# Patient Record
Sex: Female | Born: 1935 | Race: Black or African American | Hispanic: No | Smoking: Former smoker
Health system: Southern US, Community
[De-identification: ages and names within clinical notes are randomized; demographics above are authoritative.]

## PROBLEM LIST (undated history)

## (undated) DIAGNOSIS — I10 Essential (primary) hypertension: Secondary | ICD-10-CM

## (undated) DIAGNOSIS — N6001 Solitary cyst of right breast: Secondary | ICD-10-CM

## (undated) DIAGNOSIS — R011 Cardiac murmur, unspecified: Secondary | ICD-10-CM

## (undated) DIAGNOSIS — K4090 Unilateral inguinal hernia, without obstruction or gangrene, not specified as recurrent: Secondary | ICD-10-CM

## (undated) DIAGNOSIS — M48061 Spinal stenosis, lumbar region without neurogenic claudication: Secondary | ICD-10-CM

## (undated) DIAGNOSIS — I509 Heart failure, unspecified: Secondary | ICD-10-CM

## (undated) DIAGNOSIS — Z9049 Acquired absence of other specified parts of digestive tract: Secondary | ICD-10-CM

## (undated) DIAGNOSIS — R269 Unspecified abnormalities of gait and mobility: Secondary | ICD-10-CM

## (undated) DIAGNOSIS — Z9889 Other specified postprocedural states: Secondary | ICD-10-CM

## (undated) DIAGNOSIS — Z90710 Acquired absence of both cervix and uterus: Secondary | ICD-10-CM

## (undated) DIAGNOSIS — M1990 Unspecified osteoarthritis, unspecified site: Secondary | ICD-10-CM

## (undated) DIAGNOSIS — E785 Hyperlipidemia, unspecified: Secondary | ICD-10-CM

## (undated) DIAGNOSIS — G2581 Restless legs syndrome: Secondary | ICD-10-CM

## (undated) HISTORY — DX: Hyperlipidemia, unspecified: E78.5

## (undated) HISTORY — DX: Unilateral inguinal hernia, without obstruction or gangrene, not specified as recurrent: K40.90

## (undated) HISTORY — DX: Restless legs syndrome: G25.81

## (undated) HISTORY — PX: PEG TUBE REMOVAL: SHX2187

## (undated) HISTORY — PX: TRACHEOSTOMY: SUR1362

## (undated) HISTORY — PX: BREAST CYST EXCISION: SHX579

## (undated) HISTORY — PX: APPENDECTOMY: SHX54

## (undated) HISTORY — DX: Acquired absence of both cervix and uterus: Z90.710

## (undated) HISTORY — DX: Essential (primary) hypertension: I10

## (undated) HISTORY — DX: Other specified postprocedural states: Z98.890

## (undated) HISTORY — PX: ABDOMINAL HYSTERECTOMY: SHX81

## (undated) HISTORY — DX: Acquired absence of other specified parts of digestive tract: Z90.49

## (undated) HISTORY — DX: Unspecified abnormalities of gait and mobility: R26.9

## (undated) HISTORY — DX: Solitary cyst of right breast: N60.01

## (undated) HISTORY — PX: TRACHEOSTOMY CLOSURE: SHX458

## (undated) HISTORY — PX: HERNIA REPAIR: SHX51

## (undated) NOTE — *Deleted (*Deleted)
[facility]   [phone] Fax:(336) [phone]   Clinic Follow up Note   Patient Care Team: Elige Radon, MD as PCP - General (Internal Medicine) Chilton Greathouse, MD as Consulting Physician (Pulmonary Disease) Elinor Parkinson, DPM as Consulting Physician (Podiatry) Eldred Manges, MD as Consulting Physician (Orthopedic Surgery) Radonna Ricker, RN as Oncology Nurse Navigator Pollyann Samples, NP as Nurse Practitioner (Nurse Practitioner) [name], MD as Consulting Physician (Oncology)  Date of Service:  12/12/2019  CHIEF COMPLAINT: ***  SUMMARY OF ONCOLOGIC HISTORY: Oncology History   No history exists.     INTERVAL HISTORY: *** Kristi Alexander is here for a follow up. She presents to the clinic alone.    REVIEW OF SYSTEMS:  *** Constitutional: Denies fevers, chills or abnormal weight loss Eyes: Denies blurriness of vision Ears, nose, mouth, throat, and face: Denies mucositis or sore throat Respiratory: Denies cough, dyspnea or wheezes Cardiovascular: Denies palpitation, chest discomfort or lower extremity swelling Gastrointestinal:  Denies nausea, heartburn or change in bowel habits Skin: Denies abnormal skin rashes Lymphatics: Denies new lymphadenopathy or easy bruising Neurological:Denies numbness, tingling or new weaknesses Behavioral/Psych: Mood is stable, no new changes  All other systems were reviewed with the patient and are negative.  MEDICAL HISTORY:  Past Medical History:  Diagnosis Date  . Abnormality of gait 11/22/2014  . Acute exacerbation of CHF (congestive heart failure) (HCC) 06/23/2018  . Arthritis   . Cyst of breast, right, benign solitary   . Dyslipidemia   . H/O: hysterectomy   . Heart murmur   . History of appendectomy   . History of lumbosacral spine surgery   . Hypertension   . Inguinal hernia   . Lumbar stenosis   . Restless leg syndrome     SURGICAL HISTORY: Past Surgical History:  Procedure Laterality  Date  . ABDOMINAL HYSTERECTOMY    . APPENDECTOMY    . BOWEL RESECTION N/A 03/23/2013   Procedure: SMALL BOWEL RESECTION;  Surgeon: Cherylynn Ridges, MD;  Location: [facility] OR;  Service: General;  Laterality: N/A;  . BREAST CYST EXCISION     LEFT  . ESOPHAGOGASTRODUODENOSCOPY N/A 04/01/2013   Procedure: ESOPHAGOGASTRODUODENOSCOPY (EGD);  Surgeon: Cherylynn Ridges, MD;  Location: [facility] ENDOSCOPY;  Service: General;  Laterality: N/A;  . HERNIA REPAIR    . LAPAROTOMY N/A 03/23/2013   Procedure: EXPLORATORY LAPAROTOMY;  Surgeon: Cherylynn Ridges, MD;  Location: [place] OR;  Service: General;  Laterality: N/A;  . LUMBAR LAMINECTOMY/DECOMPRESSION MICRODISCECTOMY N/A 11/10/2016   Procedure: L4-5 DECOMPRESSION FOR RECURRENT STENOSIS;  Surgeon: Eldred Manges, MD;  Location: [facility] OR;  Service: Orthopedics;  Laterality: N/A;  . PEG PLACEMENT N/A 04/01/2013   Procedure: PERCUTANEOUS ENDOSCOPIC GASTROSTOMY (PEG) PLACEMENT;  Surgeon: Cherylynn Ridges, MD;  Location: [facility] ENDOSCOPY;  Service: General;  Laterality: N/A;  . PEG TUBE REMOVAL    . TRACHEOSTOMY     feinstein  . TRACHEOSTOMY CLOSURE      I have reviewed the social history and family history with the patient and they are unchanged from previous note.  ALLERGIES:  is allergic to penicillins and pravastatin.  MEDICATIONS:  Current Outpatient Medications  Medication Sig Dispense Refill  . acetaminophen (TYLENOL) 500 MG tablet Take 500 mg every 6 (six) hours as needed by mouth (pain).    Marland Kitchen atorvastatin (LIPITOR) 20 MG tablet Take 20 mg by mouth daily.    . benzonatate (TESSALON) 100 MG capsule Take 1 capsule (100 mg total) by mouth  2 (two) times daily as needed for cough. 60 capsule 2  . Calcium Carb-Cholecalciferol (CALCIUM-VITAMIN D) 600-400 MG-UNIT TABS Take 1 tablet by mouth once daily 90 tablet 1  . carvedilol (COREG) 6.25 MG tablet TAKE 1 TABLET BY MOUTH TWICE DAILY WITH A MEAL 60 tablet 2  . diclofenac Sodium (VOLTAREN) 1 % GEL SMARTSIG:2 Gram(s) Topical 4 Times Daily  PRN    . fluticasone (FLONASE) 50 MCG/ACT nasal spray Place 1 spray into both nostrils daily as needed for allergies or rhinitis. 16 g 2  . furosemide (LASIX) 40 MG tablet Take 40 mg by mouth daily.    . hydrOXYzine (ATARAX/VISTARIL) 25 MG tablet TAKE 1 TABLET BY MOUTH ONCE DAILY AS NEEDED FOR  ITCHING 90 tablet 0  . isosorbide mononitrate (IMDUR) 30 MG 24 hr tablet Take 1 tablet by mouth once daily 90 tablet 1  . loratadine (CLARITIN) 10 MG tablet Take 1 tablet (10 mg total) by mouth daily as needed for allergies. 30 tablet 0  . Multiple Vitamin (MULTIVITAMIN WITH MINERALS) TABS tablet Take 1 tablet by mouth daily.    . sacubitril-valsartan (ENTRESTO) 49-51 MG Take 1 tablet by mouth 2 (two) times daily. 60 tablet 3  . spironolactone (ALDACTONE) 50 MG tablet Take 1/2 (one-half) tablet by mouth once daily 90 tablet 0   No current facility-administered medications for this visit.    PHYSICAL EXAMINATION: ECOG PERFORMANCE STATUS: {CHL ONC ECOG PS:[id number]}  There were no vitals filed for this visit. There were no vitals filed for this visit. *** GENERAL:alert, no distress and comfortable SKIN: skin color, texture, turgor are normal, no rashes or significant lesions EYES: normal, Conjunctiva are pink and non-injected, sclera clear {OROPHARYNX:no exudate, no erythema and lips, buccal mucosa, and tongue normal}  NECK: supple, thyroid normal size, non-tender, without nodularity LYMPH:  no palpable lymphadenopathy in the cervical, axillary {or inguinal} LUNGS: clear to auscultation and percussion with normal breathing effort HEART: regular rate & rhythm and no murmurs and no lower extremity edema ABDOMEN:abdomen soft, non-tender and normal bowel sounds Musculoskeletal:no cyanosis of digits and no clubbing  NEURO: alert & oriented x 3 with fluent speech, no focal motor/sensory deficits  LABORATORY DATA:  I have reviewed the data as listed CBC Latest Ref Rng & Units 12/10/2019 11/22/2019  08/25/2019  WBC 4.0 - 10.5 K/uL 6.6 5.0 7.5  Hemoglobin 12.0 - 15.0 g/dL 10.9(L) 11.2(L) 11.3  Hematocrit 36 - 46 % 34.9(L) 35.2(L) 34.4  Platelets 150 - 400 K/uL 308 278 319     CMP Latest Ref Rng & Units 12/10/2019 11/22/2019 08/25/2019  Glucose 70 - 99 mg/dL 409(W) 119(J) 478(G)  BUN 8 - 23 mg/dL 23 16 29   Creatinine 0.44 - 1.00 mg/dL 9.56(O) 1.30(Q) 6.57(Q)  Sodium 135 - 145 mmol/L 140 138 137  Potassium 3.5 - 5.1 mmol/L 4.2 4.0 4.8  Chloride 98 - 111 mmol/L 103 103 99  CO2 22 - 32 mmol/L 28 26 24   Calcium 8.9 - 10.3 mg/dL 10.5(H) 9.9 10.1  Total Protein 6.5 - 8.1 g/dL 6.8 6.5 -  Total Bilirubin 0.3 - 1.2 mg/dL 1.2 1.0 -  Alkaline Phos 38 - 126 U/L 108 92 -  AST 15 - 41 U/L 28 25 -  ALT 0 - 44 U/L 13 13 -      RADIOGRAPHIC STUDIES: I have personally reviewed the radiological images as listed and agreed with the findings in the report. No results found.   ASSESSMENT & PLAN:  Kristi Alexander is a  73 y.o. female with   1.  Left hepatic lobe lesions, suspicious for malignancy -She initially presented with abdominal pain and weight loss, imaging showed 3 liver lesions up to 3 cm in the left hepatic lobe, no obvious primary site on 11/15/19 CT AP or 11/24/19 MRI abodmen. -Work-up shows elevated CEA supporting a GI primary, normal CA 19-9.  She has no history of liver disease or significant alcohol use, AFP is normal so this is likely not primary HCC. -We discussed her furhter workup in great detail. Her CT chest from 12/13/19 showed ***. Her Liver biopsy form 12/13/19 showed ***.  -IIf this does show cancer we will ask for MMR and molecular studies. -We discussed if liver biopsy is not sufficient to determine the primary site she may require additional work-up such as endoscopy or colonoscopy, she is agreeable  2. Co-morbidities: HTN, pulmonary HTN, CHF, CAD, B12 and iron deficiency -Continue follow-up with PCP Dr. Ephriam Knuckles, cardio Dr. Sharyn Lull, pulmonary Dr. Isaiah Serge, and Ortho Dr.  Ophelia Charter -Co-morbidities appear to be well controlled at this time  Plan: ***   No problem-specific Assessment & Plan notes found for this encounter.   No orders of the defined types were placed in this encounter.  All questions were answered. The patient knows to call the clinic with any problems, questions or concerns. No barriers to learning was detected. The total time spent in the appointment was {CHL ONC TIME VISIT - [id number]}.     Delphina Cahill 12/12/2019   Rogelia Rohrer, am acting as scribe for [name], MD.   {Add scribe attestation statement}

---

## 1998-02-26 ENCOUNTER — Emergency Department (HOSPITAL_COMMUNITY): Admission: EM | Admit: 1998-02-26 | Discharge: 1998-02-26 | Payer: Self-pay | Admitting: Emergency Medicine

## 1998-03-14 ENCOUNTER — Encounter: Admission: RE | Admit: 1998-03-14 | Discharge: 1998-04-06 | Payer: Self-pay | Admitting: Orthopaedic Surgery

## 1998-05-08 ENCOUNTER — Other Ambulatory Visit: Admission: RE | Admit: 1998-05-08 | Discharge: 1998-05-08 | Payer: Self-pay | Admitting: Obstetrics and Gynecology

## 1998-10-19 ENCOUNTER — Other Ambulatory Visit: Admission: RE | Admit: 1998-10-19 | Discharge: 1998-10-19 | Payer: Self-pay | Admitting: Obstetrics and Gynecology

## 1998-11-14 ENCOUNTER — Other Ambulatory Visit: Admission: RE | Admit: 1998-11-14 | Discharge: 1998-11-14 | Payer: Self-pay | Admitting: Obstetrics and Gynecology

## 1998-11-14 ENCOUNTER — Encounter (INDEPENDENT_AMBULATORY_CARE_PROVIDER_SITE_OTHER): Payer: Self-pay | Admitting: Specialist

## 1999-11-25 ENCOUNTER — Other Ambulatory Visit: Admission: RE | Admit: 1999-11-25 | Discharge: 1999-11-25 | Payer: Self-pay | Admitting: Obstetrics and Gynecology

## 2000-06-01 ENCOUNTER — Other Ambulatory Visit: Admission: RE | Admit: 2000-06-01 | Discharge: 2000-06-01 | Payer: Self-pay | Admitting: Obstetrics and Gynecology

## 2001-01-15 ENCOUNTER — Other Ambulatory Visit: Admission: RE | Admit: 2001-01-15 | Discharge: 2001-01-15 | Payer: Self-pay | Admitting: Obstetrics and Gynecology

## 2001-05-28 ENCOUNTER — Ambulatory Visit (HOSPITAL_COMMUNITY): Admission: RE | Admit: 2001-05-28 | Discharge: 2001-05-28 | Payer: Self-pay | Admitting: *Deleted

## 2001-05-28 ENCOUNTER — Encounter (INDEPENDENT_AMBULATORY_CARE_PROVIDER_SITE_OTHER): Payer: Self-pay | Admitting: Specialist

## 2001-09-27 ENCOUNTER — Other Ambulatory Visit: Admission: RE | Admit: 2001-09-27 | Discharge: 2001-09-27 | Payer: Self-pay | Admitting: Obstetrics and Gynecology

## 2004-04-10 ENCOUNTER — Ambulatory Visit (HOSPITAL_COMMUNITY): Admission: RE | Admit: 2004-04-10 | Discharge: 2004-04-10 | Payer: Self-pay | Admitting: Family Medicine

## 2004-10-31 ENCOUNTER — Ambulatory Visit: Payer: Self-pay | Admitting: Emergency Medicine

## 2004-11-18 ENCOUNTER — Ambulatory Visit: Payer: Self-pay | Admitting: Emergency Medicine

## 2004-11-22 ENCOUNTER — Ambulatory Visit: Payer: Self-pay | Admitting: Emergency Medicine

## 2004-11-27 ENCOUNTER — Ambulatory Visit: Payer: Self-pay | Admitting: Emergency Medicine

## 2009-09-04 ENCOUNTER — Inpatient Hospital Stay (HOSPITAL_BASED_OUTPATIENT_CLINIC_OR_DEPARTMENT_OTHER): Admission: RE | Admit: 2009-09-04 | Discharge: 2009-09-04 | Payer: Self-pay | Admitting: Cardiology

## 2009-11-01 ENCOUNTER — Encounter: Admission: RE | Admit: 2009-11-01 | Discharge: 2009-11-01 | Payer: Self-pay | Admitting: Orthopaedic Surgery

## 2009-11-15 ENCOUNTER — Inpatient Hospital Stay (HOSPITAL_COMMUNITY): Admission: EM | Admit: 2009-11-15 | Discharge: 2009-11-18 | Payer: Self-pay | Admitting: Orthopaedic Surgery

## 2009-11-16 ENCOUNTER — Encounter: Payer: Self-pay | Admitting: Orthopaedic Surgery

## 2010-05-09 LAB — URINALYSIS, MICROSCOPIC ONLY
Glucose, UA: NEGATIVE mg/dL
Hgb urine dipstick: NEGATIVE
Ketones, ur: 15 mg/dL — AB
Protein, ur: 30 mg/dL — AB
pH: 7 (ref 5.0–8.0)

## 2010-05-09 LAB — CBC
MCH: 24.9 pg — ABNORMAL LOW (ref 26.0–34.0)
MCV: 78 fL (ref 78.0–100.0)
Platelets: 305 10*3/uL (ref 150–400)
RBC: 4.13 MIL/uL (ref 3.87–5.11)
RDW: 18.4 % — ABNORMAL HIGH (ref 11.5–15.5)
WBC: 9.3 10*3/uL (ref 4.0–10.5)

## 2010-05-09 LAB — COMPREHENSIVE METABOLIC PANEL
AST: 28 U/L (ref 0–37)
Albumin: 3.1 g/dL — ABNORMAL LOW (ref 3.5–5.2)
BUN: 9 mg/dL (ref 6–23)
Chloride: 105 mEq/L (ref 96–112)
Creatinine, Ser: 0.86 mg/dL (ref 0.4–1.2)
GFR calc Af Amer: >60 mL/min (ref >60)
Potassium: 3.7 mEq/L (ref 3.5–5.1)
Total Bilirubin: 0.8 mg/dL (ref 0.3–1.2)
Total Protein: 6.4 g/dL (ref 6.0–8.3)

## 2010-05-09 LAB — GLUCOSE, CAPILLARY

## 2011-01-24 ENCOUNTER — Other Ambulatory Visit: Payer: Self-pay | Admitting: Cardiology

## 2012-07-13 ENCOUNTER — Encounter: Payer: Self-pay | Admitting: Nurse Practitioner

## 2012-07-13 DIAGNOSIS — R6889 Other general symptoms and signs: Secondary | ICD-10-CM

## 2012-07-13 DIAGNOSIS — D518 Other vitamin B12 deficiency anemias: Secondary | ICD-10-CM

## 2012-07-13 DIAGNOSIS — R209 Unspecified disturbances of skin sensation: Secondary | ICD-10-CM

## 2012-07-13 DIAGNOSIS — G2581 Restless legs syndrome: Secondary | ICD-10-CM

## 2012-07-13 DIAGNOSIS — D649 Anemia, unspecified: Secondary | ICD-10-CM

## 2012-07-21 ENCOUNTER — Ambulatory Visit (INDEPENDENT_AMBULATORY_CARE_PROVIDER_SITE_OTHER): Payer: Medicare Other | Admitting: Nurse Practitioner

## 2012-07-21 ENCOUNTER — Encounter: Payer: Self-pay | Admitting: Nurse Practitioner

## 2012-07-21 VITALS — BP 153/73 | HR 59 | Ht 62.5 in | Wt 145.0 lb

## 2012-07-21 DIAGNOSIS — G2581 Restless legs syndrome: Secondary | ICD-10-CM

## 2012-07-21 DIAGNOSIS — R209 Unspecified disturbances of skin sensation: Secondary | ICD-10-CM

## 2012-07-21 MED ORDER — PRAMIPEXOLE DIHYDROCHLORIDE 0.5 MG PO TABS: 0.5000 mg | ORAL_TABLET | ORAL | Status: DC

## 2012-07-21 NOTE — Progress Notes (Signed)
HPI: Patient returns for followup after last visit 06/17/2011. She has a history of restless leg syndrome. Nerve conduction studies did not show evidence of a peripheral neuropathy. She is on low-dose Mirapex at night which has improved her symptoms but not completely gone. She denies any falls, any balance issues. She occasionally has shock like pains in the left foot and a crawling sensation. These sensations have improved with Mirapex as well. She takes care of a sick husband 24/7. She denies any compulsive behaviors with Mirapex or other side effects.  No neurologic complaints  ROS:  Incontinence, skin sensitivity, joint pain, numbness, restless legs  Physical Exam General: well developed, well nourished, seated, in no evident distress Head: head normocephalic and atraumatic. Oropharynx benign Neck: supple with no carotid  bruits Cardiovascular: regular rate and rhythm, no murmurs  Neurologic Exam Mental Status: Awake and fully alert. Oriented to place and time. Follows all commands. Speech and language normal.   Cranial Nerves:  Pupils equal, briskly reactive to light. Extraocular movements full without nystagmus. Visual fields full to confrontation. Hearing intact and symmetric to finger snap. Facial sensation intact. Face, tongue, palate move normally and symmetrically. Neck flexion and extension normal.  Motor: Normal bulk and tone. Normal strength in all tested extremity muscles.No focal weekness Sensory.: intact to touch and pinprick and vibratory in all extremities.  Coordination: Rapid alternating movements normal in all extremities. Finger-to-nose and heel-to-shin performed accurately bilaterally. Gait and Station: Arises from chair without difficulty. Stance is normal. Gait demonstrates normal stride length and balance . Able to heel, toe and mildly unsteady with tandem walk. No assistive device.  Reflexes: 1+ and symmetric. Toes downgoing.     ASSESSMENT: Restless leg syndrome  which is not completely controlled.   PLAN: Will increase her Mirapex to 0.5 mg 2 hours before bedtime. Prescription given to patient per her request She will followup in 6 months Nilda Riggs, [facility] APRN

## 2012-07-21 NOTE — Patient Instructions (Addendum)
Will increase Mirapex to 0.5 mg 2 hours before bed Use your current dose of 0.25 taking taking  2  Two hours before bed Followup in 6 months

## 2012-07-21 NOTE — Progress Notes (Signed)
I have read the note, and I agree with the clinical assessment and plan.  

## 2013-01-17 ENCOUNTER — Ambulatory Visit: Payer: Medicare Other | Admitting: Nurse Practitioner

## 2013-01-19 ENCOUNTER — Encounter (INDEPENDENT_AMBULATORY_CARE_PROVIDER_SITE_OTHER): Payer: Self-pay

## 2013-01-19 ENCOUNTER — Encounter: Payer: Self-pay | Admitting: Nurse Practitioner

## 2013-01-19 ENCOUNTER — Ambulatory Visit (INDEPENDENT_AMBULATORY_CARE_PROVIDER_SITE_OTHER): Payer: Medicare Other | Admitting: Nurse Practitioner

## 2013-01-19 VITALS — BP 172/71 | HR 63 | Ht 62.5 in | Wt 144.0 lb

## 2013-01-19 DIAGNOSIS — R209 Unspecified disturbances of skin sensation: Secondary | ICD-10-CM

## 2013-01-19 DIAGNOSIS — G2581 Restless legs syndrome: Secondary | ICD-10-CM

## 2013-01-19 DIAGNOSIS — D518 Other vitamin B12 deficiency anemias: Secondary | ICD-10-CM

## 2013-01-19 MED ORDER — PRAMIPEXOLE DIHYDROCHLORIDE 0.5 MG PO TABS: 0.5000 mg | ORAL_TABLET | Freq: Every day | ORAL | Status: DC

## 2013-01-19 NOTE — Progress Notes (Signed)
[facility]  PATIENT: Kristi Alexander DOB: May 01, 1925   REASON FOR VISIT: Follow up for restless legs    HISTORY OF PRESENT ILLNESS: Kristi Alexander, 79 year old female returns for followup. She has a history of restless leg syndrome is well controlled on Mirapex low-dose. She denies any compulsive behaviors, no falls, no balance issues. She also complains of swelling in her lower extremities when  she has been on her feet all day long. She needs refills on her medication   She has a history of restless leg syndrome. Nerve conduction studies did not show evidence of a peripheral neuropathy. She is on low-dose Mirapex at night which has improved her symptoms but not completely gone. She denies any falls, any balance issues. She occasionally has shock like pains in the left foot and a crawling sensation. These sensations have improved with Mirapex as well. She takes care of a sick husband 24/7. She denies any compulsive behaviors with Mirapex or other side effects. No neurologic complaints   REVIEW OF SYSTEMS: Full 14 system review of systems performed and notable only for: all negative Constitutional: N/A  Cardiovascular: N/A  Ear/Nose/Throat: N/A  Skin: N/A  Eyes: N/A  Respiratory: N/A  Gastroitestinal: N/A  Hematology/Lymphatic: N/A  Endocrine: N/A Musculoskeletal: N/A Allergy/Immunology: N/A  Neurological: N/A Psychiatric: N/A   ALLERGIES: Allergies  Allergen Reactions  . Penicillins Swelling    HOME MEDICATIONS: Outpatient Prescriptions Prior to Visit  Medication Sig Dispense Refill  . aspirin 81 MG tablet Take 81 mg by mouth daily.      . ferrous sulfate 325 (65 FE) MG tablet Take 325 mg by mouth daily with breakfast.      . hydrochlorothiazide (HYDRODIURIL) 25 MG tablet Take 25 mg by mouth daily.      . Metoprolol Tartrate (LOPRESSOR PO) Take by mouth daily.      . pramipexole (MIRAPEX) 0.5 MG tablet Take 1 tablet (0.5 mg total) by mouth daily after  supper.  30 tablet  6  . verapamil (CALAN SR) 240 MG CR tablet Take 240 mg by mouth 2 (two) times daily.       No facility-administered medications prior to visit.    PAST MEDICAL HISTORY: Past Medical History  Diagnosis Date  . Restless leg syndrome   . Hypertension   . Dyslipidemia   . History of appendectomy   . H/O: hysterectomy   . Cyst of breast, right, benign solitary   . Inguinal hernia   . History of lumbosacral spine surgery     PAST SURGICAL HISTORY: History reviewed. No pertinent past surgical history.  FAMILY HISTORY: Family History  Problem Relation Age of Onset  . Heart attack Father   . Lung cancer Brother   . Healthy Sister     SOCIAL HISTORY: History   Social History  . Marital Status: Married    Spouse Name: Lyda Jester     Number of Children: 2  . Years of Education: 12+   Occupational History  . Not on file.   Social History Main Topics  . Smoking status: Current Some Day Smoker  . Smokeless tobacco: Never Used  . Alcohol Use: Yes     Comment: occ  . Drug Use: No  . Sexual Activity: Not on file   Other Topics Concern  . Not on file   Social History Narrative   Patient is married and lives with her husband. The patient is retired. Patient has 2 children and has a college education.  PHYSICAL EXAM  Filed Vitals:   01/19/13 1041  BP: 172/71  Pulse: 63  Height: 5' 2.5" (1.588 m)  Weight: 144 lb (65.318 kg)   Body mass index is 25.9 kg/(m^2).  Generalized: Well developed, in no acute distress  Neurological examination   Mentation: Alert oriented to time, place, history taking. Follows all commands speech and language fluent  Cranial nerve II-XII: Pupils were equal round reactive to light extraocular movements were full, visual field were full on confrontational test. Facial sensation and strength were normal. hearing was intact to finger rubbing bilaterally. Uvula tongue midline. head turning and shoulder shrug and were normal  and symmetric.Tongue protrusion into cheek strength was normal. Motor: normal bulk and tone, full strength in the BUE, BLE, fine finger movements normal, no pronator drift. No focal weakness Sensory: normal and symmetric to light touch, pinprick, and  vibration  Coordination: finger-nose-finger, heel-to-shin bilaterally, no dysmetria Reflexes: 1+ upper lower and symmetric Gait and Station: Rising up from seated position without assistance, normal stance, moderate stride, good arm swing, smooth turning, able to perform tiptoe, and heel walking without difficulty. Mildly unsteady with tandem  DIAGNOSTIC DATA (LABS, IMAGING, TESTING) -None to review   ASSESSMENT AND PLAN  79 y.o. year old female  has a past medical history of Restless leg syndrome; Hypertension; Dyslipidemia; History of lumbosacral spine surgery. here to followup on restless legs which are well controlled with Mirapex 0.5 mg daily at  bedtime  Continue Mirapex at  current dose Will refill for 6 months F/U in 6 months Nilda Riggs, [facility], [facility], APRN  [facility] [address] [place], [place] [address] [phone]

## 2013-01-19 NOTE — Patient Instructions (Addendum)
Continue Mirapex at  current dose Will refill for 6 months F/U in 6 months

## 2013-01-19 NOTE — Progress Notes (Signed)
I have read the note, and I agree with the clinical assessment and plan.  Kristi Alexander,Kristi Alexander   

## 2013-03-22 ENCOUNTER — Encounter (HOSPITAL_COMMUNITY): Payer: Self-pay | Admitting: Emergency Medicine

## 2013-03-22 ENCOUNTER — Inpatient Hospital Stay (HOSPITAL_COMMUNITY)
Admission: EM | Admit: 2013-03-22 | Discharge: 2013-04-19 | DRG: 003 | Disposition: A | Discharge status: Skilled Nursing Facility | Payer: Medicare Other | Attending: Internal Medicine | Admitting: Internal Medicine

## 2013-03-22 ENCOUNTER — Emergency Department (HOSPITAL_COMMUNITY): Payer: Medicare Other

## 2013-03-22 DIAGNOSIS — J9819 Other pulmonary collapse: Secondary | ICD-10-CM | POA: Diagnosis not present

## 2013-03-22 DIAGNOSIS — I10 Essential (primary) hypertension: Secondary | ICD-10-CM

## 2013-03-22 DIAGNOSIS — E876 Hypokalemia: Secondary | ICD-10-CM | POA: Diagnosis not present

## 2013-03-22 DIAGNOSIS — E860 Dehydration: Secondary | ICD-10-CM

## 2013-03-22 DIAGNOSIS — R32 Unspecified urinary incontinence: Secondary | ICD-10-CM | POA: Diagnosis not present

## 2013-03-22 DIAGNOSIS — J9600 Acute respiratory failure, unspecified whether with hypoxia or hypercapnia: Secondary | ICD-10-CM

## 2013-03-22 DIAGNOSIS — N390 Urinary tract infection, site not specified: Secondary | ICD-10-CM | POA: Diagnosis not present

## 2013-03-22 DIAGNOSIS — R6521 Severe sepsis with septic shock: Secondary | ICD-10-CM

## 2013-03-22 DIAGNOSIS — F411 Generalized anxiety disorder: Secondary | ICD-10-CM | POA: Diagnosis present

## 2013-03-22 DIAGNOSIS — E7800 Pure hypercholesterolemia, unspecified: Secondary | ICD-10-CM | POA: Diagnosis present

## 2013-03-22 DIAGNOSIS — Z7982 Long term (current) use of aspirin: Secondary | ICD-10-CM

## 2013-03-22 DIAGNOSIS — D72829 Elevated white blood cell count, unspecified: Secondary | ICD-10-CM

## 2013-03-22 DIAGNOSIS — J90 Pleural effusion, not elsewhere classified: Secondary | ICD-10-CM | POA: Diagnosis present

## 2013-03-22 DIAGNOSIS — E872 Acidosis, unspecified: Secondary | ICD-10-CM | POA: Diagnosis present

## 2013-03-22 DIAGNOSIS — J9620 Acute and chronic respiratory failure, unspecified whether with hypoxia or hypercapnia: Secondary | ICD-10-CM

## 2013-03-22 DIAGNOSIS — R209 Unspecified disturbances of skin sensation: Secondary | ICD-10-CM

## 2013-03-22 DIAGNOSIS — K560 Paralytic ileus: Secondary | ICD-10-CM | POA: Diagnosis not present

## 2013-03-22 DIAGNOSIS — T380X5A Adverse effect of glucocorticoids and synthetic analogues, initial encounter: Secondary | ICD-10-CM | POA: Diagnosis not present

## 2013-03-22 DIAGNOSIS — A419 Sepsis, unspecified organism: Principal | ICD-10-CM

## 2013-03-22 DIAGNOSIS — T17308A Unspecified foreign body in larynx causing other injury, initial encounter: Secondary | ICD-10-CM | POA: Diagnosis not present

## 2013-03-22 DIAGNOSIS — K219 Gastro-esophageal reflux disease without esophagitis: Secondary | ICD-10-CM | POA: Diagnosis present

## 2013-03-22 DIAGNOSIS — I499 Cardiac arrhythmia, unspecified: Secondary | ICD-10-CM | POA: Diagnosis not present

## 2013-03-22 DIAGNOSIS — K55059 Acute (reversible) ischemia of intestine, part and extent unspecified: Secondary | ICD-10-CM | POA: Diagnosis present

## 2013-03-22 DIAGNOSIS — Z930 Tracheostomy status: Secondary | ICD-10-CM

## 2013-03-22 DIAGNOSIS — E785 Hyperlipidemia, unspecified: Secondary | ICD-10-CM | POA: Diagnosis present

## 2013-03-22 DIAGNOSIS — Z9911 Dependence on respirator [ventilator] status: Secondary | ICD-10-CM

## 2013-03-22 DIAGNOSIS — N179 Acute kidney failure, unspecified: Secondary | ICD-10-CM

## 2013-03-22 DIAGNOSIS — R188 Other ascites: Secondary | ICD-10-CM | POA: Diagnosis present

## 2013-03-22 DIAGNOSIS — K56609 Unspecified intestinal obstruction, unspecified as to partial versus complete obstruction: Secondary | ICD-10-CM

## 2013-03-22 DIAGNOSIS — R6520 Severe sepsis without septic shock: Secondary | ICD-10-CM

## 2013-03-22 DIAGNOSIS — R5381 Other malaise: Secondary | ICD-10-CM | POA: Diagnosis present

## 2013-03-22 DIAGNOSIS — I2510 Atherosclerotic heart disease of native coronary artery without angina pectoris: Secondary | ICD-10-CM | POA: Diagnosis present

## 2013-03-22 DIAGNOSIS — K929 Disease of digestive system, unspecified: Secondary | ICD-10-CM | POA: Diagnosis not present

## 2013-03-22 DIAGNOSIS — D649 Anemia, unspecified: Secondary | ICD-10-CM | POA: Diagnosis present

## 2013-03-22 DIAGNOSIS — F17200 Nicotine dependence, unspecified, uncomplicated: Secondary | ICD-10-CM | POA: Diagnosis present

## 2013-03-22 DIAGNOSIS — E1169 Type 2 diabetes mellitus with other specified complication: Secondary | ICD-10-CM | POA: Diagnosis present

## 2013-03-22 DIAGNOSIS — G2581 Restless legs syndrome: Secondary | ICD-10-CM | POA: Diagnosis present

## 2013-03-22 DIAGNOSIS — E46 Unspecified protein-calorie malnutrition: Secondary | ICD-10-CM | POA: Diagnosis present

## 2013-03-22 DIAGNOSIS — N170 Acute kidney failure with tubular necrosis: Secondary | ICD-10-CM | POA: Diagnosis present

## 2013-03-22 DIAGNOSIS — E870 Hyperosmolality and hypernatremia: Secondary | ICD-10-CM | POA: Diagnosis not present

## 2013-03-22 DIAGNOSIS — K659 Peritonitis, unspecified: Secondary | ICD-10-CM | POA: Diagnosis present

## 2013-03-22 LAB — COMPREHENSIVE METABOLIC PANEL
ALBUMIN: 3.3 g/dL — AB (ref 3.5–5.2)
ALK PHOS: 98 U/L (ref 39–117)
ALT: 11 U/L (ref 0–35)
AST: 20 U/L (ref 0–37)
BUN: 15 mg/dL (ref 6–23)
CO2: 23 mEq/L (ref 19–32)
Calcium: 9.8 mg/dL (ref 8.4–10.5)
Chloride: 92 mEq/L — ABNORMAL LOW (ref 96–112)
Creatinine, Ser: 0.93 mg/dL (ref 0.50–1.10)
GFR calc Af Amer: 63 mL/min — ABNORMAL LOW (ref >90)
GFR calc non Af Amer: 54 mL/min — ABNORMAL LOW (ref >90)
Glucose, Bld: 231 mg/dL — ABNORMAL HIGH (ref 70–99)
POTASSIUM: 3.8 meq/L (ref 3.7–5.3)
SODIUM: 138 meq/L (ref 137–147)
TOTAL PROTEIN: 7.8 g/dL (ref 6.0–8.3)
Total Bilirubin: 0.6 mg/dL (ref 0.3–1.2)

## 2013-03-22 LAB — CBC WITH DIFFERENTIAL/PLATELET
BASOS ABS: 0 10*3/uL (ref 0.0–0.1)
BASOS PCT: 0 % (ref 0–1)
EOS ABS: 0 10*3/uL (ref 0.0–0.7)
Eosinophils Relative: 0 % (ref 0–5)
HCT: 38.6 % (ref 36.0–46.0)
Hemoglobin: 12.4 g/dL (ref 12.0–15.0)
Lymphocytes Relative: 6 % — ABNORMAL LOW (ref 12–46)
Lymphs Abs: 0.7 10*3/uL (ref 0.7–4.0)
MCH: 24.4 pg — AB (ref 26.0–34.0)
MCHC: 32.1 g/dL (ref 30.0–36.0)
MCV: 76 fL — ABNORMAL LOW (ref 78.0–100.0)
Monocytes Absolute: 0.8 10*3/uL (ref 0.1–1.0)
Monocytes Relative: 7 % (ref 3–12)
NEUTROS PCT: 88 % — AB (ref 43–77)
Neutro Abs: 11 10*3/uL — ABNORMAL HIGH (ref 1.7–7.7)
PLATELETS: 409 10*3/uL — AB (ref 150–400)
RBC: 5.08 MIL/uL (ref 3.87–5.11)
RDW: 20.3 % — ABNORMAL HIGH (ref 11.5–15.5)
WBC: 12.5 10*3/uL — ABNORMAL HIGH (ref 4.0–10.5)

## 2013-03-22 LAB — PROTIME-INR
INR: 0.93 (ref 0.00–1.49)
PROTHROMBIN TIME: 12.3 s (ref 11.6–15.2)

## 2013-03-22 LAB — URINALYSIS, ROUTINE W REFLEX MICROSCOPIC
GLUCOSE, UA: 100 mg/dL — AB
Hgb urine dipstick: NEGATIVE
KETONES UR: 15 mg/dL — AB
Nitrite: NEGATIVE
PH: 6.5 (ref 5.0–8.0)
Protein, ur: 100 mg/dL — AB
Specific Gravity, Urine: 1.026 (ref 1.005–1.030)
Urobilinogen, UA: 1 mg/dL (ref 0.0–1.0)

## 2013-03-22 LAB — URINE MICROSCOPIC-ADD ON

## 2013-03-22 LAB — CG4 I-STAT (LACTIC ACID): Lactic Acid, Venous: 7.1 mmol/L — ABNORMAL HIGH (ref 0.5–2.2)

## 2013-03-22 MED ORDER — HYDRALAZINE HCL 20 MG/ML IJ SOLN
10.0000 mg | Freq: Four times a day (QID) | INTRAMUSCULAR | Status: DC | PRN
  Administered 2013-04-07 – 2013-04-11 (×2): 10 mg via INTRAVENOUS
  Filled 2013-03-22 (×2): qty 1

## 2013-03-22 MED ORDER — ENOXAPARIN SODIUM 40 MG/0.4ML ~~LOC~~ SOLN
40.0000 mg | SUBCUTANEOUS | Status: DC
  Administered 2013-03-22: 40 mg via SUBCUTANEOUS
  Filled 2013-03-22 (×2): qty 0.4

## 2013-03-22 MED ORDER — MORPHINE SULFATE 2 MG/ML IJ SOLN
2.0000 mg | INTRAMUSCULAR | Status: DC | PRN
  Administered 2013-03-22 – 2013-03-23 (×4): 2 mg via INTRAVENOUS
  Filled 2013-03-22 (×3): qty 1

## 2013-03-22 MED ORDER — IOHEXOL 300 MG/ML  SOLN
25.0000 mL | Freq: Once | INTRAMUSCULAR | Status: AC | PRN
  Administered 2013-03-22: 25 mL via ORAL

## 2013-03-22 MED ORDER — IOHEXOL 300 MG/ML  SOLN
100.0000 mL | Freq: Once | INTRAMUSCULAR | Status: AC | PRN
  Administered 2013-03-22: 100 mL via INTRAVENOUS

## 2013-03-22 MED ORDER — LIDOCAINE VISCOUS 2 % MT SOLN
15.0000 mL | Freq: Once | OROMUCOSAL | Status: AC
  Administered 2013-03-22: 15 mL via OROMUCOSAL
  Filled 2013-03-22: qty 15

## 2013-03-22 MED ORDER — SODIUM CHLORIDE 0.9 % IV SOLN
INTRAVENOUS | Status: DC
Start: 2013-03-22 — End: 2013-03-23
  Administered 2013-03-22: 17:00:00 via INTRAVENOUS

## 2013-03-22 MED ORDER — SODIUM CHLORIDE 0.9 % IV BOLUS (SEPSIS)
500.0000 mL | Freq: Once | INTRAVENOUS | Status: AC
  Administered 2013-03-22: 500 mL via INTRAVENOUS

## 2013-03-22 MED ORDER — METOPROLOL TARTRATE 1 MG/ML IV SOLN
5.0000 mg | Freq: Four times a day (QID) | INTRAVENOUS | Status: DC
  Administered 2013-03-23 (×2): 5 mg via INTRAVENOUS
  Filled 2013-03-22 (×6): qty 5

## 2013-03-22 MED ORDER — SODIUM CHLORIDE 0.9 % IV BOLUS (SEPSIS): 500.0000 mL | INTRAVENOUS | Status: DC

## 2013-03-22 MED ORDER — MORPHINE SULFATE 2 MG/ML IJ SOLN
2.0000 mg | INTRAMUSCULAR | Status: DC | PRN
  Administered 2013-03-22 (×2): 2 mg via INTRAVENOUS
  Filled 2013-03-22 (×2): qty 1

## 2013-03-22 MED ORDER — MORPHINE SULFATE 2 MG/ML IJ SOLN
INTRAMUSCULAR | Status: AC
  Administered 2013-03-22: 2 mg via INTRAVENOUS
  Filled 2013-03-22: qty 1

## 2013-03-22 MED ORDER — SODIUM CHLORIDE 0.9 % IV BOLUS (SEPSIS)
1000.0000 mL | Freq: Once | INTRAVENOUS | Status: AC
  Administered 2013-03-22: 1000 mL via INTRAVENOUS

## 2013-03-22 MED ORDER — ONDANSETRON HCL 4 MG/2ML IJ SOLN
4.0000 mg | Freq: Four times a day (QID) | INTRAMUSCULAR | Status: DC | PRN
  Administered 2013-04-16: 4 mg via INTRAVENOUS
  Filled 2013-03-22 (×2): qty 2

## 2013-03-22 MED ORDER — SODIUM CHLORIDE 0.9 % IV SOLN
INTRAVENOUS | Status: DC
  Administered 2013-03-22: 14:00:00 via INTRAVENOUS

## 2013-03-22 MED ORDER — PANTOPRAZOLE SODIUM 40 MG IV SOLR
40.0000 mg | Freq: Every day | INTRAVENOUS | Status: DC
Start: 2013-03-22 — End: 2013-03-23
  Administered 2013-03-22: 40 mg via INTRAVENOUS
  Filled 2013-03-22 (×2): qty 40

## 2013-03-22 NOTE — ED Notes (Addendum)
Pt c/o lower abd pain, productive cough with white colored mucus, nausea and vomiting starting last night. Pt denies SOB, chest/back pain, dizziness, or fever

## 2013-03-22 NOTE — ED Provider Notes (Signed)
CSN: [id number]     Arrival date & time 03/22/13  1104 History   First MD Initiated Contact with Patient 03/22/13 1316     Chief Complaint  Patient presents with  . Abdominal Pain   (Consider location/radiation/quality/duration/timing/severity/associated sxs/prior Treatment) HPI Patient presents with abdominal pain, nausea, anorexia. Symptoms began one day ago.  Since onset has been progressive, with diffuse severe abdominal pain.  There is associated nausea and anorexia. There has been some emesis. No bowel movements since yesterday. No dyspnea, no chest pain. No clear alleviating or exacerbating factors. Patient has a notable history of hysterectomy in the distant past. She has no history of bowel obstruction, nor diverticulitis.  Past Medical History  Diagnosis Date  . Restless leg syndrome   . Hypertension   . Dyslipidemia   . History of appendectomy   . H/O: hysterectomy   . Cyst of breast, right, benign solitary   . Inguinal hernia   . History of lumbosacral spine surgery    History reviewed. No pertinent past surgical history. Family History  Problem Relation Age of Onset  . Heart attack Father   . Lung cancer Brother   . Healthy Sister    History  Substance Use Topics  . Smoking status: Current Some Day Smoker  . Smokeless tobacco: Never Used  . Alcohol Use: Yes     Comment: occ   OB History   Grav Para Term Preterm Abortions TAB SAB Ect Mult Living                 Review of Systems  Constitutional:       Per HPI, otherwise negative  HENT:       Per HPI, otherwise negative  Respiratory:       Per HPI, otherwise negative  Cardiovascular:       Per HPI, otherwise negative  Gastrointestinal: Positive for nausea, vomiting and abdominal pain.  Endocrine:       Negative aside from HPI  Genitourinary:       Neg aside from HPI   Musculoskeletal:       Per HPI, otherwise negative  Skin: Negative.   Neurological: Negative for syncope.    Allergies   Penicillins  Home Medications   Current Outpatient Rx  Name  Route  Sig  Dispense  Refill  . aspirin 81 MG tablet   Oral   Take 81 mg by mouth daily.         Marland Kitchen atorvastatin (LIPITOR) 20 MG tablet   Oral   Take 20 mg by mouth daily.         . ferrous sulfate 325 (65 FE) MG tablet   Oral   Take 325 mg by mouth daily with breakfast.         . hydrochlorothiazide (HYDRODIURIL) 25 MG tablet   Oral   Take 25 mg by mouth daily.         . Metoprolol Tartrate (LOPRESSOR PO)   Oral   Take by mouth daily.         . pramipexole (MIRAPEX) 0.5 MG tablet   Oral   Take 1 tablet (0.5 mg total) by mouth daily after supper.   30 tablet   6   . verapamil (CALAN SR) 240 MG CR tablet   Oral   Take 240 mg by mouth 2 (two) times daily.          BP 177/99  Pulse 93  Resp 18  SpO2 100% Physical Exam  Nursing note and vitals reviewed. Constitutional: She is oriented to person, place, and time. She appears well-developed and well-nourished. No distress.  HENT:  Head: Normocephalic and atraumatic.  Eyes: Conjunctivae and EOM are normal.  Cardiovascular: Normal rate and regular rhythm.   Pulmonary/Chest: Effort normal and breath sounds normal. No stridor. No respiratory distress.  Abdominal: She exhibits distension. There is generalized tenderness. There is guarding. There is no rigidity.  Musculoskeletal: She exhibits no edema.  Neurological: She is alert and oriented to person, place, and time. No cranial nerve deficit.  Skin: Skin is warm and dry.  Psychiatric: She has a normal mood and affect.    ED Course  NG placement Date/Time: 03/22/2013 5:00 PM Performed by: Carmin Muskrat Authorized by: Carmin Muskrat Consent: Verbal consent obtained. The procedure was performed in an emergent situation. Risks and benefits: risks, benefits and alternatives were discussed Required items: required blood products, implants, devices, and special equipment available Patient  identity confirmed: verbally with patient and hospital-assigned identification number Time out: Immediately prior to procedure a "time out" was called to verify the correct patient, procedure, equipment, support staff and site/side marked as required. Preparation: Patient was prepped and draped in the usual sterile fashion. Local anesthesia used: yes Anesthesia: see MAR for details Anesthetic total: 15 ml Patient sedated: no Patient tolerance: Patient tolerated the procedure well with no immediate complications. Comments: 847ml of brown liquid returned   (including critical care time) Labs Review Labs Reviewed  CBC WITH DIFFERENTIAL - Abnormal; Notable for the following:    WBC 12.5 (*)    MCV 76.0 (*)    MCH 24.4 (*)    RDW 20.3 (*)    Platelets 409 (*)    Neutrophils Relative % 88 (*)    Neutro Abs 11.0 (*)    Lymphocytes Relative 6 (*)    All other components within normal limits  COMPREHENSIVE METABOLIC PANEL  URINALYSIS, ROUTINE W REFLEX MICROSCOPIC   Imaging Review No results found.  EKG Interpretation   None       2:06 PM LA substantially elevated. IVF continue. CT pending.  3:30 PM I reviewed the patient's CT scan, discussed the case with our radiologist.  On repeat exam patient continues to have abdominal pain.  Blood pressure remains stable.  She continues to receive IV fluids.  3:42 PM I have discussed the patient's case with our surgical team.   Update: Patient continues to have pain. MDM    This patient presents with worsening abdominal pain.  Notably, the patient has a history of hysterectomy in the distant past, in all fields versus bowel obstruction versus mesenteric ischemia were immediate considerations. Patient's initial evaluation was notable for demonstration of lactic acidosis she had already been to receive empiric fluids, and these were increased with return of abnormal labs.  Patient did have pain control with morphine. With the  aforementioned concerns and CT scan performed, reviewed these images, and there are consistent with closed loop bowel obstruction, possibly secondary to volvulus.  Patient had an NG tube placed in the emergency department with little change in symptoms, but with production of significant amounts of brown liquid.  After discussion with our surgical team the patient was admitted to the ICU.  On chart review the patient went to the OR for exploratory laparotomy, and had removal of 4 feet of necrotic material.     CRITICAL CARE Performed by: Carmin Muskrat Total critical care time: 40 Critical care time was exclusive of separately billable procedures  and treating other patients. Critical care was necessary to treat or prevent imminent or life-threatening deterioration. Critical care was time spent personally by me on the following activities: development of treatment plan with patient and/or surrogate as well as nursing, discussions with consultants, evaluation of patient's response to treatment, examination of patient, obtaining history from patient or surrogate, ordering and performing treatments and interventions, ordering and review of laboratory studies, ordering and review of radiographic studies, pulse oximetry and re-evaluation of patient's condition.    Carmin Muskrat, MD 03/23/13 1700

## 2013-03-22 NOTE — ED Notes (Signed)
Dr [name] at bedside.  

## 2013-03-22 NOTE — ED Notes (Signed)
Results of lactic acid called to Dr. Vanita Panda

## 2013-03-22 NOTE — H&P (Signed)
HISTORY AND PHYSICAL   Chief Complaint: small bowel obstruction   HPI: Kristi Alexander is a 80 year old female with a history of hypertension, HLD, abdominal hysterectomy, appendectomy who presented to [facility] with abdominal pain. Duration of symptoms is 2 days.  Onset was sudden.  Coarse is worsening.  Time pattern is constant.  Associated with nausea and vomiting.  She had a normal BM yesterday, no recent symptoms of constipation.  Modifying factors include; enema this AM without any relief.  No aggravating or alleviating factors.  She has not eaten or drank anything since last night.  Risk factors include; previous surgeries.  Denies use of anticoagulants.  Denies previous history of obstruction.   In the ED CT showed a high grade closed loop small bowel obstruction with transition point, no free air.  White count 12.5K, lactic acid 7.1.    Past Medical History  Diagnosis Date  . Restless leg syndrome   . Hypertension   . Dyslipidemia   . History of appendectomy   . H/O: hysterectomy   . Cyst of breast, right, benign solitary   . Inguinal hernia   . History of lumbosacral spine surgery     Past Surgical History  Procedure Laterality Date  . Appendectomy    . Abdominal hysterectomy      Family History  Problem Relation Age of Onset  . Heart attack Father   . Lung cancer Brother   . Healthy Sister    Social History:  reports that she has been smoking Cigarettes.  She has been smoking about 0.00 packs per day. She has never used smokeless tobacco. She reports that she drinks alcohol. She reports that she does not use illicit drugs.  Allergies:  Allergies  Allergen Reactions  . Penicillins Swelling   Medication:  Past Medical History  Diagnosis Date  . Restless leg syndrome   . Hypertension   . Dyslipidemia   . History of appendectomy   . H/O: hysterectomy   . Cyst of breast, right, benign solitary   . Inguinal hernia   . History of lumbosacral spine surgery     (Not  in a hospital admission)  Results for orders placed during the hospital encounter of 03/22/13 (from the past 48 hour(s))  CBC WITH DIFFERENTIAL     Status: Abnormal   Collection Time    03/22/13 12:48 PM      Result Value Range   WBC 12.5 (*) 4.0 - 10.5 K/uL   RBC 5.08  3.87 - 5.11 MIL/uL   Hemoglobin 12.4  12.0 - 15.0 g/dL   HCT 38.6  36.0 - 46.0 %   MCV 76.0 (*) 78.0 - 100.0 fL   MCH 24.4 (*) 26.0 - 34.0 pg   MCHC 32.1  30.0 - 36.0 g/dL   RDW 20.3 (*) 11.5 - 15.5 %   Platelets 409 (*) 150 - 400 K/uL   Neutrophils Relative % 88 (*) 43 - 77 %   Neutro Abs 11.0 (*) 1.7 - 7.7 K/uL   Lymphocytes Relative 6 (*) 12 - 46 %   Lymphs Abs 0.7  0.7 - 4.0 K/uL   Monocytes Relative 7  3 - 12 %   Monocytes Absolute 0.8  0.1 - 1.0 K/uL   Eosinophils Relative 0  0 - 5 %   Eosinophils Absolute 0.0  0.0 - 0.7 K/uL   Basophils Relative 0  0 - 1 %   Basophils Absolute 0.0  0.0 - 0.1 K/uL  COMPREHENSIVE METABOLIC  PANEL     Status: Abnormal   Collection Time    03/22/13 12:48 PM      Result Value Range   Sodium 138  137 - 147 mEq/L   Potassium 3.8  3.7 - 5.3 mEq/L   Chloride 92 (*) 96 - 112 mEq/L   CO2 23  19 - 32 mEq/L   Glucose, Bld 231 (*) 70 - 99 mg/dL   BUN 15  6 - 23 mg/dL   Creatinine, Ser 6.50  0.50 - 1.10 mg/dL   Calcium 9.8  8.4 - 52.5 mg/dL   Total Protein 7.8  6.0 - 8.3 g/dL   Albumin 3.3 (*) 3.5 - 5.2 g/dL   AST 20  0 - 37 U/L   ALT 11  0 - 35 U/L   Alkaline Phosphatase 98  39 - 117 U/L   Total Bilirubin 0.6  0.3 - 1.2 mg/dL   GFR calc non Af Amer 54 (*) >90 mL/min   GFR calc Af Amer 63 (*) >90 mL/min   Comment: (NOTE)     The eGFR has been calculated using the CKD EPI equation.     This calculation has not been validated in all clinical situations.     eGFR's persistently <90 mL/min signify possible Chronic Kidney     Disease.  URINALYSIS, ROUTINE W REFLEX MICROSCOPIC     Status: Abnormal   Collection Time    03/22/13  1:56 PM      Result Value Range   Color, Urine  AMBER (*) YELLOW   Comment: BIOCHEMICALS MAY BE AFFECTED BY COLOR   APPearance HAZY (*) CLEAR   Specific Gravity, Urine 1.026  1.005 - 1.030   pH 6.5  5.0 - 8.0   Glucose, UA 100 (*) NEGATIVE mg/dL   Hgb urine dipstick NEGATIVE  NEGATIVE   Bilirubin Urine SMALL (*) NEGATIVE   Ketones, ur 15 (*) NEGATIVE mg/dL   Protein, ur 798 (*) NEGATIVE mg/dL   Urobilinogen, UA 1.0  0.0 - 1.0 mg/dL   Nitrite NEGATIVE  NEGATIVE   Leukocytes, UA TRACE (*) NEGATIVE  URINE MICROSCOPIC-ADD ON     Status: Abnormal   Collection Time    03/22/13  1:56 PM      Result Value Range   Squamous Epithelial / LPF FEW (*) RARE   WBC, UA 3-6  <3 WBC/hpf   RBC / HPF 0-2  <3 RBC/hpf   Bacteria, UA FEW (*) RARE   Casts HYALINE CASTS (*) NEGATIVE   Urine-Other MUCOUS PRESENT    CG4 I-STAT (LACTIC ACID)     Status: Abnormal   Collection Time    03/22/13  2:06 PM      Result Value Range   Lactic Acid, Venous 7.10 (*) 0.5 - 2.2 mmol/L   Ct Abdomen Pelvis W Contrast  03/22/2013   CLINICAL DATA:  Diffuse abdominal pain, nausea and vomiting  EXAM: CT ABDOMEN AND PELVIS WITH CONTRAST  TECHNIQUE: Multidetector CT imaging of the abdomen and pelvis was performed using the standard protocol following bolus administration of intravenous contrast.  CONTRAST:  OMNIPAQUE IOHEXOL 300 MG/ML  SOLN  COMPARISON:  Lumbar spine MRI 10/19/2009  FINDINGS: Lower Chest: Mild subpleural reticulation noted in the lung bases. Additionally, there is some mild dependent atelectasis. No definite pulmonary nodule or focal consolidation. Visualized cardiac structures within normal limits for size. Mildly patulous distal esophagus containing ingested oral contrast material.  Abdomen: Patulous and mildly distended stomach. No focal mass lesion or wall  thickening. Unremarkable duodenum, spleen and right adrenal gland. 1.7 x 2.0 cm enhancing nodule arising from the lateral limb of the left adrenal gland. This is nonspecific by CT appearance. Diffuse low  attenuation of the hepatic parenchyma suggesting fatty infiltration. 7 mm well circumscribed hypo attenuating lesion in the inferior aspect of hepatic segment 5 is too small for accurate characterization but statistically highly likely a benign cyst. No additional discrete lesions identified. Gallbladder is unremarkable. No intra or extrahepatic biliary ductal dilatation. Colonic interposition noted incidentally anterior to the colon. Additionally, there is a small amount of low-attenuation perihepatic ascites. Small ascites is also noted in the left subdiaphragmatic station adjacent to the greater curvature of the stomach.  No hydronephrosis or nephrolithiasis. No enhancing renal mass. Several small left renal cortical defects suggest the sequelae of prior scarring.  Colonic diverticular disease without CT evidence of active inflammation. The colon is largely decompressed. Several loops of abnormal dilated fluid-filled an edematous small bowel noted in the mid abdomen. There is associated edema and fluid within the mesentery and between the bowel loops. There are 2 focal transition points within the center of the collection of abnormal bowel loops best demonstrated on the coronal (series 4) images. Transition points are noted on image 27 and 35 of series 4. Findings are most consistent with a closed loop obstruction. There is persistent enhancement of the bowel mucosa.  Pelvis: Moderate volume of free fluid in the anatomic pelvis. Surgical changes of prior hysterectomy.  Bones/Soft Tissues: No acute fracture or aggressive appearing lytic or blastic osseous lesion. Extensive multilevel degenerative disc disease and lower lumbar facet arthropathy.  Vascular: Extensive atherosclerotic vascular disease. No aneurysmal dilatation. Probable right renal and right external iliac stenoses.  IMPRESSION: 1. CT findings are consistent with high-grade closed loop small bowel obstruction. Two adjacent transition points noted the  mid abdomen. Differential considerations include volvulus and internal hernia. Associated significant focal mesenteric edema, interloop fluid and moderate free peritoneal fluid. The affected small bowel mucosa does continue to enhance. No free air, pneumatosis or portal venous gas. Recommend surgical consultation. 2. Colonic diverticular disease without CT evidence of active inflammation. 3. Nonspecific 2 cm enhancing left adrenal nodule. Statistically, this is likely a benign adenoma, however it is indeterminate by imaging and could represent a metastatic lesion if the patient has a known primary malignancy. If clinically warranted, adrenal protocol CT or MRI could further evaluate. 4. Hepatic steatosis. 5. Extensive multilevel degenerative disc disease and lower lumbar facet arthropathy. 6. Extensive atherosclerotic vascular disease with probable right renal and right external iliac stenoses. 7. Mild subpleural reticulation in the visualized lower lungs suggests early pulmonary fibrosis. Critical Value/emergent results were called by telephone at the time of interpretation on 03/22/2013 at 3:33 PM to Dr. Carmin Muskrat , who verbally acknowledged these results.   Electronically Signed   By: Jacqulynn Cadet M.D.   On: 03/22/2013 15:34    Review of Systems  Constitutional: Negative for fever, chills, weight loss, malaise/fatigue and diaphoresis.  Respiratory: Negative for cough and shortness of breath.   Cardiovascular: Negative for chest pain, palpitations, orthopnea, claudication and leg swelling.  Gastrointestinal: Positive for nausea, vomiting and abdominal pain. Negative for diarrhea, blood in stool and melena.  Genitourinary: Negative for dysuria, urgency, hematuria and flank pain.  Musculoskeletal: Negative for back pain.  Neurological: Negative for dizziness, tingling, tremors, seizures, loss of consciousness, weakness and headaches.    Blood pressure 156/85, pulse 95, resp. rate 18, SpO2  100.00%. Physical Exam  Constitutional: She is oriented to person, place, and time. She appears well-developed and well-nourished. She appears distressed.  HENT:  Head: Normocephalic and atraumatic.  Neck: Normal range of motion. Neck supple.  Cardiovascular: Normal rate, regular rhythm, normal heart sounds and intact distal pulses.  Exam reveals no gallop and no friction rub.   No murmur heard. Respiratory: Effort normal and breath sounds normal. No respiratory distress. She has no wheezes. She has no rales. She exhibits no tenderness.  GI: She exhibits no mass. There is no rebound and no guarding.  Hypoactive bowel sounds, diffuse tenderness without evidence of peritonitis or incarcerated hernias.  She has a infraumbilical vertical scar.    Musculoskeletal: Normal range of motion. She exhibits no edema and no tenderness.  Lymphadenopathy:    She has no cervical adenopathy.  Neurological: She is alert and oriented to person, place, and time.  Skin: Skin is dry. No rash noted. She is not diaphoretic. No erythema. No pallor.  Poor skin turgor  Psychiatric: She has a normal mood and affect. Her behavior is normal. Thought content normal.     Assessment/Plan High grade small bowel obstruction -admit to inpatient for NGT decompression, bowel rest, IV fluids.  Hopefully she will improve with non surgical management.  She will need to be monitored closely for worsening symptoms overnight.  We will admit her to stepdown unit.  If she does not improve in 1-2 days we will proceed with exploratory laparotomy.   -pain control -antiemetics -monitor hemoglobin, NGT output as it appears she has coffee ground content. -obtain PT/INR -repeat lactic acid in AM along with CBC and BMP Dehydration -give 500cc fluid bolus, IVF at 134m/hr, monitor output and rehydrate aggressively with consideration of age(no known chf, cad). Hypertension -IV metoprolol scheduled, PRN hydralazine  Lactic acidosis -likely  due to #2, repeat in AM Leukocytosis -likely due to #2, repeat in AM. 2cm left adrenal nodule -she will need outpatient follow up  Glyn Zendejas  ANP-BC  03/22/2013, 3:50 PM

## 2013-03-22 NOTE — H&P (Signed)
The patient is volume depleted based on decreased skin turgor, hemoconcentration (her normal Hgb is about 10), and poor urine output.  NGT contents were bloody, but old dark blood.  High lactic acid level also likely from volume depletion.  Abdomen much better after NGT.   Will volume resuscitate and decompress and admit to SDU.  Kathryne Eriksson. Dahlia Bailiff, MD, [place] [phone] [facility]

## 2013-03-23 ENCOUNTER — Encounter (HOSPITAL_COMMUNITY): Admission: EM | Disposition: A | Discharge status: Skilled Nursing Facility | Payer: Self-pay | Source: Home / Self Care

## 2013-03-23 ENCOUNTER — Inpatient Hospital Stay (HOSPITAL_COMMUNITY): Payer: Medicare Other

## 2013-03-23 ENCOUNTER — Inpatient Hospital Stay (HOSPITAL_COMMUNITY): Payer: Medicare Other | Admitting: Anesthesiology

## 2013-03-23 ENCOUNTER — Encounter (HOSPITAL_COMMUNITY): Payer: Medicare Other | Admitting: Anesthesiology

## 2013-03-23 DIAGNOSIS — J9600 Acute respiratory failure, unspecified whether with hypoxia or hypercapnia: Secondary | ICD-10-CM

## 2013-03-23 DIAGNOSIS — K56609 Unspecified intestinal obstruction, unspecified as to partial versus complete obstruction: Secondary | ICD-10-CM

## 2013-03-23 DIAGNOSIS — A419 Sepsis, unspecified organism: Secondary | ICD-10-CM

## 2013-03-23 HISTORY — PX: LAPAROTOMY: SHX154

## 2013-03-23 HISTORY — PX: BOWEL RESECTION: SHX1257

## 2013-03-23 LAB — GLUCOSE, CAPILLARY
GLUCOSE-CAPILLARY: 172 mg/dL — AB (ref 70–99)
Glucose-Capillary: 208 mg/dL — ABNORMAL HIGH (ref 70–99)

## 2013-03-23 LAB — BASIC METABOLIC PANEL
BUN: 26 mg/dL — ABNORMAL HIGH (ref 6–23)
BUN: 31 mg/dL — ABNORMAL HIGH (ref 6–23)
CALCIUM: 7.3 mg/dL — AB (ref 8.4–10.5)
CHLORIDE: 105 meq/L (ref 96–112)
CO2: 17 mEq/L — ABNORMAL LOW (ref 19–32)
CO2: 20 mEq/L (ref 19–32)
CREATININE: 1.89 mg/dL — AB (ref 0.50–1.10)
CREATININE: 2.56 mg/dL — AB (ref 0.50–1.10)
Calcium: 8.7 mg/dL (ref 8.4–10.5)
Chloride: 102 mEq/L (ref 96–112)
GFR, EST AFRICAN AMERICAN: 27 mL/min — AB (ref >90)
GFR, EST AFRICAN AMERICAN: 19 mL/min — AB (ref >90)
GFR, EST NON AFRICAN AMERICAN: 23 mL/min — AB (ref >90)
GFR, EST NON AFRICAN AMERICAN: 16 mL/min — AB (ref >90)
Glucose, Bld: 164 mg/dL — ABNORMAL HIGH (ref 70–99)
Glucose, Bld: 100 mg/dL — ABNORMAL HIGH (ref 70–99)
Potassium: 4.9 mEq/L (ref 3.7–5.3)
Potassium: 4.2 mEq/L (ref 3.7–5.3)
Sodium: 139 mEq/L (ref 137–147)
Sodium: 139 mEq/L (ref 137–147)

## 2013-03-23 LAB — CBC
HCT: 36.2 % (ref 36.0–46.0)
HCT: 26.6 % — ABNORMAL LOW (ref 36.0–46.0)
Hemoglobin: 11.9 g/dL — ABNORMAL LOW (ref 12.0–15.0)
Hemoglobin: 8.8 g/dL — ABNORMAL LOW (ref 12.0–15.0)
MCH: 25 pg — ABNORMAL LOW (ref 26.0–34.0)
MCH: 24.9 pg — AB (ref 26.0–34.0)
MCHC: 32.9 g/dL (ref 30.0–36.0)
MCHC: 33.1 g/dL (ref 30.0–36.0)
MCV: 76.1 fL — ABNORMAL LOW (ref 78.0–100.0)
MCV: 75.4 fL — AB (ref 78.0–100.0)
PLATELETS: 330 10*3/uL (ref 150–400)
PLATELETS: 235 10*3/uL (ref 150–400)
RBC: 4.76 MIL/uL (ref 3.87–5.11)
RBC: 3.53 MIL/uL — ABNORMAL LOW (ref 3.87–5.11)
RDW: 20.5 % — AB (ref 11.5–15.5)
RDW: 20 % — AB (ref 11.5–15.5)
WBC: 18.1 10*3/uL — AB (ref 4.0–10.5)
WBC: 15 10*3/uL — ABNORMAL HIGH (ref 4.0–10.5)

## 2013-03-23 LAB — SURGICAL PCR SCREEN
MRSA, PCR: NEGATIVE
Staphylococcus aureus: POSITIVE — AB

## 2013-03-23 LAB — POCT I-STAT 3, ART BLOOD GAS (G3+)
ACID-BASE DEFICIT: 4 mmol/L — AB (ref 0.0–2.0)
Bicarbonate: 21.6 mEq/L (ref 20.0–24.0)
O2 Saturation: 100 %
PH ART: 7.355 (ref 7.350–7.450)
TCO2: 23 mmol/L (ref 0–100)
pCO2 arterial: 38.7 mmHg (ref 35.0–45.0)
pO2, Arterial: 256 mmHg — ABNORMAL HIGH (ref 80.0–100.0)

## 2013-03-23 LAB — LACTIC ACID, PLASMA
LACTIC ACID, VENOUS: 2.1 mmol/L (ref 0.5–2.2)
Lactic Acid, Venous: 5.5 mmol/L — ABNORMAL HIGH (ref 0.5–2.2)

## 2013-03-23 LAB — URINE CULTURE: Colony Count: 50000

## 2013-03-23 LAB — ABO/RH: ABO/RH(D): O POS

## 2013-03-23 SURGERY — LAPAROTOMY, EXPLORATORY
Anesthesia: General | Site: Abdomen

## 2013-03-23 MED ORDER — LIDOCAINE HCL (CARDIAC) 20 MG/ML IV SOLN
INTRAVENOUS | Status: AC
  Filled 2013-03-23: qty 5

## 2013-03-23 MED ORDER — PHENYLEPHRINE 40 MCG/ML (10ML) SYRINGE FOR IV PUSH (FOR BLOOD PRESSURE SUPPORT)
PREFILLED_SYRINGE | INTRAVENOUS | Status: AC
  Filled 2013-03-23: qty 10

## 2013-03-23 MED ORDER — ENOXAPARIN SODIUM 30 MG/0.3ML ~~LOC~~ SOLN
30.0000 mg | SUBCUTANEOUS | Status: DC
  Administered 2013-03-24 – 2013-04-04 (×12): 30 mg via SUBCUTANEOUS
  Filled 2013-03-23 (×13): qty 0.3

## 2013-03-23 MED ORDER — POVIDONE-IODINE 10 % EX OINT
TOPICAL_OINTMENT | CUTANEOUS | Status: AC
  Filled 2013-03-23: qty 28.35

## 2013-03-23 MED ORDER — SODIUM CHLORIDE 0.9 % IV BOLUS (SEPSIS)
1000.0000 mL | Freq: Once | INTRAVENOUS | Status: AC
  Administered 2013-03-23: 1000 mL via INTRAVENOUS

## 2013-03-23 MED ORDER — MIDAZOLAM HCL 2 MG/2ML IJ SOLN
INTRAMUSCULAR | Status: AC
  Filled 2013-03-23: qty 2

## 2013-03-23 MED ORDER — ETOMIDATE 2 MG/ML IV SOLN
INTRAVENOUS | Status: AC
  Filled 2013-03-23: qty 10

## 2013-03-23 MED ORDER — ALBUMIN HUMAN 5 % IV SOLN
INTRAVENOUS | Status: DC | PRN
  Administered 2013-03-23 (×2): via INTRAVENOUS

## 2013-03-23 MED ORDER — PROPOFOL 10 MG/ML IV BOLUS
INTRAVENOUS | Status: AC
  Filled 2013-03-23: qty 20

## 2013-03-23 MED ORDER — SODIUM CHLORIDE 0.9 % IJ SOLN
INTRAMUSCULAR | Status: AC
  Filled 2013-03-23: qty 10

## 2013-03-23 MED ORDER — SODIUM CHLORIDE 0.9 % IV BOLUS (SEPSIS): 1000.0000 mL | Freq: Once | INTRAVENOUS | Status: AC

## 2013-03-23 MED ORDER — SUCCINYLCHOLINE CHLORIDE 20 MG/ML IJ SOLN
INTRAMUSCULAR | Status: AC
  Filled 2013-03-23: qty 1

## 2013-03-23 MED ORDER — CHLORHEXIDINE GLUCONATE CLOTH 2 % EX PADS
6.0000 | MEDICATED_PAD | Freq: Every day | CUTANEOUS | Status: AC
  Administered 2013-03-23 – 2013-03-27 (×5): 6 via TOPICAL

## 2013-03-23 MED ORDER — LIDOCAINE HCL (CARDIAC) 20 MG/ML IV SOLN
INTRAVENOUS | Status: DC | PRN
  Administered 2013-03-23: 40 mg via INTRAVENOUS

## 2013-03-23 MED ORDER — PANTOPRAZOLE SODIUM 40 MG IV SOLR
40.0000 mg | Freq: Two times a day (BID) | INTRAVENOUS | Status: DC
Start: 2013-03-23 — End: 2013-04-05
  Administered 2013-03-23 – 2013-04-04 (×25): 40 mg via INTRAVENOUS
  Filled 2013-03-23 (×31): qty 40

## 2013-03-23 MED ORDER — CIPROFLOXACIN IN D5W 400 MG/200ML IV SOLN
400.0000 mg | INTRAVENOUS | Status: AC
  Administered 2013-03-23: 400 mg via INTRAVENOUS
  Filled 2013-03-23: qty 200

## 2013-03-23 MED ORDER — SUCCINYLCHOLINE CHLORIDE 20 MG/ML IJ SOLN
INTRAMUSCULAR | Status: DC | PRN
  Administered 2013-03-23: 100 mg via INTRAVENOUS

## 2013-03-23 MED ORDER — POVIDONE-IODINE 10 % EX OINT
TOPICAL_OINTMENT | CUTANEOUS | Status: DC | PRN
  Administered 2013-03-23: 1 via TOPICAL

## 2013-03-23 MED ORDER — MORPHINE SULFATE 2 MG/ML IJ SOLN
1.0000 mg | INTRAMUSCULAR | Status: DC | PRN
  Administered 2013-03-22 – 2013-03-23 (×2): 2 mg via INTRAVENOUS
  Administered 2013-03-26: 1 mg via INTRAVENOUS
  Administered 2013-04-03 – 2013-04-07 (×4): 2 mg via INTRAVENOUS
  Filled 2013-03-23 (×6): qty 1

## 2013-03-23 MED ORDER — ONDANSETRON HCL 4 MG/2ML IJ SOLN
INTRAMUSCULAR | Status: AC
  Filled 2013-03-23: qty 2

## 2013-03-23 MED ORDER — METRONIDAZOLE IN NACL 5-0.79 MG/ML-% IV SOLN
500.0000 mg | INTRAVENOUS | Status: AC
  Administered 2013-03-23: .5 g via INTRAVENOUS
  Filled 2013-03-23: qty 100

## 2013-03-23 MED ORDER — ROCURONIUM BROMIDE 100 MG/10ML IV SOLN
INTRAVENOUS | Status: DC | PRN
  Administered 2013-03-23: 20 mg via INTRAVENOUS
  Administered 2013-03-23: 30 mg via INTRAVENOUS

## 2013-03-23 MED ORDER — FENTANYL CITRATE 0.05 MG/ML IJ SOLN
INTRAMUSCULAR | Status: DC | PRN
  Administered 2013-03-23: 100 ug via INTRAVENOUS
  Administered 2013-03-23: 150 ug via INTRAVENOUS

## 2013-03-23 MED ORDER — 0.9 % SODIUM CHLORIDE (POUR BTL) OPTIME
TOPICAL | Status: DC | PRN
  Administered 2013-03-23: 1000 mL

## 2013-03-23 MED ORDER — SODIUM CHLORIDE 0.9 % IV BOLUS (SEPSIS)
500.0000 mL | Freq: Once | INTRAVENOUS | Status: AC
  Administered 2013-03-23: 500 mL via INTRAVENOUS

## 2013-03-23 MED ORDER — SODIUM CHLORIDE 0.9 % IV SOLN
INTRAVENOUS | Status: DC
  Administered 2013-03-23: 125 mL/h via INTRAVENOUS
  Administered 2013-03-24 (×2): via INTRAVENOUS

## 2013-03-23 MED ORDER — FENTANYL CITRATE 0.05 MG/ML IJ SOLN
INTRAMUSCULAR | Status: AC
  Filled 2013-03-23: qty 5

## 2013-03-23 MED ORDER — MIDAZOLAM HCL 5 MG/5ML IJ SOLN
INTRAMUSCULAR | Status: DC | PRN
  Administered 2013-03-23: 2 mg via INTRAVENOUS

## 2013-03-23 MED ORDER — ENOXAPARIN SODIUM 40 MG/0.4ML ~~LOC~~ SOLN: 40.0000 mg | SUBCUTANEOUS | Status: DC

## 2013-03-23 MED ORDER — LACTATED RINGERS IV SOLN
INTRAVENOUS | Status: DC | PRN
  Administered 2013-03-23 (×2): via INTRAVENOUS

## 2013-03-23 MED ORDER — PHENYLEPHRINE HCL 10 MG/ML IJ SOLN
10.0000 mg | INTRAMUSCULAR | Status: DC | PRN
  Administered 2013-03-23: 25 ug/min via INTRAVENOUS

## 2013-03-23 MED ORDER — FENTANYL CITRATE 0.05 MG/ML IJ SOLN
25.0000 ug | INTRAMUSCULAR | Status: DC | PRN
  Administered 2013-03-23: 25 ug via INTRAVENOUS
  Administered 2013-03-24: 50 ug via INTRAVENOUS
  Administered 2013-03-24 (×2): 25 ug via INTRAVENOUS
  Administered 2013-03-24 – 2013-03-25 (×3): 50 ug via INTRAVENOUS
  Administered 2013-03-26 (×2): 25 ug via INTRAVENOUS
  Administered 2013-03-26 (×2): 50 ug via INTRAVENOUS
  Administered 2013-03-27: 100 ug via INTRAVENOUS
  Administered 2013-03-27: 25 ug via INTRAVENOUS
  Administered 2013-03-27 (×2): 50 ug via INTRAVENOUS
  Administered 2013-03-27: 100 ug via INTRAVENOUS
  Administered 2013-03-27 – 2013-03-30 (×12): 50 ug via INTRAVENOUS
  Administered 2013-03-30: 100 ug via INTRAVENOUS
  Administered 2013-03-30: 50 ug via INTRAVENOUS
  Administered 2013-03-30: 100 ug via INTRAVENOUS
  Filled 2013-03-23 (×26): qty 2

## 2013-03-23 MED ORDER — CIPROFLOXACIN IN D5W 400 MG/200ML IV SOLN
400.0000 mg | INTRAVENOUS | Status: DC
  Administered 2013-03-24 – 2013-04-01 (×9): 400 mg via INTRAVENOUS
  Filled 2013-03-23 (×10): qty 200

## 2013-03-23 MED ORDER — EPHEDRINE SULFATE 50 MG/ML IJ SOLN
INTRAMUSCULAR | Status: AC
  Filled 2013-03-23: qty 1

## 2013-03-23 MED ORDER — ETOMIDATE 2 MG/ML IV SOLN
INTRAVENOUS | Status: DC | PRN
  Administered 2013-03-23: 14 mg via INTRAVENOUS

## 2013-03-23 MED ORDER — ROCURONIUM BROMIDE 50 MG/5ML IV SOLN
INTRAVENOUS | Status: AC
  Filled 2013-03-23: qty 1

## 2013-03-23 MED ORDER — MUPIROCIN 2 % EX OINT
1.0000 "application " | TOPICAL_OINTMENT | Freq: Two times a day (BID) | CUTANEOUS | Status: AC
  Administered 2013-03-23 – 2013-03-27 (×10): 1 via NASAL
  Filled 2013-03-23: qty 22

## 2013-03-23 MED ORDER — METRONIDAZOLE IN NACL 5-0.79 MG/ML-% IV SOLN
500.0000 mg | Freq: Three times a day (TID) | INTRAVENOUS | Status: DC
  Administered 2013-03-23 – 2013-04-02 (×29): 500 mg via INTRAVENOUS
  Filled 2013-03-23 (×33): qty 100

## 2013-03-23 SURGICAL SUPPLY — 62 items
BLADE SURG ROTATE 9660 (MISCELLANEOUS) IMPLANT
CANISTER SUCTION 2500CC (MISCELLANEOUS) ×4 IMPLANT
CHLORAPREP W/TINT 26ML (MISCELLANEOUS) ×4 IMPLANT
CLIP TI LARGE 6 (CLIP) ×4 IMPLANT
CLIP TI MEDIUM 24 (CLIP) ×4 IMPLANT
COVER MAYO STAND STRL (DRAPES) IMPLANT
COVER SURGICAL LIGHT HANDLE (MISCELLANEOUS) ×4 IMPLANT
COVER TABLE BACK 60X90 (DRAPES) ×4 IMPLANT
DRAPE LAPAROSCOPIC ABDOMINAL (DRAPES) ×8 IMPLANT
DRAPE PROXIMA HALF (DRAPES) IMPLANT
DRAPE UTILITY 15X26 W/TAPE STR (DRAPE) ×8 IMPLANT
DRAPE WARM FLUID 44X44 (DRAPE) ×4 IMPLANT
DRSG OPSITE POSTOP 4X10 (GAUZE/BANDAGES/DRESSINGS) IMPLANT
DRSG OPSITE POSTOP 4X8 (GAUZE/BANDAGES/DRESSINGS) IMPLANT
ELECT BLADE 6.5 EXT (BLADE) IMPLANT
ELECT CAUTERY BLADE 6.4 (BLADE) ×8 IMPLANT
ELECT REM PT RETURN 9FT ADLT (ELECTROSURGICAL) ×4
ELECTRODE REM PT RTRN 9FT ADLT (ELECTROSURGICAL) ×2 IMPLANT
GAUZE SPONGE 2X2 8PLY STRL LF (GAUZE/BANDAGES/DRESSINGS) ×2 IMPLANT
GLOVE BIO SURGEON STRL SZ7 (GLOVE) ×4 IMPLANT
GLOVE BIOGEL PI IND STRL 6 (GLOVE) ×6 IMPLANT
GLOVE BIOGEL PI IND STRL 7.0 (GLOVE) ×6 IMPLANT
GLOVE BIOGEL PI IND STRL 8 (GLOVE) ×6 IMPLANT
GLOVE BIOGEL PI INDICATOR 6 (GLOVE) ×6
GLOVE BIOGEL PI INDICATOR 7.0 (GLOVE) ×6
GLOVE BIOGEL PI INDICATOR 8 (GLOVE) ×6
GLOVE ECLIPSE 7.5 STRL STRAW (GLOVE) ×8 IMPLANT
GOWN STRL NON-REIN LRG LVL3 (GOWN DISPOSABLE) ×16 IMPLANT
KIT BASIN OR (CUSTOM PROCEDURE TRAY) ×4 IMPLANT
KIT ROOM TURNOVER OR (KITS) ×4 IMPLANT
LIGASURE IMPACT 36 18CM CVD LR (INSTRUMENTS) ×4 IMPLANT
NS IRRIG 1000ML POUR BTL (IV SOLUTION) ×8 IMPLANT
PACK GENERAL/GYN (CUSTOM PROCEDURE TRAY) ×4 IMPLANT
PAD ARMBOARD 7.5X6 YLW CONV (MISCELLANEOUS) ×4 IMPLANT
PENCIL BUTTON HOLSTER BLD 10FT (ELECTRODE) IMPLANT
RELOAD PROXIMATE 75MM BLUE (ENDOMECHANICALS) ×8 IMPLANT
SEPRAFILM PROCEDURAL PACK 3X5 (MISCELLANEOUS) ×4 IMPLANT
SPECIMEN JAR LARGE (MISCELLANEOUS) ×4 IMPLANT
SPONGE GAUZE 2X2 STER 10/PKG (GAUZE/BANDAGES/DRESSINGS) ×2
SPONGE GAUZE 4X4 12PLY STER LF (GAUZE/BANDAGES/DRESSINGS) ×4 IMPLANT
SPONGE LAP 18X18 X RAY DECT (DISPOSABLE) IMPLANT
STAPLER GUN LINEAR PROX 60 (STAPLE) ×4 IMPLANT
STAPLER PROXIMATE 75MM BLUE (STAPLE) ×4 IMPLANT
STAPLER VISISTAT 35W (STAPLE) ×4 IMPLANT
SUCTION POOLE TIP (SUCTIONS) ×4 IMPLANT
SUT NOVA 1 T20/GS 25DT (SUTURE) ×8 IMPLANT
SUT PDS AB 1 TP1 96 (SUTURE) ×8 IMPLANT
SUT SILK 2 0 (SUTURE) ×2
SUT SILK 2 0 SH CR/8 (SUTURE) ×8 IMPLANT
SUT SILK 2 0 TIES 10X30 (SUTURE) ×4 IMPLANT
SUT SILK 2-0 18XBRD TIE 12 (SUTURE) ×2 IMPLANT
SUT SILK 3 0 (SUTURE) ×2
SUT SILK 3 0 SH CR/8 (SUTURE) ×4 IMPLANT
SUT SILK 3 0 TIES 10X30 (SUTURE) ×4 IMPLANT
SUT SILK 3-0 18XBRD TIE 12 (SUTURE) ×2 IMPLANT
SUT VIC AB 3-0 SH 8-18 (SUTURE) ×4 IMPLANT
TAPE CLOTH SURG 4X10 WHT LF (GAUZE/BANDAGES/DRESSINGS) ×4 IMPLANT
TOWEL OR 17X26 10 PK STRL BLUE (TOWEL DISPOSABLE) ×4 IMPLANT
TRAY FOLEY CATH 16FRSI W/METER (SET/KITS/TRAYS/PACK) ×4 IMPLANT
TUBE CONNECTING 12'X1/4 (SUCTIONS)
TUBE CONNECTING 12X1/4 (SUCTIONS) IMPLANT
YANKAUER SUCT BULB TIP NO VENT (SUCTIONS) ×4 IMPLANT

## 2013-03-23 NOTE — Anesthesia Preprocedure Evaluation (Addendum)
Anesthesia Evaluation  Patient identified by MRN, date of birth, ID band Patient awake    Reviewed: Allergy & Precautions, H&P , NPO status , Patient's Chart, lab work & pertinent test results, reviewed documented beta blocker date and time   Airway Mallampati: II TM Distance: >3 FB     Dental  (+) Partial Lower and Partial Upper   Pulmonary Current Smoker,  breath sounds clear to auscultation        Cardiovascular hypertension, Rhythm:Regular Rate:Normal     Neuro/Psych    GI/Hepatic   Endo/Other    Renal/GU      Musculoskeletal   Abdominal   Peds  Hematology   Anesthesia Other Findings   Reproductive/Obstetrics                          Anesthesia Physical Anesthesia Plan  ASA: III and emergent  Anesthesia Plan: General   Post-op Pain Management:    Induction: Intravenous, Rapid sequence and Cricoid pressure planned  Airway Management Planned: Oral ETT  Additional Equipment:   Intra-op Plan:   Post-operative Plan: Possible Post-op intubation/ventilation  Informed Consent: I have reviewed the patients History and Physical, chart, labs and discussed the procedure including the risks, benefits and alternatives for the proposed anesthesia with the patient or authorized representative who has indicated his/her understanding and acceptance.   Dental advisory given  Plan Discussed with: CRNA, Anesthesiologist and Surgeon  Anesthesia Plan Comments: (SBO with hehydration  Plan GA with RSI)       Anesthesia Quick Evaluation

## 2013-03-23 NOTE — Transfer of Care (Signed)
Immediate Anesthesia Transfer of Care Note  Patient: Kristi Alexander  Procedure(s) Performed: Procedure(s): EXPLORATORY LAPAROTOMY (N/A) SMALL BOWEL RESECTION (N/A)  Patient Location: SICU  Anesthesia Type:General  Level of Consciousness: sedated and unresponsive  Airway & Oxygen Therapy: Patient remains intubated per anesthesia plan and Patient placed on Ventilator (see vital sign flow sheet for setting)  Post-op Assessment: Post -op Vital signs reviewed and stable  Post vital signs: Reviewed and stable  Complications: No apparent anesthesia complications

## 2013-03-23 NOTE — Progress Notes (Signed)
Daughter, [name] called by Francesca Jewett, RN and updated on plan to take pt to surgery this am for exploratory lap.

## 2013-03-23 NOTE — Preoperative (Signed)
Beta Blockers   Reason not to administer Beta Blockers:received beta blocker this morning

## 2013-03-23 NOTE — Progress Notes (Signed)
UR Completed.  Kristi Alexander [phone] 03/23/2013

## 2013-03-23 NOTE — Anesthesia Postprocedure Evaluation (Signed)
  Anesthesia Post-op Note  Patient: Kristi Alexander  Procedure(s) Performed: Procedure(s): EXPLORATORY LAPAROTOMY (N/A) SMALL BOWEL RESECTION (N/A)  Patient Location: SICU  Anesthesia Type:General  Level of Consciousness: sedated and Patient remains intubated per anesthesia plan  Airway and Oxygen Therapy: Patient connected to T-piece oxygen, Patient remains intubated per anesthesia plan and Patient placed on Ventilator (see vital sign flow sheet for setting)  Post-op Pain: none  Post-op Assessment: Post-op Vital signs reviewed, Patient's Cardiovascular Status Stable, Respiratory Function Stable, Patent Airway, No signs of Nausea or vomiting and Pain level not controlled  Post-op Vital Signs: e  Complications: No apparent anesthesia complications

## 2013-03-23 NOTE — Consult Note (Signed)
Name: Kristi Alexander MRN: [id number] DOB: Jun 26, 1925    ADMISSION DATE:  03/22/2013 CONSULTATION DATE:  1/28  REFERRING MD :  Hulen Skains PRIMARY SERVICE: [facility]  CHIEF COMPLAINT:  Vent management   BRIEF PATIENT DESCRIPTION: 80 yo female with hx HTN admitted 1/27 with SBO.  Initially managed conservatively but cont to have worsening abd pain and increased WBC and was taken to OR 1/28 for exp lap and bowel resection.  Remained on vent post op and PCCM consulted.   SIGNIFICANT EVENTS / STUDIES:  1/27 admitted with SBO, conservative management  1/28 necrotic bowel suspected, to OR, 3-4 feet frankly necrotic bowel removed (no perforation)   LINES / TUBES: ETT 1/28>>> L brachial aline 1/28>>>  CULTURES: Urine 1/28>>>  ANTIBIOTICS: Flagyl 1/28>>>   HISTORY OF PRESENT ILLNESS:   80 yo female with hx HTN admitted 1/27 with SBO.  Initially managed conservatively but cont to have worsening abd pain and increased WBC and was taken to OR 1/28 for exp lap and bowel resection.  Remained on vent post op and PCCM consulted.   PAST MEDICAL HISTORY :  Past Medical History  Diagnosis Date  . Restless leg syndrome   . Hypertension   . Dyslipidemia   . History of appendectomy   . H/O: hysterectomy   . Cyst of breast, right, benign solitary   . Inguinal hernia   . History of lumbosacral spine surgery    Past Surgical History  Procedure Laterality Date  . Appendectomy    . Abdominal hysterectomy     Prior to Admission medications   Medication Sig Start Date End Date Taking? Authorizing Provider  aspirin 81 MG tablet Take 81 mg by mouth daily.   Yes Historical Provider, MD  atorvastatin (LIPITOR) 20 MG tablet Take 20 mg by mouth daily. 01/04/13  Yes Historical Provider, MD  ferrous sulfate 325 (65 FE) MG tablet Take 325 mg by mouth daily with breakfast.   Yes Historical Provider, MD  hydrochlorothiazide (HYDRODIURIL) 25 MG tablet Take 25 mg by mouth daily.   Yes Historical Provider, MD    metoprolol succinate (TOPROL-XL) 100 MG 24 hr tablet Take 100 mg by mouth daily. Take with or immediately following a meal.   Yes Historical Provider, MD  pramipexole (MIRAPEX) 0.5 MG tablet Take 1 tablet (0.5 mg total) by mouth daily after supper. 01/19/13  Yes Dennie Bible, NP  verapamil (CALAN SR) 240 MG CR tablet Take 240 mg by mouth 2 (two) times daily.   Yes Historical Provider, MD   Allergies  Allergen Reactions  . Penicillins Swelling    FAMILY HISTORY:  Family History  Problem Relation Age of Onset  . Heart attack Father   . Lung cancer Brother   . Healthy Sister    SOCIAL HISTORY:  reports that she has been smoking Cigarettes.  She has been smoking about 0.00 packs per day. She has never used smokeless tobacco. She reports that she drinks alcohol. She reports that she does not use illicit drugs.  REVIEW OF SYSTEMS:  Difficult, pt drowsy post op.  C/o abd pain.     VITAL SIGNS: Temp:  [97.7 F (36.5 C)-98 F (36.7 C)] 98 F (36.7 C) (01/28 0711) Pulse Rate:  [89-108] 90 (01/28 0712) Resp:  [18-38] 38 (01/28 0712) BP: (102-225)/(61-114) 102/61 mmHg (01/28 0711) SpO2:  [93 %-100 %] 93 % (01/28 0712) FiO2 (%):  [100 %] 100 % (01/28 1100) Weight:  [141 lb 1.5 oz (64 kg)] 141  lb 1.5 oz (64 kg) (01/28 0712) HEMODYNAMICS:   VENTILATOR SETTINGS: Vent Mode:  [-] PRVC FiO2 (%):  [100 %] 100 % Set Rate:  [12 bmp] 12 bmp Vt Set:  [400 mL] 400 mL PEEP:  [5 cmH20] 5 cmH20 Plateau Pressure:  [15 cmH20] 15 cmH20 INTAKE / OUTPUT: Intake/Output     01/27 0701 - 01/28 0700 01/28 0701 - 01/29 0700   I.V. (mL/kg) 1200 1818.3 (28.4)   IV Piggyback  500   Total Intake(mL/kg) 1200 2318.3 (36.2)   Urine (mL/kg/hr) 250 35 (0.1)   Emesis/NG output 100 50 (0.1)   Other  250 (0.7)   Total Output 350 335   Net +850 +1983.3        Urine Occurrence 1 x      PHYSICAL EXAMINATION: General:  Thin, elderly female, NAD on vent post op Neuro:  Awake, follows commands, RASS  0 HEENT:  Mm dry, poor dentition, ETT Cardiovascular:  s1s2 rrr Lungs:  resps even non labored on full support  Abdomen:  Distended, tender, dressing c/d  Musculoskeletal:  Warm and dry, no sig edema   LABS:  CBC  Recent Labs Lab 03/22/13 1248 03/23/13 0312  WBC 12.5* 18.1*  HGB 12.4 11.9*  HCT 38.6 36.2  PLT 409* 330   Coag's  Recent Labs Lab 03/22/13 2100  INR 0.93   BMET  Recent Labs Lab 03/22/13 1248 03/23/13 0312  NA 138 139  K 3.8 4.9  CL 92* 102  CO2 23 17*  BUN 15 26*  CREATININE 0.93 1.89*  GLUCOSE 231* 164*   Electrolytes  Recent Labs Lab 03/22/13 1248 03/23/13 0312  CALCIUM 9.8 8.7   Sepsis Markers  Recent Labs Lab 03/22/13 1406 03/23/13 0312  LATICACIDVEN 7.10* 5.5*   ABG  Recent Labs Lab 03/23/13 1222  PHART 7.355  PCO2ART 38.7  PO2ART 256.0*   Liver Enzymes  Recent Labs Lab 03/22/13 1248  AST 20  ALT 11  ALKPHOS 98  BILITOT 0.6  ALBUMIN 3.3*   Cardiac Enzymes No results found for this basename: TROPONINI, PROBNP,  in the last 168 hours Glucose  Recent Labs Lab 03/22/13 2338 03/23/13 0341  GLUCAP 208* 172*    Imaging Ct Abdomen Pelvis W Contrast  03/22/2013   CLINICAL DATA:  Diffuse abdominal pain, nausea and vomiting  EXAM: CT ABDOMEN AND PELVIS WITH CONTRAST  TECHNIQUE: Multidetector CT imaging of the abdomen and pelvis was performed using the standard protocol following bolus administration of intravenous contrast.  CONTRAST:  13mL OMNIPAQUE IOHEXOL 300 MG/ML  SOLN  COMPARISON:  Lumbar spine MRI 10/19/2009  FINDINGS: Lower Chest: Mild subpleural reticulation noted in the lung bases. Additionally, there is some mild dependent atelectasis. No definite pulmonary nodule or focal consolidation. Visualized cardiac structures within normal limits for size. Mildly patulous distal esophagus containing ingested oral contrast material.  Abdomen: Patulous and mildly distended stomach. No focal mass lesion or wall  thickening. Unremarkable duodenum, spleen and right adrenal gland. 1.7 x 2.0 cm enhancing nodule arising from the lateral limb of the left adrenal gland. This is nonspecific by CT appearance. Diffuse low attenuation of the hepatic parenchyma suggesting fatty infiltration. 7 mm well circumscribed hypo attenuating lesion in the inferior aspect of hepatic segment 5 is too small for accurate characterization but statistically highly likely a benign cyst. No additional discrete lesions identified. Gallbladder is unremarkable. No intra or extrahepatic biliary ductal dilatation. Colonic interposition noted incidentally anterior to the colon. Additionally, there is a small amount  of low-attenuation perihepatic ascites. Small ascites is also noted in the left subdiaphragmatic station adjacent to the greater curvature of the stomach.  No hydronephrosis or nephrolithiasis. No enhancing renal mass. Several small left renal cortical defects suggest the sequelae of prior scarring.  Colonic diverticular disease without CT evidence of active inflammation. The colon is largely decompressed. Several loops of abnormal dilated fluid-filled an edematous small bowel noted in the mid abdomen. There is associated edema and fluid within the mesentery and between the bowel loops. There are 2 focal transition points within the center of the collection of abnormal bowel loops best demonstrated on the coronal (series 4) images. Transition points are noted on image 27 and 35 of series 4. Findings are most consistent with a closed loop obstruction. There is persistent enhancement of the bowel mucosa.  Pelvis: Moderate volume of free fluid in the anatomic pelvis. Surgical changes of prior hysterectomy.  Bones/Soft Tissues: No acute fracture or aggressive appearing lytic or blastic osseous lesion. Extensive multilevel degenerative disc disease and lower lumbar facet arthropathy.  Vascular: Extensive atherosclerotic vascular disease. No aneurysmal  dilatation. Probable right renal and right external iliac stenoses.  IMPRESSION: 1. CT findings are consistent with high-grade closed loop small bowel obstruction. Two adjacent transition points noted the mid abdomen. Differential considerations include volvulus and internal hernia. Associated significant focal mesenteric edema, interloop fluid and moderate free peritoneal fluid. The affected small bowel mucosa does continue to enhance. No free air, pneumatosis or portal venous gas. Recommend surgical consultation. 2. Colonic diverticular disease without CT evidence of active inflammation. 3. Nonspecific 2 cm enhancing left adrenal nodule. Statistically, this is likely a benign adenoma, however it is indeterminate by imaging and could represent a metastatic lesion if the patient has a known primary malignancy. If clinically warranted, adrenal protocol CT or MRI could further evaluate. 4. Hepatic steatosis. 5. Extensive multilevel degenerative disc disease and lower lumbar facet arthropathy. 6. Extensive atherosclerotic vascular disease with probable right renal and right external iliac stenoses. 7. Mild subpleural reticulation in the visualized lower lungs suggests early pulmonary fibrosis. Critical Value/emergent results were called by telephone at the time of interpretation on 03/22/2013 at 3:33 PM to Dr. Carmin Muskrat , who verbally acknowledged these results.   Electronically Signed   By: Jacqulynn Cadet M.D.   On: 03/22/2013 15:34     ASSESSMENT / PLAN:  PULMONARY Post op Vent dependent  P:   SBT in am -- would hold on extubation this soon post op given extensive dead bowel and potential for clinical worsening CXR now and in am  F/u ABG   CARDIOVASCULAR Hx HTN - currently borderline hypotension  SIRS - high risk for shock in setting significant dead bowel  P:  D/c metoprolol  Gentle volume  Check pct, lactate   RENAL Acute renal insufficiency  P:   Gentle volume  Trend lactate  F/u  chem   GASTROINTESTINAL SBO -- s/p ecp lap 1/28 with 4 ft necrotic bowel  P:   Per surgery  PPI  HEMATOLOGIC No active issue  P:  F/u cbc   INFECTIOUS Necrotic bowel - without perforation  P:   Empiric abx - cipro/ flagyl for now   ENDOCRINE No active issue    P:   Trend glucose on bmet   NEUROLOGIC Post op pain  P:   PRN fentanyl   WHITEHEART,KATHRYN, NP 03/23/2013  12:39 PM Pager: [phone] or [phone]  Patient is weaning relatively ok but is likely to  get sicker before well.  Based on that and freshly opened abdomen, decision was made to leave patient sedated and intubated overnight with propofol and if improved in AM would consider extubation.  I have personally obtained a history, examined the patient, evaluated laboratory and imaging results, formulated the assessment and plan and placed orders.  CRITICAL CARE: The patient is critically ill with multiple organ systems failure and requires high complexity decision making for assessment and support, frequent evaluation and titration of therapies, application of advanced monitoring technologies and extensive interpretation of multiple databases. Critical Care Time devoted to patient care services described in this note is 35 minutes.   *Care during the described time interval was provided by me and/or other providers on the critical care team. I have reviewed this patient's available data, including medical history, events of note, physical examination and test results as part of my evaluation.  Rush Farmer, M.D. [facility] Pulmonary/Critical Care Medicine. Pager: [phone]. After hours pager: [phone].

## 2013-03-23 NOTE — Op Note (Addendum)
OPERATIVE REPORT  DATE OF OPERATION:  03/23/2013  PATIENT:  Kristi Alexander  80 y.o. female  PRE-OPERATIVE DIAGNOSIS:  Strangulating bowel obstruction  POST-OPERATIVE DIAGNOSIS:  Strangulating bowel obstruction  PROCEDURE:  Procedure(s): EXPLORATORY LAPAROTOMY SMALL BOWEL RESECTION  SURGEON:  Surgeon(s): Gwenyth Ober, MD  ASSISTANT: Creig Hines, PA-C  ANESTHESIA:   general  EBL: <50 ml  BLOOD ADMINISTERED: none  DRAINS: Nasogastric Tube and Urinary Catheter (Foley)   SPECIMEN:  Source of Specimen:  Infarcted small bowel  COUNTS CORRECT:  YES  PROCEDURE DETAILS: The patient was taken to the operating room and placed on the table in the supine position. After an adequate general endotracheal anesthetic was administered she was prepped and draped in the usual sterile manner exposing the entire abdomen.  A proper time out was performed identifying the patient and the procedure to be performed. A midline incision was made using a #10 blade and taken down above the umbilicus to the left of the umbilicus down through the old previous scar in the lower abdomen. The initial incision was approximately 8-10 cm long this was subsequently extended to approximately 20 cm long after we found the infarcted small bowel in the abdomen.  The way down to and through the midline fascia using electrocautery. Immediately upon entering the peritoneal cavity that was bloody ascites. Approximately 500 cc of the bloody ascites was aspirated. We opened the incision completely and we were able to see approximately 3-4 feet of frankly necrotic small bowel which had not perforated.  The extent of the small bowel when proximally up to the mid to distal jejunum down to the proximal ileum. The terminal ileum and the ligament of Treitz were clearly not involved. The total length of the infarcted area was approximately 4 feet.  We came across the viable proximal small bowel using a GIA-75 stapler. We did the same  distally using a GIA 75 stapler. The mesentery was taken using a LigaSure device. We subsequently performed a side-to-side, functional end-to-end, anastomosis using a GIA-75 stapler. The resulting enterotomy was closed using a TA-60 stapler. The mesenteric defect was closed using interrupted 2-0 silk sutures. A corner stitch at the anastomosis was also placed using a 2-0 silk pop-off suture. We subsequently irrigated the peritoneal cavity with saline. Seprafilm was applied to the peritoneal cavity. We then closed the abdomen using running looped #1 PDS suture with internal #1 Novafil retention sutures.  All needle counts, sponge counts, and instrument counts were correct.  PATIENT DISPOSITION:  ICU - intubated and hemodynamically stable.   Idara Woodside O 1/28/201510:32 AM

## 2013-03-23 NOTE — Progress Notes (Signed)
[facility] Progress Note    Subjective: Patient much more tender this AM.  Still has a very significant lactic acidosis, and her WBC is increased.  Objective: Vital signs in last 24 hours: Temp:  [97.7 F (36.5 C)-98 F (36.7 C)] 98 F (36.7 C) (01/28 0711) Pulse Rate:  [89-108] 90 (01/28 0712) Resp:  [18-38] 38 (01/28 0712) BP: (102-225)/(61-114) 102/61 mmHg (01/28 0711) SpO2:  [93 %-100 %] 93 % (01/28 0712) Weight:  [64 kg (141 lb 1.5 oz)] 64 kg (141 lb 1.5 oz) (01/28 0712) Last BM Date: 03/21/13  Intake/Output from previous day: 01/27 0701 - 01/28 0700 In: 1200 [I.V.:1200] Out: 350 [Urine:250; Emesis/NG output:100] Intake/Output this shift: Total I/O In: 118.3 [I.V.:118.3] Out: 50 [Emesis/NG output:50]  General: Moderate acute distress.  Very sore to move   Lungs: Clear, but short of breath,   Abd: Tender, distended, no bowel sounds.  Extremities: No changes  Neuro: Intact  Lab Results:  @LABLAST2 (wbc:2,hgb:2,hct:2,plt:2) BMET  Recent Labs  03/22/13 1248 03/23/13 0312  NA 138 139  K 3.8 4.9  CL 92* 102  CO2 23 17*  GLUCOSE 231* 164*  BUN 15 26*  CREATININE 0.93 1.89*  CALCIUM 9.8 8.7   PT/INR  Recent Labs  03/22/13 2100  LABPROT 12.3  INR 0.93   ABG No results found for this basename: PHART, PCO2, PO2, HCO3,  in the last 72 hours  Studies/Results: Ct Abdomen Pelvis W Contrast  03/22/2013   CLINICAL DATA:  Diffuse abdominal pain, nausea and vomiting  EXAM: CT ABDOMEN AND PELVIS WITH CONTRAST  TECHNIQUE: Multidetector CT imaging of the abdomen and pelvis was performed using the standard protocol following bolus administration of intravenous contrast.  CONTRAST:  113mL OMNIPAQUE IOHEXOL 300 MG/ML  SOLN  COMPARISON:  Lumbar spine MRI 10/19/2009  FINDINGS: Lower Chest: Mild subpleural reticulation noted in the lung bases. Additionally, there is some mild dependent atelectasis. No definite pulmonary nodule or focal consolidation. Visualized cardiac  structures within normal limits for size. Mildly patulous distal esophagus containing ingested oral contrast material.  Abdomen: Patulous and mildly distended stomach. No focal mass lesion or wall thickening. Unremarkable duodenum, spleen and right adrenal gland. 1.7 x 2.0 cm enhancing nodule arising from the lateral limb of the left adrenal gland. This is nonspecific by CT appearance. Diffuse low attenuation of the hepatic parenchyma suggesting fatty infiltration. 7 mm well circumscribed hypo attenuating lesion in the inferior aspect of hepatic segment 5 is too small for accurate characterization but statistically highly likely a benign cyst. No additional discrete lesions identified. Gallbladder is unremarkable. No intra or extrahepatic biliary ductal dilatation. Colonic interposition noted incidentally anterior to the colon. Additionally, there is a small amount of low-attenuation perihepatic ascites. Small ascites is also noted in the left subdiaphragmatic station adjacent to the greater curvature of the stomach.  No hydronephrosis or nephrolithiasis. No enhancing renal mass. Several small left renal cortical defects suggest the sequelae of prior scarring.  Colonic diverticular disease without CT evidence of active inflammation. The colon is largely decompressed. Several loops of abnormal dilated fluid-filled an edematous small bowel noted in the mid abdomen. There is associated edema and fluid within the mesentery and between the bowel loops. There are 2 focal transition points within the center of the collection of abnormal bowel loops best demonstrated on the coronal (series 4) images. Transition points are noted on image 27 and 35 of series 4. Findings are most consistent with a closed loop obstruction. There is persistent enhancement of the bowel  mucosa.  Pelvis: Moderate volume of free fluid in the anatomic pelvis. Surgical changes of prior hysterectomy.  Bones/Soft Tissues: No acute fracture or aggressive  appearing lytic or blastic osseous lesion. Extensive multilevel degenerative disc disease and lower lumbar facet arthropathy.  Vascular: Extensive atherosclerotic vascular disease. No aneurysmal dilatation. Probable right renal and right external iliac stenoses.  IMPRESSION: 1. CT findings are consistent with high-grade closed loop small bowel obstruction. Two adjacent transition points noted the mid abdomen. Differential considerations include volvulus and internal hernia. Associated significant focal mesenteric edema, interloop fluid and moderate free peritoneal fluid. The affected small bowel mucosa does continue to enhance. No free air, pneumatosis or portal venous gas. Recommend surgical consultation. 2. Colonic diverticular disease without CT evidence of active inflammation. 3. Nonspecific 2 cm enhancing left adrenal nodule. Statistically, this is likely a benign adenoma, however it is indeterminate by imaging and could represent a metastatic lesion if the patient has a known primary malignancy. If clinically warranted, adrenal protocol CT or MRI could further evaluate. 4. Hepatic steatosis. 5. Extensive multilevel degenerative disc disease and lower lumbar facet arthropathy. 6. Extensive atherosclerotic vascular disease with probable right renal and right external iliac stenoses. 7. Mild subpleural reticulation in the visualized lower lungs suggests early pulmonary fibrosis. Critical Value/emergent results were called by telephone at the time of interpretation on 03/22/2013 at 3:33 PM to Dr. Carmin Muskrat , who verbally acknowledged these results.   Electronically Signed   By: Jacqulynn Cadet M.D.   On: 03/22/2013 15:34    Anti-infectives: Anti-infectives   Start     Dose/Rate Route Frequency Ordered Stop   03/23/13 0730  ciprofloxacin (CIPRO) IVPB 400 mg     400 mg 200 mL/hr over 60 Minutes Intravenous On call to O.R. 03/23/13 0728 03/24/13 0559   03/23/13 0730  metroNIDAZOLE (FLAGYL) IVPB 500 mg      500 mg 100 mL/hr over 60 Minutes Intravenous On call to O.R. 03/23/13 1224 03/24/13 0559      Assessment/Plan: s/p Procedure(s): EXPLORATORY LAPAROTOMY Needs to go to the OR ASAP. Likely has a strangulating obstruction of the small bowel.  I have explained the risks and benefits of the procedure to the patient, and we will proceed.  Will possibly need bowel resection.  LOS: 1 day   Kathryne Eriksson. Dahlia Bailiff, MD, FACS [phone] [facility] Surgery 03/23/2013

## 2013-03-23 NOTE — Anesthesia Postprocedure Evaluation (Signed)
  Anesthesia Post-op Note  Patient: Kristi Alexander  Procedure(s) Performed: Procedure(s): EXPLORATORY LAPAROTOMY (N/A) SMALL BOWEL RESECTION (N/A)  Patient Location: SICU  Anesthesia Type:General  Level of Consciousness: sedated and unresponsive  Airway and Oxygen Therapy: Patient remains intubated per anesthesia plan and Patient placed on Ventilator (see vital sign flow sheet for setting)  Post-op Pain: none  Post-op Assessment: Post-op Vital signs reviewed, Patient's Cardiovascular Status Stable, Respiratory Function Stable, Patent Airway and No signs of Nausea or vomiting  Post-op Vital Signs: Reviewed and stable  Complications: No apparent anesthesia complications

## 2013-03-24 ENCOUNTER — Encounter (HOSPITAL_COMMUNITY): Payer: Self-pay | Admitting: General Surgery

## 2013-03-24 LAB — POCT I-STAT 3, ART BLOOD GAS (G3+)
Acid-base deficit: 6 mmol/L — ABNORMAL HIGH (ref 0.0–2.0)
Bicarbonate: 18.3 mEq/L — ABNORMAL LOW (ref 20.0–24.0)
O2 Saturation: 97 %
PCO2 ART: 32.1 mmHg — AB (ref 35.0–45.0)
PH ART: 7.369 (ref 7.350–7.450)
TCO2: 19 mmol/L (ref 0–100)
pO2, Arterial: 94 mmHg (ref 80.0–100.0)

## 2013-03-24 LAB — CBC
HCT: 23.8 % — ABNORMAL LOW (ref 36.0–46.0)
Hemoglobin: 7.9 g/dL — ABNORMAL LOW (ref 12.0–15.0)
MCH: 24.7 pg — AB (ref 26.0–34.0)
MCHC: 33.2 g/dL (ref 30.0–36.0)
MCV: 74.4 fL — ABNORMAL LOW (ref 78.0–100.0)
PLATELETS: 216 10*3/uL (ref 150–400)
RBC: 3.2 MIL/uL — ABNORMAL LOW (ref 3.87–5.11)
RDW: 20.2 % — AB (ref 11.5–15.5)
WBC: 13.6 10*3/uL — ABNORMAL HIGH (ref 4.0–10.5)

## 2013-03-24 LAB — BASIC METABOLIC PANEL
BUN: 41 mg/dL — ABNORMAL HIGH (ref 6–23)
CALCIUM: 7.1 mg/dL — AB (ref 8.4–10.5)
CO2: 17 mEq/L — ABNORMAL LOW (ref 19–32)
CREATININE: 3.74 mg/dL — AB (ref 0.50–1.10)
Chloride: 108 mEq/L (ref 96–112)
GFR, EST AFRICAN AMERICAN: 12 mL/min — AB (ref >90)
GFR, EST NON AFRICAN AMERICAN: 10 mL/min — AB (ref >90)
Glucose, Bld: 99 mg/dL (ref 70–99)
Potassium: 4.6 mEq/L (ref 3.7–5.3)
Sodium: 141 mEq/L (ref 137–147)

## 2013-03-24 LAB — PREPARE RBC (CROSSMATCH)

## 2013-03-24 MED ORDER — CHLORHEXIDINE GLUCONATE 0.12 % MT SOLN
15.0000 mL | Freq: Two times a day (BID) | OROMUCOSAL | Status: DC
  Administered 2013-03-24 – 2013-03-25 (×3): 15 mL via OROMUCOSAL
  Filled 2013-03-24 (×3): qty 15

## 2013-03-24 MED ORDER — BIOTENE DRY MOUTH MT LIQD
15.0000 mL | Freq: Two times a day (BID) | OROMUCOSAL | Status: DC
  Administered 2013-03-25 (×2): 15 mL via OROMUCOSAL

## 2013-03-24 NOTE — Progress Notes (Signed)
Name: Kristi Alexander MRN: [id number] DOB: 13-Oct-1925    ADMISSION DATE:  03/22/2013 CONSULTATION DATE:  1/28  REFERRING MD :  Dr Hulen Skains PRIMARY SERVICE: [facility]  CHIEF COMPLAINT:  Vent management   BRIEF PATIENT DESCRIPTION: 80 yo female with hx HTN admitted 1/27 with SBO.  Initially managed conservatively but cont to have worsening abd pain and increased WBC and was taken to OR 1/28 for exp lap and bowel resection.  Remained on vent post op and PCCM consulted.   SIGNIFICANT EVENTS / STUDIES:  1/27 admitted with SBO, conservative management  1/28 necrotic bowel suspected, to OR, 3-4 feet frankly necrotic bowel removed (no perforation)   LINES / TUBES: ETT 1/28>>> L brachial aline 1/28>>>  CULTURES: Urine 1/28>>>  ANTIBIOTICS: Flagyl 1/28>>> Cipro 1/29 >>   Subjective/Interval events:   VITAL SIGNS: Temp:  [98 F (36.7 C)-101.8 F (38.8 C)] 101.8 F (38.8 C) (01/29 0722) Pulse Rate:  [94-114] 112 (01/29 0900) Resp:  [16-30] 30 (01/29 0900) BP: (76-152)/(44-67) 123/53 mmHg (01/29 0900) SpO2:  [93 %-100 %] 100 % (01/29 0900) Arterial Line BP: (64-206)/(43-202) 148/49 mmHg (01/29 0900) FiO2 (%):  [40 %-100 %] 40 % (01/29 0900) HEMODYNAMICS:   VENTILATOR SETTINGS: Vent Mode:  [-] PSV;CPAP FiO2 (%):  [40 %-100 %] 40 % Set Rate:  [12 bmp] 12 bmp Vt Set:  [400 mL] 400 mL PEEP:  [5 cmH20] 5 cmH20 Pressure Support:  [10 cmH20] 10 cmH20 Plateau Pressure:  [9 cmH20-19 cmH20] 15 cmH20 INTAKE / OUTPUT: Intake/Output     01/28 0701 - 01/29 0700 01/29 0701 - 01/30 0700   I.V. (mL/kg) 4191.3 (65.5)    NG/GT 30 30   IV Piggyback 2700    Total Intake(mL/kg) 6921.3 (108.1) 30 (0.5)   Urine (mL/kg/hr) 189 (0.1) 33 (0.2)   Emesis/NG output 350 (0.2)    Other 250 (0.2)    Total Output 789 33   Net +6132.3 -3          PHYSICAL EXAMINATION: General:  Thin, elderly female, NAD on vent post op Neuro:  Awake, follows commands, RASS 0 HEENT:  Mm dry, poor dentition,  ETT Cardiovascular:  s1s2 rrr Lungs:  resps even non labored on full support  Abdomen:  Distended, tender, dressing c/d  Musculoskeletal:  Warm and dry, no sig edema   LABS:  CBC  Recent Labs Lab 03/23/13 0312 03/23/13 1213 03/24/13 0422  WBC 18.1* 15.0* 13.6*  HGB 11.9* 8.8* 7.9*  HCT 36.2 26.6* 23.8*  PLT 330 235 216   Coag's  Recent Labs Lab 03/22/13 2100  INR 0.93   BMET  Recent Labs Lab 03/23/13 0312 03/23/13 1213 03/24/13 0422  NA 139 139 141  K 4.9 4.2 4.6  CL 102 105 108  CO2 17* 20 17*  BUN 26* 31* 41*  CREATININE 1.89* 2.56* 3.74*  GLUCOSE 164* 100* 99   Electrolytes  Recent Labs Lab 03/23/13 0312 03/23/13 1213 03/24/13 0422  CALCIUM 8.7 7.3* 7.1*   Sepsis Markers  Recent Labs Lab 03/22/13 1406 03/23/13 0312 03/23/13 1430  LATICACIDVEN 7.10* 5.5* 2.1   ABG  Recent Labs Lab 03/23/13 1222 03/24/13 0406  PHART 7.355 7.369  PCO2ART 38.7 32.1*  PO2ART 256.0* 94.0   Liver Enzymes  Recent Labs Lab 03/22/13 1248  AST 20  ALT 11  ALKPHOS 98  BILITOT 0.6  ALBUMIN 3.3*   Cardiac Enzymes No results found for this basename: TROPONINI, PROBNP,  in the last 168 hours Glucose  Recent  Labs Lab 03/22/13 2338 03/23/13 0341  GLUCAP 208* 172*    Imaging Ct Abdomen Pelvis W Contrast  03/22/2013   CLINICAL DATA:  Diffuse abdominal pain, nausea and vomiting  EXAM: CT ABDOMEN AND PELVIS WITH CONTRAST  TECHNIQUE: Multidetector CT imaging of the abdomen and pelvis was performed using the standard protocol following bolus administration of intravenous contrast.  CONTRAST:  129mL OMNIPAQUE IOHEXOL 300 MG/ML  SOLN  COMPARISON:  Lumbar spine MRI 10/19/2009  FINDINGS: Lower Chest: Mild subpleural reticulation noted in the lung bases. Additionally, there is some mild dependent atelectasis. No definite pulmonary nodule or focal consolidation. Visualized cardiac structures within normal limits for size. Mildly patulous distal esophagus containing  ingested oral contrast material.  Abdomen: Patulous and mildly distended stomach. No focal mass lesion or wall thickening. Unremarkable duodenum, spleen and right adrenal gland. 1.7 x 2.0 cm enhancing nodule arising from the lateral limb of the left adrenal gland. This is nonspecific by CT appearance. Diffuse low attenuation of the hepatic parenchyma suggesting fatty infiltration. 7 mm well circumscribed hypo attenuating lesion in the inferior aspect of hepatic segment 5 is too small for accurate characterization but statistically highly likely a benign cyst. No additional discrete lesions identified. Gallbladder is unremarkable. No intra or extrahepatic biliary ductal dilatation. Colonic interposition noted incidentally anterior to the colon. Additionally, there is a small amount of low-attenuation perihepatic ascites. Small ascites is also noted in the left subdiaphragmatic station adjacent to the greater curvature of the stomach.  No hydronephrosis or nephrolithiasis. No enhancing renal mass. Several small left renal cortical defects suggest the sequelae of prior scarring.  Colonic diverticular disease without CT evidence of active inflammation. The colon is largely decompressed. Several loops of abnormal dilated fluid-filled an edematous small bowel noted in the mid abdomen. There is associated edema and fluid within the mesentery and between the bowel loops. There are 2 focal transition points within the center of the collection of abnormal bowel loops best demonstrated on the coronal (series 4) images. Transition points are noted on image 27 and 35 of series 4. Findings are most consistent with a closed loop obstruction. There is persistent enhancement of the bowel mucosa.  Pelvis: Moderate volume of free fluid in the anatomic pelvis. Surgical changes of prior hysterectomy.  Bones/Soft Tissues: No acute fracture or aggressive appearing lytic or blastic osseous lesion. Extensive multilevel degenerative disc  disease and lower lumbar facet arthropathy.  Vascular: Extensive atherosclerotic vascular disease. No aneurysmal dilatation. Probable right renal and right external iliac stenoses.  IMPRESSION: 1. CT findings are consistent with high-grade closed loop small bowel obstruction. Two adjacent transition points noted the mid abdomen. Differential considerations include volvulus and internal hernia. Associated significant focal mesenteric edema, interloop fluid and moderate free peritoneal fluid. The affected small bowel mucosa does continue to enhance. No free air, pneumatosis or portal venous gas. Recommend surgical consultation. 2. Colonic diverticular disease without CT evidence of active inflammation. 3. Nonspecific 2 cm enhancing left adrenal nodule. Statistically, this is likely a benign adenoma, however it is indeterminate by imaging and could represent a metastatic lesion if the patient has a known primary malignancy. If clinically warranted, adrenal protocol CT or MRI could further evaluate. 4. Hepatic steatosis. 5. Extensive multilevel degenerative disc disease and lower lumbar facet arthropathy. 6. Extensive atherosclerotic vascular disease with probable right renal and right external iliac stenoses. 7. Mild subpleural reticulation in the visualized lower lungs suggests early pulmonary fibrosis. Critical Value/emergent results were called by telephone at the time of  interpretation on 03/22/2013 at 3:33 PM to Dr. Carmin Muskrat , who verbally acknowledged these results.   Electronically Signed   By: Jacqulynn Cadet M.D.   On: 03/22/2013 15:34   Dg Chest Portable 1 View  03/23/2013   CLINICAL DATA:  ET tube placement  EXAM: PORTABLE CHEST - 1 VIEW  COMPARISON:  DG CHEST 1V PORT dated 11/15/2009  FINDINGS: Low lung volumes. Cardiac silhouette is within the upper limits of normal. Endotracheal tube is appreciated with tip 4.7 mm above the carina projecting in the level of the clavicles. There is increased  density projects in the lung bases. There is blunting of the costophrenic angles. Prominence of interstitial markings appreciated as well as areas of peribronchial cuffing. Atherosclerotic calcification identified within the aorta. The osseous structures unremarkable. An NG tube is appreciated tip not on the view of this study.  IMPRESSION: 1. Endotracheal tube which appears to be adequately positioned. 2. Interstitial infiltrate differential considerations are pulmonary edema versus an infectious or inflammatory infiltrate. 3. Small bilateral effusions left greater than right 4. Areas of asymmetric edema versus atelectasis versus infiltrate within the lung bases.   Electronically Signed   By: Margaree Mackintosh M.D.   On: 03/23/2013 13:14     ASSESSMENT / PLAN:  PULMONARY Acute Respiratory Failure due to bowel ischemia and severe sepsis P:   SBT's 1/29 as tolerated, goal extubate  CARDIOVASCULAR Hx HTN - currently borderline hypotension  Severe Sepsis without shock, lactate cleared over 24 hours P:  Holding metoprolol  Gentle volume, 8L positive since surgery  RENAL Acute renal failure, oliguric, ATN  Lactic acidosis, resolved P:   Follow UOP and S Cr, hopefully will plateau and then improve without intervention Could consider a lasix challenge, will wait and follow on 1/30 then decide  GASTROINTESTINAL SBO -- s/p exp lap 1/28 with 4 ft necrotic bowel  P:   PPI ordered NPO until OK with CCS Will leave NGT in place if we extubate  HEMATOLOGIC No active issue  P:  F/u cbc   INFECTIOUS Necrotic bowel - without perforation  P:   Empiric abx - cipro/ flagyl   ENDOCRINE No active issue    P:   Trend glucose on bmet   NEUROLOGIC Post op pain  P:   PRN fentanyl    I have personally obtained a history, examined the patient, evaluated laboratory and imaging results, formulated the assessment and plan and placed orders.  CRITICAL CARE: The patient is critically ill with  multiple organ systems failure and requires high complexity decision making for assessment and support, frequent evaluation and titration of therapies, application of advanced monitoring technologies and extensive interpretation of multiple databases. Critical Care Time devoted to patient care services described in this note is 35 minutes.   *Care during the described time interval was provided by me and/or other providers on the critical care team. I have reviewed this patient's available data, including medical history, events of note, physical examination and test results as part of my evaluation.   Baltazar Apo, MD, PhD 03/24/2013, 10:35 AM [facility] [phone] or if no answer [phone]

## 2013-03-24 NOTE — Progress Notes (Signed)
PT Cancellation Note  Patient Details Name: Kristi Alexander MRN: [id number] DOB: [birth year]   Cancelled Treatment:    Reason Eval/Treat Not Completed: Other (comment) (pt not yet 4 hrs post extubation)   Kristi Alexander 03/24/2013, 1:46 PM Elwyn Reach, [place]

## 2013-03-24 NOTE — Procedures (Signed)
Extubation Procedure Note  Patient Details:   Name: Kristi Alexander DOB: Nov 20, 1925 MRN: [id number]   Pt extubated to [facility] per MD order after successful SBT.  Positive cuff leak noted.  Pt tolerated well. VS stable, pt able to vocalize and has strong productive cough. FVC .900        Evaluation  O2 sats: stable throughout Complications: No apparent complications Patient did tolerate procedure well. Bilateral Breath Sounds: Clear Suctioning: Airway Yes  Jarius Dieudonne Apple 03/24/2013, 11:22 AM

## 2013-03-24 NOTE — Progress Notes (Signed)
Patient ID: Kristi Alexander, female   DOB: 03/28/1925, 80 y.o.   MRN: [id number]  Subjective: Pt awake on vent.  Febrile, white count down.  Worsening renal function  Objective:  Vital signs:  Filed Vitals:   03/24/13 0700 03/24/13 0722 03/24/13 0800 03/24/13 0828  BP: 122/49  121/54 152/52  Pulse: 110  110 114  Temp:  101.8 F (38.8 C)    TempSrc:  Oral    Resp: 28  26 27   Height:      Weight:      SpO2: 100%  100% 100%    Last BM Date: 03/21/13  Intake/Output   Yesterday:  01/28 0701 - 01/29 0700 In: 6921.3 [I.V.:4191.3; NG/GT:30; IV Piggyback:2700] Out: 619 [Urine:189; Emesis/NG output:350] This shift:  Total I/O In: -  Out: 20 [Urine:20]   Physical Exam:  General: Pt awake/alert on vent Chest: CTA bilaterally  CV:  Pulses intact.  Regular rhythm Abdomen: Soft.  Nondistended.  No bowel sounds.  Mildly tender at incisions only.  Dressings is c/d/i.     Problem List:   Active Problems:   SBO (small bowel obstruction)   Acute respiratory failure   Sepsis    Results:   Labs: Results for orders placed during the hospital encounter of 03/22/13 (from the past 48 hour(s))  CBC WITH DIFFERENTIAL     Status: Abnormal   Collection Time    03/22/13 12:48 PM      Result Value Range   WBC 12.5 (*) 4.0 - 10.5 K/uL   RBC 5.08  3.87 - 5.11 MIL/uL   Hemoglobin 12.4  12.0 - 15.0 g/dL   HCT 38.6  36.0 - 46.0 %   MCV 76.0 (*) 78.0 - 100.0 fL   MCH 24.4 (*) 26.0 - 34.0 pg   MCHC 32.1  30.0 - 36.0 g/dL   RDW 20.3 (*) 11.5 - 15.5 %   Platelets 409 (*) 150 - 400 K/uL   Neutrophils Relative % 88 (*) 43 - 77 %   Neutro Abs 11.0 (*) 1.7 - 7.7 K/uL   Lymphocytes Relative 6 (*) 12 - 46 %   Lymphs Abs 0.7  0.7 - 4.0 K/uL   Monocytes Relative 7  3 - 12 %   Monocytes Absolute 0.8  0.1 - 1.0 K/uL   Eosinophils Relative 0  0 - 5 %   Eosinophils Absolute 0.0  0.0 - 0.7 K/uL   Basophils Relative 0  0 - 1 %   Basophils Absolute 0.0  0.0 - 0.1 K/uL  COMPREHENSIVE METABOLIC  PANEL     Status: Abnormal   Collection Time    03/22/13 12:48 PM      Result Value Range   Sodium 138  137 - 147 mEq/L   Potassium 3.8  3.7 - 5.3 mEq/L   Chloride 92 (*) 96 - 112 mEq/L   CO2 23  19 - 32 mEq/L   Glucose, Bld 231 (*) 70 - 99 mg/dL   BUN 15  6 - 23 mg/dL   Creatinine, Ser 0.93  0.50 - 1.10 mg/dL   Calcium 9.8  8.4 - 10.5 mg/dL   Total Protein 7.8  6.0 - 8.3 g/dL   Albumin 3.3 (*) 3.5 - 5.2 g/dL   AST 20  0 - 37 U/L   ALT 11  0 - 35 U/L   Alkaline Phosphatase 98  39 - 117 U/L   Total Bilirubin 0.6  0.3 - 1.2 mg/dL   GFR calc non  Af Amer 54 (*) >90 mL/min   GFR calc Af Amer 63 (*) >90 mL/min   Comment: (NOTE)     The eGFR has been calculated using the CKD EPI equation.     This calculation has not been validated in all clinical situations.     eGFR's persistently <90 mL/min signify possible Chronic Kidney     Disease.  URINALYSIS, ROUTINE W REFLEX MICROSCOPIC     Status: Abnormal   Collection Time    03/22/13  1:56 PM      Result Value Range   Color, Urine AMBER (*) YELLOW   Comment: BIOCHEMICALS MAY BE AFFECTED BY COLOR   APPearance HAZY (*) CLEAR   Specific Gravity, Urine 1.026  1.005 - 1.030   pH 6.5  5.0 - 8.0   Glucose, UA 100 (*) NEGATIVE mg/dL   Hgb urine dipstick NEGATIVE  NEGATIVE   Bilirubin Urine SMALL (*) NEGATIVE   Ketones, ur 15 (*) NEGATIVE mg/dL   Protein, ur 100 (*) NEGATIVE mg/dL   Urobilinogen, UA 1.0  0.0 - 1.0 mg/dL   Nitrite NEGATIVE  NEGATIVE   Leukocytes, UA TRACE (*) NEGATIVE  URINE MICROSCOPIC-ADD ON     Status: Abnormal   Collection Time    03/22/13  1:56 PM      Result Value Range   Squamous Epithelial / LPF FEW (*) RARE   WBC, UA 3-6  <3 WBC/hpf   RBC / HPF 0-2  <3 RBC/hpf   Bacteria, UA FEW (*) RARE   Casts HYALINE CASTS (*) NEGATIVE   Urine-Other MUCOUS PRESENT    URINE CULTURE     Status: None   Collection Time    03/22/13  1:56 PM      Result Value Range   Specimen Description URINE, CLEAN CATCH     Special  Requests NONE     Culture  Setup Time       Value: 03/22/2013 20:58     Performed at [facility]       Value: 50,000 COLONIES/ML     Performed at [facility]   Culture       Value: Multiple bacterial morphotypes present, none predominant. Suggest appropriate recollection if clinically indicated.     Performed at [facility]   Report Status 03/23/2013 FINAL    CG4 I-STAT (LACTIC ACID)     Status: Abnormal   Collection Time    03/22/13  2:06 PM      Result Value Range   Lactic Acid, Venous 7.10 (*) 0.5 - 2.2 mmol/L  PROTIME-INR     Status: None   Collection Time    03/22/13  9:00 PM      Result Value Range   Prothrombin Time 12.3  11.6 - 15.2 seconds   INR 0.93  0.00 - 1.49  GLUCOSE, CAPILLARY     Status: Abnormal   Collection Time    03/22/13 11:38 PM      Result Value Range   Glucose-Capillary 208 (*) 70 - 99 mg/dL   Comment 1 Notify RN     Comment 2 Documented in Chart    LACTIC ACID, PLASMA     Status: Abnormal   Collection Time    03/23/13  3:12 AM      Result Value Range   Lactic Acid, Venous 5.5 (*) 0.5 - 2.2 mmol/L  CBC     Status: Abnormal   Collection Time    03/23/13  3:12 AM      Result Value Range   WBC 18.1 (*) 4.0 - 10.5 K/uL   RBC 4.76  3.87 - 5.11 MIL/uL   Hemoglobin 11.9 (*) 12.0 - 15.0 g/dL   HCT 36.2  36.0 - 46.0 %   MCV 76.1 (*) 78.0 - 100.0 fL   MCH 25.0 (*) 26.0 - 34.0 pg   MCHC 32.9  30.0 - 36.0 g/dL   RDW 20.5 (*) 11.5 - 15.5 %   Platelets 330  150 - 400 K/uL  BASIC METABOLIC PANEL     Status: Abnormal   Collection Time    03/23/13  3:12 AM      Result Value Range   Sodium 139  137 - 147 mEq/L   Potassium 4.9  3.7 - 5.3 mEq/L   Chloride 102  96 - 112 mEq/L   CO2 17 (*) 19 - 32 mEq/L   Glucose, Bld 164 (*) 70 - 99 mg/dL   BUN 26 (*) 6 - 23 mg/dL   Creatinine, Ser 1.89 (*) 0.50 - 1.10 mg/dL   Comment: DELTA CHECK NOTED   Calcium 8.7  8.4 - 10.5 mg/dL   GFR calc non Af Amer 23 (*) >90 mL/min    GFR calc Af Amer 27 (*) >90 mL/min   Comment: (NOTE)     The eGFR has been calculated using the CKD EPI equation.     This calculation has not been validated in all clinical situations.     eGFR's persistently <90 mL/min signify possible Chronic Kidney     Disease.  GLUCOSE, CAPILLARY     Status: Abnormal   Collection Time    03/23/13  3:41 AM      Result Value Range   Glucose-Capillary 172 (*) 70 - 99 mg/dL   Comment 1 Notify RN     Comment 2 Documented in Chart    SURGICAL PCR SCREEN     Status: Abnormal   Collection Time    03/23/13  7:51 AM      Result Value Range   MRSA, PCR NEGATIVE  NEGATIVE   Staphylococcus aureus POSITIVE (*) NEGATIVE   Comment:            The Xpert SA Assay (FDA     approved for NASAL specimens     in patients over 77 years of age),     is one component of     a comprehensive surveillance     program.  Test performance has     been validated by [employer] for patients greater     than or equal to 37 year old.     It is not intended     to diagnose infection nor to     guide or monitor treatment.  TYPE AND SCREEN     Status: None   Collection Time    03/23/13  9:08 AM      Result Value Range   ABO/RH(D) O POS     Antibody Screen NEG     Sample Expiration 03/26/2013    ABO/RH     Status: None   Collection Time    03/23/13  9:08 AM      Result Value Range   ABO/RH(D) O POS    CBC     Status: Abnormal   Collection Time    03/23/13 12:13 PM      Result Value Range   WBC 15.0 (*) 4.0 - 10.5  K/uL   RBC 3.53 (*) 3.87 - 5.11 MIL/uL   Hemoglobin 8.8 (*) 12.0 - 15.0 g/dL   Comment: REPEATED TO VERIFY     DELTA CHECK NOTED   HCT 26.6 (*) 36.0 - 46.0 %   MCV 75.4 (*) 78.0 - 100.0 fL   MCH 24.9 (*) 26.0 - 34.0 pg   MCHC 33.1  30.0 - 36.0 g/dL   RDW 20.0 (*) 11.5 - 15.5 %   Platelets 235  150 - 400 K/uL   Comment: RESULT REPEATED AND VERIFIED     DELTA CHECK NOTED  BASIC METABOLIC PANEL     Status: Abnormal   Collection Time    03/23/13  12:13 PM      Result Value Range   Sodium 139  137 - 147 mEq/L   Potassium 4.2  3.7 - 5.3 mEq/L   Chloride 105  96 - 112 mEq/L   CO2 20  19 - 32 mEq/L   Glucose, Bld 100 (*) 70 - 99 mg/dL   BUN 31 (*) 6 - 23 mg/dL   Creatinine, Ser 2.56 (*) 0.50 - 1.10 mg/dL   Calcium 7.3 (*) 8.4 - 10.5 mg/dL   GFR calc non Af Amer 16 (*) >90 mL/min   GFR calc Af Amer 19 (*) >90 mL/min   Comment: (NOTE)     The eGFR has been calculated using the CKD EPI equation.     This calculation has not been validated in all clinical situations.     eGFR's persistently <90 mL/min signify possible Chronic Kidney     Disease.  POCT I-STAT 3, BLOOD GAS (G3+)     Status: Abnormal   Collection Time    03/23/13 12:22 PM      Result Value Range   pH, Arterial 7.355  7.350 - 7.450   pCO2 arterial 38.7  35.0 - 45.0 mmHg   pO2, Arterial 256.0 (*) 80.0 - 100.0 mmHg   Bicarbonate 21.6  20.0 - 24.0 mEq/L   TCO2 23  0 - 100 mmol/L   O2 Saturation 100.0     Acid-base deficit 4.0 (*) 0.0 - 2.0 mmol/L   Patient temperature 98.6 F     Collection site ARTERIAL LINE     Drawn by Operator     Sample type ARTERIAL    LACTIC ACID, PLASMA     Status: None   Collection Time    03/23/13  2:30 PM      Result Value Range   Lactic Acid, Venous 2.1  0.5 - 2.2 mmol/L  POCT I-STAT 3, BLOOD GAS (G3+)     Status: Abnormal   Collection Time    03/24/13  4:06 AM      Result Value Range   pH, Arterial 7.369  7.350 - 7.450   pCO2 arterial 32.1 (*) 35.0 - 45.0 mmHg   pO2, Arterial 94.0  80.0 - 100.0 mmHg   Bicarbonate 18.3 (*) 20.0 - 24.0 mEq/L   TCO2 19  0 - 100 mmol/L   O2 Saturation 97.0     Acid-base deficit 6.0 (*) 0.0 - 2.0 mmol/L   Patient temperature 100.5 F     Collection site ARTERIAL LINE     Drawn by Nurse     Sample type ARTERIAL    CBC     Status: Abnormal   Collection Time    03/24/13  4:22 AM      Result Value Range   WBC 13.6 (*) 4.0 - 10.5 K/uL  RBC 3.20 (*) 3.87 - 5.11 MIL/uL   Hemoglobin 7.9 (*) 12.0 -  15.0 g/dL   HCT 23.8 (*) 36.0 - 46.0 %   MCV 74.4 (*) 78.0 - 100.0 fL   MCH 24.7 (*) 26.0 - 34.0 pg   MCHC 33.2  30.0 - 36.0 g/dL   RDW 20.2 (*) 11.5 - 15.5 %   Platelets 216  150 - 400 K/uL  BASIC METABOLIC PANEL     Status: Abnormal   Collection Time    03/24/13  4:22 AM      Result Value Range   Sodium 141  137 - 147 mEq/L   Potassium 4.6  3.7 - 5.3 mEq/L   Chloride 108  96 - 112 mEq/L   CO2 17 (*) 19 - 32 mEq/L   Glucose, Bld 99  70 - 99 mg/dL   BUN 41 (*) 6 - 23 mg/dL   Creatinine, Ser 3.74 (*) 0.50 - 1.10 mg/dL   Calcium 7.1 (*) 8.4 - 10.5 mg/dL   GFR calc non Af Amer 10 (*) >90 mL/min   GFR calc Af Amer 12 (*) >90 mL/min   Comment: (NOTE)     The eGFR has been calculated using the CKD EPI equation.     This calculation has not been validated in all clinical situations.     eGFR's persistently <90 mL/min signify possible Chronic Kidney     Disease.    Imaging / Studies: Ct Abdomen Pelvis W Contrast  03/22/2013   CLINICAL DATA:  Diffuse abdominal pain, nausea and vomiting  EXAM: CT ABDOMEN AND PELVIS WITH CONTRAST  TECHNIQUE: Multidetector CT imaging of the abdomen and pelvis was performed using the standard protocol following bolus administration of intravenous contrast.  CONTRAST:  145m OMNIPAQUE IOHEXOL 300 MG/ML  SOLN  COMPARISON:  Lumbar spine MRI 10/19/2009  FINDINGS: Lower Chest: Mild subpleural reticulation noted in the lung bases. Additionally, there is some mild dependent atelectasis. No definite pulmonary nodule or focal consolidation. Visualized cardiac structures within normal limits for size. Mildly patulous distal esophagus containing ingested oral contrast material.  Abdomen: Patulous and mildly distended stomach. No focal mass lesion or wall thickening. Unremarkable duodenum, spleen and right adrenal gland. 1.7 x 2.0 cm enhancing nodule arising from the lateral limb of the left adrenal gland. This is nonspecific by CT appearance. Diffuse low attenuation of the  hepatic parenchyma suggesting fatty infiltration. 7 mm well circumscribed hypo attenuating lesion in the inferior aspect of hepatic segment 5 is too small for accurate characterization but statistically highly likely a benign cyst. No additional discrete lesions identified. Gallbladder is unremarkable. No intra or extrahepatic biliary ductal dilatation. Colonic interposition noted incidentally anterior to the colon. Additionally, there is a small amount of low-attenuation perihepatic ascites. Small ascites is also noted in the left subdiaphragmatic station adjacent to the greater curvature of the stomach.  No hydronephrosis or nephrolithiasis. No enhancing renal mass. Several small left renal cortical defects suggest the sequelae of prior scarring.  Colonic diverticular disease without CT evidence of active inflammation. The colon is largely decompressed. Several loops of abnormal dilated fluid-filled an edematous small bowel noted in the mid abdomen. There is associated edema and fluid within the mesentery and between the bowel loops. There are 2 focal transition points within the center of the collection of abnormal bowel loops best demonstrated on the coronal (series 4) images. Transition points are noted on image 27 and 35 of series 4. Findings are most consistent with a closed loop  obstruction. There is persistent enhancement of the bowel mucosa.  Pelvis: Moderate volume of free fluid in the anatomic pelvis. Surgical changes of prior hysterectomy.  Bones/Soft Tissues: No acute fracture or aggressive appearing lytic or blastic osseous lesion. Extensive multilevel degenerative disc disease and lower lumbar facet arthropathy.  Vascular: Extensive atherosclerotic vascular disease. No aneurysmal dilatation. Probable right renal and right external iliac stenoses.  IMPRESSION: 1. CT findings are consistent with high-grade closed loop small bowel obstruction. Two adjacent transition points noted the mid abdomen.  Differential considerations include volvulus and internal hernia. Associated significant focal mesenteric edema, interloop fluid and moderate free peritoneal fluid. The affected small bowel mucosa does continue to enhance. No free air, pneumatosis or portal venous gas. Recommend surgical consultation. 2. Colonic diverticular disease without CT evidence of active inflammation. 3. Nonspecific 2 cm enhancing left adrenal nodule. Statistically, this is likely a benign adenoma, however it is indeterminate by imaging and could represent a metastatic lesion if the patient has a known primary malignancy. If clinically warranted, adrenal protocol CT or MRI could further evaluate. 4. Hepatic steatosis. 5. Extensive multilevel degenerative disc disease and lower lumbar facet arthropathy. 6. Extensive atherosclerotic vascular disease with probable right renal and right external iliac stenoses. 7. Mild subpleural reticulation in the visualized lower lungs suggests early pulmonary fibrosis. Critical Value/emergent results were called by telephone at the time of interpretation on 03/22/2013 at 3:33 PM to Dr. Carmin Muskrat , who verbally acknowledged these results.   Electronically Signed   By: Jacqulynn Cadet M.D.   On: 03/22/2013 15:34   Dg Chest Portable 1 View  03/23/2013   CLINICAL DATA:  ET tube placement  EXAM: PORTABLE CHEST - 1 VIEW  COMPARISON:  DG CHEST 1V PORT dated 11/15/2009  FINDINGS: Low lung volumes. Cardiac silhouette is within the upper limits of normal. Endotracheal tube is appreciated with tip 4.7 mm above the carina projecting in the level of the clavicles. There is increased density projects in the lung bases. There is blunting of the costophrenic angles. Prominence of interstitial markings appreciated as well as areas of peribronchial cuffing. Atherosclerotic calcification identified within the aorta. The osseous structures unremarkable. An NG tube is appreciated tip not on the view of this study.   IMPRESSION: 1. Endotracheal tube which appears to be adequately positioned. 2. Interstitial infiltrate differential considerations are pulmonary edema versus an infectious or inflammatory infiltrate. 3. Small bilateral effusions left greater than right 4. Areas of asymmetric edema versus atelectasis versus infiltrate within the lung bases.   Electronically Signed   By: Margaree Mackintosh M.D.   On: 03/23/2013 13:14    Medications / Allergies: per chart  Antibiotics: Anti-infectives   Start     Dose/Rate Route Frequency Ordered Stop   03/24/13 0800  ciprofloxacin (CIPRO) IVPB 400 mg     400 mg 200 mL/hr over 60 Minutes Intravenous Every 24 hours 03/23/13 1241     03/23/13 1700  metroNIDAZOLE (FLAGYL) IVPB 500 mg     500 mg 100 mL/hr over 60 Minutes Intravenous Every 8 hours 03/23/13 1236     03/23/13 0730  [MAR Hold]  ciprofloxacin (CIPRO) IVPB 400 mg     (On MAR Hold since 03/23/13 0805)   400 mg 200 mL/hr over 60 Minutes Intravenous On call to O.R. 03/23/13 6948 03/23/13 0824   03/23/13 0730  [MAR Hold]  metroNIDAZOLE (FLAGYL) IVPB 500 mg     (On MAR Hold since 03/23/13 0805)   500 mg 100 mL/hr over 60  Minutes Intravenous On call to O.R. 03/23/13 6010 03/23/13 0910      Assessment/Plan 2cm left adrenal nodule-outpatient follow up Dehydration   Hypertension  Leukocytosis  Acute renal insufficiency High grade small bowel obstruction, strangulation of bowel, necrotic, no perforation S/p exploratory laparotomy with SBR Dr. Hulen Skains 03/23/13  -POD#1 -continue with NGT to low intermittent suction -NPO -IV hydration -pain control -continue with antibiotics -continue with foley for now -vent per CCM, appreciate assistance.   Erby Pian, [facility] Surgery Pager [phone] Office [phone]  03/24/2013 8:35 AM

## 2013-03-24 NOTE — Progress Notes (Signed)
In spite of all the volume that the patient has already received, I believe that she is still probably volume depleted. With her low hemoglobin and her tachycardia, I think one unit of PRBCs may help her.  Will order.  Kathryne Eriksson. Dahlia Bailiff, MD, [place] [phone] [facility] Surgery

## 2013-03-25 DIAGNOSIS — N179 Acute kidney failure, unspecified: Secondary | ICD-10-CM

## 2013-03-25 LAB — CBC
HCT: 25.2 % — ABNORMAL LOW (ref 36.0–46.0)
Hemoglobin: 8.4 g/dL — ABNORMAL LOW (ref 12.0–15.0)
MCH: 25 pg — ABNORMAL LOW (ref 26.0–34.0)
MCHC: 33.3 g/dL (ref 30.0–36.0)
MCV: 75 fL — ABNORMAL LOW (ref 78.0–100.0)
PLATELETS: 211 10*3/uL (ref 150–400)
RBC: 3.36 MIL/uL — ABNORMAL LOW (ref 3.87–5.11)
RDW: 18.9 % — AB (ref 11.5–15.5)
WBC: 13.3 10*3/uL — AB (ref 4.0–10.5)

## 2013-03-25 LAB — BASIC METABOLIC PANEL
BUN: 51 mg/dL — AB (ref 6–23)
CHLORIDE: 113 meq/L — AB (ref 96–112)
CO2: 17 mEq/L — ABNORMAL LOW (ref 19–32)
Calcium: 7.1 mg/dL — ABNORMAL LOW (ref 8.4–10.5)
Creatinine, Ser: 4.06 mg/dL — ABNORMAL HIGH (ref 0.50–1.10)
GFR, EST AFRICAN AMERICAN: 11 mL/min — AB (ref >90)
GFR, EST NON AFRICAN AMERICAN: 9 mL/min — AB (ref >90)
Glucose, Bld: 87 mg/dL (ref 70–99)
POTASSIUM: 4.7 meq/L (ref 3.7–5.3)
Sodium: 146 mEq/L (ref 137–147)

## 2013-03-25 LAB — TYPE AND SCREEN
ABO/RH(D): O POS
Antibody Screen: NEGATIVE
UNIT DIVISION: 0

## 2013-03-25 MED ORDER — SODIUM CHLORIDE 0.9 % IV SOLN
500.0000 mL | Freq: Once | INTRAVENOUS | Status: AC
  Administered 2013-03-25: 500 mL via INTRAVENOUS

## 2013-03-25 MED ORDER — SODIUM CHLORIDE 0.9 % IJ SOLN
3.0000 mL | Freq: Two times a day (BID) | INTRAMUSCULAR | Status: DC
  Administered 2013-03-25: 3 mL via INTRAVENOUS

## 2013-03-25 MED ORDER — SODIUM CHLORIDE 0.9 % IJ SOLN: 3.0000 mL | INTRAMUSCULAR | Status: DC | PRN

## 2013-03-25 NOTE — Progress Notes (Signed)
[facility]/Kristi Alexander Progress Note 2 Days Post-Op  Subjective: Patient sitting up in chair, looks a bit worn out.  Says that she feels better.  Objective: Vital signs in last 24 hours: Temp:  [98.1 F (36.7 C)-101.4 F (38.6 C)] 98.1 F (36.7 C) (01/30 0723) Pulse Rate:  [57-129] 57 (01/30 0700) Resp:  [18-40] 38 (01/30 0700) BP: (88-152)/(37-79) 150/79 mmHg (01/30 0700) SpO2:  [90 %-100 %] 95 % (01/30 0600) Arterial Line BP: (139-176)/(44-62) 139/44 mmHg (01/30 0700) FiO2 (%):  [40 %] 40 % (01/29 1000) Last BM Date: 03/21/13  Intake/Output from previous day: 01/29 0701 - 01/30 0700 In: 4070 [I.V.:3125; Blood:325; NG/GT:120; IV Piggyback:500] Out: 980 [Urine:930; Emesis/NG output:50] Intake/Output this shift:    General: No acute distress.  Still with decreased skin turgor.    Lungs: Coarse breath sounds on the right.  Especially with expiration.  Abd: Soft, some bowel sounds.  Minimal NGT output.  Extremities: Still decreased turgor.  No clinical signs of DVT.  Neuro: Intact  Lab Results:  @LABLAST2 (wbc:2,hgb:2,hct:2,plt:2) BMET  Recent Labs  03/24/13 0422 03/25/13 0405  NA 141 146  K 4.6 4.7  CL 108 113*  CO2 17* 17*  GLUCOSE 99 87  BUN 41* 51*  CREATININE 3.74* 4.06*  CALCIUM 7.1* 7.1*   PT/INR  Recent Labs  03/22/13 2100  LABPROT 12.3  INR 0.93   ABG  Recent Labs  03/23/13 1222 03/24/13 0406  PHART 7.355 7.369  HCO3 21.6 18.3*    Studies/Results: Dg Chest Portable 1 View  03/23/2013   CLINICAL DATA:  ET tube placement  EXAM: PORTABLE CHEST - 1 VIEW  COMPARISON:  DG CHEST 1V PORT dated 11/15/2009  FINDINGS: Low lung volumes. Cardiac silhouette is within the upper limits of normal. Endotracheal tube is appreciated with tip 4.7 mm above the carina projecting in the level of the clavicles. There is increased density projects in the lung bases. There is blunting of the costophrenic angles. Prominence of interstitial markings appreciated as well as areas of  peribronchial cuffing. Atherosclerotic calcification identified within the aorta. The osseous structures unremarkable. An NG tube is appreciated tip not on the view of this study.  IMPRESSION: 1. Endotracheal tube which appears to be adequately positioned. 2. Interstitial infiltrate differential considerations are pulmonary edema versus an infectious or inflammatory infiltrate. 3. Small bilateral effusions left greater than right 4. Areas of asymmetric edema versus atelectasis versus infiltrate within the lung bases.   Electronically Signed   By: Margaree Mackintosh M.D.   On: 03/23/2013 13:14    Anti-infectives: Anti-infectives   Start     Dose/Rate Route Frequency Ordered Stop   03/24/13 0800  ciprofloxacin (CIPRO) IVPB 400 mg     400 mg 200 mL/hr over 60 Minutes Intravenous Every 24 hours 03/23/13 1241     03/23/13 1700  metroNIDAZOLE (FLAGYL) IVPB 500 mg     500 mg 100 mL/hr over 60 Minutes Intravenous Every 8 hours 03/23/13 1236     03/23/13 0730  [MAR Hold]  ciprofloxacin (CIPRO) IVPB 400 mg     (On MAR Hold since 03/23/13 0805)   400 mg 200 mL/hr over 60 Minutes Intravenous On call to O.R. 03/23/13 0728 03/23/13 0824   03/23/13 0730  [MAR Hold]  metroNIDAZOLE (FLAGYL) IVPB 500 mg     (On MAR Hold since 03/23/13 0805)   500 mg 100 mL/hr over 60 Minutes Intravenous On call to O.R. 03/23/13 [id number] 03/23/13 0910      Assessment/Plan: s/p Procedure(s): EXPLORATORY LAPAROTOMY  SMALL BOWEL RESECTION Continue foley due to strict I&O and patient in ICU DC NGT.  Keep NPO Normal saline bolus, 500 cc.  LOS: 3 days   Kathryne Eriksson. Dahlia Bailiff, MD, FACS [phone] [facility] Surgery 03/25/2013

## 2013-03-25 NOTE — Evaluation (Signed)
Physical Therapy Evaluation Patient Details Name: Kristi Alexander MRN: [id number] DOB: 1925/07/08 Today's Date: 03/25/2013 Time: 1122-1253 PT Time Calculation (min): 91 min  PT Assessment / Plan / Recommendation History of Present Illness  Pt s/p exp. lap and resection of 4 foot section of necrotic bowel.  pt back to room intubated.  Extubated 1/29.  Clinical Impression  Pt admitted with/for exp lap due to SBO.  Pt currently limited functionally due to the problems listed. ( See problems list.)   Pt will benefit from PT to maximize function and safety in order to get ready for next venue listed below.     PT Assessment  Patient needs continued PT services    Follow Up Recommendations  SNF    Does the patient have the potential to tolerate intense rehabilitation      Barriers to Discharge        Equipment Recommendations  Other (comment) (TBA as pt progresses)    Recommendations for Other Services     Frequency Min 3X/week    Precautions / Restrictions Precautions Precautions: Fall   Pertinent Vitals/Pain VSS through out.  Pt showing s/s of fatigue.      Mobility  Transfers Overall transfer level: Needs assistance Transfers: Sit to/from Stand Sit to Stand: Min assist;+2 safety/equipment General transfer comment: cues for transfer safety, assist to come forward and up. Ambulation/Gait Ambulation/Gait assistance: Min assist;+2 safety/equipment Ambulation Distance (Feet): 100 Feet Assistive device:  (pushing w/c) Gait Pattern/deviations: Step-through pattern Gait velocity interpretation: Below normal speed for age/gender General Gait Details: mildly unsteady, but functional    Exercises     PT Diagnosis: Generalized weakness;Acute pain  PT Problem List: Decreased strength;Decreased activity tolerance;Decreased balance;Decreased mobility;Decreased knowledge of use of DME PT Treatment Interventions: DME instruction;Gait training;Stair training;Balance  training;Patient/family education     PT Goals(Current goals can be found in the care plan section) Acute Rehab PT Goals Patient Stated Goal: 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 PT Goal Formulation: With patient Time For Goal Achievement: 04/08/13 Potential to Achieve Goals: Good  Visit Information  Last PT Received On: 03/25/13 Assistance Needed: +1 (+2 for lines) History of Present Illness: Pt s/p exp. lap and resection of 4 foot section of necrotic bowel.  pt back to room intubated.  Extubated 1/29.       Prior [place] expects to be discharged to:: Private residence Living Arrangements: Spouse/significant other Available Help at Discharge: Available PRN/intermittently (pt is usually her husband's caregiver) Type of Home: House Home Layout: One level Prior Function Level of Independence: Independent Communication Communication: No difficulties    Cognition  Cognition Arousal/Alertness: Awake/alert Behavior During Therapy: WFL for tasks assessed/performed Overall Cognitive Status: Within Functional Limits for tasks assessed (but poor historian)    Extremity/Trunk Assessment Upper Extremity Assessment Upper Extremity Assessment: Generalized weakness Lower Extremity Assessment Lower Extremity Assessment: Generalized weakness;Overall [facility] for tasks assessed   Balance Balance Overall balance assessment: Needs assistance Sitting-balance support: No upper extremity supported;Single extremity supported Sitting balance-Leahy Scale: Fair Standing balance support: Bilateral upper extremity supported Standing balance-Leahy Scale: Fair  End of Session PT - End of Session Equipment Utilized During Treatment: Oxygen Activity Tolerance: Patient tolerated treatment well Patient left: in bed Nurse Communication: Mobility status  GP     Clayborn Milnes, [name] 03/25/2013, 12:48 PM 03/25/2013  Donnella Sham,  [place] [phone]  (pager)

## 2013-03-25 NOTE — Progress Notes (Signed)
Name: Kristi Alexander MRN: [id number] DOB: 1925-04-11    ADMISSION DATE:  03/22/2013 CONSULTATION DATE:  1/28  REFERRING MD :  Dr Hulen Skains PRIMARY SERVICE: CCS  CHIEF COMPLAINT:  Vent management   BRIEF PATIENT DESCRIPTION: 80 yo female with hx HTN admitted 1/27 with SBO.  Initially managed conservatively but cont to have worsening abd pain and increased WBC and was taken to OR 1/28 for exp lap and bowel resection.  Remained on vent post op and PCCM consulted.   SIGNIFICANT EVENTS / STUDIES:  1/27 admitted with SBO, conservative management  1/28 necrotic bowel suspected, to OR, 3-4 feet frankly necrotic bowel removed (no perforation)   LINES / TUBES: ETT 1/28>>> 1/29 L brachial aline 1/28>>>  CULTURES: Urine 1/28>>> negative  ANTIBIOTICS: Flagyl 1/28>>> Cipro 1/29 >>   Subjective/Interval events:  Up to chair, feels better but overall weak UOP has picked up some, 30cc/h  VITAL SIGNS: Temp:  [98.1 F (36.7 C)-101.4 F (38.6 C)] 98.1 F (36.7 C) (01/30 0723) Pulse Rate:  [57-129] 110 (01/30 0800) Resp:  [18-40] 33 (01/30 0800) BP: (88-150)/(37-79) 141/63 mmHg (01/30 0800) SpO2:  [90 %-100 %] 97 % (01/30 0800) Arterial Line BP: (139-176)/(41-62) 159/41 mmHg (01/30 0800) FiO2 (%):  [40 %] 40 % (01/29 1000) HEMODYNAMICS:   VENTILATOR SETTINGS: Vent Mode:  [-] PSV;CPAP FiO2 (%):  [40 %] 40 % PEEP:  [5 cmH20] 5 cmH20 Pressure Support:  [5 cmH20] 5 cmH20 INTAKE / OUTPUT: Intake/Output     01/29 0701 - 01/30 0700 01/30 0701 - 01/31 0700   I.V. (mL/kg) 3125 (48.8) 125 (2)   Blood 325    NG/GT 120    IV Piggyback 500 200   Total Intake(mL/kg) 4070 (63.6) 325 (5.1)   Urine (mL/kg/hr) 960 (0.6) 30 (0.3)   Emesis/NG output 50 (0) 50 (0.4)   Other     Total Output 1010 80   Net +3060 +245          PHYSICAL EXAMINATION: General:  Thin, elderly female, NAD Neuro:  Awake, follows commands, RASS 0 HEENT:  Mm dry, poor dentition, NGT in place Cardiovascular:  s1s2  rrr Lungs:  resps even non labored on full support  Abdomen:  Distended, tender, dressing c/d  Musculoskeletal:  Warm and dry, no sig edema   LABS:  CBC  Recent Labs Lab 03/23/13 1213 03/24/13 0422 03/25/13 0405  WBC 15.0* 13.6* 13.3*  HGB 8.8* 7.9* 8.4*  HCT 26.6* 23.8* 25.2*  PLT 235 216 211   Coag's  Recent Labs Lab 03/22/13 2100  INR 0.93   BMET  Recent Labs Lab 03/23/13 1213 03/24/13 0422 03/25/13 0405  NA 139 141 146  K 4.2 4.6 4.7  CL 105 108 113*  CO2 20 17* 17*  BUN 31* 41* 51*  CREATININE 2.56* 3.74* 4.06*  GLUCOSE 100* 99 87   Electrolytes  Recent Labs Lab 03/23/13 1213 03/24/13 0422 03/25/13 0405  CALCIUM 7.3* 7.1* 7.1*   Sepsis Markers  Recent Labs Lab 03/22/13 1406 03/23/13 0312 03/23/13 1430  LATICACIDVEN 7.10* 5.5* 2.1   ABG  Recent Labs Lab 03/23/13 1222 03/24/13 0406  PHART 7.355 7.369  PCO2ART 38.7 32.1*  PO2ART 256.0* 94.0   Liver Enzymes  Recent Labs Lab 03/22/13 1248  AST 20  ALT 11  ALKPHOS 98  BILITOT 0.6  ALBUMIN 3.3*   Cardiac Enzymes No results found for this basename: TROPONINI, PROBNP,  in the last 168 hours Glucose  Recent Labs Lab 03/22/13 2338  03/23/13 0341  GLUCAP 208* 172*    Imaging Dg Chest Portable 1 View  03/23/2013   CLINICAL DATA:  ET tube placement  EXAM: PORTABLE CHEST - 1 VIEW  COMPARISON:  DG CHEST 1V PORT dated 11/15/2009  FINDINGS: Low lung volumes. Cardiac silhouette is within the upper limits of normal. Endotracheal tube is appreciated with tip 4.7 mm above the carina projecting in the level of the clavicles. There is increased density projects in the lung bases. There is blunting of the costophrenic angles. Prominence of interstitial markings appreciated as well as areas of peribronchial cuffing. Atherosclerotic calcification identified within the aorta. The osseous structures unremarkable. An NG tube is appreciated tip not on the view of this study.  IMPRESSION: 1.  Endotracheal tube which appears to be adequately positioned. 2. Interstitial infiltrate differential considerations are pulmonary edema versus an infectious or inflammatory infiltrate. 3. Small bilateral effusions left greater than right 4. Areas of asymmetric edema versus atelectasis versus infiltrate within the lung bases.   Electronically Signed   By: Margaree Mackintosh M.D.   On: 03/23/2013 13:14     ASSESSMENT / PLAN:  PULMONARY Acute Respiratory Failure due to bowel ischemia and severe sepsis P:   pulm hygiene  CARDIOVASCULAR Hx HTN - currently borderline hypotension  Severe Sepsis without shock, lactate cleared over 24 hours P:  Holding metoprolol for now, add back home regimen once taking PO Gentle volume  RENAL Acute renal failure, ATN, now non-oliguric 1/30 Lactic acidosis, resolved P:   Follow UOP and S Cr, hopefully will plateau and then improve without intervention Hold off on renal consult for now - she appears to be overall dry, do not want to entertain lasix trial yet  GASTROINTESTINAL SBO -- s/p exp lap 1/28 with 4 ft necrotic bowel  P:   PPI ordered NPO until OK with CCS Will leave NGT in place  HEMATOLOGIC No active issue  P:  F/u cbc   INFECTIOUS Necrotic bowel - without perforation  P:   Empiric abx - cipro/ flagyl   ENDOCRINE No active issue    P:   Trend glucose on bmet   NEUROLOGIC Post op pain  P:   PRN fentanyl    I have personally obtained a history, examined the patient, evaluated laboratory and imaging results, formulated the assessment and plan and placed orders.  CRITICAL CARE: The patient is critically ill with multiple organ systems failure and requires high complexity decision making for assessment and support, frequent evaluation and titration of therapies, application of advanced monitoring technologies and extensive interpretation of multiple databases. Critical Care Time devoted to patient care services described in this note  is 31 minutes.   *Care during the described time interval was provided by me and/or other providers on the critical care team. I have reviewed this patient's available data, including medical history, events of note, physical examination and test results as part of my evaluation.   Baltazar Apo, MD, PhD 03/25/2013, 8:47 AM [facility] [phone] or if no answer [phone]

## 2013-03-25 NOTE — Clinical Documentation Improvement (Signed)
Presents with Small Bowel Obstruction and necrotic bowel; resected.    Sepsis noted in 1/29 and 1/30 progress notes  No mention of sepsis until these dates  Met SIRS criteria on admission   Please clarify if you feel:  Sepsis was present or evolving on admission             Sepsis acquired after admit             Other   Thank You, Zoila Shutter ,RN Clinical Documentation Specialist:  [place] [facility]

## 2013-03-26 ENCOUNTER — Inpatient Hospital Stay (HOSPITAL_COMMUNITY): Payer: Medicare Other

## 2013-03-26 DIAGNOSIS — N179 Acute kidney failure, unspecified: Secondary | ICD-10-CM

## 2013-03-26 LAB — CBC WITH DIFFERENTIAL/PLATELET
BASOS ABS: 0 10*3/uL (ref 0.0–0.1)
Basophils Relative: 0 % (ref 0–1)
EOS ABS: 0 10*3/uL (ref 0.0–0.7)
EOS PCT: 0 % (ref 0–5)
HCT: 27.1 % — ABNORMAL LOW (ref 36.0–46.0)
Hemoglobin: 8.8 g/dL — ABNORMAL LOW (ref 12.0–15.0)
LYMPHS ABS: 0.9 10*3/uL (ref 0.7–4.0)
Lymphocytes Relative: 5 % — ABNORMAL LOW (ref 12–46)
MCH: 25.1 pg — AB (ref 26.0–34.0)
MCHC: 32.5 g/dL (ref 30.0–36.0)
MCV: 77.4 fL — ABNORMAL LOW (ref 78.0–100.0)
Monocytes Absolute: 1.4 10*3/uL — ABNORMAL HIGH (ref 0.1–1.0)
Monocytes Relative: 9 % (ref 3–12)
Neutro Abs: 13.5 10*3/uL — ABNORMAL HIGH (ref 1.7–7.7)
Neutrophils Relative %: 86 % — ABNORMAL HIGH (ref 43–77)
PLATELETS: 219 10*3/uL (ref 150–400)
RBC: 3.5 MIL/uL — AB (ref 3.87–5.11)
RDW: 20.2 % — AB (ref 11.5–15.5)
WBC: 15.8 10*3/uL — AB (ref 4.0–10.5)

## 2013-03-26 LAB — BASIC METABOLIC PANEL
BUN: 53 mg/dL — ABNORMAL HIGH (ref 6–23)
CALCIUM: 7.7 mg/dL — AB (ref 8.4–10.5)
CO2: 14 mEq/L — ABNORMAL LOW (ref 19–32)
Chloride: 115 mEq/L — ABNORMAL HIGH (ref 96–112)
Creatinine, Ser: 3.71 mg/dL — ABNORMAL HIGH (ref 0.50–1.10)
GFR, EST AFRICAN AMERICAN: 12 mL/min — AB (ref >90)
GFR, EST NON AFRICAN AMERICAN: 10 mL/min — AB (ref >90)
Glucose, Bld: 42 mg/dL — CL (ref 70–99)
Potassium: 4.7 mEq/L (ref 3.7–5.3)
SODIUM: 147 meq/L (ref 137–147)

## 2013-03-26 LAB — GLUCOSE, CAPILLARY
GLUCOSE-CAPILLARY: 153 mg/dL — AB (ref 70–99)
GLUCOSE-CAPILLARY: 134 mg/dL — AB (ref 70–99)

## 2013-03-26 LAB — PHOSPHORUS: PHOSPHORUS: 7.2 mg/dL — AB (ref 2.3–4.6)

## 2013-03-26 LAB — MAGNESIUM: MAGNESIUM: 1.6 mg/dL (ref 1.5–2.5)

## 2013-03-26 MED ORDER — SODIUM CHLORIDE 0.9 % IJ SOLN
10.0000 mL | INTRAMUSCULAR | Status: DC | PRN
  Administered 2013-03-30 – 2013-04-05 (×5): 10 mL
  Administered 2013-04-07: 30 mL

## 2013-03-26 MED ORDER — FAT EMULSION 20 % IV EMUL
250.0000 mL | INTRAVENOUS | Status: AC
  Administered 2013-03-26: 250 mL via INTRAVENOUS
  Filled 2013-03-26: qty 250

## 2013-03-26 MED ORDER — INSULIN ASPART 100 UNIT/ML ~~LOC~~ SOLN
0.0000 [IU] | Freq: Four times a day (QID) | SUBCUTANEOUS | Status: DC
  Administered 2013-03-26 (×2): 1 [IU] via SUBCUTANEOUS
  Administered 2013-03-27 – 2013-03-28 (×6): 2 [IU] via SUBCUTANEOUS
  Administered 2013-03-29: 5 [IU] via SUBCUTANEOUS
  Administered 2013-03-29: 2 [IU] via SUBCUTANEOUS

## 2013-03-26 MED ORDER — TRACE MINERALS CR-CU-F-FE-I-MN-MO-SE-ZN IV SOLN
INTRAVENOUS | Status: AC
  Administered 2013-03-26: 18:00:00 via INTRAVENOUS
  Filled 2013-03-26: qty 1000

## 2013-03-26 MED ORDER — METOPROLOL TARTRATE 1 MG/ML IV SOLN
5.0000 mg | Freq: Four times a day (QID) | INTRAVENOUS | Status: DC
  Administered 2013-03-26 – 2013-04-05 (×40): 5 mg via INTRAVENOUS
  Filled 2013-03-26 (×45): qty 5

## 2013-03-26 MED ORDER — DEXTROSE 50 % IV SOLN
50.0000 mL | Freq: Once | INTRAVENOUS | Status: AC | PRN
  Administered 2013-03-26: 50 mL via INTRAVENOUS

## 2013-03-26 MED ORDER — MAGNESIUM SULFATE IN D5W 10-5 MG/ML-% IV SOLN
1.0000 g | Freq: Once | INTRAVENOUS | Status: AC
  Administered 2013-03-26: 1 g via INTRAVENOUS
  Filled 2013-03-26: qty 100

## 2013-03-26 MED ORDER — SODIUM CHLORIDE 0.9 % IV SOLN
INTRAVENOUS | Status: DC
  Administered 2013-03-26 – 2013-03-29 (×2): via INTRAVENOUS
  Administered 2013-03-30: 20 mL/h via INTRAVENOUS
  Administered 2013-03-31: via INTRAVENOUS
  Administered 2013-04-05 – 2013-04-10 (×2): 20 mL/h via INTRAVENOUS
  Administered 2013-04-11: 16:00:00 via INTRAVENOUS
  Administered 2013-04-12 – 2013-04-14 (×2): 20 mL/h via INTRAVENOUS
  Administered 2013-04-15 – 2013-04-18 (×2): via INTRAVENOUS

## 2013-03-26 MED ORDER — LEVALBUTEROL HCL 0.63 MG/3ML IN NEBU
0.6300 mg | INHALATION_SOLUTION | RESPIRATORY_TRACT | Status: DC | PRN
  Administered 2013-03-26 – 2013-04-13 (×2): 0.63 mg via RESPIRATORY_TRACT
  Filled 2013-03-26: qty 3

## 2013-03-26 MED ORDER — DEXTROSE-NACL 5-0.9 % IV SOLN
INTRAVENOUS | Status: DC
  Administered 2013-03-26: 75 mL/h via INTRAVENOUS

## 2013-03-26 MED ORDER — SODIUM CHLORIDE 0.9 % IJ SOLN
10.0000 mL | Freq: Two times a day (BID) | INTRAMUSCULAR | Status: DC
Start: 2013-03-26 — End: 2013-04-19
  Administered 2013-03-26 – 2013-04-04 (×16): 10 mL
  Administered 2013-04-04: 30 mL
  Administered 2013-04-05: 10 mL
  Administered 2013-04-05: 20 mL
  Administered 2013-04-06: 10 mL
  Administered 2013-04-06: 20 mL
  Administered 2013-04-07 (×2): 10 mL
  Administered 2013-04-07 – 2013-04-08 (×2): 20 mL
  Administered 2013-04-08 – 2013-04-09 (×3): 10 mL
  Administered 2013-04-10 – 2013-04-12 (×5): 20 mL
  Administered 2013-04-13: 40 mL
  Administered 2013-04-13 – 2013-04-14 (×2): 20 mL
  Administered 2013-04-14 – 2013-04-15 (×2): 10 mL
  Administered 2013-04-15: 20 mL
  Administered 2013-04-16: 10 mL
  Administered 2013-04-16: 30 mL
  Administered 2013-04-17: 20 mL
  Administered 2013-04-17 – 2013-04-19 (×4): 10 mL

## 2013-03-26 MED ORDER — DEXTROSE 5 % IV SOLN
INTRAVENOUS | Status: AC
Start: 2013-03-26 — End: 2013-03-26
  Administered 2013-03-26: 09:00:00 via INTRAVENOUS

## 2013-03-26 MED ORDER — DEXTROSE 50 % IV SOLN
INTRAVENOUS | Status: AC
  Administered 2013-03-26: 50 mL via INTRAVENOUS
  Filled 2013-03-26: qty 50

## 2013-03-26 NOTE — Progress Notes (Signed)
PARENTERAL NUTRITION CONSULT NOTE - FOLLOW UP  Pharmacy Consult for TPN Indication: prolonged ileus  Allergies  Allergen Reactions  . Penicillins Swelling    Patient Measurements: Height: 5\' 2"  (157.5 cm) Weight: 141 lb 1.5 oz (64 kg) IBW/kg (Calculated) : 50.1  Vital Signs: Temp: 98.4 F (36.9 C) (01/31 1108) Temp src: Oral (01/31 1108) BP: 131/56 mmHg (01/31 1000) Pulse Rate: 33 (01/31 1000) Intake/Output from previous day: 01/30 0701 - 01/31 0700 In: 3098 [P.O.:120; I.V.:2478; IV Piggyback:500] Out: 0737 [Urine:1145; Emesis/NG output:50] Intake/Output from this shift:    Labs:  Recent Labs  03/24/13 0422 03/25/13 0405 03/26/13 0223  WBC 13.6* 13.3* 15.8*  HGB 7.9* 8.4* 8.8*  HCT 23.8* 25.2* 27.1*  PLT 216 211 219     Recent Labs  03/24/13 0422 03/25/13 0405 03/26/13 0223  NA 141 146 147  K 4.6 4.7 4.7  CL 108 113* 115*  CO2 17* 17* 14*  GLUCOSE 99 87 42*  BUN 41* 51* 53*  CREATININE 3.74* 4.06* 3.71*  CALCIUM 7.1* 7.1* 7.7*  MG  --   --  1.6  PHOS  --   --  7.2*   Estimated Creatinine Clearance: 9.7 ml/min (by C-G formula based on Cr of 3.71).    Recent Labs  03/26/13 0426  GLUCAP 153*    Medications:  Infusions:  . dextrose 50 mL/hr at 03/26/13 0926    Insulin Requirements in the past 24 hours:  none  Current Nutrition:  NPO  Assessment: 80 year old female admitted with SBO s/p ex lap and bowel resection on 1/28.  She is to begin TPN for prolonged ileus.  GI: SBO s/p ex-lap with bowel resection 1/28.  Abd Xray shows adynamic ileus vs. Partial SBO. To begin TPN. Endo: no history of DM. Glucose ok on BMET. Lytes:  K 4.7, Mag 1.6, Phos 7.2 Renal: SCr 3.7, decreasing today Pulm: ventimask Cards: BP ok, sinus tach Hepatobil: AST/ALT ok, Tbili ok Neuro: A&O ID:  On empiric GI coverage with Cipro/Flagyl. Afebrile, WBC 15.8.  Planning a 7 day course.  Cipro 1/28> Flagyl 1/28>   Best Practices: Lovenox TPN Access: PICC placed  1/31 TPN day#: 1   Nutritional Goals:  ~1600 kCal, ~60 grams of protein per day  Plan:  Replete Magnesium with 1gm IV bolus today Start Clinimix 5/15 (no electrolytes) at 30 ml/hr + 20% lipids at 10 ml/hr Check CMET, Mag, Phos with AM labs Check CBGs q6h, provide sensitive SSI for coverage as needed Decrease D5W to [facility] when TPN starts  Legrand Como, Pharm.D., BCPS, AAHIVP Clinical Pharmacist Phone: [phone] or [phone] 03/26/2013, 1:08 PM

## 2013-03-26 NOTE — Progress Notes (Signed)
3 Days Post-Op  Subjective: Responds appropriately, no flatus/bm/n/v, only complaint is left hand swelling  Objective: Vital signs in last 24 hours: Temp:  [97.5 F (36.4 C)-98.6 F (37 C)] 97.5 F (36.4 C) (01/31 0707) Pulse Rate:  [97-127] 127 (01/31 0700) Resp:  [20-38] 31 (01/31 0700) BP: (124-156)/(45-120) 141/54 mmHg (01/31 0700) SpO2:  [89 %-97 %] 94 % (01/31 0700) Arterial Line BP: (151-168)/(32-48) 151/32 mmHg (01/30 1100) FiO2 (%):  [50 %] 50 % (01/31 0240) Last BM Date: 03/21/13  Intake/Output from previous day: 01/30 0701 - 01/31 0700 In: 3098 [P.O.:120; I.V.:2478; IV Piggyback:500] Out: 4268 [Urine:1145; Emesis/NG output:50] Intake/Output this shift:    General appearance: slightly tachypneic but appears comfortable Resp: diminished breath sounds bibasilar Cardio: tachycardic rr GI: distended, no bs wound clean without infection Left hand edema  Lab Results:   Recent Labs  03/25/13 0405 03/26/13 0223  WBC 13.3* 15.8*  HGB 8.4* 8.8*  HCT 25.2* 27.1*  PLT 211 219   BMET  Recent Labs  03/25/13 0405 03/26/13 0223  NA 146 147  K 4.7 4.7  CL 113* 115*  CO2 17* 14*  GLUCOSE 87 42*  BUN 51* 53*  CREATININE 4.06* 3.71*  CALCIUM 7.1* 7.7*   PT/INR No results found for this basename: LABPROT, INR,  in the last 72 hours ABG  Recent Labs  03/23/13 1222 03/24/13 0406  PHART 7.355 7.369  HCO3 21.6 18.3*    Studies/Results: No results found.  Anti-infectives: Anti-infectives   Start     Dose/Rate Route Frequency Ordered Stop   03/24/13 0800  ciprofloxacin (CIPRO) IVPB 400 mg     400 mg 200 mL/hr over 60 Minutes Intravenous Every 24 hours 03/23/13 1241     03/23/13 1700  metroNIDAZOLE (FLAGYL) IVPB 500 mg     500 mg 100 mL/hr over 60 Minutes Intravenous Every 8 hours 03/23/13 1236     03/23/13 0730  [MAR Hold]  ciprofloxacin (CIPRO) IVPB 400 mg     (On MAR Hold since 03/23/13 0805)   400 mg 200 mL/hr over 60 Minutes Intravenous On call to  O.R. 03/23/13 0728 03/23/13 0824   03/23/13 0730  [MAR Hold]  metroNIDAZOLE (FLAGYL) IVPB 500 mg     (On MAR Hold since 03/23/13 0805)   500 mg 100 mL/hr over 60 Minutes Intravenous On call to O.R. 03/23/13 3419 03/23/13 0910      Assessment/Plan: POD 3 sbr 1. Neuro- cont iv pain meds until taking po 2. cv- restart lopressor today, tachycardic, do not think she is volume depleted by vitals/uop/labs 3. pulm toilet 4. Ng came out, still distended with no bowel function, will check film to make sure not a significant ileus with ng that might need to be replaced, remain npo, if continues through weekend may need tpn 5. Cr improving, has uop, likely resolving atn, I think placing picc would be helpful at this point also and will ask to do today 6. Sq heparin, scds 7. Appreciate ccm assistance 8. Leave foley for strict i/os in icu and renal issues 9 continue abx for 7 day postop course  [facility] 03/26/2013

## 2013-03-26 NOTE — Progress Notes (Signed)
Peripherally Inserted Central Catheter/Midline Placement  The IV Nurse has discussed with the patient and/or persons authorized to consent for the patient, the purpose of this procedure and the potential benefits and risks involved with this procedure.  The benefits include less needle sticks, lab draws from the catheter and patient may be discharged home with the catheter.  Risks include, but not limited to, infection, bleeding, blood clot (thrombus formation), and puncture of an artery; nerve damage and irregular heat beat.  Alternatives to this procedure were also discussed.  PICC/Midline Placement Documentation  PICC Triple Lumen 60/63/01 PICC Right Basilic 41 cm 3 cm (Active)  Indication for Insertion or Continuance of Line Prolonged intravenous therapies 03/26/2013 11:00 AM  Exposed Catheter (cm) 3 cm 03/26/2013 11:00 AM  Site Assessment Clean;Dry;Intact 03/26/2013 11:00 AM  Lumen #1 Status Flushed;Saline locked;Blood return noted 03/26/2013 11:00 AM  Lumen #2 Status Flushed;Saline locked;Blood return noted 03/26/2013 11:00 AM  Lumen #3 Status Flushed;Saline locked;Blood return noted 03/26/2013 11:00 AM  Dressing Type Occlusive 03/26/2013 11:00 AM  Dressing Intervention New dressing 03/26/2013 11:00 AM  Dressing Change Due 04/02/13 03/26/2013 11:00 AM       [facility] Payment 03/26/2013, 11:13 AM

## 2013-03-26 NOTE — Progress Notes (Signed)
Hypoglycemic Event  CBG: 42  Treatment: D50 IV 50 mL  Symptoms: None  Follow-up CBG: Time: 0430 CBG Result: 155 Possible Reasons for Event: Unknown  Comments/MD notified:Dr E. Deterding to be notified    Sigmund Hazel  Remember to initiate Hypoglycemia Order Set & complete

## 2013-03-26 NOTE — Progress Notes (Signed)
Name: Kristi Alexander MRN: [id number] DOB: 12-Mar-1925    ADMISSION DATE:  03/22/2013 CONSULTATION DATE:  1/28  REFERRING MD :  Dr Hulen Skains PRIMARY SERVICE: CCS  CHIEF COMPLAINT:  Vent and medical management   BRIEF PATIENT DESCRIPTION: 80 yo female with hx HTN admitted 1/27 with SBO.  Initially managed conservatively but cont to have worsening abd pain and increased WBC and was taken to OR 1/28 for exp lap and bowel resection.  Remained on vent post op and PCCM consulted.   SIGNIFICANT EVENTS / STUDIES:  1/27 admitted with SBO, conservative management  1/28 necrotic bowel suspected, to OR, 3-4 feet frankly necrotic bowel removed (no perforation)   LINES / TUBES: ETT 1/28>>> 1/29 L brachial aline 1/28>>>  CULTURES: Urine 1/28>>> negative  ANTIBIOTICS: Flagyl 1/28>>> Cipro 1/29 >>   Subjective/Interval events:  Up to chair, feels better but overall weak Renal function improving. Hypoglycemic overnight  VITAL SIGNS: Temp:  [97.5 F (36.4 C)-98.6 F (37 C)] 97.5 F (36.4 C) (01/31 0707) Pulse Rate:  [97-127] 127 (01/31 0700) Resp:  [20-38] 31 (01/31 0700) BP: (124-156)/(45-120) 141/54 mmHg (01/31 0700) SpO2:  [89 %-97 %] 94 % (01/31 0700) Arterial Line BP: (151-155)/(32-48) 151/32 mmHg (01/30 1100) FiO2 (%):  [50 %] 50 % (01/31 0240) HEMODYNAMICS:   VENTILATOR SETTINGS: Vent Mode:  [-]  FiO2 (%):  [50 %] 50 % INTAKE / OUTPUT: Intake/Output     01/30 0701 - 01/31 0700 01/31 0701 - 02/01 0700   P.O. 120    I.V. (mL/kg) 2478 (38.7)    Blood     NG/GT     IV Piggyback 500    Total Intake(mL/kg) 3098 (48.4)    Urine (mL/kg/hr) 1145 (0.7)    Emesis/NG output 50 (0)    Total Output 1195     Net +1903          Stool Occurrence 1 x      PHYSICAL EXAMINATION: General:  Thin, elderly female, NAD Neuro:  Awake, follows commands, RASS +1 HEENT:  Mm dry, poor dentition, NGT in place Cardiovascular:  s1s2 rrr Lungs:  resps even non labored on full support  Abdomen:   Distended, tender, dressing c/d  Musculoskeletal:  Warm and dry, no sig edema   LABS:  CBC  Recent Labs Lab 03/24/13 0422 03/25/13 0405 03/26/13 0223  WBC 13.6* 13.3* 15.8*  HGB 7.9* 8.4* 8.8*  HCT 23.8* 25.2* 27.1*  PLT 216 211 219   Coag's  Recent Labs Lab 03/22/13 2100  INR 0.93   BMET  Recent Labs Lab 03/24/13 0422 03/25/13 0405 03/26/13 0223  NA 141 146 147  K 4.6 4.7 4.7  CL 108 113* 115*  CO2 17* 17* 14*  BUN 41* 51* 53*  CREATININE 3.74* 4.06* 3.71*  GLUCOSE 99 87 42*   Electrolytes  Recent Labs Lab 03/24/13 0422 03/25/13 0405 03/26/13 0223  CALCIUM 7.1* 7.1* 7.7*   Sepsis Markers  Recent Labs Lab 03/22/13 1406 03/23/13 0312 03/23/13 1430  LATICACIDVEN 7.10* 5.5* 2.1   ABG  Recent Labs Lab 03/23/13 1222 03/24/13 0406  PHART 7.355 7.369  PCO2ART 38.7 32.1*  PO2ART 256.0* 94.0   Liver Enzymes  Recent Labs Lab 03/22/13 1248  AST 20  ALT 11  ALKPHOS 98  BILITOT 0.6  ALBUMIN 3.3*   Cardiac Enzymes No results found for this basename: TROPONINI, PROBNP,  in the last 168 hours Glucose  Recent Labs Lab 03/22/13 2338 03/23/13 0341 03/26/13 0426  GLUCAP 208*  172* 153*    Imaging Dg Chest Port 1 View  03/26/2013   CLINICAL DATA:  Tachypnea, hypertension  EXAM: PORTABLE CHEST - 1 VIEW  COMPARISON:  Portable exam 0846 hr compared to 03/23/2013  FINDINGS: Upper normal heart size.  Decreased lung volumes versus previous exam with bibasilar atelectasis.  Progressive bilateral perihilar and basilar infiltrates.  Small left pleural effusion.  No pneumothorax.  Atherosclerotic calcification aortic arch.  IMPRESSION: Increased perihilar and basilar infiltrates bilaterally with mild bibasilar atelectasis and small left pleural effusion.   Electronically Signed   By: Lavonia Dana M.D.   On: 03/26/2013 09:02   Dg Abd Portable 1v  03/26/2013   CLINICAL DATA:  Status post exploratory laparotomy and small bowel resection on 03/23/2013.  Evaluate for ileus.  EXAM: PORTABLE ABDOMEN - 1 VIEW  COMPARISON:  CT, 03/22/2013  FINDINGS: There are dilated loops small bowel in the central abdomen. No colonic distention is seen. This may reflect a diffuse post operative adynamic ileus or a partial small bowel obstruction. The former is favored.  Surgical skin staples are noted along the anterior mid and lower abdominal wall.  IMPRESSION: Dilated small bowel loops consistent with either a post operative adynamic ileus or a partial obstruction.   Electronically Signed   By: Lajean Manes M.D.   On: 03/26/2013 09:09   ASSESSMENT / PLAN:  PULMONARY Acute Respiratory Failure due to bowel ischemia and severe sepsis P:   - Pulm hygiene. - Titrate O2 for sats.  CARDIOVASCULAR Hx HTN - currently borderline hypotension  Severe Sepsis without shock, lactate cleared over 24 hours P:  - Restarting beta blockers for HTN and tachycardia, likely withdrawal tachy. - Gentle hydration.  RENAL Acute renal failure, ATN, now non-oliguric 1/30 Lactic acidosis, resolved P:   - Renal function improving but now mixed anion gap and NAG metabolic acidosis with hyperchloremia.  Will change NS to D5W for both hypoglycemia and hyperchloremia. - Start TPN. - Renal function and UOP improving, will monitor for now.  GASTROINTESTINAL SBO -- s/p exp lap 1/28 with 4 ft necrotic bowel  P:   - PPI ordered. - Start TPN due to ileus. - Pulled out NGT, will defer to surgery whether needed or now.  HEMATOLOGIC No active issue  P:  - F/u cbc.  INFECTIOUS Necrotic bowel - without perforation  P:   - Empiric abx - cipro/ flagyl as ordered.  ENDOCRINE No active issue    P:   - Trend glucose on bmet.  NEUROLOGIC Post op pain  P:   - PRN fentanyl.  I have personally obtained a history, examined the patient, evaluated laboratory and imaging results, formulated the assessment and plan and placed orders.  CRITICAL CARE: The patient is critically ill with  multiple organ systems failure and requires high complexity decision making for assessment and support, frequent evaluation and titration of therapies, application of advanced monitoring technologies and extensive interpretation of multiple databases. Critical Care Time devoted to patient care services described in this note is 31 minutes.   *Care during the described time interval was provided by me and/or other providers on the critical care team. I have reviewed this patient's available data, including medical history, events of note, physical examination and test results as part of my evaluation.  Rush Farmer, M.D. [facility]monary/Critical Care Medicine. Pager: [phone]. After hours pager: [phone].

## 2013-03-27 ENCOUNTER — Inpatient Hospital Stay (HOSPITAL_COMMUNITY): Payer: Medicare Other

## 2013-03-27 LAB — POCT I-STAT 3, ART BLOOD GAS (G3+)
Acid-base deficit: 9 mmol/L — ABNORMAL HIGH (ref 0.0–2.0)
Acid-base deficit: 9 mmol/L — ABNORMAL HIGH (ref 0.0–2.0)
Acid-base deficit: 9 mmol/L — ABNORMAL HIGH (ref 0.0–2.0)
BICARBONATE: 18 meq/L — AB (ref 20.0–24.0)
Bicarbonate: 18.7 mEq/L — ABNORMAL LOW (ref 20.0–24.0)
Bicarbonate: 16 mEq/L — ABNORMAL LOW (ref 20.0–24.0)
O2 SAT: 97 %
O2 SAT: 91 %
O2 Saturation: 96 %
PCO2 ART: 49.8 mmHg — AB (ref 35.0–45.0)
PCO2 ART: 30.3 mmHg — AB (ref 35.0–45.0)
PH ART: 7.226 — AB (ref 7.350–7.450)
PH ART: 7.329 — AB (ref 7.350–7.450)
PO2 ART: 113 mmHg — AB (ref 80.0–100.0)
PO2 ART: 97 mmHg (ref 80.0–100.0)
Patient temperature: 97.7
Patient temperature: 97.1
Patient temperature: 97.7
TCO2: 20 mmol/L (ref 0–100)
TCO2: 19 mmol/L (ref 0–100)
TCO2: 17 mmol/L (ref 0–100)
pCO2 arterial: 43.1 mmHg (ref 35.0–45.0)
pH, Arterial: 7.18 — CL (ref 7.350–7.450)
pO2, Arterial: 63 mmHg — ABNORMAL LOW (ref 80.0–100.0)

## 2013-03-27 LAB — COMPREHENSIVE METABOLIC PANEL WITH GFR
ALT: 45 U/L — ABNORMAL HIGH (ref 0–35)
AST: 61 U/L — ABNORMAL HIGH (ref 0–37)
Albumin: 1.9 g/dL — ABNORMAL LOW (ref 3.5–5.2)
Alkaline Phosphatase: 101 U/L (ref 39–117)
BUN: 61 mg/dL — ABNORMAL HIGH (ref 6–23)
CO2: 18 meq/L — ABNORMAL LOW (ref 19–32)
Calcium: 8.3 mg/dL — ABNORMAL LOW (ref 8.4–10.5)
Chloride: 113 meq/L — ABNORMAL HIGH (ref 96–112)
Creatinine, Ser: 3.75 mg/dL — ABNORMAL HIGH (ref 0.50–1.10)
GFR calc Af Amer: 12 mL/min — ABNORMAL LOW
GFR calc non Af Amer: 10 mL/min — ABNORMAL LOW
Glucose, Bld: 159 mg/dL — ABNORMAL HIGH (ref 70–99)
Potassium: 4.7 meq/L (ref 3.7–5.3)
Sodium: 144 meq/L (ref 137–147)
Total Bilirubin: 0.6 mg/dL (ref 0.3–1.2)
Total Protein: 5.2 g/dL — ABNORMAL LOW (ref 6.0–8.3)

## 2013-03-27 LAB — CBC
HCT: 25.5 % — ABNORMAL LOW (ref 36.0–46.0)
Hemoglobin: 8.3 g/dL — ABNORMAL LOW (ref 12.0–15.0)
MCH: 24.7 pg — AB (ref 26.0–34.0)
MCHC: 32.5 g/dL (ref 30.0–36.0)
MCV: 75.9 fL — ABNORMAL LOW (ref 78.0–100.0)
Platelets: 230 10*3/uL (ref 150–400)
RBC: 3.36 MIL/uL — ABNORMAL LOW (ref 3.87–5.11)
RDW: 20.2 % — AB (ref 11.5–15.5)
WBC: 16 10*3/uL — ABNORMAL HIGH (ref 4.0–10.5)

## 2013-03-27 LAB — GLUCOSE, CAPILLARY
GLUCOSE-CAPILLARY: 160 mg/dL — AB (ref 70–99)
Glucose-Capillary: 105 mg/dL — ABNORMAL HIGH (ref 70–99)
Glucose-Capillary: 143 mg/dL — ABNORMAL HIGH (ref 70–99)

## 2013-03-27 LAB — PREALBUMIN: Prealbumin: 6.2 mg/dL — ABNORMAL LOW (ref 17.0–34.0)

## 2013-03-27 LAB — PHOSPHORUS: Phosphorus: 5.7 mg/dL — ABNORMAL HIGH (ref 2.3–4.6)

## 2013-03-27 LAB — LACTIC ACID, PLASMA: LACTIC ACID, VENOUS: 1.1 mmol/L (ref 0.5–2.2)

## 2013-03-27 LAB — TRIGLYCERIDES: Triglycerides: 99 mg/dL (ref <150)

## 2013-03-27 LAB — MAGNESIUM: Magnesium: 2 mg/dL (ref 1.5–2.5)

## 2013-03-27 MED ORDER — IOHEXOL 300 MG/ML  SOLN
25.0000 mL | INTRAMUSCULAR | Status: AC
  Administered 2013-03-27 (×2): 25 mL via ORAL

## 2013-03-27 MED ORDER — FENTANYL CITRATE 0.05 MG/ML IJ SOLN
100.0000 ug | Freq: Once | INTRAMUSCULAR | Status: AC
  Administered 2013-03-27: 100 ug via INTRAVENOUS

## 2013-03-27 MED ORDER — CHLORHEXIDINE GLUCONATE 0.12 % MT SOLN
15.0000 mL | Freq: Two times a day (BID) | OROMUCOSAL | Status: DC
  Administered 2013-03-27 – 2013-03-31 (×8): 15 mL via OROMUCOSAL
  Filled 2013-03-27 (×7): qty 15

## 2013-03-27 MED ORDER — BIOTENE DRY MOUTH MT LIQD
15.0000 mL | Freq: Four times a day (QID) | OROMUCOSAL | Status: DC
  Administered 2013-03-28 – 2013-03-31 (×14): 15 mL via OROMUCOSAL

## 2013-03-27 MED ORDER — ROCURONIUM BROMIDE 50 MG/5ML IV SOLN
50.0000 mg | Freq: Once | INTRAVENOUS | Status: AC
  Administered 2013-03-27: 50 mg via INTRAVENOUS
  Filled 2013-03-27: qty 5

## 2013-03-27 MED ORDER — MIDAZOLAM HCL 2 MG/2ML IJ SOLN
INTRAMUSCULAR | Status: AC
  Filled 2013-03-27: qty 2

## 2013-03-27 MED ORDER — ETOMIDATE 2 MG/ML IV SOLN
20.0000 mg | Freq: Once | INTRAVENOUS | Status: AC
  Administered 2013-03-27: 20 mg via INTRAVENOUS

## 2013-03-27 MED ORDER — FAT EMULSION 20 % IV EMUL
250.0000 mL | INTRAVENOUS | Status: AC
  Administered 2013-03-27: 250 mL via INTRAVENOUS
  Filled 2013-03-27: qty 250

## 2013-03-27 MED ORDER — CHLORHEXIDINE GLUCONATE 0.12 % MT SOLN
15.0000 mL | Freq: Two times a day (BID) | OROMUCOSAL | Status: DC
  Administered 2013-03-27: 15 mL via OROMUCOSAL
  Filled 2013-03-27: qty 15

## 2013-03-27 MED ORDER — TRACE MINERALS CR-CU-F-FE-I-MN-MO-SE-ZN IV SOLN
INTRAVENOUS | Status: AC
  Administered 2013-03-27: 18:00:00 via INTRAVENOUS
  Filled 2013-03-27: qty 2000

## 2013-03-27 MED ORDER — MIDAZOLAM HCL 2 MG/2ML IJ SOLN
2.0000 mg | Freq: Once | INTRAMUSCULAR | Status: AC
  Administered 2013-03-27: 2 mg via INTRAVENOUS

## 2013-03-27 NOTE — Progress Notes (Signed)
Patient up in chair, very lethargic with few scattered expiratory wheezes, present.  Dr. Nelda Marseille at bedside.  Notified of all parameters.  Orders rec'd.  Patient's daughter [name] called at [phone].  Spoke with daughter regarding mrs. Callins current condition.  Dr.  Nelda Marseille also spoke with daughter.  Daughter wishes for patient to entubated and placed on mechanical ventilation.  RT and Dr. Nelda Marseille at bedside.

## 2013-03-27 NOTE — Progress Notes (Signed)
Respiratory therapy note- Increased RR 24 per Dr. Chuck Hint with phone order.

## 2013-03-27 NOTE — Progress Notes (Signed)
PARENTERAL NUTRITION CONSULT NOTE - FOLLOW UP  Pharmacy Consult for TPN Indication: prolonged ileus  Allergies  Allergen Reactions  . Penicillins Swelling    Patient Measurements: Height: _0  (157.5 cm) Weight: 141 lb 1.5 oz (64 kg) IBW/kg (Calculated) : 50.1  Vital Signs: Temp: 97.9 F (36.6 C) (02/01 0720) Temp src: Oral (02/01 0720) BP: 130/53 mmHg (02/01 0800) Pulse Rate: 99 (02/01 0800) Intake/Output from previous day: 01/31 0701 - 02/01 0700 In: 1817.2 [I.V.:723.3; IV Piggyback:600; TPN:493.8] Out: 615 [Urine:615] Intake/Output from this shift: Total I/O In: 320 [I.V.:40; IV Piggyback:200; TPN:80] Out: 20 [Urine:20]  Labs:  Recent Labs  03/25/13 0405 03/26/13 0223 03/27/13 0351  WBC 13.3* 15.8* 16.0*  HGB 8.4* 8.8* 8.3*  HCT 25.2* 27.1* 25.5*  PLT 211 219 230     Recent Labs  03/25/13 0405 03/26/13 0223 03/27/13 0351  NA 146 147 144  K 4.7 4.7 4.7  CL 113* 115* 113*  CO2 17* 14* 18*  GLUCOSE 87 42* 159*  BUN 51* 53* 61*  CREATININE 4.06* 3.71* 3.75*  CALCIUM 7.1* 7.7* 8.3*  MG  --  1.6 2.0  PHOS  --  7.2* 5.7*  PROT  --   --  5.2*  ALBUMIN  --   --  1.9*  AST  --   --  61*  ALT  --   --  45*  ALKPHOS  --   --  101  BILITOT  --   --  0.6  TRIG  --   --  99   Estimated Creatinine Clearance: 9.6 ml/min (by C-G formula based on Cr of 3.75).    Recent Labs  03/26/13 1819 03/26/13 2352 03/27/13 0549  GLUCAP 134* 143* 160*    Medications:  Infusions:  . sodium chloride 20 mL/hr at 03/26/13 1850  . TPN (CLINIMIX) Adult without lytes 30 mL/hr at 03/26/13 1739   And  . fat emulsion 250 mL (03/26/13 1740)    Insulin Requirements in the past 24 hours:  4 units SSI  Current Nutrition:  NPO Clinimix 5/15 at 64m/hr + 20% Lipids at 183mhr - provides 991 KCal and 36gm protein in 24 hours  Assessment: [age]ear old female admitted with SBO s/p ex lap and bowel resection on 1/28.  She is to begin TPN for prolonged ileus.  GI: SBO  s/p ex-lap with bowel resection 1/28.  Abd Xray shows adynamic ileus vs. Partial SBO. TPN started 1/31.  Prealbumin 1/31 in process. Endo: no history of DM. CBGs 134-160 requiring 4 units SSI coverage. Lytes:  K 4.7, Mag 2, Phos 5.7.  No electrolytes are in her TPN formulation. Renal: SCr 3.8, stable today.  [facility] 1892/615 [facility] Cards: BP ok, sinus tach.  Triglycerides 99. Hepatobil: AST 61, ALT 45, Tbili/Alk Phos WNL. Neuro: A&O ID:  On empiric GI coverage with Cipro/Flagyl. Afebrile, WBC 16.  Planning a 7 day course.  Cipro 1/28> Flagyl 1/28>   Best Practices: Lovenox TPN Access: PICC placed 1/31 TPN day#: 2   Nutritional Goals:  ~1600 kCal, ~60 grams of protein per day  Plan:   Increase Clinimix 5/15 (no electrolytes) to 50 ml/hr + 20% lipids at 10 ml/hr.  This will provide 1332 KCal and 60 gm protein in 24 hours. Check CMET, Mag, Phos with AM labs Continue CBGs q6h with sensitive SSI for coverage as needed Follow-up pending prealbumin Follow-up fluid balance as she was net positive on 1/31.  MiSharyn Lull  Ariam Mol, Pharm.D., BCPS, AAHIVP Clinical Pharmacist Phone: [phone] or [phone] 03/27/2013, 8:52 AM

## 2013-03-27 NOTE — Progress Notes (Signed)
4 Days Post-Op  Subjective: Dr Nelda Marseille talking with the patient, she was a DNR, but she wants to go on the Vent with acute acidosis.  She is still talking and mentating well.  Dr. Nelda Marseille spoke with daughter and they do not want CPR or long term measures.  They are expecting a goals of care discussion tomorrow.  Currently they are intubating her.  She is not having abdominal pain and not complaining of pain.  Objective: Vital signs in last 24 hours: Temp:  [96.8 F (36 C)-98.6 F (37 C)] 97.9 F (36.6 C) (02/01 0720) Pulse Rate:  [33-114] 99 (02/01 0800) Resp:  [17-35] 23 (02/01 0800) BP: (102-131)/(44-112) 130/53 mmHg (02/01 0800) SpO2:  [24 %-100 %] 100 % (02/01 0800) FiO2 (%):  [50 %-60 %] 60 % (02/01 0400) Last BM Date: 03/27/13 +BM recorded,  TNA/ NPO Afebrile, VSS 7.18/49.8/113/18.7 Creatinine and WBC is still up Intake/Output from previous day: 01/31 0701 - 02/01 0700 In: 1817.2 [I.V.:723.3; IV Piggyback:600; TPN:493.8] Out: 615 [Urine:615] Intake/Output this shift: Total I/O In: 320 [I.V.:40; IV Piggyback:200; TPN:80] Out: 20 [Urine:20]  General appearance: alert, cooperative and talking with Dr. Nelda Marseille and myself. Resp: clear to auscultation bilaterally and anterior ascultation. GI: soft, not tender to palpation, still sore, few BS, wound looks fine.  Lab Results:   Recent Labs  03/26/13 0223 03/27/13 0351  WBC 15.8* 16.0*  HGB 8.8* 8.3*  HCT 27.1* 25.5*  PLT 219 230    BMET  Recent Labs  03/26/13 0223 03/27/13 0351  NA 147 144  K 4.7 4.7  CL 115* 113*  CO2 14* 18*  GLUCOSE 42* 159*  BUN 53* 61*  CREATININE 3.71* 3.75*  CALCIUM 7.7* 8.3*   PT/INR No results found for this basename: LABPROT, INR,  in the last 72 hours   Recent Labs Lab 03/22/13 1248 03/27/13 0351  AST 20 61*  ALT 11 45*  ALKPHOS 98 101  BILITOT 0.6 0.6  PROT 7.8 5.2*  ALBUMIN 3.3* 1.9*     Lipase  No results found for this basename: lipase     Studies/Results: Dg  Chest Port 1 View  03/27/2013   CLINICAL DATA:  Respiratory failure.  EXAM: PORTABLE CHEST - 1 VIEW  COMPARISON:  03/26/2013  FINDINGS: Mild increase in right lung base opacity, which now obscures hemidiaphragm. This is likely due to an increase in pleural fluid. Left lung base opacity is stable consistent with a combination pleural fluid and atelectasis or infiltrate. Right perihilar peribronchial thickening and patchy airspace opacity is similar to the previous day's study.  Right PICC is stable in well positioned.  No pneumothorax.  IMPRESSION: 1. Mild increase in right lung base opacity likely due to increase in pleural fluid. 2. Stable right perihilar and bilateral lung base peribronchial and patchy airspace infiltrates and lung base atelectasis.   Electronically Signed   By: Lajean Manes M.D.   On: 03/27/2013 07:35   Dg Chest Port 1 View  03/26/2013   CLINICAL DATA:  Right arm PICC placement  EXAM: PORTABLE CHEST - 1 VIEW  COMPARISON:  Earlier film of the same day  FINDINGS: There has been interval placement of right arm PICC, tip at the cavoatrial junction. Stable perihilar and bibasilar infiltrates or edema. Possible small effusions. Stable cardiomegaly. .  Visualized skeletal structures are unremarkable.  IMPRESSION: Interval PICC line placement to the cavoatrial junction.   Electronically Signed   By: Arne Cleveland M.D.   On: 03/26/2013 11:16  Dg Chest Port 1 View  03/26/2013   CLINICAL DATA:  Tachypnea, hypertension  EXAM: PORTABLE CHEST - 1 VIEW  COMPARISON:  Portable exam 0846 hr compared to 03/23/2013  FINDINGS: Upper normal heart size.  Decreased lung volumes versus previous exam with bibasilar atelectasis.  Progressive bilateral perihilar and basilar infiltrates.  Small left pleural effusion.  No pneumothorax.  Atherosclerotic calcification aortic arch.  IMPRESSION: Increased perihilar and basilar infiltrates bilaterally with mild bibasilar atelectasis and small left pleural effusion.    Electronically Signed   By: Lavonia Dana M.D.   On: 03/26/2013 09:02   Dg Abd Portable 1v  03/26/2013   CLINICAL DATA:  Status post exploratory laparotomy and small bowel resection on 03/23/2013. Evaluate for ileus.  EXAM: PORTABLE ABDOMEN - 1 VIEW  COMPARISON:  CT, 03/22/2013  FINDINGS: There are dilated loops small bowel in the central abdomen. No colonic distention is seen. This may reflect a diffuse post operative adynamic ileus or a partial small bowel obstruction. The former is favored.  Surgical skin staples are noted along the anterior mid and lower abdominal wall.  IMPRESSION: Dilated small bowel loops consistent with either a post operative adynamic ileus or a partial obstruction.   Electronically Signed   By: Lajean Manes M.D.   On: 03/26/2013 09:09    Medications: . Chlorhexidine Gluconate Cloth  6 each Topical Daily  . ciprofloxacin  400 mg Intravenous Q24H  . enoxaparin (LOVENOX) injection  30 mg Subcutaneous Q24H  . insulin aspart  0-9 Units Subcutaneous Q6H  . metoprolol  5 mg Intravenous Q6H  . metronidazole  500 mg Intravenous Q8H  . mupirocin ointment  1 application Nasal BID  . pantoprazole (PROTONIX) IV  40 mg Intravenous Q12H  . sodium chloride  10-40 mL Intracatheter Q12H    Assessment/Plan Strangulating bowel obstruction, EXPLORATORY LAPAROTOMY, SMALL BOWEL RESECTION.  03/23/2013, Gwenyth Ober, MD Acute Metabolic acidosis/currently undergoing intubation currently Acute renal insuffiencey Post op hypotension/Shock Hypertension  Plan:  Dr. Nelda Marseille is intubating and he will also place an NG.  We will follow closely, she does not have a tender abdomen, so I don't think she has more bowel infarction, but we have to consider that.        LOS: 5 days    Abhimanyu Cruces 03/27/2013

## 2013-03-27 NOTE — Procedures (Signed)
Intubation Procedure Note KAITLINN IVERSEN [id number] 10/10/1925  Procedure: Intubation Indications: Respiratory insufficiency  Procedure Details Consent: Risks of procedure as well as the alternatives and risks of each were explained to the (patient/caregiver).  Consent for procedure obtained. Time Out: Verified patient identification, verified procedure, site/side was marked, verified correct patient position, special equipment/implants available, medications/allergies/relevent history reviewed, required imaging and test results available.  Performed  Maximum sterile technique was used including antiseptics, gloves, hand hygiene and mask.  MAC    Evaluation Hemodynamic Status: BP stable throughout; O2 sats: stable throughout Patient's Current Condition: stable Complications: No apparent complications Patient did tolerate procedure well. Chest X-ray ordered to verify placement.  CXR: pending.   [name] 03/27/2013

## 2013-03-27 NOTE — Progress Notes (Signed)
RT note-VT changed per order for 8cc/kg

## 2013-03-27 NOTE — Progress Notes (Signed)
Name: Kristi Alexander MRN: [id number] DOB: 07-22-1925    ADMISSION DATE:  03/22/2013 CONSULTATION DATE:  1/28  REFERRING MD :  Dr Hulen Skains PRIMARY SERVICE: [facility]  CHIEF COMPLAINT:  Vent and medical management   BRIEF PATIENT DESCRIPTION: 80 yo female with hx HTN admitted 1/27 with SBO.  Initially managed conservatively but cont to have worsening abd pain and increased WBC and was taken to OR 1/28 for exp lap and bowel resection.  Remained on vent post op and PCCM consulted.   SIGNIFICANT EVENTS / STUDIES:  1/27 admitted with SBO, conservative management  1/28 necrotic bowel suspected, to OR, 3-4 feet frankly necrotic bowel removed (no perforation)   LINES / TUBES: ETT 1/28>>>1/29; 2/1>>> L brachial aline 1/28>>>  CULTURES: Urine 1/28>>> negative  ANTIBIOTICS: Flagyl 1/28>>> Cipro 1/29 >>   Subjective/Interval events:  AMS and respiratory distress this AM requiring 100% NRB.    VITAL SIGNS: Temp:  [96.8 F (36 C)-98.6 F (37 C)] 97.9 F (36.6 C) (02/01 0720) Pulse Rate:  [33-114] 99 (02/01 0800) Resp:  [17-35] 23 (02/01 0800) BP: (102-131)/(44-112) 130/53 mmHg (02/01 0800) SpO2:  [24 %-100 %] 100 % (02/01 0800) FiO2 (%):  [50 %-60 %] 60 % (02/01 0400) HEMODYNAMICS:   VENTILATOR SETTINGS: Vent Mode:  [-]  FiO2 (%):  [50 %-60 %] 60 % INTAKE / OUTPUT: Intake/Output     01/31 0701 - 02/01 0700 02/01 0701 - 02/02 0700   P.O.     I.V. (mL/kg) 723.3 (11.3) 40 (0.6)   IV Piggyback 600 200   TPN 493.8 80   Total Intake(mL/kg) 1817.2 (28.4) 320 (5)   Urine (mL/kg/hr) 615 (0.4) 20 (0.2)   Emesis/NG output     Total Output 615 20   Net +1202.2 +300        Stool Occurrence 1 x      PHYSICAL EXAMINATION: General:  Thin, elderly female, acute respiratory distress Neuro:  Awake, follows commands, RASS 0 HEENT:  Mm dry, poor dentition, NGT in place Cardiovascular:  s1s2 rrr Lungs: Labored breathing, decreased BS at the bases  Abdomen:  Distended, tender, dressing c/d   Musculoskeletal:  Warm and dry, no sig edema   LABS:  CBC  Recent Labs Lab 03/25/13 0405 03/26/13 0223 03/27/13 0351  WBC 13.3* 15.8* 16.0*  HGB 8.4* 8.8* 8.3*  HCT 25.2* 27.1* 25.5*  PLT 211 219 230   Coag's  Recent Labs Lab 03/22/13 2100  INR 0.93   BMET  Recent Labs Lab 03/25/13 0405 03/26/13 0223 03/27/13 0351  NA 146 147 144  K 4.7 4.7 4.7  CL 113* 115* 113*  CO2 17* 14* 18*  BUN 51* 53* 61*  CREATININE 4.06* 3.71* 3.75*  GLUCOSE 87 42* 159*   Electrolytes  Recent Labs Lab 03/25/13 0405 03/26/13 0223 03/27/13 0351  CALCIUM 7.1* 7.7* 8.3*  MG  --  1.6 2.0  PHOS  --  7.2* 5.7*   Sepsis Markers  Recent Labs Lab 03/22/13 1406 03/23/13 0312 03/23/13 1430  LATICACIDVEN 7.10* 5.5* 2.1   ABG  Recent Labs Lab 03/23/13 1222 03/24/13 0406 03/27/13 0832  PHART 7.355 7.369 7.180*  PCO2ART 38.7 32.1* 49.8*  PO2ART 256.0* 94.0 113.0*   Liver Enzymes  Recent Labs Lab 03/22/13 1248 03/27/13 0351  AST 20 61*  ALT 11 45*  ALKPHOS 98 101  BILITOT 0.6 0.6  ALBUMIN 3.3* 1.9*   Cardiac Enzymes No results found for this basename: TROPONINI, PROBNP,  in the last 168 hours  Glucose  Recent Labs Lab 03/22/13 2338 03/23/13 0341 03/26/13 0426 03/26/13 1819 03/26/13 2352 03/27/13 0549  GLUCAP 208* 172* 153* 134* 143* 160*    Imaging Dg Chest Port 1 View  03/27/2013   CLINICAL DATA:  Respiratory failure.  EXAM: PORTABLE CHEST - 1 VIEW  COMPARISON:  03/26/2013  FINDINGS: Mild increase in right lung base opacity, which now obscures hemidiaphragm. This is likely due to an increase in pleural fluid. Left lung base opacity is stable consistent with a combination pleural fluid and atelectasis or infiltrate. Right perihilar peribronchial thickening and patchy airspace opacity is similar to the previous day's study.  Right PICC is stable in well positioned.  No pneumothorax.  IMPRESSION: 1. Mild increase in right lung base opacity likely due to increase  in pleural fluid. 2. Stable right perihilar and bilateral lung base peribronchial and patchy airspace infiltrates and lung base atelectasis.   Electronically Signed   By: Lajean Manes M.D.   On: 03/27/2013 07:35   Dg Chest Port 1 View  03/26/2013   CLINICAL DATA:  Right arm PICC placement  EXAM: PORTABLE CHEST - 1 VIEW  COMPARISON:  Earlier film of the same day  FINDINGS: There has been interval placement of right arm PICC, tip at the cavoatrial junction. Stable perihilar and bibasilar infiltrates or edema. Possible small effusions. Stable cardiomegaly. .  Visualized skeletal structures are unremarkable.  IMPRESSION: Interval PICC line placement to the cavoatrial junction.   Electronically Signed   By: Arne Cleveland M.D.   On: 03/26/2013 11:16   Dg Chest Port 1 View  03/26/2013   CLINICAL DATA:  Tachypnea, hypertension  EXAM: PORTABLE CHEST - 1 VIEW  COMPARISON:  Portable exam 0846 hr compared to 03/23/2013  FINDINGS: Upper normal heart size.  Decreased lung volumes versus previous exam with bibasilar atelectasis.  Progressive bilateral perihilar and basilar infiltrates.  Small left pleural effusion.  No pneumothorax.  Atherosclerotic calcification aortic arch.  IMPRESSION: Increased perihilar and basilar infiltrates bilaterally with mild bibasilar atelectasis and small left pleural effusion.   Electronically Signed   By: Lavonia Dana M.D.   On: 03/26/2013 09:02   Dg Abd Portable 1v  03/26/2013   CLINICAL DATA:  Status post exploratory laparotomy and small bowel resection on 03/23/2013. Evaluate for ileus.  EXAM: PORTABLE ABDOMEN - 1 VIEW  COMPARISON:  CT, 03/22/2013  FINDINGS: There are dilated loops small bowel in the central abdomen. No colonic distention is seen. This may reflect a diffuse post operative adynamic ileus or a partial small bowel obstruction. The former is favored.  Surgical skin staples are noted along the anterior mid and lower abdominal wall.  IMPRESSION: Dilated small bowel loops  consistent with either a post operative adynamic ileus or a partial obstruction.   Electronically Signed   By: Lajean Manes M.D.   On: 03/26/2013 09:09   ASSESSMENT / PLAN:  PULMONARY Acute Respiratory Failure due to bowel ischemia and severe sepsis.  ABG with mixed acidosis and hypoxemia.  Spoke with daughter, patient evidently had been DNR in the past.  Requested that we intubate but no CPR and no cardioversion.  She requested to speak with surgery and CC in AM regarding plan of care as she does not wish for trach/peg. P:   - Intubate. - Full vent support. - F/U ABG and CXR. - Titrate O2 for sats. - Family meeting in AM.  CARDIOVASCULAR Hx HTN - currently borderline hypotension  Severe Sepsis without shock, lactate cleared over 24  hours P:  - Continue beta blockers for HTN and tachycardia, likely withdrawal tachy, but at a lower dose. - KVO IVF now that is on TPN.  RENAL Acute renal failure, ATN, now non-oliguric 1/30 Lactic acidosis, resolved Hyperchloremic metabolic acidosis P:   - Renal function plateaued. - Continue TPN. - Renal function and UOP improving, will monitor for now. - Recommend talking with family if dialysis is desired prior to calling renal, in AM family meeting.  GASTROINTESTINAL SBO -- s/p exp lap 1/28 with 4 ft necrotic bowel  P:   - PPI ordered. - Continue TPN due to ileus. - Replaced OGT today and to suction, surgery to address abdomen.  HEMATOLOGIC No active issue  P:  - F/u cbc.  INFECTIOUS Necrotic bowel - without perforation  P:   - Empiric abx - cipro/ flagyl as ordered.  ENDOCRINE No active issue    P:   - Trend glucose on bmet.  NEUROLOGIC Post op pain  P:   - PRN fentanyl.  I have personally obtained a history, examined the patient, evaluated laboratory and imaging results, formulated the assessment and plan and placed orders.  CRITICAL CARE: The patient is critically ill with multiple organ systems failure and requires  high complexity decision making for assessment and support, frequent evaluation and titration of therapies, application of advanced monitoring technologies and extensive interpretation of multiple databases. Critical Care Time devoted to patient care services described in this note is 35 minutes.   *Care during the described time interval was provided by me and/or other providers on the critical care team. I have reviewed this patient's available data, including medical history, events of note, physical examination and test results as part of my evaluation.  Rush Farmer, M.D. [facility] Pulmonary/Critical Care Medicine. Pager: [phone]. After hours pager: [phone].

## 2013-03-27 NOTE — Progress Notes (Signed)
CT reviewed.  Expected post op changes.  No explanation for acidosis  At this point. Continue supportive care.

## 2013-03-27 NOTE — Procedures (Signed)
OGT Placement  Done under glidescope guidance by me.  Rush Farmer, M.D. [facility] Pulmonary/Critical Care Medicine. Pager: [phone]. After hours pager: [phone].

## 2013-03-27 NOTE — Progress Notes (Signed)
Pt seen and examined. Appears respiratory acidosis with lactate of 1.1. Abdomen is soft without peritonitis or rigidity.  Intubated now.  Check CT oral contrast only to evaluate intraabdominal status.   Appears stable for now.

## 2013-03-28 ENCOUNTER — Inpatient Hospital Stay (HOSPITAL_COMMUNITY): Payer: Medicare Other

## 2013-03-28 DIAGNOSIS — E46 Unspecified protein-calorie malnutrition: Secondary | ICD-10-CM

## 2013-03-28 LAB — COMPREHENSIVE METABOLIC PANEL
ALT: 32 U/L (ref 0–35)
AST: 38 U/L — AB (ref 0–37)
Albumin: 1.7 g/dL — ABNORMAL LOW (ref 3.5–5.2)
Alkaline Phosphatase: 80 U/L (ref 39–117)
BUN: 67 mg/dL — ABNORMAL HIGH (ref 6–23)
CALCIUM: 8 mg/dL — AB (ref 8.4–10.5)
CO2: 17 meq/L — AB (ref 19–32)
Chloride: 111 mEq/L (ref 96–112)
Creatinine, Ser: 3.58 mg/dL — ABNORMAL HIGH (ref 0.50–1.10)
GFR calc Af Amer: 12 mL/min — ABNORMAL LOW (ref >90)
GFR, EST NON AFRICAN AMERICAN: 11 mL/min — AB (ref >90)
Glucose, Bld: 178 mg/dL — ABNORMAL HIGH (ref 70–99)
POTASSIUM: 3.7 meq/L (ref 3.7–5.3)
Sodium: 142 mEq/L (ref 137–147)
Total Bilirubin: 0.6 mg/dL (ref 0.3–1.2)
Total Protein: 4.9 g/dL — ABNORMAL LOW (ref 6.0–8.3)

## 2013-03-28 LAB — BLOOD GAS, ARTERIAL
ACID-BASE DEFICIT: 7.7 mmol/L — AB (ref 0.0–2.0)
Bicarbonate: 17.3 mEq/L — ABNORMAL LOW (ref 20.0–24.0)
Drawn by: 34779
FIO2: 0.5 %
O2 SAT: 96.1 %
PATIENT TEMPERATURE: 99.2
PCO2 ART: 35.5 mmHg (ref 35.0–45.0)
PEEP: 5 cmH2O
RATE: 24 resp/min
TCO2: 18.4 mmol/L (ref 0–100)
VT: 400 mL
pH, Arterial: 7.311 — ABNORMAL LOW (ref 7.350–7.450)
pO2, Arterial: 91.1 mmHg (ref 80.0–100.0)

## 2013-03-28 LAB — CBC
HEMATOCRIT: 23 % — AB (ref 36.0–46.0)
HEMOGLOBIN: 7.7 g/dL — AB (ref 12.0–15.0)
MCH: 25.1 pg — ABNORMAL LOW (ref 26.0–34.0)
MCHC: 33.5 g/dL (ref 30.0–36.0)
MCV: 74.9 fL — AB (ref 78.0–100.0)
Platelets: 180 10*3/uL (ref 150–400)
RBC: 3.07 MIL/uL — ABNORMAL LOW (ref 3.87–5.11)
RDW: 20 % — ABNORMAL HIGH (ref 11.5–15.5)
WBC: 13.5 10*3/uL — AB (ref 4.0–10.5)

## 2013-03-28 LAB — GLUCOSE, CAPILLARY
GLUCOSE-CAPILLARY: 153 mg/dL — AB (ref 70–99)
GLUCOSE-CAPILLARY: 170 mg/dL — AB (ref 70–99)
GLUCOSE-CAPILLARY: 179 mg/dL — AB (ref 70–99)
Glucose-Capillary: 181 mg/dL — ABNORMAL HIGH (ref 70–99)
Glucose-Capillary: 177 mg/dL — ABNORMAL HIGH (ref 70–99)
Glucose-Capillary: 194 mg/dL — ABNORMAL HIGH (ref 70–99)
Glucose-Capillary: 226 mg/dL — ABNORMAL HIGH (ref 70–99)

## 2013-03-28 LAB — PROTIME-INR
INR: 1.27 (ref 0.00–1.49)
Prothrombin Time: 15.6 seconds — ABNORMAL HIGH (ref 11.6–15.2)

## 2013-03-28 LAB — MAGNESIUM: Magnesium: 1.7 mg/dL (ref 1.5–2.5)

## 2013-03-28 LAB — PHOSPHORUS: Phosphorus: 3.4 mg/dL (ref 2.3–4.6)

## 2013-03-28 LAB — APTT: APTT: 33 s (ref 24–37)

## 2013-03-28 LAB — LACTIC ACID, PLASMA: LACTIC ACID, VENOUS: 1.3 mmol/L (ref 0.5–2.2)

## 2013-03-28 MED ORDER — SODIUM BICARBONATE 8.4 % IV SOLN
INTRAVENOUS | Status: DC
  Administered 2013-03-28 – 2013-03-30 (×3): via INTRAVENOUS
  Filled 2013-03-28 (×5): qty 150

## 2013-03-28 MED ORDER — FUROSEMIDE 10 MG/ML IJ SOLN
40.0000 mg | Freq: Three times a day (TID) | INTRAMUSCULAR | Status: AC
  Administered 2013-03-28 (×2): 40 mg via INTRAVENOUS
  Filled 2013-03-28 (×2): qty 4

## 2013-03-28 MED ORDER — TRACE MINERALS CR-CU-F-FE-I-MN-MO-SE-ZN IV SOLN
INTRAVENOUS | Status: AC
  Administered 2013-03-28: 18:00:00 via INTRAVENOUS
  Filled 2013-03-28: qty 2000

## 2013-03-28 MED ORDER — FAT EMULSION 20 % IV EMUL
250.0000 mL | INTRAVENOUS | Status: AC
  Administered 2013-03-28: 250 mL via INTRAVENOUS
  Filled 2013-03-28: qty 250

## 2013-03-28 MED ORDER — ALTEPLASE 100 MG IV SOLR
2.0000 mg | Freq: Once | INTRAVENOUS | Status: AC
  Administered 2013-03-28: 2 mg
  Filled 2013-03-28: qty 2

## 2013-03-28 NOTE — Progress Notes (Addendum)
PARENTERAL NUTRITION CONSULT NOTE - FOLLOW UP  Pharmacy Consult for TPN Indication: prolonged ileus  Allergies  Allergen Reactions  . Penicillins Swelling    Patient Measurements: Height: 5\' 2"  (157.5 cm) Weight: 141 lb 1.5 oz (64 kg) IBW/kg (Calculated) : 50.1  Vital Signs: Temp: 100.1 F (37.8 C) (02/02 0800) Temp src: Oral (02/02 0800) BP: 104/47 mmHg (02/02 0800) Pulse Rate: 106 (02/02 0800) Intake/Output from previous day: 02/01 0701 - 02/02 0700 In: 3110 [I.V.:460; NG/GT:990; IV Piggyback:500; TPN:1160] Out: 890 [Urine:490; Emesis/NG output:400] Intake/Output from this shift: Total I/O In: 280 [I.V.:20; IV Piggyback:200; TPN:60] Out: 300 [Urine:100; Emesis/NG output:200]  Labs:  Recent Labs  03/26/13 0223 03/27/13 0351 03/28/13 0330  WBC 15.8* 16.0* 13.5*  HGB 8.8* 8.3* 7.7*  HCT 27.1* 25.5* 23.0*  PLT 219 230 180  APTT  --   --  33  INR  --   --  1.27     Recent Labs  03/26/13 0223 03/27/13 0351 03/28/13 0330  NA 147 144 142  K 4.7 4.7 3.7  CL 115* 113* 111  CO2 14* 18* 17*  GLUCOSE 42* 159* 178*  BUN 53* 61* 67*  CREATININE 3.71* 3.75* 3.58*  CALCIUM 7.7* 8.3* 8.0*  MG 1.6 2.0 1.7  PHOS 7.2* 5.7* 3.4  PROT  --  5.2* 4.9*  ALBUMIN  --  1.9* 1.7*  AST  --  61* 38*  ALT  --  45* 32  ALKPHOS  --  101 80  BILITOT  --  0.6 0.6  PREALBUMIN  --  6.2*  --   TRIG  --  99  --    Estimated Creatinine Clearance: 10.1 ml/min (by C-G formula based on Cr of 3.58).    Recent Labs  03/26/13 2352 03/27/13 0549 03/27/13 1132  GLUCAP 143* 160* 105*    Medications:  Infusions:  . sodium chloride 20 mL/hr at 03/28/13 0800  . TPN (CLINIMIX) Adult without lytes 50 mL/hr at 03/28/13 0800   And  . fat emulsion 250 mL (03/28/13 0800)    Insulin Requirements in the past 24 hours:  6 units SSI  Current Nutrition:  TPN at 50 ml/rh plus lipids at 10 ml/hr  Assessment: 80 year old female admitted with SBO s/p ex lap and bowel resection on 1/28.   She is to on TPN for prolonged ileus. Intubated 2/1 for acute metabolic acidosis.  CT 2/1: expected post po changes.   Nutrition: baseline prealbumin 6.2, reflects post-op status and inflammatory state GI: SBO s/p ex-lap with bowel resection 1/28.  NPO. Endo: no history of DM. Got 6 units of SSI in past 24 hours Lytes:  K down to 3.7 from 4.7, Mag 1.7 Phos 3.4, lytes trending down Renal: SCr 3.58, decreasing trend; I/O +2140, UOP 0.64ml/kg/hr Pulm: intubated 2/1 Hepatobil: AST/ALT ok, Tbili ok TPN Access: PICC placed 1/31 TPN day#: 3  Nutritional Goals: awaiting RD assessment of goals ~1600 kCal, ~60 grams of protein per day  Plan:  Change Clinimix 5/15 (no electrolytes) to Clinimix E 5/15 (with electrolytes) at 50 ml/hr + 20% lipids at 10 ml/hr.  This provides 60 gm protein and 1332 kcals. I am not replacing magnesium because I am add standard electrolytes to tonight's TPN Check BMET, Mag, Phos with AM labs Continue SSI, no insulin in TPN yet. F/u with unit RD for goals Eudelia Bunch, Pharm.D. [phone] 03/28/2013 9:42 AM

## 2013-03-28 NOTE — Progress Notes (Signed)
Agree with above, cont vent, cont ng tube, likely more ileus than actual obstruction right now. Appreciate ccm assistance

## 2013-03-28 NOTE — Progress Notes (Signed)
INITIAL NUTRITION ASSESSMENT  DOCUMENTATION CODES Per approved criteria  -Not Applicable   INTERVENTION: 1.  Parenteral nutrition; ongoing management per PharmD.  Needs estimate placed below.   NUTRITION DIAGNOSIS: Inadequate oral intake related to inability to eat as evidenced by ileus.  Monitor:  1.  Parenteral nutrition; initiation with tolerance.  Management per PharmD.  Pt to meet 90% of estimated needs as able with pre-mixed Clinimix.  2.  Wt/wt change; monitor trends  Reason for Assessment: consult  80 y.o. female  Admitting Dx: small bowel obstruction  ASSESSMENT: Pt admitted with small bowel obstruction.  Pt initially managed conservatively, however developed worsening abdomina lpain and is now s/p ex lap (1/28) with bowel resection. Pt remained on vent post-op and has not been able to successfully wean from vent.  Patient is currently intubated on ventilator support.  MV: 13.2 L/min Temp (24hrs), Avg:98.6 F (37 C), Min:96.8 F (36 C), Max:100.1 F (37.8 C)  Propofol: none  MD has ordered TPN for pt. Clinimix 5/15 currently infusing at 50 mL/hr with 20% IV lipids at 10 mL/hr providing 1332 kcal, 60g protein.    Height: Ht Readings from Last 1 Encounters:  03/23/13 5\' 2"  (1.575 m)    Weight: Wt Readings from Last 1 Encounters:  03/23/13 141 lb 1.5 oz (64 kg)    Ideal Body Weight: 110 lbs  % Ideal Body Weight: 128%  Wt Readings from Last 10 Encounters:  03/23/13 141 lb 1.5 oz (64 kg)  03/23/13 141 lb 1.5 oz (64 kg)  01/19/13 144 lb (65.318 kg)  07/21/12 145 lb (65.772 kg)  06/17/11 143 lb (64.864 kg)    Usual Body Weight: unknown  % Usual Body Weight: unable to assess  BMI:  Body mass index is 25.8 kg/(m^2).  Estimated Nutritional Needs: Kcal: 1307 Protein: 76-89g Fluid: >1.5 L/day  Skin: incision  Diet Order: NPO  EDUCATION NEEDS: -Education not appropriate at this time   Intake/Output Summary (Last 24 hours) at 03/28/13 1400 Last  data filed at 03/28/13 1200  Gross per 24 hour  Intake   2050 ml  Output   1440 ml  Net    610 ml    Last BM: 2/2  Labs:   Recent Labs Lab 03/26/13 0223 03/27/13 0351 03/28/13 0330  NA 147 144 142  K 4.7 4.7 3.7  CL 115* 113* 111  CO2 14* 18* 17*  BUN 53* 61* 67*  CREATININE 3.71* 3.75* 3.58*  CALCIUM 7.7* 8.3* 8.0*  MG 1.6 2.0 1.7  PHOS 7.2* 5.7* 3.4  GLUCOSE 42* 159* 178*    CBG (last 3)   Recent Labs  03/28/13 0720 03/28/13 0751 03/28/13 1138  GLUCAP 181* 177* 153*    Scheduled Meds: . antiseptic oral rinse  15 mL Mouth Rinse QID  . chlorhexidine  15 mL Mouth Rinse BID  . ciprofloxacin  400 mg Intravenous Q24H  . enoxaparin (LOVENOX) injection  30 mg Subcutaneous Q24H  . furosemide  40 mg Intravenous Q8H  . insulin aspart  0-9 Units Subcutaneous Q6H  . metoprolol  5 mg Intravenous Q6H  . metronidazole  500 mg Intravenous Q8H  . pantoprazole (PROTONIX) IV  40 mg Intravenous Q12H  . sodium chloride  10-40 mL Intracatheter Q12H    Continuous Infusions: . Marland KitchenTPN (CLINIMIX-E) Adult     And  . fat emulsion    . sodium chloride 20 mL/hr at 03/28/13 1200  . TPN (CLINIMIX) Adult without lytes 50 mL/hr at 03/28/13 1200  And  . fat emulsion 250 mL (03/28/13 1200)  .  sodium bicarbonate  infusion 1000 mL 50 mL/hr at 03/28/13 1033    Past Medical History  Diagnosis Date  . Restless leg syndrome   . Hypertension   . Dyslipidemia   . History of appendectomy   . H/O: hysterectomy   . Cyst of breast, right, benign solitary   . Inguinal hernia   . History of lumbosacral spine surgery     Past Surgical History  Procedure Laterality Date  . Appendectomy    . Abdominal hysterectomy    . Laparotomy N/A 03/23/2013    Procedure: EXPLORATORY LAPAROTOMY;  Surgeon: Gwenyth Ober, MD;  Location: [facility];  Service: General;  Laterality: N/A;  . Bowel resection N/A 03/23/2013    Procedure: SMALL BOWEL RESECTION;  Surgeon: Gwenyth Ober, MD;  Location: [facility];   Service: General;  Laterality: N/A;    Brynda Greathouse, MS RD LDN Clinical Inpatient Dietitian Pager: [phone] Weekend/After hours pager: [phone]

## 2013-03-28 NOTE — Progress Notes (Signed)
picc line clotted  Plan Change picc line  Dr. Brand Males, M.D., [facility].C.P Pulmonary and Critical Care Medicine Staff Physician [facility] Pager: [phone], If no answer or between  15:00h - 7:00h: call [phone]  03/28/2013 4:51 PM

## 2013-03-28 NOTE — Progress Notes (Signed)
PT Cancellation Note  Patient Details Name: Kristi Alexander MRN: [id number] DOB: 1925-07-15   Cancelled Treatment:    Reason Eval/Treat Not Completed: Medical issues which prohibited therapy. Pt re-intubated and sedated. Will follow for appropriateness to resume PT services/activity.   [name] 03/28/2013, 12:58 PM Pager [phone]

## 2013-03-28 NOTE — Progress Notes (Signed)
Name: Kristi Alexander MRN: [id number] DOB: 1926/01/07    ADMISSION DATE:  03/22/2013 CONSULTATION DATE:  1/28  REFERRING MD :  Dr Hulen Skains PRIMARY SERVICE: [facility]  CHIEF COMPLAINT:  Vent and medical management   BRIEF PATIENT DESCRIPTION: 80 yo female with hx HTN admitted 1/27 with SBO.  Initially managed conservatively but cont to have worsening abd pain and increased WBC and was taken to OR 1/28 for exp lap and bowel resection.  Remained on vent post op and PCCM consulted.   SIGNIFICANT EVENTS / STUDIES:  1/27 admitted with SBO, conservative management  1/28 necrotic bowel suspected, to OR, 3-4 feet frankly necrotic bowel removed (no perforation)  2/01 Re-intubated; CT abd >> possible adhesions causing obstruction of SB, colon thickening ?infection or ischemia  LINES / TUBES: ETT 1/28>>>1/29; 2/1>>> L brachial aline 1/28>>> Rt PICC 2/1 >>  CULTURES: Urine 1/28>>> negative  ANTIBIOTICS: Flagyl 1/28>>> Cipro 1/29 >>   SUBJECTIVE:  Did not tolerate pressure support.  VITAL SIGNS: Temp:  [96.8 F (36 C)-100.1 F (37.8 C)] 100.1 F (37.8 C) (02/02 0800) Pulse Rate:  [95-112] 106 (02/02 0800) Resp:  [14-33] 33 (02/02 0800) BP: (104-140)/(46-68) 104/47 mmHg (02/02 0800) SpO2:  [97 %-100 %] 100 % (02/02 0800) FiO2 (%):  [50 %-100 %] 50 % (02/02 0839) VENTILATOR SETTINGS: Vent Mode:  [-] PRVC FiO2 (%):  [50 %-100 %] 50 % Set Rate:  [24 bmp] 24 bmp Vt Set:  [400 mL] 400 mL PEEP:  [5 cmH20] 5 cmH20 Plateau Pressure:  [13 cmH20-20 cmH20] 19 cmH20 INTAKE / OUTPUT: Intake/Output     02/01 0701 - 02/02 0700 02/02 0701 - 02/03 0700   I.V. (mL/kg) 460 (7.2) 20 (0.3)   NG/GT 990    IV Piggyback 500 200   TPN 1160 60   Total Intake(mL/kg) 3110 (48.6) 280 (4.4)   Urine (mL/kg/hr) 490 (0.3) 100 (0.6)   Emesis/NG output 400 (0.3) 200 (1.3)   Total Output 890 300   Net +2220 -20        Stool Occurrence  1 x     PHYSICAL EXAMINATION: General:  No distress Neuro:  Awake,  follows commands, RASS 0 HEENT: ETT in place Cardiovascular:  s1s2 rrr Lungs: b/l rhonchi, basilar rales Abdomen:  Wound dressing clean Musculoskeletal:  1+ edema  LABS:  CBC  Recent Labs Lab 03/26/13 0223 03/27/13 0351 03/28/13 0330  WBC 15.8* 16.0* 13.5*  HGB 8.8* 8.3* 7.7*  HCT 27.1* 25.5* 23.0*  PLT 219 230 180   Coag's  Recent Labs Lab 03/22/13 2100 03/28/13 0330  APTT  --  33  INR 0.93 1.27   BMET  Recent Labs Lab 03/26/13 0223 03/27/13 0351 03/28/13 0330  NA 147 144 142  K 4.7 4.7 3.7  CL 115* 113* 111  CO2 14* 18* 17*  BUN 53* 61* 67*  CREATININE 3.71* 3.75* 3.58*  GLUCOSE 42* 159* 178*   Electrolytes  Recent Labs Lab 03/26/13 0223 03/27/13 0351 03/28/13 0330  CALCIUM 7.7* 8.3* 8.0*  MG 1.6 2.0 1.7  PHOS 7.2* 5.7* 3.4   Sepsis Markers  Recent Labs Lab 03/23/13 1430 03/27/13 0939 03/28/13 0355  LATICACIDVEN 2.1 1.1 1.3   ABG  Recent Labs Lab 03/27/13 1015 03/27/13 1220 03/28/13 0420  PHART 7.226* 7.329* 7.311*  PCO2ART 43.1 30.3* 35.5  PO2ART 97.0 63.0* 91.1   Liver Enzymes  Recent Labs Lab 03/22/13 1248 03/27/13 0351 03/28/13 0330  AST 20 61* 38*  ALT 11 45* 32  ALKPHOS 98 101 80  BILITOT 0.6 0.6 0.6  ALBUMIN 3.3* 1.9* 1.7*   Glucose  Recent Labs Lab 03/23/13 0341 03/26/13 0426 03/26/13 1819 03/26/13 2352 03/27/13 0549 03/27/13 1132  GLUCAP 172* 153* 134* 143* 160* 105*    Imaging Ct Abdomen Pelvis Wo Contrast  03/27/2013   CLINICAL DATA:  Metabolic acidosis.  Recent small bowel infarction.  EXAM: CT ABDOMEN AND PELVIS WITHOUT CONTRAST  TECHNIQUE: Multidetector CT imaging of the abdomen and pelvis was performed following the standard protocol without intravenous contrast.  COMPARISON:  DG ABD PORTABLE 1V dated 03/26/2013; CT ABD/PELVIS W CM dated 03/22/2013  FINDINGS: Small to moderate bilateral pleural effusions noted with passive atelectasis in the lower lobes. Increased interstitial accentuation in the  right middle lobe and lingula with underlying cardiomegaly. Low-density blood pool favors anemia. Somewhat flat contour of the interventricular septum could be a secondary manifestation of right heart strain.  Vicarious contrast excretion into the gallbladder noted; the contrast reveals a 1.9 cm gallstone on image 42 of series 2 within the gallbladder.  There is edema around the duodenum. Lax ligament of Treitz noted. Thick walled proximal jejunum leads to dilated jejunum is observed measuring up to 4.5 cm with internal air-fluid levels. The dilated bowel extends to the anastomosis and just beyond the anastomosis where there is a transition to nondilated bowel.  I do not observe pneumatosis or obvious extraluminal gas. Pelvic ascites is present, although reduced in volume from 03/22/2013.  There is ascending colon diverticulosis. Wall thickening at the hepatic flexure was not present previously and most likely represents peristalsis. There is abnormal wall thickening in the transverse colon along with scattered colonic diverticula in the transverse colon and descending colon. Descending colon is small in caliber. Sigmoid colon diverticulosis is present.  A Foley catheter is present in the urinary bladder.  Subcutaneous edema noted along the flanks and in the pelvis, and extending into the mons pubis region.  A small left inguinal hernia contains adipose tissue. There is lumbar spondylosis and degenerative disc disease particularly at L1-2, L2-3, and L3-4.  IMPRESSION: 1. Transition in caliber of the small bowel from dilated jejunal segments with internal air-fluid levels to a nondilated segment just pass the anastomotic line in the small bowel. Appearance concerning for the adhesion causing obstruction. 2. Abnormal thick walled segments of bowel, including the proximal jejunum, transverse colon, and a small segment of the hepatic flexure. These are nonspecific with regard to infectious colitis/enteritis,  inflammatory bowel disease, or ischemic bowel inflammation, but no extraluminal gas or pneumatosis is observed. 3. Reduced ascites compared to the prior exam. 4. Diffuse colonic diverticulosis. 5. Moderate bilateral pleural effusions with atelectasis in the lung bases. 6. Cardiomegaly. Possible flattening of the interventricular septum which may indicate right heart strain. 7. Low-density blood pool suggests anemia. 8. Subcutaneous edema along the flanks and pubis. 9. Small left inguinal hernia contains adipose tissue. 10. Lumbar spondylosis and degenerative disc disease. 11. Chololithiasis.   Electronically Signed   By: Sherryl Barters M.D.   On: 03/27/2013 16:58   Dg Chest Port 1 View  03/28/2013   CLINICAL DATA:  ETT placement.  EXAM: PORTABLE CHEST - 1 VIEW  COMPARISON:  03/27/2013  FINDINGS: New endotracheal tube, tip at the level of the mid to lower thoracic trachea. New gastric suction tube, which enters the stomach. Right upper extremity PICC in stable position.  Diffuse airspace disease, particularly dense at the bases. There are layering pleural effusions, confirmed on CT abdomen from  yesterday. No evidence of pneumothorax. Stable heart size. No evidence of pneumothorax.  IMPRESSION: 1. New endotracheal and orogastric tubes are in good position. 2. Unchanged bilateral airspace disease and small effusions.   Electronically Signed   By: Jorje Guild M.D.   On: 03/28/2013 07:15   Dg Chest Port 1 View  03/27/2013   CLINICAL DATA:  Respiratory failure.  EXAM: PORTABLE CHEST - 1 VIEW  COMPARISON:  03/26/2013  FINDINGS: Mild increase in right lung base opacity, which now obscures hemidiaphragm. This is likely due to an increase in pleural fluid. Left lung base opacity is stable consistent with a combination pleural fluid and atelectasis or infiltrate. Right perihilar peribronchial thickening and patchy airspace opacity is similar to the previous day's study.  Right PICC is stable in well positioned.  No  pneumothorax.  IMPRESSION: 1. Mild increase in right lung base opacity likely due to increase in pleural fluid. 2. Stable right perihilar and bilateral lung base peribronchial and patchy airspace infiltrates and lung base atelectasis.   Electronically Signed   By: Lajean Manes M.D.   On: 03/27/2013 07:35   Dg Chest Port 1 View  03/26/2013   CLINICAL DATA:  Right arm PICC placement  EXAM: PORTABLE CHEST - 1 VIEW  COMPARISON:  Earlier film of the same day  FINDINGS: There has been interval placement of right arm PICC, tip at the cavoatrial junction. Stable perihilar and bibasilar infiltrates or edema. Possible small effusions. Stable cardiomegaly. .  Visualized skeletal structures are unremarkable.  IMPRESSION: Interval PICC line placement to the cavoatrial junction.   Electronically Signed   By: Arne Cleveland M.D.   On: 03/26/2013 11:16   ASSESSMENT / PLAN:  PULMONARY A: Acute respiratory failure in setting of ischemic bowel and sepsis.  Re-intubated 2/1. Pleural effusions. P:   -full vent support for now >> need to d/w family about goals of care -f/u CXR  CARDIOVASCULAR A: Severe sepsis >> resolved. Hx of HTN. P:  -continue scheduled IV lopressor  RENAL A: Acute kidney injury (baseline creatinine 0.9 from 03/22/13). Non-anion gap metabolic acidosis. Lactic acidosis >> resolved. Hypervolemia. P:   -monitor renal fx urine outpt. Electrolytes -add HCO3 to IV fluid 2/02 -give lasix 40 mg IV q8h x 2 on 2/02  GASTROINTESTINAL A: SBO from ischemic bowel >> CT abd findings from 2/01 noted. P:   -post-op care, nutrition per CCS -protonix for SUP  HEMATOLOGIC A: Anemia of critical illness. P:  -f/u CBC -lovenox for DVT prevention  INFECTIOUS A: Severe sepsis 2nd to ischemic bowel. P:   -continue cipro/flagyl per [facility]  ENDOCRINE A: Hyperglycemia. P:   -SSI  NEUROLOGIC A: Post op pain  P:   -PRN fentanyl.  Summary: Concerned that she will require very long rehab  course and is at risk for multiple complications during this process.  She was previously DNR status.  Depending on family wishes will need to determine goals of care regarding possible tracheostomy to assist with vent weaning.  CC time 35 minutes.  Chesley Mires, MD [facility] Pulmonary/Critical Care 03/28/2013, 9:43 AM Pager:  [phone] After 3pm call: [phone]

## 2013-03-28 NOTE — Progress Notes (Signed)
Patient ID: Kristi Alexander, female   DOB: 07/02/1925, 80 y.o.   MRN: [id number] 5 Days Post-Op  Subjective: Pt has some abdominal pain.  Otherwise on vent  Objective: Vital signs in last 24 hours: Temp:  [96.8 F (36 C)-100.1 F (37.8 C)] 100.1 F (37.8 C) (02/02 0800) Pulse Rate:  [95-112] 106 (02/02 0800) Resp:  [14-33] 33 (02/02 0800) BP: (104-140)/(46-68) 104/47 mmHg (02/02 0800) SpO2:  [97 %-100 %] 100 % (02/02 0800) FiO2 (%):  [50 %-100 %] 50 % (02/02 0839) Last BM Date: 03/28/13  Intake/Output from previous day: 02/01 0701 - 02/02 0700 In: 3110 [I.V.:460; NG/GT:990; IV Piggyback:500; TPN:1160] Out: 890 [Urine:490; Emesis/NG output:400] Intake/Output this shift: Total I/O In: 280 [I.V.:20; IV Piggyback:200; TPN:60] Out: 300 [Urine:100; Emesis/NG output:200]  PE: Abd: soft, appropriately tender, hypoactive to absent BS this am, incision c/d/i with staples present, NGT in place Heart: tachy Lungs: CTAB anteriorly  Lab Results:   Recent Labs  03/27/13 0351 03/28/13 0330  WBC 16.0* 13.5*  HGB 8.3* 7.7*  HCT 25.5* 23.0*  PLT 230 180   BMET  Recent Labs  03/27/13 0351 03/28/13 0330  NA 144 142  K 4.7 3.7  CL 113* 111  CO2 18* 17*  GLUCOSE 159* 178*  BUN 61* 67*  CREATININE 3.75* 3.58*  CALCIUM 8.3* 8.0*   PT/INR  Recent Labs  03/28/13 0330  LABPROT 15.6*  INR 1.27   CMP     Component Value Date/Time   NA 142 03/28/2013 0330   K 3.7 03/28/2013 0330   CL 111 03/28/2013 0330   CO2 17* 03/28/2013 0330   GLUCOSE 178* 03/28/2013 0330   BUN 67* 03/28/2013 0330   CREATININE 3.58* 03/28/2013 0330   CALCIUM 8.0* 03/28/2013 0330   PROT 4.9* 03/28/2013 0330   ALBUMIN 1.7* 03/28/2013 0330   AST 38* 03/28/2013 0330   ALT 32 03/28/2013 0330   ALKPHOS 80 03/28/2013 0330   BILITOT 0.6 03/28/2013 0330   GFRNONAA 11* 03/28/2013 0330   GFRAA 12* 03/28/2013 0330   Lipase  No results found for this basename: lipase       Studies/Results: Ct Abdomen Pelvis Wo  Contrast  03/27/2013   CLINICAL DATA:  Metabolic acidosis.  Recent small bowel infarction.  EXAM: CT ABDOMEN AND PELVIS WITHOUT CONTRAST  TECHNIQUE: Multidetector CT imaging of the abdomen and pelvis was performed following the standard protocol without intravenous contrast.  COMPARISON:  DG ABD PORTABLE 1V dated 03/26/2013; CT ABD/PELVIS W CM dated 03/22/2013  FINDINGS: Small to moderate bilateral pleural effusions noted with passive atelectasis in the lower lobes. Increased interstitial accentuation in the right middle lobe and lingula with underlying cardiomegaly. Low-density blood pool favors anemia. Somewhat flat contour of the interventricular septum could be a secondary manifestation of right heart strain.  Vicarious contrast excretion into the gallbladder noted; the contrast reveals a 1.9 cm gallstone on image 42 of series 2 within the gallbladder.  There is edema around the duodenum. Lax ligament of Treitz noted. Thick walled proximal jejunum leads to dilated jejunum is observed measuring up to 4.5 cm with internal air-fluid levels. The dilated bowel extends to the anastomosis and just beyond the anastomosis where there is a transition to nondilated bowel.  I do not observe pneumatosis or obvious extraluminal gas. Pelvic ascites is present, although reduced in volume from 03/22/2013.  There is ascending colon diverticulosis. Wall thickening at the hepatic flexure was not present previously and most likely represents peristalsis. There is abnormal wall  thickening in the transverse colon along with scattered colonic diverticula in the transverse colon and descending colon. Descending colon is small in caliber. Sigmoid colon diverticulosis is present.  A Foley catheter is present in the urinary bladder.  Subcutaneous edema noted along the flanks and in the pelvis, and extending into the mons pubis region.  A small left inguinal hernia contains adipose tissue. There is lumbar spondylosis and degenerative disc  disease particularly at L1-2, L2-3, and L3-4.  IMPRESSION: 1. Transition in caliber of the small bowel from dilated jejunal segments with internal air-fluid levels to a nondilated segment just pass the anastomotic line in the small bowel. Appearance concerning for the adhesion causing obstruction. 2. Abnormal thick walled segments of bowel, including the proximal jejunum, transverse colon, and a small segment of the hepatic flexure. These are nonspecific with regard to infectious colitis/enteritis, inflammatory bowel disease, or ischemic bowel inflammation, but no extraluminal gas or pneumatosis is observed. 3. Reduced ascites compared to the prior exam. 4. Diffuse colonic diverticulosis. 5. Moderate bilateral pleural effusions with atelectasis in the lung bases. 6. Cardiomegaly. Possible flattening of the interventricular septum which may indicate right heart strain. 7. Low-density blood pool suggests anemia. 8. Subcutaneous edema along the flanks and pubis. 9. Small left inguinal hernia contains adipose tissue. 10. Lumbar spondylosis and degenerative disc disease. 11. Chololithiasis.   Electronically Signed   By: Sherryl Barters M.D.   On: 03/27/2013 16:58   Dg Chest Port 1 View  03/28/2013   CLINICAL DATA:  ETT placement.  EXAM: PORTABLE CHEST - 1 VIEW  COMPARISON:  03/27/2013  FINDINGS: New endotracheal tube, tip at the level of the mid to lower thoracic trachea. New gastric suction tube, which enters the stomach. Right upper extremity PICC in stable position.  Diffuse airspace disease, particularly dense at the bases. There are layering pleural effusions, confirmed on CT abdomen from yesterday. No evidence of pneumothorax. Stable heart size. No evidence of pneumothorax.  IMPRESSION: 1. New endotracheal and orogastric tubes are in good position. 2. Unchanged bilateral airspace disease and small effusions.   Electronically Signed   By: Jorje Guild M.D.   On: 03/28/2013 07:15   Dg Chest Port 1  View  03/27/2013   CLINICAL DATA:  Respiratory failure.  EXAM: PORTABLE CHEST - 1 VIEW  COMPARISON:  03/26/2013  FINDINGS: Mild increase in right lung base opacity, which now obscures hemidiaphragm. This is likely due to an increase in pleural fluid. Left lung base opacity is stable consistent with a combination pleural fluid and atelectasis or infiltrate. Right perihilar peribronchial thickening and patchy airspace opacity is similar to the previous day's study.  Right PICC is stable in well positioned.  No pneumothorax.  IMPRESSION: 1. Mild increase in right lung base opacity likely due to increase in pleural fluid. 2. Stable right perihilar and bilateral lung base peribronchial and patchy airspace infiltrates and lung base atelectasis.   Electronically Signed   By: Lajean Manes M.D.   On: 03/27/2013 07:35   Dg Chest Port 1 View  03/26/2013   CLINICAL DATA:  Right arm PICC placement  EXAM: PORTABLE CHEST - 1 VIEW  COMPARISON:  Earlier film of the same day  FINDINGS: There has been interval placement of right arm PICC, tip at the cavoatrial junction. Stable perihilar and bibasilar infiltrates or edema. Possible small effusions. Stable cardiomegaly. .  Visualized skeletal structures are unremarkable.  IMPRESSION: Interval PICC line placement to the cavoatrial junction.   Electronically Signed   By:  Arne Cleveland M.D.   On: 03/26/2013 11:16    Anti-infectives: Anti-infectives   Start     Dose/Rate Route Frequency Ordered Stop   03/24/13 0800  ciprofloxacin (CIPRO) IVPB 400 mg     400 mg 200 mL/hr over 60 Minutes Intravenous Every 24 hours 03/23/13 1241     03/23/13 1700  metroNIDAZOLE (FLAGYL) IVPB 500 mg     500 mg 100 mL/hr over 60 Minutes Intravenous Every 8 hours 03/23/13 1236     03/23/13 0730  [MAR Hold]  ciprofloxacin (CIPRO) IVPB 400 mg     (On MAR Hold since 03/23/13 0805)   400 mg 200 mL/hr over 60 Minutes Intravenous On call to O.R. 03/23/13 0728 03/23/13 0824   03/23/13 0730  [MAR  Hold]  metroNIDAZOLE (FLAGYL) IVPB 500 mg     (On MAR Hold since 03/23/13 0805)   500 mg 100 mL/hr over 60 Minutes Intravenous On call to O.R. 03/23/13 8242 03/23/13 0910       Assessment/Plan  1. POD 5, s/p ex lap with SBR for ischemic bowel  2. Metabolic acidosis 3. VDRF, required reintubation 03-27-13 4. ARF 5. PCM/TNA  Plan: 1. Had a meeting with the patient's daughter and niece this morning.  They did say they do not want CPR or long term measures, but would like to give her some time to see how she does on the vent prior to making a definite decision.  We will plan on remeeting on Thursday to see how she is doing on the vent and to see if she is close to being extubated or if we anticipate long-term weaning process or need for trach etc.  I think the family would not want something like this.  We can readdress on THursday. 2. Cont TNA while bowel function returns prior to TFs 3. Follow creatinine 4. Cont NGT for now. 5. Vent per CCM, appreciate their assistance.   LOS: 6 days    Darrell Leonhardt E 03/28/2013, 9:41 AM Pager: [phone]

## 2013-03-29 ENCOUNTER — Inpatient Hospital Stay (HOSPITAL_COMMUNITY): Payer: Medicare Other

## 2013-03-29 LAB — BASIC METABOLIC PANEL
BUN: 72 mg/dL — ABNORMAL HIGH (ref 6–23)
CALCIUM: 7.8 mg/dL — AB (ref 8.4–10.5)
CO2: 21 mEq/L (ref 19–32)
Chloride: 107 mEq/L (ref 96–112)
Creatinine, Ser: 3.28 mg/dL — ABNORMAL HIGH (ref 0.50–1.10)
GFR, EST AFRICAN AMERICAN: 14 mL/min — AB (ref >90)
GFR, EST NON AFRICAN AMERICAN: 12 mL/min — AB (ref >90)
Glucose, Bld: 278 mg/dL — ABNORMAL HIGH (ref 70–99)
POTASSIUM: 3.2 meq/L — AB (ref 3.7–5.3)
SODIUM: 141 meq/L (ref 137–147)

## 2013-03-29 LAB — CBC
HEMATOCRIT: 22.1 % — AB (ref 36.0–46.0)
Hemoglobin: 7.5 g/dL — ABNORMAL LOW (ref 12.0–15.0)
MCH: 25 pg — ABNORMAL LOW (ref 26.0–34.0)
MCHC: 33.9 g/dL (ref 30.0–36.0)
MCV: 73.7 fL — ABNORMAL LOW (ref 78.0–100.0)
PLATELETS: 201 10*3/uL (ref 150–400)
RBC: 3 MIL/uL — ABNORMAL LOW (ref 3.87–5.11)
RDW: 20.1 % — ABNORMAL HIGH (ref 11.5–15.5)
WBC: 14.1 10*3/uL — AB (ref 4.0–10.5)

## 2013-03-29 LAB — GLUCOSE, CAPILLARY
GLUCOSE-CAPILLARY: 149 mg/dL — AB (ref 70–99)
GLUCOSE-CAPILLARY: 172 mg/dL — AB (ref 70–99)
GLUCOSE-CAPILLARY: 199 mg/dL — AB (ref 70–99)
GLUCOSE-CAPILLARY: 279 mg/dL — AB (ref 70–99)
Glucose-Capillary: 306 mg/dL — ABNORMAL HIGH (ref 70–99)
Glucose-Capillary: 161 mg/dL — ABNORMAL HIGH (ref 70–99)

## 2013-03-29 LAB — PHOSPHORUS: PHOSPHORUS: 3.2 mg/dL (ref 2.3–4.6)

## 2013-03-29 LAB — MAGNESIUM: Magnesium: 1.5 mg/dL (ref 1.5–2.5)

## 2013-03-29 MED ORDER — MAGNESIUM SULFATE 40 MG/ML IJ SOLN
2.0000 g | Freq: Once | INTRAMUSCULAR | Status: AC
  Administered 2013-03-29 – 2013-03-30 (×2): 2 g via INTRAVENOUS
  Filled 2013-03-29: qty 50

## 2013-03-29 MED ORDER — INSULIN ASPART 100 UNIT/ML ~~LOC~~ SOLN
0.0000 [IU] | SUBCUTANEOUS | Status: DC
  Administered 2013-03-29: 15 [IU] via SUBCUTANEOUS
  Administered 2013-03-29 (×2): 4 [IU] via SUBCUTANEOUS
  Administered 2013-03-29: 3 [IU] via SUBCUTANEOUS
  Administered 2013-03-30 (×4): 4 [IU] via SUBCUTANEOUS
  Administered 2013-03-30: 7 [IU] via SUBCUTANEOUS
  Administered 2013-03-31: 11 [IU] via SUBCUTANEOUS
  Administered 2013-03-31: 4 [IU] via SUBCUTANEOUS
  Administered 2013-03-31: 3 [IU] via SUBCUTANEOUS
  Administered 2013-03-31 – 2013-04-01 (×3): 4 [IU] via SUBCUTANEOUS
  Administered 2013-04-01: 11 [IU] via SUBCUTANEOUS
  Administered 2013-04-01: 15 [IU] via SUBCUTANEOUS
  Administered 2013-04-01 (×3): 11 [IU] via SUBCUTANEOUS
  Administered 2013-04-01: 7 [IU] via SUBCUTANEOUS
  Administered 2013-04-02: 11 [IU] via SUBCUTANEOUS
  Administered 2013-04-02: 4 [IU] via SUBCUTANEOUS
  Administered 2013-04-02: 11 [IU] via SUBCUTANEOUS
  Administered 2013-04-02: 4 [IU] via SUBCUTANEOUS
  Administered 2013-04-02: 7 [IU] via SUBCUTANEOUS
  Administered 2013-04-03: 3 [IU] via SUBCUTANEOUS
  Administered 2013-04-03: 4 [IU] via SUBCUTANEOUS
  Administered 2013-04-03: 3 [IU] via SUBCUTANEOUS
  Administered 2013-04-03 – 2013-04-04 (×6): 4 [IU] via SUBCUTANEOUS
  Administered 2013-04-04: 3 [IU] via SUBCUTANEOUS
  Administered 2013-04-04: 4 [IU] via SUBCUTANEOUS
  Administered 2013-04-05: 3 [IU] via SUBCUTANEOUS
  Administered 2013-04-05: 4 [IU] via SUBCUTANEOUS
  Administered 2013-04-05 (×2): 3 [IU] via SUBCUTANEOUS
  Administered 2013-04-06 (×2): 4 [IU] via SUBCUTANEOUS
  Administered 2013-04-06 – 2013-04-07 (×4): 3 [IU] via SUBCUTANEOUS
  Administered 2013-04-07: 4 [IU] via SUBCUTANEOUS
  Administered 2013-04-08 – 2013-04-11 (×6): 3 [IU] via SUBCUTANEOUS

## 2013-03-29 MED ORDER — TRACE MINERALS CR-CU-F-FE-I-MN-MO-SE-ZN IV SOLN
INTRAVENOUS | Status: AC
  Administered 2013-03-29: 18:00:00 via INTRAVENOUS
  Filled 2013-03-29: qty 2000

## 2013-03-29 MED ORDER — POTASSIUM CHLORIDE 10 MEQ/50ML IV SOLN
10.0000 meq | INTRAVENOUS | Status: AC
  Administered 2013-03-29 (×3): 10 meq via INTRAVENOUS
  Filled 2013-03-29 (×3): qty 50

## 2013-03-29 NOTE — Progress Notes (Signed)
Physical Therapy Treatment Patient Details Name: Kristi Alexander MRN: [id number] DOB: Oct 01, 1925 Today's Date: 03/29/2013 Time: 4656-8127 PT Time Calculation (min): 24 min  PT Assessment / Plan / Recommendation  History of Present Illness Pt s/p exp. lap and resection of 4 foot section of necrotic bowel.  pt back to room intubated.  Extubated 1/29.   PT Comments   Still looking good after her decline over the weekend.  Hopefully will wean soon to get back out in the hall.  Follow Up Recommendations  SNF     Does the patient have the potential to tolerate intense rehabilitation     Barriers to Discharge        Equipment Recommendations       Recommendations for Other Services    Frequency Min 3X/week   Progress towards PT Goals Progress towards PT goals: Progressing toward goals  Plan Current plan remains appropriate    Precautions / Restrictions Precautions Precautions: Fall Restrictions Weight Bearing Restrictions:  (sternal precautions)   Pertinent Vitals/Pain VSS on vent.    Mobility  Bed Mobility Overal bed mobility: Needs Assistance Bed Mobility: Supine to Sit Supine to sit: Mod assist (pt = 70%) General bed mobility comments: Bridged well to EOB and needed truncal assist to fully sit up. Transfers Overall transfer level: Needs assistance Transfers: Sit to/from Stand;Stand Pivot Transfers Sit to Stand: Mod assist;+2 safety/equipment Stand pivot transfers: Mod assist;+2 physical assistance General transfer comment: cues for transfer safety, assist to come forward and up.    Exercises     PT Diagnosis:    PT Problem List:   PT Treatment Interventions:     PT Goals (current goals can now be found in the care plan section) Acute Rehab PT Goals PT Goal Formulation: With patient Time For Goal Achievement: 04/08/13 Potential to Achieve Goals: Good  Visit Information  Last PT Received On: 03/29/13 Assistance Needed: +1 History of Present Illness: Pt s/p exp.  lap and resection of 4 foot section of necrotic bowel.  pt back to room intubated.  Extubated 1/29.    Subjective Data  Subjective: pt relates that she feels well despite being intubated.   Cognition  Cognition Arousal/Alertness: Awake/alert Behavior During Therapy: WFL for tasks assessed/performed Overall Cognitive Status: Within Functional Limits for tasks assessed    Balance  Balance Overall balance assessment: Needs assistance Sitting-balance support: No upper extremity supported;Feet supported Sitting balance-Leahy Scale: Good Sitting balance - Comments: could do LAQ alternating b/w left and right without falling backward, but able to shift weight Standing balance support: Bilateral upper extremity supported;During functional activity Standing balance-Leahy Scale: Fair  End of Session PT - End of Session Equipment Utilized During Treatment: Oxygen (VENT) Activity Tolerance: Patient tolerated treatment well Patient left: in chair;with call bell/phone within reach;with nursing/sitter in room Nurse Communication: Mobility status   GP     China Deitrick, [name] 03/29/2013, 11:49 AM 03/29/2013  Donnella Sham, PT [phone]  (pager)

## 2013-03-29 NOTE — Progress Notes (Signed)
6 Days Post-Op  Subjective: Intubated, does follow some commands  Objective: Vital signs in last 24 hours: Temp:  [97.8 F (36.6 C)-98.8 F (37.1 C)] 98.3 F (36.8 C) (02/03 0719) Pulse Rate:  [87-110] 90 (02/03 0800) Resp:  [20-33] 28 (02/03 0800) BP: (105-137)/(45-80) 132/55 mmHg (02/03 0800) SpO2:  [100 %] 100 % (02/03 0800) FiO2 (%):  [40 %-50 %] 40 % (02/03 0833) Weight:  [158 lb 15.2 oz (72.1 kg)] 158 lb 15.2 oz (72.1 kg) (02/03 0600) Last BM Date: 03/28/13  Intake/Output from previous day: 02/02 0701 - 02/03 0700 In: 3432.5 [I.V.:1482.5; NG/GT:60; IV Piggyback:500; TPN:1390] Out: 3915 [Urine:3215; Emesis/NG output:700] Intake/Output this shift: Total I/O In: 360 [I.V.:70; NG/GT:30; IV Piggyback:200; TPN:60] Out: 250 [Urine:150; Emesis/NG output:100]  General appearance: intubated Resp: coarse bilateral breath sounds Cardio: regular rate and rhythm GI: soft, mildly tender, few bs, wound clean without infection  Lab Results:   Recent Labs  03/28/13 0330 03/29/13 0500  WBC 13.5* 14.1*  HGB 7.7* 7.5*  HCT 23.0* 22.1*  PLT 180 201   BMET  Recent Labs  03/28/13 0330 03/29/13 0500  NA 142 141  K 3.7 3.2*  CL 111 107  CO2 17* 21  GLUCOSE 178* 278*  BUN 67* 72*  CREATININE 3.58* 3.28*  CALCIUM 8.0* 7.8*   PT/INR  Recent Labs  03/28/13 0330  LABPROT 15.6*  INR 1.27   ABG  Recent Labs  03/27/13 1220 03/28/13 0420  PHART 7.329* 7.311*  HCO3 16.0* 17.3*    Studies/Results: Ct Abdomen Pelvis Wo Contrast  03/27/2013   CLINICAL DATA:  Metabolic acidosis.  Recent small bowel infarction.  EXAM: CT ABDOMEN AND PELVIS WITHOUT CONTRAST  TECHNIQUE: Multidetector CT imaging of the abdomen and pelvis was performed following the standard protocol without intravenous contrast.  COMPARISON:  DG ABD PORTABLE 1V dated 03/26/2013; CT ABD/PELVIS W CM dated 03/22/2013  FINDINGS: Small to moderate bilateral pleural effusions noted with passive atelectasis in the lower  lobes. Increased interstitial accentuation in the right middle lobe and lingula with underlying cardiomegaly. Low-density blood pool favors anemia. Somewhat flat contour of the interventricular septum could be a secondary manifestation of right heart strain.  Vicarious contrast excretion into the gallbladder noted; the contrast reveals a 1.9 cm gallstone on image 42 of series 2 within the gallbladder.  There is edema around the duodenum. Lax ligament of Treitz noted. Thick walled proximal jejunum leads to dilated jejunum is observed measuring up to 4.5 cm with internal air-fluid levels. The dilated bowel extends to the anastomosis and just beyond the anastomosis where there is a transition to nondilated bowel.  I do not observe pneumatosis or obvious extraluminal gas. Pelvic ascites is present, although reduced in volume from 03/22/2013.  There is ascending colon diverticulosis. Wall thickening at the hepatic flexure was not present previously and most likely represents peristalsis. There is abnormal wall thickening in the transverse colon along with scattered colonic diverticula in the transverse colon and descending colon. Descending colon is small in caliber. Sigmoid colon diverticulosis is present.  A Foley catheter is present in the urinary bladder.  Subcutaneous edema noted along the flanks and in the pelvis, and extending into the mons pubis region.  A small left inguinal hernia contains adipose tissue. There is lumbar spondylosis and degenerative disc disease particularly at L1-2, L2-3, and L3-4.  IMPRESSION: 1. Transition in caliber of the small bowel from dilated jejunal segments with internal air-fluid levels to a nondilated segment just pass the anastomotic line  in the small bowel. Appearance concerning for the adhesion causing obstruction. 2. Abnormal thick walled segments of bowel, including the proximal jejunum, transverse colon, and a small segment of the hepatic flexure. These are nonspecific with  regard to infectious colitis/enteritis, inflammatory bowel disease, or ischemic bowel inflammation, but no extraluminal gas or pneumatosis is observed. 3. Reduced ascites compared to the prior exam. 4. Diffuse colonic diverticulosis. 5. Moderate bilateral pleural effusions with atelectasis in the lung bases. 6. Cardiomegaly. Possible flattening of the interventricular septum which may indicate right heart strain. 7. Low-density blood pool suggests anemia. 8. Subcutaneous edema along the flanks and pubis. 9. Small left inguinal hernia contains adipose tissue. 10. Lumbar spondylosis and degenerative disc disease. 11. Chololithiasis.   Electronically Signed   By: Sherryl Barters M.D.   On: 03/27/2013 16:58   Dg Chest Port 1 View  03/29/2013   CLINICAL DATA:  Recent small bowel obstruction  EXAM: PORTABLE CHEST - 1 VIEW  COMPARISON:  03/28/2013  FINDINGS: Cardiac shadow is stable. An endotracheal tube, nasogastric catheter and right-sided PICC line are again seen and stable. Diffuse bilateral patchy infiltrates are seen but stable from the prior study. No new focal infiltrate is noted. No bony abnormality is seen.  IMPRESSION: No change from the prior exam.   Electronically Signed   By: Inez Catalina M.D.   On: 03/29/2013 07:49   Dg Chest Port 1 View  03/28/2013   CLINICAL DATA:  ETT placement.  EXAM: PORTABLE CHEST - 1 VIEW  COMPARISON:  03/27/2013  FINDINGS: New endotracheal tube, tip at the level of the mid to lower thoracic trachea. New gastric suction tube, which enters the stomach. Right upper extremity PICC in stable position.  Diffuse airspace disease, particularly dense at the bases. There are layering pleural effusions, confirmed on CT abdomen from yesterday. No evidence of pneumothorax. Stable heart size. No evidence of pneumothorax.  IMPRESSION: 1. New endotracheal and orogastric tubes are in good position. 2. Unchanged bilateral airspace disease and small effusions.   Electronically Signed   By:  Jorje Guild M.D.   On: 03/28/2013 07:15    Anti-infectives: Anti-infectives   Start     Dose/Rate Route Frequency Ordered Stop   03/24/13 0800  ciprofloxacin (CIPRO) IVPB 400 mg     400 mg 200 mL/hr over 60 Minutes Intravenous Every 24 hours 03/23/13 1241     03/23/13 1700  metroNIDAZOLE (FLAGYL) IVPB 500 mg     500 mg 100 mL/hr over 60 Minutes Intravenous Every 8 hours 03/23/13 1236     03/23/13 0730  [MAR Hold]  ciprofloxacin (CIPRO) IVPB 400 mg     (On MAR Hold since 03/23/13 0805)   400 mg 200 mL/hr over 60 Minutes Intravenous On call to O.R. 03/23/13 0728 03/23/13 0824   03/23/13 0730  [MAR Hold]  metroNIDAZOLE (FLAGYL) IVPB 500 mg     (On MAR Hold since 03/23/13 0805)   500 mg 100 mL/hr over 60 Minutes Intravenous On call to O.R. 03/23/13 [id number] 03/23/13 0910      Assessment/Plan: 1. POD 6 s/p ex lap with SBR for ischemic bowel   2. VDRF, required reintubation 03-27-13  3. ARF  4. PCM/TNA  Cont ventilator wean as tolerated, appreciate ccm assistance not sure she is going to come off vent Continue c/f Lovenox/scds Continue ng tube, tna Makani Seckman 03/29/2013

## 2013-03-29 NOTE — Progress Notes (Signed)
PARENTERAL NUTRITION CONSULT NOTE - FOLLOW UP  Pharmacy Consult for TPN Indication: prolonged ileus  Allergies  Allergen Reactions  . Penicillins Swelling    Patient Measurements: Height: 5\' 2"  (157.5 cm) Weight: 158 lb 15.2 oz (72.1 kg) IBW/kg (Calculated) : 50.1  Vital Signs: Temp: 98.3 F (36.8 C) (02/03 0719) Temp src: Oral (02/03 0719) BP: 132/55 mmHg (02/03 0800) Pulse Rate: 90 (02/03 0800) Intake/Output from previous day: 02/02 0701 - 02/03 0700 In: 3432.5 [I.V.:1482.5; NG/GT:60; IV Piggyback:500; TPN:1390] Out: 7793 [Urine:3215; Emesis/NG output:700] Intake/Output from this shift: Total I/O In: 360 [I.V.:70; NG/GT:30; IV Piggyback:200; TPN:60] Out: 250 [Urine:150; Emesis/NG output:100]  Labs:  Recent Labs  03/27/13 0351 03/28/13 0330 03/29/13 0500  WBC 16.0* 13.5* 14.1*  HGB 8.3* 7.7* 7.5*  HCT 25.5* 23.0* 22.1*  PLT 230 180 201  APTT  --  33  --   INR  --  1.27  --      Recent Labs  03/27/13 0351 03/28/13 0330 03/29/13 0500  NA 144 142 141  K 4.7 3.7 3.2*  CL 113* 111 107  CO2 18* 17* 21  GLUCOSE 159* 178* 278*  BUN 61* 67* 72*  CREATININE 3.75* 3.58* 3.28*  CALCIUM 8.3* 8.0* 7.8*  MG 2.0 1.7 1.5  PHOS 5.7* 3.4 3.2  PROT 5.2* 4.9*  --   ALBUMIN 1.9* 1.7*  --   AST 61* 38*  --   ALT 45* 32  --   ALKPHOS 101 80  --   BILITOT 0.6 0.6  --   PREALBUMIN 6.2*  --   --   TRIG 99  --   --    Estimated Creatinine Clearance: 11.7 ml/min (by C-G formula based on Cr of 3.28).    Recent Labs  03/28/13 1724 03/28/13 1929 03/28/13 2346  GLUCAP 194* 226* 199*    Medications:  Infusions:  . Marland KitchenTPN (CLINIMIX-E) Adult 50 mL/hr at 03/29/13 0800   And  . fat emulsion 250 mL (03/29/13 0800)  . sodium chloride 20 mL/hr at 03/29/13 0800  .  sodium bicarbonate  infusion 1000 mL 50 mL/hr at 03/29/13 0800    Insulin Requirements in the past 24 hours:  9 units SSI, has D5 with 3 amp Hco3 at 50 ml/hr  Current Nutrition:  TPN at 50 ml/rh plus  lipids at 10 ml/hr  Assessment: 80 year old female admitted with SBO s/p ex lap and bowel resection on 1/28.  She is to on TPN for prolonged ileus. Intubated 2/1 for acute metabolic acidosis.  CT 2/1: expected post po changes.   Nutrition: baseline prealbumin 6.2, reflects post-op status and inflammatory state GI: SBO s/p ex-lap with bowel resection 1/28.  NPO. Endo: no history of DM. Got 9 units of SSI in past 24 hours. Has D5w with 3 amp HCO3 at 50 ml/hr which may be contributing to hyperglycemia. Lytes:  K down to 3.2, Mag down to 1.5.  Phos 3.2, lytes trending down Renal: SCr 3.28, decreasing trend; I/O -532, got 2 doses of lasix 40 IV yesterday,  UOP 1.9 ml/kg/hr Pulm: intubated 2/1 Hepatobil: AST/ALT ok, Tbili ok TPN Access: PICC placed 1/31 TPN day#: 4  Nutritional Goals:  RD assessment 2/2 1307  KCal, 76 - 89 grams of protein per day  Plan:   1. Increase Clinimix E 5/15 (with electrolytes) to 65 ml/hr + 20% lipids at 10 ml/hr on MWF only.  This provides a weekly daily average of 78 gm protein and 1314 kcals which is 100%  support.  2. Replace mag with 2 gm and K with 3 runs 3. Add insulin 20 units/2L bag to TPN, continue SSI 4. F/u w/ MD about D5w3amps HCO3 at 50 ml/hr - may be changed today 4. Check BMET, Mag, Phos with AM labs  Eudelia Bunch, Pharm.D. [phone] 03/29/2013 8:20 AM

## 2013-03-29 NOTE — Progress Notes (Signed)
Name: Kristi Alexander MRN: [id number] DOB: 02-10-1926    ADMISSION DATE:  03/22/2013 CONSULTATION DATE:  1/28  REFERRING MD :  Dr Hulen Skains PRIMARY SERVICE: [facility]  CHIEF COMPLAINT:  Vent and medical management   BRIEF PATIENT DESCRIPTION: 80 yo female with hx HTN admitted 1/27 with SBO.  Initially managed conservatively but cont to have worsening abd pain and increased WBC and was taken to OR 1/28 for exp lap and bowel resection.  Remained on vent post op and PCCM consulted.   SIGNIFICANT EVENTS / STUDIES:  1/27 admitted with SBO, conservative management  1/28 necrotic bowel suspected, to OR, 3-4 feet frankly necrotic bowel removed (no perforation)  2/01 Re-intubated; CT abd >> possible adhesions causing obstruction of SB, colon thickening ?infection or ischemia  LINES / TUBES: ETT 1/28>>>1/29; 2/1>>> L brachial aline 1/28>>> Rt PICC 2/1 >>  CULTURES: Urine 1/28>>> negative  ANTIBIOTICS: Flagyl 1/28>>> Cipro 1/29 >>   SUBJECTIVE:  Afebrile Good UO Denies pain  VITAL SIGNS: Temp:  [97.8 F (36.6 C)-98.8 F (37.1 C)] 98.3 F (36.8 C) (02/03 0719) Pulse Rate:  [87-110] 91 (02/03 1000) Resp:  [20-31] 29 (02/03 1000) BP: (105-137)/(45-80) 127/52 mmHg (02/03 1000) SpO2:  [100 %] 100 % (02/03 1000) FiO2 (%):  [40 %-50 %] 40 % (02/03 0833) Weight:  [72.1 kg (158 lb 15.2 oz)] 72.1 kg (158 lb 15.2 oz) (02/03 0600) VENTILATOR SETTINGS: Vent Mode:  [-] PRVC FiO2 (%):  [40 %-50 %] 40 % Set Rate:  [24 bmp] 24 bmp Vt Set:  [400 mL] 400 mL PEEP:  [5 cmH20] 5 cmH20 Plateau Pressure:  [16 cmH20-21 cmH20] 20 cmH20 INTAKE / OUTPUT: Intake/Output     02/02 0701 - 02/03 0700 02/03 0701 - 02/04 0700   I.V. (mL/kg) 1482.5 (20.6) 210 (2.9)   NG/GT 60 30   IV Piggyback 500 350   TPN 1390 180   Total Intake(mL/kg) 3432.5 (47.6) 770 (10.7)   Urine (mL/kg/hr) 3215 (1.9) 400 (1.7)   Emesis/NG output 700 (0.4) 200 (0.9)   Total Output 3915 600   Net -482.5 +170        Stool Occurrence  1 x      PHYSICAL EXAMINATION: General:  No distress Neuro:  Awake, follows commands, RASS 0 HEENT: ETT in place Cardiovascular:  s1s2 rrr Lungs: b/l rhonchi, basilar rales Abdomen:  Soft, mild tenderness RLQWound dressing clean Musculoskeletal:  1+ edema  LABS:  CBC  Recent Labs Lab 03/27/13 0351 03/28/13 0330 03/29/13 0500  WBC 16.0* 13.5* 14.1*  HGB 8.3* 7.7* 7.5*  HCT 25.5* 23.0* 22.1*  PLT 230 180 201   Coag's  Recent Labs Lab 03/22/13 2100 03/28/13 0330  APTT  --  33  INR 0.93 1.27   BMET  Recent Labs Lab 03/27/13 0351 03/28/13 0330 03/29/13 0500  NA 144 142 141  K 4.7 3.7 3.2*  CL 113* 111 107  CO2 18* 17* 21  BUN 61* 67* 72*  CREATININE 3.75* 3.58* 3.28*  GLUCOSE 159* 178* 278*   Electrolytes  Recent Labs Lab 03/27/13 0351 03/28/13 0330 03/29/13 0500  CALCIUM 8.3* 8.0* 7.8*  MG 2.0 1.7 1.5  PHOS 5.7* 3.4 3.2   Sepsis Markers  Recent Labs Lab 03/23/13 1430 03/27/13 0939 03/28/13 0355  LATICACIDVEN 2.1 1.1 1.3   ABG  Recent Labs Lab 03/27/13 1015 03/27/13 1220 03/28/13 0420  PHART 7.226* 7.329* 7.311*  PCO2ART 43.1 30.3* 35.5  PO2ART 97.0 63.0* 91.1   Liver Enzymes  Recent Labs  Lab 03/22/13 1248 03/27/13 0351 03/28/13 0330  AST 20 61* 38*  ALT 11 45* 32  ALKPHOS 98 101 80  BILITOT 0.6 0.6 0.6  ALBUMIN 3.3* 1.9* 1.7*   Glucose  Recent Labs Lab 03/28/13 0720 03/28/13 0751 03/28/13 1138 03/28/13 1724 03/28/13 1929 03/28/13 2346  GLUCAP 181* 177* 153* 194* 226* 199*    Imaging Ct Abdomen Pelvis Wo Contrast  03/27/2013   CLINICAL DATA:  Metabolic acidosis.  Recent small bowel infarction.  EXAM: CT ABDOMEN AND PELVIS WITHOUT CONTRAST  TECHNIQUE: Multidetector CT imaging of the abdomen and pelvis was performed following the standard protocol without intravenous contrast.  COMPARISON:  DG ABD PORTABLE 1V dated 03/26/2013; CT ABD/PELVIS W CM dated 03/22/2013  FINDINGS: Small to moderate bilateral pleural effusions  noted with passive atelectasis in the lower lobes. Increased interstitial accentuation in the right middle lobe and lingula with underlying cardiomegaly. Low-density blood pool favors anemia. Somewhat flat contour of the interventricular septum could be a secondary manifestation of right heart strain.  Vicarious contrast excretion into the gallbladder noted; the contrast reveals a 1.9 cm gallstone on image 42 of series 2 within the gallbladder.  There is edema around the duodenum. Lax ligament of Treitz noted. Thick walled proximal jejunum leads to dilated jejunum is observed measuring up to 4.5 cm with internal air-fluid levels. The dilated bowel extends to the anastomosis and just beyond the anastomosis where there is a transition to nondilated bowel.  I do not observe pneumatosis or obvious extraluminal gas. Pelvic ascites is present, although reduced in volume from 03/22/2013.  There is ascending colon diverticulosis. Wall thickening at the hepatic flexure was not present previously and most likely represents peristalsis. There is abnormal wall thickening in the transverse colon along with scattered colonic diverticula in the transverse colon and descending colon. Descending colon is small in caliber. Sigmoid colon diverticulosis is present.  A Foley catheter is present in the urinary bladder.  Subcutaneous edema noted along the flanks and in the pelvis, and extending into the mons pubis region.  A small left inguinal hernia contains adipose tissue. There is lumbar spondylosis and degenerative disc disease particularly at L1-2, L2-3, and L3-4.  IMPRESSION: 1. Transition in caliber of the small bowel from dilated jejunal segments with internal air-fluid levels to a nondilated segment just pass the anastomotic line in the small bowel. Appearance concerning for the adhesion causing obstruction. 2. Abnormal thick walled segments of bowel, including the proximal jejunum, transverse colon, and a small segment of the  hepatic flexure. These are nonspecific with regard to infectious colitis/enteritis, inflammatory bowel disease, or ischemic bowel inflammation, but no extraluminal gas or pneumatosis is observed. 3. Reduced ascites compared to the prior exam. 4. Diffuse colonic diverticulosis. 5. Moderate bilateral pleural effusions with atelectasis in the lung bases. 6. Cardiomegaly. Possible flattening of the interventricular septum which may indicate right heart strain. 7. Low-density blood pool suggests anemia. 8. Subcutaneous edema along the flanks and pubis. 9. Small left inguinal hernia contains adipose tissue. 10. Lumbar spondylosis and degenerative disc disease. 11. Chololithiasis.   Electronically Signed   By: Sherryl Barters M.D.   On: 03/27/2013 16:58   Dg Chest Port 1 View  03/29/2013   CLINICAL DATA:  Recent small bowel obstruction  EXAM: PORTABLE CHEST - 1 VIEW  COMPARISON:  03/28/2013  FINDINGS: Cardiac shadow is stable. An endotracheal tube, nasogastric catheter and right-sided PICC line are again seen and stable. Diffuse bilateral patchy infiltrates are seen but stable from the  prior study. No new focal infiltrate is noted. No bony abnormality is seen.  IMPRESSION: No change from the prior exam.   Electronically Signed   By: Inez Catalina M.D.   On: 03/29/2013 07:49   Dg Chest Port 1 View  03/28/2013   CLINICAL DATA:  ETT placement.  EXAM: PORTABLE CHEST - 1 VIEW  COMPARISON:  03/27/2013  FINDINGS: New endotracheal tube, tip at the level of the mid to lower thoracic trachea. New gastric suction tube, which enters the stomach. Right upper extremity PICC in stable position.  Diffuse airspace disease, particularly dense at the bases. There are layering pleural effusions, confirmed on CT abdomen from yesterday. No evidence of pneumothorax. Stable heart size. No evidence of pneumothorax.  IMPRESSION: 1. New endotracheal and orogastric tubes are in good position. 2. Unchanged bilateral airspace disease and small  effusions.   Electronically Signed   By: Jorje Guild M.D.   On: 03/28/2013 07:15   ASSESSMENT / PLAN:  PULMONARY A: Acute respiratory failure in setting of ischemic bowel and sepsis.  Re-intubated 2/1. Pleural effusions. P:   -SBTs -f/u CXR  CARDIOVASCULAR A: Severe sepsis >> resolved. Hx of HTN. P:  -continue scheduled IV lopressor  RENAL A: Acute kidney injury (baseline creatinine 0.9 from 03/22/13) -improving Non-anion gap metabolic acidosis. Lactic acidosis >> resolved. Hypervolemia. P:   -monitor renal fx urine outpt. Electrolytes -ct HCO3 gtt - hope to dc in 24h - lasix 40 mg IV q8h x 2 on 2/02  GASTROINTESTINAL A: SBO from ischemic bowel >> CT abd findings from 2/01 noted. P:   -post-op care, nutrition per [facility] -protonix for SUP  HEMATOLOGIC A: Anemia of critical illness. P:  -f/u CBC -lovenox for DVT prevention  INFECTIOUS A: Severe sepsis 2nd to ischemic bowel. P:   -continue cipro/flagyl per [facility]  ENDOCRINE A: Hyperglycemia. P:   -SSI -resistant scale  NEUROLOGIC A: Post op pain  P:   -PRN fentanyl.  Summary: Concerned that she will require very long rehab course and is at risk for multiple complications during this process.  She was previously DNR status.  Depending on family wishes & progress with weaning, will need to determine goals of care regarding  Tracheostomy vs one way extubation  CC time 35 minutes.   Kara Mead MD. [place] Flood. [facility] Pager [phone] If no response call [phone]   03/29/2013, 10:11 AM

## 2013-03-29 NOTE — Progress Notes (Signed)
Pt. Placed up into chair per Physical Therapy, tolerating well, RT to monitor.

## 2013-03-29 NOTE — Progress Notes (Signed)
Order for PICC exchange placed 2/2 d/t clotted port on pts PICC line. Port TPA'd with success.  PICC exchange order dc'd.

## 2013-03-30 ENCOUNTER — Inpatient Hospital Stay (HOSPITAL_COMMUNITY): Payer: Medicare Other

## 2013-03-30 LAB — CBC
HEMATOCRIT: 20.7 % — AB (ref 36.0–46.0)
HEMOGLOBIN: 7 g/dL — AB (ref 12.0–15.0)
MCH: 24.9 pg — AB (ref 26.0–34.0)
MCHC: 33.8 g/dL (ref 30.0–36.0)
MCV: 73.7 fL — AB (ref 78.0–100.0)
Platelets: 225 10*3/uL (ref 150–400)
RBC: 2.81 MIL/uL — AB (ref 3.87–5.11)
RDW: 19.9 % — ABNORMAL HIGH (ref 11.5–15.5)
WBC: 13.2 10*3/uL — AB (ref 4.0–10.5)

## 2013-03-30 LAB — BASIC METABOLIC PANEL
BUN: 74 mg/dL — ABNORMAL HIGH (ref 6–23)
BUN: 67 mg/dL — ABNORMAL HIGH (ref 6–23)
CHLORIDE: 106 meq/L (ref 96–112)
CO2: 26 meq/L (ref 19–32)
CO2: 24 mEq/L (ref 19–32)
Calcium: 8 mg/dL — ABNORMAL LOW (ref 8.4–10.5)
Calcium: 7.6 mg/dL — ABNORMAL LOW (ref 8.4–10.5)
Chloride: 106 mEq/L (ref 96–112)
Creatinine, Ser: 2.73 mg/dL — ABNORMAL HIGH (ref 0.50–1.10)
Creatinine, Ser: 2.25 mg/dL — ABNORMAL HIGH (ref 0.50–1.10)
GFR calc Af Amer: 17 mL/min — ABNORMAL LOW (ref >90)
GFR calc non Af Amer: 15 mL/min — ABNORMAL LOW (ref >90)
GFR, EST AFRICAN AMERICAN: 22 mL/min — AB (ref >90)
GFR, EST NON AFRICAN AMERICAN: 19 mL/min — AB (ref >90)
GLUCOSE: 170 mg/dL — AB (ref 70–99)
GLUCOSE: 550 mg/dL — AB (ref 70–99)
POTASSIUM: 3.1 meq/L — AB (ref 3.7–5.3)
POTASSIUM: 4.3 meq/L (ref 3.7–5.3)
SODIUM: 144 meq/L (ref 137–147)
SODIUM: 141 meq/L (ref 137–147)

## 2013-03-30 LAB — GLUCOSE, CAPILLARY
GLUCOSE-CAPILLARY: 166 mg/dL — AB (ref 70–99)
Glucose-Capillary: 116 mg/dL — ABNORMAL HIGH (ref 70–99)
Glucose-Capillary: 153 mg/dL — ABNORMAL HIGH (ref 70–99)
Glucose-Capillary: 156 mg/dL — ABNORMAL HIGH (ref 70–99)
Glucose-Capillary: 185 mg/dL — ABNORMAL HIGH (ref 70–99)
Glucose-Capillary: 208 mg/dL — ABNORMAL HIGH (ref 70–99)

## 2013-03-30 LAB — POCT I-STAT 3, ART BLOOD GAS (G3+)
Acid-Base Excess: 3 mmol/L — ABNORMAL HIGH (ref 0.0–2.0)
Bicarbonate: 28.1 mEq/L — ABNORMAL HIGH (ref 20.0–24.0)
O2 SAT: 99 %
PCO2 ART: 44.3 mmHg (ref 35.0–45.0)
Patient temperature: 98.4
TCO2: 29 mmol/L (ref 0–100)
pH, Arterial: 7.41 (ref 7.350–7.450)
pO2, Arterial: 134 mmHg — ABNORMAL HIGH (ref 80.0–100.0)

## 2013-03-30 LAB — GLUCOSE, RANDOM
Glucose, Bld: 122 mg/dL — ABNORMAL HIGH (ref 70–99)
Glucose, Bld: 515 mg/dL — ABNORMAL HIGH (ref 70–99)

## 2013-03-30 LAB — PHOSPHORUS: Phosphorus: 3.2 mg/dL (ref 2.3–4.6)

## 2013-03-30 LAB — MAGNESIUM: MAGNESIUM: 1.9 mg/dL (ref 1.5–2.5)

## 2013-03-30 MED ORDER — FAT EMULSION 20 % IV EMUL
250.0000 mL | INTRAVENOUS | Status: AC
  Administered 2013-03-30: 250 mL via INTRAVENOUS
  Filled 2013-03-30: qty 250

## 2013-03-30 MED ORDER — MAGNESIUM SULFATE 40 MG/ML IJ SOLN
INTRAMUSCULAR | Status: AC
  Filled 2013-03-30: qty 50

## 2013-03-30 MED ORDER — POTASSIUM CHLORIDE 10 MEQ/50ML IV SOLN
10.0000 meq | INTRAVENOUS | Status: AC
  Administered 2013-03-30: 10 meq via INTRAVENOUS
  Filled 2013-03-30: qty 50

## 2013-03-30 MED ORDER — POTASSIUM CHLORIDE 10 MEQ/50ML IV SOLN
10.0000 meq | INTRAVENOUS | Status: DC
  Administered 2013-03-30 (×3): 10 meq via INTRAVENOUS
  Filled 2013-03-30 (×2): qty 50

## 2013-03-30 MED ORDER — TRACE MINERALS CR-CU-F-FE-I-MN-MO-SE-ZN IV SOLN
INTRAVENOUS | Status: AC
  Administered 2013-03-30: 18:00:00 via INTRAVENOUS
  Filled 2013-03-30: qty 2000

## 2013-03-30 MED ORDER — MAGNESIUM SULFATE IN D5W 10-5 MG/ML-% IV SOLN
1.0000 g | Freq: Once | INTRAVENOUS | Status: AC
  Administered 2013-03-30: 1 g via INTRAVENOUS
  Filled 2013-03-30: qty 100

## 2013-03-30 NOTE — Progress Notes (Signed)
RT calliong about ABG on SBT 5/5  - 7.41/44/134  RR 30   Vt 300  Tobin ratio 100  DNA- Reintubation  No cuff leak per RT  PLAN No extubation 03/30/2013   Dr. Brand Males, M.D., [facility].C.P Pulmonary and Critical Care Medicine Staff Physician [facility] Pager: [phone], If no answer or between  15:00h - 7:00h: call [phone]  03/30/2013 3:53 PM

## 2013-03-30 NOTE — Progress Notes (Signed)
Name: Kristi Alexander MRN: [id number] DOB: [birth year]    ADMISSION DATE:  03/22/2013 CONSULTATION DATE:  1/28  REFERRING MD :  Dr Hulen Skains PRIMARY SERVICE: [facility]  CHIEF COMPLAINT:  Vent and medical management   BRIEF PATIENT DESCRIPTION: 80 yo female with hx HTN admitted 1/27 with SBO.  Initially managed conservatively but cont to have worsening abd pain and increased WBC and was taken to OR 1/28 for exp lap and bowel resection.  Remained on vent post op and PCCM consulted.   SIGNIFICANT EVENTS / STUDIES:  1/27 admitted with SBO, conservative management  1/28 necrotic bowel suspected, to OR, 3-4 feet frankly necrotic bowel removed (no perforation)  2/01 Re-intubated; CT abd >> possible adhesions causing obstruction of SB, colon thickening ?infection or ischemia  LINES / TUBES: ETT 1/28>>>1/29; 2/1>>> L brachial aline 1/28>>> Rt PICC 2/1 >>  CULTURES: Urine 1/28>>> negative  ANTIBIOTICS: Flagyl 1/28>>> Cipro 1/29 >>   SUBJECTIVE:  Afebrile Good UO, off lasix Denies pain  VITAL SIGNS: Temp:  [97.9 F (36.6 C)-98.6 F (37 C)] 97.9 F (36.6 C) (02/04 0713) Pulse Rate:  [69-94] 82 (02/04 0600) Resp:  [16-33] 25 (02/04 0600) BP: (112-135)/(44-56) 122/54 mmHg (02/04 0600) SpO2:  [100 %] 100 % (02/04 0600) FiO2 (%):  [40 %] 40 % (02/04 0400) VENTILATOR SETTINGS: Vent Mode:  [-] PRVC FiO2 (%):  [40 %] 40 % Set Rate:  [18 bmp-24 bmp] 18 bmp Vt Set:  [400 mL] 400 mL PEEP:  [5 cmH20] 5 cmH20 Plateau Pressure:  [14 cmH20-20 cmH20] 16 cmH20 INTAKE / OUTPUT: Intake/Output     02/03 0701 - 02/04 0700 02/04 0701 - 02/05 0700   I.V. (mL/kg) 1610 (22.3)    NG/GT 120    IV Piggyback 700    TPN 1442.1    Total Intake(mL/kg) 3872.1 (53.7)    Urine (mL/kg/hr) 2045 (1.2)    Emesis/NG output 800 (0.5)    Total Output 2845     Net +1027.1            PHYSICAL EXAMINATION: General:  No distress Neuro:  Awake, follows commands, RASS 0 HEENT: ETT in place Cardiovascular:  s1s2  rrr Lungs: b/l rhonchi, basilar rales Abdomen:  Soft, mild tenderness RLQWound dressing clean Musculoskeletal:  1+ edema  LABS:  CBC  Recent Labs Lab 03/28/13 0330 03/29/13 0500 03/30/13 0355  WBC 13.5* 14.1* 13.2*  HGB 7.7* 7.5* 7.0*  HCT 23.0* 22.1* 20.7*  PLT 180 201 225   Coag's  Recent Labs Lab 03/28/13 0330  APTT 33  INR 1.27   BMET  Recent Labs Lab 03/28/13 0330 03/29/13 0500 03/30/13 0355  NA 142 141 144  K 3.7 3.2* 3.1*  CL 111 107 106  CO2 17* 21 26  BUN 67* 72* 74*  CREATININE 3.58* 3.28* 2.73*  GLUCOSE 178* 278* 170*   Electrolytes  Recent Labs Lab 03/28/13 0330 03/29/13 0500 03/30/13 0355  CALCIUM 8.0* 7.8* 8.0*  MG 1.7 1.5 1.9  PHOS 3.4 3.2 3.2   Sepsis Markers  Recent Labs Lab 03/23/13 1430 03/27/13 0939 03/28/13 0355  LATICACIDVEN 2.1 1.1 1.3   ABG  Recent Labs Lab 03/27/13 1015 03/27/13 1220 03/28/13 0420  PHART 7.226* 7.329* 7.311*  PCO2ART 43.1 30.3* 35.5  PO2ART 97.0 63.0* 91.1   Liver Enzymes  Recent Labs Lab 03/27/13 0351 03/28/13 0330  AST 61* 38*  ALT 45* 32  ALKPHOS 101 80  BILITOT 0.6 0.6  ALBUMIN 1.9* 1.7*   Glucose  Recent  Labs Lab 03/29/13 1120 03/29/13 1608 03/29/13 1934 03/29/13 2318 03/30/13 0357 03/30/13 0717  GLUCAP 306* 172* 161* 149* 166* 153*    Imaging Dg Chest Port 1 View  03/30/2013   CLINICAL DATA:  Endotracheal tube position  EXAM: PORTABLE CHEST - 1 VIEW  COMPARISON:  Prior radiograph from 03/26/2013  FINDINGS: Endotracheal tube is in place with tip positioned 2.6 cm above the carina. Right-sided PICC catheter is again seen, unchanged. Enteric tube courses into the abdomen. Cardiomegaly is unchanged.  Lungs are hypoinflated. Patchy opacities within the right lung base are slightly worsened as compared to the prior exam. Patchy left basilar opacities are not significantly changed. Small bilateral pleural effusions are suspected, right greater than left. No pneumothorax. No  overt pulmonary edema.  Osseous structures are unchanged.  IMPRESSION: 1. Tip of the endotracheal tube 2.6 cm above the carina. Stable position of additional support apparatus. 2. Slight interval worsening of patchy right basilar opacity with similar left basilar opacity. Findings may reflect atelectasis or possibly infiltrates. 3. Probable small bilateral pleural effusions.   Electronically Signed   By: Jeannine Boga M.D.   On: 03/30/2013 06:01   Dg Chest Port 1 View  03/29/2013   CLINICAL DATA:  Recent small bowel obstruction  EXAM: PORTABLE CHEST - 1 VIEW  COMPARISON:  03/28/2013  FINDINGS: Cardiac shadow is stable. An endotracheal tube, nasogastric catheter and right-sided PICC line are again seen and stable. Diffuse bilateral patchy infiltrates are seen but stable from the prior study. No new focal infiltrate is noted. No bony abnormality is seen.  IMPRESSION: No change from the prior exam.   Electronically Signed   By: Inez Catalina M.D.   On: 03/29/2013 07:49   ASSESSMENT / PLAN:  PULMONARY A: Acute respiratory failure in setting of ischemic bowel and sepsis.  Re-intubated 2/1. Pleural effusions. P:   -SBTs -get Abgif tolerates 5/5   CARDIOVASCULAR A: Severe sepsis >> resolved. Hx of HTN. P:  -continue scheduled IV lopressor  RENAL A: Acute kidney injury (baseline creatinine 0.9 from 03/22/13) -improving Non-anion gap metabolic acidosis. Lactic acidosis >> resolved. Hypervolemia. P:   -monitor renal fx urine outpt. Electrolytes -dc HCO3 gtt  - replete K  GASTROINTESTINAL A: SBO from ischemic bowel >> CT abd findings from 2/01 noted. Protein calorie malnutrition P:   -post-op care, nutrition per CCS -protonix for SUP -Consider starting trickle feeds  HEMATOLOGIC A: Anemia of critical illness. P:  -transfuse 1U PRBC once Hb < 7 -lovenox for DVT prevention  INFECTIOUS A: Severe sepsis 2nd to ischemic bowel. P:   -continue cipro/flagyl per  CCS  ENDOCRINE A: Hyperglycemia. P:   -SSI -resistant scale  NEUROLOGIC A: Post op pain  P:   -PRN fentanyl.  Summary: Concerned that she will require very long rehab course and is at risk for multiple complications during this process.  She was previously DNR status.  Depending on family wishes & progress with weaning, will need to determine goals of care regarding  Tracheostomy vs one way extubation  CC time 35 minutes.   Kara Mead MD. Shade Flood. [facility] Pager [phone] If no response call [phone]   03/30/2013, 7:47 AM

## 2013-03-30 NOTE — Progress Notes (Signed)
Patient ID: Kristi Alexander, female   DOB: 13-Dec-1925, 80 y.o.   MRN: [id number] 7 Days Post-Op  Subjective: Pt c/o some upper abdominal pain this morning.    Objective: Vital signs in last 24 hours: Temp:  [97.9 F (36.6 C)-98.6 F (37 C)] 97.9 F (36.6 C) (02/04 0713) Pulse Rate:  [69-94] 82 (02/04 0600) Resp:  [16-33] 25 (02/04 0600) BP: (112-135)/(44-56) 122/54 mmHg (02/04 0600) SpO2:  [100 %] 100 % (02/04 0600) FiO2 (%):  [40 %] 40 % (02/04 0400) Last BM Date: 03/28/13  Intake/Output from previous day: 02/03 0701 - 02/04 0700 In: 3872.1 [I.V.:1610; NG/GT:120; IV Piggyback:700; TPN:1442.1] Out: 2845 [Urine:2045; Emesis/NG output:800] Intake/Output this shift:    PE: Abd: soft, tender across the upper abdomen, absent BS, incision c/d/i with staples, NGT in place with bilious output. Heart: regular  Lab Results:   Recent Labs  03/29/13 0500 03/30/13 0355  WBC 14.1* 13.2*  HGB 7.5* 7.0*  HCT 22.1* 20.7*  PLT 201 225   BMET  Recent Labs  03/29/13 0500 03/30/13 0355  NA 141 144  K 3.2* 3.1*  CL 107 106  CO2 21 26  GLUCOSE 278* 170*  BUN 72* 74*  CREATININE 3.28* 2.73*  CALCIUM 7.8* 8.0*   PT/INR  Recent Labs  03/28/13 0330  LABPROT 15.6*  INR 1.27   CMP     Component Value Date/Time   NA 144 03/30/2013 0355   K 3.1* 03/30/2013 0355   CL 106 03/30/2013 0355   CO2 26 03/30/2013 0355   GLUCOSE 170* 03/30/2013 0355   BUN 74* 03/30/2013 0355   CREATININE 2.73* 03/30/2013 0355   CALCIUM 8.0* 03/30/2013 0355   PROT 4.9* 03/28/2013 0330   ALBUMIN 1.7* 03/28/2013 0330   AST 38* 03/28/2013 0330   ALT 32 03/28/2013 0330   ALKPHOS 80 03/28/2013 0330   BILITOT 0.6 03/28/2013 0330   GFRNONAA 15* 03/30/2013 0355   GFRAA 17* 03/30/2013 0355   Lipase  No results found for this basename: lipase       Studies/Results: Dg Chest Port 1 View  03/30/2013   CLINICAL DATA:  Endotracheal tube position  EXAM: PORTABLE CHEST - 1 VIEW  COMPARISON:  Prior radiograph from 03/26/2013   FINDINGS: Endotracheal tube is in place with tip positioned 2.6 cm above the carina. Right-sided PICC catheter is again seen, unchanged. Enteric tube courses into the abdomen. Cardiomegaly is unchanged.  Lungs are hypoinflated. Patchy opacities within the right lung base are slightly worsened as compared to the prior exam. Patchy left basilar opacities are not significantly changed. Small bilateral pleural effusions are suspected, right greater than left. No pneumothorax. No overt pulmonary edema.  Osseous structures are unchanged.  IMPRESSION: 1. Tip of the endotracheal tube 2.6 cm above the carina. Stable position of additional support apparatus. 2. Slight interval worsening of patchy right basilar opacity with similar left basilar opacity. Findings may reflect atelectasis or possibly infiltrates. 3. Probable small bilateral pleural effusions.   Electronically Signed   By: Jeannine Boga M.D.   On: 03/30/2013 06:01   Dg Chest Port 1 View  03/29/2013   CLINICAL DATA:  Recent small bowel obstruction  EXAM: PORTABLE CHEST - 1 VIEW  COMPARISON:  03/28/2013  FINDINGS: Cardiac shadow is stable. An endotracheal tube, nasogastric catheter and right-sided PICC line are again seen and stable. Diffuse bilateral patchy infiltrates are seen but stable from the prior study. No new focal infiltrate is noted. No bony abnormality is seen.  IMPRESSION:  No change from the prior exam.   Electronically Signed   By: Inez Catalina M.D.   On: 03/29/2013 07:49    Anti-infectives: Anti-infectives   Start     Dose/Rate Route Frequency Ordered Stop   03/24/13 0800  ciprofloxacin (CIPRO) IVPB 400 mg     400 mg 200 mL/hr over 60 Minutes Intravenous Every 24 hours 03/23/13 1241     03/23/13 1700  metroNIDAZOLE (FLAGYL) IVPB 500 mg     500 mg 100 mL/hr over 60 Minutes Intravenous Every 8 hours 03/23/13 1236     03/23/13 0730  [MAR Hold]  ciprofloxacin (CIPRO) IVPB 400 mg     (On MAR Hold since 03/23/13 0805)   400 mg 200  mL/hr over 60 Minutes Intravenous On call to O.R. 03/23/13 0728 03/23/13 0824   03/23/13 0730  [MAR Hold]  metroNIDAZOLE (FLAGYL) IVPB 500 mg     (On MAR Hold since 03/23/13 0805)   500 mg 100 mL/hr over 60 Minutes Intravenous On call to O.R. 03/23/13 2595 03/23/13 0910       Assessment/Plan 1. POD 7 s/p ex lap with SBR for ischemic bowel  2. VDRF, required reintubation 03-27-13  3. ARF  4. PCM/TNA 5. Hypokalemia  Plan: 1. Had a long d/w the patient today explaining her options as far as her vent dependency goes.  I discussed her options of trach and weaning vs one way extubation.  The patient got wide eyed and scared talking about one way extubation and not being reintubated.  She communicated with me via writing that she thinks she would want to pursue a trach.  I discussed with her that this may be a long process and require months of rehab if she was eventually able to wean from the vent.  She still felt like that was something she would want to pursue.  I will have to contact her daughter and let her know as the daughter expressed on Monday that she felt her mom would not want long-term measures such as this. 2. CCM replaced K, vent per them 3. Follow CBC 4. Cont TNA for now.  Patient has no BS and still has 800cc from NGT.  She had some colonic wall thickening on her scan with some enteral thickening as well.  Suspect her gut isn't quite ready for enteral feeds. 5. Appreciate CCM assistance with this patient.    LOS: 8 days    Kristi Alexander E 03/30/2013, 8:39 AM Pager: [phone]

## 2013-03-30 NOTE — Progress Notes (Signed)
PARENTERAL NUTRITION CONSULT NOTE - FOLLOW UP  Pharmacy Consult for TPN Indication: prolonged ileus  Allergies  Allergen Reactions  . Penicillins Swelling    Patient Measurements: Height: 5\' 2"  (157.5 cm) Weight: 158 lb 15.2 oz (72.1 kg) IBW/kg (Calculated) : 50.1  Vital Signs: Temp: 97.9 F (36.6 C) (02/04 0713) Temp src: Oral (02/04 0713) BP: 122/54 mmHg (02/04 0600) Pulse Rate: 82 (02/04 0600) Intake/Output from previous day: 02/03 0701 - 02/04 0700 In: 3872.1 [I.V.:1610; NG/GT:120; IV Piggyback:700; TPN:1442.1] Out: 2845 [Urine:2045; Emesis/NG output:800] Intake/Output from this shift:    Labs:  Recent Labs  03/28/13 0330 03/29/13 0500 03/30/13 0355  WBC 13.5* 14.1* 13.2*  HGB 7.7* 7.5* 7.0*  HCT 23.0* 22.1* 20.7*  PLT 180 201 225  APTT 33  --   --   INR 1.27  --   --      Recent Labs  03/28/13 0330 03/29/13 0500 03/30/13 0355  NA 142 141 144  K 3.7 3.2* 3.1*  CL 111 107 106  CO2 17* 21 26  GLUCOSE 178* 278* 170*  BUN 67* 72* 74*  CREATININE 3.58* 3.28* 2.73*  CALCIUM 8.0* 7.8* 8.0*  MG 1.7 1.5 1.9  PHOS 3.4 3.2 3.2  PROT 4.9*  --   --   ALBUMIN 1.7*  --   --   AST 38*  --   --   ALT 32  --   --   ALKPHOS 80  --   --   BILITOT 0.6  --   --    Estimated Creatinine Clearance: 14 ml/min (by C-G formula based on Cr of 2.73).    Recent Labs  03/29/13 2318 03/30/13 0357 03/30/13 0717  GLUCAP 149* 166* 153*    Medications:  Infusions:  . Marland KitchenTPN (CLINIMIX-E) Adult 65 mL/hr at 03/29/13 1900  . sodium chloride 20 mL/hr at 03/29/13 0800    Insulin Requirements in the past 24 hours:  7 units SSI since TPN hung with 20 units/bag,  D5 with 3 amp Hco3 at 50 ml/hr ordered to DC just now  Current Nutrition:  TPN at 65 ml/rh plus lipids at 10 ml/hr on MWF only   Assessment: 80 year old female admitted with SBO s/p ex lap and bowel resection on 1/28.  She is to on TPN for prolonged ileus. Intubated 2/1 for acute metabolic acidosis.  CT 2/1:  expected post po changes.  VDRF.  Nutrition: baseline prealbumin 6.2, reflects post-op status and inflammatory state GI: SBO s/p ex-lap with bowel resection 1/28.  NPO. NGO 800 mls.  Endo: no history of DM. Got 7 units of SSI since TPN with 20 units of insulin/bag hung. D5w with 3 amp HCO3 at 50 ml/hr to stop today - this may have been contributing to her hyperglycemia. SSI changed to resistant yesterday by CCM. Lytes:  K down to 3.1 after 3 runs yesterday- 2 runs per CCM, Mag up to 1.9 from 1.5 after 2 gm yesterday, will give another 1 gm mag.  Phos 3.2,  Renal: SCr 2.73  decreasing trend; UOP 1.2 ml/kg/hr, AKI improving, lactic acidosis resolved; hypervolemia +2845 mls Pulm: intubated 2/1 Hepatobil: AST/ALT ok, Tbili ok TPN Access: PICC placed 1/31 TPN day#: 5  Nutritional Goals:  RD assessment 2/2 1307  KCal, 76 - 89 grams of protein per day  Plan:   1. Continue Clinimix E 5/15 (with electrolytes) at 65 ml/hr + 20% lipids at 10 ml/hr on MWF only.  This provides a weekly daily average of  78 gm protein and 1314 kcals which is 100% support.  2. Replace mag with 1 gm;  She also has K 6 runs per MD 3. continue insulin 20 units/2L bag to TPN, continue Resistant SSI 4. MD stopped D5w3amps HCO3 at 50 ml/hr today 5. Thursday TPN labs ordered  Eudelia Bunch, Pharm.D. [phone] 03/30/2013 8:15 AM

## 2013-03-30 NOTE — Progress Notes (Signed)
Noticed BMET results with glucose level at 550..the patient with CBG at 1600 of 156 and received 4 u insulin SQ. Sample redraw and sent to lab. No call from lab about this value.

## 2013-03-30 NOTE — Progress Notes (Signed)
Agree with above 

## 2013-03-30 NOTE — Progress Notes (Signed)
eLink Physician-Brief Progress Note Patient Name: Kristi Alexander DOB: April 01, 1925 MRN: [id number]  Date of Service  03/30/2013   HPI/Events of Note  Hypokalemia  eICU Interventions  Potassium replaced      DETERDING,ELIZABETH 03/30/2013, 6:38 AM

## 2013-03-30 NOTE — Progress Notes (Signed)
I spoke w/ Dr. Chase Caller re: ABG and parameters. Per MD, no extubation planned for today.

## 2013-03-30 NOTE — Progress Notes (Signed)
Lab called re: repeat of glucose level (TNA stopped briefly for this sample)

## 2013-03-31 ENCOUNTER — Inpatient Hospital Stay (HOSPITAL_COMMUNITY): Payer: Medicare Other

## 2013-03-31 DIAGNOSIS — E876 Hypokalemia: Secondary | ICD-10-CM

## 2013-03-31 LAB — GLUCOSE, CAPILLARY
GLUCOSE-CAPILLARY: 134 mg/dL — AB (ref 70–99)
GLUCOSE-CAPILLARY: 169 mg/dL — AB (ref 70–99)
GLUCOSE-CAPILLARY: 251 mg/dL — AB (ref 70–99)
Glucose-Capillary: 166 mg/dL — ABNORMAL HIGH (ref 70–99)
Glucose-Capillary: 197 mg/dL — ABNORMAL HIGH (ref 70–99)
Glucose-Capillary: 198 mg/dL — ABNORMAL HIGH (ref 70–99)

## 2013-03-31 LAB — POCT I-STAT 3, ART BLOOD GAS (G3+)
ACID-BASE EXCESS: 3 mmol/L — AB (ref 0.0–2.0)
Bicarbonate: 27.4 mEq/L — ABNORMAL HIGH (ref 20.0–24.0)
O2 SAT: 95 %
PCO2 ART: 41.5 mmHg (ref 35.0–45.0)
TCO2: 29 mmol/L (ref 0–100)
pH, Arterial: 7.426 (ref 7.350–7.450)
pO2, Arterial: 75 mmHg — ABNORMAL LOW (ref 80.0–100.0)

## 2013-03-31 LAB — COMPREHENSIVE METABOLIC PANEL
ALBUMIN: 1.7 g/dL — AB (ref 3.5–5.2)
ALT: 14 U/L (ref 0–35)
AST: 19 U/L (ref 0–37)
Alkaline Phosphatase: 75 U/L (ref 39–117)
BUN: 70 mg/dL — ABNORMAL HIGH (ref 6–23)
CALCIUM: 8 mg/dL — AB (ref 8.4–10.5)
CO2: 27 mEq/L (ref 19–32)
Chloride: 108 mEq/L (ref 96–112)
Creatinine, Ser: 2.25 mg/dL — ABNORMAL HIGH (ref 0.50–1.10)
GFR calc Af Amer: 22 mL/min — ABNORMAL LOW (ref >90)
GFR calc non Af Amer: 19 mL/min — ABNORMAL LOW (ref >90)
Glucose, Bld: 174 mg/dL — ABNORMAL HIGH (ref 70–99)
Potassium: 3.5 mEq/L — ABNORMAL LOW (ref 3.7–5.3)
Sodium: 146 mEq/L (ref 137–147)
Total Bilirubin: 0.4 mg/dL (ref 0.3–1.2)
Total Protein: 4.9 g/dL — ABNORMAL LOW (ref 6.0–8.3)

## 2013-03-31 LAB — CBC
HCT: 20.9 % — ABNORMAL LOW (ref 36.0–46.0)
Hemoglobin: 7 g/dL — ABNORMAL LOW (ref 12.0–15.0)
MCH: 25 pg — ABNORMAL LOW (ref 26.0–34.0)
MCHC: 33.5 g/dL (ref 30.0–36.0)
MCV: 74.6 fL — AB (ref 78.0–100.0)
PLATELETS: 284 10*3/uL (ref 150–400)
RBC: 2.8 MIL/uL — ABNORMAL LOW (ref 3.87–5.11)
RDW: 20.3 % — AB (ref 11.5–15.5)
WBC: 13.2 10*3/uL — AB (ref 4.0–10.5)

## 2013-03-31 LAB — MAGNESIUM: Magnesium: 2.1 mg/dL (ref 1.5–2.5)

## 2013-03-31 LAB — PHOSPHORUS: PHOSPHORUS: 3.3 mg/dL (ref 2.3–4.6)

## 2013-03-31 MED ORDER — ETOMIDATE 2 MG/ML IV SOLN
40.0000 mg | Freq: Once | INTRAVENOUS | Status: DC
  Filled 2013-03-31: qty 20

## 2013-03-31 MED ORDER — CHLORHEXIDINE GLUCONATE 0.12 % MT SOLN
15.0000 mL | Freq: Two times a day (BID) | OROMUCOSAL | Status: DC
  Administered 2013-03-31 – 2013-04-02 (×5): 15 mL via OROMUCOSAL
  Filled 2013-03-31 (×4): qty 15

## 2013-03-31 MED ORDER — METHYLPREDNISOLONE SODIUM SUCC 125 MG IJ SOLR
INTRAMUSCULAR | Status: AC
  Administered 2013-03-31: 80 mg via INTRAVENOUS
  Filled 2013-03-31: qty 2

## 2013-03-31 MED ORDER — FUROSEMIDE 10 MG/ML IJ SOLN
80.0000 mg | Freq: Once | INTRAMUSCULAR | Status: AC
  Administered 2013-03-31: 80 mg via INTRAVENOUS
  Filled 2013-03-31: qty 8

## 2013-03-31 MED ORDER — FENTANYL CITRATE 0.05 MG/ML IJ SOLN
200.0000 ug | Freq: Once | INTRAMUSCULAR | Status: AC
  Administered 2013-04-01: 200 ug via INTRAVENOUS
  Filled 2013-03-31: qty 4

## 2013-03-31 MED ORDER — ETOMIDATE 2 MG/ML IV SOLN
INTRAVENOUS | Status: AC
  Filled 2013-03-31: qty 10

## 2013-03-31 MED ORDER — MIDAZOLAM HCL 2 MG/2ML IJ SOLN
INTRAMUSCULAR | Status: AC
  Filled 2013-03-31: qty 2

## 2013-03-31 MED ORDER — ETOMIDATE 2 MG/ML IV SOLN
20.0000 mg | Freq: Once | INTRAVENOUS | Status: AC
  Administered 2013-03-31: 20 mg via INTRAVENOUS

## 2013-03-31 MED ORDER — METHYLPREDNISOLONE SODIUM SUCC 125 MG IJ SOLR
80.0000 mg | Freq: Three times a day (TID) | INTRAMUSCULAR | Status: DC
  Administered 2013-03-31 – 2013-04-01 (×3): 80 mg via INTRAVENOUS
  Filled 2013-03-31 (×6): qty 1.28

## 2013-03-31 MED ORDER — RACEPINEPHRINE HCL 2.25 % IN NEBU
INHALATION_SOLUTION | RESPIRATORY_TRACT | Status: AC
  Administered 2013-03-31: 0.5 mL
  Filled 2013-03-31: qty 0.5

## 2013-03-31 MED ORDER — BIOTENE DRY MOUTH MT LIQD
15.0000 mL | Freq: Four times a day (QID) | OROMUCOSAL | Status: DC
  Administered 2013-03-31 – 2013-04-03 (×11): 15 mL via OROMUCOSAL

## 2013-03-31 MED ORDER — POTASSIUM CHLORIDE 10 MEQ/100ML IV SOLN
10.0000 meq | INTRAVENOUS | Status: AC
  Administered 2013-03-31 (×3): 10 meq via INTRAVENOUS
  Filled 2013-03-31: qty 100

## 2013-03-31 MED ORDER — MIDAZOLAM HCL 2 MG/2ML IJ SOLN: 1.0000 mg | INTRAMUSCULAR | Status: DC | PRN

## 2013-03-31 MED ORDER — RACEPINEPHRINE HCL 2.25 % IN NEBU: 0.5000 mL | INHALATION_SOLUTION | Freq: Once | RESPIRATORY_TRACT | Status: DC

## 2013-03-31 MED ORDER — VECURONIUM BROMIDE 10 MG IV SOLR: 10.0000 mg | Freq: Once | INTRAVENOUS | Status: DC

## 2013-03-31 MED ORDER — METHYLPREDNISOLONE SODIUM SUCC 125 MG IJ SOLR
80.0000 mg | INTRAMUSCULAR | Status: AC
  Administered 2013-03-31: 80 mg via INTRAVENOUS

## 2013-03-31 MED ORDER — MIDAZOLAM HCL 2 MG/2ML IJ SOLN
4.0000 mg | Freq: Once | INTRAMUSCULAR | Status: AC
  Administered 2013-04-01: 4 mg via INTRAVENOUS
  Filled 2013-03-31: qty 4

## 2013-03-31 MED ORDER — FENTANYL CITRATE 0.05 MG/ML IJ SOLN
INTRAMUSCULAR | Status: AC
  Filled 2013-03-31: qty 2

## 2013-03-31 MED ORDER — FENTANYL CITRATE 0.05 MG/ML IJ SOLN
50.0000 ug | INTRAMUSCULAR | Status: AC | PRN
  Administered 2013-03-31: 50 ug via INTRAVENOUS
  Administered 2013-03-31 (×2): 100 ug via INTRAVENOUS
  Filled 2013-03-31 (×2): qty 2

## 2013-03-31 MED ORDER — M.V.I. ADULT IV INJ
INTRAVENOUS | Status: AC
  Administered 2013-03-31: 17:00:00 via INTRAVENOUS
  Filled 2013-03-31: qty 2000

## 2013-03-31 MED ORDER — PROPOFOL 10 MG/ML IV EMUL
5.0000 ug/kg/min | Freq: Once | INTRAVENOUS | Status: AC
  Administered 2013-04-01: 15 ug/kg/min via INTRAVENOUS
  Administered 2013-04-01: 70 ug/kg/min via INTRAVENOUS

## 2013-03-31 MED ORDER — MIDAZOLAM HCL 2 MG/2ML IJ SOLN
2.0000 mg | Freq: Once | INTRAMUSCULAR | Status: AC
  Administered 2013-03-31: 2 mg via INTRAVENOUS

## 2013-03-31 NOTE — Progress Notes (Signed)
Pt with increased WOB, stridor, wheezing, RR 30's. Dr. Elsworth Soho paged, RT at bedside

## 2013-03-31 NOTE — Procedures (Addendum)
Intubation Procedure Note LATRECIA CAPITO [id number] Nov 29, 1925  Procedure: Intubation Indications: Respiratory insufficiency  Procedure Details Consent: Risks of procedure as well as the alternatives and risks of each were explained to the (patient/caregiver).  Consent for procedure obtained. Time Out: Verified patient identification, verified procedure, site/side was marked, verified correct patient position, special equipment/implants available, medications/allergies/relevent history reviewed, required imaging and test results available.  Performed  Maximum sterile technique was used including antiseptics, cap, gloves, hand hygiene and mask.  MAC and 3 Grade 2 visualisation, cords not swollen but ary epiglottic swelling, could not pass 7.5 ETT but able to pass 7.0 ETT   Evaluation Hemodynamic Status: BP stable throughout; O2 sats: transiently fell during during procedure Patient's Current Condition: stable Complications: No apparent complications Patient did tolerate procedure well. Chest X-ray ordered to verify placement.  CXR: pending.   Ryota Treece V. 03/31/2013

## 2013-03-31 NOTE — Progress Notes (Signed)
PT Cancellation Note  Patient Details Name: Kristi Alexander MRN: [id number] DOB: 06-11-1925   Cancelled Treatment:    Reason Eval/Treat Not Completed: Medical issues which prohibited therapy. Back on vent support, ?scheduled for trach tomorrow. 03/31/2013  Kristi Alexander, [name] [phone]  (pager)   Kristi Alexander, [name] 03/31/2013, 2:47 PM

## 2013-03-31 NOTE — Progress Notes (Signed)
PARENTERAL NUTRITION CONSULT NOTE - FOLLOW UP  Pharmacy Consult for TPN Indication: prolonged ileus  Allergies  Allergen Reactions  . Penicillins Swelling    Patient Measurements: Height: 5\' 2"  (157.5 cm) Weight: 160 lb 11.5 oz (72.9 kg) IBW/kg (Calculated) : 50.1  Vital Signs: Temp: 98.7 F (37.1 C) (02/05 0700) Temp src: Oral (02/05 0700) BP: 144/56 mmHg (02/05 0800) Pulse Rate: 92 (02/05 0800) Intake/Output from previous day: 02/04 0701 - 02/05 0700 In: 2680 [I.V.:500; NG/GT:30; IV Piggyback:450; TPN:1700] Out: 2300 [Urine:1700; Emesis/NG output:600] Intake/Output from this shift:    Labs:  Recent Labs  03/29/13 0500 03/30/13 0355 03/31/13 0405  WBC 14.1* 13.2* 13.2*  HGB 7.5* 7.0* 7.0*  HCT 22.1* 20.7* 20.9*  PLT 201 225 284     Recent Labs  03/29/13 0500 03/30/13 0355 03/30/13 1600 03/30/13 1753 03/30/13 1815 03/31/13 0405  NA 141 144 141  --   --  146  K 3.2* 3.1* 4.3  --   --  3.5*  CL 107 106 106  --   --  108  CO2 21 26 24   --   --  27  GLUCOSE 278* 170* 550* 515* 122* 174*  BUN 72* 74* 67*  --   --  70*  CREATININE 3.28* 2.73* 2.25*  --   --  2.25*  CALCIUM 7.8* 8.0* 7.6*  --   --  8.0*  MG 1.5 1.9  --   --   --  2.1  PHOS 3.2 3.2  --   --   --  3.3  PROT  --   --   --   --   --  4.9*  ALBUMIN  --   --   --   --   --  1.7*  AST  --   --   --   --   --  19  ALT  --   --   --   --   --  14  ALKPHOS  --   --   --   --   --  75  BILITOT  --   --   --   --   --  0.4   Estimated Creatinine Clearance: 17.1 ml/min (by C-G formula based on Cr of 2.25).    Recent Labs  03/30/13 1941 03/30/13 2348 03/31/13 0325  GLUCAP 116* 185* 166*    Medications:  Infusions:  . Marland KitchenTPN (CLINIMIX-E) Adult 65 mL/hr at 03/30/13 1820   And  . fat emulsion 250 mL (03/30/13 1819)  . sodium chloride 20 mL/hr at 03/31/13 0001    Insulin Requirements in the past 24 hours:  11 units SSI since TPN hung with 20 units/bag Current Nutrition:  TPN at 65 ml/hr  plus lipids at 10 ml/hr on MWF only   Assessment: 80 year old female admitted with SBO s/p ex lap and bowel resection on 1/28.  She is to on TPN for prolonged ileus. Intubated 2/1 for acute metabolic acidosis.  CT 2/1: expected post po changes.  VDRF.  Nutrition: baseline prealbumin 6.2, reflects post-op status and inflammatory state GI: SBO s/p ex-lap with bowel resection 1/28.  NPO. NGO 800 mls.  Endo: no history of DM. CBG 116-185.  Also on resistant SSI. Got 11 units of SSI since new TPN bag with 20 units of insulin/bag hung.   Lytes:  K 3.5. Mag 2.1, Phos 3.3. Renal: SCr 2.25  decreasing trend; UOP 0.9 ml/kg/hr, AKI improving, lactic acidosis resolved; hypervolemia +540  mls Pulm: intubated 2/1 Hepatobil: AST/ALT ok, Tbili ok TPN Access: PICC placed 1/31 TPN day#: 6  Nutritional Goals:  RD assessment 2/2 1307  KCal, 76 - 89 grams of protein per day  Plan:   1. Continue Clinimix E 5/15 (with electrolytes) at 65 ml/hr + 20% lipids at 10 ml/hr on MWF only.  This provides a weekly daily average of 78 gm protein and 1314 kcals which is 100% support.  2. continue insulin 20 units/2L bag to TPN, continue Resistant SSI 3. BMET in AM  Heide Guile, PharmD, [facility] Clinical Pharmacist Pager [phone]   03/31/2013 8:41 AM

## 2013-03-31 NOTE — Progress Notes (Signed)
RN called me to bedside d/t pt w/ SOB.  Upon entering room, noted audible stridor.  Racemic epi tx was given emergently, RN paged MD.  Dr. Elsworth Soho at bedside and order pt to be on NIV post racemic epi treatment.  Pt tol well so far. RN and MD at bedside.

## 2013-03-31 NOTE — Procedures (Signed)
Extubation Procedure Note  Patient Details:   Name: Kristi Alexander DOB: [birth year] MRN: [id number]   Airway Documentation:     Evaluation  O2 sats: stable throughout Complications: No apparent complications Patient did tolerate procedure well. Bilateral Breath Sounds: Clear;Diminished (coarse) Suctioning: Airway Yes, pt able to speak.  NO distress noted.  No stridor noted.   Lenna Sciara 03/31/2013, 10:30 AM

## 2013-03-31 NOTE — Progress Notes (Signed)
Agree with above, appreciate ccm assistance

## 2013-03-31 NOTE — Progress Notes (Signed)
[facility] ADULT ICU REPLACEMENT PROTOCOL FOR AM LAB REPLACEMENT ONLY  The patient does not apply for the [facility] Adult ICU Electrolyte Replacment Protocol based on the criteria listed below:   1. Is GFR >/= 40 ml/min? no  Patient's GFR today is 19 2. Is urine output >/= 0.5 ml/kg/hr for the last 6 hours? no Patient's UOP is  ml/kg/hr 3. Is BUN < 60 mg/dL? no  Patient's BUN today is 70 4. Abnormal electrolyte(s):K 3.5 5. Ordered repletion with: NA 6. If a panic level lab has been reported, has the CCM MD in charge been notified? yes.   Physician:   Ronda Fairly A 03/31/2013 5:37 AM

## 2013-03-31 NOTE — Progress Notes (Addendum)
Developed stridor 2 h post extubation Given racemic epi neb, solumeddrol 80 q 8h Will see if she temporises with bipap, otherwise may need intubation   Reassessed - continued to have inspiratory stridor on bipap Reintubated with 7.0 ETT Vent & sedation orders given OG reinserted Daughter updated, ct solumedrol Plan for trach -risks & benefits discussed Addn cc time x 35 mins, independent of procedure  Kristi Alexander V.

## 2013-03-31 NOTE — Progress Notes (Signed)
Patient ID: Kristi PETRAGLIA, female   DOB: 07-26-1925, 80 y.o.   MRN: [id number] 8 Days Post-Op  Subjective: Pt states she is passing flatus, but no BM.  Some abdominal discomfort.    Objective: Vital signs in last 24 hours: Temp:  [98.3 F (36.8 C)-98.7 F (37.1 C)] 98.7 F (37.1 C) (02/05 0800) Pulse Rate:  [77-98] 92 (02/05 0800) Resp:  [9-39] 29 (02/05 0800) BP: (94-159)/(41-64) 144/56 mmHg (02/05 0800) SpO2:  [99 %-100 %] 100 % (02/05 0800) FiO2 (%):  [40 %] 40 % (02/05 0750) Weight:  [160 lb 11.5 oz (72.9 kg)] 160 lb 11.5 oz (72.9 kg) (02/05 0400) Last BM Date: 03/28/13  Intake/Output from previous day: 02/04 0701 - 02/05 0700 In: 2680 [I.V.:500; NG/GT:30; IV Piggyback:450; TPN:1700] Out: 2300 [Urine:1700; Emesis/NG output:600] Intake/Output this shift:    PE: Abd: soft, appropriately tender, ND, few BS, incision c/d/i with staples  Lab Results:   Recent Labs  03/30/13 0355 03/31/13 0405  WBC 13.2* 13.2*  HGB 7.0* 7.0*  HCT 20.7* 20.9*  PLT 225 284   BMET  Recent Labs  03/30/13 1600  03/30/13 1815 03/31/13 0405  NA 141  --   --  146  K 4.3  --   --  3.5*  CL 106  --   --  108  CO2 24  --   --  27  GLUCOSE 550*  < > 122* 174*  BUN 67*  --   --  70*  CREATININE 2.25*  --   --  2.25*  CALCIUM 7.6*  --   --  8.0*  < > = values in this interval not displayed. PT/INR No results found for this basename: LABPROT, INR,  in the last 72 hours CMP     Component Value Date/Time   NA 146 03/31/2013 0405   K 3.5* 03/31/2013 0405   CL 108 03/31/2013 0405   CO2 27 03/31/2013 0405   GLUCOSE 174* 03/31/2013 0405   BUN 70* 03/31/2013 0405   CREATININE 2.25* 03/31/2013 0405   CALCIUM 8.0* 03/31/2013 0405   PROT 4.9* 03/31/2013 0405   ALBUMIN 1.7* 03/31/2013 0405   AST 19 03/31/2013 0405   ALT 14 03/31/2013 0405   ALKPHOS 75 03/31/2013 0405   BILITOT 0.4 03/31/2013 0405   GFRNONAA 19* 03/31/2013 0405   GFRAA 22* 03/31/2013 0405   Lipase  No results found for this basename: lipase        Studies/Results: Dg Chest Port 1 View  03/31/2013   CLINICAL DATA:  Status post small bowel resection  EXAM: PORTABLE CHEST - 1 VIEW  COMPARISON:  DG CHEST 1V PORT dated 03/30/2013  FINDINGS: The lungs are reasonably well inflated. The endotracheal tube tip lies approximately 2.5 cm above the crotch of the carina. The interstitial markings of the lungs remain increased but are not significantly changed. The left hemidiaphragm remains obscured. The right hemidiaphragm is better demonstrated today. The cardiac silhouette remains mildly enlarged. The pulmonary vascularity is less prominent today.  The esophagogastric tube tip and proximal port project below the inferior margin of the film. The PICC line tip lies in the region of the midportion of the SVC.  IMPRESSION: There has been slight interval improvement in the appearance of the pulmonary interstitium and pulmonary vascularity which may reflect resolving interstitial edema. There is no alveolar pneumonia. Left basilar atelectasis is suspected but is less conspicuous.   Electronically Signed   By: David  Martinique   On: 03/31/2013 08:02  Dg Chest Port 1 View  03/30/2013   CLINICAL DATA:  Endotracheal tube position  EXAM: PORTABLE CHEST - 1 VIEW  COMPARISON:  Prior radiograph from 03/26/2013  FINDINGS: Endotracheal tube is in place with tip positioned 2.6 cm above the carina. Right-sided PICC catheter is again seen, unchanged. Enteric tube courses into the abdomen. Cardiomegaly is unchanged.  Lungs are hypoinflated. Patchy opacities within the right lung base are slightly worsened as compared to the prior exam. Patchy left basilar opacities are not significantly changed. Small bilateral pleural effusions are suspected, right greater than left. No pneumothorax. No overt pulmonary edema.  Osseous structures are unchanged.  IMPRESSION: 1. Tip of the endotracheal tube 2.6 cm above the carina. Stable position of additional support apparatus. 2. Slight  interval worsening of patchy right basilar opacity with similar left basilar opacity. Findings may reflect atelectasis or possibly infiltrates. 3. Probable small bilateral pleural effusions.   Electronically Signed   By: Jeannine Boga M.D.   On: 03/30/2013 06:01   Dg Abd Portable 1v  03/30/2013   CLINICAL DATA:  Abdominal distension  EXAM: PORTABLE ABDOMEN - 1 VIEW  COMPARISON:  CT ABD/PELV WO CM dated 03/27/2013  FINDINGS: There is a nasogastric tube with the tip within the stomach. There is gaseous distention of multiple loops of small bowel. There is oral contrast material seen throughout the colon. There is colonic diverticulosis. There is no extraluminal contrast. There is no pneumoperitoneum, pneumatosis or portal venous gas. There are no pathologic calcifications. There are surgical staples along the midline of the lower abdomen. The osseous structures are unremarkable.  IMPRESSION: 1. Gaseous distention of multiple loops of small bowel with oral contrast seen throughout the colon. This may reflect a mild ileus versus partial small bowel obstruction. .   Electronically Signed   By: Kathreen Devoid   On: 03/30/2013 10:53    Anti-infectives: Anti-infectives   Start     Dose/Rate Route Frequency Ordered Stop   03/24/13 0800  ciprofloxacin (CIPRO) IVPB 400 mg     400 mg 200 mL/hr over 60 Minutes Intravenous Every 24 hours 03/23/13 1241     03/23/13 1700  metroNIDAZOLE (FLAGYL) IVPB 500 mg     500 mg 100 mL/hr over 60 Minutes Intravenous Every 8 hours 03/23/13 1236     03/23/13 0730  [MAR Hold]  ciprofloxacin (CIPRO) IVPB 400 mg     (On MAR Hold since 03/23/13 0805)   400 mg 200 mL/hr over 60 Minutes Intravenous On call to O.R. 03/23/13 0728 03/23/13 0824   03/23/13 0730  [MAR Hold]  metroNIDAZOLE (FLAGYL) IVPB 500 mg     (On MAR Hold since 03/23/13 0805)   500 mg 100 mL/hr over 60 Minutes Intravenous On call to O.R. 03/23/13 2774 03/23/13 0910       Assessment/Plan   1. POD 8 s/p ex lap  with SBR for ischemic bowel  2. VDRF, required reintubation 03-27-13  3. ARF  4. PCM/TNA  5. Hypokalemia 6. Anemia  Plan: 1. Plan is for CCM to attempt extubation likely today.  Patient has said if she requires reintubation she does want a trach and go that route.  She is passing some flatus and contrast is in her colon.  Will try to hold off on NGT if extubates, but may need it if she bloats up. 2. Replace K 3. Cont TNA for now, hopefully can try something enterally soon. 4. Transfuse for hgb < 7.0  LOS: 9 days  Machell Wirthlin E 03/31/2013, 8:58 AM Pager: [phone]

## 2013-03-31 NOTE — Progress Notes (Signed)
Name: Kristi Alexander MRN: [id number] DOB: November 09, 1925    ADMISSION DATE:  03/22/2013 CONSULTATION DATE:  1/28  REFERRING MD :  Dr Hulen Skains PRIMARY SERVICE: [facility]  CHIEF COMPLAINT:  Vent and medical management   BRIEF PATIENT DESCRIPTION: 80 yo female with hx HTN admitted 1/27 with SBO.  Initially managed conservatively but cont to have worsening abd pain and increased WBC and was taken to OR 1/28 for exp lap and bowel resection.  Remained on vent post op and PCCM consulted.   SIGNIFICANT EVENTS / STUDIES:  1/27 admitted with SBO, conservative management  1/28 necrotic bowel suspected, to OR, 3-4 feet frankly necrotic bowel removed (no perforation)  2/01 Re-intubated; CT abd >> possible adhesions causing obstruction of SB, colon thickening ?infection or ischemia  LINES / TUBES: ETT 1/28>>>1/29; 2/1>>> L brachial aline 1/28>>> Rt PICC 2/1 >>  CULTURES: Urine 1/28>>> negative  ANTIBIOTICS: Flagyl 1/28>>> Cipro 1/29 >>   SUBJECTIVE:  Afebrile Good UO, off lasix Denies pain  VITAL SIGNS: Temp:  [98.3 F (36.8 C)-98.7 F (37.1 C)] 98.7 F (37.1 C) (02/05 0800) Pulse Rate:  [77-92] 92 (02/05 0800) Resp:  [9-39] 29 (02/05 0800) BP: (94-151)/(41-64) 144/56 mmHg (02/05 0800) SpO2:  [99 %-100 %] 100 % (02/05 0800) FiO2 (%):  [40 %] 40 % (02/05 0750) Weight:  [72.9 kg (160 lb 11.5 oz)] 72.9 kg (160 lb 11.5 oz) (02/05 0400) VENTILATOR SETTINGS: Vent Mode:  [-] CPAP;PSV FiO2 (%):  [40 %] 40 % Set Rate:  [18 bmp] 18 bmp Vt Set:  [400 mL] 400 mL PEEP:  [5 cmH20] 5 cmH20 Pressure Support:  [5 cmH20] 5 cmH20 Plateau Pressure:  [18 cmH20-20 cmH20] 18 cmH20 INTAKE / OUTPUT: Intake/Output     02/04 0701 - 02/05 0700 02/05 0701 - 02/06 0700   I.V. (mL/kg) 500 (6.9)    NG/GT 30    IV Piggyback 450    TPN 1700    Total Intake(mL/kg) 2680 (36.8)    Urine (mL/kg/hr) 1700 (1)    Emesis/NG output 600 (0.3)    Total Output 2300     Net +380            PHYSICAL  EXAMINATION: General:  No distress Neuro:  Awake, follows commands, RASS 0 HEENT: ETT in place Cardiovascular:  s1s2 rrr Lungs: b/l rhonchi, basilar rales Abdomen:  Soft, mild tenderness RLQWound dressing clean Musculoskeletal:  1+ edema  LABS:  CBC  Recent Labs Lab 03/29/13 0500 03/30/13 0355 03/31/13 0405  WBC 14.1* 13.2* 13.2*  HGB 7.5* 7.0* 7.0*  HCT 22.1* 20.7* 20.9*  PLT 201 225 284   Coag's  Recent Labs Lab 03/28/13 0330  APTT 33  INR 1.27   BMET  Recent Labs Lab 03/30/13 0355 03/30/13 1600 03/30/13 1753 03/30/13 1815 03/31/13 0405  NA 144 141  --   --  146  K 3.1* 4.3  --   --  3.5*  CL 106 106  --   --  108  CO2 26 24  --   --  27  BUN 74* 67*  --   --  70*  CREATININE 2.73* 2.25*  --   --  2.25*  GLUCOSE 170* 550* 515* 122* 174*   Electrolytes  Recent Labs Lab 03/29/13 0500 03/30/13 0355 03/30/13 1600 03/31/13 0405  CALCIUM 7.8* 8.0* 7.6* 8.0*  MG 1.5 1.9  --  2.1  PHOS 3.2 3.2  --  3.3   Sepsis Markers  Recent Labs Lab 03/27/13 [phone]  03/28/13 0355  LATICACIDVEN 1.1 1.3   ABG  Recent Labs Lab 03/27/13 1220 03/28/13 0420 03/30/13 1309  PHART 7.329* 7.311* 7.410  PCO2ART 30.3* 35.5 44.3  PO2ART 63.0* 91.1 134.0*   Liver Enzymes  Recent Labs Lab 03/27/13 0351 03/28/13 0330 03/31/13 0405  AST 61* 38* 19  ALT 45* 32 14  ALKPHOS 101 80 75  BILITOT 0.6 0.6 0.4  ALBUMIN 1.9* 1.7* 1.7*   Glucose  Recent Labs Lab 03/30/13 1145 03/30/13 1511 03/30/13 1941 03/30/13 2348 03/31/13 0325 03/31/13 0711  GLUCAP 208* 156* 116* 185* 166* 134*    Imaging Dg Chest Port 1 View  03/31/2013   CLINICAL DATA:  Status post small bowel resection  EXAM: PORTABLE CHEST - 1 VIEW  COMPARISON:  DG CHEST 1V PORT dated 03/30/2013  FINDINGS: The lungs are reasonably well inflated. The endotracheal tube tip lies approximately 2.5 cm above the crotch of the carina. The interstitial markings of the lungs remain increased but are not  significantly changed. The left hemidiaphragm remains obscured. The right hemidiaphragm is better demonstrated today. The cardiac silhouette remains mildly enlarged. The pulmonary vascularity is less prominent today.  The esophagogastric tube tip and proximal port project below the inferior margin of the film. The PICC line tip lies in the region of the midportion of the SVC.  IMPRESSION: There has been slight interval improvement in the appearance of the pulmonary interstitium and pulmonary vascularity which may reflect resolving interstitial edema. There is no alveolar pneumonia. Left basilar atelectasis is suspected but is less conspicuous.   Electronically Signed   By: David  Martinique   On: 03/31/2013 08:02   Dg Chest Port 1 View  03/30/2013   CLINICAL DATA:  Endotracheal tube position  EXAM: PORTABLE CHEST - 1 VIEW  COMPARISON:  Prior radiograph from 03/26/2013  FINDINGS: Endotracheal tube is in place with tip positioned 2.6 cm above the carina. Right-sided PICC catheter is again seen, unchanged. Enteric tube courses into the abdomen. Cardiomegaly is unchanged.  Lungs are hypoinflated. Patchy opacities within the right lung base are slightly worsened as compared to the prior exam. Patchy left basilar opacities are not significantly changed. Small bilateral pleural effusions are suspected, right greater than left. No pneumothorax. No overt pulmonary edema.  Osseous structures are unchanged.  IMPRESSION: 1. Tip of the endotracheal tube 2.6 cm above the carina. Stable position of additional support apparatus. 2. Slight interval worsening of patchy right basilar opacity with similar left basilar opacity. Findings may reflect atelectasis or possibly infiltrates. 3. Probable small bilateral pleural effusions.   Electronically Signed   By: Jeannine Boga M.D.   On: 03/30/2013 06:01   Dg Abd Portable 1v  03/30/2013   CLINICAL DATA:  Abdominal distension  EXAM: PORTABLE ABDOMEN - 1 VIEW  COMPARISON:  CT ABD/PELV  WO CM dated 03/27/2013  FINDINGS: There is a nasogastric tube with the tip within the stomach. There is gaseous distention of multiple loops of small bowel. There is oral contrast material seen throughout the colon. There is colonic diverticulosis. There is no extraluminal contrast. There is no pneumoperitoneum, pneumatosis or portal venous gas. There are no pathologic calcifications. There are surgical staples along the midline of the lower abdomen. The osseous structures are unremarkable.  IMPRESSION: 1. Gaseous distention of multiple loops of small bowel with oral contrast seen throughout the colon. This may reflect a mild ileus versus partial small bowel obstruction. .   Electronically Signed   By: Kathreen Devoid   On:  03/30/2013 10:53   ASSESSMENT / PLAN:  PULMONARY A: Acute respiratory failure in setting of ischemic bowel and sepsis.  Re-intubated 2/1. Pleural effusions. P:   -SBTs -tolerates 5/5 -Trial of extubation, does desire re-intubation if fails   CARDIOVASCULAR A: Severe sepsis >> resolved. Hx of HTN. P:  -continue scheduled IV lopressor  RENAL A: Acute kidney injury (baseline creatinine 0.9 from 03/22/13) -improving Non-anion gap metabolic acidosis. Lactic acidosis >> resolved. Hypervolemia. P:   -monitor renal fx urine outpt. Electrolytes -dc HCO3 gtt  - replete K, IV lasix x 1   GASTROINTESTINAL A: SBO from ischemic bowel >> CT abd findings from 2/01 noted. Protein calorie malnutrition P:   -post-op care, nutrition per CCS -protonix for SUP -Consider starting trickle feeds, contrast in colon, flatus +  HEMATOLOGIC A: Anemia of critical illness. P:  -transfuse 1U PRBC once Hb < 7 -lovenox for DVT prevention  INFECTIOUS A: Severe sepsis 2nd to ischemic bowel. P:   -continue cipro/flagyl per [facility]  ENDOCRINE A: Hyperglycemia. P:   -SSI -resistant scale  NEUROLOGIC A: Post op pain  P:   -PRN fentanyl.  Summary: Concerned that she will require  very long rehab course and is at risk for multiple complications during this process.  She was previously DNR status. Does desire reintubation & trach now if needed  CC time 35 minutes.   Kara Mead MD. [facility].  Pulmonary & Critical care Pager [phone] If no response call [phone]   03/31/2013, 9:22 AM

## 2013-04-01 ENCOUNTER — Inpatient Hospital Stay (HOSPITAL_COMMUNITY): Payer: Medicare Other

## 2013-04-01 ENCOUNTER — Encounter (HOSPITAL_COMMUNITY): Payer: Self-pay | Admitting: Internal Medicine

## 2013-04-01 ENCOUNTER — Encounter (HOSPITAL_COMMUNITY): Admission: EM | Disposition: A | Discharge status: Skilled Nursing Facility | Payer: Self-pay | Source: Home / Self Care

## 2013-04-01 DIAGNOSIS — Z930 Tracheostomy status: Secondary | ICD-10-CM

## 2013-04-01 HISTORY — PX: ESOPHAGOGASTRODUODENOSCOPY: SHX5428

## 2013-04-01 HISTORY — PX: PEG PLACEMENT: SHX5437

## 2013-04-01 LAB — BASIC METABOLIC PANEL WITH GFR
BUN: 72 mg/dL — ABNORMAL HIGH (ref 6–23)
CO2: 27 meq/L (ref 19–32)
Calcium: 8.6 mg/dL (ref 8.4–10.5)
Chloride: 108 meq/L (ref 96–112)
Creatinine, Ser: 1.98 mg/dL — ABNORMAL HIGH (ref 0.50–1.10)
GFR calc Af Amer: 25 mL/min — ABNORMAL LOW
GFR calc non Af Amer: 22 mL/min — ABNORMAL LOW
Glucose, Bld: 282 mg/dL — ABNORMAL HIGH (ref 70–99)
Potassium: 4.2 meq/L (ref 3.7–5.3)
Sodium: 147 meq/L (ref 137–147)

## 2013-04-01 LAB — CBC
HCT: 21.9 % — ABNORMAL LOW (ref 36.0–46.0)
HEMOGLOBIN: 7.2 g/dL — AB (ref 12.0–15.0)
MCH: 24.6 pg — ABNORMAL LOW (ref 26.0–34.0)
MCHC: 32.9 g/dL (ref 30.0–36.0)
MCV: 74.7 fL — AB (ref 78.0–100.0)
Platelets: 344 10*3/uL (ref 150–400)
RBC: 2.93 MIL/uL — AB (ref 3.87–5.11)
RDW: 20.4 % — ABNORMAL HIGH (ref 11.5–15.5)
WBC: 14.2 10*3/uL — AB (ref 4.0–10.5)

## 2013-04-01 LAB — GLUCOSE, CAPILLARY
GLUCOSE-CAPILLARY: 260 mg/dL — AB (ref 70–99)
GLUCOSE-CAPILLARY: 221 mg/dL — AB (ref 70–99)
GLUCOSE-CAPILLARY: 301 mg/dL — AB (ref 70–99)
GLUCOSE-CAPILLARY: 313 mg/dL — AB (ref 70–99)
Glucose-Capillary: 255 mg/dL — ABNORMAL HIGH (ref 70–99)
Glucose-Capillary: 281 mg/dL — ABNORMAL HIGH (ref 70–99)

## 2013-04-01 LAB — PROTIME-INR
INR: 1.02 (ref 0.00–1.49)
Prothrombin Time: 13.2 s (ref 11.6–15.2)

## 2013-04-01 LAB — APTT: aPTT: 31 s (ref 24–37)

## 2013-04-01 SURGERY — EGD (ESOPHAGOGASTRODUODENOSCOPY)
Anesthesia: Moderate Sedation

## 2013-04-01 MED ORDER — FENTANYL CITRATE 0.05 MG/ML IJ SOLN
25.0000 ug | INTRAMUSCULAR | Status: AC | PRN
  Administered 2013-04-01: 50 ug via INTRAVENOUS
  Administered 2013-04-02: 25 ug via INTRAVENOUS
  Filled 2013-04-01 (×2): qty 2

## 2013-04-01 MED ORDER — TRACE MINERALS CR-CU-F-FE-I-MN-MO-SE-ZN IV SOLN
INTRAVENOUS | Status: AC
  Administered 2013-04-01: 18:00:00 via INTRAVENOUS
  Filled 2013-04-01: qty 2000

## 2013-04-01 MED ORDER — FAT EMULSION 20 % IV EMUL
250.0000 mL | INTRAVENOUS | Status: AC
  Administered 2013-04-01: 250 mL via INTRAVENOUS
  Filled 2013-04-01: qty 250

## 2013-04-01 MED ORDER — PROPOFOL 10 MG/ML IV EMUL
INTRAVENOUS | Status: AC
  Filled 2013-04-01: qty 50

## 2013-04-01 MED ORDER — PROPOFOL 10 MG/ML IV EMUL
5.0000 ug/kg/min | Freq: Once | INTRAVENOUS | Status: AC
  Administered 2013-04-01: 50 ug/kg/min via INTRAVENOUS

## 2013-04-01 MED ORDER — INSULIN GLARGINE 100 UNIT/ML ~~LOC~~ SOLN
10.0000 [IU] | Freq: Every day | SUBCUTANEOUS | Status: DC
  Administered 2013-04-01 – 2013-04-03 (×3): 10 [IU] via SUBCUTANEOUS
  Filled 2013-04-01 (×3): qty 0.1

## 2013-04-01 MED ORDER — METHYLPREDNISOLONE SODIUM SUCC 125 MG IJ SOLR
80.0000 mg | Freq: Three times a day (TID) | INTRAMUSCULAR | Status: AC
  Administered 2013-04-01 – 2013-04-02 (×3): 80 mg via INTRAVENOUS
  Filled 2013-04-01 (×3): qty 1.28

## 2013-04-01 NOTE — Progress Notes (Signed)
Patient ID: Kristi Alexander, female   DOB: 1925/06/06, 80 y.o.   MRN: [id number] 9 Days Post-Op  Subjective: Pt got extubated yesterday and reintubated within 2 hours.  She is stable on vent right now.  Planning on trach this am.  D/w patient need for feeding access.  She is agreeable, as well as her daughter, to a g-tube.  She had 3 BMs so far since yesterday.  Objective: Vital signs in last 24 hours: Temp:  [98 F (36.7 C)-99.1 F (37.3 C)] 98.2 F (36.8 C) (02/06 0723) Pulse Rate:  [73-101] 76 (02/06 0746) Resp:  [14-29] 27 (02/06 0746) BP: (115-154)/(44-100) 147/49 mmHg (02/06 0746) SpO2:  [99 %-100 %] 100 % (02/06 0746) FiO2 (%):  [40 %] 40 % (02/06 0746) Last BM Date: 03/31/13  Intake/Output from previous day: 02/05 0701 - 02/06 0700 In: 2808 [I.V.:433; NG/GT:30; IV Piggyback:750; TPN:1595] Out: 3500 [Urine:3300; Emesis/NG output:200] Intake/Output this shift:    PE: Abd: soft, stable tenderness, +BS, ND, incision c/d/iw with staples  Lab Results:   Recent Labs  03/31/13 0405 04/01/13 0415  WBC 13.2* 14.2*  HGB 7.0* 7.2*  HCT 20.9* 21.9*  PLT 284 344   BMET  Recent Labs  03/31/13 0405 04/01/13 0415  NA 146 147  K 3.5* 4.2  CL 108 108  CO2 27 27  GLUCOSE 174* 282*  BUN 70* 72*  CREATININE 2.25* 1.98*  CALCIUM 8.0* 8.6   PT/INR  Recent Labs  04/01/13 0415  LABPROT 13.2  INR 1.02   CMP     Component Value Date/Time   NA 147 04/01/2013 0415   K 4.2 04/01/2013 0415   CL 108 04/01/2013 0415   CO2 27 04/01/2013 0415   GLUCOSE 282* 04/01/2013 0415   BUN 72* 04/01/2013 0415   CREATININE 1.98* 04/01/2013 0415   CALCIUM 8.6 04/01/2013 0415   PROT 4.9* 03/31/2013 0405   ALBUMIN 1.7* 03/31/2013 0405   AST 19 03/31/2013 0405   ALT 14 03/31/2013 0405   ALKPHOS 75 03/31/2013 0405   BILITOT 0.4 03/31/2013 0405   GFRNONAA 22* 04/01/2013 0415   GFRAA 25* 04/01/2013 0415   Lipase  No results found for this basename: lipase       Studies/Results: Portable Chest Xray In  Am  04/01/2013   CLINICAL DATA:  Interstitial edema.  EXAM: PORTABLE CHEST - 1 VIEW  COMPARISON:  DG CHEST 1V PORT dated 03/31/2013  FINDINGS: Endotracheal and NG tube in stable position. Central line in stable position. Stable cardiomegaly and pulmonary vascular prominence. Progressive bilateral pulmonary edema with bilateral pleural effusions noted. Findings consistent with congestive heart failure . No pneumothorax.  IMPRESSION: 1. Line and tube position stable. 2. Progressive congestive heart failure with pulmonary edema and small bilateral pleural effusions. Superimposed bibasilar pneumonia cannot be excluded.   Electronically Signed   By: Marcello Moores  Register   On: 04/01/2013 07:29   Portable Chest Xray  03/31/2013   CLINICAL DATA:  Endotracheal tube placement  EXAM: PORTABLE CHEST - 1 VIEW  COMPARISON:  February 5th 2015 5:45 a.m.  FINDINGS: The heart size and mediastinal contours are stable. The heart size is enlarged. Endotracheal tube is identified with distal tip 3.8 cm from carina. Right subclavian central venous line is unchanged. There is mild diffuse increased pulmonary interstitium bilaterally. There are probable minimal bilateral pleural effusions. The visualized skeletal structures are stable.  IMPRESSION: Endotracheal tube distal tip 3.8 cm from carina. There is no pneumothorax. Interstitial pulmonary edema.   Electronically  Signed   By: Abelardo Diesel M.D.   On: 03/31/2013 13:28   Dg Chest Port 1 View  03/31/2013   CLINICAL DATA:  Status post small bowel resection  EXAM: PORTABLE CHEST - 1 VIEW  COMPARISON:  DG CHEST 1V PORT dated 03/30/2013  FINDINGS: The lungs are reasonably well inflated. The endotracheal tube tip lies approximately 2.5 cm above the crotch of the carina. The interstitial markings of the lungs remain increased but are not significantly changed. The left hemidiaphragm remains obscured. The right hemidiaphragm is better demonstrated today. The cardiac silhouette remains mildly  enlarged. The pulmonary vascularity is less prominent today.  The esophagogastric tube tip and proximal port project below the inferior margin of the film. The PICC line tip lies in the region of the midportion of the SVC.  IMPRESSION: There has been slight interval improvement in the appearance of the pulmonary interstitium and pulmonary vascularity which may reflect resolving interstitial edema. There is no alveolar pneumonia. Left basilar atelectasis is suspected but is less conspicuous.   Electronically Signed   By: David  Martinique   On: 03/31/2013 08:02   Dg Abd Portable 1v  03/30/2013   CLINICAL DATA:  Abdominal distension  EXAM: PORTABLE ABDOMEN - 1 VIEW  COMPARISON:  CT ABD/PELV WO CM dated 03/27/2013  FINDINGS: There is a nasogastric tube with the tip within the stomach. There is gaseous distention of multiple loops of small bowel. There is oral contrast material seen throughout the colon. There is colonic diverticulosis. There is no extraluminal contrast. There is no pneumoperitoneum, pneumatosis or portal venous gas. There are no pathologic calcifications. There are surgical staples along the midline of the lower abdomen. The osseous structures are unremarkable.  IMPRESSION: 1. Gaseous distention of multiple loops of small bowel with oral contrast seen throughout the colon. This may reflect a mild ileus versus partial small bowel obstruction. .   Electronically Signed   By: Kathreen Devoid   On: 03/30/2013 10:53    Anti-infectives: Anti-infectives   Start     Dose/Rate Route Frequency Ordered Stop   03/24/13 0800  ciprofloxacin (CIPRO) IVPB 400 mg     400 mg 200 mL/hr over 60 Minutes Intravenous Every 24 hours 03/23/13 1241     03/23/13 1700  metroNIDAZOLE (FLAGYL) IVPB 500 mg     500 mg 100 mL/hr over 60 Minutes Intravenous Every 8 hours 03/23/13 1236     03/23/13 0730  [MAR Hold]  ciprofloxacin (CIPRO) IVPB 400 mg     (On MAR Hold since 03/23/13 0805)   400 mg 200 mL/hr over 60 Minutes  Intravenous On call to O.R. 03/23/13 0728 03/23/13 0824   03/23/13 0730  [MAR Hold]  metroNIDAZOLE (FLAGYL) IVPB 500 mg     (On MAR Hold since 03/23/13 0805)   500 mg 100 mL/hr over 60 Minutes Intravenous On call to O.R. 03/23/13 7353 03/23/13 0910       Assessment/Plan 1. POD 9 s/p ex lap with SBR for ischemic bowel  2. VDRF, required reintubation 03-27-13 and 03-31-13 3. ARF, improving 4. PCM/TNA  5. Hypokalemia, resolved  6. Anemia, stable  Plan: 1. Plan for trach today.  I have discussed feeding access with the patient.  She and her daughter are agreeable to a g-tube.  I will d/w Dr. Hulen Skains to see if he or Dr. Grandville Silos can place a PEG tube, if not, we will see if IR can place one so we can start enteral TFs and get her off  of TNA.  If no one is able to place a g-tube, then she will have to have a PANDA 2. hgb stable.  No transfusion needed 3. Greatly appreciate CCM assistance with this patient.     LOS: 10 days    Jonni Oelkers E 04/01/2013, 7:55 AM Pager: [phone]

## 2013-04-01 NOTE — Care Management Note (Signed)
Page 1 of 2   04/13/2013     2:31:47 PM   CARE MANAGEMENT NOTE 04/13/2013  Patient:  Kristi Alexander, Kristi Alexander   Account Number:  [id number]  Date Initiated:  03/23/2013  Documentation initiated by:  [facility]  Subjective/Objective Assessment:   ADmitted with abd pain - SBO - treated conservatively but continued to deteriorate.  To surgery 1-28 with removal of necrotic bowel. Post op on vent in ICU.     Action/Plan:   Anticipated DC Date:  04/12/2013   Anticipated DC Plan:  SKILLED NURSING FACILITY  In-house referral  Clinical Social Worker      DC Planning Services  CM consult      Choice offered to / List presented to:             Status of service:  In process, will continue to follow Medicare Important Message given?   (If response is "NO", the following Medicare IM given date fields will be blank) Date Medicare IM given:   Date Additional Medicare IM given:    Discharge Disposition:    Per UR Regulation:  Reviewed for med. necessity/level of care/duration of stay  If discussed at [place] of Stay Meetings, dates discussed:   03/29/2013  03/31/2013  04/05/2013  04/07/2013  04/12/2013    Comments:  Contact:  Rebman,Curtis Spouse [phone]                 Carelock,Denise Daughter [phone]  04-13-13 10:30am Luz Lex, RNBSN [phone] Not eligible for Ltach but  trying to get into SNF at [facility] that does take vents.  SW working on.  CM will continue to follow.  Talked with daughter, [name] about discharge options. Aware that she is not an Ltach candidate.  At this time remains on the vent - acutually on the vent more now. Discussed home with a ventilator.  Daughter states this is not possible with her father there that they care for now. States this would be too hard on her Dad and would not do that to him.  Husband apparently does not know that she is trached and vented and are hoping that she will not have these when she comes home.   Discussed  Vent/SNF facilities - one being [facility].  Daughter said absolutely not [facility].  I talked with daughter about [facility]  Daughter realistic and stated she didn't like that but would be ok with this rather than [facility] if this is what her mother would need.  PCCM physician feels patient will wean, better now that she is up walking in halls and feels that Vent/SNF would not wean her.  Would like to work on weaning more as still feels can get her off the vent.  Plan at this time for tx to Vent/SDU.  Updated pateint and daughter of plan to move to SDU and continue trying to wean off vent as she is more mobile now.  04-11-13 [place], RNBSN [phone] Still continues to be on vent at night.  On vent from 4pm 2-15 to 8am 2-16. Total of 16 hours.  At this time Insurance saying will not be an Ltach candidate.  SW on board for SNF placedment for rehab - determining factor will be if needs vent or just trach collar.  04-05-13 3:15pm Luz Lex, RNBSN [phone] Besides 21 days on vent will also need to fail weaning x3 with documentation - and be trached for  7 days. 21 days is up 2-17 7days trached will be 04-08-13 Will need documentation of 3 failed weaning.  04/01/13 JULIE AMERSON,RN,BSN [phone] LTAC CONSULT RECEIVED AND FAMILY REQUEST [facility].  TOMMY WITH [facility] HAS CHECKED BENEFITS, AND PT WILL HAVE TO BE INPT 21 DAYS PRIOR TO DC TO LTAC.  NOTIFIED DR WAKEFIELD OF THIS. CSW STANDING BY SHOULD PT BE ABLE TO DC TO SNF. TRACH PLACED TODAY.  PT REMAINS ON TPN.  03-30-13 9:30am [place] [phone] 2-1 reintubated - daughter felt Mom would not want trach so plan was for revisit in 48 hours and maybe one way extubation.  Today patient alert - want to try trach and weaning.  CM will continue to follow.  Asked [facility] to check Ltach benefits.

## 2013-04-01 NOTE — Procedures (Signed)
Perc trach See full dictation Blood loss less 5 cc Size 6 placed  Lavon Paganini. [name], MD, [place] Pgr: [place] [facility]

## 2013-04-01 NOTE — Plan of Care (Signed)
Problem: Phase I Progression Outcomes Goal: Tracheostomy by Vent Day 14 Outcome: Completed/Met Date Met:  04/01/13 Trached 04/01/13 at bedside by Dr. Elsworth Soho and Dr. [name]. Goal: Initial discharge plan identified Outcome: Progressing Planning for LTAC at present.

## 2013-04-01 NOTE — Op Note (Signed)
OPERATIVE REPORT  DATE OF OPERATION: 03/22/2013 - 03/23/2013  PATIENT:  Kristi Alexander  80 y.o. female  PRE-OPERATIVE DIAGNOSIS:  Ventilator dependent respiratory failure  POST-OPERATIVE DIAGNOSIS:  Same with malnutrition  PROCEDURE:  Procedure(s): Percutaneous endoscopic gastrostomy tube SURGEON:  Surgeon(s): Gwenyth Ober, MD  ASSISTANT: [place], NP-C  ANESTHESIA:   IV sedation and local anesthetic  EBL: <10 ml  BLOOD ADMINISTERED: none  DRAINS: Urinary Catheter (Foley) and Gastrostomy Tube   SPECIMEN:  No Specimen  COUNTS CORRECT:  YES  PROCEDURE DETAILS: The procedure was performed at the patient's bedside in the intensive care unit. After proper time out was performed identifying the patient and the procedure to be performed, a Pentax 2990 endoscope was used to cannulate the patient's oropharynx then subsequently the esophagus with orogastric tube in place. With down into the stomach and subsequently removed the OG tube  We passed the endoscope down to and through the pylorus. Pictures were taken of the duodenum and the pylorus. We subsequently retracted the scope back into the stomach where we could see the impulse from the assistance of finger on the anterior abdominal wall. A site was chosen for the passage of a gastrostomy tube. After anesthetizing the anterior abdominal wall an incision was made allowing the passage of an Angiocath into the stomach under direct vision. After the needle was removed from the [place] a looped the blue wire was passed through the Angiocath into the stomach which was subsequently attached to the pull-through gastrostomy tube. We subsequently pulled the gastrostomy tube back into position with a bolster at the anterior gastric wall. It was secured in place in the usual manner. We were able to cannulate the stomach again and take pictures of the gastrostomy tube in proper position.  All needle counts sponge counts and instrument counts were  correct.  PATIENT DISPOSITION:  Remained in ICU , procedure done at the bedside.   Gwenyth Ober 2/6/20159:44 AM

## 2013-04-01 NOTE — Significant Event (Signed)
CBG (last 3)   Recent Labs  04/01/13 0725 04/01/13 1204 04/01/13 1534  GLUCAP 260* 221* 281*    Pt on TNA and getting solumedrol.  Will add 10 units Lantus.  Chesley Mires, MD 04/01/2013, 5:30 PM

## 2013-04-01 NOTE — Progress Notes (Signed)
Inpatient Diabetes Program Recommendations  AACE/ADA: New Consensus Statement on Inpatient Glycemic Control (2013)  Target Ranges:  Prepandial:   less than 140 mg/dL      Peak postprandial:   less than 180 mg/dL (1-2 hours)      Critically ill patients:  140 - 180 mg/dL   Reason for Visit: Results for Kristi Alexander, Kristi Alexander (MRN [id number]) as of 04/01/2013 13:42  Ref. Range 03/31/2013 19:51 03/31/2013 23:19 04/01/2013 04:01 04/01/2013 07:25 04/01/2013 12:04  Glucose-Capillary Latest Range: 70-99 mg/dL 251 (H) 198 (H) 255 (H) 260 (H) 221 (H)   Diabetes history: None noted, Patient is on TPN. Current orders for Inpatient glycemic control: Resistant Novolog q 4 hours  If appropriate, may consider ICU Glycemic control protocol.  If IV insulin not indicated, may consider adding Lantus 10 units daily.  Adah Perl, RN, BC-ADM Inpatient Diabetes Coordinator Pager [phone]

## 2013-04-01 NOTE — Progress Notes (Signed)
Name: Kristi Alexander MRN: [id number] DOB: 1925/03/12    ADMISSION DATE:  03/22/2013 CONSULTATION DATE:  1/28  REFERRING MD :  Dr Hulen Skains PRIMARY SERVICE: [facility]  CHIEF COMPLAINT:  Vent and medical management   BRIEF PATIENT DESCRIPTION: 80 yo female with hx HTN admitted 1/27 with SBO.  Initially managed conservatively but cont to have worsening abd pain and increased WBC and was taken to OR 1/28 for exp lap and bowel resection.  Remained on vent post op and PCCM consulted.   SIGNIFICANT EVENTS / STUDIES:  1/27 admitted with SBO, conservative management  1/28 necrotic bowel suspected, to OR, 3-4 feet frankly necrotic bowel removed (no perforation)  2/01 Re-intubated; CT abd >> possible adhesions causing obstruction of SB, colon thickening ?infection or ischemia  LINES / TUBES: ETT 1/28>>>1/29; 2/1>>> 2/5, 2/5 >> L brachial aline 1/28>>> out Rt PICC 2/1 >>  CULTURES: Urine 1/28>>> negative  ANTIBIOTICS: Flagyl 1/28>>> Cipro 1/29 >>   SUBJECTIVE:  Afebrile Good UO with lasix lasix Denies pain  VITAL SIGNS: Temp:  [98 F (36.7 C)-99.1 F (37.3 C)] 98.2 F (36.8 C) (02/06 0723) Pulse Rate:  [73-101] 76 (02/06 0746) Resp:  [14-29] 27 (02/06 0746) BP: (115-154)/(44-100) 147/49 mmHg (02/06 0746) SpO2:  [99 %-100 %] 100 % (02/06 0746) FiO2 (%):  [40 %] 40 % (02/06 0746) VENTILATOR SETTINGS: Vent Mode:  [-] PRVC FiO2 (%):  [40 %] 40 % Set Rate:  [15 bmp] 15 bmp Vt Set:  [400 mL] 400 mL PEEP:  [5 cmH20] 5 cmH20 Plateau Pressure:  [14 cmH20-18 cmH20] 17 cmH20 INTAKE / OUTPUT: Intake/Output     02/05 0701 - 02/06 0700 02/06 0701 - 02/07 0700   I.V. (mL/kg) 433 (5.9)    NG/GT 30    IV Piggyback 750    TPN 1595 65   Total Intake(mL/kg) 2808 (38.5) 65 (0.9)   Urine (mL/kg/hr) 3400 (1.9)    Emesis/NG output 200 (0.1)    Total Output 3600     Net -792 +65        Stool Occurrence 2 x      PHYSICAL EXAMINATION: General:  No distress Neuro:  Awake, follows commands,  RASS 0 HEENT: ETT in place Cardiovascular:  s1s2 rrr Lungs: b/l rhonchi, basilar rales Abdomen:  Soft, mild tenderness RLQWound dressing clean Musculoskeletal:  1+ edema  LABS:  CBC  Recent Labs Lab 03/30/13 0355 03/31/13 0405 04/01/13 0415  WBC 13.2* 13.2* 14.2*  HGB 7.0* 7.0* 7.2*  HCT 20.7* 20.9* 21.9*  PLT 225 284 344   Coag's  Recent Labs Lab 03/28/13 0330 04/01/13 0415  APTT 33 31  INR 1.27 1.02   BMET  Recent Labs Lab 03/30/13 1600  03/30/13 1815 03/31/13 0405 04/01/13 0415  NA 141  --   --  146 147  K 4.3  --   --  3.5* 4.2  CL 106  --   --  108 108  CO2 24  --   --  27 27  BUN 67*  --   --  70* 72*  CREATININE 2.25*  --   --  2.25* 1.98*  GLUCOSE 550*  < > 122* 174* 282*  < > = values in this interval not displayed. Electrolytes  Recent Labs Lab 03/29/13 0500 03/30/13 0355 03/30/13 1600 03/31/13 0405 04/01/13 0415  CALCIUM 7.8* 8.0* 7.6* 8.0* 8.6  MG 1.5 1.9  --  2.1  --   PHOS 3.2 3.2  --  3.3  --  Sepsis Markers  Recent Labs Lab 03/27/13 0939 03/28/13 0355  LATICACIDVEN 1.1 1.3   ABG  Recent Labs Lab 03/28/13 0420 03/30/13 1309 03/31/13 1440  PHART 7.311* 7.410 7.426  PCO2ART 35.5 44.3 41.5  PO2ART 91.1 134.0* 75.0*   Liver Enzymes  Recent Labs Lab 03/27/13 0351 03/28/13 0330 03/31/13 0405  AST 61* 38* 19  ALT 45* 32 14  ALKPHOS 101 80 75  BILITOT 0.6 0.6 0.4  ALBUMIN 1.9* 1.7* 1.7*   Glucose  Recent Labs Lab 03/31/13 1110 03/31/13 1542 03/31/13 1951 03/31/13 2319 04/01/13 0401 04/01/13 0725  GLUCAP 169* 197* 251* 198* 255* 260*    Imaging Portable Chest Xray In Am  04/01/2013   CLINICAL DATA:  Interstitial edema.  EXAM: PORTABLE CHEST - 1 VIEW  COMPARISON:  DG CHEST 1V PORT dated 03/31/2013  FINDINGS: Endotracheal and NG tube in stable position. Central line in stable position. Stable cardiomegaly and pulmonary vascular prominence. Progressive bilateral pulmonary edema with bilateral pleural effusions  noted. Findings consistent with congestive heart failure . No pneumothorax.  IMPRESSION: 1. Line and tube position stable. 2. Progressive congestive heart failure with pulmonary edema and small bilateral pleural effusions. Superimposed bibasilar pneumonia cannot be excluded.   Electronically Signed   By: Marcello Moores  Register   On: 04/01/2013 07:29   Portable Chest Xray  03/31/2013   CLINICAL DATA:  Endotracheal tube placement  EXAM: PORTABLE CHEST - 1 VIEW  COMPARISON:  February 5th 2015 5:45 a.m.  FINDINGS: The heart size and mediastinal contours are stable. The heart size is enlarged. Endotracheal tube is identified with distal tip 3.8 cm from carina. Right subclavian central venous line is unchanged. There is mild diffuse increased pulmonary interstitium bilaterally. There are probable minimal bilateral pleural effusions. The visualized skeletal structures are stable.  IMPRESSION: Endotracheal tube distal tip 3.8 cm from carina. There is no pneumothorax. Interstitial pulmonary edema.   Electronically Signed   By: Abelardo Diesel M.D.   On: 03/31/2013 13:28   Dg Chest Port 1 View  03/31/2013   CLINICAL DATA:  Status post small bowel resection  EXAM: PORTABLE CHEST - 1 VIEW  COMPARISON:  DG CHEST 1V PORT dated 03/30/2013  FINDINGS: The lungs are reasonably well inflated. The endotracheal tube tip lies approximately 2.5 cm above the crotch of the carina. The interstitial markings of the lungs remain increased but are not significantly changed. The left hemidiaphragm remains obscured. The right hemidiaphragm is better demonstrated today. The cardiac silhouette remains mildly enlarged. The pulmonary vascularity is less prominent today.  The esophagogastric tube tip and proximal port project below the inferior margin of the film. The PICC line tip lies in the region of the midportion of the SVC.  IMPRESSION: There has been slight interval improvement in the appearance of the pulmonary interstitium and pulmonary vascularity  which may reflect resolving interstitial edema. There is no alveolar pneumonia. Left basilar atelectasis is suspected but is less conspicuous.   Electronically Signed   By: David  [place]   On: 03/31/2013 08:02   Dg Abd Portable 1v  03/30/2013   CLINICAL DATA:  Abdominal distension  EXAM: PORTABLE ABDOMEN - 1 VIEW  COMPARISON:  CT ABD/PELV WO CM dated 03/27/2013  FINDINGS: There is a nasogastric tube with the tip within the stomach. There is gaseous distention of multiple loops of small bowel. There is oral contrast material seen throughout the colon. There is colonic diverticulosis. There is no extraluminal contrast. There is no pneumoperitoneum, pneumatosis or portal venous gas.  There are no pathologic calcifications. There are surgical staples along the midline of the lower abdomen. The osseous structures are unremarkable.  IMPRESSION: 1. Gaseous distention of multiple loops of small bowel with oral contrast seen throughout the colon. This may reflect a mild ileus versus partial small bowel obstruction. .   Electronically Signed   By: Kathreen Devoid   On: 03/30/2013 10:53   ASSESSMENT / PLAN:  PULMONARY A: Acute respiratory failure in setting of ischemic bowel and sepsis.  Re-intubated 2/1. Pleural effusions. P:   -Trach today -solumedrol 80 q 8 x 6 doses   CARDIOVASCULAR A: Severe sepsis >> resolved. Hx of HTN. P:  -continue scheduled IV lopressor  RENAL A: Acute kidney injury (baseline creatinine 0.9 from 03/22/13) -improving Non-anion gap metabolic acidosis. Lactic acidosis >> resolved. Hypervolemia. P:   -monitor renal fx urine outpt. Electrolytes   GASTROINTESTINAL A: SBO from ischemic bowel >> CT abd findings from 2/01 noted. Protein calorie malnutrition P:   -post-op care, nutrition per CCS, would expect to be able to swallow once off vent post trach -protonix for SUP   HEMATOLOGIC A: Anemia of critical illness. P:  -transfuse 1U PRBC once Hb < 7 -lovenox for DVT  prevention  INFECTIOUS A: Severe sepsis 2nd to ischemic bowel. P:   -continue cipro/flagyl per [facility]  ENDOCRINE A: Hyperglycemia. P:   -SSI -resistant scale, sugars high since lantus held  NEUROLOGIC A: Post op pain  P:   -PRN fentanyl.  Summary: Concerned that she will require very long rehab course and is at risk for multiple complications during this process.  She was previously DNR status. Proceed with trach, can plan for LTAC eventually  CC time 35 minutes.   Kara Mead MD. [place]. [place] [facility] Pager [phone] If no response call [phone]   04/01/2013, 8:32 AM

## 2013-04-01 NOTE — Progress Notes (Signed)
CSW went by room to speak to patient. Patient sleeping. CSW will try again later today and call family.  Jeanette Caprice, MSW, [place]

## 2013-04-01 NOTE — Procedures (Signed)
Bronchoscopy Procedure Note Kristi Alexander [id number] 29-Jul-1925  Procedure: Bronchoscopy Indications: Diagnostic evaluation of the airways, visualisation for perc trach  Procedure Details Consent: Risks of procedure as well as the alternatives and risks of each were explained to the (patient/caregiver).  Consent for procedure obtained. Time Out: Verified patient identification, verified procedure, site/side was marked, verified correct patient position, special equipment/implants available, medications/allergies/relevent history reviewed, required imaging and test results available.  Performed  In preparation for procedure, patient was given 100% FiO2 and bronchoscope lubricated. Sedation: Benzodiazepines  Airway entered and the following bronchi were examined: Bronchi.   Procedures performed: inspection, visualisation for perc trach of guidewire, posterior wall during dilation, position finally confirmed by visualisation of carina Bronchoscope removed.  , Patient placed back on 100% FiO2 at conclusion of procedure.    Evaluation Hemodynamic Status: BP stable throughout; O2 sats: stable throughout Patient's Current Condition: stable Specimens:  None Complications: No apparent complications Patient did tolerate procedure well.   ALVA,RAKESH V. 04/01/2013

## 2013-04-01 NOTE — Progress Notes (Signed)
Patient well known to me.  She needs access for nutrition now that her ileus has resolved.  Will perform PEG at the bedside today.  Kathryne Eriksson. Dahlia Bailiff, MD, [place] [phone] [facility]

## 2013-04-01 NOTE — Progress Notes (Signed)
PARENTERAL NUTRITION CONSULT NOTE - FOLLOW UP  Pharmacy Consult for TPN Indication: prolonged ileus  Allergies  Allergen Reactions  . Penicillins Swelling    Patient Measurements: Height: 5\' 2"  (157.5 cm) Weight: 160 lb 11.5 oz (72.9 kg) IBW/kg (Calculated) : 50.1  Vital Signs: Temp: 98.2 F (36.8 C) (02/06 0723) Temp src: Oral (02/06 0723) BP: 147/49 mmHg (02/06 0746) Pulse Rate: 76 (02/06 0746) Intake/Output from previous day: 02/05 0701 - 02/06 0700 In: 2808 [I.V.:433; NG/GT:30; IV Piggyback:750; TPN:1595] Out: 3600 [Urine:3400; Emesis/NG output:200] Intake/Output from this shift: Total I/O In: 65 [TPN:65] Out: -   Labs:  Recent Labs  03/30/13 0355 03/31/13 0405 04/01/13 0415  WBC 13.2* 13.2* 14.2*  HGB 7.0* 7.0* 7.2*  HCT 20.7* 20.9* 21.9*  PLT 225 284 344  APTT  --   --  31  INR  --   --  1.02     Recent Labs  03/30/13 0355 03/30/13 1600  03/30/13 1815 03/31/13 0405 04/01/13 0415  NA 144 141  --   --  146 147  K 3.1* 4.3  --   --  3.5* 4.2  CL 106 106  --   --  108 108  CO2 26 24  --   --  27 27  GLUCOSE 170* 550*  < > 122* 174* 282*  BUN 74* 67*  --   --  70* 72*  CREATININE 2.73* 2.25*  --   --  2.25* 1.98*  CALCIUM 8.0* 7.6*  --   --  8.0* 8.6  MG 1.9  --   --   --  2.1  --   PHOS 3.2  --   --   --  3.3  --   PROT  --   --   --   --  4.9*  --   ALBUMIN  --   --   --   --  1.7*  --   AST  --   --   --   --  19  --   ALT  --   --   --   --  14  --   ALKPHOS  --   --   --   --  75  --   BILITOT  --   --   --   --  0.4  --   < > = values in this interval not displayed. Estimated Creatinine Clearance: 19.1 ml/min (by C-G formula based on Cr of 1.98).    Recent Labs  03/31/13 2319 04/01/13 0401 04/01/13 0725  GLUCAP 198* 255* 260*    Medications:  Infusions:  . Marland KitchenTPN (CLINIMIX-E) Adult 65 mL/hr at 03/31/13 2121  . sodium chloride 20 mL/hr at 04/01/13 0700    Insulin Requirements in the past 24 hours: 41 units SSI, TPN has 20  units/bag; CBG elevated due to addition of solumedrol 80 IV q8h x 3 doses for stridor  Current Nutrition:  TPN at 65 ml/hr plus lipids at 10 ml/hr on MWF only   Assessment: 80 year old female admitted with SBO s/p ex lap and bowel resection on 1/28.  She is to on TPN for prolonged ileus. Intubated 2/1 for acute metabolic acidosis.  CT 2/1: expected post po changes.  Failed extubated on 2/5. S/p trach 2/6.  To get bedside PEG today for tube feeding for nutrition support.    Nutrition: baseline prealbumin 6.2, reflects post-op status and inflammatory state GI: SBO s/p ex-lap with bowel resection 1/28.  NPO. NGO 200 mls. To get bedside PEG today to start enteral nutrition  Endo: no history of DM. CBG 198 - 255.  Hyperglycemic due to new solumedrol added yesterday for stridor. 3 doses of solumedrol 80 IV q8h completed at 0628.  on resistant SSI. Got 41 units of SSI  Past 24 hours.  TPN bag with 20 units of insulin/bag  Lytes:  K 4.2 after 3 runs yesterday. Na trending upwards, 147 today  Renal: SCr 1.98 decreasing trend; UOP 2 ml/kg/hr, AKI improving, lactic acidosis resolved;  Net out - 714mls Pulm: intubated 2/1, trached 2/6 Hepatobil: AST/ALT ok, Tbili ok TPN Access: PICC placed 1/31 TPN day#: 7  Nutritional Goals:  RD assessment 2/2 1307  KCal, 76 - 89 grams of protein per day  Plan:   1. Continue Clinimix E 5/15 (with electrolytes) at 65 ml/hr + 20% lipids at 10 ml/hr on MWF only.  This provides a weekly daily average of 78 gm protein and 1314 kcals which is 100% support.  2. continue insulin 20 units/2L bag to TPN, continue Resistant SSI. She has completed 3 doses of solumedrol for stridor.  3. F/u for transition to tube feeds over the weekend after placement of PEG today Eudelia Bunch, Pharm.D. [phone] 04/01/2013 9:24 AM

## 2013-04-01 NOTE — Progress Notes (Signed)
Notified [facility] that patient's CBGs have been > or = to 197 to 281. Patient is on TNA and is receiving antibiotics mixed in dextrose. Also receiving solumedrol and has had trach and peg performed today. Currently on SSI only.

## 2013-04-02 LAB — GLUCOSE, CAPILLARY
GLUCOSE-CAPILLARY: 185 mg/dL — AB (ref 70–99)
GLUCOSE-CAPILLARY: 199 mg/dL — AB (ref 70–99)
GLUCOSE-CAPILLARY: 270 mg/dL — AB (ref 70–99)
GLUCOSE-CAPILLARY: 271 mg/dL — AB (ref 70–99)
Glucose-Capillary: 222 mg/dL — ABNORMAL HIGH (ref 70–99)
Glucose-Capillary: 266 mg/dL — ABNORMAL HIGH (ref 70–99)

## 2013-04-02 LAB — BASIC METABOLIC PANEL
BUN: 72 mg/dL — ABNORMAL HIGH (ref 6–23)
CALCIUM: 8.7 mg/dL (ref 8.4–10.5)
CO2: 27 mEq/L (ref 19–32)
CREATININE: 1.68 mg/dL — AB (ref 0.50–1.10)
Chloride: 112 mEq/L (ref 96–112)
GFR calc Af Amer: 31 mL/min — ABNORMAL LOW (ref >90)
GFR calc non Af Amer: 26 mL/min — ABNORMAL LOW (ref >90)
Glucose, Bld: 250 mg/dL — ABNORMAL HIGH (ref 70–99)
Potassium: 4 mEq/L (ref 3.7–5.3)
SODIUM: 149 meq/L — AB (ref 137–147)

## 2013-04-02 LAB — CBC
HCT: 22.7 % — ABNORMAL LOW (ref 36.0–46.0)
Hemoglobin: 7.5 g/dL — ABNORMAL LOW (ref 12.0–15.0)
MCH: 24.8 pg — ABNORMAL LOW (ref 26.0–34.0)
MCHC: 33 g/dL (ref 30.0–36.0)
MCV: 74.9 fL — AB (ref 78.0–100.0)
Platelets: 404 10*3/uL — ABNORMAL HIGH (ref 150–400)
RBC: 3.03 MIL/uL — ABNORMAL LOW (ref 3.87–5.11)
RDW: 20.5 % — AB (ref 11.5–15.5)
WBC: 19.2 10*3/uL — AB (ref 4.0–10.5)

## 2013-04-02 LAB — MAGNESIUM: Magnesium: 1.7 mg/dL (ref 1.5–2.5)

## 2013-04-02 MED ORDER — M.V.I. ADULT IV INJ
INTRAVENOUS | Status: AC
  Administered 2013-04-02: 18:00:00 via INTRAVENOUS
  Filled 2013-04-02: qty 2000

## 2013-04-02 MED ORDER — VITAL AF 1.2 CAL PO LIQD
1000.0000 mL | ORAL | Status: DC
  Administered 2013-04-02 – 2013-04-18 (×13): 1000 mL
  Filled 2013-04-02 (×25): qty 1000

## 2013-04-02 MED ORDER — FREE WATER
200.0000 mL | Freq: Three times a day (TID) | Status: DC
  Administered 2013-04-02 – 2013-04-03 (×4): 200 mL

## 2013-04-02 NOTE — Progress Notes (Signed)
NUTRITION FOLLOW UP  Intervention:   TPN wean per Pharmacy Initiate Vital AF 1.2 @ 20 ml/hr via PEG and increase by 10 ml every 8 hours to goal rate of 50 ml/hr.  At goal rate, tube feeding regimen will provide 1440 kcal, 90 grams of protein, and 973 ml of H2O.  Diet advancement per MD RD to continue to monitor nutrition care plan.    Nutrition Dx:   Inadequate oral intake related to inability to eat as evidenced by ileus; ongoing, improving  Goal:   Pt to meet >/= 90% of their estimated nutrition needs; being met  Monitor:   Weight trends, Labs, TPN rate/wean, TF initiation/tolerance/rate, diet advancement  Assessment:   80 year old female admitted with SBO s/p ex lap and bowel resection on 1/28. She is to on TPN for prolonged ileus. Intubated 2/1 for acute metabolic acidosis. CT 2/1: expected post po changes. Failed extubated on 2/5. S/p trach 2/6. PEG placed on 2/6.   Pt is off vent support. RD consulted for TF initiation and management. Plan to transition off TPN once at goal for TF per MD note. Pt currently receiving Clinimix E 5/15 (with electrolytes) at 65 ml/hr + 20% lipids at 10 ml/hr on MWF only. This provides a weekly daily average of 78 gm protein and 1314 kcals. Last BM 2/5. Per RN, BM's are small. Pt has been NPO since 1/29. Will advanced TF slowly.  Labs: Blood Glucose ranging 199 to 313 mg/dL, high sodium, high BUN, elevated creatinine, low GFR Free water flushes added by MD: 200 ml free water flushes TID  Height: Ht Readings from Last 1 Encounters:  03/23/13 5' 2" (1.575 m)    Weight Status:   Wt Readings from Last 1 Encounters:  03/31/13 160 lb 11.5 oz (72.9 kg)  Weight increased 12 lbs since 03/23/13 (1/28 weight was 141 lbs)  Re-estimated needs:  Kcal: 1480- 1730 Protein: 80-90 grams Fluid: 1.7-1.9 L/day  Skin: abdominal incision, skin tear on lip; PEG  Diet Order: NPO   Intake/Output Summary (Last 24 hours) at 04/02/13 1104 Last data filed at 04/02/13  0900  Gross per 24 hour  Intake   2240 ml  Output   1676 ml  Net    564 ml    Last BM: 2/5   Labs:   Recent Labs Lab 03/29/13 0500 03/30/13 0355  03/31/13 0405 04/01/13 0415 04/02/13 0459  NA 141 144  < > 146 147 149*  K 3.2* 3.1*  < > 3.5* 4.2 4.0  CL 107 106  < > 108 108 112  CO2 21 26  < > _0 BUN 72* 74*  < > 70* 72* 72*  CREATININE 3.28* 2.73*  < > 2.25* 1.98* 1.68*  CALCIUM 7.8* 8.0*  < > 8.0* 8.6 8.7  MG 1.5 1.9  --  2.1  --  1.7  PHOS 3.2 3.2  --  3.3  --   --   GLUCOSE 278* 170*  < > 174* 282* 250*  < > = values in this interval not displayed.  CBG (last 3)   Recent Labs  04/01/13 1925 04/01/13 2339 04/02/13 0349  GLUCAP 301* 271* 199*    Scheduled Meds: . antiseptic oral rinse  15 mL Mouth Rinse QID  . chlorhexidine  15 mL Mouth Rinse BID  . enoxaparin (LOVENOX) injection  30 mg Subcutaneous Q24H  . etomidate  40 mg Intravenous Once  . free water  200 mL Per Tube  Q8H  . insulin aspart  0-20 Units Subcutaneous Q4H  . insulin glargine  10 Units Subcutaneous Daily  . metoprolol  5 mg Intravenous Q6H  . pantoprazole (PROTONIX) IV  40 mg Intravenous Q12H  . sodium chloride  10-40 mL Intracatheter Q12H    Continuous Infusions: . Marland KitchenTPN (CLINIMIX-E) Adult 65 mL/hr at 04/01/13 1756   And  . fat emulsion 250 mL (04/01/13 1755)  . Marland KitchenTPN (CLINIMIX-E) Adult    . sodium chloride 20 mL/hr at 04/01/13 0900    Pryor Ochoa RD, LDN Inpatient Clinical Dietitian Pager: [phone] After Hours Pager: [phone]

## 2013-04-02 NOTE — Progress Notes (Signed)
PARENTERAL NUTRITION CONSULT NOTE - FOLLOW UP  Pharmacy Consult for TPN Indication: prolonged ileus  Allergies  Allergen Reactions  . Penicillins Swelling    Patient Measurements: Height: 5\' 2"  (157.5 cm) Weight: 160 lb 11.5 oz (72.9 kg) IBW/kg (Calculated) : 50.1  Vital Signs: Temp: 98.1 F (36.7 C) (02/07 0400) Temp src: Oral (02/07 0400) BP: 175/62 mmHg (02/07 0400) Pulse Rate: 64 (02/07 0400) Intake/Output from previous day: 02/06 0701 - 02/07 0700 In: 2208.3 [I.V.:433.3; IV Piggyback:300; TPN:1475] Out: 1526 [Urine:1450; Drains:75] Intake/Output from this shift:    Labs:  Recent Labs  03/31/13 0405 04/01/13 0415 04/02/13 0459  WBC 13.2* 14.2* 19.2*  HGB 7.0* 7.2* 7.5*  HCT 20.9* 21.9* 22.7*  PLT 284 344 404*  APTT  --  31  --   INR  --  1.02  --      Recent Labs  03/31/13 0405 04/01/13 0415 04/02/13 0459  NA 146 147 149*  K 3.5* 4.2 4.0  CL 108 108 112  CO2 27 27 27   GLUCOSE 174* 282* 250*  BUN 70* 72* 72*  CREATININE 2.25* 1.98* 1.68*  CALCIUM 8.0* 8.6 8.7  MG 2.1  --  1.7  PHOS 3.3  --   --   PROT 4.9*  --   --   ALBUMIN 1.7*  --   --   AST 19  --   --   ALT 14  --   --   ALKPHOS 75  --   --   BILITOT 0.4  --   --    Estimated Creatinine Clearance: 22.5 ml/min (by C-G formula based on Cr of 1.68).    Recent Labs  04/01/13 1925 04/01/13 2339 04/02/13 0349  GLUCAP 301* 271* 199*    Medications:  Infusions:  . Marland KitchenTPN (CLINIMIX-E) Adult 65 mL/hr at 04/01/13 1756   And  . fat emulsion 250 mL (04/01/13 1755)  . sodium chloride 20 mL/hr at 04/01/13 0900    Insulin Requirements in the past 24 hours: 48 units SSI, TPN has 20 units/bag; CBG elevated due to addition of solumedrol 80 IV q8h x 3 doses for stridor on 2/5 and 2/6.  Lantus 10 units daily added yesterday by MD.  Current Nutrition:  TPN at 65 ml/hr plus lipids at 10 ml/hr on MWF only   Assessment: 80 year old female admitted with SBO s/p ex lap and bowel resection on 1/28.   She is to on TPN for prolonged ileus. Intubated 2/1 for acute metabolic acidosis.  CT 2/1: expected post po changes.  Failed extubated on 2/5. S/p trach 2/6.  PEG placed on 2/6.    Nutrition: baseline prealbumin 6.2, reflects post-op status and inflammatory state  GI: SBO s/p ex-lap with bowel resection 1/28.  NPO. NGO 200 mls. PEG placed on 2/6.  Await orders for enteral nutrition.  Endo: no history of DM. CBG 199-301.  Hyperglycemic due to solumedrol added 2/5-2/6 for stridor. 3 doses of solumedrol 80 IV q8h completed at 0628.  on resistant SSI. Got 47 units of SSI  Past 24 hours.  TPN bag with 20 units of insulin/bag.  Lantus 10 units daily added by MD.  Evaristo Bury:  K 4 after 3 runs yesterday. Na trending upwards, 149 today.  Mg 1.7.  Renal: SCr 1.68 with decreasing trend; UOP 2 ml/kg/hr, AKI improving, lactic acidosis resolved;  Positive 362 mL yesterday Pulm: intubated 2/1, trached 2/6 Hepatobil: AST/ALT ok, Tbili ok TPN Access: PICC placed 1/31 TPN day#: 7  Nutritional Goals:  RD assessment 2/2 1307  KCal, 76 - 89 grams of protein per day  Plan:   1. Continue Clinimix E 5/15 (with electrolytes) at 65 ml/hr + 20% lipids at 10 ml/hr on MWF only.  This provides a weekly daily average of 78 gm protein and 1314 kcals which is 100% support.  2. continue insulin 20 units/2L bag to TPN, continue Resistant SSI. She has completed 3 doses of solumedrol for stridor.  3. F/u for transition to tube feeds over the weekend after placement of PEG 2/6.  Heide Guile, PharmD, [facility] Clinical Pharmacist Pager [phone]   04/02/2013 7:43 AM

## 2013-04-02 NOTE — Op Note (Signed)
[name], Kristi Alexander NO.:  [id number]  MEDICAL RECORD NO.:  [id number]  LOCATION:  2S16C                        FACILITY:  [facility]  PHYSICIAN:  Raylene Miyamoto, MD DATE OF BIRTH:  1925/11/09  DATE OF PROCEDURE: DATE OF DISCHARGE:                              OPERATIVE REPORT   __________  PROCEDURE:  Percutaneous tracheostomy.  PREOPERATIVE DIAGNOSES:  Vent-dependent respiratory failure, small bowel obstruction, stridor post extubation, requiring re-intubation secondary to airway edema.  POSTOPERATIVE DIAGNOSES:  Status post tracheostomy secondary to airway edema, post failed extubation.  DESCRIPTION OF THE PROCEDURE:  The patient was placed in the supine position.  Consent was obtained from the patient's daughter and the patient was fully aware of risks and benefits of procedure including infection, bleeding, pneumothorax, and death.  The bronchoscopist of the procedure was Dr. Kara Mead.  The bronchoscopist placed a bronchoscope through the endotracheal tube backed up to approximately 17 cm. ChloraPrep was used to sterilize the operative site over the second endotracheal space.  The patient required Versed, fentanyl, and propofol for the procedure, did not require paralysis.  After lidocaine and epinephrine administration of approximately 6 mL, I made a 1 cm vertical incision over the second endotracheal space.  Dissection was made through the fat and noted a small bit of thyroid tissue but not in the surgical field.  Of course, we avoided that tissue.  Did note the __________strap muscles.  We were able to dissect through the strap muscles on top of the airway without any notion of any vascular structures.  Under direct visualization, placed an 18-gauge needle over white catheter sheath into the airway.  The white catheter sheath was removed.  I then placed a wire through the white catheter sheath and white catheter sheath was removed __________  visualized without any posterior wall injury.  I then placed a 14-French punch dilator __________ airway, then placed a progressive rhino dilator over a glider to approximate 30-French and the airway, the glider, and wire remained. Then a size 6 tracheostomy over 26-French dilator was placed over the glider and wire successfully into the airway __________ removed except for tracheostomy.  Bronchoscopist placed the bronchoscope through new tracheostomy __________ carina approximately 4 cm below without any posterior wall injury or active bleeding.  The patient tolerated the procedure quite well.  The tracheostomy suture placed with 4-0 monofilament sutures.  Postoperative chest x-ray revealed a normally placed tracheostomy tube with no apparent complications __________ no pneumothorax.  Blood loss during the procedure was less than 5 mL.  The patient tolerated the procedure quite well.  This patient is to follow up in our Percutaneous Tracheostomy Clinic by calling [phone].     Raylene Miyamoto, MD     DJF/MEDQ  D:  04/01/2013  T:  04/02/2013  Job:  [id number]  cc:   Gwenyth Ober, M.D. Dickey Gave, MD Rigoberto Noel, MD

## 2013-04-02 NOTE — Progress Notes (Signed)
Name: Kristi Alexander MRN: [id number] DOB: [birth year]    ADMISSION DATE:  03/22/2013 CONSULTATION DATE:  1/28  REFERRING MD :  Dr Hulen Skains PRIMARY SERVICE: [facility]  CHIEF COMPLAINT:  Vent and medical management   BRIEF PATIENT DESCRIPTION: 80 yo female with hx HTN admitted 1/27 with SBO.  Initially managed conservatively but cont to have worsening abd pain and increased WBC and was taken to OR 1/28 for exp lap and bowel resection.  Remained on vent post op and PCCM consulted.   SIGNIFICANT EVENTS / STUDIES:  1/27 admitted with SBO, conservative management  1/28 necrotic bowel suspected, to OR, 3-4 feet frankly necrotic bowel removed (no perforation)  2/01 Re-intubated; CT abd >> possible adhesions causing obstruction of SB, colon thickening ?infection or ischemia  LINES / TUBES: ETT 1/28>>>1/29; 2/1>>> 2/5, 2/5 >> L brachial aline 1/28>>> out Rt PICC 2/1 >>  CULTURES: Urine 1/28>>> negative  ANTIBIOTICS: Flagyl 1/28>>> Cipro 1/29 >>   SUBJECTIVE:  Afebrile, tolerating ATC Good UO  Denies pain  VITAL SIGNS: Temp:  [98.1 F (36.7 C)-98.6 F (37 C)] 98.1 F (36.7 C) (02/07 0400) Pulse Rate:  [64-96] 65 (02/07 0815) Resp:  [17-40] 27 (02/07 0815) BP: (97-175)/(37-136) 152/136 mmHg (02/07 0800) SpO2:  [94 %-100 %] 100 % (02/07 0815) FiO2 (%):  [40 %] 40 % (02/07 0800) VENTILATOR SETTINGS: Vent Mode:  [-] PRVC FiO2 (%):  [40 %] 40 % Set Rate:  [15 bmp] 15 bmp Vt Set:  [400 mL] 400 mL PEEP:  [5 cmH20] 5 cmH20 Plateau Pressure:  [18 cmH20] 18 cmH20 INTAKE / OUTPUT: Intake/Output     02/06 0701 - 02/07 0700 02/07 0701 - 02/08 0700   I.V. (mL/kg) 493.3 (6.8)    NG/GT     IV Piggyback 300    TPN 1700    Total Intake(mL/kg) 2493.3 (34.2)    Urine (mL/kg/hr) 1675 (1)    Emesis/NG output     Drains 75 (0)    Other 1 (0)    Total Output 1751     Net +742.3            PHYSICAL EXAMINATION: General:  No distress Neuro:  Awake, follows commands, RASS 0 HEENT: trach -  no bleeding Cardiovascular:  s1s2 rrr Lungs: b/l rhonchi, basilar rales Abdomen:  Soft,non tender RLQWound dressing clean Musculoskeletal:  1+ edema  LABS:  CBC  Recent Labs Lab 03/31/13 0405 04/01/13 0415 04/02/13 0459  WBC 13.2* 14.2* 19.2*  HGB 7.0* 7.2* 7.5*  HCT 20.9* 21.9* 22.7*  PLT 284 344 404*   Coag's  Recent Labs Lab 03/28/13 0330 04/01/13 0415  APTT 33 31  INR 1.27 1.02   BMET  Recent Labs Lab 03/31/13 0405 04/01/13 0415 04/02/13 0459  NA 146 147 149*  K 3.5* 4.2 4.0  CL 108 108 112  CO2 27 27 27   BUN 70* 72* 72*  CREATININE 2.25* 1.98* 1.68*  GLUCOSE 174* 282* 250*   Electrolytes  Recent Labs Lab 03/29/13 0500 03/30/13 0355  03/31/13 0405 04/01/13 0415 04/02/13 0459  CALCIUM 7.8* 8.0*  < > 8.0* 8.6 8.7  MG 1.5 1.9  --  2.1  --  1.7  PHOS 3.2 3.2  --  3.3  --   --   < > = values in this interval not displayed. Sepsis Markers  Recent Labs Lab 03/27/13 0939 03/28/13 0355  LATICACIDVEN 1.1 1.3   ABG  Recent Labs Lab 03/28/13 0420 03/30/13 1309 03/31/13 1440  PHART  7.311* 7.410 7.426  PCO2ART 35.5 44.3 41.5  PO2ART 91.1 134.0* 75.0*   Liver Enzymes  Recent Labs Lab 03/27/13 0351 03/28/13 0330 03/31/13 0405  AST 61* 38* 19  ALT 45* 32 14  ALKPHOS 101 80 75  BILITOT 0.6 0.6 0.4  ALBUMIN 1.9* 1.7* 1.7*   Glucose  Recent Labs Lab 04/01/13 1204 04/01/13 1534 04/01/13 1923 04/01/13 1925 04/01/13 2339 04/02/13 0349  GLUCAP 221* 281* 313* 301* 271* 199*    Imaging Chest Portable 1 View To Assess Tube Placement And Rule-out Pneumothorax  04/01/2013   CLINICAL DATA:  Tube placement.  Evaluate for pneumothorax.  EXAM: PORTABLE CHEST - 1 VIEW  COMPARISON:  DG CHEST 1V PORT dated 04/01/2013; DG CHEST 1V PORT dated 03/31/2013  FINDINGS: The endotracheal tube has been replaced by tracheostomy. The tip is at the level of the clavicles and it appears in good position. Nasogastric tube and right upper extremity PICC unchanged.  Basilar airspace disease and atelectasis also appears similar to exam earlier today. Left pleural effusion.  No pneumothorax.  IMPRESSION: Interval exchange of the endotracheal tube for tracheostomy. No complicating features. Other support apparatus stable.   Electronically Signed   By: Dereck Ligas M.D.   On: 04/01/2013 09:14   Portable Chest Xray In Am  04/01/2013   CLINICAL DATA:  Interstitial edema.  EXAM: PORTABLE CHEST - 1 VIEW  COMPARISON:  DG CHEST 1V PORT dated 03/31/2013  FINDINGS: Endotracheal and NG tube in stable position. Central line in stable position. Stable cardiomegaly and pulmonary vascular prominence. Progressive bilateral pulmonary edema with bilateral pleural effusions noted. Findings consistent with congestive heart failure . No pneumothorax.  IMPRESSION: 1. Line and tube position stable. 2. Progressive congestive heart failure with pulmonary edema and small bilateral pleural effusions. Superimposed bibasilar pneumonia cannot be excluded.   Electronically Signed   By: Marcello Moores  Register   On: 04/01/2013 07:29   Portable Chest Xray  03/31/2013   CLINICAL DATA:  Endotracheal tube placement  EXAM: PORTABLE CHEST - 1 VIEW  COMPARISON:  February 5th 2015 5:45 a.m.  FINDINGS: The heart size and mediastinal contours are stable. The heart size is enlarged. Endotracheal tube is identified with distal tip 3.8 cm from carina. Right subclavian central venous line is unchanged. There is mild diffuse increased pulmonary interstitium bilaterally. There are probable minimal bilateral pleural effusions. The visualized skeletal structures are stable.  IMPRESSION: Endotracheal tube distal tip 3.8 cm from carina. There is no pneumothorax. Interstitial pulmonary edema.   Electronically Signed   By: Abelardo Diesel M.D.   On: 03/31/2013 13:28   ASSESSMENT / PLAN:  PULMONARY A: Acute respiratory failure in setting of ischemic bowel and sepsis.  Re-intubated 2/1. Pleural effusions. P:   -ATC as  tolerated -PM valve -dc solumedrol    CARDIOVASCULAR A: Severe sepsis >> resolved. Hx of HTN. P:  -continue scheduled IV lopressor  RENAL A: Acute kidney injury (baseline creatinine 0.9 from 03/22/13) -improving Non-anion gap metabolic acidosis. Lactic acidosis >> resolved. Hypervolemia. Hypernatremia P:   -monitor renal fx urine outpt. Electrolytes -Add free water   GASTROINTESTINAL A: SBO from ischemic bowel >> CT abd findings from 2/01 noted. Protein calorie malnutrition P:   -post-op care, nutrition per [facility]-starting TFs, transition off TNA once at goal -protonix for SUP   HEMATOLOGIC A: Anemia of critical illness. P:  -transfuse 1U PRBC once Hb < 7 -lovenox for DVT prevention  INFECTIOUS A: Severe sepsis 2nd to ischemic bowel. P:   -dc cipro/flagyl  ENDOCRINE A: Hyperglycemia. P:   -SSI -resistant scale, sugars high due to steroids, expect to come back donw, lantus added  NEUROLOGIC A: Post op pain  P:   -PRN fentanyl.  Summary:   Needs  aggressive rehabShe was previously DNR status. Proceed with trach collar, can plan for LTAC   CC time 31 minutes.   Kara Mead MD. Shade Flood. [facility] Pager [phone] If no response call [phone]   04/02/2013, 8:35 AM

## 2013-04-02 NOTE — Progress Notes (Signed)
Kristi Alexander Progress Note 1 Day Post-Op  Subjective: Patient looks marvelous this AM.  Completely awake and alert.  Having minimal discomfort.    Objective: Vital signs in last 24 hours: Temp:  [98.1 F (36.7 C)-98.6 F (37 C)] 98.1 F (36.7 C) (02/07 0400) Pulse Rate:  [64-96] 70 (02/07 0500) Resp:  [17-40] 21 (02/07 0500) BP: (97-175)/(37-95) 175/59 mmHg (02/07 0500) SpO2:  [95 %-100 %] 100 % (02/07 0500) FiO2 (%):  [40 %-100 %] 40 % (02/07 0500) Last BM Date: 03/31/13  Intake/Output from previous day: 02/06 0701 - 02/07 0700 In: 2493.3 [I.V.:493.3; IV Piggyback:300; TPN:1700] Out: 1751 [Urine:1675; Drains:75] Intake/Output this shift:    General: No acute distress  Lungs: Clear.  Doing well on trach  Abd: Soft, good bowel sounds, G-tube draining okay  Extremities: No problems  Neuro: Intact  Lab Results:  @LABLAST2 (wbc:2,hgb:2,hct:2,plt:2) BMET  Recent Labs  04/01/13 0415 04/02/13 0459  NA 147 149*  K 4.2 4.0  CL 108 112  CO2 27 27  GLUCOSE 282* 250*  BUN 72* 72*  CREATININE 1.98* 1.68*  CALCIUM 8.6 8.7   PT/INR  Recent Labs  04/01/13 0415  LABPROT 13.2  INR 1.02   ABG  Recent Labs  03/30/13 1309 03/31/13 1440  PHART 7.410 7.426  HCO3 28.1* 27.4*    Studies/Results: Chest Portable 1 View To Assess Tube Placement And Rule-out Pneumothorax  04/01/2013   CLINICAL DATA:  Tube placement.  Evaluate for pneumothorax.  EXAM: PORTABLE CHEST - 1 VIEW  COMPARISON:  DG CHEST 1V PORT dated 04/01/2013; DG CHEST 1V PORT dated 03/31/2013  FINDINGS: The endotracheal tube has been replaced by tracheostomy. The tip is at the level of the clavicles and it appears in good position. Nasogastric tube and right upper extremity PICC unchanged. Basilar airspace disease and atelectasis also appears similar to exam earlier today. Left pleural effusion.  No pneumothorax.  IMPRESSION: Interval exchange of the endotracheal tube for tracheostomy. No complicating features. Other  support apparatus stable.   Electronically Signed   By: Dereck Ligas M.D.   On: 04/01/2013 09:14   Portable Chest Xray In Am  04/01/2013   CLINICAL DATA:  Interstitial edema.  EXAM: PORTABLE CHEST - 1 VIEW  COMPARISON:  DG CHEST 1V PORT dated 03/31/2013  FINDINGS: Endotracheal and NG tube in stable position. Central line in stable position. Stable cardiomegaly and pulmonary vascular prominence. Progressive bilateral pulmonary edema with bilateral pleural effusions noted. Findings consistent with congestive heart failure . No pneumothorax.  IMPRESSION: 1. Line and tube position stable. 2. Progressive congestive heart failure with pulmonary edema and small bilateral pleural effusions. Superimposed bibasilar pneumonia cannot be excluded.   Electronically Signed   By: Marcello Moores  Register   On: 04/01/2013 07:29   Portable Chest Xray  03/31/2013   CLINICAL DATA:  Endotracheal tube placement  EXAM: PORTABLE CHEST - 1 VIEW  COMPARISON:  February 5th 2015 5:45 a.m.  FINDINGS: The heart size and mediastinal contours are stable. The heart size is enlarged. Endotracheal tube is identified with distal tip 3.8 cm from carina. Right subclavian central venous line is unchanged. There is mild diffuse increased pulmonary interstitium bilaterally. There are probable minimal bilateral pleural effusions. The visualized skeletal structures are stable.  IMPRESSION: Endotracheal tube distal tip 3.8 cm from carina. There is no pneumothorax. Interstitial pulmonary edema.   Electronically Signed   By: Abelardo Diesel M.D.   On: 03/31/2013 13:28    Anti-infectives: Anti-infectives   Start     Dose/Rate  Route Frequency Ordered Stop   03/24/13 0800  ciprofloxacin (CIPRO) IVPB 400 mg     400 mg 200 mL/hr over 60 Minutes Intravenous Every 24 hours 03/23/13 1241     03/23/13 1700  metroNIDAZOLE (FLAGYL) IVPB 500 mg     500 mg 100 mL/hr over 60 Minutes Intravenous Every 8 hours 03/23/13 1236     03/23/13 0730  [MAR Hold]  ciprofloxacin  (CIPRO) IVPB 400 mg     (On MAR Hold since 03/23/13 0805)   400 mg 200 mL/hr over 60 Minutes Intravenous On call to O.R. 03/23/13 0728 03/23/13 0824   03/23/13 0730  [MAR Hold]  metroNIDAZOLE (FLAGYL) IVPB 500 mg     (On MAR Hold since 03/23/13 0805)   500 mg 100 mL/hr over 60 Minutes Intravenous On call to O.R. 03/23/13 3382 03/23/13 0910      Assessment/Plan: s/p Procedure(s): ESOPHAGOGASTRODUODENOSCOPY (EGD) PERCUTANEOUS ENDOSCOPIC GASTROSTOMY (PEG) PLACEMENT Advance diet Tube feedings. May be able to tolerate Passy-Muir valve and perhaps swallowing evaluation later.  LOS: 11 days   Kathryne Eriksson. Dahlia Bailiff, MD, FACS [phone] [facility] Surgery 04/02/2013

## 2013-04-02 NOTE — Evaluation (Signed)
[facility]y-[facility] Speaking Valve - Evaluation Patient Details  Name: Kristi Alexander MRN: [id number] Date of Birth: March 04, 1925  Today's Date: 04/02/2013 Time: 2353-6144 SLP Time Calculation (min): 24 min  Past Medical History:  Past Medical History  Diagnosis Date  . Restless leg syndrome   . Hypertension   . Dyslipidemia   . History of appendectomy   . H/O: hysterectomy   . Cyst of breast, right, benign solitary   . Inguinal hernia   . History of lumbosacral spine surgery    Past Surgical History:  Past Surgical History  Procedure Laterality Date  . Appendectomy    . Abdominal hysterectomy    . Laparotomy N/A 03/23/2013    Procedure: EXPLORATORY LAPAROTOMY;  Surgeon: Gwenyth Ober, MD;  Location: [facility];  Service: General;  Laterality: N/A;  . Bowel resection N/A 03/23/2013    Procedure: SMALL BOWEL RESECTION;  Surgeon: Gwenyth Ober, MD;  Location: [facility];  Service: General;  Laterality: N/A;  . Tracheostomy      feinstein   HPI:  80 yo female adm to [facility] 03/22/13 with SBO, pt is s/p surgery, trach #6 Shiley cuffed 04/01/13 and PEG placement.  She had VDRF and had respiratory arrest last Sunday due to mucus plug per SLP conversation with RN.  Order for PMSV received- RN phoned respiratory therapy and requested their participation to deflate cuff prior to testing.     Assessment / Plan / Recommendation Clinical Impression  Pt with good tolerance of cuff deflation, although she did require tracheal suctioning by RT after deflation - suspect due to secretions retained above cuff.  PMSV was tolerated well during 15 minute trial with pt clearly appreciating ability to phonate. RR fluctuated from 23-35 during trial- initial measure was 30 RR, at end of trial RR was up to 33.  RR increased when pt conducting motor task eg: orally suctioning her secretions she was able to cough and expecroate with valve in place!    Vocal quality was hoarse and phonatory strength decreased but pt able to articulate  well and phonate at low volume level.  Per CCS MD speaking over videomonitor, pt with some vocal cord edema.   Rec continue use of PMSV with full supervision only for 15 minutes maximum, assuring secretions clear prior to placement and assesing vitals during use.    SLP educated pt to recommendations, indications and contraindications.  SLP to follow for continued PMSV tolerance, education, and swallow evaluation when MD deems indicated.    Thanks for this referral.  RN was present at end of evaluation and was going to re-inflate cuff for pt.     SLP Assessment  Patient needs continued Speech Lanaguage Pathology Services    Follow Up Recommendations  LTACH    Frequency and Duration min 2x/week  2 weeks   Pertinent Vitals/Pain Afebrile, decreased    SLP Goals Potential to Achieve Goals: Fair   PMSV Trial  PMSV was placed for: phonatory ability Able to redirect subglottic air through upper airway: Yes Able to Attain Phonation: Yes Voice Quality: Hoarse;Low vocal intensity (pt had vocal cord edema per CCS MD via video monitor conversation) Able to Expectorate Secretions: Yes Level of Secretion Expectoration with PMSV: Tracheal;Oral (tracheal without pmsv, oral with pmsv in place) Breath Support for Phonation: Moderately decreased Intelligibility: Intelligible Respirations During Trial: 26 SpO2 During Trial: 99 % Pulse During Trial: 98 Behavior: Alert;Expresses self well;Good eye contact;Responsive to questions   Tracheostomy Tube    #6 Shiley cuffed  Vent Dependency  FiO2 (%): 28 %    Cuff Deflation Trial Tolerated Cuff Deflation: Yes Length of Time for Cuff Deflation Trial: approx 5 minutes prior to placement of PMSV, Resp therapy present and suctioned pt after cuff deflation due to pt coughing up secretions Behavior: Alert;Expresses self well;Good eye contact;[place], Alexander [facility] SLP [phone]

## 2013-04-03 ENCOUNTER — Inpatient Hospital Stay (HOSPITAL_COMMUNITY): Payer: Medicare Other

## 2013-04-03 LAB — CBC WITH DIFFERENTIAL/PLATELET
Basophils Absolute: 0 10*3/uL (ref 0.0–0.1)
Basophils Relative: 0 % (ref 0–1)
EOS PCT: 1 % (ref 0–5)
Eosinophils Absolute: 0.2 10*3/uL (ref 0.0–0.7)
HCT: 26.4 % — ABNORMAL LOW (ref 36.0–46.0)
Hemoglobin: 8.7 g/dL — ABNORMAL LOW (ref 12.0–15.0)
LYMPHS ABS: 1 10*3/uL (ref 0.7–4.0)
Lymphocytes Relative: 5 % — ABNORMAL LOW (ref 12–46)
MCH: 24.9 pg — AB (ref 26.0–34.0)
MCHC: 33 g/dL (ref 30.0–36.0)
MCV: 75.6 fL — AB (ref 78.0–100.0)
MONO ABS: 1.9 10*3/uL — AB (ref 0.1–1.0)
Monocytes Relative: 10 % (ref 3–12)
Neutro Abs: 16 10*3/uL — ABNORMAL HIGH (ref 1.7–7.7)
Neutrophils Relative %: 84 % — ABNORMAL HIGH (ref 43–77)
Platelets: 475 10*3/uL — ABNORMAL HIGH (ref 150–400)
RBC: 3.49 MIL/uL — AB (ref 3.87–5.11)
RDW: 21.2 % — AB (ref 11.5–15.5)
WBC: 19.1 10*3/uL — ABNORMAL HIGH (ref 4.0–10.5)

## 2013-04-03 LAB — BASIC METABOLIC PANEL
BUN: 66 mg/dL — AB (ref 6–23)
CO2: 27 meq/L (ref 19–32)
CREATININE: 1.4 mg/dL — AB (ref 0.50–1.10)
Calcium: 8.6 mg/dL (ref 8.4–10.5)
Chloride: 116 mEq/L — ABNORMAL HIGH (ref 96–112)
GFR calc Af Amer: 38 mL/min — ABNORMAL LOW (ref >90)
GFR calc non Af Amer: 33 mL/min — ABNORMAL LOW (ref >90)
Glucose, Bld: 206 mg/dL — ABNORMAL HIGH (ref 70–99)
Potassium: 3.7 mEq/L (ref 3.7–5.3)
Sodium: 155 mEq/L — ABNORMAL HIGH (ref 137–147)

## 2013-04-03 LAB — GLUCOSE, CAPILLARY
GLUCOSE-CAPILLARY: 157 mg/dL — AB (ref 70–99)
GLUCOSE-CAPILLARY: 148 mg/dL — AB (ref 70–99)
Glucose-Capillary: 159 mg/dL — ABNORMAL HIGH (ref 70–99)
Glucose-Capillary: 139 mg/dL — ABNORMAL HIGH (ref 70–99)

## 2013-04-03 MED ORDER — INSULIN GLARGINE 100 UNIT/ML ~~LOC~~ SOLN
15.0000 [IU] | Freq: Every day | SUBCUTANEOUS | Status: DC
  Administered 2013-04-04 – 2013-04-05 (×2): 15 [IU] via SUBCUTANEOUS
  Filled 2013-04-03 (×3): qty 0.15

## 2013-04-03 MED ORDER — DEXTROSE 5 % IV SOLN
INTRAVENOUS | Status: DC
  Administered 2013-04-03: 50 mL via INTRAVENOUS
  Administered 2013-04-04: 09:00:00 via INTRAVENOUS

## 2013-04-03 MED ORDER — FREE WATER
200.0000 mL | Freq: Four times a day (QID) | Status: DC
  Administered 2013-04-03 – 2013-04-04 (×4): 200 mL

## 2013-04-03 NOTE — Progress Notes (Signed)
Called to assess pt for increased RR, increased WOB. O2 sats are 100% on 28% ATC, RR 37-45, rhonchi bilaterally. Tried to suction pt, unable to return any secretions. Pt lavaged via trach, and was able to cough out moderate amount of thick, yellow secretions. Pt appears more comfortable. RR has decreased to 28-35. Chest xray currently being done. RT will continue to monitor.

## 2013-04-03 NOTE — Progress Notes (Signed)
PARENTERAL NUTRITION CONSULT NOTE - FOLLOW UP  Pharmacy Consult for TPN Indication: prolonged ileus  Allergies  Allergen Reactions  . Penicillins Swelling    Patient Measurements: Height: 5\' 2"  (157.5 cm) Weight: 160 lb 11.5 oz (72.9 kg) IBW/kg (Calculated) : 50.1  Vital Signs: Temp: 98.6 F (37 C) (02/08 0731) Temp src: Oral (02/08 0731) BP: 158/53 mmHg (02/08 0804) Pulse Rate: 97 (02/08 0804) Intake/Output from previous day: 02/07 0701 - 02/08 0700 In: 2550 [I.V.:480; NG/GT:390; TPN:1680] Out: 2670 [Urine:2670] Intake/Output from this shift:    Labs:  Recent Labs  04/01/13 0415 04/02/13 0459  WBC 14.2* 19.2*  HGB 7.2* 7.5*  HCT 21.9* 22.7*  PLT 344 404*  APTT 31  --   INR 1.02  --      Recent Labs  04/01/13 0415 04/02/13 0459  NA 147 149*  K 4.2 4.0  CL 108 112  CO2 27 27  GLUCOSE 282* 250*  BUN 72* 72*  CREATININE 1.98* 1.68*  CALCIUM 8.6 8.7  MG  --  1.7   Estimated Creatinine Clearance: 22.5 ml/min (by C-G formula based on Cr of 1.68).    Recent Labs  04/02/13 1139 04/02/13 1638 04/02/13 1933  GLUCAP 266* 270* 185*    Medications:  Infusions:  . Marland KitchenTPN (CLINIMIX-E) Adult 65 mL/hr at 04/02/13 1733  . sodium chloride 20 mL/hr at 04/01/13 0900  . feeding supplement (VITAL AF 1.2 CAL) 1,000 mL (04/02/13 1530)    Insulin Requirements in the past 24 hours: 38 units SSI, TPN has 20 units/bag; CBG elevated due to addition tube feedings on 2/7.  Lantus 10 units daily added yesterday by MD.  Current Nutrition:  TPN at 65 ml/hr plus lipids at 10 ml/hr on MWF only.  Vital AF1.2 running at 40 mL/hr.  Goal rate is 50 mL/hr per RD assessment.  Assessment: 80 year old female admitted with SBO s/p ex lap and bowel resection on 1/28.  She is to on TPN for prolonged ileus. Intubated 2/1 for acute metabolic acidosis.  CT 2/1: expected post po changes.  Failed extubated on 2/5. S/p trach 2/6.  PEG placed on 2/6. Tolerating tube feedings, almost at  goal.   Nutrition: baseline prealbumin 6.2, reflects post-op status and inflammatory state  GI: SBO s/p ex-lap with bowel resection 1/28.  NPO. NGO 200 mls. PEG placed on 2/6.  Await orders for enteral nutrition.  Endo: no history of DM. CBG 199-301.  Hyperglycemic due to solumedrol added 2/5-2/6 for stridor. 3 doses of solumedrol 80 IV q8h completed at 0628.  on resistant SSI. Got 47 units of SSI  Past 24 hours.  TPN bag with 20 units of insulin/bag.  Lantus 10 units daily added by MD.  Evaristo Bury:  K 4 after 3 runs yesterday. Na trending upwards, 149 today.  Mg 1.7.  Renal: SCr 1.68 with decreasing trend; UOP 2 ml/kg/hr, AKI improving, lactic acidosis resolved;  Positive 362 mL yesterday Pulm: intubated 2/1, trached 2/6 Hepatobil: AST/ALT ok, Tbili ok TPN Access: PICC placed 1/31 TPN day#: 7  Nutritional Goals:  RD assessment 2/2 1307  KCal, 76 - 89 grams of protein per day  Plan:   1. Nutrition has been converted to Vital tube feeding.  Will decrease TPN to 30 mL/hr and discontinue TPN after current bag.   Heide Guile, PharmD, BCPS Clinical Pharmacist Pager [phone]   04/03/2013 9:14 AM

## 2013-04-03 NOTE — Progress Notes (Signed)
CCS/Kristi Alexander Progress Note 2 Days Post-Op  Subjective: Patient tachypneic and having some difficulty breathing.  Lots of secretions.  Oxygen saturation levels are okay.  Objective: Vital signs in last 24 hours: Temp:  [97.9 F (36.6 C)-98.6 F (37 C)] 98.6 F (37 C) (02/08 0731) Pulse Rate:  [65-113] 95 (02/08 0700) Resp:  [17-40] 37 (02/08 0700) BP: (146-189)/(55-136) 146/65 mmHg (02/08 0700) SpO2:  [94 %-100 %] 98 % (02/08 0700) FiO2 (%):  [28 %-40 %] 28 % (02/08 0700) Weight:  [72.9 kg (160 lb 11.5 oz)] 72.9 kg (160 lb 11.5 oz) (02/08 0500) Last BM Date: 03/31/13  Intake/Output from previous day: 02/07 0701 - 02/08 0700 In: 2550 [I.V.:480; NG/GT:390; TPN:1680] Out: 2670 [Urine:2670] Intake/Output this shift:    General: Some moderate respiratory distress.   Lungs: Cleared after suctioning, but still tachypneic  Abd: Soft, good bowel sounds.  Extremities: No changes  Neuro: Intact  Lab Results:  @LABLAST2 (wbc:2,hgb:2,hct:2,plt:2) BMET  Recent Labs  04/01/13 0415 04/02/13 0459  NA 147 149*  K 4.2 4.0  CL 108 112  CO2 27 27  GLUCOSE 282* 250*  BUN 72* 72*  CREATININE 1.98* 1.68*  CALCIUM 8.6 8.7   PT/INR  Recent Labs  04/01/13 0415  LABPROT 13.2  INR 1.02   ABG  Recent Labs  03/31/13 1440  PHART 7.426  HCO3 27.4*    Studies/Results: Chest Portable 1 View To Assess Tube Placement And Rule-out Pneumothorax  04/01/2013   CLINICAL DATA:  Tube placement.  Evaluate for pneumothorax.  EXAM: PORTABLE CHEST - 1 VIEW  COMPARISON:  DG CHEST 1V PORT dated 04/01/2013; DG CHEST 1V PORT dated 03/31/2013  FINDINGS: The endotracheal tube has been replaced by tracheostomy. The tip is at the level of the clavicles and it appears in good position. Nasogastric tube and right upper extremity PICC unchanged. Basilar airspace disease and atelectasis also appears similar to exam earlier today. Left pleural effusion.  No pneumothorax.  IMPRESSION: Interval exchange of the  endotracheal tube for tracheostomy. No complicating features. Other support apparatus stable.   Electronically Signed   By: Dereck Ligas M.D.   On: 04/01/2013 09:14    Anti-infectives: Anti-infectives   Start     Dose/Rate Route Frequency Ordered Stop   03/24/13 0800  ciprofloxacin (CIPRO) IVPB 400 mg  Status:  Discontinued     400 mg 200 mL/hr over 60 Minutes Intravenous Every 24 hours 03/23/13 1241 04/02/13 0839   03/23/13 1700  metroNIDAZOLE (FLAGYL) IVPB 500 mg  Status:  Discontinued     500 mg 100 mL/hr over 60 Minutes Intravenous Every 8 hours 03/23/13 1236 04/02/13 0839   03/23/13 0730  [MAR Hold]  ciprofloxacin (CIPRO) IVPB 400 mg     (On MAR Hold since 03/23/13 0805)   400 mg 200 mL/hr over 60 Minutes Intravenous On call to O.R. 03/23/13 0728 03/23/13 0824   03/23/13 0730  [MAR Hold]  metroNIDAZOLE (FLAGYL) IVPB 500 mg     (On MAR Hold since 03/23/13 0805)   500 mg 100 mL/hr over 60 Minutes Intravenous On call to O.R. 03/23/13 0240 03/23/13 0910      Assessment/Plan: s/p Procedure(s): ESOPHAGOGASTRODUODENOSCOPY (EGD) PERCUTANEOUS ENDOSCOPIC GASTROSTOMY (PEG) PLACEMENT Keep in ICU Check CXR Check labs.   LOS: 12 days   Kathryne Eriksson. Dahlia Bailiff, MD, FACS [phone] [facility] 04/03/2013

## 2013-04-03 NOTE — Progress Notes (Signed)
Pt breathing 40 times per minute.  Suctioned and bagged pt.  Pt stated she was getting tired and wanted to go back on so she could rest

## 2013-04-03 NOTE — Progress Notes (Signed)
Name: Kristi Alexander MRN: [id number] DOB: 1925-12-13    ADMISSION DATE:  03/22/2013 CONSULTATION DATE:  1/28  REFERRING MD :  Dr Hulen Skains PRIMARY SERVICE: CCS  CHIEF COMPLAINT:  Vent and medical management   BRIEF PATIENT DESCRIPTION: 80 yo female with hx HTN admitted 1/27 with SBO.  Initially managed conservatively but cont to have worsening abd pain and increased WBC and was taken to OR 1/28 for exp lap and bowel resection.  Remained on vent post op and PCCM consulted.   SIGNIFICANT EVENTS / STUDIES:  1/27 admitted with SBO, conservative management  1/28 necrotic bowel suspected, to OR, 3-4 feet frankly necrotic bowel removed (no perforation)  2/01 Re-intubated; CT abd >> possible adhesions causing obstruction of SB, colon thickening ?infection or ischemia  LINES / TUBES: ETT 1/28>>>1/29; 2/1>>> 2/5, 2/5 >>2/6 Trach 2/6 >> L brachial aline 1/28>>> out Rt PICC 2/1 >>  CULTURES: Urine 1/28>>> negative  ANTIBIOTICS: Flagyl 1/28>>>2/7 Cipro 1/29 >> 2/7  SUBJECTIVE:  Afebrile, tolerating ATC, resp distress this am , resolved with suction Good UO  Denies pain  VITAL SIGNS: Temp:  [97.9 F (36.6 C)-98.6 F (37 C)] 98.6 F (37 C) (02/08 0731) Pulse Rate:  [72-113] 97 (02/08 0804) Resp:  [17-40] 29 (02/08 0804) BP: (146-189)/(53-113) 158/53 mmHg (02/08 0804) SpO2:  [97 %-100 %] 98 % (02/08 0804) FiO2 (%):  [28 %-35 %] 28 % (02/08 0804) Weight:  [72.9 kg (160 lb 11.5 oz)] 72.9 kg (160 lb 11.5 oz) (02/08 0500) VENTILATOR SETTINGS: Vent Mode:  [-]  FiO2 (%):  [28 %-35 %] 28 % INTAKE / OUTPUT: Intake/Output     02/07 0701 - 02/08 0700 02/08 0701 - 02/09 0700   I.V. (mL/kg) 480 (6.6)    NG/GT 390    IV Piggyback     TPN 1680    Total Intake(mL/kg) 2550 (35)    Urine (mL/kg/hr) 2670 (1.5)    Drains     Other     Total Output 2670     Net -120            PHYSICAL EXAMINATION: General:  No distress Neuro:  Awake, follows commands, RASS 0 HEENT: trach - no  bleeding Cardiovascular:  s1s2 rrr Lungs: b/l rhonchi, basilar rales Abdomen:  Soft,non tender RLQWound dressing clean Musculoskeletal:  1+ edema  LABS:  CBC  Recent Labs Lab 04/01/13 0415 04/02/13 0459 04/03/13 0830  WBC 14.2* 19.2* 19.1*  HGB 7.2* 7.5* 8.7*  HCT 21.9* 22.7* 26.4*  PLT 344 404* 475*   Coag's  Recent Labs Lab 03/28/13 0330 04/01/13 0415  APTT 33 31  INR 1.27 1.02   BMET  Recent Labs Lab 04/01/13 0415 04/02/13 0459 04/03/13 0830  NA 147 149* 155*  K 4.2 4.0 3.7  CL 108 112 116*  CO2 27 27 27   BUN 72* 72* 66*  CREATININE 1.98* 1.68* 1.40*  GLUCOSE 282* 250* 206*   Electrolytes  Recent Labs Lab 03/29/13 0500 03/30/13 0355  03/31/13 0405 04/01/13 0415 04/02/13 0459 04/03/13 0830  CALCIUM 7.8* 8.0*  < > 8.0* 8.6 8.7 8.6  MG 1.5 1.9  --  2.1  --  1.7  --   PHOS 3.2 3.2  --  3.3  --   --   --   < > = values in this interval not displayed. Sepsis Markers  Recent Labs Lab 03/28/13 0355  LATICACIDVEN 1.3   ABG  Recent Labs Lab 03/28/13 0420 03/30/13 1309 03/31/13 1440  PHART  7.311* 7.410 7.426  PCO2ART 35.5 44.3 41.5  PO2ART 91.1 134.0* 75.0*   Liver Enzymes  Recent Labs Lab 03/28/13 0330 03/31/13 0405  AST 38* 19  ALT 32 14  ALKPHOS 80 75  BILITOT 0.6 0.4  ALBUMIN 1.7* 1.7*   Glucose  Recent Labs Lab 04/01/13 2339 04/02/13 0349 04/02/13 0844 04/02/13 1139 04/02/13 1638 04/02/13 1933  GLUCAP 271* 199* 222* 266* 270* 185*    Imaging Dg Chest Port 1 View  04/03/2013   CLINICAL DATA:  Respiratory distress  EXAM: PORTABLE CHEST - 1 VIEW  COMPARISON:  04/01/2013  FINDINGS: The cardiac shadow is stable. The nasogastric catheter is been removed in the interval. A right-sided PICC line is again seen and stable. Tracheostomy catheter is noted 4 cm above the carina. Mild bibasilar atelectatic changes are seen. A small left-sided pleural effusion is noted.  IMPRESSION: Mild bibasilar changes.  Tubes and lines as  described.   Electronically Signed   By: Inez Catalina M.D.   On: 04/03/2013 08:24   ASSESSMENT / PLAN:  PULMONARY A: Acute respiratory failure in setting of ischemic bowel and sepsis.  Re-intubated 2/1 & again 2/5 for stridor ultimately required trach Pleural effusions -resolved. P:   -ATC as tolerated -PM valve -TB toilet -solumedrol given for upper airway edema   CARDIOVASCULAR A: Severe sepsis >> resolved. Hx of HTN. P:  -continue scheduled IV lopressor  RENAL A: Acute kidney injury (baseline creatinine 0.9 from 03/22/13) -improving Non-anion gap metabolic acidosis. Lactic acidosis >> resolved. Hypervolemia. Hypernatremia -rising P:   -monitor renal fx urine outpt. Electrolytes -Increase free water +d5w @ 50/h   GASTROINTESTINAL A: SBO from ischemic bowel >> CT abd findings from 2/01 noted. Protein calorie malnutrition P:   -post-op care, nutrition per CCS-starting TFs, transition off TNA once at goal -protonix for SUP   HEMATOLOGIC A: Anemia of critical illness. P:  -transfuse 1U PRBC once Hb < 7 -lovenox for DVT prevention  INFECTIOUS A: Severe sepsis 2nd to ischemic bowel. P:   -dc cipro/flagyl   ENDOCRINE A: Hyperglycemia. P:   -SSI -resistant scale, sugars high due to steroids,now d5w added, lantus increased to 15 u  NEUROLOGIC A: Post op pain  P:   -PRN fentanyl.  Summary:   Needs  aggressive rehab She was previously DNR status. Proceed with trach collar, can plan for LTAC   CC time 31 minutes.   Kara Mead MD. Shade Flood. [facility] Pager [phone] If no response call [phone]   04/03/2013, 10:48 AM

## 2013-04-04 DIAGNOSIS — E870 Hyperosmolality and hypernatremia: Secondary | ICD-10-CM

## 2013-04-04 LAB — GLUCOSE, CAPILLARY
GLUCOSE-CAPILLARY: 117 mg/dL — AB (ref 70–99)
GLUCOSE-CAPILLARY: 160 mg/dL — AB (ref 70–99)
GLUCOSE-CAPILLARY: 168 mg/dL — AB (ref 70–99)
GLUCOSE-CAPILLARY: 171 mg/dL — AB (ref 70–99)
Glucose-Capillary: 151 mg/dL — ABNORMAL HIGH (ref 70–99)
Glucose-Capillary: 179 mg/dL — ABNORMAL HIGH (ref 70–99)
Glucose-Capillary: 180 mg/dL — ABNORMAL HIGH (ref 70–99)

## 2013-04-04 LAB — CBC WITH DIFFERENTIAL/PLATELET
BASOS ABS: 0 10*3/uL (ref 0.0–0.1)
BASOS PCT: 0 % (ref 0–1)
EOS ABS: 0.3 10*3/uL (ref 0.0–0.7)
Eosinophils Relative: 2 % (ref 0–5)
HEMATOCRIT: 22.6 % — AB (ref 36.0–46.0)
Hemoglobin: 7.2 g/dL — ABNORMAL LOW (ref 12.0–15.0)
Lymphocytes Relative: 7 % — ABNORMAL LOW (ref 12–46)
Lymphs Abs: 1.2 10*3/uL (ref 0.7–4.0)
MCH: 23.7 pg — AB (ref 26.0–34.0)
MCHC: 31.9 g/dL (ref 30.0–36.0)
MCV: 74.3 fL — ABNORMAL LOW (ref 78.0–100.0)
MONO ABS: 1.7 10*3/uL — AB (ref 0.1–1.0)
Monocytes Relative: 10 % (ref 3–12)
Neutro Abs: 13.4 10*3/uL — ABNORMAL HIGH (ref 1.7–7.7)
Neutrophils Relative %: 81 % — ABNORMAL HIGH (ref 43–77)
Platelets: 396 10*3/uL (ref 150–400)
RBC: 3.04 MIL/uL — ABNORMAL LOW (ref 3.87–5.11)
RDW: 21.7 % — AB (ref 11.5–15.5)
WBC: 16.6 10*3/uL — ABNORMAL HIGH (ref 4.0–10.5)

## 2013-04-04 LAB — BASIC METABOLIC PANEL
BUN: 65 mg/dL — AB (ref 6–23)
CALCIUM: 8.1 mg/dL — AB (ref 8.4–10.5)
CO2: 25 mEq/L (ref 19–32)
CREATININE: 1.39 mg/dL — AB (ref 0.50–1.10)
Chloride: 116 mEq/L — ABNORMAL HIGH (ref 96–112)
GFR, EST AFRICAN AMERICAN: 39 mL/min — AB (ref >90)
GFR, EST NON AFRICAN AMERICAN: 33 mL/min — AB (ref >90)
GLUCOSE: 212 mg/dL — AB (ref 70–99)
Potassium: 3.9 mEq/L (ref 3.7–5.3)
Sodium: 152 mEq/L — ABNORMAL HIGH (ref 137–147)

## 2013-04-04 LAB — CLOSTRIDIUM DIFFICILE BY PCR: Toxigenic C. Difficile by PCR: NEGATIVE

## 2013-04-04 LAB — PREALBUMIN: PREALBUMIN: 21 mg/dL (ref 17.0–34.0)

## 2013-04-04 MED ORDER — FREE WATER
200.0000 mL | Status: DC
  Administered 2013-04-04 – 2013-04-11 (×40): 200 mL

## 2013-04-04 NOTE — Progress Notes (Signed)
CSW continuing to follow patient for appropriate dc plans when patient is medically stable.  Jeanette Caprice, MSW, [place]

## 2013-04-04 NOTE — Progress Notes (Signed)
CSW continuing to follow patient for dc needs when patient becomes more medically stable.   Jeanette Caprice, MSW, [place]

## 2013-04-04 NOTE — Progress Notes (Signed)
3 Days Post-Op  Subjective: Awake on vent  Objective: Vital signs in last 24 hours: Temp:  [98.1 F (36.7 C)-100 F (37.8 C)] 98.1 F (36.7 C) (02/09 0720) Pulse Rate:  [82-121] 82 (02/09 0600) Resp:  [18-40] 21 (02/09 0600) BP: (112-165)/(50-111) 136/56 mmHg (02/09 0600) SpO2:  [91 %-100 %] 100 % (02/09 0600) FiO2 (%):  [28 %-30 %] 30 % (02/09 0600) Weight:  [162 lb 0.6 oz (73.5 kg)] 162 lb 0.6 oz (73.5 kg) (02/09 0500) Last BM Date: 04/03/13  Intake/Output from previous day: 02/08 0701 - 02/09 0700 In: 2483.4 [I.V.:1020; NG/GT:1050; TPN:413.4] Out: 4765 [YYTKP:5465; Stool:4] Intake/Output this shift:    General appearance: cooperative Resp: rhonchi B Cardio: regular rate and rhythm GI: soft, incision CDI with staples, PEG site OK, +BS, NT Extremities: decreased edema  Lab Results:   Recent Labs  04/03/13 0830 04/04/13 0354  WBC 19.1* 16.6*  HGB 8.7* 7.2*  HCT 26.4* 22.6*  PLT 475* 396   BMET  Recent Labs  04/03/13 0830 04/04/13 0354  NA 155* 152*  K 3.7 3.9  CL 116* 116*  CO2 27 25  GLUCOSE 206* 212*  BUN 66* 65*  CREATININE 1.40* 1.39*  CALCIUM 8.6 8.1*   PT/INR No results found for this basename: LABPROT, INR,  in the last 72 hours ABG No results found for this basename: PHART, PCO2, PO2, HCO3,  in the last 72 hours  Studies/Results: Dg Chest Port 1 View  04/03/2013   CLINICAL DATA:  Respiratory distress  EXAM: PORTABLE CHEST - 1 VIEW  COMPARISON:  04/01/2013  FINDINGS: The cardiac shadow is stable. The nasogastric catheter is been removed in the interval. A right-sided PICC line is again seen and stable. Tracheostomy catheter is noted 4 cm above the carina. Mild bibasilar atelectatic changes are seen. A small left-sided pleural effusion is noted.  IMPRESSION: Mild bibasilar changes.  Tubes and lines as described.   Electronically Signed   By: Inez Catalina M.D.   On: 04/03/2013 08:24    Anti-infectives: Anti-infectives   Start     Dose/Rate  Route Frequency Ordered Stop   03/24/13 0800  ciprofloxacin (CIPRO) IVPB 400 mg  Status:  Discontinued     400 mg 200 mL/hr over 60 Minutes Intravenous Every 24 hours 03/23/13 1241 04/02/13 0839   03/23/13 1700  metroNIDAZOLE (FLAGYL) IVPB 500 mg  Status:  Discontinued     500 mg 100 mL/hr over 60 Minutes Intravenous Every 8 hours 03/23/13 1236 04/02/13 0839   03/23/13 0730  [MAR Hold]  ciprofloxacin (CIPRO) IVPB 400 mg     (On MAR Hold since 03/23/13 0805)   400 mg 200 mL/hr over 60 Minutes Intravenous On call to O.R. 03/23/13 6812 03/23/13 0824   03/23/13 0730  [MAR Hold]  metroNIDAZOLE (FLAGYL) IVPB 500 mg     (On MAR Hold since 03/23/13 0805)   500 mg 100 mL/hr over 60 Minutes Intravenous On call to O.R. 03/23/13 0728 03/23/13 0910      Assessment/Plan: S/P SBR POD#12 S/P PEG VDRF - S/P trach by CCM, weaning, appreciate their assistance ID - off ABX, WBC down some, no sig fever, if fever or WBC does not normalize will need CXs/possible CT A/P Deconditioned - will need PT/OT FEN - TF via PEG, increase free water for hypernatremia VTE - Lovenox Dispo - ICU   LOS: 13 days    Kristi Alexander 04/04/2013

## 2013-04-04 NOTE — Progress Notes (Signed)
Physical Therapy Treatment Patient Details Name: Kristi Alexander MRN: [id number] DOB: 1925/04/24 Today's Date: 04/04/2013 Time: 0630-1601 PT Time Calculation (min): 24 min  PT Assessment / Plan / Recommendation  History of Present Illness Pt s/p exp. lap and resection of 4 foot section of necrotic bowel.  pt back to room intubated.  Extubated 1/29.   PT Comments   Progressing well given this is first attempt at mobility in RW post trach.  Pt's WOB up and sats dropped when she went from tc on 28% to RA for a short period of time when outside the reach of the oxygen tubing  Follow Up Recommendations  SNF     Does the patient have the potential to tolerate intense rehabilitation     Barriers to Discharge        Equipment Recommendations   (TBA post acute)    Recommendations for Other Services    Frequency Min 3X/week   Progress towards PT Goals Progress towards PT goals: Progressing toward goals  Plan Current plan remains appropriate    Precautions / Restrictions Precautions Precautions: Fall Restrictions Weight Bearing Restrictions: No   Pertinent Vitals/Pain HR in the 110's/120's bpm; sats waveform not clean, but likely sats dropped into the low 80's;  100% at rest on 28% TC    Mobility  Bed Mobility Overal bed mobility: Needs Assistance Bed Mobility: Supine to Sit Supine to sit: Mod assist General bed mobility comments: Bridged well to EOB and needed truncal assist to fully sit up. Transfers Overall transfer level: Needs assistance Transfers: Sit to/from Stand Sit to Stand: Mod assist General transfer comment: cues for transfer safety, assist to come forward and up. Ambulation/Gait Ambulation/Gait assistance: Mod assist;+2 safety/equipment Ambulation Distance (Feet): 3 Feet (forward and 5 feet back to chair with RW) Assistive device: Rolling walker (2 wheeled) Gait Pattern/deviations: Step-through pattern;Decreased step length - right;Decreased step length - left;Wide  base of support    Exercises     PT Diagnosis:    PT Problem List:   PT Treatment Interventions:     PT Goals (current goals can now be found in the care plan section) Acute Rehab PT Goals Patient Stated Goal: 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 lap and resection of 4 foot section of necrotic bowel.  pt back to room intubated.  Extubated 1/29.    Subjective Data  Subjective: pt relates that she is worn out after session Patient Stated Goal: 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   Cognition  Cognition Arousal/Alertness: Awake/alert Behavior During Therapy: WFL for tasks assessed/performed Overall Cognitive Status: Within Functional Limits for tasks assessed    Balance  Balance Overall balance assessment: Needs assistance Sitting-balance support: No upper extremity supported;Single extremity supported;Feet supported Sitting balance-Leahy Scale: Good (to fair didn't challenge her much EOB due to breathing rate) Standing balance support: Bilateral upper extremity supported Standing balance-Leahy Scale: Fair Standing balance comment: tending to want to list posteriorly  End of Session PT - End of Session Equipment Utilized During Treatment: Oxygen Activity Tolerance: Patient tolerated treatment well;Patient limited by fatigue Patient left: in chair;with call bell/phone within reach;with nursing/sitter in room Nurse Communication: Mobility status   GP  Loxley Cibrian, [name] 04/04/2013, 12:16 PM

## 2013-04-04 NOTE — Progress Notes (Signed)
Speech Language Pathology Treatment: Nada Boozer Speaking valve  Patient Details Name: Kristi Alexander MRN: [id number] DOB: 14-Jul-1925 Today's Date: 04/04/2013 Time: 8206-0156 SLP Time Calculation (min): 18 min  Assessment / Plan / Recommendation Clinical Impression  PMSV placed by SLP following cuff deflation. Vocal quality initially wet with secretions along vocal cords however patient with excellent management of moderate, thick secretions, clearing orally with hard cough response. SLP provided moderate-max verbal and tactile cueing for deep breaths during conversation to increase loudness and intelligibility given severely decreased breath support. Overall, patient with excellent tolerance of valve, maintaining all vital signs and without evidence of CO2 trapping however fatigues quickly with goal to remain off vent for as long as tolerating during waking hours. Recommend continuing PMSV use with full SLP, RT, or RN supervision for brief 15 minute increments to build strength. SLP will continue to f/u. Consider swallow evaluation as long as patient continues to progress.    HPI HPI: 80 yo female adm to [facility] 03/22/13 with SBO, pt is s/p surgery, trach #6 Shiley cuffed 04/01/13 and PEG placement.  She had VDRF and had respiratory arrest last Sunday due to mucus plug per SLP conversation with RN.  Order for PMSV received- RN phoned respiratory therapy and requested their participation to deflate cuff prior to testing.        SLP Plan  Continue with current plan of care (consider swallow eval as long as patient continues to progre)    Recommendations        Patient may use Passy-Muir Speech Valve: Intermittently with supervision PMSV Supervision: Full       General recommendations: Rehab consult Oral Care Recommendations: Oral care Q4 per protocol Follow up Recommendations: Inpatient Rehab Plan: Continue with current plan of care (consider swallow eval as long as patient continues to progre)     [place], CCC-SLP [phone]   Meriam Chojnowski Meryl 04/04/2013, 3:21 PM

## 2013-04-04 NOTE — Progress Notes (Signed)
Name: Kristi Alexander MRN: [id number] DOB: [birth year]    ADMISSION DATE:  03/22/2013 CONSULTATION DATE:  1/28  REFERRING MD :  Dr Hulen Skains PRIMARY SERVICE: CCS  CHIEF COMPLAINT:  Vent and medical management   BRIEF PATIENT DESCRIPTION: 80 yo female with hx HTN admitted 1/27 with SBO.  Initially managed conservatively but cont to have worsening abd pain and increased WBC and was taken to OR 1/28 for exp lap and bowel resection.  Remained on vent post op and PCCM consulted.   SIGNIFICANT EVENTS / STUDIES:  1/27 admitted with SBO, conservative management  1/28 necrotic bowel suspected, to OR, 3-4 feet frankly necrotic bowel removed (no perforation)  2/01 Re-intubated; CT abd >> possible adhesions causing obstruction of SB, colon thickening ?infection or ischemia 2/08 On vent overnight ? Mucus plug 2/09 Off vent  LINES / TUBES: ETT 1/28>>>1/29; 2/1>>> 2/5, 2/5 >>2/6 Trach 2/6 >> L brachial aline 1/28>>> out Rt PICC 2/1 >>  CULTURES: Urine 1/28>>> negative  ANTIBIOTICS: Flagyl 1/28>>>2/7 Cipro 1/29 >> 2/7  SUBJECTIVE:  Had increased respiratory secretions while using PM valve 2/08 >> needed vent overnight.  Still has chest congestion.  Denies chest pain.  VITAL SIGNS: Temp:  [98.1 F (36.7 C)-100 F (37.8 C)] 98.1 F (36.7 C) (02/09 0720) Pulse Rate:  [82-109] 90 (02/09 0851) Resp:  [18-40] 25 (02/09 0851) BP: (112-160)/(50-111) 138/57 mmHg (02/09 0851) SpO2:  [99 %-100 %] 100 % (02/09 0851) FiO2 (%):  [28 %-30 %] 28 % (02/09 0851) Weight:  [162 lb 0.6 oz (73.5 kg)] 162 lb 0.6 oz (73.5 kg) (02/09 0500) TC 28%  INTAKE / OUTPUT: Intake/Output     02/08 0701 - 02/09 0700 02/09 0701 - 02/10 0700   I.V. (mL/kg) 1020 (13.9)    NG/GT 1050    TPN 413.4    Total Intake(mL/kg) 2483.4 (33.8)    Urine (mL/kg/hr) 1635 (0.9)    Stool 4 (0)    Total Output 1639     Net +844.4            PHYSICAL EXAMINATION: General:  No distress Neuro:  Awake, follows commands HEENT:  trach site clean Cardiovascular: regular Lungs: scattered rhonchi, no wheeze Abdomen:  Wound dressing clean Musculoskeletal: ankle edema  LABS:  CBC  Recent Labs Lab 04/02/13 0459 04/03/13 0830 04/04/13 0354  WBC 19.2* 19.1* 16.6*  HGB 7.5* 8.7* 7.2*  HCT 22.7* 26.4* 22.6*  PLT 404* 475* 396   Coag's  Recent Labs Lab 04/01/13 0415  APTT 31  INR 1.02   BMET  Recent Labs Lab 04/02/13 0459 04/03/13 0830 04/04/13 0354  NA 149* 155* 152*  K 4.0 3.7 3.9  CL 112 116* 116*  CO2 27 27 25   BUN 72* 66* 65*  CREATININE 1.68* 1.40* 1.39*  GLUCOSE 250* 206* 212*   Electrolytes  Recent Labs Lab 03/29/13 0500 03/30/13 0355  03/31/13 0405  04/02/13 0459 04/03/13 0830 04/04/13 0354  CALCIUM 7.8* 8.0*  < > 8.0*  < > 8.7 8.6 8.1*  MG 1.5 1.9  --  2.1  --  1.7  --   --   PHOS 3.2 3.2  --  3.3  --   --   --   --   < > = values in this interval not displayed.  ABG  Recent Labs Lab 03/30/13 1309 03/31/13 1440  PHART 7.410 7.426  PCO2ART 44.3 41.5  PO2ART 134.0* 75.0*   Liver Enzymes  Recent Labs Lab 03/31/13 0405  AST  19  ALT 14  ALKPHOS 75  BILITOT 0.4  ALBUMIN 1.7*   Glucose  Recent Labs Lab 04/03/13 0340 04/03/13 0729 04/03/13 1129 04/03/13 1610 04/03/13 2004 04/04/13 0717  GLUCAP 179* 159* 139* 157* 148* 180*    Imaging Dg Chest Port 1 View  04/03/2013   CLINICAL DATA:  Respiratory distress  EXAM: PORTABLE CHEST - 1 VIEW  COMPARISON:  04/01/2013  FINDINGS: The cardiac shadow is stable. The nasogastric catheter is been removed in the interval. A right-sided PICC line is again seen and stable. Tracheostomy catheter is noted 4 cm above the carina. Mild bibasilar atelectatic changes are seen. A small left-sided pleural effusion is noted.  IMPRESSION: Mild bibasilar changes.  Tubes and lines as described.   Electronically Signed   By: Inez Catalina M.D.   On: 04/03/2013 08:24   ASSESSMENT / PLAN:  PULMONARY A: Acute respiratory failure in  setting of ischemic bowel and sepsis.  Re-intubated 2/1 & again 2/5 for stridor ultimately required trach >> completed course of steroids for stridor. Pleural effusions >> resolved. P:   -ATC as tolerated -PM valve as tolerated >> respiratory secretions may be an issue -bronchial hygiene -f/u CXR intermittently -prn xopenex  CARDIOVASCULAR A: Severe sepsis >> resolved. Hx of HTN. P:  -continue scheduled IV lopressor  RENAL A: Acute kidney injury (baseline creatinine 0.9 from 03/22/13) >> improving. Non-anion gap metabolic acidosis, lactic acidosis >> resolved. Hypervolemia >> improving. Hypernatremia. P:   -continue D5W at 50 ml/hr, free water tube flushes -f/u renal fx, urine outpt, electrolytes  GASTROINTESTINAL A: SBO from ischemic bowel. Protein calorie malnutrition. P:   -post-op care per CCS -continue tube feeds per CCS -?if she could have swallow evaluation at some point in near future -protonix for SUP  HEMATOLOGIC A: Anemia of critical illness. P:  -f/u CBC >> transfuse for Hb < 7 -lovenox for DVT prevention  INFECTIOUS A: Severe sepsis 2nd to ischemic bowel >> resolved. P:   -monitor clinically  ENDOCRINE A: Hyperglycemia. P:   -SSI with lantus 15 units daily  NEUROLOGIC A: Post op pain. Deconditioning P:   -PRN fentanyl. -will likely need LTAC placement  Goals of care >> limited code >> no CPR, no defibrillation.  Updated family at bedside.  Chesley Mires, MD [facility] Pulmonary/Critical Care 04/04/2013, 10:55 AM Pager:  [phone] After 3pm call: [phone]

## 2013-04-04 NOTE — Progress Notes (Signed)
In to see patient at this time, patient in resp distress and bradying down to rates in the 30-40's and 02 sat decreased to 80's. Patient immediately inline suctioned, a large thick tan copious clot removed via inline catheter. Inner cannula replaced at this time. Patient placed back on vent full support. RT called and at bedside as well. S/P episode patient HR SR 80-90's, 02 sats 99%, BP 134/53. Patient in no noted respiratory distress. Will continue to monitor the patient closely.

## 2013-04-05 ENCOUNTER — Inpatient Hospital Stay (HOSPITAL_COMMUNITY): Payer: Medicare Other

## 2013-04-05 ENCOUNTER — Encounter (HOSPITAL_COMMUNITY): Payer: Self-pay | Admitting: General Surgery

## 2013-04-05 LAB — IRON AND TIBC
Iron: 24 ug/dL — ABNORMAL LOW (ref 42–135)
Saturation Ratios: 14 % — ABNORMAL LOW (ref 20–55)
TIBC: 177 ug/dL — ABNORMAL LOW (ref 250–470)
UIBC: 153 ug/dL (ref 125–400)

## 2013-04-05 LAB — CBC
HCT: 18.1 % — ABNORMAL LOW (ref 36.0–46.0)
HCT: 19.1 % — ABNORMAL LOW (ref 36.0–46.0)
HCT: 24.8 % — ABNORMAL LOW (ref 36.0–46.0)
HEMOGLOBIN: 8.3 g/dL — AB (ref 12.0–15.0)
Hemoglobin: 5.8 g/dL — CL (ref 12.0–15.0)
Hemoglobin: 6.1 g/dL — CL (ref 12.0–15.0)
MCH: 24.1 pg — ABNORMAL LOW (ref 26.0–34.0)
MCH: 23.7 pg — AB (ref 26.0–34.0)
MCH: 25.9 pg — AB (ref 26.0–34.0)
MCHC: 32 g/dL (ref 30.0–36.0)
MCHC: 31.9 g/dL (ref 30.0–36.0)
MCHC: 33.5 g/dL (ref 30.0–36.0)
MCV: 75.1 fL — ABNORMAL LOW (ref 78.0–100.0)
MCV: 74.3 fL — ABNORMAL LOW (ref 78.0–100.0)
MCV: 77.3 fL — ABNORMAL LOW (ref 78.0–100.0)
PLATELETS: 373 10*3/uL (ref 150–400)
PLATELETS: 354 10*3/uL (ref 150–400)
Platelets: 400 10*3/uL (ref 150–400)
RBC: 2.41 MIL/uL — ABNORMAL LOW (ref 3.87–5.11)
RBC: 2.57 MIL/uL — AB (ref 3.87–5.11)
RBC: 3.21 MIL/uL — AB (ref 3.87–5.11)
RDW: 22.1 % — ABNORMAL HIGH (ref 11.5–15.5)
RDW: 22 % — ABNORMAL HIGH (ref 11.5–15.5)
RDW: 21.2 % — ABNORMAL HIGH (ref 11.5–15.5)
WBC: 12.1 10*3/uL — AB (ref 4.0–10.5)
WBC: 11.5 10*3/uL — ABNORMAL HIGH (ref 4.0–10.5)
WBC: 14 10*3/uL — ABNORMAL HIGH (ref 4.0–10.5)

## 2013-04-05 LAB — GLUCOSE, CAPILLARY
GLUCOSE-CAPILLARY: 124 mg/dL — AB (ref 70–99)
GLUCOSE-CAPILLARY: 129 mg/dL — AB (ref 70–99)
Glucose-Capillary: 109 mg/dL — ABNORMAL HIGH (ref 70–99)
Glucose-Capillary: 109 mg/dL — ABNORMAL HIGH (ref 70–99)
Glucose-Capillary: 123 mg/dL — ABNORMAL HIGH (ref 70–99)
Glucose-Capillary: 124 mg/dL — ABNORMAL HIGH (ref 70–99)
Glucose-Capillary: 125 mg/dL — ABNORMAL HIGH (ref 70–99)
Glucose-Capillary: 153 mg/dL — ABNORMAL HIGH (ref 70–99)

## 2013-04-05 LAB — BASIC METABOLIC PANEL
BUN: 44 mg/dL — ABNORMAL HIGH (ref 6–23)
CHLORIDE: 106 meq/L (ref 96–112)
CO2: 25 mEq/L (ref 19–32)
Calcium: 7.2 mg/dL — ABNORMAL LOW (ref 8.4–10.5)
Creatinine, Ser: 1.03 mg/dL (ref 0.50–1.10)
GFR, EST AFRICAN AMERICAN: 55 mL/min — AB (ref >90)
GFR, EST NON AFRICAN AMERICAN: 48 mL/min — AB (ref >90)
Glucose, Bld: 466 mg/dL — ABNORMAL HIGH (ref 70–99)
Potassium: 3.3 mEq/L — ABNORMAL LOW (ref 3.7–5.3)
Sodium: 140 mEq/L (ref 137–147)

## 2013-04-05 LAB — FERRITIN: Ferritin: 131 ng/mL (ref 10–291)

## 2013-04-05 LAB — PREPARE RBC (CROSSMATCH)

## 2013-04-05 MED ORDER — METOPROLOL TARTRATE 25 MG/10 ML ORAL SUSPENSION
25.0000 mg | Freq: Two times a day (BID) | ORAL | Status: DC
  Administered 2013-04-05 – 2013-04-11 (×14): 25 mg
  Filled 2013-04-05 (×16): qty 10

## 2013-04-05 MED ORDER — CHLORHEXIDINE GLUCONATE 0.12 % MT SOLN
15.0000 mL | Freq: Two times a day (BID) | OROMUCOSAL | Status: DC
  Administered 2013-04-05 – 2013-04-19 (×29): 15 mL via OROMUCOSAL
  Filled 2013-04-05 (×30): qty 15

## 2013-04-05 MED ORDER — PANTOPRAZOLE SODIUM 40 MG PO PACK
40.0000 mg | PACK | Freq: Every day | ORAL | Status: DC
  Administered 2013-04-05 – 2013-04-14 (×10): 40 mg
  Filled 2013-04-05 (×10): qty 20

## 2013-04-05 MED ORDER — POTASSIUM CHLORIDE 10 MEQ/50ML IV SOLN
10.0000 meq | INTRAVENOUS | Status: AC
  Administered 2013-04-05 (×4): 10 meq via INTRAVENOUS
  Filled 2013-04-05 (×4): qty 50

## 2013-04-05 NOTE — Progress Notes (Signed)
04/05/13 0900 placed patient of trach collar 5L 28% per Dr. Halford Chessman Orders sats 98 . Patient is stable Rt will continue to monitor.

## 2013-04-05 NOTE — Progress Notes (Signed)
Patient ID: Kristi Alexander, female   DOB: 1926/01/31, 80 y.o.   MRN: [id number] 4 Days Post-Op  Subjective: Pt without complaints. On vent.  No belly pain.  Got plugged up last night and desated and brady down.  Mucous plug suctioned and patient normalized.   Objective: Vital signs in last 24 hours: Temp:  [98 F (36.7 C)-99.4 F (37.4 C)] 98 F (36.7 C) (02/10 0710) Pulse Rate:  [77-102] 92 (02/10 0732) Resp:  [18-38] 28 (02/10 0732) BP: (117-144)/(42-100) 130/42 mmHg (02/10 0700) SpO2:  [95 %-100 %] 100 % (02/10 0732) FiO2 (%):  [28 %-30 %] 30 % (02/10 0732) Weight:  [163 lb 9.3 oz (74.2 kg)] 163 lb 9.3 oz (74.2 kg) (02/10 0600) Last BM Date: 04/04/13  Intake/Output from previous day: 02/09 0701 - 02/10 0700 In: 2400 [I.V.:1200; NG/GT:1200] Out: 1681 [Urine:1680; Stool:1] Intake/Output this shift:    PE: Abd: soft, g-tube in place with tube feeds running.  +BS, ND, incision c/d/i with staples Heart: regular  Lab Results:   Recent Labs  04/05/13 0410 04/05/13 0530  WBC 12.1* 11.5*  HGB 5.8* 6.1*  HCT 18.1* 19.1*  PLT 373 354   BMET  Recent Labs  04/04/13 0354 04/05/13 0410  NA 152* 140  K 3.9 3.3*  CL 116* 106  CO2 25 25  GLUCOSE 212* 466*  BUN 65* 44*  CREATININE 1.39* 1.03  CALCIUM 8.1* 7.2*   PT/INR No results found for this basename: LABPROT, INR,  in the last 72 hours CMP     Component Value Date/Time   NA 140 04/05/2013 0410   K 3.3* 04/05/2013 0410   CL 106 04/05/2013 0410   CO2 25 04/05/2013 0410   GLUCOSE 466* 04/05/2013 0410   BUN 44* 04/05/2013 0410   CREATININE 1.03 04/05/2013 0410   CALCIUM 7.2* 04/05/2013 0410   PROT 4.9* 03/31/2013 0405   ALBUMIN 1.7* 03/31/2013 0405   AST 19 03/31/2013 0405   ALT 14 03/31/2013 0405   ALKPHOS 75 03/31/2013 0405   BILITOT 0.4 03/31/2013 0405   GFRNONAA 48* 04/05/2013 0410   GFRAA 55* 04/05/2013 0410   Lipase  No results found for this basename: lipase       Studies/Results: No results  found.  Anti-infectives: Anti-infectives   Start     Dose/Rate Route Frequency Ordered Stop   03/24/13 0800  ciprofloxacin (CIPRO) IVPB 400 mg  Status:  Discontinued     400 mg 200 mL/hr over 60 Minutes Intravenous Every 24 hours 03/23/13 1241 04/02/13 0839   03/23/13 1700  metroNIDAZOLE (FLAGYL) IVPB 500 mg  Status:  Discontinued     500 mg 100 mL/hr over 60 Minutes Intravenous Every 8 hours 03/23/13 1236 04/02/13 0839   03/23/13 0730  [MAR Hold]  ciprofloxacin (CIPRO) IVPB 400 mg     (On MAR Hold since 03/23/13 0805)   400 mg 200 mL/hr over 60 Minutes Intravenous On call to O.R. 03/23/13 4098 03/23/13 0824   03/23/13 0730  [MAR Hold]  metroNIDAZOLE (FLAGYL) IVPB 500 mg     (On MAR Hold since 03/23/13 0805)   500 mg 100 mL/hr over 60 Minutes Intravenous On call to O.R. 03/23/13 0728 03/23/13 0910       Assessment/Plan  S/P SBR POD#13, will dc staples today and place steri-strips S/P PEG, getting TFs at goal rate.  Good bowel function VDRF - S/P trach by CCM, weaning, appreciate their assistance  ID - off ABX, WBC down some, no sig fever  Deconditioned - will need PT/OT  FEN - TF via PEG, hypokalemia, K+ replaced VTE - Lovenox, on hold due to hgb of 6.1 this morning.  CCM wrote for one unit of pRBCs today.  Recheck labs in am.  No evidence of bleeding. Dispo - ICU   LOS: 14 days    Tynan Boesel E 04/05/2013, 8:21 AM Pager: [phone]

## 2013-04-05 NOTE — Clinical Social Work Psychosocial (Signed)
Assessment note reviewed and approved.  Bridgitt Raggio Givens, MSW, LCSW [phone]

## 2013-04-05 NOTE — Clinical Social Work Psychosocial (Signed)
Clinical Social Work Department BRIEF PSYCHOSOCIAL ASSESSMENT 04/05/2013  Patient:  Kristi Alexander, Kristi Alexander     Account Number:  [id number]     Admit date:  03/22/2013  Clinical Social Worker:  Frederico Hamman  Date/Time:  04/05/2013 03:00 PM  Referred by:  Physician  Date Referred:  03/25/2013 Referred for  SNF Placement   Other Referral:   Interview type:  Patient Other interview type:   Patient has a husband, Kristi Alexander [phone] and daughter, Kristi Alexander [phone].    PSYCHOSOCIAL DATA Living Status:  ALONE Admitted from facility:   Level of care:   Primary support name:  Kristi Alexander Primary support relationship to patient:  CHILD, ADULT Degree of support available:   Patient daughter, Kristi Alexander was absent when [place] intern visited room. Kristi Alexander mentioned that she visits every morning and afternoon.    CURRENT CONCERNS Current Concerns  Post-Acute Placement   Other Concerns:    SOCIAL WORK ASSESSMENT / PLAN CSW intern spoke with patient's nurse. Patient's nurse advised CSW intern that patient was not a candidate for LTACH due to the patient's insurance. Patient's nurse also advised CSW intern that patient will most likely be off vent by discharge. CSW intern introduce self to patient and explained SNF process and discharge plans. Patient is agreeable to SNF and will be faxed out to [facility]. CSW intern contacted daughter, Kristi Alexander about discharge plans. Kristi Alexander was informed by [place] intern that [facility] could not make an offer due to her insurance. CSW will follow up tomorrow with Kristi Alexander about bed offers and decision.   Assessment/plan status:  Psychosocial Support/Ongoing Assessment of Needs Other assessment/ plan:   Information/referral to community resources:    PATIENT'S/FAMILY'S RESPONSE TO PLAN OF CARE: Patient seem content. Patient's daughter Kristi Alexander seem very concerned about discharge plans. Kristi Alexander will follow up with CSW tomorrow about placement for  patient.       Wellington Hampshire , CSW Intern.

## 2013-04-05 NOTE — Progress Notes (Signed)
Speech Language Pathology Treatment: Nada Boozer Speaking valve  Patient Details Name: CLARIS PECH MRN: [id number] DOB: 02/19/1926 Today's Date: 04/05/2013 Time: 5852-7782 SLP Time Calculation (min): 28 min  Assessment / Plan / Recommendation Clinical Impression  PMSV placed by SLP following cuff deflation. Vocal quality initially weak; phonation wet; pt with excellent oral clearance, strong cough, and use of oral suctioning independently to remove secretions.  Min verbal cueing for increased respiratory support during conversation.  As session progressed, quality of phonation improved.  Vital signs remained stable throughout nearly 30 minute usage.   Improved respiratory support and volume today.   Recommend increasing use of PMSV with frequent RN supervision. SLP will continue to f/u. Consider swallow evaluation as long as patient continues to progress.     HPI HPI: 80 yo female adm to [facility] 03/22/13 with SBO, pt is s/p surgery, trach #6 Shiley cuffed 04/01/13 and PEG placement.  She had VDRF and had respiratory arrest last Sunday due to mucus plug per SLP conversation with RN.  Order for PMSV received- RN phoned respiratory therapy and requested their participation to deflate cuff prior to testing.        SLP Plan  Continue with current plan of care    Recommendations        Patient may use Passy-Muir Speech Valve: Intermittently with supervision PMSV Supervision: Intermittent       Oral Care Recommendations: Oral care Q4 per protocol Follow up Recommendations: Inpatient Rehab Plan: Continue with current plan of care    France Lusty L. Tivis Ringer, [place] CCC/SLP Pager [phone]      Juan Quam Laurice 04/05/2013, 4:31 PM

## 2013-04-05 NOTE — Progress Notes (Signed)
Patient placed on trach collar 5L 28% sats 98% patient is stable. RT will continue to monitor

## 2013-04-05 NOTE — Progress Notes (Addendum)
NUTRITION FOLLOW UP  Intervention:    Continue current TF regimen RD to follow for nutrition care plan  Nutrition Dx:   Inadequate oral intake related to inability to eat as evidenced by NPO status, ongoing  Goal:   Pt to meet >/= 90% of their estimated nutrition needs, met  Monitor:   TF regimen & tolerance, PO diet advancement, respiratory status, weight, labs, I/O's  Assessment:   80 year old female admitted with SBO s/p ex lap and bowel resection on 1/28. She is to on TPN for prolonged ileus. Intubated 2/1 for acute metabolic acidosis. CT 2/1: expected post po changes. Failed extubated on 2/5.   Patient s/p procedures 2/6: PERCUTANEOUS ENDOSCOPIC GASTROSTOMY TUBE  Patient is currently on ventilator support -- trach MV: 8.1 L/min Temp (24hrs), Avg:98.6 F (37 C), Min:98 F (36.7 C), Max:99.4 F (37.4 C)   TPN discontinued 2/8.  Vital AF 1.2 formula currently infusing at 50 ml/hr via PEG tube providing 1440 kcal, 90 grams of protein, and 973 ml of free water.  Tolerating well per RN.  + diarrhea.  Free water flushes at 200 ml every 4 hours.  Speech Therapy following for PMSV.  Height: Ht Readings from Last 1 Encounters:  03/23/13 5' 2"  (1.575 m)    Weight Status: ---> stable  2/11  164 lb (74.5 kg) 2/10  163 lb 2/09  162 lb 2/08  160 lb 2/05  160 lb 2/03  158 lb  Re-estimated needs:  Kcal: 1400-1550 Protein: 80-90 grams Fluid: >/= 1.5 L  Skin: abdominal incision, skin tear on lip  Diet Order: NPO   Intake/Output Summary (Last 24 hours) at 04/05/13 1208 Last data filed at 04/05/13 1100  Gross per 24 hour  Intake 2319.67 ml  Output   1580 ml  Net 739.67 ml    Labs:   Recent Labs Lab 03/30/13 0355  03/31/13 0405  04/02/13 0459 04/03/13 0830 04/04/13 0354 04/05/13 0410  NA 144  < > 146  < > 149* 155* 152* 140  K 3.1*  < > 3.5*  < > 4.0 3.7 3.9 3.3*  CL 106  < > 108  < > 112 116* 116* 106  CO2 26  < > 27  < > 27 27 25 25   BUN 74*  < > 70*  <  > 72* 66* 65* 44*  CREATININE 2.73*  < > 2.25*  < > 1.68* 1.40* 1.39* 1.03  CALCIUM 8.0*  < > 8.0*  < > 8.7 8.6 8.1* 7.2*  MG 1.9  --  2.1  --  1.7  --   --   --   PHOS 3.2  --  3.3  --   --   --   --   --   GLUCOSE 170*  < > 174*  < > 250* 206* 212* 466*  < > = values in this interval not displayed.  CBG (last 3)   Recent Labs  04/04/13 1606 04/04/13 2203 04/05/13 0708  GLUCAP 168* 124* 124*    Scheduled Meds: . chlorhexidine  15 mL Mouth/Throat BID  . etomidate  40 mg Intravenous Once  . free water  200 mL Per Tube Q4H  . insulin aspart  0-20 Units Subcutaneous Q4H  . insulin glargine  15 Units Subcutaneous Daily  . metoprolol tartrate  25 mg Per Tube BID  . pantoprazole sodium  40 mg Per Tube Daily  . potassium chloride  10 mEq Intravenous Q1 Hr x 4  .  sodium chloride  10-40 mL Intracatheter Q12H    Continuous Infusions: . sodium chloride Stopped (04/03/13 1300)  . feeding supplement (VITAL AF 1.2 CAL) 1,000 mL (04/04/13 0600)    Arthur Holms, RD, LDN Pager #: [phone] After-Hours Pager #: [phone]

## 2013-04-05 NOTE — Progress Notes (Signed)
eLink Physician-Brief Progress Note Patient Name: Kristi Alexander DOB: 1925-08-15 MRN: [id number]  Date of Service  04/05/2013   HPI/Events of Note   Hgb 6.1  eICU Interventions  One unit RBCs ordered   Intervention Category Intermediate Interventions: Bleeding - evaluation and treatment with blood products  Merton Border 04/05/2013, 6:15 AM

## 2013-04-05 NOTE — Progress Notes (Signed)
Name: Kristi Alexander MRN: [id number] DOB: 09-03-1925    ADMISSION DATE:  03/22/2013 CONSULTATION DATE:  1/28  REFERRING MD :  Dr Hulen Skains PRIMARY SERVICE: CCS  CHIEF COMPLAINT:  Vent and medical management   BRIEF PATIENT DESCRIPTION: 80 yo female with hx HTN admitted 1/27 with SBO.  Initially managed conservatively but cont to have worsening abd pain and increased WBC and was taken to OR 1/28 for exp lap and bowel resection.  Remained on vent post op and PCCM consulted.   SIGNIFICANT EVENTS / STUDIES:  1/27 admitted with SBO, conservative management  1/28 necrotic bowel suspected, to OR, 3-4 feet frankly necrotic bowel removed (no perforation)  2/01 Re-intubated; CT abd >> possible adhesions causing obstruction of SB, colon thickening ?infection or ischemia 2/08 On vent overnight ? Mucus plug 2/09 Off vent 2/10 on vent overnight >> mucus plug, transfuse 1 unit PRBC  LINES / TUBES: ETT 1/28>>>1/29; 2/1>>> 2/5, 2/5 >>2/6 Trach 2/6 >> L brachial aline 1/28>>> out Rt PICC 2/1 >>  CULTURES: Urine 1/28>>> negative  ANTIBIOTICS: Flagyl 1/28>>>2/7 Cipro 1/29 >> 2/7  SUBJECTIVE:  Developed respiratory distress last night, and placed on vent >> noted to have mucus plug when suctioned, and respiratory status improved.  VITAL SIGNS: Temp:  [98 F (36.7 C)-99.4 F (37.4 C)] 98 F (36.7 C) (02/10 0710) Pulse Rate:  [77-102] 92 (02/10 0732) Resp:  [18-38] 28 (02/10 0732) BP: (117-144)/(42-100) 130/42 mmHg (02/10 0700) SpO2:  [95 %-100 %] 100 % (02/10 0732) FiO2 (%):  [28 %-30 %] 30 % (02/10 0732) Weight:  [163 lb 9.3 oz (74.2 kg)] 163 lb 9.3 oz (74.2 kg) (02/10 0600)   INTAKE / OUTPUT: Intake/Output     02/09 0701 - 02/10 0700 02/10 0701 - 02/11 0700   I.V. (mL/kg) 1200 (16.2)    NG/GT 1200    TPN     Total Intake(mL/kg) 2400 (32.3)    Urine (mL/kg/hr) 1680 (0.9)    Stool 1 (0)    Total Output 1681     Net +719            PHYSICAL EXAMINATION: General:  No  distress Neuro:  Awake, follows commands HEENT: trach site clean Cardiovascular: regular Lungs: scattered rhonchi, no wheeze Abdomen:  Wound dressing clean Musculoskeletal: no edema  LABS:  CBC  Recent Labs Lab 04/04/13 0354 04/05/13 0410 04/05/13 0530  WBC 16.6* 12.1* 11.5*  HGB 7.2* 5.8* 6.1*  HCT 22.6* 18.1* 19.1*  PLT 396 373 354   Coag's  Recent Labs Lab 04/01/13 0415  APTT 31  INR 1.02   BMET  Recent Labs Lab 04/03/13 0830 04/04/13 0354 04/05/13 0410  NA 155* 152* 140  K 3.7 3.9 3.3*  CL 116* 116* 106  CO2 27 25 25   BUN 66* 65* 44*  CREATININE 1.40* 1.39* 1.03  GLUCOSE 206* 212* 466*   Electrolytes  Recent Labs Lab 03/30/13 0355  03/31/13 0405  04/02/13 0459 04/03/13 0830 04/04/13 0354 04/05/13 0410  CALCIUM 8.0*  < > 8.0*  < > 8.7 8.6 8.1* 7.2*  MG 1.9  --  2.1  --  1.7  --   --   --   PHOS 3.2  --  3.3  --   --   --   --   --   < > = values in this interval not displayed.  ABG  Recent Labs Lab 03/30/13 1309 03/31/13 1440  PHART 7.410 7.426  PCO2ART 44.3 41.5  PO2ART 134.0*  75.0*   Liver Enzymes  Recent Labs Lab 03/31/13 0405  AST 19  ALT 14  ALKPHOS 75  BILITOT 0.4  ALBUMIN 1.7*   Glucose  Recent Labs Lab 04/04/13 0356 04/04/13 0717 04/04/13 1137 04/04/13 1606 04/04/13 2203 04/05/13 0708  GLUCAP 171* 180* 117* 168* 124* 124*    Imaging No results found.  ASSESSMENT / PLAN:  PULMONARY A: Acute respiratory failure in setting of ischemic bowel and sepsis.  Re-intubated 2/1 & again 2/5 for stridor ultimately required trach >> completed course of steroids for stridor. Pleural effusions >> resolved. Mucus plugging P:   -ATC as tolerated -PM valve as tolerated >> respiratory secretions may be an issue -bronchial hygiene -f/u CXR 2/10 -prn xopenex  CARDIOVASCULAR A: Severe sepsis >> resolved. Hx of HTN. P:  -change to lopressor per tube 2/10  RENAL A: Acute kidney injury (baseline creatinine 0.9  from 03/22/13) >> improving. Non-anion gap metabolic acidosis, lactic acidosis >> resolved. Hypervolemia >> improved. Hypernatremia >> improved. Hypokalemia. P:   -discontinue D5W 2/10 -continue free water tube flushes -f/u renal fx, urine outpt, electrolytes  GASTROINTESTINAL A: SBO from ischemic bowel. Protein calorie malnutrition. P:   -post-op care per CCS -continue tube feeds per CCS -?if she could have swallow evaluation at some point in near future -protonix for SUP  HEMATOLOGIC A: Anemia of critical illness >> no obvious sites for bleeding. P:  -f/u CBC >> transfuse for Hb < 7; getting transfusion 1 unit PRBC 2/10 -lovenox for DVT prevention -check anemia panel  INFECTIOUS A: Severe sepsis 2nd to ischemic bowel >> resolved. P:   -monitor clinically  ENDOCRINE A: Hyperglycemia. P:   -SSI with lantus 15 units daily  NEUROLOGIC A: Post op pain. Deconditioning P:   -PRN fentanyl. -will likely need LTAC placement  Goals of care >> limited code >> no CPR, no defibrillation.  Chesley Mires, MD [facility] Pulmonary/Critical Care 04/05/2013, 8:45 AM Pager:  [phone] After 3pm call: [phone]

## 2013-04-05 NOTE — Progress Notes (Signed)
04/05/13 patient is on trach collar 5L 28% sats of 98. Patient is stable Rt will continue to monitor.

## 2013-04-05 NOTE — Progress Notes (Signed)
Still a lot of secretions. TF 1u PRBC. Appreciate CCM assist. PEG site OK Patient examined and I agree with the assessment and plan  Georganna Skeans, MD, MPH, FACS Pager: [phone]  04/05/2013 9:19 AM

## 2013-04-06 ENCOUNTER — Inpatient Hospital Stay (HOSPITAL_COMMUNITY): Payer: Medicare Other

## 2013-04-06 DIAGNOSIS — D649 Anemia, unspecified: Secondary | ICD-10-CM

## 2013-04-06 LAB — GLUCOSE, CAPILLARY
Glucose-Capillary: 146 mg/dL — ABNORMAL HIGH (ref 70–99)
Glucose-Capillary: 168 mg/dL — ABNORMAL HIGH (ref 70–99)
Glucose-Capillary: 156 mg/dL — ABNORMAL HIGH (ref 70–99)
Glucose-Capillary: 96 mg/dL (ref 70–99)
Glucose-Capillary: 97 mg/dL (ref 70–99)
Glucose-Capillary: 138 mg/dL — ABNORMAL HIGH (ref 70–99)

## 2013-04-06 LAB — BASIC METABOLIC PANEL
BUN: 35 mg/dL — ABNORMAL HIGH (ref 6–23)
CHLORIDE: 115 meq/L — AB (ref 96–112)
CO2: 26 mEq/L (ref 19–32)
Calcium: 8.2 mg/dL — ABNORMAL LOW (ref 8.4–10.5)
Creatinine, Ser: 0.9 mg/dL (ref 0.50–1.10)
GFR calc Af Amer: 65 mL/min — ABNORMAL LOW (ref >90)
GFR calc non Af Amer: 56 mL/min — ABNORMAL LOW (ref >90)
GLUCOSE: 142 mg/dL — AB (ref 70–99)
POTASSIUM: 3.7 meq/L (ref 3.7–5.3)
Sodium: 151 mEq/L — ABNORMAL HIGH (ref 137–147)

## 2013-04-06 LAB — CBC
HCT: 25.1 % — ABNORMAL LOW (ref 36.0–46.0)
Hemoglobin: 8 g/dL — ABNORMAL LOW (ref 12.0–15.0)
MCH: 24.8 pg — ABNORMAL LOW (ref 26.0–34.0)
MCHC: 31.9 g/dL (ref 30.0–36.0)
MCV: 78 fL (ref 78.0–100.0)
Platelets: 386 10*3/uL (ref 150–400)
RBC: 3.22 MIL/uL — ABNORMAL LOW (ref 3.87–5.11)
RDW: 21.5 % — ABNORMAL HIGH (ref 11.5–15.5)
WBC: 13.1 10*3/uL — ABNORMAL HIGH (ref 4.0–10.5)

## 2013-04-06 LAB — TYPE AND SCREEN
ABO/RH(D): O POS
Antibody Screen: NEGATIVE
UNIT DIVISION: 0

## 2013-04-06 MED ORDER — ENOXAPARIN SODIUM 30 MG/0.3ML ~~LOC~~ SOLN
30.0000 mg | Freq: Every day | SUBCUTANEOUS | Status: DC
  Administered 2013-04-06 – 2013-04-19 (×14): 30 mg via SUBCUTANEOUS
  Filled 2013-04-06 (×14): qty 0.3

## 2013-04-06 MED ORDER — INSULIN GLARGINE 100 UNIT/ML ~~LOC~~ SOLN
20.0000 [IU] | Freq: Every day | SUBCUTANEOUS | Status: DC
  Administered 2013-04-06 – 2013-04-09 (×4): 20 [IU] via SUBCUTANEOUS
  Filled 2013-04-06 (×4): qty 0.2

## 2013-04-06 MED ORDER — DEXTROSE 5 % IV SOLN
INTRAVENOUS | Status: DC
  Administered 2013-04-06: 10:00:00 via INTRAVENOUS
  Administered 2013-04-07: 50 mL via INTRAVENOUS
  Administered 2013-04-09: 05:00:00 via INTRAVENOUS

## 2013-04-06 MED ORDER — LOPERAMIDE HCL 1 MG/5ML PO LIQD
2.0000 mg | Freq: Three times a day (TID) | ORAL | Status: DC
  Administered 2013-04-06 – 2013-04-10 (×14): 2 mg
  Filled 2013-04-06 (×19): qty 10

## 2013-04-06 NOTE — Progress Notes (Signed)
Patient ID: Kristi Alexander, female   DOB: 05-08-1925, 80 y.o.   MRN: [id number] 5 Days Post-Op  Subjective: Pt doing well.  On PMV for several hours yesterday and went back on the vent overnight.  Secretions thinner and she is tolerating those better.  Copious liquid diarrhea.  Objective: Vital signs in last 24 hours: Temp:  [97.9 F (36.6 C)-99 F (37.2 C)] 98.3 F (36.8 C) (02/11 0719) Pulse Rate:  [70-92] 77 (02/11 0753) Resp:  [18-36] 25 (02/11 0753) BP: (119-180)/(43-98) 155/52 mmHg (02/11 0753) SpO2:  [97 %-100 %] 100 % (02/11 0753) FiO2 (%):  [28 %-30 %] 28 % (02/11 0753) Weight:  [164 lb 3.9 oz (74.5 kg)] 164 lb 3.9 oz (74.5 kg) (02/11 0500) Last BM Date: 04/05/13  Intake/Output from previous day: 02/10 0701 - 02/11 0700 In: 3276 [I.V.:451.3; Blood:344.7; NG/GT:2200; IV Piggyback:200] Out: 1950 [Urine:1550; Stool:400] Intake/Output this shift:    PE: Abd: soft, minimally tender, incision healing well.  Steri-strips intact.  +BS, g-tube in place with tube feeds going.  flexi-seal in place with copious liquid stool Heart: regular Lungs: few rhonchi  Lab Results:   Recent Labs  04/05/13 1830 04/06/13 0500  WBC 14.0* 13.1*  HGB 8.3* 8.0*  HCT 24.8* 25.1*  PLT 400 386   BMET  Recent Labs  04/05/13 0410 04/06/13 0500  NA 140 151*  K 3.3* 3.7  CL 106 115*  CO2 25 26  GLUCOSE 466* 142*  BUN 44* 35*  CREATININE 1.03 0.90  CALCIUM 7.2* 8.2*   PT/INR No results found for this basename: LABPROT, INR,  in the last 72 hours CMP     Component Value Date/Time   NA 151* 04/06/2013 0500   K 3.7 04/06/2013 0500   CL 115* 04/06/2013 0500   CO2 26 04/06/2013 0500   GLUCOSE 142* 04/06/2013 0500   BUN 35* 04/06/2013 0500   CREATININE 0.90 04/06/2013 0500   CALCIUM 8.2* 04/06/2013 0500   PROT 4.9* 03/31/2013 0405   ALBUMIN 1.7* 03/31/2013 0405   AST 19 03/31/2013 0405   ALT 14 03/31/2013 0405   ALKPHOS 75 03/31/2013 0405   BILITOT 0.4 03/31/2013 0405   GFRNONAA 56* 04/06/2013  0500   GFRAA 65* 04/06/2013 0500   Lipase  No results found for this basename: lipase       Studies/Results: Dg Chest Port 1 View  04/05/2013   CLINICAL DATA:  Followup atelectasis  EXAM: PORTABLE CHEST - 1 VIEW  COMPARISON:  2/8/fifth  FINDINGS: Cardiac shadow is stable. A tracheostomy catheter is noted at the level of the aortic knob. A right-sided PICC line is again seen and stable. Bibasilar atelectatic changes are again. These are stable from the prior exam. No new focal abnormality is seen.  IMPRESSION: Stable bibasilar changes.  No new focal abnormality is noted.   Electronically Signed   By: Inez Catalina M.D.   On: 04/05/2013 09:39    Anti-infectives: Anti-infectives   Start     Dose/Rate Route Frequency Ordered Stop   03/24/13 0800  ciprofloxacin (CIPRO) IVPB 400 mg  Status:  Discontinued     400 mg 200 mL/hr over 60 Minutes Intravenous Every 24 hours 03/23/13 1241 04/02/13 0839   03/23/13 1700  metroNIDAZOLE (FLAGYL) IVPB 500 mg  Status:  Discontinued     500 mg 100 mL/hr over 60 Minutes Intravenous Every 8 hours 03/23/13 1236 04/02/13 0839   03/23/13 0730  [MAR Hold]  ciprofloxacin (CIPRO) IVPB 400 mg     (  On MAR Hold since 03/23/13 0805)   400 mg 200 mL/hr over 60 Minutes Intravenous On call to O.R. 03/23/13 0728 03/23/13 0824   03/23/13 0730  [MAR Hold]  metroNIDAZOLE (FLAGYL) IVPB 500 mg     (On MAR Hold since 03/23/13 0805)   500 mg 100 mL/hr over 60 Minutes Intravenous On call to O.R. 03/23/13 0728 03/23/13 0910       Assessment/Plan S/P SBR POD#14  S/P PEG, getting TFs at goal rate. Good bowel function  VDRF - S/P trach by CCM, weaning, appreciate their assistance  ID - off ABX, WBC down some, no sig fever  Deconditioned - will need PT/OT  FEN - TF via PEG, hypokalemia resolved.  Hypernatremia returns likely secondary to diarrhea.  Will add imodium via g-tube to see if this will slow down VTE -  Resume Lovenox today.  hgb 8.0 Dispo - ICU     LOS: 15 days     Maddyx Wieck E 04/06/2013, 8:04 AM Pager: [phone]

## 2013-04-06 NOTE — Progress Notes (Signed)
Patient is complaining of acute LLQ pain that began around 2200 tonight.  On call surgeon notified, and a KUB was ordered.  Results are pending.  I will continue to monitor.

## 2013-04-06 NOTE — Progress Notes (Signed)
Physical Therapy Treatment Patient Details Name: Kristi Alexander MRN: [id number] DOB: Apr 28, 1925 Today's Date: 04/06/2013 Time: 7672-0947 PT Time Calculation (min): 30 min  PT Assessment / Plan / Recommendation  History of Present Illness Pt s/p exp. lap and resection of 4 foot section of necrotic bowel.  pt back to room intubated.  Extubated 1/29.  Trached 2/6 due to pt having trouble protecting airway.   PT Comments   Visibly improved.  Talking via [employer] valve.  Removed for ambulation.  Gait mildly unsteady with noticeable fatigue,  But pt pushed herself well to the end.  Sats and EHR were stable .  Sats on 28% TC 90-95% and EHR in the 90's  Follow Up Recommendations  SNF     Does the patient have the potential to tolerate intense rehabilitation     Barriers to Discharge        Equipment Recommendations  Other (comment) (TBA at next venue)    Recommendations for Other Services    Frequency Min 3X/week   Progress towards PT Goals Progress towards PT goals: Progressing toward goals  Plan Current plan remains appropriate    Precautions / Restrictions Precautions Precautions: Fall Restrictions Weight Bearing Restrictions: No   Pertinent Vitals/Pain     Mobility  Transfers Overall transfer level: Needs assistance Transfers: Sit to/from Stand Sit to Stand: Mod assist Stand pivot transfers: Mod assist;+2 safety/equipment General transfer comment: cues for hand placement and safety; assist to come forward and stand Ambulation/Gait Ambulation/Gait assistance: Min assist;+2 safety/equipment Ambulation Distance (Feet): 75 Feet Assistive device:  (pushing w/c) Gait Pattern/deviations: Step-through pattern;Decreased step length - right;Decreased step length - left;Decreased stride length (mildly staggered steps with fatigue) Gait velocity: slow Gait velocity interpretation: Below normal speed for age/gender    Exercises     PT Diagnosis:    PT Problem List:   PT  Treatment Interventions:     PT Goals (current goals can now be found in the care plan section) Acute Rehab PT Goals Patient Stated Goal: 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 lap and resection of 4 foot section of necrotic bowel.  pt back to room intubated.  Extubated 1/29.  Trached 2/6 due to pt having trouble protecting airway.    Subjective Data  Subjective: Yep, I think I'm ready to try. Patient Stated Goal: 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   Cognition  Cognition Arousal/Alertness: Awake/alert Behavior During Therapy: WFL for tasks assessed/performed Overall Cognitive Status: Within Functional Limits for tasks assessed    Balance  Balance Overall balance assessment: Needs assistance Sitting-balance support: Single extremity supported Sitting balance-Leahy Scale: Good Standing balance support: Bilateral upper extremity supported Standing balance-Leahy Scale: Fair  End of Session PT - End of Session Equipment Utilized During Treatment: Oxygen Activity Tolerance: Patient tolerated treatment well;Patient limited by fatigue Patient left: in chair;with call bell/phone within reach;with family/visitor present Nurse Communication: Mobility status   GP     Janea Schwenn, [name] 04/06/2013, 12:16 PM 04/06/2013  Donnella Sham, [place] [phone]  (pager)

## 2013-04-06 NOTE — Progress Notes (Signed)
Up in chair on HTC. Improving. Quite deconditioned. Patient examined and I agree with the assessment and plan  Georganna Skeans, MD, MPH, FACS Pager: [phone]  04/06/2013 10:12 AM

## 2013-04-06 NOTE — Progress Notes (Signed)
Name: Kristi Alexander MRN: [id number] DOB: 26-Feb-1925    ADMISSION DATE:  03/22/2013 CONSULTATION DATE:  1/28  REFERRING MD :  Dr Hulen Skains PRIMARY SERVICE: [facility]  CHIEF COMPLAINT:  Vent and medical management   BRIEF PATIENT DESCRIPTION: 80 yo female with hx HTN admitted 1/27 with SBO.  Initially managed conservatively but cont to have worsening abd pain and increased WBC and was taken to OR 1/28 for exp lap and bowel resection.  Remained on vent post op and PCCM consulted.   SIGNIFICANT EVENTS / STUDIES:  1/27 admitted with SBO, conservative management  1/28 necrotic bowel suspected, to OR, 3-4 feet frankly necrotic bowel removed (no perforation)  2/01 Re-intubated; CT abd >> possible adhesions causing obstruction of SB, colon thickening ?infection or ischemia 2/08 On vent overnight ? Mucus plug 2/09 Off vent 2/10 on vent overnight >> mucus plug, transfuse 1 unit PRBC 2/11 on vent overnight for being worn out  LINES / TUBES: ETT 1/28>>>1/29; 2/1>>> 2/5, 2/5 >>2/6 Trach 2/6 >> L brachial aline 1/28>>> out Rt PICC 2/1 >>  CULTURES: Urine 1/28>>> negative  ANTIBIOTICS: Flagyl 1/28>>>2/7 Cipro 1/29 >> 2/7  SUBJECTIVE:  Back on vent at 2200 for fatigue. Currently on trach collar and PMV NAD@ rest.  VITAL SIGNS: Temp:  [97.9 F (36.6 C)-99 F (37.2 C)] 98.3 F (36.8 C) (02/11 0719) Pulse Rate:  [70-92] 77 (02/11 0753) Resp:  [18-36] 25 (02/11 0753) BP: (119-180)/(43-98) 155/52 mmHg (02/11 0753) SpO2:  [97 %-100 %] 100 % (02/11 0753) FiO2 (%):  [28 %-30 %] 28 % (02/11 0753) Weight:  [164 lb 3.9 oz (74.5 kg)] 164 lb 3.9 oz (74.5 kg) (02/11 0500)   INTAKE / OUTPUT: Intake/Output     02/10 0701 - 02/11 0700 02/11 0701 - 02/12 0700   I.V. (mL/kg) 451.3 (6.1)    Blood 344.7    Other 80    NG/GT 2200    IV Piggyback 200    Total Intake(mL/kg) 3276 (44)    Urine (mL/kg/hr) 1550 (0.9)    Stool 400 (0.2)    Total Output 1950     Net +1326            PHYSICAL  EXAMINATION: General:  No distress, sitting in chair Neuro:  Awake, follows commands HEENT: trach site clean, PMV in and speech clear Cardiovascular: regular Lungs: scattered rhonchi, no wheeze Abdomen:  Wound dressing clean,  Feeding tube (gtube in place with TF at 50 cc, diarrhea noted and flexiseal in place. Musculoskeletal: no edema  LABS:  CBC  Recent Labs Lab 04/05/13 0530 04/05/13 1830 04/06/13 0500  WBC 11.5* 14.0* 13.1*  HGB 6.1* 8.3* 8.0*  HCT 19.1* 24.8* 25.1*  PLT 354 400 386   Coag's  Recent Labs Lab 04/01/13 0415  APTT 31  INR 1.02   BMET  Recent Labs Lab 04/04/13 0354 04/05/13 0410 04/06/13 0500  NA 152* 140 151*  K 3.9 3.3* 3.7  CL 116* 106 115*  CO2 25 25 26   BUN 65* 44* 35*  CREATININE 1.39* 1.03 0.90  GLUCOSE 212* 466* 142*   Electrolytes  Recent Labs Lab 03/31/13 0405  04/02/13 0459  04/04/13 0354 04/05/13 0410 04/06/13 0500  CALCIUM 8.0*  < > 8.7  < > 8.1* 7.2* 8.2*  MG 2.1  --  1.7  --   --   --   --   PHOS 3.3  --   --   --   --   --   --   < > =  values in this interval not displayed.  ABG  Recent Labs Lab 03/30/13 1309 03/31/13 1440  PHART 7.410 7.426  PCO2ART 44.3 41.5  PO2ART 134.0* 75.0*   Liver Enzymes  Recent Labs Lab 03/31/13 0405  AST 19  ALT 14  ALKPHOS 75  BILITOT 0.4  ALBUMIN 1.7*   Glucose  Recent Labs Lab 04/05/13 1126 04/05/13 1522 04/05/13 1936 04/05/13 2319 04/06/13 0319 04/06/13 0722  GLUCAP 109* 123* 109* 129* 146* 168*    Imaging Dg Chest Port 1 View  04/05/2013   CLINICAL DATA:  Followup atelectasis  EXAM: PORTABLE CHEST - 1 VIEW  COMPARISON:  2/8/fifth  FINDINGS: Cardiac shadow is stable. A tracheostomy catheter is noted at the level of the aortic knob. A right-sided PICC line is again seen and stable. Bibasilar atelectatic changes are again. These are stable from the prior exam. No new focal abnormality is seen.  IMPRESSION: Stable bibasilar changes.  No new focal abnormality  is noted.   Electronically Signed   By: Inez Catalina M.D.   On: 04/05/2013 09:39    ASSESSMENT / PLAN:  PULMONARY A: Acute respiratory failure in setting of ischemic bowel and sepsis.  Re-intubated 2/1 & again 2/5 for stridor ultimately required trach >> completed course of steroids for stridor. Pleural effusions >> resolved. Intermittent mucus plugging. P:   -ATC as tolerated >> still needing vent support at night -PM valve as tolerated -bronchial hygiene -f/u CXR intermittently -prn xopenex  CARDIOVASCULAR A: Severe sepsis >> resolved. Hx of HTN. P:  -changed to lopressor per tube 2/10  RENAL A: Acute kidney injury (baseline creatinine 0.9 from 03/22/13) >> improving. Non-anion gap metabolic acidosis, lactic acidosis >> resolved. Hypervolemia >> improved. Hypernatremia >> worse 2/11, likely from diarrhea. Hypokalemia >> resolved. P:   -discontinued D5W 2/10 >> restart 2/11 due to short gut may not be absorbing h2o and enhanced due to diarrhea.  -continue free water tube flushes -f/u renal fx, urine outpt, electrolytes  GASTROINTESTINAL A: SBO from ischemic bowel. Protein calorie malnutrition. Diarrhea (short gut ) P:   -post-op care per CCS -continue tube feeds per [facility]CS -?if she could have swallow evaluation at some point in near future -protonix for SUP - flexiseal, consider imodium (c dif negative 2/9) per [facility]  HEMATOLOGIC A: Anemia of critical illness >> no obvious sites for bleeding. P:  -f/u CBC >> transfuse for Hb < 7 -lovenox for DVT prevention  INFECTIOUS A: Severe sepsis 2nd to ischemic bowel >> resolved. P:   -monitor clinically  ENDOCRINE A: Hyperglycemia. P:   -SSI with lantus 20 units Kristi(increased 2/11)  NEUROLOGIC A: Post op pain. Deconditioning P:   -PRN fentanyl. -will likely need LTAC placement  Goals of care >> limited code >> no CPR, no defibrillation.  Richardson Landry Minor ACNP Maryanna Shape PCCM Pager [phone] till 3 pm If no  answer page [phone] 04/06/2013, 9:27 AM  Reviewed above, examined pt, and agree with assessment/plan.  She is making progress, but still very deconditioned.  Still needing nocturnal vent support.  She would be good candidate for LTAC referral to continue rehab and vent weaning efforts >> defer to surgery timing for this.  Updated family at bedside.  Chesley Mires, MD [facility] Pulmonary/Critical Care 04/06/2013, 10:53 AM Pager:  [phone] After 3pm call: [phone]

## 2013-04-07 ENCOUNTER — Inpatient Hospital Stay (HOSPITAL_COMMUNITY): Payer: Medicare Other

## 2013-04-07 LAB — BASIC METABOLIC PANEL
BUN: 27 mg/dL — AB (ref 6–23)
CALCIUM: 7.7 mg/dL — AB (ref 8.4–10.5)
CO2: 26 mEq/L (ref 19–32)
CREATININE: 0.8 mg/dL (ref 0.50–1.10)
Chloride: 111 mEq/L (ref 96–112)
GFR calc Af Amer: 75 mL/min — ABNORMAL LOW (ref >90)
GFR, EST NON AFRICAN AMERICAN: 65 mL/min — AB (ref >90)
GLUCOSE: 190 mg/dL — AB (ref 70–99)
Potassium: 3.6 mEq/L — ABNORMAL LOW (ref 3.7–5.3)
Sodium: 148 mEq/L — ABNORMAL HIGH (ref 137–147)

## 2013-04-07 LAB — GLUCOSE, CAPILLARY
GLUCOSE-CAPILLARY: 114 mg/dL — AB (ref 70–99)
GLUCOSE-CAPILLARY: 171 mg/dL — AB (ref 70–99)
GLUCOSE-CAPILLARY: 88 mg/dL (ref 70–99)
Glucose-Capillary: 114 mg/dL — ABNORMAL HIGH (ref 70–99)
Glucose-Capillary: 107 mg/dL — ABNORMAL HIGH (ref 70–99)
Glucose-Capillary: 130 mg/dL — ABNORMAL HIGH (ref 70–99)

## 2013-04-07 LAB — CBC
HCT: 27 % — ABNORMAL LOW (ref 36.0–46.0)
HEMOGLOBIN: 8.7 g/dL — AB (ref 12.0–15.0)
MCH: 25.1 pg — AB (ref 26.0–34.0)
MCHC: 32.2 g/dL (ref 30.0–36.0)
MCV: 77.8 fL — AB (ref 78.0–100.0)
Platelets: 398 10*3/uL (ref 150–400)
RBC: 3.47 MIL/uL — ABNORMAL LOW (ref 3.87–5.11)
RDW: 21.8 % — ABNORMAL HIGH (ref 11.5–15.5)
WBC: 18.2 10*3/uL — ABNORMAL HIGH (ref 4.0–10.5)

## 2013-04-07 MED ORDER — IOHEXOL 300 MG/ML  SOLN
80.0000 mL | Freq: Once | INTRAMUSCULAR | Status: AC | PRN
  Administered 2013-04-07: 80 mL via INTRAVENOUS

## 2013-04-07 MED ORDER — IOHEXOL 300 MG/ML  SOLN
25.0000 mL | INTRAMUSCULAR | Status: AC
  Administered 2013-04-07 (×2): 25 mL via ORAL

## 2013-04-07 NOTE — Progress Notes (Signed)
Patient ID: Kristi Alexander, female   DOB: [birth year], 80 y.o.   MRN: [id number]   Subjective: On TC now, using accessory muscles.  Adequate sats.  Tolerating TF.  C/o LLQ abdominal pain, denies now.  DG abd negative for obstruction.    Objective:  Vital signs:  Filed Vitals:   04/07/13 0600 04/07/13 0700 04/07/13 0811 04/07/13 0900  BP: 136/44 133/47  137/54  Pulse: 86 89  85  Temp:   99.7 F (37.6 C)   TempSrc:   Oral   Resp: _0 Height:      Weight:      SpO2: 97% 100%  100%    Last BM Date: 04/05/13  Intake/Output   Yesterday:  02/11 0701 - 02/12 0700 In: 3919.2 [I.V.:1519.2; NG/GT:2400] Out: 1850 [Urine:1450; Stool:400] This shift:    I/O last 3 completed shifts: In: 5769.2 [I.V.:1839.2; Other:80; NG/GT:3850] Out: 2575 [Urine:2175; Stool:400]    Physical Exam: General: Pt awake/alert Abdomen: Soft.  Nondistended. Mild tenderness at incision only.  Inferior aspect of wound open, apply 2x2 to area, very superficial without evidence of site infection of abscess.    No evidence of peritonitis.  No incarcerated hernias.   Problem List:   Active Problems:   SBO (small bowel obstruction)   Acute respiratory failure   Sepsis   Acute renal failure    Results:   Labs: Results for orders placed during the hospital encounter of 03/22/13 (from the past 48 hour(s))  IRON AND TIBC     Status: Abnormal   Collection Time    04/05/13  9:30 AM      Result Value Ref Range   Iron 24 (*) 42 - 135 ug/dL   TIBC 177 (*) 250 - 470 ug/dL   Saturation Ratios 14 (*) 20 - 55 %   UIBC 153  125 - 400 ug/dL   Comment: Performed at [facility]     Status: None   Collection Time    04/05/13  9:30 AM      Result Value Ref Range   Ferritin 131  10 - 291 ng/mL   Comment: Performed at [place], CAPILLARY     Status: Abnormal   Collection Time    04/05/13 11:26 AM      Result Value Ref Range   Glucose-Capillary 109 (*) 70 - 99 mg/dL   GLUCOSE, CAPILLARY     Status: Abnormal   Collection Time    04/05/13  3:22 PM      Result Value Ref Range   Glucose-Capillary 123 (*) 70 - 99 mg/dL   Comment 1 Notify RN    CBC     Status: Abnormal   Collection Time    04/05/13  6:30 PM      Result Value Ref Range   WBC 14.0 (*) 4.0 - 10.5 K/uL   RBC 3.21 (*) 3.87 - 5.11 MIL/uL   Hemoglobin 8.3 (*) 12.0 - 15.0 g/dL   Comment: DELTA CHECK NOTED     POST TRANSFUSION SPECIMEN   HCT 24.8 (*) 36.0 - 46.0 %   MCV 77.3 (*) 78.0 - 100.0 fL   MCH 25.9 (*) 26.0 - 34.0 pg   MCHC 33.5  30.0 - 36.0 g/dL   RDW 21.2 (*) 11.5 - 15.5 %   Platelets 400  150 - 400 K/uL  GLUCOSE, CAPILLARY     Status: Abnormal   Collection Time    04/05/13  7:36 PM      Result Value Ref Range   Glucose-Capillary 109 (*) 70 - 99 mg/dL   Comment 1 Documented in Chart     Comment 2 Notify RN    GLUCOSE, CAPILLARY     Status: Abnormal   Collection Time    04/05/13 11:19 PM      Result Value Ref Range   Glucose-Capillary 129 (*) 70 - 99 mg/dL   Comment 1 Notify RN     Comment 2 Documented in Chart    GLUCOSE, CAPILLARY     Status: Abnormal   Collection Time    04/06/13  3:19 AM      Result Value Ref Range   Glucose-Capillary 146 (*) 70 - 99 mg/dL   Comment 1 Documented in Chart     Comment 2 Notify RN    CBC     Status: Abnormal   Collection Time    04/06/13  5:00 AM      Result Value Ref Range   WBC 13.1 (*) 4.0 - 10.5 K/uL   RBC 3.22 (*) 3.87 - 5.11 MIL/uL   Hemoglobin 8.0 (*) 12.0 - 15.0 g/dL   HCT 25.1 (*) 36.0 - 46.0 %   MCV 78.0  78.0 - 100.0 fL   MCH 24.8 (*) 26.0 - 34.0 pg   MCHC 31.9  30.0 - 36.0 g/dL   RDW 21.5 (*) 11.5 - 15.5 %   Platelets 386  150 - 400 K/uL  BASIC METABOLIC PANEL     Status: Abnormal   Collection Time    04/06/13  5:00 AM      Result Value Ref Range   Sodium 151 (*) 137 - 147 mEq/L   Potassium 3.7  3.7 - 5.3 mEq/L   Chloride 115 (*) 96 - 112 mEq/L   CO2 26  19 - 32 mEq/L   Glucose, Bld 142 (*) 70 - 99 mg/dL    BUN 35 (*) 6 - 23 mg/dL   Creatinine, Ser 0.90  0.50 - 1.10 mg/dL   Calcium 8.2 (*) 8.4 - 10.5 mg/dL   GFR calc non Af Amer 56 (*) >90 mL/min   GFR calc Af Amer 65 (*) >90 mL/min   Comment: (NOTE)     The eGFR has been calculated using the CKD EPI equation.     This calculation has not been validated in all clinical situations.     eGFR's persistently <90 mL/min signify possible Chronic Kidney     Disease.  GLUCOSE, CAPILLARY     Status: Abnormal   Collection Time    04/06/13  7:22 AM      Result Value Ref Range   Glucose-Capillary 168 (*) 70 - 99 mg/dL  GLUCOSE, CAPILLARY     Status: Abnormal   Collection Time    04/06/13 11:41 AM      Result Value Ref Range   Glucose-Capillary 156 (*) 70 - 99 mg/dL  GLUCOSE, CAPILLARY     Status: None   Collection Time    04/06/13  3:48 PM      Result Value Ref Range   Glucose-Capillary 96  70 - 99 mg/dL  GLUCOSE, CAPILLARY     Status: None   Collection Time    04/06/13  7:15 PM      Result Value Ref Range   Glucose-Capillary 97  70 - 99 mg/dL   Comment 1 Documented in Chart     Comment 2 Notify RN    GLUCOSE,  CAPILLARY     Status: Abnormal   Collection Time    04/06/13 11:17 PM      Result Value Ref Range   Glucose-Capillary 138 (*) 70 - 99 mg/dL   Comment 1 Documented in Chart     Comment 2 Notify RN    GLUCOSE, CAPILLARY     Status: Abnormal   Collection Time    04/07/13  3:13 AM      Result Value Ref Range   Glucose-Capillary 114 (*) 70 - 99 mg/dL   Comment 1 Documented in Chart     Comment 2 Notify RN    CBC     Status: Abnormal   Collection Time    04/07/13  4:15 AM      Result Value Ref Range   WBC 18.2 (*) 4.0 - 10.5 K/uL   RBC 3.47 (*) 3.87 - 5.11 MIL/uL   Hemoglobin 8.7 (*) 12.0 - 15.0 g/dL   HCT 27.0 (*) 36.0 - 46.0 %   MCV 77.8 (*) 78.0 - 100.0 fL   MCH 25.1 (*) 26.0 - 34.0 pg   MCHC 32.2  30.0 - 36.0 g/dL   RDW 21.8 (*) 11.5 - 15.5 %   Platelets 398  150 - 400 K/uL  BASIC METABOLIC PANEL     Status: Abnormal    Collection Time    04/07/13  4:15 AM      Result Value Ref Range   Sodium 148 (*) 137 - 147 mEq/L   Potassium 3.6 (*) 3.7 - 5.3 mEq/L   Chloride 111  96 - 112 mEq/L   CO2 26  19 - 32 mEq/L   Glucose, Bld 190 (*) 70 - 99 mg/dL   BUN 27 (*) 6 - 23 mg/dL   Creatinine, Ser 0.80  0.50 - 1.10 mg/dL   Calcium 7.7 (*) 8.4 - 10.5 mg/dL   GFR calc non Af Amer 65 (*) >90 mL/min   GFR calc Af Amer 75 (*) >90 mL/min   Comment: (NOTE)     The eGFR has been calculated using the CKD EPI equation.     This calculation has not been validated in all clinical situations.     eGFR's persistently <90 mL/min signify possible Chronic Kidney     Disease.  GLUCOSE, CAPILLARY     Status: Abnormal   Collection Time    04/07/13  8:10 AM      Result Value Ref Range   Glucose-Capillary 171 (*) 70 - 99 mg/dL   Comment 1 Documented in Chart     Comment 2 Notify RN      Imaging / Studies: Dg Chest Port 1 View  04/07/2013   CLINICAL DATA:  Percutaneous endoscopic gastrostomy tube placement  EXAM: PORTABLE CHEST - 1 VIEW  COMPARISON:  DG CHEST 1V PORT dated 04/05/2013; DG CHEST 1V PORT dated 04/03/2013; DG CHEST 1V PORT dated 04/01/2013  FINDINGS: Grossly unchanged borderline enlarged cardiac silhouette and mediastinal contours given patient rotation. Stable position of support apparatus. No pneumothorax. No change to minimal decrease in suspected trace bilateral effusions. Bibasilar heterogeneous opacities have minimally worsened in the interval. Mild poor venous congestion without frank evidence of edema. Unchanged bones.  IMPRESSION: 1.  Stable positioning of support apparatus.  No pneumothorax. 2. Worsening bibasilar opacities, atelectasis versus infiltrate. 3. No change to minimal decrease in suspected trace bilateral effusions. Pulmonary venous congestion without frank evidence of edema.   Electronically Signed   By: Sandi Mariscal M.D.   On:  04/07/2013 08:04   Dg Chest Port 1 View  04/05/2013   CLINICAL DATA:   Followup atelectasis  EXAM: PORTABLE CHEST - 1 VIEW  COMPARISON:  2/8/fifth  FINDINGS: Cardiac shadow is stable. A tracheostomy catheter is noted at the level of the aortic knob. A right-sided PICC line is again seen and stable. Bibasilar atelectatic changes are again. These are stable from the prior exam. No new focal abnormality is seen.  IMPRESSION: Stable bibasilar changes.  No new focal abnormality is noted.   Electronically Signed   By: Inez Catalina M.D.   On: 04/05/2013 09:39   Dg Abd Portable 1v  04/07/2013   CLINICAL DATA:  Left lower quadrant pain  EXAM: PORTABLE ABDOMEN - 1 VIEW  COMPARISON:  None.  FINDINGS: The bowel gas pattern is normal. No radio-opaque calculi or other significant radiographic abnormality are seen. Degenerative joint changes of the spine are noted. A feeding tube is projected over the mid abdomen.  IMPRESSION: No bowel obstruction.   Electronically Signed   By: Abelardo Diesel M.D.   On: 04/07/2013 00:16    Medications / Allergies: per chart  Antibiotics: Anti-infectives   Start     Dose/Rate Route Frequency Ordered Stop   03/24/13 0800  ciprofloxacin (CIPRO) IVPB 400 mg  Status:  Discontinued     400 mg 200 mL/hr over 60 Minutes Intravenous Every 24 hours 03/23/13 1241 04/02/13 0839   03/23/13 1700  metroNIDAZOLE (FLAGYL) IVPB 500 mg  Status:  Discontinued     500 mg 100 mL/hr over 60 Minutes Intravenous Every 8 hours 03/23/13 1236 04/02/13 0839   03/23/13 0730  [MAR Hold]  ciprofloxacin (CIPRO) IVPB 400 mg     (On MAR Hold since 03/23/13 0805)   400 mg 200 mL/hr over 60 Minutes Intravenous On call to O.R. 03/23/13 1003 03/23/13 0824   03/23/13 0730  [MAR Hold]  metroNIDAZOLE (FLAGYL) IVPB 500 mg     (On MAR Hold since 03/23/13 0805)   500 mg 100 mL/hr over 60 Minutes Intravenous On call to O.R. 03/23/13 4961 03/23/13 0910     Assessment/Plan  SBO S/P SBR POD#15  AKI-improved  S/P PEG, getting TFs at goal rate. Good bowel function  VDRF - S/P trach by CCM,  weaning, appreciate their assistance  ID - off ABX, WBC up 13.1--->18.2 ?CT scan Deconditioned - PT/OT, up to chair.   PCM- TF via PEG, hypokalemia resolved. Hypernatremia returns likely secondary to diarrhea. Imodium added, monitor for constipation. VTE - SCDs, lovenox  Dispo - ICU  Erby Pian, [facility] Pager [phone] Office [phone]  04/07/2013 9:15 AM

## 2013-04-07 NOTE — Progress Notes (Signed)
Patient placed back on ventilator due to increased work of breathing and use of accessory muscles.  RT will continue to monitor.

## 2013-04-07 NOTE — Progress Notes (Signed)
Name: Kristi Alexander MRN: [id number] DOB: 1925-12-07    ADMISSION DATE:  03/22/2013 CONSULTATION DATE:  1/28  REFERRING MD :  Dr Hulen Skains PRIMARY SERVICE: [facility]  CHIEF COMPLAINT:  Vent and medical management   BRIEF PATIENT DESCRIPTION: 80 yo female with hx HTN admitted 1/27 with SBO.  Initially managed conservatively but cont to have worsening abd pain and increased WBC and was taken to OR 1/28 for exp lap and bowel resection.  Remained on vent post op and PCCM consulted.   SIGNIFICANT EVENTS / STUDIES:  1/27 admitted with SBO, conservative management  1/28 necrotic bowel suspected, to OR, 3-4 feet frankly necrotic bowel removed (no perforation)  2/01 Re-intubated; CT abd >> possible adhesions causing obstruction of SB, colon thickening ?infection or ischemia 2/08 On vent overnight ? Mucus plug 2/09 Off vent 2/10 on vent overnight >> mucus plug, transfuse 1 unit PRBC 2/11 on vent overnight for being worn out 2/12 unable to sleep off vent in early hours of AM, working too hard, placed back on vent  LINES / TUBES: ETT 1/28>>>1/29; 2/1>>> 2/5, 2/5 >>2/6 Trach 2/6 >> L brachial aline 1/28>>> out Foley 1/28 >>> Rt PICC 2/1 >> Gastrostomy 2/6 >>>  CULTURES: Urine 1/28>>> negative C.Diff >>> Neg  ANTIBIOTICS: Flagyl 1/28>>>2/7 Cipro 1/29 >> 2/7  SUBJECTIVE:  Off vent until 3:30AM overnight but had increased work of breathing so placed back on vent for.  Still on vent this AM as pt told RN that she didn't get much rest overnight due to difficulty breathing off vent. Otherwise, no acute events.  VITAL SIGNS: Temp:  [97.8 F (36.6 C)-99.7 F (37.6 C)] 99.7 F (37.6 C) (02/12 0811) Pulse Rate:  [64-104] 85 (02/12 0900) Resp:  [18-38] 26 (02/12 0900) BP: (126-180)/(44-147) 137/54 mmHg (02/12 0900) SpO2:  [91 %-100 %] 100 % (02/12 0900) FiO2 (%):  [15 %-30 %] 30 % (02/12 0900) Weight:  [160 lb 15 oz (73 kg)] 160 lb 15 oz (73 kg) (02/12 0500)   INTAKE / OUTPUT: Intake/Output      02/11 0701 - 02/12 0700 02/12 0701 - 02/13 0700   I.V. (mL/kg) 1519.2 (20.8)    Blood     Other     NG/GT 2400    IV Piggyback     Total Intake(mL/kg) 3919.2 (53.7)    Urine (mL/kg/hr) 1450 (0.8)    Stool 400 (0.2)    Total Output 1850     Net +2069.2            PHYSICAL EXAMINATION: General:  No distress, resting in bed on vent. Neuro:  Awake, follows commands HEENT: trach site C/D/I. Cardiovascular: RRR, no M/R/G. Lungs: scattered rhonchi, no wheeze.  Resp's even and unlabored. Abdomen:  +BS.  Tenderness in LLQ.  Wound dressing C/D/I,  Feeding tube (gtube in place with TF at 50 cc, diarrhea noted and flexiseal in place. Musculoskeletal: no edema  LABS:  CBC  Recent Labs Lab 04/05/13 1830 04/06/13 0500 04/07/13 0415  WBC 14.0* 13.1* 18.2*  HGB 8.3* 8.0* 8.7*  HCT 24.8* 25.1* 27.0*  PLT 400 386 398   Coag's  Recent Labs Lab 04/01/13 0415  APTT 31  INR 1.02   BMET  Recent Labs Lab 04/05/13 0410 04/06/13 0500 04/07/13 0415  NA 140 151* 148*  K 3.3* 3.7 3.6*  CL 106 115* 111  CO2 25 26 26   BUN 44* 35* 27*  CREATININE 1.03 0.90 0.80  GLUCOSE 466* 142* 190*   Electrolytes  Recent Labs Lab 04/02/13 0459  04/05/13 0410 04/06/13 0500 04/07/13 0415  CALCIUM 8.7  < > 7.2* 8.2* 7.7*  MG 1.7  --   --   --   --   < > = values in this interval not displayed.  ABG  Recent Labs Lab 03/31/13 1440  PHART 7.426  PCO2ART 41.5  PO2ART 75.0*   Glucose  Recent Labs Lab 04/06/13 1141 04/06/13 1548 04/06/13 1915 04/06/13 2317 04/07/13 0313 04/07/13 0810  GLUCAP 156* 96 97 138* 114* 171*    Imaging Dg Chest Port 1 View  04/07/2013   CLINICAL DATA:  Percutaneous endoscopic gastrostomy tube placement  EXAM: PORTABLE CHEST - 1 VIEW  COMPARISON:  DG CHEST 1V PORT dated 04/05/2013; DG CHEST 1V PORT dated 04/03/2013; DG CHEST 1V PORT dated 04/01/2013  FINDINGS: Grossly unchanged borderline enlarged cardiac silhouette and mediastinal contours given  patient rotation. Stable position of support apparatus. No pneumothorax. No change to minimal decrease in suspected trace bilateral effusions. Bibasilar heterogeneous opacities have minimally worsened in the interval. Mild poor venous congestion without frank evidence of edema. Unchanged bones.  IMPRESSION: 1.  Stable positioning of support apparatus.  No pneumothorax. 2. Worsening bibasilar opacities, atelectasis versus infiltrate. 3. No change to minimal decrease in suspected trace bilateral effusions. Pulmonary venous congestion without frank evidence of edema.   Electronically Signed   By: Sandi Mariscal M.D.   On: 04/07/2013 08:04   Dg Abd Portable 1v  04/07/2013   CLINICAL DATA:  Left lower quadrant pain  EXAM: PORTABLE ABDOMEN - 1 VIEW  COMPARISON:  None.  FINDINGS: The bowel gas pattern is normal. No radio-opaque calculi or other significant radiographic abnormality are seen. Degenerative joint changes of the spine are noted. A feeding tube is projected over the mid abdomen.  IMPRESSION: No bowel obstruction.   Electronically Signed   By: Abelardo Diesel M.D.   On: 04/07/2013 00:16    ASSESSMENT / PLAN:  PULMONARY A: Acute respiratory failure in setting of ischemic bowel and sepsis - Re-intubated 2/1 & again 2/5 for stridor ultimately required trach >> completed course of steroids for stridor. Small Pleural effusions >> unchanged. Intermittent mucus plugging. P:   -ATC as tolerated >> still needing vent support at night. Wean as able -PM valve as tolerated -bronchial hygiene -f/u CXR intermittently -prn xopenex  CARDIOVASCULAR A: Severe sepsis >> resolved. Hx of HTN. P:  -changed to lopressor per tube 2/10  RENAL A: Acute kidney injury (baseline creatinine 0.9 from 03/22/13) >> resolved. Non-anion gap metabolic acidosis, lactic acidosis >> resolved. Hypervolemia >> improved. Hypernatremia >> improved from 2/11. Hypokalemia. P:   -discontinued D5W 2/10 >> restart 2/11 due to short  gut may not be absorbing h2o and enhanced due to diarrhea.  -continue free water tube flushes -f/u renal fx, urine outpt, electrolytes  GASTROINTESTINAL A: SBO from ischemic bowel. Protein calorie malnutrition. Diarrhea (short gut ) P:   -post-op care per CCS -continue tube feeds per [facility] -?if she could have swallow evaluation at some point in near future -protonix for SUP - flexiseal > imodium per [facility]CS  (c dif negative 2/9).  HEMATOLOGIC A: Anemia of critical illness >> no obvious sites for bleeding. P:  -trend CBC >> transfuse for Hb < 7 -lovenox for DVT prevention  INFECTIOUS A: Severe sepsis 2nd to ischemic bowel >> resolved. Leukocytosis - elevated to 18.2 this AM.   P:   -? CT scan per surgery, would not be a bad idea given new  LLQ pain. -monitor WBC/fever curve.  ENDOCRINE A: Hyperglycemia. P:   -SSI with lantus 20 units daily(increased 2/11)  NEUROLOGIC A: Post op pain. Deconditioning P:   -PRN fentanyl. -PT/OT as able. -will likely need LTAC placement  Goals of care >> limited code >> no CPR, no defibrillation.  Montey Hora, PA - C [facility] Pgr: [phone]  or [phone]  Reviewed above, examined pt, and agree with assessment/plan.  She is making progress, but still very deconditioned.  Still needing nocturnal vent support.  She would be good candidate for LTAC referral to continue rehab and vent weaning efforts >> defer to surgery timing for this.  Chesley Mires, MD [facility] 04/07/2013, 11:20 AM Pager:  [phone] After 3pm call: [phone]

## 2013-04-07 NOTE — Progress Notes (Signed)
Per pt, she wants to try to sleep (nap) for a couple of hours.  Plan: will try to wean later this morning/this afternoon.  RN aware.

## 2013-04-07 NOTE — Progress Notes (Signed)
WBC elevation may be due to PNA as CXR and secretions seen worse. We will also check CT A/P to R/O abscess. Patient examined and I agree with the assessment and plan  Georganna Skeans, MD, MPH, FACS Pager: [phone]  04/07/2013 1:26 PM

## 2013-04-07 NOTE — Progress Notes (Signed)
CSW continuing to follow for appropriate dc plans when medically more stable.  Jeanette Caprice, MSW, [place]

## 2013-04-08 ENCOUNTER — Inpatient Hospital Stay (HOSPITAL_COMMUNITY): Payer: Medicare Other

## 2013-04-08 LAB — BASIC METABOLIC PANEL
BUN: 19 mg/dL (ref 6–23)
CALCIUM: 7.6 mg/dL — AB (ref 8.4–10.5)
CO2: 26 meq/L (ref 19–32)
CREATININE: 0.76 mg/dL (ref 0.50–1.10)
Chloride: 107 mEq/L (ref 96–112)
GFR calc Af Amer: 86 mL/min — ABNORMAL LOW (ref >90)
GFR calc non Af Amer: 74 mL/min — ABNORMAL LOW (ref >90)
GLUCOSE: 129 mg/dL — AB (ref 70–99)
Potassium: 3.1 mEq/L — ABNORMAL LOW (ref 3.7–5.3)
Sodium: 143 mEq/L (ref 137–147)

## 2013-04-08 LAB — GLUCOSE, CAPILLARY
GLUCOSE-CAPILLARY: 136 mg/dL — AB (ref 70–99)
GLUCOSE-CAPILLARY: 119 mg/dL — AB (ref 70–99)
GLUCOSE-CAPILLARY: 115 mg/dL — AB (ref 70–99)
Glucose-Capillary: 101 mg/dL — ABNORMAL HIGH (ref 70–99)
Glucose-Capillary: 73 mg/dL (ref 70–99)

## 2013-04-08 LAB — CBC
HEMATOCRIT: 22.5 % — AB (ref 36.0–46.0)
Hemoglobin: 7.5 g/dL — ABNORMAL LOW (ref 12.0–15.0)
MCH: 26.3 pg (ref 26.0–34.0)
MCHC: 33.3 g/dL (ref 30.0–36.0)
MCV: 78.9 fL (ref 78.0–100.0)
Platelets: 343 10*3/uL (ref 150–400)
RBC: 2.85 MIL/uL — AB (ref 3.87–5.11)
RDW: 21.5 % — ABNORMAL HIGH (ref 11.5–15.5)
WBC: 12.5 10*3/uL — AB (ref 4.0–10.5)

## 2013-04-08 MED ORDER — POTASSIUM CHLORIDE 10 MEQ/50ML IV SOLN
10.0000 meq | INTRAVENOUS | Status: AC
  Administered 2013-04-08 (×4): 10 meq via INTRAVENOUS
  Filled 2013-04-08 (×4): qty 50

## 2013-04-08 MED ORDER — DEXTROSE 50 % IV SOLN
25.0000 mL | Freq: Once | INTRAVENOUS | Status: AC | PRN
  Administered 2013-04-08: 25 mL via INTRAVENOUS

## 2013-04-08 MED ORDER — GLYCOPYRROLATE 1 MG PO TABS
1.0000 mg | ORAL_TABLET | Freq: Two times a day (BID) | ORAL | Status: DC
  Filled 2013-04-08 (×2): qty 1

## 2013-04-08 MED ORDER — DEXTROSE 50 % IV SOLN
INTRAVENOUS | Status: AC
  Filled 2013-04-08: qty 50

## 2013-04-08 MED ORDER — POTASSIUM CHLORIDE 10 MEQ/100ML IV SOLN: 10.0000 meq | INTRAVENOUS | Status: DC

## 2013-04-08 MED ORDER — LEVALBUTEROL HCL 0.63 MG/3ML IN NEBU
0.6300 mg | INHALATION_SOLUTION | Freq: Two times a day (BID) | RESPIRATORY_TRACT | Status: DC
  Administered 2013-04-08 – 2013-04-11 (×7): 0.63 mg via RESPIRATORY_TRACT
  Filled 2013-04-08 (×10): qty 3

## 2013-04-08 NOTE — Progress Notes (Signed)
Patient ID: Kristi Alexander, female   DOB: [birth year], 80 y.o.   MRN: [id number]  Subjective: Requiring vent support at night.  flexiseal in place, still have loose stools.    Objective:  Vital signs:  Filed Vitals:   04/08/13 0716 04/08/13 0800 04/08/13 0900 04/08/13 0905  BP:  134/82 153/65   Pulse:  85 101 79  Temp: 98.4 F (36.9 C)     TempSrc: Oral     Resp:  36 28 25  Height:      Weight:      SpO2:  100% 100% 100%    Last BM Date: 04/05/13  Intake/Output   Yesterday:  02/12 0701 - 02/13 0700 In: 3510.8 [I.V.:1560; NG/GT:1920.8] Out: 3140 [Urine:2640; Stool:500] This shift:  Total I/O In: 200 [I.V.:200] Out: 250 [Urine:250]  Physical Exam:  General: Pt awake/alert Cards: s1s2 rrr.  No murmurs, gallops or rubs.  Trace edema.   Lungs: course throughout   Abdomen: Soft. Nondistended. Mild tenderness at incision only. Inferior aspect of wound open, apply 2x2 to area, very superficial without evidence of site infection of abscess. No evidence of peritonitis. No incarcerated hernias.    Problem List:   Active Problems:   SBO (small bowel obstruction)   Acute respiratory failure   Sepsis   Acute renal failure    Results:   Labs: Results for orders placed during the hospital encounter of 03/22/13 (from the past 48 hour(s))  GLUCOSE, CAPILLARY     Status: Abnormal   Collection Time    04/06/13 11:41 AM      Result Value Ref Range   Glucose-Capillary 156 (*) 70 - 99 mg/dL  GLUCOSE, CAPILLARY     Status: None   Collection Time    04/06/13  3:48 PM      Result Value Ref Range   Glucose-Capillary 96  70 - 99 mg/dL  GLUCOSE, CAPILLARY     Status: None   Collection Time    04/06/13  7:15 PM      Result Value Ref Range   Glucose-Capillary 97  70 - 99 mg/dL   Comment 1 Documented in Chart     Comment 2 Notify RN    GLUCOSE, CAPILLARY     Status: Abnormal   Collection Time    04/06/13 11:17 PM      Result Value Ref Range   Glucose-Capillary 138 (*) 70 -  99 mg/dL   Comment 1 Documented in Chart     Comment 2 Notify RN    GLUCOSE, CAPILLARY     Status: Abnormal   Collection Time    04/07/13  3:13 AM      Result Value Ref Range   Glucose-Capillary 114 (*) 70 - 99 mg/dL   Comment 1 Documented in Chart     Comment 2 Notify RN    CBC     Status: Abnormal   Collection Time    04/07/13  4:15 AM      Result Value Ref Range   WBC 18.2 (*) 4.0 - 10.5 K/uL   RBC 3.47 (*) 3.87 - 5.11 MIL/uL   Hemoglobin 8.7 (*) 12.0 - 15.0 g/dL   HCT 27.0 (*) 36.0 - 46.0 %   MCV 77.8 (*) 78.0 - 100.0 fL   MCH 25.1 (*) 26.0 - 34.0 pg   MCHC 32.2  30.0 - 36.0 g/dL   RDW 21.8 (*) 11.5 - 15.5 %   Platelets 398  150 - 400 K/uL  BASIC METABOLIC  PANEL     Status: Abnormal   Collection Time    04/07/13  4:15 AM      Result Value Ref Range   Sodium 148 (*) 137 - 147 mEq/L   Potassium 3.6 (*) 3.7 - 5.3 mEq/L   Chloride 111  96 - 112 mEq/L   CO2 26  19 - 32 mEq/L   Glucose, Bld 190 (*) 70 - 99 mg/dL   BUN 27 (*) 6 - 23 mg/dL   Creatinine, Ser 0.80  0.50 - 1.10 mg/dL   Calcium 7.7 (*) 8.4 - 10.5 mg/dL   GFR calc non Af Amer 65 (*) >90 mL/min   GFR calc Af Amer 75 (*) >90 mL/min   Comment: (NOTE)     The eGFR has been calculated using the CKD EPI equation.     This calculation has not been validated in all clinical situations.     eGFR's persistently <90 mL/min signify possible Chronic Kidney     Disease.  GLUCOSE, CAPILLARY     Status: Abnormal   Collection Time    04/07/13  8:10 AM      Result Value Ref Range   Glucose-Capillary 171 (*) 70 - 99 mg/dL   Comment 1 Documented in Chart     Comment 2 Notify RN    GLUCOSE, CAPILLARY     Status: Abnormal   Collection Time    04/07/13 11:40 AM      Result Value Ref Range   Glucose-Capillary 114 (*) 70 - 99 mg/dL   Comment 1 Documented in Chart     Comment 2 Notify RN    GLUCOSE, CAPILLARY     Status: Abnormal   Collection Time    04/07/13  3:48 PM      Result Value Ref Range   Glucose-Capillary 107 (*)  70 - 99 mg/dL   Comment 1 Documented in Chart     Comment 2 Notify RN    GLUCOSE, CAPILLARY     Status: None   Collection Time    04/07/13  7:16 PM      Result Value Ref Range   Glucose-Capillary 88  70 - 99 mg/dL   Comment 1 Documented in Chart     Comment 2 Notify RN    GLUCOSE, CAPILLARY     Status: Abnormal   Collection Time    04/07/13 11:14 PM      Result Value Ref Range   Glucose-Capillary 130 (*) 70 - 99 mg/dL   Comment 1 Documented in Chart     Comment 2 Notify RN    GLUCOSE, CAPILLARY     Status: Abnormal   Collection Time    04/08/13  3:18 AM      Result Value Ref Range   Glucose-Capillary 136 (*) 70 - 99 mg/dL   Comment 1 Documented in Chart     Comment 2 Notify RN    CBC     Status: Abnormal   Collection Time    04/08/13  4:14 AM      Result Value Ref Range   WBC 12.5 (*) 4.0 - 10.5 K/uL   RBC 2.85 (*) 3.87 - 5.11 MIL/uL   Hemoglobin 7.5 (*) 12.0 - 15.0 g/dL   HCT 22.5 (*) 36.0 - 46.0 %   MCV 78.9  78.0 - 100.0 fL   MCH 26.3  26.0 - 34.0 pg   MCHC 33.3  30.0 - 36.0 g/dL   RDW 21.5 (*) 11.5 - 15.5 %  Platelets 343  150 - 400 K/uL  BASIC METABOLIC PANEL     Status: Abnormal   Collection Time    04/08/13  4:14 AM      Result Value Ref Range   Sodium 143  137 - 147 mEq/L   Potassium 3.1 (*) 3.7 - 5.3 mEq/L   Chloride 107  96 - 112 mEq/L   CO2 26  19 - 32 mEq/L   Glucose, Bld 129 (*) 70 - 99 mg/dL   BUN 19  6 - 23 mg/dL   Creatinine, Ser 0.76  0.50 - 1.10 mg/dL   Calcium 7.6 (*) 8.4 - 10.5 mg/dL   GFR calc non Af Amer 74 (*) >90 mL/min   GFR calc Af Amer 86 (*) >90 mL/min   Comment: (NOTE)     The eGFR has been calculated using the CKD EPI equation.     This calculation has not been validated in all clinical situations.     eGFR's persistently <90 mL/min signify possible Chronic Kidney     Disease.  GLUCOSE, CAPILLARY     Status: Abnormal   Collection Time    04/08/13  7:17 AM      Result Value Ref Range   Glucose-Capillary 101 (*) 70 - 99 mg/dL    Comment 1 Notify RN      Imaging / Studies: Ct Abdomen Pelvis W Contrast  04/07/2013   CLINICAL DATA:  Evaluate for abscess.  S/P surgery.  SBO  EXAM: CT ABDOMEN AND PELVIS WITH CONTRAST  TECHNIQUE: Multidetector CT imaging of the abdomen and pelvis was performed using the standard protocol following bolus administration of intravenous contrast.  CONTRAST:  58m OMNIPAQUE IOHEXOL 300 MG/ML  SOLN  COMPARISON:  DG ABD PORTABLE 1V dated 04/06/2013; CT ABD/PELV WO CM dated 03/27/2013; CT ABD/PELVIS W CM dated 03/22/2013  FINDINGS: Evaluation lung bases demonstrates small bilateral pleural effusions slightly decreased in size compared to previous study. There is diffuse thickening of the interstitial markings which is also decreased in conspicuity when compared to previous study. Multi chamber cardiac enlargement is appreciated.  The liver, spleen, right adrenal, and kidneys are unremarkable. A stable 2 cm nodule is identified along the lower pole of the left adrenal. The finding is stable when compared to prior studies and again statistically likely represents an adenoma with os indeterminate CT characteristics. If clinically warranted this finding may be further characterized with MRI, adrenal protocol.  Multiple dilated loops of small bowel are appreciated within the central and lower abdomen containing contrast. Contrast is appreciated within loops of nondilated large bowel.  There is evidence of bowel wall thickening involving the mid to distal aspect of the transverse colon. Inflammatory change is identified within the adjacent mesenteric fat. A small fluid attenuating focus projects deep to the umbilicus image 48 series 2 measuring 2.9 x 1 cm. This finding is adjacent to the mid transverse colon. Differential considerations are post- operative changes. There is diffuse diverticulosis within the transverse colon.  There is otherwise no further evidence of loculated fluid collections. Mild diffuse free fluid is  appreciated within the abdomen as well as mild mesenteric edema.  Evaluation gallbladder demonstrates a partial calcified gallstone in the dependent portion of gallbladder. Small amount of pericholecystic fluid is appreciated.  There is no evidence of abdominal aortic aneurysm. Atherosclerotic calcifications are identified within the abdominal aorta and mesenteric vessels.  There is no evidence of abdominal or pelvic masses nor adenopathy.  Evaluation pelvis demonstrates a small amount of  free fluid within the dependent portions of the pelvis. A rectal tube is identified with balloon insufflated in the rectum.  A small fat containing left inguinal hernia is identified. A small fat containing umbilical hernia is also appreciated.  There is evidence of infiltration of the subcutaneous fat of the abdominal and pelvic wall.  A percutaneous gastric feeding tube projects in the region the body of the stomach.  There is not appear to be evidence of aggressive appearing osseous lesions multilevel spondylosis is appreciated within the spine.  IMPRESSION: 1. CT findings consistent with a small bowel ileus without further evidence of obstruction. 2. Postoperative changes identified within the abdomen. The small focal fluid collection deep to the umbilicus likely represents postoperative change. 3. Findings concerning for colitis infectious or inflammatory involving the transverse colon. These findings may reflect the sequela of diverticulitis considering the diffuse diverticulosis which is present. These findings are best appreciated on images 40 through 45 series 2. 4. Pericholecystic fluid likely postsurgical. Gallstone is identified within the gallbladder. 5. Improving bilateral effusions and decreased conspicuity of the areas of dependent atelectasis. The interstitial findings have also decreased in conspicuity likely reflecting improving pulmonary edema.   Electronically Signed   By: Margaree Mackintosh M.D.   On: 04/07/2013  16:55   Dg Chest Port 1 View  04/08/2013   CLINICAL DATA:  Hypertension, check infiltrate/effusion  EXAM: PORTABLE CHEST - 1 VIEW  COMPARISON:  04/07/2013  FINDINGS: The tracheostomy tube is again identified in satisfactory position. A right-sided PICC line is noted at the cavoatrial junction. The cardiac shadow is stable. Bibasilar infiltrates are noted right greater than left with associated effusion. No new focal abnormality is seen when compared with the prior exam.  IMPRESSION: No significant change from the prior exam.   Electronically Signed   By: Inez Catalina M.D.   On: 04/08/2013 07:31   Dg Chest Port 1 View  04/07/2013   CLINICAL DATA:  Percutaneous endoscopic gastrostomy tube placement  EXAM: PORTABLE CHEST - 1 VIEW  COMPARISON:  DG CHEST 1V PORT dated 04/05/2013; DG CHEST 1V PORT dated 04/03/2013; DG CHEST 1V PORT dated 04/01/2013  FINDINGS: Grossly unchanged borderline enlarged cardiac silhouette and mediastinal contours given patient rotation. Stable position of support apparatus. No pneumothorax. No change to minimal decrease in suspected trace bilateral effusions. Bibasilar heterogeneous opacities have minimally worsened in the interval. Mild poor venous congestion without frank evidence of edema. Unchanged bones.  IMPRESSION: 1.  Stable positioning of support apparatus.  No pneumothorax. 2. Worsening bibasilar opacities, atelectasis versus infiltrate. 3. No change to minimal decrease in suspected trace bilateral effusions. Pulmonary venous congestion without frank evidence of edema.   Electronically Signed   By: Sandi Mariscal M.D.   On: 04/07/2013 08:04   Dg Abd Portable 1v  04/07/2013   CLINICAL DATA:  Left lower quadrant pain  EXAM: PORTABLE ABDOMEN - 1 VIEW  COMPARISON:  None.  FINDINGS: The bowel gas pattern is normal. No radio-opaque calculi or other significant radiographic abnormality are seen. Degenerative joint changes of the spine are noted. A feeding tube is projected over the mid  abdomen.  IMPRESSION: No bowel obstruction.   Electronically Signed   By: Abelardo Diesel M.D.   On: 04/07/2013 00:16    Medications / Allergies: per chart  Antibiotics: Anti-infectives   Start     Dose/Rate Route Frequency Ordered Stop   03/24/13 0800  ciprofloxacin (CIPRO) IVPB 400 mg  Status:  Discontinued     400  mg 200 mL/hr over 60 Minutes Intravenous Every 24 hours 03/23/13 1241 04/02/13 0839   03/23/13 1700  metroNIDAZOLE (FLAGYL) IVPB 500 mg  Status:  Discontinued     500 mg 100 mL/hr over 60 Minutes Intravenous Every 8 hours 03/23/13 1236 04/02/13 0839   03/23/13 0730  [MAR Hold]  ciprofloxacin (CIPRO) IVPB 400 mg     (On MAR Hold since 03/23/13 0805)   400 mg 200 mL/hr over 60 Minutes Intravenous On call to O.R. 03/23/13 0728 03/23/13 0824   03/23/13 0730  [MAR Hold]  metroNIDAZOLE (FLAGYL) IVPB 500 mg     (On MAR Hold since 03/23/13 0805)   500 mg 100 mL/hr over 60 Minutes Intravenous On call to O.R. 03/23/13 4799 03/23/13 0910       Assessment/Plan  SBO S/P SBR POD#16  -tolerating TF -swallow evaluation -bid wet to Acute respiratory failure-s/p trach.  Still requiring vent support at night.  Wean per CCM.  Appreciate assistance  Sepsis  Anemia of critical illness-h&h down to 7.5/22.5 today. AKI-resolved S/P PEG, getting TFs at goal rate. CT suggests ileus, but pt has diarrhea.  ID - off ABX, WBC down 18.2--->.  CT of abdomen noted post op changes, no abscess.  Suggests infectious or inflammatory colitis, c diff negative 2/9. Deconditioned - PT/OT, up to chair.  PCM- TF via PEG, hyponatremia improved.  Supplement K today.  continue with Imodium and flexiseal.   DM-CBGs, lantus and novolog VTE - SCDs, lovenox  Dispo - ICU  Erby Pian, [facility] Surgery Pager [phone] Office [phone]  04/08/2013 10:26 AM

## 2013-04-08 NOTE — Progress Notes (Signed)
Hypoglycemic Event  CBG: 68  Treatment: D50 IV 25 mL  Symptoms: None  Follow-up CBG: Time:2020 CBG Result:115  Possible Reasons for Event: Unknown  Comments/MD notified:per protocol    Dillard Essex  Remember to initiate Hypoglycemia Order Set & complete

## 2013-04-08 NOTE — Progress Notes (Signed)
Pt was moved to chair, then pt desat to 85%.  Increased ATC  fio2 to 40%, sats 96%

## 2013-04-08 NOTE — Progress Notes (Signed)
UR Completed.  Vergie Living [phone] 04/08/2013

## 2013-04-08 NOTE — Progress Notes (Signed)
Some colitis on CT, no abscess. Weaning as able per CCM. Patient examined and I agree with the assessment and plan  Georganna Skeans, MD, MPH, FACS Pager: [phone]  04/08/2013 10:47 AM

## 2013-04-08 NOTE — Progress Notes (Signed)
SLP Cancellation Note  Patient Details Name: Kristi Alexander MRN: [id number] DOB: 07-30-1925   Cancelled treatment:   Pt desaturating today; Fi02 increased, cuff inflated, and secretions worsened.  After discussion with RN, decided to defer PMSV today.  Will follow Monday, 2/16.  If pt is appropriate for investigation of swallow function, please order.        Juan Quam Laurice 04/08/2013, 3:48 PM

## 2013-04-08 NOTE — Progress Notes (Signed)
Physical Therapy Treatment Patient Details Name: Kristi Alexander MRN: [id number] DOB: 1925-03-22 Today's Date: 04/08/2013 Time: 4967-5916 PT Time Calculation (min): 27 min  PT Assessment / Plan / Recommendation  History of Present Illness Pt s/p exp. lap and resection of 4 foot section of necrotic bowel.  pt back to room intubated.  Extubated 1/29.  Trached 2/6 due to pt having trouble protecting airway.   PT Comments   Pt very motivated to walk and go farther than her last walk. Required several standing rest breaks, however did not sit until she reached 165ft of walking. Educated on simple LE exercises to continue 3x/day.   Follow Up Recommendations  SNF     Does the patient have the potential to tolerate intense rehabilitation     Barriers to Discharge        Equipment Recommendations  Other (comment) (TBA at next venue)    Recommendations for Other Services    Frequency Min 3X/week   Progress towards PT Goals Progress towards PT goals: Progressing toward goals  Plan Current plan remains appropriate    Precautions / Restrictions Precautions Precautions: Fall Restrictions Weight Bearing Restrictions: No   Pertinent Vitals/Pain SaO2 95% while walking on 31% FiO2 via trach    Mobility  Transfers Overall transfer level: Needs assistance Equipment used: Rolling walker (2 wheeled) Transfers: Sit to/from Stand Sit to Stand: Min assist General transfer comment: cues for hand placement and safety; assist to come forward and stand Ambulation/Gait Ambulation/Gait assistance: Min assist;+2 safety/equipment Ambulation Distance (Feet): 100 Feet (3 standing rest breaks) Assistive device: Rolling walker (2 wheeled) (pushing w/c) Gait Pattern/deviations: Step-through pattern;Decreased stride length Gait velocity: slow General Gait Details: good use of RW with upright posture; required rests due to dyspnea (and some leg fatigue)    Exercises General Exercises - Lower  Extremity Ankle Circles/Pumps: AROM;Both;15 reps;Seated Quad Sets: AROM;Both;5 reps;Seated   PT Diagnosis:    PT Problem List:   PT Treatment Interventions:     PT Goals (current goals can now be found in the care plan section) Acute Rehab PT Goals PT Goal Formulation: With patient Time For Goal Achievement: 04/15/13 Potential to Achieve Goals: Good  Visit Information  Last PT Received On: 04/08/13 Assistance Needed: +2 History of Present Illness: Pt s/p exp. lap and resection of 4 foot section of necrotic bowel.  pt back to room intubated.  Extubated 1/29.  Trached 2/6 due to pt having trouble protecting airway.    Subjective Data      Cognition  Cognition Arousal/Alertness: Awake/alert Behavior During Therapy: WFL for tasks assessed/performed Overall Cognitive Status: Within Functional Limits for tasks assessed    Balance  Balance Standing balance-Leahy Scale: Poor Standing balance comment: requires UE support for standing  End of Session PT - End of Session Equipment Utilized During Treatment: Oxygen;Gait belt Activity Tolerance: Patient tolerated treatment well Patient left: in chair;with call bell/phone within reach Nurse Communication: Mobility status   GP     SASSER,LYNN 04/08/2013, 1:40 PM Pager [phone]

## 2013-04-08 NOTE — Progress Notes (Signed)
Name: Kristi Alexander MRN: [id number] DOB: 1925-12-29    ADMISSION DATE:  03/22/2013 CONSULTATION DATE:  1/28  REFERRING MD :  Dr Hulen Skains PRIMARY SERVICE: [facility]  CHIEF COMPLAINT:  Vent and medical management   BRIEF PATIENT DESCRIPTION: 80 yo female with hx HTN admitted 1/27 with SBO.  Initially managed conservatively but cont to have worsening abd pain and increased WBC and was taken to OR 1/28 for exp lap and bowel resection.  Remained on vent post op and PCCM consulted.   SIGNIFICANT EVENTS / STUDIES:  1/27 admitted with SBO, conservative management  1/28 necrotic bowel suspected, to OR, 3-4 feet frankly necrotic bowel removed (no perforation)  2/01 Re-intubated; CT abd >> possible adhesions causing obstruction of SB, colon thickening ?infection or ischemia 2/08 On vent overnight ? Mucus plug 2/09 Off vent 2/10 on vent overnight >> mucus plug, transfuse 1 unit PRBC 2/11 on vent overnight for being worn out 2/12 unable to sleep off vent in early hours of AM, working too hard, placed back on vent 2/12 CT abd/pelvis >> SB ileus, ? Colitis transverse colon  LINES / TUBES: ETT 1/28>>>1/29; 2/1>>> 2/5, 2/5 >>2/6 Trach 2/6 >> L brachial aline 1/28>>> out Foley 1/28 >>> Rt PICC 2/1 >> Gastrostomy 2/6 >>>  CULTURES: Urine 1/28>>> negative C.Diff >>> Neg  ANTIBIOTICS: Flagyl 1/28>>>2/7 Cipro 1/29 >> 2/7  SUBJECTIVE:  Needed vent overnight.  On TC now.  Still has respiratory secretions.  VITAL SIGNS: Temp:  [97.9 F (36.6 C)-98.6 F (37 C)] 98.4 F (36.9 C) (02/13 0716) Pulse Rate:  [56-101] 85 (02/13 1026) Resp:  [0-36] 36 (02/13 1026) BP: (121-180)/(43-109) 153/65 mmHg (02/13 0900) SpO2:  [92 %-100 %] 92 % (02/13 1026) FiO2 (%):  [28 %-40 %] 40 % (02/13 1026) Weight:  [169 lb 5 oz (76.8 kg)] 169 lb 5 oz (76.8 kg) (02/13 0500)   INTAKE / OUTPUT: Intake/Output     02/12 0701 - 02/13 0700 02/13 0701 - 02/14 0700   I.V. (mL/kg) 1560 (20.3) 200 (2.6)   Other 30     NG/GT 1920.8    Total Intake(mL/kg) 3510.8 (45.7) 200 (2.6)   Urine (mL/kg/hr) 2640 (1.4) 250 (0.8)   Stool 500 (0.3)    Total Output 3140 250   Net +370.8 -50          PHYSICAL EXAMINATION: General:  No distress, sitting in chair Neuro:  Awake, follows commands HEENT: trach site C/D/I. Cardiovascular: RRR, no M/R/G. Lungs: scattered rhonchi, no wheeze.  Resp's even and unlabored. Abdomen: non tender Musculoskeletal: no edema  LABS:  CBC  Recent Labs Lab 04/06/13 0500 04/07/13 0415 04/08/13 0414  WBC 13.1* 18.2* 12.5*  HGB 8.0* 8.7* 7.5*  HCT 25.1* 27.0* 22.5*  PLT 386 398 343   BMET  Recent Labs Lab 04/06/13 0500 04/07/13 0415 04/08/13 0414  NA 151* 148* 143  K 3.7 3.6* 3.1*  CL 115* 111 107  CO2 26 26 26   BUN 35* 27* 19  CREATININE 0.90 0.80 0.76  GLUCOSE 142* 190* 129*   Electrolytes  Recent Labs Lab 04/02/13 0459  04/06/13 0500 04/07/13 0415 04/08/13 0414  CALCIUM 8.7  < > 8.2* 7.7* 7.6*  MG 1.7  --   --   --   --   < > = values in this interval not displayed.  Glucose  Recent Labs Lab 04/07/13 1140 04/07/13 1548 04/07/13 1916 04/07/13 2314 04/08/13 0318 04/08/13 0717  GLUCAP 114* 107* 88 130* 136* 101*  Imaging Ct Abdomen Pelvis W Contrast  04/07/2013   CLINICAL DATA:  Evaluate for abscess.  S/P surgery.  SBO  EXAM: CT ABDOMEN AND PELVIS WITH CONTRAST  TECHNIQUE: Multidetector CT imaging of the abdomen and pelvis was performed using the standard protocol following bolus administration of intravenous contrast.  CONTRAST:  5mL OMNIPAQUE IOHEXOL 300 MG/ML  SOLN  COMPARISON:  DG ABD PORTABLE 1V dated 04/06/2013; CT ABD/PELV WO CM dated 03/27/2013; CT ABD/PELVIS W CM dated 03/22/2013  FINDINGS: Evaluation lung bases demonstrates small bilateral pleural effusions slightly decreased in size compared to previous study. There is diffuse thickening of the interstitial markings which is also decreased in conspicuity when compared to previous study.  Multi chamber cardiac enlargement is appreciated.  The liver, spleen, right adrenal, and kidneys are unremarkable. A stable 2 cm nodule is identified along the lower pole of the left adrenal. The finding is stable when compared to prior studies and again statistically likely represents an adenoma with os indeterminate CT characteristics. If clinically warranted this finding may be further characterized with MRI, adrenal protocol.  Multiple dilated loops of small bowel are appreciated within the central and lower abdomen containing contrast. Contrast is appreciated within loops of nondilated large bowel.  There is evidence of bowel wall thickening involving the mid to distal aspect of the transverse colon. Inflammatory change is identified within the adjacent mesenteric fat. A small fluid attenuating focus projects deep to the umbilicus image 48 series 2 measuring 2.9 x 1 cm. This finding is adjacent to the mid transverse colon. Differential considerations are post- operative changes. There is diffuse diverticulosis within the transverse colon.  There is otherwise no further evidence of loculated fluid collections. Mild diffuse free fluid is appreciated within the abdomen as well as mild mesenteric edema.  Evaluation gallbladder demonstrates a partial calcified gallstone in the dependent portion of gallbladder. Small amount of pericholecystic fluid is appreciated.  There is no evidence of abdominal aortic aneurysm. Atherosclerotic calcifications are identified within the abdominal aorta and mesenteric vessels.  There is no evidence of abdominal or pelvic masses nor adenopathy.  Evaluation pelvis demonstrates a small amount of free fluid within the dependent portions of the pelvis. A rectal tube is identified with balloon insufflated in the rectum.  A small fat containing left inguinal hernia is identified. A small fat containing umbilical hernia is also appreciated.  There is evidence of infiltration of the  subcutaneous fat of the abdominal and pelvic wall.  A percutaneous gastric feeding tube projects in the region the body of the stomach.  There is not appear to be evidence of aggressive appearing osseous lesions multilevel spondylosis is appreciated within the spine.  IMPRESSION: 1. CT findings consistent with a small bowel ileus without further evidence of obstruction. 2. Postoperative changes identified within the abdomen. The small focal fluid collection deep to the umbilicus likely represents postoperative change. 3. Findings concerning for colitis infectious or inflammatory involving the transverse colon. These findings may reflect the sequela of diverticulitis considering the diffuse diverticulosis which is present. These findings are best appreciated on images 40 through 45 series 2. 4. Pericholecystic fluid likely postsurgical. Gallstone is identified within the gallbladder. 5. Improving bilateral effusions and decreased conspicuity of the areas of dependent atelectasis. The interstitial findings have also decreased in conspicuity likely reflecting improving pulmonary edema.   Electronically Signed   By: Margaree Mackintosh M.D.   On: 04/07/2013 16:55   Dg Chest Port 1 View  04/08/2013   CLINICAL DATA:  Hypertension, check infiltrate/effusion  EXAM: PORTABLE CHEST - 1 VIEW  COMPARISON:  04/07/2013  FINDINGS: The tracheostomy tube is again identified in satisfactory position. A right-sided PICC line is noted at the cavoatrial junction. The cardiac shadow is stable. Bibasilar infiltrates are noted right greater than left with associated effusion. No new focal abnormality is seen when compared with the prior exam.  IMPRESSION: No significant change from the prior exam.   Electronically Signed   By: Inez Catalina M.D.   On: 04/08/2013 07:31   Dg Chest Port 1 View  04/07/2013   CLINICAL DATA:  Percutaneous endoscopic gastrostomy tube placement  EXAM: PORTABLE CHEST - 1 VIEW  COMPARISON:  DG CHEST 1V PORT dated  04/05/2013; DG CHEST 1V PORT dated 04/03/2013; DG CHEST 1V PORT dated 04/01/2013  FINDINGS: Grossly unchanged borderline enlarged cardiac silhouette and mediastinal contours given patient rotation. Stable position of support apparatus. No pneumothorax. No change to minimal decrease in suspected trace bilateral effusions. Bibasilar heterogeneous opacities have minimally worsened in the interval. Mild poor venous congestion without frank evidence of edema. Unchanged bones.  IMPRESSION: 1.  Stable positioning of support apparatus.  No pneumothorax. 2. Worsening bibasilar opacities, atelectasis versus infiltrate. 3. No change to minimal decrease in suspected trace bilateral effusions. Pulmonary venous congestion without frank evidence of edema.   Electronically Signed   By: Sandi Mariscal M.D.   On: 04/07/2013 08:04   Dg Abd Portable 1v  04/07/2013   CLINICAL DATA:  Left lower quadrant pain  EXAM: PORTABLE ABDOMEN - 1 VIEW  COMPARISON:  None.  FINDINGS: The bowel gas pattern is normal. No radio-opaque calculi or other significant radiographic abnormality are seen. Degenerative joint changes of the spine are noted. A feeding tube is projected over the mid abdomen.  IMPRESSION: No bowel obstruction.   Electronically Signed   By: Abelardo Diesel M.D.   On: 04/07/2013 00:16    ASSESSMENT / PLAN:  PULMONARY A: Acute respiratory failure in setting of ischemic bowel and sepsis - Re-intubated 2/1 & again 2/5 for stridor ultimately required trach >> completed course of steroids for stridor. Small Pleural effusions >> unchanged. Intermittent mucus plugging. P:   -ATC as tolerated >> still needing vent support at night. Wean as able -PM valve as tolerated -bronchial hygiene -f/u CXR intermittently -BID xopenex  CARDIOVASCULAR A: Severe sepsis >> resolved. Hx of HTN. P:  -changed to lopressor per tube 2/10  RENAL A: Acute kidney injury (baseline creatinine 0.9 from 03/22/13) >> resolved. Non-anion gap metabolic  acidosis, lactic acidosis >> resolved. Hypervolemia >> improved. Hypernatremia >> improved from 2/11. Hypokalemia. P:   -discontinued D5W 2/10 >> restart 2/11 due to short gut may not be absorbing h2o and enhanced due to diarrhea.  -continue free water tube flushes -f/u renal fx, urine outpt, electrolytes  GASTROINTESTINAL A: SBO from ischemic bowel. Protein calorie malnutrition. Diarrhea (short gut ). Colitis, SB ileus on CT abd 2/12. P:   -post-op care per [facility] -continue tube feeds per [facility] -?if she could have swallow evaluation at some point in near future -protonix for SUP -flexiseal > imodium per [facility]  (c dif negative 2/9).  HEMATOLOGIC A: Anemia of critical illness >> no obvious sites for bleeding. P:  -trend CBC >> transfuse for Hb < 7 -lovenox for DVT prevention  INFECTIOUS A: Severe sepsis 2nd to ischemic bowel >> resolved. Leukocytosis - improved P:   -monitor WBC/fever curve.  ENDOCRINE A: Hyperglycemia. P:   -SSI with lantus 20 units daily(increased 2/11)  NEUROLOGIC A: Post op pain. Deconditioning P:   -PRN fentanyl. -PT/OT as able. -will likely need LTAC placement  Goals of care >> limited code >> no CPR, no defibrillation.  Chesley Mires, MD [facility] Pulmonary/Critical Care 04/08/2013, 11:12 AM Pager:  [phone] After 3pm call: [phone]

## 2013-04-09 LAB — BASIC METABOLIC PANEL
BUN: 14 mg/dL (ref 6–23)
CO2: 25 mEq/L (ref 19–32)
Calcium: 7.9 mg/dL — ABNORMAL LOW (ref 8.4–10.5)
Chloride: 107 mEq/L (ref 96–112)
Creatinine, Ser: 0.68 mg/dL (ref 0.50–1.10)
GFR calc Af Amer: 89 mL/min — ABNORMAL LOW (ref >90)
GFR, EST NON AFRICAN AMERICAN: 77 mL/min — AB (ref >90)
Glucose, Bld: 122 mg/dL — ABNORMAL HIGH (ref 70–99)
POTASSIUM: 3.3 meq/L — AB (ref 3.7–5.3)
Sodium: 141 mEq/L (ref 137–147)

## 2013-04-09 LAB — URINALYSIS, ROUTINE W REFLEX MICROSCOPIC
Bilirubin Urine: NEGATIVE
Glucose, UA: NEGATIVE mg/dL
Hgb urine dipstick: NEGATIVE
Ketones, ur: NEGATIVE mg/dL
LEUKOCYTES UA: NEGATIVE
Nitrite: NEGATIVE
PH: 6.5 (ref 5.0–8.0)
PROTEIN: NEGATIVE mg/dL
Specific Gravity, Urine: 1.016 (ref 1.005–1.030)
Urobilinogen, UA: 2 mg/dL — ABNORMAL HIGH (ref 0.0–1.0)

## 2013-04-09 LAB — GLUCOSE, CAPILLARY
GLUCOSE-CAPILLARY: 68 mg/dL — AB (ref 70–99)
Glucose-Capillary: 128 mg/dL — ABNORMAL HIGH (ref 70–99)
Glucose-Capillary: 122 mg/dL — ABNORMAL HIGH (ref 70–99)
Glucose-Capillary: 131 mg/dL — ABNORMAL HIGH (ref 70–99)
Glucose-Capillary: 161 mg/dL — ABNORMAL HIGH (ref 70–99)
Glucose-Capillary: 121 mg/dL — ABNORMAL HIGH (ref 70–99)
Glucose-Capillary: 76 mg/dL (ref 70–99)
Glucose-Capillary: 97 mg/dL (ref 70–99)

## 2013-04-09 LAB — CBC
HCT: 27.6 % — ABNORMAL LOW (ref 36.0–46.0)
Hemoglobin: 8.8 g/dL — ABNORMAL LOW (ref 12.0–15.0)
MCH: 25.4 pg — AB (ref 26.0–34.0)
MCHC: 31.9 g/dL (ref 30.0–36.0)
MCV: 79.8 fL (ref 78.0–100.0)
Platelets: 281 10*3/uL (ref 150–400)
RBC: 3.46 MIL/uL — AB (ref 3.87–5.11)
RDW: 22.1 % — ABNORMAL HIGH (ref 11.5–15.5)
WBC: 12.7 10*3/uL — ABNORMAL HIGH (ref 4.0–10.5)

## 2013-04-09 MED ORDER — POTASSIUM CHLORIDE 10 MEQ/100ML IV SOLN: 10.0000 meq | INTRAVENOUS | Status: DC

## 2013-04-09 MED ORDER — POTASSIUM CHLORIDE 10 MEQ/50ML IV SOLN
10.0000 meq | INTRAVENOUS | Status: AC
  Administered 2013-04-09 (×5): 10 meq via INTRAVENOUS
  Filled 2013-04-09 (×4): qty 50

## 2013-04-09 MED ORDER — INSULIN GLARGINE 100 UNIT/ML ~~LOC~~ SOLN
15.0000 [IU] | Freq: Every day | SUBCUTANEOUS | Status: DC
  Administered 2013-04-10 – 2013-04-12 (×3): 15 [IU] via SUBCUTANEOUS
  Filled 2013-04-09 (×3): qty 0.15

## 2013-04-09 NOTE — Progress Notes (Signed)
eLink Physician-Brief Progress Note Patient Name: Kristi Alexander DOB: [birth year] MRN: [id number]  Date of Service  04/09/2013   HPI/Events of Note   Hypokalemia 3.3  eICU Interventions  Replaced IV      Mauri Brooklyn, P 04/09/2013, 5:27 AM

## 2013-04-09 NOTE — Progress Notes (Signed)
Patient rang call bell, mouths words "I can't breathe"!  Patient extremely anxious, tachypneic, using accessory muscles to breath.  On 35% trach collar.  Respiratory therapy paged, placed back on vent.  Patient mouths she is "tired".  Foley DC'd yesterday, patient has been incontinent of urine since.  This AM has required hourly peri care to prevent skin breakdown/provide patient comfort. Foley Huddle held.  Discussed risk of skin breakdown, tiring patient and causing further decompensation, vent dependence.  Marko Plume NP notified of all parameters. Orders rec'd. 14 fr foley inserted.  UA sent.  Patient currently resting, in no acute distress.

## 2013-04-09 NOTE — Progress Notes (Signed)
Called to bedside by RN d/t pt tachypnic, increased WOB.  Placed pt back on vent- full support.  Pt mouths "feels better".  RN aware.

## 2013-04-09 NOTE — Progress Notes (Signed)
Name: Kristi Alexander MRN: [id number] DOB: 07-04-1925    ADMISSION DATE:  03/22/2013 CONSULTATION DATE:  1/28  REFERRING MD :  Dr Hulen Skains PRIMARY SERVICE: [facility]  CHIEF COMPLAINT:  Vent and medical management   BRIEF PATIENT DESCRIPTION: 80 yo female with hx HTN admitted 1/27 with SBO.  Initially managed conservatively but cont to have worsening abd pain and increased WBC and was taken to OR 1/28 for exp lap and bowel resection.  Remained on vent post op and PCCM consulted.   SIGNIFICANT EVENTS / STUDIES:  1/27 admitted with SBO, conservative management  1/28 necrotic bowel suspected, to OR, 3-4 feet frankly necrotic bowel removed (no perforation)  2/01 Re-intubated; CT abd >> possible adhesions causing obstruction of SB, colon thickening ?infection or ischemia 2/08 On vent overnight ? Mucus plug 2/09 Off vent 2/10 on vent overnight >> mucus plug, transfuse 1 unit PRBC 2/11 on vent overnight for being worn out 2/12 unable to sleep off vent in early hours of AM, working too hard, placed back on vent 2/12 CT abd/pelvis >> SB ileus, ? Colitis transverse colon  LINES / TUBES: ETT 1/28>>>1/29; 2/1>>> 2/5, 2/5 >>2/6 Trach 2/6 >> L brachial aline 1/28>>> out Foley 1/28 >>> Rt PICC 2/1 >> Gastrostomy 2/6 >>>  CULTURES: Urine 1/28>>> negative C.Diff >>> Neg   ANTIBIOTICS: Flagyl 1/28>>>2/7 Cipro 1/29 >> 2/7  SUBJECTIVE:  Needed vent overnight.   TC today , increased WOB this evening , placed back on vent  Increased urinary incontinence , foley placed   VITAL SIGNS: Temp:  [98.3 F (36.8 C)-99.5 F (37.5 C)] 99.1 F (37.3 C) (02/14 1153) Pulse Rate:  [31-99] 74 (02/14 1235) Resp:  [24-49] 33 (02/14 1235) BP: (105-180)/(44-129) 153/60 mmHg (02/14 1235) SpO2:  [90 %-100 %] 95 % (02/14 1235) FiO2 (%):  [30 %-40 %] 35 % (02/14 1235) Weight:  [77.6 kg (171 lb 1.2 oz)] 77.6 kg (171 lb 1.2 oz) (02/14 0500)   INTAKE / OUTPUT: Intake/Output     02/13 0701 - 02/14 0700 02/14  0701 - 02/15 0700   I.V. (mL/kg) 1450 (18.7) 250 (3.2)   Other 120    NG/GT 2400 250   IV Piggyback 300 150   Total Intake(mL/kg) 4270 (55) 650 (8.4)   Urine (mL/kg/hr) 750 (0.4)    Stool 75 (0)    Total Output 825     Net +3445 +650        Urine Occurrence 8 x 4 x   Stool Occurrence 3 x      PHYSICAL EXAMINATION: General: resting on vent  Neuro:  Awake, follows commands  HEENT: trach site C/D/I. Cardiovascular: RRR, no M/R/G. Lungs: scattered rhonchi, no wheeze.  Resp's even and unlabored. Abdomen: non tender Musculoskeletal: no edema  LABS:  CBC  Recent Labs Lab 04/07/13 0415 04/08/13 0414 04/09/13 0400  WBC 18.2* 12.5* 12.7*  HGB 8.7* 7.5* 8.8*  HCT 27.0* 22.5* 27.6*  PLT 398 343 281   BMET  Recent Labs Lab 04/07/13 0415 04/08/13 0414 04/09/13 0400  NA 148* 143 141  K 3.6* 3.1* 3.3*  CL 111 107 107  CO2 26 26 25   BUN 27* 19 14  CREATININE 0.80 0.76 0.68  GLUCOSE 190* 129* 122*   Electrolytes  Recent Labs Lab 04/07/13 0415 04/08/13 0414 04/09/13 0400  CALCIUM 7.7* 7.6* 7.9*    Glucose  Recent Labs Lab 04/08/13 2021 04/08/13 2355 04/09/13 0001 04/09/13 0338 04/09/13 0723 04/09/13 0807  GLUCAP 115* 65* 128* 122*  161* 131*    Imaging Ct Abdomen Pelvis W Contrast  04/07/2013   CLINICAL DATA:  Evaluate for abscess.  S/P surgery.  SBO  EXAM: CT ABDOMEN AND PELVIS WITH CONTRAST  TECHNIQUE: Multidetector CT imaging of the abdomen and pelvis was performed using the standard protocol following bolus administration of intravenous contrast.  CONTRAST:  4mL OMNIPAQUE IOHEXOL 300 MG/ML  SOLN  COMPARISON:  DG ABD PORTABLE 1V dated 04/06/2013; CT ABD/PELV WO CM dated 03/27/2013; CT ABD/PELVIS W CM dated 03/22/2013  FINDINGS: Evaluation lung bases demonstrates small bilateral pleural effusions slightly decreased in size compared to previous study. There is diffuse thickening of the interstitial markings which is also decreased in conspicuity when compared  to previous study. Multi chamber cardiac enlargement is appreciated.  The liver, spleen, right adrenal, and kidneys are unremarkable. A stable 2 cm nodule is identified along the lower pole of the left adrenal. The finding is stable when compared to prior studies and again statistically likely represents an adenoma with os indeterminate CT characteristics. If clinically warranted this finding may be further characterized with MRI, adrenal protocol.  Multiple dilated loops of small bowel are appreciated within the central and lower abdomen containing contrast. Contrast is appreciated within loops of nondilated large bowel.  There is evidence of bowel wall thickening involving the mid to distal aspect of the transverse colon. Inflammatory change is identified within the adjacent mesenteric fat. A small fluid attenuating focus projects deep to the umbilicus image 48 series 2 measuring 2.9 x 1 cm. This finding is adjacent to the mid transverse colon. Differential considerations are post- operative changes. There is diffuse diverticulosis within the transverse colon.  There is otherwise no further evidence of loculated fluid collections. Mild diffuse free fluid is appreciated within the abdomen as well as mild mesenteric edema.  Evaluation gallbladder demonstrates a partial calcified gallstone in the dependent portion of gallbladder. Small amount of pericholecystic fluid is appreciated.  There is no evidence of abdominal aortic aneurysm. Atherosclerotic calcifications are identified within the abdominal aorta and mesenteric vessels.  There is no evidence of abdominal or pelvic masses nor adenopathy.  Evaluation pelvis demonstrates a small amount of free fluid within the dependent portions of the pelvis. A rectal tube is identified with balloon insufflated in the rectum.  A small fat containing left inguinal hernia is identified. A small fat containing umbilical hernia is also appreciated.  There is evidence of  infiltration of the subcutaneous fat of the abdominal and pelvic wall.  A percutaneous gastric feeding tube projects in the region the body of the stomach.  There is not appear to be evidence of aggressive appearing osseous lesions multilevel spondylosis is appreciated within the spine.  IMPRESSION: 1. CT findings consistent with a small bowel ileus without further evidence of obstruction. 2. Postoperative changes identified within the abdomen. The small focal fluid collection deep to the umbilicus likely represents postoperative change. 3. Findings concerning for colitis infectious or inflammatory involving the transverse colon. These findings may reflect the sequela of diverticulitis considering the diffuse diverticulosis which is present. These findings are best appreciated on images 40 through 45 series 2. 4. Pericholecystic fluid likely postsurgical. Gallstone is identified within the gallbladder. 5. Improving bilateral effusions and decreased conspicuity of the areas of dependent atelectasis. The interstitial findings have also decreased in conspicuity likely reflecting improving pulmonary edema.   Electronically Signed   By: Margaree Mackintosh M.D.   On: 04/07/2013 16:55   Dg Chest Port 1 View  04/08/2013  CLINICAL DATA:  Hypertension, check infiltrate/effusion  EXAM: PORTABLE CHEST - 1 VIEW  COMPARISON:  04/07/2013  FINDINGS: The tracheostomy tube is again identified in satisfactory position. A right-sided PICC line is noted at the cavoatrial junction. The cardiac shadow is stable. Bibasilar infiltrates are noted right greater than left with associated effusion. No new focal abnormality is seen when compared with the prior exam.  IMPRESSION: No significant change from the prior exam.   Electronically Signed   By: Inez Catalina M.D.   On: 04/08/2013 07:31    ASSESSMENT / PLAN:  PULMONARY A: Acute respiratory failure in setting of ischemic bowel and sepsis - Re-intubated 2/1 & again 2/5 for stridor  ultimately required trach >> completed course of steroids for stridor. Small Pleural effusions >> unchanged. Intermittent mucus plugging. P:   -ATC as tolerated >> still needing vent support at night. Wean as able -PM valve as tolerated -bronchial hygiene -f/u CXR intermittently -BID xopenex  CARDIOVASCULAR A: Severe sepsis >> resolved. Hx of HTN. P:  -changed to lopressor per tube 2/10  RENAL A: Acute kidney injury (baseline creatinine 0.9 from 03/22/13) >> resolved. Non-anion gap metabolic acidosis, lactic acidosis >> resolved. Hypervolemia >> improved. Hypernatremia >> improved from 2/11. Hypokalemia. P:   - d/c D5W -continue free water tube flushes -f/u renal fx, urine outpt, electrolytes  GASTROINTESTINAL A: SBO from ischemic bowel. Protein calorie malnutrition. Diarrhea (short gut ). Colitis, SB ileus on CT abd 2/12. P:   -post-op care per [facility] -continue tube feeds per [facility] -?if she could have swallow evaluation at some point in near future -protonix for SUP -flexiseal > imodium per [facility]  (c dif negative 2/9).  HEMATOLOGIC A: Anemia of critical illness >> no obvious sites for bleeding. P:  -trend CBC >> transfuse for Hb < 7 -lovenox for DVT prevention  INFECTIOUS A: Severe sepsis 2nd to ischemic bowel >> resolved. Leukocytosis - improved P:   -monitor WBC/fever curve.  ENDOCRINE A: Hyperglycemia. 2/14 low BS  P:   -SSI  -decrease Lantus 15 u   NEUROLOGIC A: Post op pain. Deconditioning P:   -PRN fentanyl. -PT/OT as able. -will likely need LTAC placement  Goals of care >> limited code >> no CPR, no defibrillation.  Tammy Parrett NP-C  [facility]  [phone]   I have personally obtained history, examined patient, evaluated and interpreted laboratory and imaging results, reviewed medical records, formulated assessment / plan and placed orders.  CRITICAL CARE:  The patient is critically ill with multiple organ  systems failure and requires high complexity decision making for assessment and support, frequent evaluation and titration of therapies, application of advanced monitoring technologies and extensive interpretation of multiple databases. Critical Care Time devoted to patient care services described in this note is 35 minutes.   Doree Fudge, MD Pulmonary and [place] Pager: [phone]  04/09/2013, 4:35 PM

## 2013-04-09 NOTE — Progress Notes (Signed)
Patient ID: Kristi Alexander, female   DOB: 01/11/1926, 80 y.o.   MRN: [id number]  Subjective: Requiring some vent support at night.  flexiseal in place, still having loose stools.    Objective:  Vital signs:  Filed Vitals:   04/09/13 1100 04/09/13 1153 04/09/13 1200 04/09/13 1235  BP: 162/44  153/60 153/60  Pulse:   75 74  Temp:  99.1 F (37.3 C)    TempSrc:  Oral    Resp: 40  35 33  Height:      Weight:      SpO2: 100%  99% 95%    Last BM Date: 04/08/13  Intake/Output   Yesterday:  02/13 0701 - 02/14 0700 In: 4270 [I.V.:1450; NG/GT:2400; IV Piggyback:300] Out: 825 [Urine:750; Stool:75] This shift:  Total I/O In: 650 [I.V.:250; NG/GT:250; IV Piggyback:150] Out: -   Physical Exam:  General: Pt awake/alert Cards: rrr.   Trace edema.   Lungs: course throughout   Abdomen: Soft. Nondistended. Mild tenderness at incision only. Inferior aspect of wound open, apply 2x2 to area, very superficial without evidence of site infection of abscess. No evidence of peritonitis.   Problem List:   Active Problems:   SBO (small bowel obstruction)   Acute respiratory failure   Sepsis   Acute renal failure    Results:   Labs: Results for orders placed during the hospital encounter of 03/22/13 (from the past 48 hour(s))  GLUCOSE, CAPILLARY     Status: Abnormal   Collection Time    04/07/13  3:48 PM      Result Value Ref Range   Glucose-Capillary 107 (*) 70 - 99 mg/dL   Comment 1 Documented in Chart     Comment 2 Notify RN    GLUCOSE, CAPILLARY     Status: None   Collection Time    04/07/13  7:16 PM      Result Value Ref Range   Glucose-Capillary 88  70 - 99 mg/dL   Comment 1 Documented in Chart     Comment 2 Notify RN    GLUCOSE, CAPILLARY     Status: Abnormal   Collection Time    04/07/13 11:14 PM      Result Value Ref Range   Glucose-Capillary 130 (*) 70 - 99 mg/dL   Comment 1 Documented in Chart     Comment 2 Notify RN    GLUCOSE, CAPILLARY     Status: Abnormal    Collection Time    04/08/13  3:18 AM      Result Value Ref Range   Glucose-Capillary 136 (*) 70 - 99 mg/dL   Comment 1 Documented in Chart     Comment 2 Notify RN    CBC     Status: Abnormal   Collection Time    04/08/13  4:14 AM      Result Value Ref Range   WBC 12.5 (*) 4.0 - 10.5 K/uL   RBC 2.85 (*) 3.87 - 5.11 MIL/uL   Hemoglobin 7.5 (*) 12.0 - 15.0 g/dL   HCT 22.5 (*) 36.0 - 46.0 %   MCV 78.9  78.0 - 100.0 fL   MCH 26.3  26.0 - 34.0 pg   MCHC 33.3  30.0 - 36.0 g/dL   RDW 21.5 (*) 11.5 - 15.5 %   Platelets 343  150 - 400 K/uL  BASIC METABOLIC PANEL     Status: Abnormal   Collection Time    04/08/13  4:14 AM      Result   Value Ref Range   Sodium 143  137 - 147 mEq/L   Potassium 3.1 (*) 3.7 - 5.3 mEq/L   Chloride 107  96 - 112 mEq/L   CO2 26  19 - 32 mEq/L   Glucose, Bld 129 (*) 70 - 99 mg/dL   BUN 19  6 - 23 mg/dL   Creatinine, Ser 0.76  0.50 - 1.10 mg/dL   Calcium 7.6 (*) 8.4 - 10.5 mg/dL   GFR calc non Af Amer 74 (*) >90 mL/min   GFR calc Af Amer 86 (*) >90 mL/min   Comment: (NOTE)     The eGFR has been calculated using the CKD EPI equation.     This calculation has not been validated in all clinical situations.     eGFR's persistently <90 mL/min signify possible Chronic Kidney     Disease.  GLUCOSE, CAPILLARY     Status: Abnormal   Collection Time    04/08/13  7:17 AM      Result Value Ref Range   Glucose-Capillary 101 (*) 70 - 99 mg/dL   Comment 1 Notify RN    GLUCOSE, CAPILLARY     Status: Abnormal   Collection Time    04/08/13 11:28 AM      Result Value Ref Range   Glucose-Capillary 119 (*) 70 - 99 mg/dL   Comment 1 Notify RN    GLUCOSE, CAPILLARY     Status: None   Collection Time    04/08/13  3:49 PM      Result Value Ref Range   Glucose-Capillary 73  70 - 99 mg/dL  GLUCOSE, CAPILLARY     Status: Abnormal   Collection Time    04/08/13  7:49 PM      Result Value Ref Range   Glucose-Capillary 68 (*) 70 - 99 mg/dL   Comment 1 Documented in Chart      Comment 2 Notify RN    GLUCOSE, CAPILLARY     Status: Abnormal   Collection Time    04/08/13  8:21 PM      Result Value Ref Range   Glucose-Capillary 115 (*) 70 - 99 mg/dL  GLUCOSE, CAPILLARY     Status: Abnormal   Collection Time    04/08/13 11:55 PM      Result Value Ref Range   Glucose-Capillary 65 (*) 70 - 99 mg/dL  GLUCOSE, CAPILLARY     Status: Abnormal   Collection Time    04/09/13 12:01 AM      Result Value Ref Range   Glucose-Capillary 128 (*) 70 - 99 mg/dL  GLUCOSE, CAPILLARY     Status: Abnormal   Collection Time    04/09/13  3:38 AM      Result Value Ref Range   Glucose-Capillary 122 (*) 70 - 99 mg/dL   Comment 1 Documented in Chart     Comment 2 Notify RN    BASIC METABOLIC PANEL     Status: Abnormal   Collection Time    04/09/13  4:00 AM      Result Value Ref Range   Sodium 141  137 - 147 mEq/L   Potassium 3.3 (*) 3.7 - 5.3 mEq/L   Chloride 107  96 - 112 mEq/L   CO2 25  19 - 32 mEq/L   Glucose, Bld 122 (*) 70 - 99 mg/dL   BUN 14  6 - 23 mg/dL   Creatinine, Ser 0.68  0.50 - 1.10 mg/dL   Calcium 7.9 (*) 8.4 -  10.5 mg/dL   GFR calc non Af Amer 77 (*) >90 mL/min   GFR calc Af Amer 89 (*) >90 mL/min   Comment: (NOTE)     The eGFR has been calculated using the CKD EPI equation.     This calculation has not been validated in all clinical situations.     eGFR's persistently <90 mL/min signify possible Chronic Kidney     Disease.  CBC     Status: Abnormal   Collection Time    04/09/13  4:00 AM      Result Value Ref Range   WBC 12.7 (*) 4.0 - 10.5 K/uL   RBC 3.46 (*) 3.87 - 5.11 MIL/uL   Hemoglobin 8.8 (*) 12.0 - 15.0 g/dL   HCT 27.6 (*) 36.0 - 46.0 %   MCV 79.8  78.0 - 100.0 fL   MCH 25.4 (*) 26.0 - 34.0 pg   MCHC 31.9  30.0 - 36.0 g/dL   RDW 22.1 (*) 11.5 - 15.5 %   Platelets 281  150 - 400 K/uL  GLUCOSE, CAPILLARY     Status: Abnormal   Collection Time    04/09/13  7:23 AM      Result Value Ref Range   Glucose-Capillary 161 (*) 70 - 99 mg/dL   GLUCOSE, CAPILLARY     Status: Abnormal   Collection Time    04/09/13  8:07 AM      Result Value Ref Range   Glucose-Capillary 131 (*) 70 - 99 mg/dL    Imaging / Studies: Ct Abdomen Pelvis W Contrast  04/07/2013   CLINICAL DATA:  Evaluate for abscess.  S/P surgery.  SBO  EXAM: CT ABDOMEN AND PELVIS WITH CONTRAST  TECHNIQUE: Multidetector CT imaging of the abdomen and pelvis was performed using the standard protocol following bolus administration of intravenous contrast.  CONTRAST:  80mL OMNIPAQUE IOHEXOL 300 MG/ML  SOLN  COMPARISON:  DG ABD PORTABLE 1V dated 04/06/2013; CT ABD/PELV WO CM dated 03/27/2013; CT ABD/PELVIS W CM dated 03/22/2013  FINDINGS: Evaluation lung bases demonstrates small bilateral pleural effusions slightly decreased in size compared to previous study. There is diffuse thickening of the interstitial markings which is also decreased in conspicuity when compared to previous study. Multi chamber cardiac enlargement is appreciated.  The liver, spleen, right adrenal, and kidneys are unremarkable. A stable 2 cm nodule is identified along the lower pole of the left adrenal. The finding is stable when compared to prior studies and again statistically likely represents an adenoma with os indeterminate CT characteristics. If clinically warranted this finding may be further characterized with MRI, adrenal protocol.  Multiple dilated loops of small bowel are appreciated within the central and lower abdomen containing contrast. Contrast is appreciated within loops of nondilated large bowel.  There is evidence of bowel wall thickening involving the mid to distal aspect of the transverse colon. Inflammatory change is identified within the adjacent mesenteric fat. A small fluid attenuating focus projects deep to the umbilicus image 48 series 2 measuring 2.9 x 1 cm. This finding is adjacent to the mid transverse colon. Differential considerations are post- operative changes. There is diffuse  diverticulosis within the transverse colon.  There is otherwise no further evidence of loculated fluid collections. Mild diffuse free fluid is appreciated within the abdomen as well as mild mesenteric edema.  Evaluation gallbladder demonstrates a partial calcified gallstone in the dependent portion of gallbladder. Small amount of pericholecystic fluid is appreciated.  There is no evidence of abdominal aortic aneurysm. Atherosclerotic   calcifications are identified within the abdominal aorta and mesenteric vessels.  There is no evidence of abdominal or pelvic masses nor adenopathy.  Evaluation pelvis demonstrates a small amount of free fluid within the dependent portions of the pelvis. A rectal tube is identified with balloon insufflated in the rectum.  A small fat containing left inguinal hernia is identified. A small fat containing umbilical hernia is also appreciated.  There is evidence of infiltration of the subcutaneous fat of the abdominal and pelvic wall.  A percutaneous gastric feeding tube projects in the region the body of the stomach.  There is not appear to be evidence of aggressive appearing osseous lesions multilevel spondylosis is appreciated within the spine.  IMPRESSION: 1. CT findings consistent with a small bowel ileus without further evidence of obstruction. 2. Postoperative changes identified within the abdomen. The small focal fluid collection deep to the umbilicus likely represents postoperative change. 3. Findings concerning for colitis infectious or inflammatory involving the transverse colon. These findings may reflect the sequela of diverticulitis considering the diffuse diverticulosis which is present. These findings are best appreciated on images 40 through 45 series 2. 4. Pericholecystic fluid likely postsurgical. Gallstone is identified within the gallbladder. 5. Improving bilateral effusions and decreased conspicuity of the areas of dependent atelectasis. The interstitial findings have  also decreased in conspicuity likely reflecting improving pulmonary edema.   Electronically Signed   By: Hector  Cooper M.D.   On: 04/07/2013 16:55   Dg Chest Port 1 View  04/08/2013   CLINICAL DATA:  Hypertension, check infiltrate/effusion  EXAM: PORTABLE CHEST - 1 VIEW  COMPARISON:  04/07/2013  FINDINGS: The tracheostomy tube is again identified in satisfactory position. A right-sided PICC line is noted at the cavoatrial junction. The cardiac shadow is stable. Bibasilar infiltrates are noted right greater than left with associated effusion. No new focal abnormality is seen when compared with the prior exam.  IMPRESSION: No significant change from the prior exam.   Electronically Signed   By: Mark  Lukens M.D.   On: 04/08/2013 07:31    Medications / Allergies: per chart  Antibiotics: Anti-infectives   Start     Dose/Rate Route Frequency Ordered Stop   03/24/13 0800  ciprofloxacin (CIPRO) IVPB 400 mg  Status:  Discontinued     400 mg 200 mL/hr over 60 Minutes Intravenous Every 24 hours 03/23/13 1241 04/02/13 0839   03/23/13 1700  metroNIDAZOLE (FLAGYL) IVPB 500 mg  Status:  Discontinued     500 mg 100 mL/hr over 60 Minutes Intravenous Every 8 hours 03/23/13 1236 04/02/13 0839   03/23/13 0730  [MAR Hold]  ciprofloxacin (CIPRO) IVPB 400 mg     (On MAR Hold since 03/23/13 0805)   400 mg 200 mL/hr over 60 Minutes Intravenous On call to O.R. 03/23/13 0728 03/23/13 0824   03/23/13 0730  [MAR Hold]  metroNIDAZOLE (FLAGYL) IVPB 500 mg     (On MAR Hold since 03/23/13 0805)   500 mg 100 mL/hr over 60 Minutes Intravenous On call to O.R. 03/23/13 0728 03/23/13 0910       Assessment/Plan  SBO S/P SBR POD#17  -tolerating TF -swallow evaluation  Acute respiratory failure-s/p trach.  Still requiring vent support at night.  Wean per CCM.  Appreciate assistance  Sepsis  Anemia of critical illness-h&h 8.8 AKI-resolved S/P PEG, getting TFs at goal rate. CT suggests ileus and possible focal colitis.     ID - off ABX, WBC down today.  CT of abdomen noted post op changes,   no abscess.  Suggests infectious or inflammatory colitis, c diff negative 2/9. Deconditioned - PT/OT, up to chair.  PCM- TF via PEG, hyponatremia improved.  Supplement K today.  continue with Imodium and flexiseal.   DM-CBGs, lantus and novolog VTE - SCDs, lovenox  Dispo - ICU  Rosario Adie, MD  [facility]   04/09/2013 12:43 PM

## 2013-04-09 NOTE — Progress Notes (Signed)
[facility] ADULT ICU REPLACEMENT PROTOCOL FOR AM LAB REPLACEMENT ONLY  The patient does not apply for the [facility] Adult ICU Electrolyte Replacment Protocol based on the criteria listed below:   1. Is GFR >/= 40 ml/min? no  Patient's GFR today is  2. Is urine output >/= 0.5 ml/kg/hr for the last 6 hours? no Patient's UOP is Urine Occurences 3. Is BUN < 60 mg/dL? no  Patient's BUN today is  4. Abnormal electrolyte(s):K 3.3 5. Ordered repletion with: NA 6. If a panic level lab has been reported, has the CCM MD in charge been notified? yes.   Physician:  Dr Smitty Pluck, [place] A 04/09/2013 5:17 AM

## 2013-04-10 LAB — BASIC METABOLIC PANEL
BUN: 14 mg/dL (ref 6–23)
CALCIUM: 7.9 mg/dL — AB (ref 8.4–10.5)
CO2: 27 mEq/L (ref 19–32)
CREATININE: 0.76 mg/dL (ref 0.50–1.10)
Chloride: 108 mEq/L (ref 96–112)
GFR calc non Af Amer: 74 mL/min — ABNORMAL LOW (ref >90)
GFR, EST AFRICAN AMERICAN: 86 mL/min — AB (ref >90)
Glucose, Bld: 105 mg/dL — ABNORMAL HIGH (ref 70–99)
Potassium: 3.8 mEq/L (ref 3.7–5.3)
Sodium: 144 mEq/L (ref 137–147)

## 2013-04-10 LAB — CBC
HCT: 21 % — ABNORMAL LOW (ref 36.0–46.0)
Hemoglobin: 6.8 g/dL — CL (ref 12.0–15.0)
MCH: 25.9 pg — AB (ref 26.0–34.0)
MCHC: 32.4 g/dL (ref 30.0–36.0)
MCV: 79.8 fL (ref 78.0–100.0)
PLATELETS: 301 10*3/uL (ref 150–400)
RBC: 2.63 MIL/uL — ABNORMAL LOW (ref 3.87–5.11)
RDW: 22.4 % — AB (ref 11.5–15.5)
WBC: 12.2 10*3/uL — AB (ref 4.0–10.5)

## 2013-04-10 LAB — GLUCOSE, CAPILLARY
GLUCOSE-CAPILLARY: 102 mg/dL — AB (ref 70–99)
GLUCOSE-CAPILLARY: 113 mg/dL — AB (ref 70–99)
GLUCOSE-CAPILLARY: 119 mg/dL — AB (ref 70–99)
Glucose-Capillary: 98 mg/dL (ref 70–99)
Glucose-Capillary: 112 mg/dL — ABNORMAL HIGH (ref 70–99)
Glucose-Capillary: 108 mg/dL — ABNORMAL HIGH (ref 70–99)
Glucose-Capillary: 94 mg/dL (ref 70–99)

## 2013-04-10 LAB — PREPARE RBC (CROSSMATCH)

## 2013-04-10 NOTE — Progress Notes (Signed)
Placed on rest mode due to increased RR, Increased WOB

## 2013-04-10 NOTE — Progress Notes (Signed)
9 Days Post-Op  Subjective: Pt with no acute changes.  Vent support overnight, back on TC this AM.  Objective: Vital signs in last 24 hours: Temp:  [98.5 F (36.9 C)-99.6 F (37.6 C)] 99 F (37.2 C) (02/15 0915) Pulse Rate:  [74-102] 102 (02/15 1000) Resp:  [15-40] 15 (02/15 1000) BP: (110-171)/(44-96) 154/87 mmHg (02/15 1000) SpO2:  [95 %-100 %] 98 % (02/15 1000) FiO2 (%):  [30 %-35 %] 35 % (02/15 1000) Last BM Date: 04/10/13  Intake/Output from previous day: 02/14 0701 - 02/15 0700 In: 2462.5 [I.V.:250; Blood:262.5; NG/GT:1800; IV Piggyback:150] Out: 1135 [Urine:1135] Intake/Output this shift: Total I/O In: 475 [Blood:125; NG/GT:350] Out: 550 [Urine:550]  General appearance: alert and cooperative Cardio: regular rate and rhythm, S1, S2 normal, no murmur, click, rub or gallop GI: wound c/d/i, Gtubein place/ soft, nttp Incision/Wound: c/d/i  Lab Results:   Recent Labs  04/09/13 0400 04/10/13 0415  WBC 12.7* 12.2*  HGB 8.8* 6.8*  HCT 27.6* 21.0*  PLT 281 301   BMET  Recent Labs  04/09/13 0400 04/10/13 0415  NA 141 144  K 3.3* 3.8  CL 107 108  CO2 25 27  GLUCOSE 122* 105*  BUN 14 14  CREATININE 0.68 0.76  CALCIUM 7.9* 7.9*   PT/INR No results found for this basename: LABPROT, INR,  in the last 72 hours ABG No results found for this basename: PHART, PCO2, PO2, HCO3,  in the last 72 hours  Studies/Results: No results found.  Anti-infectives: Anti-infectives   Start     Dose/Rate Route Frequency Ordered Stop   03/24/13 0800  ciprofloxacin (CIPRO) IVPB 400 mg  Status:  Discontinued     400 mg 200 mL/hr over 60 Minutes Intravenous Every 24 hours 03/23/13 1241 04/02/13 0839   03/23/13 1700  metroNIDAZOLE (FLAGYL) IVPB 500 mg  Status:  Discontinued     500 mg 100 mL/hr over 60 Minutes Intravenous Every 8 hours 03/23/13 1236 04/02/13 0839   03/23/13 0730  [MAR Hold]  ciprofloxacin (CIPRO) IVPB 400 mg     (On MAR Hold since 03/23/13 0805)   400 mg 200  mL/hr over 60 Minutes Intravenous On call to O.R. 03/23/13 0728 03/23/13 0824   03/23/13 0730  [MAR Hold]  metroNIDAZOLE (FLAGYL) IVPB 500 mg     (On MAR Hold since 03/23/13 0805)   500 mg 100 mL/hr over 60 Minutes Intravenous On call to O.R. 03/23/13 2229 03/23/13 0910      Assessment/Plan: s/p Procedure(s): ESOPHAGOGASTRODUODENOSCOPY (EGD) (N/A) PERCUTANEOUS ENDOSCOPIC GASTROSTOMY (PEG) PLACEMENT (N/A)  SBO S/P SBR POD#18  -tolerating TF -swallow evaluation  Acute respiratory failure-s/p trach.  Still requiring vent support at night.  Wean per CCM.  Appreciate assistance  Sepsis  Anemia of critical illness-h&h 6.8-pt to get 1U PRBCs AKI-resolved S/P PEG, getting TFs at goal rate. CT suggests ileus and possible focal colitis.    ID - off ABX, WBC down today.  CT of abdomen noted post op changes, no abscess.  Suggests infectious or inflammatory colitis, c diff negative 2/9. Deconditioned - PT/OT, up to chair.  PCM- TF via PEG,  continue with Imodium and flexiseal.   DM-CBGs, lantus and novolog VTE - SCDs, lovenox  Dispo - ICU   LOS: 19 days    Rosario Jacks., Anne Hahn 04/10/2013

## 2013-04-10 NOTE — Progress Notes (Addendum)
eLink Physician-Brief Progress Note Patient Name: Kristi Alexander DOB: 20-Oct-1925 MRN: [id number]  Date of Service  04/10/2013   HPI/Events of Note     eICU Interventions  Hgb 6.8 - PRBCs ordered   Intervention Category Major Interventions: Other:  Merton Border 04/10/2013, 4:48 AM

## 2013-04-11 LAB — BASIC METABOLIC PANEL
BUN: 15 mg/dL (ref 6–23)
CALCIUM: 8 mg/dL — AB (ref 8.4–10.5)
CO2: 27 mEq/L (ref 19–32)
Chloride: 107 mEq/L (ref 96–112)
Creatinine, Ser: 0.73 mg/dL (ref 0.50–1.10)
GFR calc Af Amer: 87 mL/min — ABNORMAL LOW (ref >90)
GFR, EST NON AFRICAN AMERICAN: 75 mL/min — AB (ref >90)
GLUCOSE: 126 mg/dL — AB (ref 70–99)
POTASSIUM: 3.6 meq/L — AB (ref 3.7–5.3)
SODIUM: 142 meq/L (ref 137–147)

## 2013-04-11 LAB — GLUCOSE, CAPILLARY
GLUCOSE-CAPILLARY: 104 mg/dL — AB (ref 70–99)
GLUCOSE-CAPILLARY: 113 mg/dL — AB (ref 70–99)
GLUCOSE-CAPILLARY: 65 mg/dL — AB (ref 70–99)
Glucose-Capillary: 124 mg/dL — ABNORMAL HIGH (ref 70–99)
Glucose-Capillary: 142 mg/dL — ABNORMAL HIGH (ref 70–99)
Glucose-Capillary: 98 mg/dL (ref 70–99)

## 2013-04-11 LAB — CBC
HCT: 22.9 % — ABNORMAL LOW (ref 36.0–46.0)
HEMOGLOBIN: 7.5 g/dL — AB (ref 12.0–15.0)
MCH: 26.6 pg (ref 26.0–34.0)
MCHC: 32.8 g/dL (ref 30.0–36.0)
MCV: 81.2 fL (ref 78.0–100.0)
PLATELETS: 289 10*3/uL (ref 150–400)
RBC: 2.82 MIL/uL — AB (ref 3.87–5.11)
RDW: 21.2 % — ABNORMAL HIGH (ref 11.5–15.5)
WBC: 10.7 10*3/uL — AB (ref 4.0–10.5)

## 2013-04-11 LAB — TYPE AND SCREEN
ABO/RH(D): O POS
ANTIBODY SCREEN: NEGATIVE
UNIT DIVISION: 0

## 2013-04-11 LAB — PREALBUMIN: PREALBUMIN: 12.5 mg/dL — AB (ref 17.0–34.0)

## 2013-04-11 MED ORDER — MORPHINE SULFATE 2 MG/ML IJ SOLN
1.0000 mg | INTRAMUSCULAR | Status: DC | PRN
  Administered 2013-04-13 – 2013-04-14 (×3): 2 mg via INTRAVENOUS
  Filled 2013-04-11 (×3): qty 1

## 2013-04-11 MED ORDER — INSULIN ASPART 100 UNIT/ML ~~LOC~~ SOLN
0.0000 [IU] | SUBCUTANEOUS | Status: DC
  Administered 2013-04-11 – 2013-04-14 (×5): 2 [IU] via SUBCUTANEOUS

## 2013-04-11 MED ORDER — LOPERAMIDE HCL 1 MG/5ML PO LIQD
2.0000 mg | ORAL | Status: DC | PRN
  Filled 2013-04-11: qty 10

## 2013-04-11 MED ORDER — FREE WATER
200.0000 mL | Freq: Three times a day (TID) | Status: DC
  Administered 2013-04-11 – 2013-04-12 (×5): 200 mL

## 2013-04-11 MED ORDER — HYDRALAZINE HCL 20 MG/ML IJ SOLN
10.0000 mg | Freq: Four times a day (QID) | INTRAMUSCULAR | Status: DC | PRN
  Administered 2013-04-12 – 2013-04-13 (×3): 20 mg via INTRAVENOUS
  Administered 2013-04-15 (×2): 10 mg via INTRAVENOUS
  Administered 2013-04-16 – 2013-04-18 (×2): 20 mg via INTRAVENOUS
  Filled 2013-04-11 (×7): qty 1

## 2013-04-11 NOTE — Progress Notes (Signed)
SLP Cancellation Note  Patient Details Name: AEVAH STANSBERY MRN: [id number] DOB: 24-Nov-1925   Cancelled treatment:       Reason Eval/Treat Not Completed: Medical issues which prohibited therapy. Per RN, pt just transferred OOB to chair, with increase in SOB. RN requests that SLP return this afternoon to assess swallowing function and PMSV tolerance. Will return this afternoon as able.    Germain Osgood, M.A. CCC-SLP [phone]  Germain Osgood 04/11/2013, 11:54 AM

## 2013-04-11 NOTE — Progress Notes (Signed)
Pt seen and examined TC as tolerated. Rest on vent at night.  Tolerating TF Still very HTN at times  Alert,nad Soft, nt,nd; peg ok  -speech eval -will discuss BP issue with CCM, ?low dose HCTZ -o/w plan per NP note  Leighton Ruff. Redmond Pulling, MD, FACS General, Bariatric, & Minimally Invasive Surgery [facility], [place]

## 2013-04-11 NOTE — Progress Notes (Signed)
Patient ID: Kristi Alexander, female   DOB: 19-Aug-1925, 80 y.o.   MRN: [id number]  Subjective: Less secretions.  Strong cough.  H&h improves  With blood.    Objective:  Vital signs:  Filed Vitals:   04/11/13 0600 04/11/13 0719 04/11/13 0848 04/11/13 0853  BP: 126/108  159/54   Pulse: 86  60   Temp:  99 F (37.2 C)    TempSrc:  Oral    Resp: 32  24   Height:      Weight: 76.3 kg (168 lb 3.4 oz)     SpO2: 96%  95% 98%    Last BM Date: 04/10/13  Intake/Output   Yesterday:  02/15 0701 - 02/16 0700 In: 2930.7 [I.V.:335.7; Blood:125; NG/GT:2350] Out: 1911 [VPXTG:6269; Stool:1] This shift:  Total I/O In: -  Out: 300 [Urine:300]  Physical Exam:  General: Pt awake/alert  Cards: s1s2 rrr. No murmurs, gallops or rubs. Trace edema.  Lungs: course throughout  Abdomen: Soft. Nondistended. Nontender.  Inferior aspect of wound open, apply 2x2 to area, very superficial without evidence of site infection of abscess. No evidence of peritonitis. No incarcerated hernias.    Problem List:   Active Problems:   SBO (small bowel obstruction)   Acute respiratory failure   Sepsis   Acute renal failure    Results:   Labs: Results for orders placed during the hospital encounter of 03/22/13 (from the past 48 hour(s))  GLUCOSE, CAPILLARY     Status: Abnormal   Collection Time    04/09/13 11:54 AM      Result Value Ref Range   Glucose-Capillary 121 (*) 70 - 99 mg/dL  URINALYSIS, ROUTINE W REFLEX MICROSCOPIC     Status: Abnormal   Collection Time    04/09/13  2:31 PM      Result Value Ref Range   Color, Urine YELLOW  YELLOW   APPearance CLEAR  CLEAR   Specific Gravity, Urine 1.016  1.005 - 1.030   pH 6.5  5.0 - 8.0   Glucose, UA NEGATIVE  NEGATIVE mg/dL   Hgb urine dipstick NEGATIVE  NEGATIVE   Bilirubin Urine NEGATIVE  NEGATIVE   Ketones, ur NEGATIVE  NEGATIVE mg/dL   Protein, ur NEGATIVE  NEGATIVE mg/dL   Urobilinogen, UA 2.0 (*) 0.0 - 1.0 mg/dL   Nitrite NEGATIVE  NEGATIVE    Leukocytes, UA NEGATIVE  NEGATIVE   Comment: MICROSCOPIC NOT DONE ON URINES WITH NEGATIVE PROTEIN, BLOOD, LEUKOCYTES, NITRITE, OR GLUCOSE <1000 mg/dL.  GLUCOSE, CAPILLARY     Status: None   Collection Time    04/09/13  3:15 PM      Result Value Ref Range   Glucose-Capillary 76  70 - 99 mg/dL  GLUCOSE, CAPILLARY     Status: None   Collection Time    04/09/13  7:27 PM      Result Value Ref Range   Glucose-Capillary 97  70 - 99 mg/dL   Comment 1 Documented in Chart     Comment 2 Notify RN    GLUCOSE, CAPILLARY     Status: None   Collection Time    04/09/13 11:56 PM      Result Value Ref Range   Glucose-Capillary 98  70 - 99 mg/dL   Comment 1 Notify RN     Comment 2 Documented in Chart    GLUCOSE, CAPILLARY     Status: Abnormal   Collection Time    04/10/13  4:00 AM      Result  Value Ref Range   Glucose-Capillary 102 (*) 70 - 99 mg/dL   Comment 1 Documented in Chart     Comment 2 Notify RN    CBC     Status: Abnormal   Collection Time    04/10/13  4:15 AM      Result Value Ref Range   WBC 12.2 (*) 4.0 - 10.5 K/uL   RBC 2.63 (*) 3.87 - 5.11 MIL/uL   Hemoglobin 6.8 (*) 12.0 - 15.0 g/dL   Comment: REPEATED TO VERIFY     CRITICAL RESULT CALLED TO, READ BACK BY AND VERIFIED WITH:     LIZ MCATEE,RN AT 9678 04/10/13 BY ZBEECH.   HCT 21.0 (*) 36.0 - 46.0 %   MCV 79.8  78.0 - 100.0 fL   MCH 25.9 (*) 26.0 - 34.0 pg   MCHC 32.4  30.0 - 36.0 g/dL   RDW 22.4 (*) 11.5 - 15.5 %   Platelets 301  150 - 400 K/uL  BASIC METABOLIC PANEL     Status: Abnormal   Collection Time    04/10/13  4:15 AM      Result Value Ref Range   Sodium 144  137 - 147 mEq/L   Potassium 3.8  3.7 - 5.3 mEq/L   Chloride 108  96 - 112 mEq/L   CO2 27  19 - 32 mEq/L   Glucose, Bld 105 (*) 70 - 99 mg/dL   BUN 14  6 - 23 mg/dL   Creatinine, Ser 0.76  0.50 - 1.10 mg/dL   Calcium 7.9 (*) 8.4 - 10.5 mg/dL   GFR calc non Af Amer 74 (*) >90 mL/min   GFR calc Af Amer 86 (*) >90 mL/min   Comment: (NOTE)     The eGFR  has been calculated using the CKD EPI equation.     This calculation has not been validated in all clinical situations.     eGFR's persistently <90 mL/min signify possible Chronic Kidney     Disease.  PREPARE RBC (CROSSMATCH)     Status: None   Collection Time    04/10/13  5:30 AM      Result Value Ref Range   Order Confirmation ORDER PROCESSED BY BLOOD BANK    TYPE AND SCREEN     Status: None   Collection Time    04/10/13  5:30 AM      Result Value Ref Range   ABO/RH(D) O POS     Antibody Screen NEG     Sample Expiration 04/13/2013     Unit Number [id number]     Blood Component Type RED CELLS,LR     Unit division 00     Status of Unit ISSUED,FINAL     Transfusion Status OK TO TRANSFUSE     Crossmatch Result Compatible    GLUCOSE, CAPILLARY     Status: Abnormal   Collection Time    04/10/13  7:22 AM      Result Value Ref Range   Glucose-Capillary 119 (*) 70 - 99 mg/dL  GLUCOSE, CAPILLARY     Status: Abnormal   Collection Time    04/10/13 11:18 AM      Result Value Ref Range   Glucose-Capillary 112 (*) 70 - 99 mg/dL  GLUCOSE, CAPILLARY     Status: Abnormal   Collection Time    04/10/13  4:16 PM      Result Value Ref Range   Glucose-Capillary 108 (*) 70 - 99 mg/dL  GLUCOSE, CAPILLARY  Status: None   Collection Time    04/10/13  8:02 PM      Result Value Ref Range   Glucose-Capillary 94  70 - 99 mg/dL  GLUCOSE, CAPILLARY     Status: Abnormal   Collection Time    04/10/13 11:34 PM      Result Value Ref Range   Glucose-Capillary 113 (*) 70 - 99 mg/dL  GLUCOSE, CAPILLARY     Status: Abnormal   Collection Time    04/11/13  3:36 AM      Result Value Ref Range   Glucose-Capillary 113 (*) 70 - 99 mg/dL  BASIC METABOLIC PANEL     Status: Abnormal   Collection Time    04/11/13  4:00 AM      Result Value Ref Range   Sodium 142  137 - 147 mEq/L   Potassium 3.6 (*) 3.7 - 5.3 mEq/L   Chloride 107  96 - 112 mEq/L   CO2 27  19 - 32 mEq/L   Glucose, Bld 126 (*) 70 - 99  mg/dL   BUN 15  6 - 23 mg/dL   Creatinine, Ser 0.73  0.50 - 1.10 mg/dL   Calcium 8.0 (*) 8.4 - 10.5 mg/dL   GFR calc non Af Amer 75 (*) >90 mL/min   GFR calc Af Amer 87 (*) >90 mL/min   Comment: (NOTE)     The eGFR has been calculated using the CKD EPI equation.     This calculation has not been validated in all clinical situations.     eGFR's persistently <90 mL/min signify possible Chronic Kidney     Disease.  CBC     Status: Abnormal   Collection Time    04/11/13  4:00 AM      Result Value Ref Range   WBC 10.7 (*) 4.0 - 10.5 K/uL   RBC 2.82 (*) 3.87 - 5.11 MIL/uL   Hemoglobin 7.5 (*) 12.0 - 15.0 g/dL   HCT 22.9 (*) 36.0 - 46.0 %   MCV 81.2  78.0 - 100.0 fL   MCH 26.6  26.0 - 34.0 pg   MCHC 32.8  30.0 - 36.0 g/dL   RDW 21.2 (*) 11.5 - 15.5 %   Platelets 289  150 - 400 K/uL  GLUCOSE, CAPILLARY     Status: Abnormal   Collection Time    04/11/13  7:27 AM      Result Value Ref Range   Glucose-Capillary 124 (*) 70 - 99 mg/dL   Comment 1 Notify RN      Imaging / Studies: No results found.  Medications / Allergies: per chart  Antibiotics: Anti-infectives   Start     Dose/Rate Route Frequency Ordered Stop   03/24/13 0800  ciprofloxacin (CIPRO) IVPB 400 mg  Status:  Discontinued     400 mg 200 mL/hr over 60 Minutes Intravenous Every 24 hours 03/23/13 1241 04/02/13 0839   03/23/13 1700  metroNIDAZOLE (FLAGYL) IVPB 500 mg  Status:  Discontinued     500 mg 100 mL/hr over 60 Minutes Intravenous Every 8 hours 03/23/13 1236 04/02/13 0839   03/23/13 0730  [MAR Hold]  ciprofloxacin (CIPRO) IVPB 400 mg     (On MAR Hold since 03/23/13 0805)   400 mg 200 mL/hr over 60 Minutes Intravenous On call to O.R. 03/23/13 0728 03/23/13 0824   03/23/13 0730  [MAR Hold]  metroNIDAZOLE (FLAGYL) IVPB 500 mg     (On MAR Hold since 03/23/13 0805)   500 mg  100 mL/hr over 60 Minutes Intravenous On call to O.R. 03/23/13 7672 03/23/13 0910     Assessment/Plan  SBO S/P SBR POD#19  -tolerating TF   -?swallow evaluation  -bid wet to dry dressing changes Acute respiratory failure-s/p trach. Still requiring vent support at night. Wean per CCM. Appreciate assistance  Sepsis 2/2 peritonitis   Anemia of critical illness-h&h down yesterday received 1 unit PRBCs. Repeat CBC in AM AKI-resolved  S/P PEG, getting TFs at goal rate. Diarrhea resolved, change Imodium to PRN ID - off ABX, WBC down to 10.7k. CT of abdomen noted post op changes, no abscess. Suggests infectious or inflammatory colitis, c diff negative 2/9.  Deconditioned - PT/OT, up to chair. Continue foley until this improves some, she was requiring vent support more when foley was taken out on Saturday.  Can try to remove later this week.   PCM- TF via PEG, hyponatremia improved.   DM-CBGs, lantus and novolog  VTE - SCDs, lovenox  Dispo - ICU.  LTACH when medically stable  Erby Pian, [facility] Pager [phone] Office [phone]  04/11/2013  9:04 AM'

## 2013-04-11 NOTE — Progress Notes (Signed)
This CSW taking the case to assist unit CSW, as unit CSW uncomfortable with vent SNF search. CSW has sent referral to [facility], [facility] in [place], and [facility] in [place].   Ky Barban, MSW, [facility] Clinical Social Worker [phone]

## 2013-04-11 NOTE — Evaluation (Signed)
Clinical/Bedside Swallow Evaluation Patient Details  Name: KAELEY VINJE MRN: [id number] Date of Birth: 10/17/1925  Today's Date: 04/11/2013 Time: 7591-6384 SLP Time Calculation (min): 12 min  Past Medical History:  Past Medical History  Diagnosis Date  . Restless leg syndrome   . Hypertension   . Dyslipidemia   . History of appendectomy   . H/O: hysterectomy   . Cyst of breast, right, benign solitary   . Inguinal hernia   . History of lumbosacral spine surgery    Past Surgical History:  Past Surgical History  Procedure Laterality Date  . Appendectomy    . Abdominal hysterectomy    . Laparotomy N/A 03/23/2013    Procedure: EXPLORATORY LAPAROTOMY;  Surgeon: Gwenyth Ober, MD;  Location: [place];  Service: General;  Laterality: N/A;  . Bowel resection N/A 03/23/2013    Procedure: SMALL BOWEL RESECTION;  Surgeon: Gwenyth Ober, MD;  Location: [place];  Service: General;  Laterality: N/A;  . Tracheostomy      feinstein  . Esophagogastroduodenoscopy N/A 04/01/2013    Procedure: ESOPHAGOGASTRODUODENOSCOPY (EGD);  Surgeon: Gwenyth Ober, MD;  Location: [place];  Service: General;  Laterality: N/A;  . Peg placement N/A 04/01/2013    Procedure: PERCUTANEOUS ENDOSCOPIC GASTROSTOMY (PEG) PLACEMENT;  Surgeon: Gwenyth Ober, MD;  Location: [facility] ENDOSCOPY;  Service: General;  Laterality: N/A;   HPI:  80 yo female adm to [facility] 03/22/13 with SBO, pt is s/p surgery, trach #6 Shiley cuffed 04/01/13 and PEG placement.  She had VDRF and had respiratory arrest last Sunday due to mucus plug per SLP conversation with RN.   Assessment / Plan / Recommendation Clinical Impression  Pt consumed trials of ice chips after thorough oral care, with cuff deflated and PMSV in place. Trials resulted in mild increase in RR and WOB, which fluctuated throughout PMSV trial. Given pt's risk factors for aspiration including decreased respiratory status, recommend objective testing prior to initiation of PO diet. SLP will  continue to follow to assess readiness for testing as pt's tolerance of PMSV continues to improve.    Aspiration Risk  Moderate (moderate-severe)    Diet Recommendation NPO;Alternative means - temporary (pt already with PEG )   Medication Administration: Via alternative means    Other  Recommendations Recommended Consults: Other (Comment) (will require objective testing prior to initiating PO diet) Oral Care Recommendations: Oral care Q4 per protocol   Follow Up Recommendations  Skilled Nursing facility    Frequency and Duration min 2x/week  2 weeks   Pertinent Vitals/Pain N/A    SLP Swallow Goals     Swallow Study Prior Functional Status       General Date of Onset: 03/22/13 HPI: 80 yo female adm to [facility] 03/22/13 with SBO, pt is s/p surgery, trach #6 Shiley cuffed 04/01/13 and PEG placement.  She had VDRF and had respiratory arrest last Sunday due to mucus plug per SLP conversation with RN. Type of Study: Bedside swallow evaluation Diet Prior to this Study: NPO;PEG tube Temperature Spikes Noted: Yes (low grade) Respiratory Status: Trach collar (40% FiO2) Trach Size and Type: Cuff;#6;Deflated;With PMSV in place History of Recent Intubation: Yes Length of Intubations (days): 2 days (3 recent intubations: 2 days, 5 day, and then 2 more days) Date extubated: 04/01/13 (pt received trach) Behavior/Cognition: Alert;Pleasant mood;Cooperative Self-Feeding Abilities: Able to feed self Patient Positioning: Upright in chair Baseline Vocal Quality: Hoarse (mildly hoarse, speech via PMSV) Volitional Cough: Strong    Oral/Motor/Sensory Function Overall Oral Motor/Sensory  Function: Appears within functional limits for tasks assessed   Ice Chips Ice chips: Impaired Presentation: Spoon;Self Fed Pharyngeal Phase Impairments: Change in Vital Signs (mild increase in RR, WOB )   Thin Liquid Thin Liquid: Not tested    Nectar Thick Nectar Thick Liquid: Not tested   Honey Thick Honey Thick  Liquid: Not tested   Puree Puree: Not tested   Solid   GO    Solid: Not tested        Germain Osgood, M.A. CCC-SLP [phone]  Germain Osgood 04/11/2013,3:35 PM

## 2013-04-11 NOTE — Progress Notes (Signed)
UR Completed.  Vergie Living [phone] 04/11/2013

## 2013-04-11 NOTE — Progress Notes (Signed)
Name: Kristi Alexander MRN: [id number] DOB: [birth year]    ADMISSION DATE:  03/22/2013 CONSULTATION DATE:  1/28  REFERRING MD :  Dr Hulen Skains PRIMARY SERVICE: CCS  CHIEF COMPLAINT:  Vent and medical management   BRIEF PATIENT DESCRIPTION: 80 yo female with hx HTN admitted 1/27 with SBO.  Initially managed conservatively but cont to have worsening abd pain and increased WBC and was taken to OR 1/28 for exp lap and bowel resection.  Remained on vent post op and PCCM consulted.   SIGNIFICANT EVENTS / STUDIES:  1/27 admitted with SBO, conservative management  1/28 necrotic bowel suspected, to OR, 3-4 feet frankly necrotic bowel removed (no perforation)  2/01 Re-intubated; CT abd >> possible adhesions causing obstruction of SB, colon thickening ?infection or ischemia 2/08 On vent overnight ? Mucus plug 2/09 Off vent 2/10 on vent overnight >> mucus plug, transfuse 1 unit PRBC 2/11 on vent overnight for being worn out 2/12 unable to sleep off vent in early hours of AM, working too hard, placed back on vent 2/12 CT abd/pelvis >> SB ileus, ? Colitis transverse colon  LINES / TUBES: ETT 1/28>>>1/29; 2/1>>> 2/5, 2/5 >>2/6 Trach 2/6 >> L brachial aline 1/28>>> out Foley 1/28 >>> Rt PICC 2/1 >> Gastrostomy 2/6 >>>  CULTURES: Urine 1/28>>> negative C.Diff >>> Neg   ANTIBIOTICS: Flagyl 1/28>>>2/7 Cipro 1/29 >> 2/7  SUBJECTIVE:  Needed vent overnight.   TC today , increased WOB this evening , placed back on vent  Increased urinary incontinence , foley placed   VITAL SIGNS: Temp:  [98 F (36.7 C)-99.9 F (37.7 C)] 98 F (36.7 C) (02/16 1519) Pulse Rate:  [53-106] 101 (02/16 1800) Resp:  [16-47] 35 (02/16 1800) BP: (119-208)/(51-108) 169/65 mmHg (02/16 1800) SpO2:  [85 %-98 %] 94 % (02/16 1800) FiO2 (%):  [28 %-40 %] 30 % (02/16 1748) Weight:  [76.3 kg (168 lb 3.4 oz)] 76.3 kg (168 lb 3.4 oz) (02/16 0600)   INTAKE / OUTPUT: Intake/Output     02/16 0701 - 02/17 0700   I.V.  (mL/kg) 240 (3.1)   Blood    Other    NG/GT 550   Total Intake(mL/kg) 790 (10.4)   Urine (mL/kg/hr) 1030 (1.1)   Stool    Total Output 1030   Net -240         PHYSICAL EXAMINATION: General: resting on vent  Neuro:  Awake, follows commands  HEENT: trach site C/D/I. Cardiovascular: RRR, no M/R/G. Lungs: scattered rhonchi, no wheeze.  Resp's even and unlabored. Abdomen: non tender Musculoskeletal: no edema  LABS:  CBC  Recent Labs Lab 04/09/13 0400 04/10/13 0415 04/11/13 0400  WBC 12.7* 12.2* 10.7*  HGB 8.8* 6.8* 7.5*  HCT 27.6* 21.0* 22.9*  PLT 281 301 289   BMET  Recent Labs Lab 04/09/13 0400 04/10/13 0415 04/11/13 0400  NA 141 144 142  K 3.3* 3.8 3.6*  CL 107 108 107  CO2 25 27 27   BUN 14 14 15   CREATININE 0.68 0.76 0.73  GLUCOSE 122* 105* 126*   Electrolytes  Recent Labs Lab 04/09/13 0400 04/10/13 0415 04/11/13 0400  CALCIUM 7.9* 7.9* 8.0*    Glucose  Recent Labs Lab 04/10/13 2002 04/10/13 2334 04/11/13 0336 04/11/13 0727 04/11/13 1130 04/11/13 1517  GLUCAP 94 113* 113* 124* 104* 142*    Imaging No results found.  ASSESSMENT / PLAN:  PULMONARY A: Acute respiratory failure in setting of ischemic bowel and sepsis - Re-intubated 2/1 & again 2/5 for stridor ultimately  required trach >> completed course of steroids for stridor. Small Pleural effusions >> unchanged. Intermittent mucus plugging. P:   -ATC as tolerated >> still needing vent support at night. Wean as able -PM valve as tolerated -bronchial hygiene -f/u CXR intermittently -BID xopenex  CARDIOVASCULAR A: Severe sepsis >> resolved. Hx of HTN. P:  -changed to lopressor per tube 2/10  RENAL A: Acute kidney injury (baseline creatinine 0.9 from 03/22/13) >> resolved. Non-anion gap metabolic acidosis, lactic acidosis >> resolved. Hypervolemia >> improved. Hypernatremia >> improved from 2/11. Hypokalemia. P:   - d/c D5W -continue free water tube flushes -f/u  renal fx, urine outpt, electrolytes  GASTROINTESTINAL A: SBO from ischemic bowel. Protein calorie malnutrition. Diarrhea (short gut ). Colitis, SB ileus on CT abd 2/12. P:   -post-op care per CCS -continue tube feeds per CCS -?if she could have swallow evaluation at some point in near future -protonix for SUP -flexiseal > imodium per CCS  (c dif negative 2/9).  HEMATOLOGIC A: Anemia of critical illness >> no obvious sites for bleeding. P:  -trend CBC >> transfuse for Hb < 7 -lovenox for DVT prevention  INFECTIOUS A: Severe sepsis 2nd to ischemic bowel >> resolved. Leukocytosis - improved P:   -monitor WBC/fever curve.  ENDOCRINE A: Hyperglycemia. 2/14 low BS  P:   -SSI  -decrease Lantus 15 u   NEUROLOGIC A: Post op pain. Deconditioning P:   -PRN fentanyl. -PT/OT as able. -will likely need LTAC placement  Goals of care >> limited code >> no CPR, no defibrillation.    Merton Border, MD Pulmonary and [place] Pager: [phone]  04/11/2013, 7:25 PM

## 2013-04-11 NOTE — Progress Notes (Addendum)
Speech Language Pathology Treatment: Nada Boozer Speaking valve  Patient Details Name: Kristi Alexander MRN: [id number] DOB: 09/03/1925 Today's Date: 04/11/2013 Time: 7867-6720 SLP Time Calculation (min): 18 min  Assessment / Plan / Recommendation Clinical Impression  Pt seen for PMSV tolerance. Cuff was deflated without incidence prior to placement of PMSV. Pt was able to achieve adequate phonation for intelligible speech at the conversational level, although with moderately reduced breath support and mild hoarseness noted. Pt's respiratory rate was elevated at ~30 at baseline, and fluctuated as high as into the 40s during trial. Pt required Mod cues for slow, controlled breaths to bring RR back down. Pt presented with increased WOB, with valve being removed briefly x2 throughout trial. No CO2 retention was noted upon valve removal. SpO2 dropped as low as 84% with valve removed, however pt was able to quickly recover with cues for deep breaths. Recommend to continue to use valve intermittently with full supervision from staff. As tolerance continues to improve, pt will be more appropriate for objective swallowing test. Continue plan of care.   HPI HPI: 80 yo female adm to [facility] 03/22/13 with SBO, pt is s/p surgery, trach #6 Shiley cuffed 04/01/13 and PEG placement.  She had VDRF and had respiratory arrest last Sunday due to mucus plug per SLP conversation with RN.   Pertinent Vitals N/A  SLP Plan  Continue with current plan of care    Recommendations Medication Administration: Via alternative means      Patient may use Passy-Muir Speech Valve: Intermittently with supervision PMSV Supervision: Full MD: Please consider changing trach tube to : Smaller size;Cuffless       Oral Care Recommendations: Oral care Q4 per protocol Follow up Recommendations: Skilled Nursing facility Plan: Continue with current plan of care    GO      Germain Osgood, M.A. CCC-SLP [phone]  Germain Osgood 04/11/2013, 3:38 PM

## 2013-04-11 NOTE — Progress Notes (Signed)
Physical Therapy Treatment Patient Details Name: Kristi Alexander MRN: [id number] DOB: [birth year] Today's Date: 04/11/2013 Time: 3244-0102 PT Time Calculation (min): 21 min  PT Assessment / Plan / Recommendation  History of Present Illness Pt s/p exp. lap and resection of 4 foot section of necrotic bowel.  pt back to room intubated.  Extubated 1/29.  Trached 2/6 due to pt having trouble protecting airway.   PT Comments   Pt quite fatigued after sitting up a large portion of the day.  RR is high up in the 30's and 40's,  HR in the 118-122 bpm range.  Sats 99/100%.  Pt is good spirits.    Follow Up Recommendations  SNF     Does the patient have the potential to tolerate intense rehabilitation     Barriers to Discharge        Equipment Recommendations  Other (comment) (TBA)    Recommendations for Other Services    Frequency Min 3X/week   Progress towards PT Goals Progress towards PT goals: Progressing toward goals  Plan Current plan remains appropriate    Precautions / Restrictions     Pertinent Vitals/Pain See above    Mobility  Bed Mobility Overal bed mobility: Needs Assistance Bed Mobility: Sit to Supine Supine to sit: Mod assist General bed mobility comments: Bridged well to EOB and needed LE assist Transfers Overall transfer level: Needs assistance Transfers: Sit to/from Stand Sit to Stand: Min assist Stand pivot transfers: Min assist;+2 safety/equipment General transfer comment: cues for hand placement and safety; assist to come forward and stand Ambulation/Gait Ambulation/Gait assistance: Min assist;+2 safety/equipment Ambulation Distance (Feet): 5 Feet (forward and back to bed) Assistive device: Rolling walker (2 wheeled) Gait Pattern/deviations: Step-through pattern;Decreased step length - right;Decreased step length - left;Decreased stride length Gait velocity: slow General Gait Details: Gait limited by pt's fatigue and respiratory rate    Exercises      PT Diagnosis:    PT Problem List:   PT Treatment Interventions:     PT Goals (current goals can now be found in the care plan section) Acute Rehab PT Goals Patient Stated Goal: 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 lap and resection of 4 foot section of necrotic bowel.  pt back to room intubated.  Extubated 1/29.  Trached 2/6 due to pt having trouble protecting airway.    Subjective Data  Subjective: I enjoyed it. Patient Stated Goal: 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   Cognition  Cognition Arousal/Alertness: Awake/alert Behavior During Therapy: WFL for tasks assessed/performed Overall Cognitive Status: Within Functional Limits for tasks assessed    Balance  Balance Sitting-balance support: No upper extremity supported;Feet supported Sitting balance-Leahy Scale: Good Standing balance support: Bilateral upper extremity supported Standing balance-Leahy Scale: Fair  End of Session PT - End of Session Equipment Utilized During Treatment: Oxygen;Other (comment) (trach collar) Activity Tolerance: Patient limited by fatigue Patient left: in chair;with call bell/phone within reach Nurse Communication: Mobility status   GP     Mohamud Mrozek, [name] 04/11/2013, 5:11 PM 04/11/2013  Donnella Sham, [place] [phone]  (pager)

## 2013-04-12 ENCOUNTER — Inpatient Hospital Stay (HOSPITAL_COMMUNITY): Payer: Medicare Other

## 2013-04-12 LAB — GLUCOSE, CAPILLARY
GLUCOSE-CAPILLARY: 126 mg/dL — AB (ref 70–99)
GLUCOSE-CAPILLARY: 96 mg/dL (ref 70–99)
Glucose-Capillary: 110 mg/dL — ABNORMAL HIGH (ref 70–99)
Glucose-Capillary: 100 mg/dL — ABNORMAL HIGH (ref 70–99)
Glucose-Capillary: 121 mg/dL — ABNORMAL HIGH (ref 70–99)
Glucose-Capillary: 103 mg/dL — ABNORMAL HIGH (ref 70–99)

## 2013-04-12 LAB — CBC
HCT: 23.7 % — ABNORMAL LOW (ref 36.0–46.0)
HEMOGLOBIN: 7.6 g/dL — AB (ref 12.0–15.0)
MCH: 26.3 pg (ref 26.0–34.0)
MCHC: 32.1 g/dL (ref 30.0–36.0)
MCV: 82 fL (ref 78.0–100.0)
Platelets: 298 10*3/uL (ref 150–400)
RBC: 2.89 MIL/uL — ABNORMAL LOW (ref 3.87–5.11)
RDW: 21.8 % — ABNORMAL HIGH (ref 11.5–15.5)
WBC: 11.9 10*3/uL — ABNORMAL HIGH (ref 4.0–10.5)

## 2013-04-12 LAB — BASIC METABOLIC PANEL
BUN: 15 mg/dL (ref 6–23)
CALCIUM: 8.6 mg/dL (ref 8.4–10.5)
CO2: 28 meq/L (ref 19–32)
Chloride: 107 mEq/L (ref 96–112)
Creatinine, Ser: 0.72 mg/dL (ref 0.50–1.10)
GFR calc Af Amer: 88 mL/min — ABNORMAL LOW (ref >90)
GFR, EST NON AFRICAN AMERICAN: 76 mL/min — AB (ref >90)
Glucose, Bld: 113 mg/dL — ABNORMAL HIGH (ref 70–99)
Potassium: 3.8 mEq/L (ref 3.7–5.3)
SODIUM: 145 meq/L (ref 137–147)

## 2013-04-12 MED ORDER — OXYCODONE HCL 5 MG/5ML PO SOLN: 5.0000 mg | ORAL | Status: DC | PRN

## 2013-04-12 MED ORDER — INSULIN GLARGINE 100 UNIT/ML ~~LOC~~ SOLN
10.0000 [IU] | Freq: Every day | SUBCUTANEOUS | Status: DC
  Administered 2013-04-13: 10 [IU] via SUBCUTANEOUS
  Filled 2013-04-12: qty 0.1

## 2013-04-12 MED ORDER — METOPROLOL TARTRATE 25 MG/10 ML ORAL SUSPENSION
12.5000 mg | Freq: Three times a day (TID) | ORAL | Status: DC
  Administered 2013-04-13 (×2): 12.5 mg
  Filled 2013-04-12 (×8): qty 5

## 2013-04-12 NOTE — Progress Notes (Signed)
NUTRITION FOLLOW UP  Intervention:    Continue current TF regimen RD to follow for nutrition care plan  Nutrition Dx:   Inadequate oral intake related to inability to eat as evidenced by NPO status, ongoing  Goal:   Pt to meet >/= 90% of their estimated nutrition needs, met  Monitor:   TF regimen & tolerance, PO diet advancement, respiratory status, weight, labs, I/O's  Assessment:   80 year old female admitted with SBO s/p ex lap and bowel resection on 1/28. She is to on TPN for prolonged ileus. Intubated 2/1 for acute metabolic acidosis. CT 2/1: expected post po changes. Failed extubated on 2/5.   Patient s/p procedures 2/6: PERCUTANEOUS ENDOSCOPIC GASTROSTOMY TUBE  Patient is currently intubated on ventilator support -- trach MV: 11.2 L/min Temp (24hrs), Avg:99.2 F (37.3 C), Min:98 F (36.7 C), Max:100.2 F (37.9 C)   Vital AF 1.2 formula continues to infuse at 50 ml/hr via PEG tube providing 1440 kcal, 90 grams of protein and 973 ml of free water.    Free water flushes at 200 ml every 4 hours.  Per RN, diarrhea better.  Patient s/p bedside swallow evaluation 2/16.  SLP recommending objective test prior to PO diet initiation.   Disposition: LTACH when medically stable.  Height: Ht Readings from Last 1 Encounters:  03/23/13 5' 2"  (1.575 m)   Weight ----> stable Wt Readings from Last 1 Encounters:  04/12/13 172 lb 6.4 oz (78.2 kg)    2/16  168 lb 2/15  171 lb 2/14  171 lb 2/13  169 lb 2/12  160 lb 2/11  164 lb 2/10  163 lb 2/09  162 lb  Re-estimated needs:  Kcal: 1500-1650 Protein: 80-90 grams Fluid: >/= 1.5 L  Skin: abdominal incision, skin tear on lip  Diet Order: NPO   Intake/Output Summary (Last 24 hours) at 04/12/13 0930 Last data filed at 04/12/13 0800  Gross per 24 hour  Intake   1590 ml  Output   1520 ml  Net     70 ml    Labs:   Recent Labs Lab 04/10/13 0415 04/11/13 0400 04/12/13 0445  NA 144 142 145  K 3.8 3.6* 3.8  CL 108  107 107  CO2 27 27 28   BUN 14 15 15   CREATININE 0.76 0.73 0.72  CALCIUM 7.9* 8.0* 8.6  GLUCOSE 105* 126* 113*    CBG (last 3)   Recent Labs  04/11/13 2332 04/12/13 0432 04/12/13 0735  GLUCAP 110* 100* 121*    Scheduled Meds: . chlorhexidine  15 mL Mouth/Throat BID  . enoxaparin (LOVENOX) injection  30 mg Subcutaneous Daily  . free water  200 mL Per Tube 3 times per day  . insulin aspart  0-15 Units Subcutaneous 6 times per day  . insulin glargine  15 Units Subcutaneous Daily  . metoprolol tartrate  25 mg Per Tube BID  . pantoprazole sodium  40 mg Per Tube Daily  . sodium chloride  10-40 mL Intracatheter Q12H    Continuous Infusions: . sodium chloride 20 mL/hr at 04/11/13 1542  . feeding supplement (VITAL AF 1.2 CAL) 1,000 mL (04/11/13 1025)    Arthur Holms, RD, LDN Pager #: [phone] After-Hours Pager #: [phone]

## 2013-04-12 NOTE — Progress Notes (Signed)
No issues Failed swallow BP better.  Tolerating TF  Alert, nad Soft, nt, nd. g tube ok  Wean vent as tolerated Cont current care  Leighton Ruff. Redmond Pulling, MD, FACS General, Bariatric, & Minimally Invasive Surgery [facility], [place]

## 2013-04-12 NOTE — Progress Notes (Signed)
Pt seen for trach consult. Pt remains on full vent support. Trach team will continue to follow for pt progression.

## 2013-04-12 NOTE — Progress Notes (Signed)
Patient ID: Kristi Alexander, female   DOB: Apr 30, 1925, 80 y.o.   MRN: [id number]   Subjective: Still tachypnic, requiring ventilator at night.  Afebrile.  Diarrhea resolved.  Adequate intake and output.  Tachy brady yesterday low of 53.    Objective:  Vital signs:  Filed Vitals:   04/12/13 0700 04/12/13 0737 04/12/13 0800 04/12/13 0809  BP: 158/50  147/53 147/53  Pulse: 83  108 72  Temp:  99.2 F (37.3 C)    TempSrc:  Oral    Resp: 41  24 39  Height:      Weight:      SpO2: 96%  96% 95%    Last BM Date: 04/11/13  Intake/Output   Yesterday:  02/16 0701 - 02/17 0700 In: 1660 [I.V.:480; NG/GT:1150] Out: 1670 [Urine:1670] This shift:  Total I/O In: 70 [I.V.:20; NG/GT:50] Out: -   Physical Exam:  General: Pt awake/alert  Cards: s1s2 rrr. No murmurs, gallops or rubs. Trace edema.  Lungs: course throughout  Abdomen: Soft. Nondistended. Nontender. Inferior aspect of wound open, apply 2x2 to area, very superficial without evidence of site infection of abscess. No evidence of peritonitis. No incarcerated hernias.    Problem List:   Active Problems:   SBO (small bowel obstruction)   Acute respiratory failure   Sepsis   Acute renal failure    Results:   Labs: Results for orders placed during the hospital encounter of 03/22/13 (from the past 48 hour(s))  GLUCOSE, CAPILLARY     Status: Abnormal   Collection Time    04/10/13 11:18 AM      Result Value Ref Range   Glucose-Capillary 112 (*) 70 - 99 mg/dL  GLUCOSE, CAPILLARY     Status: Abnormal   Collection Time    04/10/13  4:16 PM      Result Value Ref Range   Glucose-Capillary 108 (*) 70 - 99 mg/dL  GLUCOSE, CAPILLARY     Status: None   Collection Time    04/10/13  8:02 PM      Result Value Ref Range   Glucose-Capillary 94  70 - 99 mg/dL  GLUCOSE, CAPILLARY     Status: Abnormal   Collection Time    04/10/13 11:34 PM      Result Value Ref Range   Glucose-Capillary 113 (*) 70 - 99 mg/dL  PREALBUMIN      Status: Abnormal   Collection Time    04/11/13  3:25 AM      Result Value Ref Range   Prealbumin 12.5 (*) 17.0 - 34.0 mg/dL   Comment: Performed at [place], CAPILLARY     Status: Abnormal   Collection Time    04/11/13  3:36 AM      Result Value Ref Range   Glucose-Capillary 113 (*) 70 - 99 mg/dL  BASIC METABOLIC PANEL     Status: Abnormal   Collection Time    04/11/13  4:00 AM      Result Value Ref Range   Sodium 142  137 - 147 mEq/L   Potassium 3.6 (*) 3.7 - 5.3 mEq/L   Chloride 107  96 - 112 mEq/L   CO2 27  19 - 32 mEq/L   Glucose, Bld 126 (*) 70 - 99 mg/dL   BUN 15  6 - 23 mg/dL   Creatinine, Ser 0.73  0.50 - 1.10 mg/dL   Calcium 8.0 (*) 8.4 - 10.5 mg/dL   GFR calc non Af Amer 75 (*) >90  mL/min   GFR calc Af Amer 87 (*) >90 mL/min   Comment: (NOTE)     The eGFR has been calculated using the CKD EPI equation.     This calculation has not been validated in all clinical situations.     eGFR's persistently <90 mL/min signify possible Chronic Kidney     Disease.  CBC     Status: Abnormal   Collection Time    04/11/13  4:00 AM      Result Value Ref Range   WBC 10.7 (*) 4.0 - 10.5 K/uL   RBC 2.82 (*) 3.87 - 5.11 MIL/uL   Hemoglobin 7.5 (*) 12.0 - 15.0 g/dL   HCT 22.9 (*) 36.0 - 46.0 %   MCV 81.2  78.0 - 100.0 fL   MCH 26.6  26.0 - 34.0 pg   MCHC 32.8  30.0 - 36.0 g/dL   RDW 21.2 (*) 11.5 - 15.5 %   Platelets 289  150 - 400 K/uL  GLUCOSE, CAPILLARY     Status: Abnormal   Collection Time    04/11/13  7:27 AM      Result Value Ref Range   Glucose-Capillary 124 (*) 70 - 99 mg/dL   Comment 1 Notify RN    GLUCOSE, CAPILLARY     Status: Abnormal   Collection Time    04/11/13 11:30 AM      Result Value Ref Range   Glucose-Capillary 104 (*) 70 - 99 mg/dL   Comment 1 Notify RN     Comment 2 Documented in Chart    GLUCOSE, CAPILLARY     Status: Abnormal   Collection Time    04/11/13  3:17 PM      Result Value Ref Range   Glucose-Capillary 142 (*) 70  - 99 mg/dL   Comment 1 Documented in Chart     Comment 2 Notify RN    GLUCOSE, CAPILLARY     Status: None   Collection Time    04/11/13  7:43 PM      Result Value Ref Range   Glucose-Capillary 98  70 - 99 mg/dL  GLUCOSE, CAPILLARY     Status: Abnormal   Collection Time    04/11/13 11:32 PM      Result Value Ref Range   Glucose-Capillary 110 (*) 70 - 99 mg/dL   Comment 1 Documented in Chart     Comment 2 Notify RN    GLUCOSE, CAPILLARY     Status: Abnormal   Collection Time    04/12/13  4:32 AM      Result Value Ref Range   Glucose-Capillary 100 (*) 70 - 99 mg/dL  CBC     Status: Abnormal   Collection Time    04/12/13  4:45 AM      Result Value Ref Range   WBC 11.9 (*) 4.0 - 10.5 K/uL   RBC 2.89 (*) 3.87 - 5.11 MIL/uL   Hemoglobin 7.6 (*) 12.0 - 15.0 g/dL   HCT 23.7 (*) 36.0 - 46.0 %   MCV 82.0  78.0 - 100.0 fL   MCH 26.3  26.0 - 34.0 pg   MCHC 32.1  30.0 - 36.0 g/dL   RDW 21.8 (*) 11.5 - 15.5 %   Platelets 298  150 - 400 K/uL  BASIC METABOLIC PANEL     Status: Abnormal   Collection Time    04/12/13  4:45 AM      Result Value Ref Range   Sodium 145  137 -  147 mEq/L   Potassium 3.8  3.7 - 5.3 mEq/L   Chloride 107  96 - 112 mEq/L   CO2 28  19 - 32 mEq/L   Glucose, Bld 113 (*) 70 - 99 mg/dL   BUN 15  6 - 23 mg/dL   Creatinine, Ser 0.72  0.50 - 1.10 mg/dL   Calcium 8.6  8.4 - 10.5 mg/dL   GFR calc non Af Amer 76 (*) >90 mL/min   GFR calc Af Amer 88 (*) >90 mL/min   Comment: (NOTE)     The eGFR has been calculated using the CKD EPI equation.     This calculation has not been validated in all clinical situations.     eGFR's persistently <90 mL/min signify possible Chronic Kidney     Disease.  GLUCOSE, CAPILLARY     Status: Abnormal   Collection Time    04/12/13  7:35 AM      Result Value Ref Range   Glucose-Capillary 121 (*) 70 - 99 mg/dL   Comment 1 Documented in Chart     Comment 2 Notify RN      Imaging / Studies: No results found.  Medications / Allergies:  per chart  Antibiotics: Anti-infectives   Start     Dose/Rate Route Frequency Ordered Stop   03/24/13 0800  ciprofloxacin (CIPRO) IVPB 400 mg  Status:  Discontinued     400 mg 200 mL/hr over 60 Minutes Intravenous Every 24 hours 03/23/13 1241 04/02/13 0839   03/23/13 1700  metroNIDAZOLE (FLAGYL) IVPB 500 mg  Status:  Discontinued     500 mg 100 mL/hr over 60 Minutes Intravenous Every 8 hours 03/23/13 1236 04/02/13 0839   03/23/13 0730  [MAR Hold]  ciprofloxacin (CIPRO) IVPB 400 mg     (On MAR Hold since 03/23/13 0805)   400 mg 200 mL/hr over 60 Minutes Intravenous On call to O.R. 03/23/13 9179 03/23/13 0824   03/23/13 0730  [MAR Hold]  metroNIDAZOLE (FLAGYL) IVPB 500 mg     (On MAR Hold since 03/23/13 0805)   500 mg 100 mL/hr over 60 Minutes Intravenous On call to O.R. 03/23/13 1505 03/23/13 0910      Assessment/Plan  SBO S/P SBR POD#20  -tolerating TF  -failed swallow evaluation 2/16 -activity as tolerated -add pain medication per tube Acute respiratory failure-s/p trach. Still requiring vent support at night. Wean per CCM. Appreciate assistance  Sepsis 2/2 peritonitis  Anemia of critical illness-s/p 1 unit of PRBCs 2/15.  H&h are stable this morning AKI-resolved  S/P PEG, getting TFs at goal rate. Diarrhea resolved, change Imodium to PRN  ID - off ABX, WBC at 11.9k. CT of abdomen noted post op changes, no abscess. Suggests infectious or inflammatory colitis, c diff negative 2/9. Diarrhea resolved. Deconditioned - PT/OT, up to chair. Continue foley until this improves some, she was requiring vent support more when foley was taken out on Saturday. Can try to remove later this week.  HTN-remains elevated.  Appreciate CCM assistance. PCM- TF via PEG, hyponatremia improved.  DM-CBGs, lantus and novolog  VTE - SCDs, lovenox  Dispo - ICU. LTACH when medically stable  Erby Pian, [facility] Surgery Pager [phone] Office [phone]  04/12/2013 9:32 AM

## 2013-04-12 NOTE — Progress Notes (Signed)
Name: Kristi Alexander MRN: [id number] DOB: 1925-03-31    ADMISSION DATE:  03/22/2013 CONSULTATION DATE:  1/28  REFERRING MD :  Dr Hulen Skains PRIMARY SERVICE: CCS  CHIEF COMPLAINT:  Vent and medical management   BRIEF PATIENT DESCRIPTION: 80 yo female with hx HTN admitted 1/27 with SBO.  Initially managed conservatively but cont to have worsening abd pain and increased WBC and was taken to OR 1/28 for exp lap and bowel resection.  Remained on vent post op and PCCM consulted.   SIGNIFICANT EVENTS / STUDIES:  1/27 admitted with SBO, conservative management  1/28 necrotic bowel suspected, to OR, 3-4 feet frankly necrotic bowel removed (no perforation)  2/01 Re-intubated; CT abd >> possible adhesions causing obstruction of SB, colon thickening ?infection or ischemia 2/08 On vent overnight ? Mucus plug 2/09 Off vent 2/10 on vent overnight >> mucus plug, transfuse 1 unit PRBC 2/11 on vent overnight for being worn out 2/12 unable to sleep off vent in early hours of AM, working too hard, placed back on vent 2/12 CT abd/pelvis >> SB ileus, ? Colitis transverse colon  LINES / TUBES: ETT 1/28>>>1/29; 2/1>>> 2/5, 2/5 >>2/6 L brachial aline 1/28>>> out Trach 2/6 >>  RUE PICC 2/1 >> G tube 2/6 >>   CULTURES: Urine 1/28>>> negative C.Diff >>> Neg   ANTIBIOTICS: Flagyl 1/28>>>2/7 Cipro 1/29 >> 2/7  SUBJECTIVE:  Still poorly tolerant of PS mode. Cognition intact. No distress  VITAL SIGNS: Temp:  [99.2 F (37.3 C)-100.2 F (37.9 C)] 99.7 F (37.6 C) (02/17 1109) Pulse Rate:  [57-108] 96 (02/17 1300) Resp:  [0-41] 22 (02/17 1300) BP: (142-183)/(50-94) 163/65 mmHg (02/17 1300) SpO2:  [89 %-98 %] 97 % (02/17 1300) FiO2 (%):  [30 %-40 %] 30 % (02/17 1118) Weight:  [78.2 kg (172 lb 6.4 oz)] 78.2 kg (172 lb 6.4 oz) (02/17 0500)   INTAKE / OUTPUT: Intake/Output     02/16 0701 - 02/17 0700 02/17 0701 - 02/18 0700   I.V. (mL/kg) 480 (6.1) 80 (1)   Blood     Other 30    NG/GT 1150 400    Total Intake(mL/kg) 1660 (21.2) 480 (6.1)   Urine (mL/kg/hr) 1670 (0.9) 205 (0.3)   Stool     Total Output 1670 205   Net -10 +275          PHYSICAL EXAMINATION: General: NAD Neuro: No focal deficits HEENT: trach site clean Cardiovascular: RRR s M Lungs: few rhonchi, no wheezes Abdomen: non tender, +BS Ext: no edema  LABS: I have reviewed all of today's lab results. Relevant abnormalities are discussed in the A/P section  CXR: pending  ASSESSMENT / PLAN: PULMONARY A: Acute respiratory failure, prolonged vent dependence Trach tube dependence Small Pleural effusions Intermittent mucus plugging P:   Cont vent wean as tolerated Repeat CXR 2/17  CARDIOVASCULAR A: Septic shock, resolved. Hx of HTN. P:  Cont current Rx  RENAL A: Acute kidney injury, resolved. Non-anion gap metabolic acidosis, resolved. Hypervolemia Hypernatremia >> improved from 2/11. Hypokalemia. P:   Monitor BMET intermittently Monitor I/Os Correct electrolytes as indicated   GASTROINTESTINAL A: SBO from ischemic bowel. Protein calorie malnutrition. Diarrhea (short gut ). Colitis, SB ileus on CT abd 2/12. P:   -post-op care per CCS -continue tube feeds per CCS  HEMATOLOGIC A: Anemia of critical illness P:  -trend CBC >> transfuse for Hb < 7 -lovenox for DVT prevention  INFECTIOUS A: Severe sepsis, resolved. Leukocytosis - resolved P:   Micro and  abx as above  ENDOCRINE A: Hyperglycemia, mild P:   Cont SSI  Decrease Lantus 2/17   NEUROLOGIC A: Post op pain. Deconditioning P:   -PRN fentanyl. -PT/OT as able. LTAC planned     Merton Border, MD Pulmonary and [place] Pager: [phone]  04/12/2013, 3:26 PM

## 2013-04-13 DIAGNOSIS — J9600 Acute respiratory failure, unspecified whether with hypoxia or hypercapnia: Secondary | ICD-10-CM

## 2013-04-13 LAB — BASIC METABOLIC PANEL
BUN: 17 mg/dL (ref 6–23)
CALCIUM: 8.3 mg/dL — AB (ref 8.4–10.5)
CO2: 30 mEq/L (ref 19–32)
Chloride: 108 mEq/L (ref 96–112)
Creatinine, Ser: 0.72 mg/dL (ref 0.50–1.10)
GFR calc Af Amer: 88 mL/min — ABNORMAL LOW (ref >90)
GFR calc non Af Amer: 76 mL/min — ABNORMAL LOW (ref >90)
Glucose, Bld: 110 mg/dL — ABNORMAL HIGH (ref 70–99)
Potassium: 3.3 mEq/L — ABNORMAL LOW (ref 3.7–5.3)
SODIUM: 146 meq/L (ref 137–147)

## 2013-04-13 LAB — CBC
HCT: 22.6 % — ABNORMAL LOW (ref 36.0–46.0)
Hemoglobin: 7.2 g/dL — ABNORMAL LOW (ref 12.0–15.0)
MCH: 26.3 pg (ref 26.0–34.0)
MCHC: 31.9 g/dL (ref 30.0–36.0)
MCV: 82.5 fL (ref 78.0–100.0)
Platelets: 300 K/uL (ref 150–400)
RBC: 2.74 MIL/uL — ABNORMAL LOW (ref 3.87–5.11)
RDW: 22 % — ABNORMAL HIGH (ref 11.5–15.5)
WBC: 10.7 K/uL — ABNORMAL HIGH (ref 4.0–10.5)

## 2013-04-13 LAB — GLUCOSE, CAPILLARY
GLUCOSE-CAPILLARY: 97 mg/dL (ref 70–99)
Glucose-Capillary: 102 mg/dL — ABNORMAL HIGH (ref 70–99)
Glucose-Capillary: 132 mg/dL — ABNORMAL HIGH (ref 70–99)
Glucose-Capillary: 82 mg/dL (ref 70–99)
Glucose-Capillary: 92 mg/dL (ref 70–99)
Glucose-Capillary: 98 mg/dL (ref 70–99)
Glucose-Capillary: 99 mg/dL (ref 70–99)

## 2013-04-13 MED ORDER — INSULIN GLARGINE 100 UNIT/ML ~~LOC~~ SOLN
5.0000 [IU] | Freq: Every day | SUBCUTANEOUS | Status: DC
  Administered 2013-04-14: 5 [IU] via SUBCUTANEOUS
  Filled 2013-04-13 (×2): qty 0.05

## 2013-04-13 MED ORDER — POTASSIUM CHLORIDE 20 MEQ/15ML (10%) PO LIQD
20.0000 meq | ORAL | Status: AC
  Administered 2013-04-13 (×2): 20 meq
  Filled 2013-04-13 (×2): qty 15

## 2013-04-13 MED ORDER — ALPRAZOLAM 0.25 MG PO TABS
0.2500 mg | ORAL_TABLET | Freq: Four times a day (QID) | ORAL | Status: DC | PRN
  Administered 2013-04-13 – 2013-04-18 (×8): 0.25 mg
  Filled 2013-04-13 (×9): qty 1

## 2013-04-13 MED ORDER — POTASSIUM CHLORIDE 20 MEQ/15ML (10%) PO LIQD
ORAL | Status: AC
  Administered 2013-04-14: 40 meq
  Filled 2013-04-13: qty 30

## 2013-04-13 MED ORDER — POTASSIUM CHLORIDE 20 MEQ/15ML (10%) PO LIQD
40.0000 meq | ORAL | Status: AC
  Administered 2013-04-14 (×2): 40 meq
  Filled 2013-04-13: qty 30

## 2013-04-13 MED ORDER — FREE WATER
200.0000 mL | Status: DC
  Administered 2013-04-13 – 2013-04-14 (×6): 200 mL

## 2013-04-13 NOTE — Progress Notes (Signed)
[facility] ADULT ICU REPLACEMENT PROTOCOL FOR AM LAB REPLACEMENT ONLY  The patient does apply for the [facility] Adult ICU Electrolyte Replacment Protocol based on the criteria listed below:   1. Is GFR >/= 40 ml/min? yes  Patient's GFR today is 88 2. Is urine output >/= 0.5 ml/kg/hr for the last 6 hours? yes Patient's UOP is 0.5 ml/kg/hr 3. Is BUN < 60 mg/dL? yes  Patient's BUN today is 17 4. Abnormal electrolyte(s): K 3.3 5. Ordered repletion with: per protocol 6. If a panic level lab has been reported, has the CCM MD in charge been notified? no.   Physician:    Ronda Fairly A 04/13/2013 5:32 AM

## 2013-04-13 NOTE — Progress Notes (Addendum)
[facility] states they are clinically able to take pt but admissions states they are unsure if a female bed is available. Admissions to check on pending discharges and call CSW back.  Addendum: waiting on call from [place]. [facility] in [place] now and submitting for insurance auth. CSW called admissions with [facility] in [place] asking for return call asap to talk about bed availability.  Addendum: CSW received call from [facility] admissions stating they have a pt possibly discharging tomorrow to go to a vent SNF in [place], and the facility is coming to do the assessment tomorrow and Marzetta Board will follow up with CSW about bed availability if pt at [facility] discharges tomorrow. Acceptance to [facility] depends on this pt discharging. Otherwise, a bed is opening next Wednesday and pt can have this bed if nothing opens before then.   Addendum: CSW called [facility] admissions and spoke with administrator, who is looking for faxed referral. CSW e-mailed documents to admissions as well so she can review referral and call CSW back. CSW explained pt is ready to go as soon as a bed is available.  Ky Barban, MSW, [facility] Clinical Social Worker [phone]

## 2013-04-13 NOTE — Progress Notes (Signed)
Patient having increased WOB on vent. Coughing frequently. HR dropping down as low as 28, and rebounding up into 100s. SR/ST with frequent PVCs. Have attempted to in-line suction; minimal secretions out with suctioning. Respiratory at bedside- breathing treatment administered. Air added to trach cuff. Lung sounds clear. Throat has congestion to auscultation. CCM MD notified of changes. Ran Chem 8 on istat- K 3.2. Pete NP at bedside. Order for potassium replacement via g-tube.    2345- Patient work of breathing is improving, but has not returned to baseline yet. O2 sat 98% on ventilator. Respiratory rate 25-30 breaths per minute. HR fluctuating from 38-110 frequently. Still having frequent PVCs. BP 146/83. Will continue to closely monitor. Richarda Blade RN

## 2013-04-13 NOTE — Progress Notes (Signed)
12 Days Post-Op  Subjective: reqing more vent support. Hasn't been able to do PS, some plugging with intermittent brady  Objective: Vital signs in last 24 hours: Temp:  [98.5 F (36.9 C)-100.2 F (37.9 C)] 98.5 F (36.9 C) (02/18 0717) Pulse Rate:  [69-101] 93 (02/18 0700) Resp:  [12-39] 26 (02/18 0700) BP: (136-178)/(44-84) 136/44 mmHg (02/18 0700) SpO2:  [93 %-100 %] 100 % (02/18 0700) FiO2 (%):  [30 %] 30 % (02/18 0700) Weight:  [168 lb 1.6 oz (76.25 kg)] 168 lb 1.6 oz (76.25 kg) (02/18 0630) Last BM Date: 04/12/13  Intake/Output from previous day: 02/17 0701 - 02/18 0700 In: 1640 [I.V.:440; NG/GT:1200] Out: 815 [Urine:815] Intake/Output this shift:    Awake, alert, MAE Fairly cta b/l with some upper resp secretions Reg Soft, nt, nd. g tube ok No significant edema. +SCDs. Mild edema Rt forehand  Lab Results:   Recent Labs  04/12/13 0445 04/13/13 0415  WBC 11.9* 10.7*  HGB 7.6* 7.2*  HCT 23.7* 22.6*  PLT 298 300   BMET  Recent Labs  04/12/13 0445 04/13/13 0415  NA 145 146  K 3.8 3.3*  CL 107 108  CO2 28 30  GLUCOSE 113* 110*  BUN 15 17  CREATININE 0.72 0.72  CALCIUM 8.6 8.3*   PT/INR No results found for this basename: LABPROT, INR,  in the last 72 hours ABG No results found for this basename: PHART, PCO2, PO2, HCO3,  in the last 72 hours  Studies/Results: Dg Chest Port 1 View  04/12/2013   CLINICAL DATA:  Followup respiratory failure  EXAM: PORTABLE CHEST - 1 VIEW  COMPARISON:  04/08/2013  FINDINGS: Cardiac shadow remains enlarged. A tracheostomy tube is again noted and stable. A right-sided PICC line is again seen and is withdrawn slightly in the interval but still lies within the superior vena cava. Bibasilar atelectatic/ infiltrative changes are noted which are of worsened in the interval from the prior exam particularly in the left lung base. No other focal abnormality is noted.  IMPRESSION: Increasing basilar changes particularly on the left.    Electronically Signed   By: Inez Catalina M.D.   On: 04/12/2013 11:27    Anti-infectives: Anti-infectives   Start     Dose/Rate Route Frequency Ordered Stop   03/24/13 0800  ciprofloxacin (CIPRO) IVPB 400 mg  Status:  Discontinued     400 mg 200 mL/hr over 60 Minutes Intravenous Every 24 hours 03/23/13 1241 04/02/13 0839   03/23/13 1700  metroNIDAZOLE (FLAGYL) IVPB 500 mg  Status:  Discontinued     500 mg 100 mL/hr over 60 Minutes Intravenous Every 8 hours 03/23/13 1236 04/02/13 0839   03/23/13 0730  [MAR Hold]  ciprofloxacin (CIPRO) IVPB 400 mg     (On MAR Hold since 03/23/13 0805)   400 mg 200 mL/hr over 60 Minutes Intravenous On call to O.R. 03/23/13 0728 03/23/13 0824   03/23/13 0730  [MAR Hold]  metroNIDAZOLE (FLAGYL) IVPB 500 mg     (On MAR Hold since 03/23/13 0805)   500 mg 100 mL/hr over 60 Minutes Intravenous On call to O.R. 03/23/13 4008 03/23/13 0910      Assessment/Plan: s/p Procedure(s): ESOPHAGOGASTRODUODENOSCOPY (EGD) (N/A) PERCUTANEOUS ENDOSCOPIC GASTROSTOMY (PEG) PLACEMENT (N/A) SBO S/P SBR POD#20  -tolerating TF  -failed swallow evaluation 2/16  -activity as tolerated  -add pain medication per tube  Acute respiratory failure-s/p trach. now requiring vent support during day and night. Wean per CCM. Appreciate assistance  Sepsis 2/2 peritonitis  Anemia of critical illness-s/p 1 unit of PRBCs 2/15. H&h are stable this morning  AKI-resolved  S/P PEG, getting TFs at goal rate. Diarrhea resolved, change Imodium to PRN  ID - off ABX, WBC at 10.7k. CT of abdomen noted post op changes, no abscess. Suggests infectious or inflammatory colitis, c diff negative 2/9. Diarrhea resolved.  Deconditioned - PT/OT, up to chair. Continue foley until this improves some, she was requiring vent support more when foley was taken out on Saturday. Can try to remove later this week.  HTN-remains elevated. Appreciate CCM assistance.  PCM- TF via PEG, hyponatremia improved.  DM-CBGs, lantus  and novolog  VTE - SCDs, lovenox  Dispo - ICU. LTACH ok with [place] if meets LTACH criteria  Kristi Alexander. Redmond Pulling, MD, FACS General, Bariatric, & Minimally Invasive Surgery [facility], [place]   LOS: 22 days    Gayland Curry 04/13/2013

## 2013-04-13 NOTE — Progress Notes (Addendum)
CSW and RNCM went to pt's room to speak with her about vent SNF, as this is what PT is recommending. PA explains MD does not want to pursue vent SNF at this time, and that he wants to continue attempting to wean her and that vent SNFs will not wean her but will instead leave her on vent/trach chronically. CSW called physician advisor to update on treatment plan.  Addendum: CSW received call from physician advisor asking about likelihood of pt weaning, and CSW explained there have been attempts to wean and that pt is on trach during the day and vent at night. MD believes pt could be weaned. CSW e-mailed admissions with [facility] SNF in [place] and [facility] in [place] to ask if they would actively attempt to wean pt from the vent, as this is the ultimate goal for pt. Family does not want pt to go to [facility], and would prefer placement out of state rather than her going to [facility]. CSW has made referral to vent SNF in [facility]) and e-mailed asking if they will actively wean pt as well. CSW to update medical team if facilities state they actively wean patients from vent.  Addendum: Administrators from [facility] and [facility] SNFs confirm they have weaning trials for pts and actively try to wean.  Ky Barban, MSW, [facility] Clinical Social Worker [phone]

## 2013-04-13 NOTE — Progress Notes (Signed)
SPEECH LANGUAGE PATHOLOGY  Pt with increasing dependence on vent.  Passy-Muir orders discontinued per MD.   Please re-order our services if warranted.  Taron Mondor L. Tivis Ringer, [place] CCC/SLP Pager [phone]

## 2013-04-13 NOTE — Progress Notes (Addendum)
Name: Kristi Alexander MRN: [id number] DOB: [birth year]    ADMISSION DATE:  03/22/2013 CONSULTATION DATE:  1/28  REFERRING MD :  Dr Hulen Skains PRIMARY SERVICE: CCS  CHIEF COMPLAINT:  Vent and medical management   BRIEF PATIENT DESCRIPTION: 80 yo female with hx HTN admitted 1/27 with SBO.  Initially managed conservatively but cont to have worsening abd pain and increased WBC and was taken to OR 1/28 for exp lap and bowel resection.  Remained on vent post op and PCCM consulted.   SIGNIFICANT EVENTS / STUDIES:  1/27 admitted with SBO, conservative management  1/28 necrotic bowel suspected, to OR, 3-4 feet frankly necrotic bowel removed (no perforation)  2/01 Re-intubated; CT abd >> possible adhesions causing obstruction of SB, colon thickening ?infection or ischemia 2/08 On vent overnight ? Mucus plug 2/09 Off vent 2/10 on vent overnight >> mucus plug, transfuse 1 unit PRBC 2/11 on vent overnight for being worn out 2/12 unable to sleep off vent in early hours of AM, working too hard, placed back on vent 2/12 CT abd/pelvis >> SB ileus, ? Colitis transverse colon  LINES / TUBES: ETT 1/28>>>1/29; 2/1>>> 2/5, 2/5 >>2/6 L brachial aline 1/28>>> out Trach 2/6 >>  RUE PICC 2/1 >> G tube 2/6 >>   CULTURES: Urine 1/28>>> negative C.Diff >>> Neg   ANTIBIOTICS: Flagyl 1/28>>>2/7 Cipro 1/29 >> 2/7  SUBJECTIVE:  Tolerates PS 15 cm H2O. Ambulating while on vent!. Cognition intact. No distress  VITAL SIGNS: Temp:  [98.5 F (36.9 C)-100.2 F (37.9 C)] 99 F (37.2 C) (02/18 1213) Pulse Rate:  [69-113] 101 (02/18 1200) Resp:  [16-42] 19 (02/18 1200) BP: (136-178)/(44-84) 144/63 mmHg (02/18 1200) SpO2:  [95 %-100 %] 99 % (02/18 1200) FiO2 (%):  [30 %] 30 % (02/18 1200) Weight:  [76.25 kg (168 lb 1.6 oz)] 76.25 kg (168 lb 1.6 oz) (02/18 0630)   INTAKE / OUTPUT: Intake/Output     02/17 0701 - 02/18 0700 02/18 0701 - 02/19 0700   I.V. (mL/kg) 440 (5.8) 100 (1.3)   Other  60   NG/GT  1200 250   Total Intake(mL/kg) 1640 (21.5) 410 (5.4)   Urine (mL/kg/hr) 815 (0.4) 130 (0.3)   Stool  1 (0)   Total Output 815 131   Net +825 +279        Stool Occurrence 1 x 1 x     PHYSICAL EXAMINATION: General: NAD Neuro: No focal deficits HEENT: trach site clean Cardiovascular: RRR s M Lungs: few rhonchi, no wheezes Abdomen: non tender, +BS Ext: no edema  LABS: I have reviewed all of today's lab results. Relevant abnormalities are discussed in the A/P section  CXR: 2/17 - L>R atx vs inf  ASSESSMENT / PLAN: PULMONARY A: Acute respiratory failure, prolonged vent dependence Trach tube dependence Small Pleural effusions Intermittent mucus plugging P:   Cont vent wean as tolerated  CARDIOVASCULAR A: Septic shock, resolved Hx of HTN. P:  Cont current Rx  RENAL A: Acute kidney injury, resolved. Non-anion gap metabolic acidosis, resolved. Hypervolemia Hypernatremia >> improved from 2/11. Hypokalemia. P:   Monitor BMET intermittently Monitor I/Os Correct electrolytes as indicated   GASTROINTESTINAL A: SBO from ischemic bowel. Protein calorie malnutrition. Diarrhea (short gut ). Colitis, SB ileus on CT abd 2/12. P:   -post-op care per CCS -continue tube feeds per CCS  HEMATOLOGIC A: Anemia of critical illness P:  -trend CBC >> transfuse for Hb < 7 -lovenox for DVT prevention  INFECTIOUS A: Severe sepsis, resolved.  Leukocytosis - resolved P:   Micro and abx as above  ENDOCRINE A: Hyperglycemia, mild No prior hx of DM P:   Cont SSI  Decrease Lantus 2/18   NEUROLOGIC A: Post op pain. Deconditioning Anxiety P:   PRN fentanyl. PT/OT as able. Add PRN alprazolam 2/18 Transfer to vent SDU if OK with CCS   Merton Border, MD Pulmonary and [place] Pager: [phone]  04/13/2013, 1:03 PM

## 2013-04-13 NOTE — Progress Notes (Addendum)
Physical Therapy Treatment Patient Details Name: Kristi Alexander MRN: [id number] DOB: 14-Dec-1925 Today's Date: 04/13/2013 Time: 3790-2409 PT Time Calculation (min): 23 min  PT Assessment / Plan / Recommendation  History of Present Illness Pt s/p exp. lap and resection of 4 foot section of necrotic bowel.  pt back to room intubated.  Extubated 1/29.  Trached 2/6 due to pt having trouble protecting airway.   PT Comments   Pt has continued to have trouble weaning off the vent, but with vent support, patient able to ambulate a good distance (200') without completely giving out.  Pt very willing and participative.   Follow Up Recommendations  SNF     Does the patient have the potential to tolerate intense rehabilitation     Barriers to Discharge        Equipment Recommendations  Other (comment) (TBA)    Recommendations for Other Services    Frequency Min 3X/week   Progress towards PT Goals Progress towards PT goals: Progressing toward goals  Plan Current plan remains appropriate    Precautions / Restrictions Precautions Precautions: Fall   Pertinent Vitals/Pain Resting HR  110's, EHR b/w 129-133 bpm; during gait EHR reached max 143    Mobility  Transfers Overall transfer level: Needs assistance Equipment used: Rolling walker (2 wheeled) Transfers: Sit to/from Stand Sit to Stand: Min assist Stand pivot transfers: +2 physical assistance General transfer comment: cues for hand placement and safety; assist to come forward and stand Ambulation/Gait Ambulation/Gait assistance: Min assist Ambulation Distance (Feet): 200 Feet Assistive device: Rolling walker (2 wheeled) Gait Pattern/deviations: Step-through pattern;Wide base of support Gait velocity: slow Gait velocity interpretation: Below normal speed for age/gender General Gait Details: Gait limited by decr activity toldrance with incr RR even though walked on the vent.    Exercises     PT Diagnosis:    PT Problem List:    PT Treatment Interventions:     PT Goals (current goals can now be found in the care plan section) Acute Rehab PT Goals Patient Stated Goal: 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 lap and resection of 4 foot section of necrotic bowel.  pt back to room intubated.  Extubated 1/29.  Trached 2/6 due to pt having trouble protecting airway.    Subjective Data  Subjective: That was good, but I'm tired. Patient Stated Goal: 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   Cognition  Cognition Arousal/Alertness: Awake/alert Behavior During Therapy: WFL for tasks assessed/performed Overall Cognitive Status: Within Functional Limits for tasks assessed    Balance  Balance Overall balance assessment: Needs assistance Sitting-balance support: Feet supported;No upper extremity supported Sitting balance-Leahy Scale: Fair Standing balance support: Bilateral upper extremity supported Standing balance-Leahy Scale: Fair  End of Session PT - End of Session Equipment Utilized During Treatment: Oxygen;Other (comment) (on vent support wi PEEP of 15) Activity Tolerance: Patient tolerated treatment well;Patient limited by fatigue Patient left: in chair;with call bell/phone within reach Nurse Communication: Mobility status   GP     [name], [name] 04/13/2013, 2:26 PM

## 2013-04-14 LAB — BASIC METABOLIC PANEL
BUN: 16 mg/dL (ref 6–23)
CO2: 27 meq/L (ref 19–32)
Calcium: 8 mg/dL — ABNORMAL LOW (ref 8.4–10.5)
Chloride: 106 mEq/L (ref 96–112)
Creatinine, Ser: 0.67 mg/dL (ref 0.50–1.10)
GFR calc Af Amer: 90 mL/min — ABNORMAL LOW (ref >90)
GFR, EST NON AFRICAN AMERICAN: 77 mL/min — AB (ref >90)
GLUCOSE: 156 mg/dL — AB (ref 70–99)
Potassium: 3.9 mEq/L (ref 3.7–5.3)
SODIUM: 141 meq/L (ref 137–147)

## 2013-04-14 LAB — GLUCOSE, CAPILLARY
GLUCOSE-CAPILLARY: 113 mg/dL — AB (ref 70–99)
GLUCOSE-CAPILLARY: 112 mg/dL — AB (ref 70–99)
GLUCOSE-CAPILLARY: 110 mg/dL — AB (ref 70–99)
Glucose-Capillary: 136 mg/dL — ABNORMAL HIGH (ref 70–99)
Glucose-Capillary: 134 mg/dL — ABNORMAL HIGH (ref 70–99)
Glucose-Capillary: 118 mg/dL — ABNORMAL HIGH (ref 70–99)

## 2013-04-14 MED ORDER — FAMOTIDINE 40 MG/5ML PO SUSR
20.0000 mg | Freq: Every day | ORAL | Status: DC
  Administered 2013-04-14 – 2013-04-18 (×5): 20 mg
  Filled 2013-04-14 (×6): qty 2.5

## 2013-04-14 MED ORDER — OXYCODONE HCL 5 MG/5ML PO SOLN
5.0000 mg | ORAL | Status: DC | PRN
  Administered 2013-04-15 – 2013-04-18 (×2): 5 mg
  Filled 2013-04-14 (×2): qty 5

## 2013-04-14 MED ORDER — INSULIN ASPART 100 UNIT/ML ~~LOC~~ SOLN
0.0000 [IU] | SUBCUTANEOUS | Status: DC
  Administered 2013-04-14: 2 [IU] via SUBCUTANEOUS
  Administered 2013-04-15 – 2013-04-19 (×16): 1 [IU] via SUBCUTANEOUS

## 2013-04-14 MED ORDER — FREE WATER
200.0000 mL | Freq: Four times a day (QID) | Status: DC
  Administered 2013-04-14 – 2013-04-16 (×7): 200 mL

## 2013-04-14 NOTE — Progress Notes (Signed)
UR Completed.  Vergie Living [phone] 04/14/2013

## 2013-04-14 NOTE — Progress Notes (Signed)
Seen and agree Await cardiology eval

## 2013-04-14 NOTE — Progress Notes (Signed)
Patient ID: Kristi Alexander, female   DOB: 04-29-1925, 80 y.o.   MRN: [id number] 13 Days Post-Op  Subjective: Patient had more pulmonary/cardiac issues overnight.  Loletha Grayer down into 20s adn had to give atropine.  Then she became tachy in the 130s, before settling back out in 80-90s.  No abdominal complaints.  Objective: Vital signs in last 24 hours: Temp:  [99 F (37.2 C)-100.4 F (38 C)] 100.4 F (38 C) (02/19 0346) Pulse Rate:  [61-126] 77 (02/19 1000) Resp:  [12-42] 22 (02/19 1000) BP: (124-188)/(45-144) 146/53 mmHg (02/19 1000) SpO2:  [95 %-100 %] 100 % (02/19 1000) FiO2 (%):  [30 %-40 %] 40 % (02/19 1000) Weight:  [171 lb 15.3 oz (78 kg)] 171 lb 15.3 oz (78 kg) (02/19 0600) Last BM Date: 04/12/13  Intake/Output from previous day: 02/18 0701 - 02/19 0700 In: 2780 [I.V.:520; NG/GT:2200] Out: 976 [Urine:975; Stool:1] Intake/Output this shift: Total I/O In: 440 [I.V.:60; Other:30; NG/GT:350] Out: 140 [Urine:140]  PE: Abd: soft, NT, ND, +BS, g-tube in place and tolerating TFs, incision well healed  Lab Results:   Recent Labs  04/12/13 0445 04/13/13 0415  WBC 11.9* 10.7*  HGB 7.6* 7.2*  HCT 23.7* 22.6*  PLT 298 300   BMET  Recent Labs  04/13/13 0415 04/14/13 0430  NA 146 141  K 3.3* 3.9  CL 108 106  CO2 30 27  GLUCOSE 110* 156*  BUN 17 16  CREATININE 0.72 0.67  CALCIUM 8.3* 8.0*   PT/INR No results found for this basename: LABPROT, INR,  in the last 72 hours CMP     Component Value Date/Time   NA 141 04/14/2013 0430   K 3.9 04/14/2013 0430   CL 106 04/14/2013 0430   CO2 27 04/14/2013 0430   GLUCOSE 156* 04/14/2013 0430   BUN 16 04/14/2013 0430   CREATININE 0.67 04/14/2013 0430   CALCIUM 8.0* 04/14/2013 0430   PROT 4.9* 03/31/2013 0405   ALBUMIN 1.7* 03/31/2013 0405   AST 19 03/31/2013 0405   ALT 14 03/31/2013 0405   ALKPHOS 75 03/31/2013 0405   BILITOT 0.4 03/31/2013 0405   GFRNONAA 77* 04/14/2013 0430   GFRAA 90* 04/14/2013 0430   Lipase  No results found for  this basename: lipase       Studies/Results: Dg Chest Port 1 View  04/12/2013   CLINICAL DATA:  Followup respiratory failure  EXAM: PORTABLE CHEST - 1 VIEW  COMPARISON:  04/08/2013  FINDINGS: Cardiac shadow remains enlarged. A tracheostomy tube is again noted and stable. A right-sided PICC line is again seen and is withdrawn slightly in the interval but still lies within the superior vena cava. Bibasilar atelectatic/ infiltrative changes are noted which are of worsened in the interval from the prior exam particularly in the left lung base. No other focal abnormality is noted.  IMPRESSION: Increasing basilar changes particularly on the left.   Electronically Signed   By: Inez Catalina M.D.   On: 04/12/2013 11:27    Anti-infectives: Anti-infectives   Start     Dose/Rate Route Frequency Ordered Stop   03/24/13 0800  ciprofloxacin (CIPRO) IVPB 400 mg  Status:  Discontinued     400 mg 200 mL/hr over 60 Minutes Intravenous Every 24 hours 03/23/13 1241 04/02/13 0839   03/23/13 1700  metroNIDAZOLE (FLAGYL) IVPB 500 mg  Status:  Discontinued     500 mg 100 mL/hr over 60 Minutes Intravenous Every 8 hours 03/23/13 1236 04/02/13 0839   03/23/13 0730  [MAR Hold]  ciprofloxacin (CIPRO) IVPB 400 mg     (On MAR Hold since 03/23/13 0805)   400 mg 200 mL/hr over 60 Minutes Intravenous On call to O.R. 03/23/13 0728 03/23/13 0824   03/23/13 0730  [MAR Hold]  metroNIDAZOLE (FLAGYL) IVPB 500 mg     (On MAR Hold since 03/23/13 0805)   500 mg 100 mL/hr over 60 Minutes Intravenous On call to O.R. 03/23/13 0728 03/23/13 0910       Assessment/Plan  SBO S/P SBR POD#22  -tolerating TF  -failed swallow evaluation 2/16  -activity as tolerated  -add pain medication per tube  Acute respiratory failure-s/p trach. Patient becoming more and more vent dependent as time goes on.  She continues to have pulmonary issues at night and now brady response requiring atropine overnight, then gets tachy.  Cardiology consult  called today to evaluate. Sepsis 2/2 peritonitis, resolved Anemia of critical illness -stable AKI-resolved  S/P PEG, getting TFs at goal rate. Diarrhea resolved, Imodium to PRN  ID - off ABX Deconditioned - PT/OT, up to chair. Continue foley until this improves. HTN-remains elevated. Appreciate CCM assistance.  PCM- TF via PEG, hyponatremia improved.  DM-CBGs, lantus and novolog  VTE - SCDs, lovenox  Dispo - hold SDU transfer until after cardiology can evaluate the patient.    LOS: 23 days    Mustaf Antonacci E 04/14/2013, 10:38 AM Pager: [phone]

## 2013-04-14 NOTE — Progress Notes (Signed)
Name: Kristi Alexander MRN: [id number] DOB: 15-Apr-1925    ADMISSION DATE:  03/22/2013 CONSULTATION DATE:  1/28  REFERRING MD :  Dr Hulen Skains PRIMARY SERVICE: CCS  CHIEF COMPLAINT:  Vent and medical management   BRIEF PATIENT DESCRIPTION: 80 yo female with hx HTN admitted 1/27 with SBO.  Initially managed conservatively but cont to have worsening abd pain and increased WBC and was taken to OR 1/28 for exp lap and bowel resection.  Remained on vent post op and PCCM consulted.   SIGNIFICANT EVENTS / STUDIES:  1/27 admitted with SBO, conservative management  1/28 necrotic bowel suspected, to OR, 3-4 feet frankly necrotic bowel removed (no perforation)  2/01 Re-intubated; CT abd: possible adhesions causing obstruction of SB, colon thickening ?infection or ischemia 2/08 On vent overnight ? Mucus plug 2/09 Off vent 2/10 on vent overnight >> mucus plug, transfuse 1 unit PRBC 2/11 on vent overnight for being worn out 2/12 unable to sleep off vent in early hours of AM, working too hard, placed back on vent 2/12 CT abd/pelvis >> SB ileus, ? Colitis transverse colon 2/16-2/18 unable to perform ATC. Weaned on PSV. Ambulating with assistance (on vent!) 2/19 comfortable on ATC  LINES / TUBES: ETT 1/28>>>1/29; 2/1>>> 2/5, 2/5 >>2/6 L brachial aline 1/28>>> out Trach 2/6 >>  RUE PICC 2/1 >> G tube 2/6 >>   CULTURES: Urine 1/28>>> negative C.Diff >>> Neg   ANTIBIOTICS: Flagyl 1/28>>>2/7 Cipro 1/29 >> 2/7  SUBJECTIVE:  No distress on ATC. Good spirits  VITAL SIGNS: Temp:  [99.1 F (37.3 C)-100.4 F (38 C)] 99.6 F (37.6 C) (02/19 1148) Pulse Rate:  [61-126] 89 (02/19 1300) Resp:  [16-32] 21 (02/19 1300) BP: (117-188)/(45-126) 150/52 mmHg (02/19 1300) SpO2:  [95 %-100 %] 99 % (02/19 1300) FiO2 (%):  [30 %-40 %] 30 % (02/19 1200) Weight:  [78 kg (171 lb 15.3 oz)] 78 kg (171 lb 15.3 oz) (02/19 0600)   INTAKE / OUTPUT: Intake/Output     02/18 0701 - 02/19 0700 02/19 0701 - 02/20  0700   I.V. (mL/kg) 520 (6.7) 120 (1.5)   Other 60 60   NG/GT 2200 500   Total Intake(mL/kg) 2780 (35.6) 680 (8.7)   Urine (mL/kg/hr) 975 (0.5) 285 (0.4)   Stool 1 (0)    Total Output 976 285   Net +1804 +395        Stool Occurrence 1 x 1 x     PHYSICAL EXAMINATION: General: NAD Neuro: No focal deficits HEENT: trach site clean Cardiovascular: RRR s M Lungs: few rhonchi, no wheezes Abdomen: non tender, +BS Ext: no edema  LABS: I have reviewed all of today's lab results. Relevant abnormalities are discussed in the A/P section  CXR: NNF  ASSESSMENT / PLAN: PULMONARY A: Acute respiratory failure, prolonged vent dependence Trach tube dependence Small Pleural effusions Intermittent mucus plugging P:   Cont vent wean as tolerated Follow CXR intermittently  CARDIOVASCULAR A: Septic shock, resolved Hx of HTN. P:  Cont current Rx  RENAL A: Acute kidney injury, resolved. Non-anion gap metabolic acidosis, resolved. Hypervolemia Hypernatremia, resolved Hypokalemia. P:   Monitor BMET intermittently Monitor I/Os Correct electrolytes as indicated   GASTROINTESTINAL A: SBO from ischemic bowel. S/P laparotomy Protein calorie malnutrition. Diarrhea (short gut ). Colitis, SB ileus on CT abd 2/12. P:   -post-op care per CCS -continue tube feeds per CCS  HEMATOLOGIC A: Anemia of critical illness P:  -trend CBC >> transfuse for Hb < 7 -lovenox for DVT  prevention  INFECTIOUS A: Severe sepsis, resolved. Leukocytosis - resolved P:   Micro and abx as above  ENDOCRINE A: Hyperglycemia, mild No prior hx of DM P:   Cont SSI  Decrease Lantus 2/18   NEUROLOGIC A: Post op pain. Deconditioning Anxiety P:   PRN fentanyl. PT/OT as able. Cont PRN alprazolam Transfer to vent SDU Ultimately will need rehab in CIR or SNF   Merton Border, MD Pulmonary and [place] Pager: [phone]  04/14/2013, 3:11 PM

## 2013-04-14 NOTE — Progress Notes (Signed)
Chaplain responded to request for healthcare power of attorney assistance. Provided shorter Advance Directive documents, explained them to patient and her daughter. Patient said she only wished to complete a healthcare power of attorney because she already has a living will through her attorney. Patient and her daughter completed the paperwork. Patient said she fully understood the material and the decisions she made. Chaplain could not locate an available notary. Will follow up Thursday afternoon to notarize document. Patient and family were grateful for assistance.   [facility]: [phone]

## 2013-04-14 NOTE — Progress Notes (Signed)
Chaplain followed up with patient to notarize advance directive. Facilitated process with notary and two witnesses. Confirmed that patient was competent and expressed full understanding of documents and decisions therein. Provided patient with additional copies of AD for herself, both healthcare power of attorneys, her PCP and several extra. Provided a copy to patient's nurse for patient's file. Patient was grateful for chaplain assistance.   [facility]: [phone]

## 2013-04-14 NOTE — Progress Notes (Signed)
Dr Terrence Dupont updated pt HR decrease to 50s (SB) with suction, change of inner cannula this afternoon, pt not symptomatic. Pt recovers to SR 70-90s. Will obtain EKG with sustained SB <50, MD reviewed EKG from today with rounds earlier today. Reather Laurence

## 2013-04-14 NOTE — Consult Note (Signed)
Reason for Consult: Recurrent bradycardia in the setting of hypoxia/mucous plug Referring Physician: CCS  Kristi Alexander is an 80 y.o. female.  HPI: Patient is 80 year old female with past medical history significant for mild coronary artery disease, hypertension, hypercholesteremia, GERD, degenerative joint disease, was admitted on 03/22/2013 with generalized abdominal pain and was noted to have small bowel obstruction patient was initially managed conservatively but due to increasing worsening pain and white count patient was taken to or requiring exploratory laparotomy and small bowel resection. Postoperatively patient has remained vent dependent requiring tracheostomy and PEG tube placement. Cardiologic consultation is called as patient was noted to have marked sinus bradycardia in setting of mucous plug/hypoxia requiring frequent suctioning and then atropine. Patient did not had any further episodes of bradycardia in the last 12 hours. Patient is awake alert comfortable watching TV denies any dizziness chest pain lightheadedness. Denies any abdominal pain. Patient not on any AV blocking medications.  Past Medical History  Diagnosis Date  . Restless leg syndrome   . Hypertension   . Dyslipidemia   . History of appendectomy   . H/O: hysterectomy   . Cyst of breast, right, benign solitary   . Inguinal hernia   . History of lumbosacral spine surgery     Past Surgical History  Procedure Laterality Date  . Appendectomy    . Abdominal hysterectomy    . Laparotomy N/A 03/23/2013    Procedure: EXPLORATORY LAPAROTOMY;  Surgeon: Gwenyth Ober, MD;  Location: [place];  Service: General;  Laterality: N/A;  . Bowel resection N/A 03/23/2013    Procedure: SMALL BOWEL RESECTION;  Surgeon: Gwenyth Ober, MD;  Location: [place];  Service: General;  Laterality: N/A;  . Tracheostomy      feinstein  . Esophagogastroduodenoscopy N/A 04/01/2013    Procedure: ESOPHAGOGASTRODUODENOSCOPY (EGD);  Surgeon: Gwenyth Ober, MD;  Location: [place];  Service: General;  Laterality: N/A;  . Peg placement N/A 04/01/2013    Procedure: PERCUTANEOUS ENDOSCOPIC GASTROSTOMY (PEG) PLACEMENT;  Surgeon: Gwenyth Ober, MD;  Location: [facility];  Service: General;  Laterality: N/A;    Family History  Problem Relation Age of Onset  . Heart attack Father   . Lung cancer Brother   . Healthy Sister     Social History:  reports that she has been smoking Cigarettes.  She has been smoking about 0.00 packs per day. She has never used smokeless tobacco. She reports that she drinks alcohol. She reports that she does not use illicit drugs.  Allergies:  Allergies  Allergen Reactions  . Penicillins Swelling    Medications: I have reviewed the patient's current medications.  Results for orders placed during the hospital encounter of 03/22/13 (from the past 48 hour(s))  GLUCOSE, CAPILLARY     Status: None   Collection Time    04/12/13  7:12 PM      Result Value Ref Range   Glucose-Capillary 96  70 - 99 mg/dL   Comment 1 Documented in Chart     Comment 2 Notify RN    GLUCOSE, CAPILLARY     Status: None   Collection Time    04/13/13 12:02 AM      Result Value Ref Range   Glucose-Capillary 92  70 - 99 mg/dL   Comment 1 Documented in Chart     Comment 2 Notify RN    GLUCOSE, CAPILLARY     Status: Abnormal   Collection Time    04/13/13  4:07 AM  Result Value Ref Range   Glucose-Capillary 102 (*) 70 - 99 mg/dL   Comment 1 Documented in Chart     Comment 2 Notify RN    BASIC METABOLIC PANEL     Status: Abnormal   Collection Time    04/13/13  4:15 AM      Result Value Ref Range   Sodium 146  137 - 147 mEq/L   Potassium 3.3 (*) 3.7 - 5.3 mEq/L   Chloride 108  96 - 112 mEq/L   CO2 30  19 - 32 mEq/L   Glucose, Bld 110 (*) 70 - 99 mg/dL   BUN 17  6 - 23 mg/dL   Creatinine, Ser 0.72  0.50 - 1.10 mg/dL   Calcium 8.3 (*) 8.4 - 10.5 mg/dL   GFR calc non Af Amer 76 (*) >90 mL/min   GFR calc Af Amer 88 (*) >90  mL/min   Comment: (NOTE)     The eGFR has been calculated using the CKD EPI equation.     This calculation has not been validated in all clinical situations.     eGFR's persistently <90 mL/min signify possible Chronic Kidney     Disease.  CBC     Status: Abnormal   Collection Time    04/13/13  4:15 AM      Result Value Ref Range   WBC 10.7 (*) 4.0 - 10.5 K/uL   RBC 2.74 (*) 3.87 - 5.11 MIL/uL   Hemoglobin 7.2 (*) 12.0 - 15.0 g/dL   HCT 22.6 (*) 36.0 - 46.0 %   MCV 82.5  78.0 - 100.0 fL   MCH 26.3  26.0 - 34.0 pg   MCHC 31.9  30.0 - 36.0 g/dL   RDW 22.0 (*) 11.5 - 15.5 %   Platelets 300  150 - 400 K/uL  GLUCOSE, CAPILLARY     Status: Abnormal   Collection Time    04/13/13  7:13 AM      Result Value Ref Range   Glucose-Capillary 132 (*) 70 - 99 mg/dL   Comment 1 Notify RN    GLUCOSE, CAPILLARY     Status: None   Collection Time    04/13/13 12:11 PM      Result Value Ref Range   Glucose-Capillary 82  70 - 99 mg/dL  GLUCOSE, CAPILLARY     Status: None   Collection Time    04/13/13  3:25 PM      Result Value Ref Range   Glucose-Capillary 98  70 - 99 mg/dL  GLUCOSE, CAPILLARY     Status: None   Collection Time    04/13/13  7:41 PM      Result Value Ref Range   Glucose-Capillary 97  70 - 99 mg/dL   Comment 1 Documented in Chart     Comment 2 Notify RN    GLUCOSE, CAPILLARY     Status: None   Collection Time    04/13/13 11:20 PM      Result Value Ref Range   Glucose-Capillary 99  70 - 99 mg/dL   Comment 1 Documented in Chart     Comment 2 Notify RN    GLUCOSE, CAPILLARY     Status: Abnormal   Collection Time    04/14/13  3:40 AM      Result Value Ref Range   Glucose-Capillary 136 (*) 70 - 99 mg/dL   Comment 1 Documented in Chart     Comment 2 Notify RN  BASIC METABOLIC PANEL     Status: Abnormal   Collection Time    04/14/13  4:30 AM      Result Value Ref Range   Sodium 141  137 - 147 mEq/L   Potassium 3.9  3.7 - 5.3 mEq/L   Chloride 106  96 - 112 mEq/L   CO2  27  19 - 32 mEq/L   Glucose, Bld 156 (*) 70 - 99 mg/dL   BUN 16  6 - 23 mg/dL   Creatinine, Ser 0.67  0.50 - 1.10 mg/dL   Calcium 8.0 (*) 8.4 - 10.5 mg/dL   GFR calc non Af Amer 77 (*) >90 mL/min   GFR calc Af Amer 90 (*) >90 mL/min   Comment: (NOTE)     The eGFR has been calculated using the CKD EPI equation.     This calculation has not been validated in all clinical situations.     eGFR's persistently <90 mL/min signify possible Chronic Kidney     Disease.  GLUCOSE, CAPILLARY     Status: Abnormal   Collection Time    04/14/13  7:25 AM      Result Value Ref Range   Glucose-Capillary 113 (*) 70 - 99 mg/dL  GLUCOSE, CAPILLARY     Status: Abnormal   Collection Time    04/14/13 11:50 AM      Result Value Ref Range   Glucose-Capillary 134 (*) 70 - 99 mg/dL  GLUCOSE, CAPILLARY     Status: Abnormal   Collection Time    04/14/13  4:00 PM      Result Value Ref Range   Glucose-Capillary 112 (*) 70 - 99 mg/dL    No results found.  Review of Systems  Constitutional: Negative for fever and chills.  Respiratory: Negative for cough, hemoptysis, sputum production and shortness of breath.   Cardiovascular: Negative for chest pain and palpitations.  Gastrointestinal: Negative for nausea, vomiting and abdominal pain.  Neurological: Negative for dizziness.   Blood pressure 138/93, pulse 85, temperature 99.6 F (37.6 C), temperature source Oral, resp. rate 24, height 5' 2" (1.575 m), weight 78 kg (171 lb 15.3 oz), SpO2 100.00%. Physical Exam  Constitutional: She is oriented to person, place, and time.  HENT:  Head: Normocephalic and atraumatic.  Neck: Normal range of motion. Neck supple. No JVD present.  Cardiovascular: Normal rate and regular rhythm.   Murmur (Soft systolic murmur noted no S3 gallop) heard. Respiratory:  Decreased breath sound at bases with occasional rhonchi  GI: Soft. Bowel sounds are normal. There is no tenderness.  Musculoskeletal:  No clubbing cyanosis 1+ edema   Neurological: She is alert and oriented to person, place, and time.    Assessment/Plan: Status post sinus bradycardia in the setting of mucous plug/hypoxia/increased vagal tone Status post expiratory laparotomy and resection of necrotic small bowel for strangulated small bowel Mild coronary artery disease Hypertension GERD Hypercholesteremia Protein calorie malnutrition Chronic respiratory failure/atelectasis Post op anemia Plan Continue present management Pulmonary toilet No need for pacemaker at this point, doubt patient has tachybrady syndrome EKG when necessary when patient is bradycardic  Natalea Sutliff N 04/14/2013, 5:18 PM

## 2013-04-15 LAB — GLUCOSE, CAPILLARY
GLUCOSE-CAPILLARY: 135 mg/dL — AB (ref 70–99)
Glucose-Capillary: 114 mg/dL — ABNORMAL HIGH (ref 70–99)
Glucose-Capillary: 125 mg/dL — ABNORMAL HIGH (ref 70–99)
Glucose-Capillary: 131 mg/dL — ABNORMAL HIGH (ref 70–99)
Glucose-Capillary: 107 mg/dL — ABNORMAL HIGH (ref 70–99)

## 2013-04-15 MED ORDER — POTASSIUM CHLORIDE 20 MEQ/15ML (10%) PO LIQD
40.0000 meq | Freq: Once | ORAL | Status: AC
  Administered 2013-04-15: 40 meq
  Filled 2013-04-15: qty 30

## 2013-04-15 MED ORDER — LEVALBUTEROL HCL 0.63 MG/3ML IN NEBU
0.6300 mg | INHALATION_SOLUTION | Freq: Four times a day (QID) | RESPIRATORY_TRACT | Status: DC
  Administered 2013-04-15 – 2013-04-19 (×18): 0.63 mg via RESPIRATORY_TRACT
  Filled 2013-04-15 (×28): qty 3

## 2013-04-15 MED ORDER — LEVALBUTEROL HCL 0.63 MG/3ML IN NEBU: 0.6300 mg | INHALATION_SOLUTION | RESPIRATORY_TRACT | Status: DC | PRN

## 2013-04-15 MED ORDER — FUROSEMIDE 10 MG/ML IJ SOLN
40.0000 mg | Freq: Once | INTRAMUSCULAR | Status: AC
  Administered 2013-04-15: 40 mg via INTRAVENOUS
  Filled 2013-04-15: qty 4

## 2013-04-15 MED ORDER — BUDESONIDE 0.25 MG/2ML IN SUSP
0.2500 mg | Freq: Four times a day (QID) | RESPIRATORY_TRACT | Status: DC
  Administered 2013-04-15 – 2013-04-19 (×16): 0.25 mg via RESPIRATORY_TRACT
  Filled 2013-04-15 (×21): qty 2

## 2013-04-15 MED ORDER — LEVALBUTEROL HCL 0.63 MG/3ML IN NEBU: 0.6300 mg | INHALATION_SOLUTION | Freq: Four times a day (QID) | RESPIRATORY_TRACT | Status: DC

## 2013-04-15 MED ORDER — BUDESONIDE 0.25 MG/2ML IN SUSP
0.2500 mg | Freq: Four times a day (QID) | RESPIRATORY_TRACT | Status: DC
  Filled 2013-04-15 (×4): qty 2

## 2013-04-15 NOTE — Progress Notes (Signed)
Pt had brief episode of bradycardia (HR 33) when inner canula was changed. Pt spontaneously recovered within 10 seconds to a NSR of 88. O2 sats remained 100% throughout. RR stable. Pt denies shortness or breath or chest pain.  Will continue to monitor closely.

## 2013-04-15 NOTE — Progress Notes (Addendum)
LATE ENTRY  Clinical Social Work Department CLINICAL SOCIAL WORK PLACEMENT NOTE 04/15/2013  Patient:  Kristi Alexander, Kristi Alexander  Account Number:  [id number] Admit date:  03/22/2013  Clinical Social Worker:  Ky Barban, Latanya Presser  Date/time:  04/14/2013 02:12 PM  Clinical Social Work is seeking post-discharge placement for this patient at the following level of care:   [place]   (*CSW will update this form in Epic as items are completed)   N/A-clinicals sent to [facility], [facility], [facility], and [facility] vent SNFs. Asked facilities to put pt on waiting list and clinically assess her but informed facilities medical team continues to attempt to wean pt here in hospital and we are not seeking placement at this immediate time  04/19/13--clinicals sent to trach SNFs in [place] provided with [place] Department of Clinical Social Work's list of facilities offering this level of care within the geographic area requested by the patient (or if unable, by the patient's family).  04/14/2013  Patient/family informed of their freedom to choose among providers that offer the needed level of care, that participate in Medicare, Medicaid or managed care program needed by the patient, have an available bed and are willing to accept the patient.  N/A-clinicals not sent to this SNF as pt is on trach support  Patient/family informed of [facility]' ownership interest in [facility], as well as of the fact that they are under no obligation to receive care at this facility.  PASARR submitted to EDS on 04/14/2013 PASARR number received from EDS on 04/14/2013  FL2 transmitted to all facilities in geographic area requested by pt/family on  04/14/2013 FL2 transmitted to all facilities within larger geographic area on 04/14/2013  Patient informed that his/her managed care company has contracts with or will negotiate with  certain facilities, including the following:      Patient/family informed of bed offers received:  04/19/13 Patient chooses bed at [facility] and [place] recommends and patient chooses bed at    Patient to be transferred to [facility] [employer] on 04/19/13   Patient to be transferred to facility by [facility]  The following physician request were entered in Epic:   Additional Comments: Pt listed as partial code in chart, but when RN spoke with pt she explained she wants to be full code. CSW called NP and asked her to change this in Epic, and NP states she will do this. CSW scheduled PTAR for 5pm online. Informed pt's family she is discharging today to [facility].   Ky Barban, MSW, [facility] Clinical Social Worker [phone]

## 2013-04-15 NOTE — Progress Notes (Signed)
CSW noticed MD note from this afternoon: "Ultimately will need rehab in CIR or SNF." CSW received call from admissions with [facility] in [place] that they are a weaning facility, and they will begin insurance authorization for pt. CSW explained MD now recommending discharge to vent SNF, and CSW will follow up with [facility] on Monday concerning placement.   Ky Barban, MSW, [facility] Clinical Social Worker [phone]

## 2013-04-15 NOTE — Progress Notes (Signed)
Name: Kristi Alexander MRN: [id number] DOB: [birth year]    ADMISSION DATE:  03/22/2013 CONSULTATION DATE:  1/28  REFERRING MD :  Dr Hulen Skains PRIMARY SERVICE: CCS  CHIEF COMPLAINT:  Vent and medical management   BRIEF PATIENT DESCRIPTION: 80 yo female with hx HTN admitted 1/27 with SBO.  Initially managed conservatively but cont to have worsening abd pain and increased WBC and was taken to OR 1/28 for exp lap and bowel resection.  Remained on vent post op and PCCM consulted.   SIGNIFICANT EVENTS / STUDIES:  1/27 admitted with SBO, conservative management  1/28 necrotic bowel suspected, to OR, 3-4 feet frankly necrotic bowel removed (no perforation)  2/01 Re-intubated; CT abd: possible adhesions causing obstruction of SB, colon thickening ?infection or ischemia 2/08 On vent overnight ? Mucus plug 2/09 Off vent 2/10 on vent overnight >> mucus plug, transfuse 1 unit PRBC 2/11 on vent overnight for being worn out 2/12 unable to sleep off vent in early hours of AM, working too hard, placed back on vent 2/12 CT abd/pelvis >> SB ileus, ? Colitis transverse colon 2/16-2/18 unable to perform ATC. Weaned on PSV. Ambulating with assistance (on vent!) 2/19 comfortable on ATC  LINES / TUBES: ETT 1/28>>>1/29; 2/1>>> 2/5, 2/5 >>2/6 L brachial aline 1/28>>> out Trach 2/6 >>  RUE PICC 2/1 >> G tube 2/6 >>   CULTURES: Urine 1/28>>> negative C.Diff >>> Neg   ANTIBIOTICS: Flagyl 1/28>>>2/7 Cipro 1/29 >> 2/7  SUBJECTIVE:  Mild increase WOB on ATC  VITAL SIGNS: Temp:  [97.9 F (36.6 C)-99.6 F (37.6 C)] 98 F (36.7 C) (02/20 1200) Pulse Rate:  [64-116] 101 (02/20 1200) Resp:  [11-27] 21 (02/20 1200) BP: (138-176)/(48-125) 172/61 mmHg (02/20 1200) SpO2:  [98 %-100 %] 98 % (02/20 1200) FiO2 (%):  [30 %-40 %] 40 % (02/20 1200) Weight:  [79.2 kg (174 lb 9.7 oz)] 79.2 kg (174 lb 9.7 oz) (02/20 0500)   INTAKE / OUTPUT: Intake/Output     02/19 0701 - 02/20 0700 02/20 0701 - 02/21 0700   I.V. (mL/kg) 480 (6.1) 80 (1)   Other 60 60   NG/GT 2000 400   Total Intake(mL/kg) 2540 (32.1) 540 (6.8)   Urine (mL/kg/hr) 1160 (0.6) 870 (1.4)   Stool     Total Output 1160 870   Net +1380 -330        Stool Occurrence 1 x      PHYSICAL EXAMINATION: General: NAD Neuro: No focal deficits HEENT: trach site clean Cardiovascular: RRR s M Lungs: few anterior wheezes Abdomen: non tender, +BS Ext: trace edema  LABS: I have reviewed all of today's lab results. Relevant abnormalities are discussed in the A/P section  CXR: NNF  ASSESSMENT / PLAN: PULMONARY A: Acute respiratory failure, prolonged vent dependence Trach tube dependence Small Pleural effusions Intermittent mucus plugging P:   Cont vent wean on ATC as tolerated Follow CXR intermittently  CARDIOVASCULAR A: Septic shock, resolved Hx of HTN. P:  Cont current Rx  RENAL A: Acute kidney injury, resolved. Non-anion gap metabolic acidosis, resolved. Mild hypervolemia Hypernatremia, resolved Hypokalemia. P:   Monitor BMET intermittently Monitor I/Os Correct electrolytes as indicated Lasix X 1 2/20    GASTROINTESTINAL A: SBO from ischemic bowel. S/P laparotomy Protein calorie malnutrition. Diarrhea (short gut ). Colitis, SB ileus on CT abd 2/12. P:   Post-op care and per CCS   HEMATOLOGIC A: Anemia of critical illness P:  trend CBC >> transfuse for Hb < 7 lovenox for DVT  prevention  INFECTIOUS A: Severe sepsis, resolved. Leukocytosis - resolved P:   Micro and abx as above  ENDOCRINE A: Hyperglycemia, mild No prior hx of DM P:   DC Lantus 2/20 Cont SSI for now, consider DC if CBGs remain low off Lantus  NEUROLOGIC A: Post op pain. Deconditioning Anxiety P:   PRN fentanyl. PT/OT as able. Cont PRN alprazolam Transfer to vent SDU ordered 2/19 Ultimately will need rehab in CIR or SNF   Merton Border, MD Pulmonary and [place] Pager: [phone]  04/15/2013, 2:54 PM

## 2013-04-15 NOTE — Progress Notes (Signed)
04/15/13 2130  Vent Select  Invasive or Noninvasive Invasive  Adult Vent Y  Tracheostomy Shiley 6 mm Cuffed  Placement Date/Time: 04/01/13 0900   Placed By: ICU physician  Brand: Shiley  Size (mm): 6 mm  Style: Cuffed  Status Secured  Site Assessment Clean;Dry  Site Care Dressing applied  Emergency Equipment at bedside Yes  Adult Ventilator Settings  Vent Type Servo i  Humidity HME  Vent Mode PRVC  Vt Set 500 mL  Set Rate 15 bmp  FiO2 (%) 30 %  I Time 0.8 Sec(s)  PEEP 5 cmH20  Adult Ventilator Measurements  Peak Airway Pressure 24 L/min  Mean Airway Pressure 11 cmH20  Plateau Pressure 21 cmH20  Resp Rate Spontaneous 12 br/min  Resp Rate Total 27 br/min  Exhaled Vt 528 mL  Measured Ve 12.7 mL  HOB> 30 Degrees Y  Adult Ventilator Alarms  Alarms On Y  Patient placed back on vent due to increase WOB and tachypnea.

## 2013-04-15 NOTE — Progress Notes (Signed)
Subjective:  Patient had few seconds episode of sinus bradycardia while changing the inner cannula.  No further episodes of significant bradycardia  Objective:  Vital Signs in the last 24 hours: Temp:  [97.9 F (36.6 C)-99.6 F (37.6 C)] 98 F (36.7 C) (02/20 1116) Pulse Rate:  [64-116] 110 (02/20 1103) Resp:  [11-27] 27 (02/20 1103) BP: (138-176)/(48-125) 166/59 mmHg (02/20 1100) SpO2:  [98 %-100 %] 99 % (02/20 1103) FiO2 (%):  [30 %-40 %] 40 % (02/20 1103) Weight:  [79.2 kg (174 lb 9.7 oz)] 79.2 kg (174 lb 9.7 oz) (02/20 0500)  Intake/Output from previous day: 02/19 0701 - 02/20 0700 In: 2540 [I.V.:480; NG/GT:2000] Out: 1160 [Urine:1160] Intake/Output from this shift: Total I/O In: 540 [I.V.:80; Other:60; NG/GT:400] Out: 395 [Urine:395]  Physical Exam: Neck: no adenopathy, no carotid bruit, no JVD and supple, symmetrical, trachea midline Lungs: decreased breath sounds at bases with occasional rhonchi Heart: regular rate and rhythm, S1, S2 normal and soft systolic murmur noted Abdomen: soft, non-tender; bowel sounds normal; no masses,  no organomegaly Extremities: no clubbing cyanosis 1+ edema  Lab Results:  Recent Labs  04/13/13 0415  WBC 10.7*  HGB 7.2*  PLT 300    Recent Labs  04/13/13 0415 04/14/13 0430  NA 146 141  K 3.3* 3.9  CL 108 106  CO2 30 27  GLUCOSE 110* 156*  BUN 17 16  CREATININE 0.72 0.67   No results found for this basename: TROPONINI, CK, MB,  in the last 72 hours Hepatic Function Panel No results found for this basename: PROT, ALBUMIN, AST, ALT, ALKPHOS, BILITOT, BILIDIR, IBILI,  in the last 72 hours No results found for this basename: CHOL,  in the last 72 hours No results found for this basename: PROTIME,  in the last 72 hours  Imaging: Imaging results have been reviewed and No results found.  Cardiac Studies:  Assessment/Plan:  Status post sinus bradycardia in the setting of mucous plug/hypoxia/increased vagal tone  Status post  expiratory laparotomy and resection of necrotic small bowel for strangulated small bowel  Mild coronary artery disease  Hypertension  GERD  Hypercholesteremia  Protein calorie malnutrition  Chronic respiratory failure/atelectasis  Post op anemia Mild volume overload Plan Agree with low-dose Lasix Check labs in a.m.  LOS: 24 days    Kristi Alexander N 04/15/2013, 12:14 PM

## 2013-04-15 NOTE — Progress Notes (Signed)
Physical Therapy Treatment Patient Details Name: Kristi Alexander MRN: [id number] DOB: 11-09-1925 Today's Date: 04/15/2013 Time: 1027-2536 PT Time Calculation (min): 14 min  PT Assessment / Plan / Recommendation  History of Present Illness Pt s/p exp. lap and resection of 4 foot section of necrotic bowel.  pt back to room intubated.  Extubated 1/29.  Trached 2/6 due to pt having trouble protecting airway. Using ventilator at night and weaning (on trach collar or vent) during the day.   PT Comments   Pt more fatigued today per RN and RT. Has done well on trach collar. Pt agreed to OOB to chair, however was unable to ambulate due to fatigue, incr RR, and incr HR. Required less physical assist this date.    Follow Up Recommendations  SNF     Does the patient have the potential to tolerate intense rehabilitation     Barriers to Discharge        Equipment Recommendations  Other (comment) (TBA)    Recommendations for Other Services    Frequency Min 3X/week   Progress towards PT Goals Progress towards PT goals: Progressing toward goals  Plan Current plan remains appropriate    Precautions / Restrictions Precautions Precautions: Fall   Pertinent Vitals/Pain SaO2 100% 40% face mask; dyspnea 3/4 HR 115-140 (briefly, and then quickly back down to 125)    Mobility  Bed Mobility Overal bed mobility: Needs Assistance Bed Mobility: Rolling;Sidelying to Sit Rolling: Min assist Sidelying to sit: Min assist;HOB elevated General bed mobility comments: incr time due to incr dyspnea Transfers Overall transfer level: Needs assistance Equipment used: 2 person hand held assist Transfers: Sit to/from [facility] Sit to Stand: Min assist Stand pivot transfers: +2 physical assistance;Min assist General transfer comment: steady assist for balance; cues for slowing breathing Ambulation/Gait General Gait Details: pt states very fatigued from being on trach collar most of today. Did  not want to try to walk; dyspnea 3/4    Exercises     PT Diagnosis:    PT Problem List:   PT Treatment Interventions:     PT Goals (current goals can now be found in the care plan section) Acute Rehab PT Goals Patient Stated Goal: 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 lap and resection of 4 foot section of necrotic bowel.  pt back to room intubated.  Extubated 1/29.  Trached 2/6 due to pt having trouble protecting airway. Using ventilator at night and weaning (on trach collar or vent) during the day.    Subjective Data  Subjective: Too tired to walk Patient Stated Goal: 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   Cognition  Cognition Arousal/Alertness: Awake/alert Behavior During Therapy: WFL for tasks assessed/performed Overall Cognitive Status: Within Functional Limits for tasks assessed    Balance  Balance Sitting balance-Leahy Scale: Fair Standing balance-Leahy Scale: Fair  End of Session PT - End of Session Equipment Utilized During Treatment: Oxygen (40% trach collar) Activity Tolerance: Patient limited by fatigue Patient left: in chair;with call bell/phone within reach   GP     Brantly Kalman 04/15/2013, 4:36 PM Pager [phone]

## 2013-04-15 NOTE — Progress Notes (Signed)
Appreciate card input Currently on trach collar - yeah! Rest on vent as needed abd soft, nt, nd. g tube RUE edema - not new; but now has some b/l LE 1+ pitting edema.   Will check CVP. May need a touch of lasix.   Leighton Ruff. Redmond Pulling, MD, FACS General, Bariatric, & Minimally Invasive Surgery [facility], [place]

## 2013-04-15 NOTE — Progress Notes (Signed)
Pt's HR up to 170s-190s for approximately 30 seconds then returned to ST 120s.  Pt resting comfortably in chair.  BP 143/54.  [place] notified.  No new orders received.  Will continue to monitor.  Vista Lawman, RN

## 2013-04-15 NOTE — Progress Notes (Signed)
NUTRITION FOLLOW UP  Intervention:   Continue current TF regimen RD to follow for nutrition care plan  Nutrition Dx:   Inadequate oral intake related to inability to eat as evidenced by NPO status, ongoing  Goal:   Pt to meet >/= 90% of their estimated nutrition needs, met  Monitor:   TF regimen & tolerance, PO diet advancement, respiratory status, weight, labs, I/O's  Assessment:   80 year old female admitted with SBO s/p ex lap and bowel resection on 1/28. She is to on TPN for prolonged ileus. Intubated 2/1 for acute metabolic acidosis. CT 2/1: expected post po changes. Failed extubated on 2/5.   Patient s/p procedures 2/6:  PERCUTANEOUS ENDOSCOPIC GASTROSTOMY TUBE  Patient with increasing dependence on vent. Attempting to wean from vent prior to discharge to SNF.  Patient is currently intubated on ventilator support -- trach MV: 8.1 L/min Temp (24hrs), Avg:98.8 F (37.1 C), Min:97.9 F (36.6 C), Max:99.6 F (37.6 C)  Vital AF 1.2 formula continues to infuse at 50 ml/hr via PEG tube providing 1440 kcal, 90 grams of protein and 973 ml of free water. Free water flushes at 200 ml every 4 hours.   Per conversation with nurse, patient tolerating TF but still having some loose stools.   Height: Ht Readings from Last 1 Encounters:  03/23/13 5' 2"  (1.575 m)    Weight Status:   Wt Readings from Last 1 Encounters:  04/15/13 174 lb 9.7 oz (79.2 kg)  admit weight:  141 lb (64 kg) - weight rending up due to fluid status; patient net + 31 L since admission  Re-estimated needs:  Kcal: 1315 Protein: 80-90 grams  Fluid: > 1.5 L  Skin: incision-abdomen; skin tear-lip  Diet Order:  NPO   Intake/Output Summary (Last 24 hours) at 04/15/13 0936 Last data filed at 04/15/13 0806  Gross per 24 hour  Intake   2500 ml  Output   1000 ml  Net   1500 ml    Last BM: 2/19-loose   Labs:   Recent Labs Lab 04/12/13 0445 04/13/13 0415 04/14/13 0430  NA 145 146 141  K 3.8 3.3*  3.9  CL 107 108 106  CO2 28 30 27   BUN 15 17 16   CREATININE 0.72 0.72 0.67  CALCIUM 8.6 8.3* 8.0*  GLUCOSE 113* 110* 156*    CBG (last 3)   Recent Labs  04/14/13 2313 04/15/13 0347 04/15/13 0846  GLUCAP 118* 114* 125*    Scheduled Meds: . chlorhexidine  15 mL Mouth/Throat BID  . enoxaparin (LOVENOX) injection  30 mg Subcutaneous Daily  . famotidine  20 mg Per Tube QHS  . free water  200 mL Per Tube Q6H  . insulin aspart  0-9 Units Subcutaneous 6 times per day  . insulin glargine  5 Units Subcutaneous Daily  . sodium chloride  10-40 mL Intracatheter Q12H    Continuous Infusions: . sodium chloride 20 mL/hr (04/14/13 1849)  . feeding supplement (VITAL AF 1.2 CAL) 1,000 mL (04/14/13 1743)    Claudell Kyle, Dietetic Intern Pager: [phone]

## 2013-04-15 NOTE — Progress Notes (Signed)
Pt vomited moderate amount of undigested tube feeding.  Pt denies abdominal pain or nausea.  Residual 30cc.  Dr. Lake Bells notified, orders received to continue tube feeding.  Will continue to monitor.  Vista Lawman, RN

## 2013-04-15 NOTE — Progress Notes (Signed)
CSW e-mailed vent SNFs [facility] in [place], [place] in [place], [place], and [place] in [place] asking them to keep pt's clinicals and let CSW know if they would be able to take pt clinically. If a bed is not available at their facilities, CSW asked them to add pt to their waiting list in the event discharge plan changes to vent SNF and we need to find her placement.   Ky Barban, MSW, [facility] Clinical Social Worker [phone]

## 2013-04-15 NOTE — Progress Notes (Signed)
Patient ID: Kristi Alexander, female   DOB: [birth year], 80 y.o.   MRN: [id number] 14 Days Post-Op  Subjective: Pt continues to have brady episodes any time she has issues with her trach.  Otherwise states she is very tired this morning.  Looks more tired than I've seen her look  Objective: Vital signs in last 24 hours: Temp:  [98.1 F (36.7 C)-99.6 F (37.6 C)] 98.1 F (36.7 C) (02/20 0403) Pulse Rate:  [64-107] 93 (02/20 0747) Resp:  [15-26] 26 (02/20 0747) BP: (117-176)/(48-125) 155/49 mmHg (02/20 0747) SpO2:  [98 %-100 %] 100 % (02/20 0747) FiO2 (%):  [30 %-40 %] 40 % (02/20 0747) Weight:  [174 lb 9.7 oz (79.2 kg)] 174 lb 9.7 oz (79.2 kg) (02/20 0500) Last BM Date: 04/14/13  Intake/Output from previous day: 02/19 0701 - 02/20 0700 In: 2540 [I.V.:480; NG/GT:2000] Out: 1100 [Urine:1100] Intake/Output this shift: Total I/O In: 130 [I.V.:20; Other:60; NG/GT:50] Out: -   PE: Abd: soft, NT, ND, +BS, PEG in place  Lab Results:   Recent Labs  04/13/13 0415  WBC 10.7*  HGB 7.2*  HCT 22.6*  PLT 300   BMET  Recent Labs  04/13/13 0415 04/14/13 0430  NA 146 141  K 3.3* 3.9  CL 108 106  CO2 30 27  GLUCOSE 110* 156*  BUN 17 16  CREATININE 0.72 0.67  CALCIUM 8.3* 8.0*   PT/INR No results found for this basename: LABPROT, INR,  in the last 72 hours CMP     Component Value Date/Time   NA 141 04/14/2013 0430   K 3.9 04/14/2013 0430   CL 106 04/14/2013 0430   CO2 27 04/14/2013 0430   GLUCOSE 156* 04/14/2013 0430   BUN 16 04/14/2013 0430   CREATININE 0.67 04/14/2013 0430   CALCIUM 8.0* 04/14/2013 0430   PROT 4.9* 03/31/2013 0405   ALBUMIN 1.7* 03/31/2013 0405   AST 19 03/31/2013 0405   ALT 14 03/31/2013 0405   ALKPHOS 75 03/31/2013 0405   BILITOT 0.4 03/31/2013 0405   GFRNONAA 77* 04/14/2013 0430   GFRAA 90* 04/14/2013 0430   Lipase  No results found for this basename: lipase       Studies/Results: No results found.  Anti-infectives: Anti-infectives   Start      Dose/Rate Route Frequency Ordered Stop   03/24/13 0800  ciprofloxacin (CIPRO) IVPB 400 mg  Status:  Discontinued     400 mg 200 mL/hr over 60 Minutes Intravenous Every 24 hours 03/23/13 1241 04/02/13 0839   03/23/13 1700  metroNIDAZOLE (FLAGYL) IVPB 500 mg  Status:  Discontinued     500 mg 100 mL/hr over 60 Minutes Intravenous Every 8 hours 03/23/13 1236 04/02/13 0839   03/23/13 0730  [MAR Hold]  ciprofloxacin (CIPRO) IVPB 400 mg     (On MAR Hold since 03/23/13 0805)   400 mg 200 mL/hr over 60 Minutes Intravenous On call to O.R. 03/23/13 0350 03/23/13 0824   03/23/13 0730  [MAR Hold]  metroNIDAZOLE (FLAGYL) IVPB 500 mg     (On MAR Hold since 03/23/13 0805)   500 mg 100 mL/hr over 60 Minutes Intravenous On call to O.R. 03/23/13 0938 03/23/13 0910       Assessment/Plan  SBO S/P SBR POD#23  -tolerating TF  -failed swallow evaluation 2/16  -activity as tolerated  -add pain medication per tube  Acute respiratory failure-s/p trach. Patient becoming more and more vent dependent as time goes on. Episodes of bradycardia continue with pulmonary issues, mostly at  night  Intermittent Bradycardia - appreciate cardiology evaluation and assistance Sepsis 2/2 peritonitis, resolved  Anemia of critical illness -stable  AKI-resolved  S/P PEG, getting TFs at goal rate. Diarrhea resolved, Imodium to PRN  ID - off ABX  Deconditioned - PT/OT, up to chair. Continue foley until this improves.  HTN-remains elevated. Appreciate CCM assistance.  PCM- TF via PEG DM-CBGs, lantus and novolog  VTE - SCDs, lovenox  Dispo - will d/w MD about transfer to SDU    LOS: 24 days    Akio Hudnall E 04/15/2013, 8:23 AM Pager: [phone]

## 2013-04-15 NOTE — Progress Notes (Signed)
RT Note: Heated trach collar at 5 temp was 82 degrees. Temp turned up to 6. RT will monitor.

## 2013-04-16 ENCOUNTER — Inpatient Hospital Stay (HOSPITAL_COMMUNITY): Payer: Medicare Other

## 2013-04-16 DIAGNOSIS — J9620 Acute and chronic respiratory failure, unspecified whether with hypoxia or hypercapnia: Secondary | ICD-10-CM

## 2013-04-16 LAB — CBC
HEMATOCRIT: 23.6 % — AB (ref 36.0–46.0)
Hemoglobin: 7.1 g/dL — ABNORMAL LOW (ref 12.0–15.0)
MCH: 24.9 pg — AB (ref 26.0–34.0)
MCHC: 30.1 g/dL (ref 30.0–36.0)
MCV: 82.8 fL (ref 78.0–100.0)
Platelets: 280 10*3/uL (ref 150–400)
RBC: 2.85 MIL/uL — AB (ref 3.87–5.11)
RDW: 21.7 % — ABNORMAL HIGH (ref 11.5–15.5)
WBC: 8.9 10*3/uL (ref 4.0–10.5)

## 2013-04-16 LAB — COMPREHENSIVE METABOLIC PANEL
ALBUMIN: 1.5 g/dL — AB (ref 3.5–5.2)
ALT: 21 U/L (ref 0–35)
AST: 22 U/L (ref 0–37)
Alkaline Phosphatase: 140 U/L — ABNORMAL HIGH (ref 39–117)
BUN: 14 mg/dL (ref 6–23)
CALCIUM: 8.3 mg/dL — AB (ref 8.4–10.5)
CO2: 29 meq/L (ref 19–32)
CREATININE: 0.64 mg/dL (ref 0.50–1.10)
Chloride: 102 mEq/L (ref 96–112)
GFR calc Af Amer: >90 mL/min (ref >90)
GFR calc non Af Amer: 79 mL/min — ABNORMAL LOW (ref >90)
Glucose, Bld: 132 mg/dL — ABNORMAL HIGH (ref 70–99)
Potassium: 3.7 mEq/L (ref 3.7–5.3)
Sodium: 140 mEq/L (ref 137–147)
TOTAL PROTEIN: 5.3 g/dL — AB (ref 6.0–8.3)
Total Bilirubin: 0.4 mg/dL (ref 0.3–1.2)

## 2013-04-16 LAB — GLUCOSE, CAPILLARY
GLUCOSE-CAPILLARY: 121 mg/dL — AB (ref 70–99)
Glucose-Capillary: 120 mg/dL — ABNORMAL HIGH (ref 70–99)
Glucose-Capillary: 113 mg/dL — ABNORMAL HIGH (ref 70–99)
Glucose-Capillary: 120 mg/dL — ABNORMAL HIGH (ref 70–99)
Glucose-Capillary: 145 mg/dL — ABNORMAL HIGH (ref 70–99)
Glucose-Capillary: 134 mg/dL — ABNORMAL HIGH (ref 70–99)

## 2013-04-16 MED ORDER — POTASSIUM CHLORIDE 20 MEQ/15ML (10%) PO LIQD
40.0000 meq | Freq: Once | ORAL | Status: AC
  Administered 2013-04-16: 40 meq
  Filled 2013-04-16: qty 30

## 2013-04-16 MED ORDER — FUROSEMIDE 10 MG/ML IJ SOLN
20.0000 mg | Freq: Once | INTRAMUSCULAR | Status: AC
  Administered 2013-04-16: 20 mg via INTRAVENOUS
  Filled 2013-04-16: qty 2

## 2013-04-16 NOTE — Progress Notes (Signed)
This note also relates to the following rows which could not be included: MAP (mmHg) - Cannot attach notes to unvalidated device data ECG Heart Rate - Cannot attach notes to unvalidated device data    04/16/13 1815  Vitals  BP ! 158/76 mmHg  Patient Position, if appropriate Sitting  Pulse Rate ! 110  Resp ! 30  Oxygen Therapy  SpO2 98 %  O2 Device Ventilator  FiO2 (%) 30 %  Patient tired. RR 30-45, SBP .200. Called RT to place patient back on ventilator. Patient began coughing hard and vomited approximately 50 ml. Given zofran 4mg  IV.

## 2013-04-16 NOTE — Progress Notes (Signed)
04/16/13 1235  Vitals  BP ! 134/118 mmHg  MAP (mmHg) 121  Pulse Rate 86  ECG Heart Rate 97  Resp ! 21  Oxygen Therapy  SpO2 93 %  Patient up in chair. HR dropped into 30's with CHB x 12 seconds. Patient asymptomatic and was not having breathing difficulties and trach was not plugged. Heart rate returned to SR with pacs and occ pvc in the 80's.

## 2013-04-16 NOTE — Progress Notes (Signed)
04/16/13 1800  Vitals  BP ! 205/81 mmHg  MAP (mmHg) 108  Pulse Rate ! 118  ECG Heart Rate ! 114  Resp ! 21  Oxygen Therapy  SpO2 94 %  Hydralazine 10 mg IV given for SBP 205/81. Was also given xanax 0.25 mg earlier for nervousness. Called respiratory therapy to place patient back on ventilator.

## 2013-04-16 NOTE — Progress Notes (Signed)
15 Days Post-Op  Subjective: AWAKE ON TC. Appears to be in good spirits.   Objective: Vital signs in last 24 hours: Temp:  [98 F (36.7 C)-99.3 F (37.4 C)] 98.4 F (36.9 C) (02/21 0711) Pulse Rate:  [85-127] 97 (02/21 0900) Resp:  [16-42] 19 (02/21 0900) BP: (109-180)/(51-113) 166/66 mmHg (02/21 0900) SpO2:  [97 %-100 %] 100 % (02/21 0900) FiO2 (%):  [8 %-40 %] 8 % (02/21 0848) Weight:  [171 lb 15.3 oz (78 kg)] 171 lb 15.3 oz (78 kg) (02/21 0500) Last BM Date: 04/15/13  Intake/Output from previous day: 02/20 0701 - 02/21 0700 In: 2340 [I.V.:480; NG/GT:1800] Out: 3480 [Urine:3480] Intake/Output this shift: Total I/O In: 140 [I.V.:40; NG/GT:100] Out: 130 [Urine:130]  Incision/Wound:wound closed intact except for inferior portion packed open.  Soft non tender or distended. PEG site ok.   Lab Results:   Recent Labs  04/16/13 0416  WBC 8.9  HGB 7.1*  HCT 23.6*  PLT 280   BMET  Recent Labs  04/14/13 0430 04/16/13 0416  NA 141 140  K 3.9 3.7  CL 106 102  CO2 27 29  GLUCOSE 156* 132*  BUN 16 14  CREATININE 0.67 0.64  CALCIUM 8.0* 8.3*   PT/INR No results found for this basename: LABPROT, INR,  in the last 72 hours ABG No results found for this basename: PHART, PCO2, PO2, HCO3,  in the last 72 hours  Studies/Results: Dg Chest Port 1 View  04/16/2013   CLINICAL DATA:  Respiratory failure.  EXAM: PORTABLE CHEST - 1 VIEW  COMPARISON:  04/12/2013  FINDINGS: Tracheostomy tube overlies the airway. Right PICC remains in place with tip overlying the mid SVC. The cardiac silhouette remains enlarged and partially obscured. Lungs remain hypoinflated with similar appearance of bibasilar opacities. Mild degenerative changes are present in the thoracic spine.  IMPRESSION: Bibasilar lung opacities without significant interval change.   Electronically Signed   By: Logan Bores   On: 04/16/2013 08:24    Anti-infectives: Anti-infectives   Start     Dose/Rate Route Frequency  Ordered Stop   03/24/13 0800  ciprofloxacin (CIPRO) IVPB 400 mg  Status:  Discontinued     400 mg 200 mL/hr over 60 Minutes Intravenous Every 24 hours 03/23/13 1241 04/02/13 0839   03/23/13 1700  metroNIDAZOLE (FLAGYL) IVPB 500 mg  Status:  Discontinued     500 mg 100 mL/hr over 60 Minutes Intravenous Every 8 hours 03/23/13 1236 04/02/13 0839   03/23/13 0730  [MAR Hold]  ciprofloxacin (CIPRO) IVPB 400 mg     (On MAR Hold since 03/23/13 0805)   400 mg 200 mL/hr over 60 Minutes Intravenous On call to O.R. 03/23/13 0728 03/23/13 0824   03/23/13 0730  [MAR Hold]  metroNIDAZOLE (FLAGYL) IVPB 500 mg     (On MAR Hold since 03/23/13 0805)   500 mg 100 mL/hr over 60 Minutes Intravenous On call to O.R. 03/23/13 5009 03/23/13 0910      Assessment/Plan: s/p Procedure(s): ESOPHAGOGASTRODUODENOSCOPY (EGD) (N/A) PERCUTANEOUS ENDOSCOPIC GASTROSTOMY (PEG) PLACEMENT (N/A) LOOKS OK.  AWAITING FUTURE PLACEMENT.     LOS: 25 days    Kristi Alexander A. 04/16/2013

## 2013-04-16 NOTE — Progress Notes (Signed)
Subjective:  Up  in chair more alert and awake today. No further episodes of bradycardia  Objective:  Vital Signs in the last 24 hours: Temp:  [98 F (36.7 C)-99.3 F (37.4 C)] 99 F (37.2 C) (02/21 1144) Pulse Rate:  [84-127] 84 (02/21 1000) Resp:  [14-42] 14 (02/21 1000) BP: (109-180)/(51-113) 166/58 mmHg (02/21 1000) SpO2:  [97 %-100 %] 100 % (02/21 1000) FiO2 (%):  [8 %-40 %] 8 % (02/21 0848) Weight:  [78 kg (171 lb 15.3 oz)] 78 kg (171 lb 15.3 oz) (02/21 0500)  Intake/Output from previous day: 02/20 0701 - 02/21 0700 In: 2340 [I.V.:480; NG/GT:1800] Out: 3480 [Urine:3480] Intake/Output from this shift: Total I/O In: 390 [I.V.:110; Other:80; NG/GT:200] Out: 450 [Urine:450]  Physical Exam: Neck: no adenopathy, no carotid bruit, no JVD and supple, symmetrical, trachea midline Lungs: Decrease vessel at bases with coarse rhonchi Heart: regular rate and rhythm, S1, S2 normal and Soft systolic murmur noted Abdomen: soft, non-tender; bowel sounds normal; no masses,  no organomegaly Extremities: No clubbing cyanosis trace edema noted  Lab Results:  Recent Labs  04/16/13 0416  WBC 8.9  HGB 7.1*  PLT 280    Recent Labs  04/14/13 0430 04/16/13 0416  NA 141 140  K 3.9 3.7  CL 106 102  CO2 27 29  GLUCOSE 156* 132*  BUN 16 14  CREATININE 0.67 0.64   No results found for this basename: TROPONINI, CK, MB,  in the last 72 hours Hepatic Function Panel  Recent Labs  04/16/13 0416  PROT 5.3*  ALBUMIN 1.5*  AST 22  ALT 21  ALKPHOS 140*  BILITOT 0.4   No results found for this basename: CHOL,  in the last 72 hours No results found for this basename: PROTIME,  in the last 72 hours  Imaging: Imaging results have been reviewed and Dg Chest Port 1 View  04/16/2013   CLINICAL DATA:  Respiratory failure.  EXAM: PORTABLE CHEST - 1 VIEW  COMPARISON:  04/12/2013  FINDINGS: Tracheostomy tube overlies the airway. Right PICC remains in place with tip overlying the mid SVC.  The cardiac silhouette remains enlarged and partially obscured. Lungs remain hypoinflated with similar appearance of bibasilar opacities. Mild degenerative changes are present in the thoracic spine.  IMPRESSION: Bibasilar lung opacities without significant interval change.   Electronically Signed   By: Logan Bores   On: 04/16/2013 08:24    Cardiac Studies:  Assessment/Plan:  Status post sinus bradycardia in the setting of mucous plug/hypoxia/increased vagal tone  Status post expiratory laparotomy and resection of necrotic small bowel for strangulated small bowel  Mild coronary artery disease  Hypertension  GERD  Hypercholesteremia  Protein calorie malnutrition  Chronic respiratory failure/atelectasis  Post op anemia  Mild volume overload  Plan Continue present management I will sign off please call if needed  LOS: 25 days    Samanthia Howland N 04/16/2013, 11:54 AM

## 2013-04-16 NOTE — Progress Notes (Signed)
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  Initially managed conservatively but cont to have worsening abd pain and increased WBC and was taken to OR 1/28 for exp lap and bowel resection.  Remained on vent post op and PCCM consulted.   SIGNIFICANT EVENTS / STUDIES:  1/27 admitted with SBO, conservative management  1/28 necrotic bowel suspected, to OR, 3-4 feet frankly necrotic bowel removed (no perforation)  2/01 Re-intubated; CT abd: possible adhesions causing obstruction of SB, colon thickening ?infection or ischemia 2/08 On vent overnight ? Mucus plug 2/09 Off vent 2/10 on vent overnight >> mucus plug, transfuse 1 unit PRBC 2/11 on vent overnight for being worn out 2/12 unable to sleep off vent in early hours of AM, working too hard, placed back on vent 2/12 CT abd/pelvis >> SB ileus, ? Colitis transverse colon 2/16-2/18 unable to perform ATC. Weaned on PSV. Ambulating with assistance (on vent!) 2/19 comfortable on ATC  LINES / TUBES: ETT 1/28>>>1/29; 2/1>>> 2/5, 2/5 >>2/6 L brachial aline 1/28>>> out Trach 2/6 >>  RUE PICC 2/1 >> G tube 2/6 >>   CULTURES: Urine 1/28>>> negative C.Diff >>> Neg   ANTIBIOTICS: Flagyl 1/28>>>2/7 Cipro 1/29 >> 2/7  SUBJECTIVE:   04/16/13: No overnight issues. 300cc vomit yesterday related to cough. Tolerating tube feeds. Smiling and talking through trach; she is the wife my office patient   VITAL SIGNS: Temp:  [97.9 F (36.6 C)-99.3 F (37.4 C)] 98.4 F (36.9 C) (02/21 0711) Pulse Rate:  [85-127] 85 (02/21 0700) Resp:  [11-42] 19 (02/21 0700) BP: (109-180)/(51-113) 168/64 mmHg (02/21 0700) SpO2:  [97 %-100 %] 100 % (02/21 0700) FiO2 (%):  [30  %-40 %] 30 % (02/21 0700) Weight:  [78 kg (171 lb 15.3 oz)] 78 kg (171 lb 15.3 oz) (02/21 0500)   INTAKE / OUTPUT: Intake/Output     02/20 0701 - 02/21 0700 02/21 0701 - 02/22 0700   I.V. (mL/kg) 480 (6.2)    Other 60    NG/GT 1800    Total Intake(mL/kg) 2340 (30)    Urine (mL/kg/hr) 3480 (1.9)    Total Output 3480     Net -1140          Stool Occurrence 2 x      PHYSICAL EXAMINATION: General: NAD Neuro: No focal deficits HEENT: trach site clean Cardiovascular: RRR s M Lungs: coarse breath sounds Abdomen: non tender, +BS Ext: trace edema   PULMONARY No results found for this basename: PHART, PCO2, PCO2ART, PO2, PO2ART, HCO3, TCO2, O2SAT,  in the last 168 hours  CBC  Recent Labs Lab 04/12/13 0445 04/13/13 0415 04/16/13 0416  HGB 7.6* 7.2* 7.1*  HCT 23.7* 22.6* 23.6*  WBC 11.9* 10.7* 8.9  PLT 298 300 280    COAGULATION No results found for this basename: INR,  in the last 168 hours  CARDIAC  No results found for this basename: TROPONINI,  in the last 168 hours No results found for this basename: PROBNP,  in the last 168 hours   CHEMISTRY  Recent Labs Lab 04/11/13 0400 04/12/13 0445 04/13/13 0415 04/14/13 0430 04/16/13 0416  NA 142 145 146 141 140  K 3.6* 3.8  3.3* 3.9 3.7  CL 107 107 108 106 102  CO2 27 28 30 27 29   GLUCOSE 126* 113* 110* 156* 132*  BUN 15 15 17 16 14   CREATININE 0.73 0.72 0.72 0.67 0.64  CALCIUM 8.0* 8.6 8.3* 8.0* 8.3*   Estimated Creatinine Clearance: 48.8 ml/min (by C-G formula based on Cr of 0.64).   LIVER  Recent Labs Lab 04/16/13 0416  AST 22  ALT 21  ALKPHOS 140*  BILITOT 0.4  PROT 5.3*  ALBUMIN 1.5*     INFECTIOUS No results found for this basename: LATICACIDVEN, PROCALCITON,  in the last 168 hours   ENDOCRINE CBG (last 3)   Recent Labs  04/15/13 2340 04/16/13 0401 04/16/13 0709  GLUCAP 121* 120* 113*         IMAGING x48h  Dg Chest Port 1 View  04/16/2013   CLINICAL DATA:  Respiratory  failure.  EXAM: PORTABLE CHEST - 1 VIEW  COMPARISON:  04/12/2013  FINDINGS: Tracheostomy tube overlies the airway. Right PICC remains in place with tip overlying the mid SVC. The cardiac silhouette remains enlarged and partially obscured. Lungs remain hypoinflated with similar appearance of bibasilar opacities. Mild degenerative changes are present in the thoracic spine.  IMPRESSION: Bibasilar lung opacities without significant interval change.   Electronically Signed   By: Logan Bores   On: 04/16/2013 08:24      ASSESSMENT / PLAN: PULMONARY A: Acute respiratory failure with chronic resp failure, prolonged vent dependence Trach tube dependence Small Pleural effusions Intermittent mucus plugging  2./21/15: ATC all day yesterday. 30L + since admit (4L since x 7 days)  P:   Cont vent wean on ATC as tolerated Follow CXR intermittently Lasix 20mg  x 1  CARDIOVASCULAR A: Septic shock, resolved Hx of HTN. P:  Cont current Rx  RENAL A: Acute kidney injury, resolved. Non-anion gap metabolic acidosis, resolved. Mild hypervolemia Hypernatremia, resolved  04/16/13: Volume overload +  P:   Monitor BMET intermittently Monitor I/Os Correct electrolytes as indicated Dc free water didurese    GASTROINTESTINAL A: SBO from ischemic bowel. S/P laparotomy Protein calorie malnutrition. Diarrhea (short gut ). Colitis, SB ileus on CT abd 2/12.  04/16/13: Tolerateing TF  P:   Post-op care and per CCS   HEMATOLOGIC A: Anemia of critical illness; no active bleed P:  transfuse for Hb < 7; conserve phlebotomies after blood draw 04/17/13 lovenox for DVT prevention  INFECTIOUS A: Severe sepsis, resolved. Leukocytosis - resolved P:   Micro and abx as above  ENDOCRINE A: Hyperglycemia, mild No prior hx of DM P:   DC Lantus 2/20 Cont SSI for now, consider DC if CBGs remain low off Lantus  NEUROLOGIC A: Post op pain. Deconditioning: has walked with vent with PT in  ICU Anxiety  04/16/13: Pleasant by deconditioned  P:   PRN fentanyl. PT/OT as able. Cont PRN alprazolam Transfer to vent SDU ordered 2/19 Ultimately will need rehab in CIR or SNF   GLOBAL 04/16/13: awaiting ICU bed. Family not at bedside   Dr. Brand Males, M.D., [facility].C.P Pulmonary and Critical Care Medicine Staff Physician [facility] Pager: [phone], If no answer or between  15:00h - 7:00h: call [phone]  04/16/2013 8:37 AM

## 2013-04-17 ENCOUNTER — Inpatient Hospital Stay (HOSPITAL_COMMUNITY): Payer: Medicare Other

## 2013-04-17 LAB — URINE MICROSCOPIC-ADD ON

## 2013-04-17 LAB — CBC
HEMATOCRIT: 21.5 % — AB (ref 36.0–46.0)
Hemoglobin: 6.7 g/dL — CL (ref 12.0–15.0)
MCH: 26.4 pg (ref 26.0–34.0)
MCHC: 31.2 g/dL (ref 30.0–36.0)
MCV: 84.6 fL (ref 78.0–100.0)
Platelets: 282 10*3/uL (ref 150–400)
RBC: 2.54 MIL/uL — ABNORMAL LOW (ref 3.87–5.11)
RDW: 21.9 % — ABNORMAL HIGH (ref 11.5–15.5)
WBC: 8.1 10*3/uL (ref 4.0–10.5)

## 2013-04-17 LAB — URINALYSIS, ROUTINE W REFLEX MICROSCOPIC
Bilirubin Urine: NEGATIVE
Glucose, UA: NEGATIVE mg/dL
KETONES UR: NEGATIVE mg/dL
NITRITE: POSITIVE — AB
PH: 5.5 (ref 5.0–8.0)
Protein, ur: 30 mg/dL — AB
Specific Gravity, Urine: 1.023 (ref 1.005–1.030)
UROBILINOGEN UA: 1 mg/dL (ref 0.0–1.0)

## 2013-04-17 LAB — GLUCOSE, CAPILLARY
GLUCOSE-CAPILLARY: 136 mg/dL — AB (ref 70–99)
GLUCOSE-CAPILLARY: 132 mg/dL — AB (ref 70–99)
GLUCOSE-CAPILLARY: 116 mg/dL — AB (ref 70–99)
Glucose-Capillary: 122 mg/dL — ABNORMAL HIGH (ref 70–99)
Glucose-Capillary: 129 mg/dL — ABNORMAL HIGH (ref 70–99)
Glucose-Capillary: 145 mg/dL — ABNORMAL HIGH (ref 70–99)

## 2013-04-17 LAB — BASIC METABOLIC PANEL
BUN: 16 mg/dL (ref 6–23)
CO2: 30 mEq/L (ref 19–32)
Calcium: 8.5 mg/dL (ref 8.4–10.5)
Chloride: 106 mEq/L (ref 96–112)
Creatinine, Ser: 0.74 mg/dL (ref 0.50–1.10)
GFR calc Af Amer: 87 mL/min — ABNORMAL LOW (ref >90)
GFR, EST NON AFRICAN AMERICAN: 75 mL/min — AB (ref >90)
Glucose, Bld: 150 mg/dL — ABNORMAL HIGH (ref 70–99)
Potassium: 4.1 mEq/L (ref 3.7–5.3)
Sodium: 142 mEq/L (ref 137–147)

## 2013-04-17 LAB — PHOSPHORUS: Phosphorus: 2.5 mg/dL (ref 2.3–4.6)

## 2013-04-17 LAB — PRO B NATRIURETIC PEPTIDE: Pro B Natriuretic peptide (BNP): 4415 pg/mL — ABNORMAL HIGH (ref 0–450)

## 2013-04-17 LAB — MAGNESIUM: Magnesium: 1.3 mg/dL — ABNORMAL LOW (ref 1.5–2.5)

## 2013-04-17 LAB — PREPARE RBC (CROSSMATCH)

## 2013-04-17 MED ORDER — POTASSIUM CHLORIDE 20 MEQ/15ML (10%) PO LIQD
20.0000 meq | Freq: Three times a day (TID) | ORAL | Status: DC
  Administered 2013-04-17 – 2013-04-19 (×8): 20 meq
  Filled 2013-04-17 (×9): qty 15

## 2013-04-17 MED ORDER — ALTEPLASE 2 MG IJ SOLR
2.0000 mg | Freq: Once | INTRAMUSCULAR | Status: AC
  Administered 2013-04-17: 2 mg
  Filled 2013-04-17: qty 2

## 2013-04-17 MED ORDER — ACETAMINOPHEN 160 MG/5ML PO SOLN
650.0000 mg | Freq: Four times a day (QID) | ORAL | Status: DC | PRN
  Administered 2013-04-17: 650 mg
  Filled 2013-04-17: qty 20.3

## 2013-04-17 MED ORDER — MAGNESIUM SULFATE 4000MG/100ML IJ SOLN
4.0000 g | Freq: Once | INTRAMUSCULAR | Status: AC
  Administered 2013-04-17: 4 g via INTRAVENOUS
  Filled 2013-04-17: qty 100

## 2013-04-17 MED ORDER — FUROSEMIDE 10 MG/ML IJ SOLN
40.0000 mg | Freq: Two times a day (BID) | INTRAMUSCULAR | Status: DC
  Administered 2013-04-17: 40 mg via INTRAVENOUS
  Administered 2013-04-17: 20 mg via INTRAVENOUS
  Administered 2013-04-18 (×2): 40 mg via INTRAVENOUS
  Filled 2013-04-17 (×7): qty 4

## 2013-04-17 MED ORDER — FUROSEMIDE 10 MG/ML IJ SOLN
20.0000 mg | Freq: Once | INTRAMUSCULAR | Status: AC
  Administered 2013-04-17: 20 mg via INTRAVENOUS
  Filled 2013-04-17: qty 2

## 2013-04-17 MED ORDER — FUROSEMIDE 10 MG/ML IJ SOLN: 20.0000 mg | Freq: Once | INTRAMUSCULAR | Status: DC

## 2013-04-17 MED ORDER — CIPROFLOXACIN IN D5W 400 MG/200ML IV SOLN
400.0000 mg | Freq: Two times a day (BID) | INTRAVENOUS | Status: DC
  Administered 2013-04-17 – 2013-04-19 (×5): 400 mg via INTRAVENOUS
  Filled 2013-04-17 (×6): qty 200

## 2013-04-17 NOTE — Progress Notes (Addendum)
Name: Kristi Alexander MRN: [id number] DOB: 1925-05-20    ADMISSION DATE:  03/22/2013 CONSULTATION DATE:  1/28  REFERRING MD :  Dr Hulen Skains PRIMARY SERVICE: CCS  CHIEF COMPLAINT:  Vent and medical management   BRIEF PATIENT DESCRIPTION: 80 yo female (wife of Kristi office patient Kristi Alexander for copd) with hx HTN admitted 1/27 with SBO.  Initially managed conservatively but cont to have worsening abd pain and increased WBC and was taken to OR 1/28 for exp lap and bowel resection.  Remained on vent post op and PCCM consulted.   LINES / TUBES: ETT 1/28>>>1/29; 2/1>>> 2/5, 2/5 >>2/6 L brachial aline 1/28>>> out Trach 2/6 >>  RUE PICC 2/1 >> G tube 2/6 >>   CULTURES: Urine 1/28>>> negative C.Diff >>> Neg ............. Urine 2/22 (UA dirty) Blood 2/22 Sputum 2/22    ANTIBIOTICS: Flagyl 1/28>>>2/7 Cipro 1/29 >> 2/7, 2/22 (dirty urine + fever) >>    SIGNIFICANT EVENTS / STUDIES:  1/27 admitted with SBO, conservative management  1/28 necrotic bowel suspected, to OR, 3-4 feet frankly necrotic bowel removed (no perforation)  2/01 Re-intubated; CT abd: possible adhesions causing obstruction of SB, colon thickening ?infection or ischemia 2/08 On vent overnight ? Mucus plug 2/09 Off vent 2/10 on vent overnight >> mucus plug, transfuse 1 unit PRBC 2/11 on vent overnight for being worn out 2/12 unable to sleep off vent in early hours of AM, working too hard, placed back on vent 2/12 CT abd/pelvis >> SB ileus, ? Colitis transverse colon 2/16-2/18 unable to perform ATC. Weaned on PSV. Ambulating with assistance (on vent!) 2/19 comfortable on ATC 04/16/13: No overnight issues. 300cc vomit yesterday related to cough. Tolerating tube feeds. Smiling and talking through trach; she is the wife my office patient   SUBJECTIVE/OVERNIGHT/INTERVAL HX 04/17/13: Febrile today; cultured. STarted on cipro. Got 1 unit PRBC. Had another episode vomit last night. Some episode of sinus bradycardia again; to  HR 20   VITAL SIGNS: Temp:  [98.5 F (36.9 C)-101.1 F (38.4 C)] 99.5 F (37.5 C) (02/22 1015) Pulse Rate:  [80-135] 100 (02/22 1015) Resp:  [15-42] 19 (02/22 1015) BP: (108-205)/(49-135) 146/58 mmHg (02/22 1015) SpO2:  [93 %-100 %] 98 % (02/22 1015) FiO2 (%):  [28 %-30 %] 30 % (02/22 0920) Weight:  [165 lb 5.5 oz (75 kg)] 165 lb 5.5 oz (75 kg) (02/22 0500)   INTAKE / OUTPUT: Intake/Output     02/21 0701 - 02/22 0700 02/22 0701 - 02/23 0700   I.V. (mL/kg) 490 (6.5) 90 (1.2)   Blood  325   Other 160 10   NG/GT 1150 150   IV Piggyback  200   Total Intake(mL/kg) 1800 (24) 775 (10.3)   Urine (mL/kg/hr) 2200 (1.2) 625 (2.1)   Emesis/NG output 50 (0)    Stool 4 (0)    Total Output 2254 625   Net -454 +150          PHYSICAL EXAMINATION: General: deconditioned. A bit more tired looking Neuro: No focal deficits HEENT: trach site clean Cardiovascular: RRR s M Lungs: coarse breath sounds Abdomen: non tender, +BS Ext: ++ edema  PULMONARY No results found for this basename: PHART, PCO2, PCO2ART, PO2, PO2ART, HCO3, TCO2, O2SAT,  in the last 168 hours  CBC  Recent Labs Lab 04/13/13 0415 04/16/13 0416 04/17/13 0415  HGB 7.2* 7.1* 6.7*  HCT 22.6* 23.6* 21.5*  WBC 10.7* 8.9 8.1  PLT 300 280 282    COAGULATION No results found for  this basename: INR,  in the last 168 hours  CARDIAC  No results found for this basename: TROPONINI,  in the last 168 hours  Recent Labs Lab 04/17/13 0415  PROBNP 4415.0*     CHEMISTRY  Recent Labs Lab 04/12/13 0445 04/13/13 0415 04/14/13 0430 04/16/13 0416 04/17/13 0415  NA 145 146 141 140 142  K 3.8 3.3* 3.9 3.7 4.1  CL 107 108 106 102 106  CO2 28 30 27 29 30   GLUCOSE 113* 110* 156* 132* 150*  BUN 15 17 16 14 16   CREATININE 0.72 0.72 0.67 0.64 0.74  CALCIUM 8.6 8.3* 8.0* 8.3* 8.5  MG  --   --   --   --  1.3*  PHOS  --   --   --   --  2.5   Estimated Creatinine Clearance: 47.9 ml/min (by C-G formula based on Cr of  0.74).   LIVER  Recent Labs Lab 04/16/13 0416  AST 22  ALT 21  ALKPHOS 140*  BILITOT 0.4  PROT 5.3*  ALBUMIN 1.5*     INFECTIOUS No results found for this basename: LATICACIDVEN, PROCALCITON,  in the last 168 hours   ENDOCRINE CBG (last 3)   Recent Labs  04/17/13 0003 04/17/13 0353 04/17/13 0714  GLUCAP 122* 129* 145*         IMAGING x48h  Dg Chest Port 1 View  04/17/2013   CLINICAL DATA:  Fever. Acute respiratory failure. Sepsis. Acute renal failure.  EXAM: PORTABLE CHEST - 1 VIEW  COMPARISON:  04/16/2013  FINDINGS: Tracheostomy tube and right arm PICC line remain in appropriate position. Diffuse interstitial infiltrates are again seen, suspicious for diffuse pulmonary edema. There is decreased atelectasis seen in lung bases. Small bilateral pleural effusions cannot be excluded. Cardiomegaly is stable.  IMPRESSION: Stable cardiomegaly and diffuse interstitial edema pattern.  Mild decrease in bibasilar atelectasis.   Electronically Signed   By: Earle Gell M.D.   On: 04/17/2013 07:58   Dg Chest Port 1 View  04/16/2013   CLINICAL DATA:  Respiratory failure.  EXAM: PORTABLE CHEST - 1 VIEW  COMPARISON:  04/12/2013  FINDINGS: Tracheostomy tube overlies the airway. Right PICC remains in place with tip overlying the mid SVC. The cardiac silhouette remains enlarged and partially obscured. Lungs remain hypoinflated with similar appearance of bibasilar opacities. Mild degenerative changes are present in the thoracic spine.  IMPRESSION: Bibasilar lung opacities without significant interval change.   Electronically Signed   By: Logan Bores   On: 04/16/2013 08:24      ASSESSMENT / PLAN: PULMONARY A: Acute respiratory failure with chronic resp failure, prolonged vent dependence Trach tube dependence Small Pleural effusions Intermittent mucus plugging  04/17/13: ATC all day yesterday. 30L + since admit (3L since x 7 days). CXR looks wet  P:   Cont vent wean on ATC as  tolerated Follow CXR intermittently Schedule lasix  CARDIOVASCULAR A: Septic shock, resolved Hx of HTN. Suspect diastolic dysfn  Intermittent bradycardia continues  P:  Cont current Rx Scheduled lasix Monitor; appreciate cards consult  RENAL A: Acute kidney injury, resolved. Non-anion gap metabolic acidosis, resolved. Mild hypervolemia Hypernatremia, resolved  04/16/13: Volume overload +. Low Mag  P:   Monitor BMET intermittently Monitor I/Os Correct electrolytes as indicated Dc free water Diurese Replete mag    GASTROINTESTINAL A: SBO from ischemic bowel. S/P laparotomy Protein calorie malnutrition. Diarrhea (short gut ). Colitis, SB ileus on CT abd 2/12.  04/16/13: Tolerateing TF but occ vomit  P:   Post-op care and per CCS   HEMATOLOGIC A: Anemia of critical illness; no active bleed. S/p PRBC 04/17/13 P:  transfuse for Hb < 7; conserve phlebotomies when possible lovenox for DVT prevention  INFECTIOUS A: Severe sepsis, resolved. Leukocytosis - resolved  04/17/13: Febrile, ? UTI P:   Micro and abx as above Await cultures 04/17/13  ENDOCRINE A: Hyperglycemia, mild No prior hx of DM P:   DC Lantus 2/20 Cont SSI for now, consider DC if CBGs remain low off Lantus  NEUROLOGIC A: Post op pain. Deconditioning: has walked with vent with PT in ICU Anxiety  04/16/13: Pleasant but deconditioned  P:   PRN fentanyl. PT/OT as able. Cont PRN alprazolam Transfer to vent SDU ordered 2/19 Ultimately will need rehab in CIR or SNF   GLOBAL 04/17/13: awaiting ICU bed. Family not at bedside   Dr. Brand Males, M.D., [facility].C.P Pulmonary and Critical Care Medicine Staff Physician [place] [facility] Pager: [phone], If no answer or between  15:00h - 7:00h: call [phone]  04/17/2013 10:54 AM

## 2013-04-17 NOTE — Progress Notes (Signed)
eLink Physician-Brief Progress Note Patient Name: Kristi Alexander DOB: 10-18-1925 MRN: [id number]  Date of Service  04/17/2013   HPI/Events of Note  Hgb down to 6.7 from 7.1   eICU Interventions  Transfuse 1 unit of pRBC Post-transfusion CBC   Intervention Category Intermediate Interventions: Bleeding - evaluation and treatment with blood products  Darsi Tien 04/17/2013, 4:57 AM

## 2013-04-17 NOTE — Progress Notes (Signed)
RT note-placed back to full support due moderate distress, will re-attempt wean after rest period.

## 2013-04-17 NOTE — Progress Notes (Signed)
eLink Physician-Brief Progress Note Patient Name: Kristi Alexander DOB: Jan 12, 1926 MRN: [id number]  Date of Service  04/17/2013   HPI/Events of Note  Patient with temp of 101.  HD stable with normal WBC.  Patient has vomited tonight and yesterday.  No drop in sats.  ?Aspiration. WBC WNL   eICU Interventions  Plan: Culture/PCXR Hold ABX for now Tylenol   Intervention Category Minor Interventions: Clinical assessment - ordering diagnostic tests  Jordin Vicencio 04/17/2013, 12:20 AM

## 2013-04-17 NOTE — Progress Notes (Signed)
04/17/13 1303  Vitals  Pulse Rate ! 111  ECG Heart Rate ! 109  Resp ! 26  Oxygen Therapy  SpO2 95 %  1259- Patient desated to 83 and went as low as 73 in the process of changing linens from foley catheter leak. Patient was placed back on vent mode by RT. Sats returned to the 90's. Patient had been short of breath and sat straight up in bed during drop in oxygen saturation. Continues to cough up thick white mucous. Shortness of breath is dissapating on vent mode.

## 2013-04-17 NOTE — Progress Notes (Signed)
16 Days Post-Op  Subjective: Awake on vent, getting unit of blood this am  Objective: Vital signs in last 24 hours: Temp:  [98.5 F (36.9 C)-101.1 F (38.4 C)] 98.5 F (36.9 C) (02/22 0717) Pulse Rate:  [80-135] 100 (02/22 0745) Resp:  [14-42] 22 (02/22 0745) BP: (108-205)/(49-119) 153/65 mmHg (02/22 0745) SpO2:  [93 %-100 %] 97 % (02/22 0745) FiO2 (%):  [8 %-30 %] 30 % (02/22 0730) Weight:  [165 lb 5.5 oz (75 kg)] 165 lb 5.5 oz (75 kg) (02/22 0500) Last BM Date: 04/16/13  Intake/Output from previous day: 02/21 0701 - 02/22 0700 In: 1800 [I.V.:490; NG/GT:1150] Out: 2254 [Urine:2200; Emesis/NG output:50; Stool:4] Intake/Output this shift: Total I/O In: 10 [Other:10] Out: -   General appearance: no distress Resp: coarse bilateral breath sounds with some rales Cardio: regular rate and rhythm GI: peg in place, bs present wound clean, soft nontender  Lab Results:   Recent Labs  04/16/13 0416 04/17/13 0415  WBC 8.9 8.1  HGB 7.1* 6.7*  HCT 23.6* 21.5*  PLT 280 282   BMET  Recent Labs  04/16/13 0416 04/17/13 0415  NA 140 142  K 3.7 4.1  CL 102 106  CO2 29 30  GLUCOSE 132* 150*  BUN 14 16  CREATININE 0.64 0.74  CALCIUM 8.3* 8.5   PT/INR No results found for this basename: LABPROT, INR,  in the last 72 hours ABG No results found for this basename: PHART, PCO2, PO2, HCO3,  in the last 72 hours  Studies/Results: Dg Chest Port 1 View  04/17/2013   CLINICAL DATA:  Fever. Acute respiratory failure. Sepsis. Acute renal failure.  EXAM: PORTABLE CHEST - 1 VIEW  COMPARISON:  04/16/2013  FINDINGS: Tracheostomy tube and right arm PICC line remain in appropriate position. Diffuse interstitial infiltrates are again seen, suspicious for diffuse pulmonary edema. There is decreased atelectasis seen in lung bases. Small bilateral pleural effusions cannot be excluded. Cardiomegaly is stable.  IMPRESSION: Stable cardiomegaly and diffuse interstitial edema pattern.  Mild decrease  in bibasilar atelectasis.   Electronically Signed   By: Earle Gell M.D.   On: 04/17/2013 07:58   Dg Chest Port 1 View  04/16/2013   CLINICAL DATA:  Respiratory failure.  EXAM: PORTABLE CHEST - 1 VIEW  COMPARISON:  04/12/2013  FINDINGS: Tracheostomy tube overlies the airway. Right PICC remains in place with tip overlying the mid SVC. The cardiac silhouette remains enlarged and partially obscured. Lungs remain hypoinflated with similar appearance of bibasilar opacities. Mild degenerative changes are present in the thoracic spine.  IMPRESSION: Bibasilar lung opacities without significant interval change.   Electronically Signed   By: Logan Bores   On: 04/16/2013 08:24    Anti-infectives: Anti-infectives   Start     Dose/Rate Route Frequency Ordered Stop   04/17/13 0830  ciprofloxacin (CIPRO) IVPB 400 mg     400 mg 200 mL/hr over 60 Minutes Intravenous Every 12 hours 04/17/13 0817     03/24/13 0800  ciprofloxacin (CIPRO) IVPB 400 mg  Status:  Discontinued     400 mg 200 mL/hr over 60 Minutes Intravenous Every 24 hours 03/23/13 1241 04/02/13 0839   03/23/13 1700  metroNIDAZOLE (FLAGYL) IVPB 500 mg  Status:  Discontinued     500 mg 100 mL/hr over 60 Minutes Intravenous Every 8 hours 03/23/13 1236 04/02/13 0839   03/23/13 0730  [MAR Hold]  ciprofloxacin (CIPRO) IVPB 400 mg     (On MAR Hold since 03/23/13 0805)   400 mg  200 mL/hr over 60 Minutes Intravenous On call to O.R. 03/23/13 0728 03/23/13 0824   03/23/13 0730  [MAR Hold]  metroNIDAZOLE (FLAGYL) IVPB 500 mg     (On MAR Hold since 03/23/13 0805)   500 mg 100 mL/hr over 60 Minutes Intravenous On call to O.R. 03/23/13 3267 03/23/13 0910      Assessment/Plan: S/p elap with sbr 1. Neuro- no issues 2. CV- cardiology consult for bradycardia, still having intermittently 3. Pulm- appreciate ccm help with vent wean 4. GI- tol tube feeds will continue 5. Fen- needs to be diuresed, will give lasix today, recheck bmet in am 6. ID- appears to  have uti, will start cipro, await culture f/u on yeast finding 7. Heme- getting unit of blood, no obvious source of bleeding, 8. Cont lovenox, scds 9. Await final dispo  [facility] 04/17/2013

## 2013-04-17 NOTE — Plan of Care (Signed)
Problem: Phase I Progression Outcomes Goal: OOB as tolerated unless otherwise ordered Outcome: Progressing Has been able to get up to chair just about every day except today. Experienced O2 desaturation today just with turning for urine clean up.Decreased to 73% while on trach collar. Placed back on ventilator to rest.

## 2013-04-18 ENCOUNTER — Inpatient Hospital Stay (HOSPITAL_COMMUNITY): Payer: Medicare Other

## 2013-04-18 DIAGNOSIS — R209 Unspecified disturbances of skin sensation: Secondary | ICD-10-CM

## 2013-04-18 LAB — CBC WITH DIFFERENTIAL/PLATELET
BASOS ABS: 0 10*3/uL (ref 0.0–0.1)
Basophils Relative: 0 % (ref 0–1)
Eosinophils Absolute: 0.2 10*3/uL (ref 0.0–0.7)
Eosinophils Relative: 2 % (ref 0–5)
HCT: 25.4 % — ABNORMAL LOW (ref 36.0–46.0)
Hemoglobin: 8 g/dL — ABNORMAL LOW (ref 12.0–15.0)
LYMPHS ABS: 1.2 10*3/uL (ref 0.7–4.0)
LYMPHS PCT: 13 % (ref 12–46)
MCH: 26.1 pg (ref 26.0–34.0)
MCHC: 31.5 g/dL (ref 30.0–36.0)
MCV: 83 fL (ref 78.0–100.0)
Monocytes Absolute: 0.6 10*3/uL (ref 0.1–1.0)
Monocytes Relative: 7 % (ref 3–12)
NEUTROS ABS: 7.1 10*3/uL (ref 1.7–7.7)
NEUTROS PCT: 78 % — AB (ref 43–77)
PLATELETS: 294 10*3/uL (ref 150–400)
RBC: 3.06 MIL/uL — AB (ref 3.87–5.11)
RDW: 20.4 % — ABNORMAL HIGH (ref 11.5–15.5)
WBC: 9.1 10*3/uL (ref 4.0–10.5)

## 2013-04-18 LAB — GLUCOSE, CAPILLARY
GLUCOSE-CAPILLARY: 139 mg/dL — AB (ref 70–99)
GLUCOSE-CAPILLARY: 140 mg/dL — AB (ref 70–99)
Glucose-Capillary: 119 mg/dL — ABNORMAL HIGH (ref 70–99)
Glucose-Capillary: 128 mg/dL — ABNORMAL HIGH (ref 70–99)
Glucose-Capillary: 141 mg/dL — ABNORMAL HIGH (ref 70–99)
Glucose-Capillary: 143 mg/dL — ABNORMAL HIGH (ref 70–99)

## 2013-04-18 LAB — TYPE AND SCREEN
ABO/RH(D): O POS
ANTIBODY SCREEN: NEGATIVE
UNIT DIVISION: 0

## 2013-04-18 LAB — BASIC METABOLIC PANEL
BUN: 16 mg/dL (ref 6–23)
CO2: 32 mEq/L (ref 19–32)
CREATININE: 0.71 mg/dL (ref 0.50–1.10)
Calcium: 8.7 mg/dL (ref 8.4–10.5)
Chloride: 104 mEq/L (ref 96–112)
GFR calc Af Amer: 88 mL/min — ABNORMAL LOW (ref >90)
GFR, EST NON AFRICAN AMERICAN: 76 mL/min — AB (ref >90)
GLUCOSE: 131 mg/dL — AB (ref 70–99)
POTASSIUM: 4.3 meq/L (ref 3.7–5.3)
Sodium: 143 mEq/L (ref 137–147)

## 2013-04-18 LAB — PHOSPHORUS: Phosphorus: 2.4 mg/dL (ref 2.3–4.6)

## 2013-04-18 LAB — LACTIC ACID, PLASMA: Lactic Acid, Venous: 0.7 mmol/L (ref 0.5–2.2)

## 2013-04-18 LAB — TROPONIN I: Troponin I: <0.3 ng/mL (ref <0.30)

## 2013-04-18 LAB — MAGNESIUM: Magnesium: 1.8 mg/dL (ref 1.5–2.5)

## 2013-04-18 LAB — PREALBUMIN: PREALBUMIN: 10.7 mg/dL — AB (ref 17.0–34.0)

## 2013-04-18 LAB — PRO B NATRIURETIC PEPTIDE: Pro B Natriuretic peptide (BNP): 3112 pg/mL — ABNORMAL HIGH (ref 0–450)

## 2013-04-18 LAB — PROCALCITONIN

## 2013-04-18 MED ORDER — MAGNESIUM SULFATE 50 % IJ SOLN
2.0000 g | Freq: Once | INTRAVENOUS | Status: AC
  Administered 2013-04-18: 2 g via INTRAVENOUS
  Filled 2013-04-18: qty 4

## 2013-04-18 NOTE — Progress Notes (Signed)
Patient ID: DEMIANA CRUMBLEY, female   DOB: 1925/04/09, 80 y.o.   MRN: [id number] 17 Days Post-Op  Subjective: Pt smiled when I walked in. No abdominal pain  Objective: Vital signs in last 24 hours: Temp:  [98.4 F (36.9 C)-100.1 F (37.8 C)] 98.7 F (37.1 C) (02/23 0748) Pulse Rate:  [85-112] 89 (02/23 0748) Resp:  [16-38] 20 (02/23 0748) BP: (114-174)/(50-114) 169/68 mmHg (02/23 0748) SpO2:  [76 %-100 %] 98 % (02/23 0748) FiO2 (%):  [28 %-30 %] 30 % (02/23 0748) Last BM Date: 04/18/13  Intake/Output from previous day: 02/22 0701 - 02/23 0700 In: 2590 [I.V.:450; Blood:325; NG/GT:1150; IV Piggyback:500] Out: 4426 [Urine:4426] Intake/Output this shift: Total I/O In: -  Out: 200 [Urine:200]  PE: Abd: soft, wound is healed.  Tolerating PEG tub feeds.    Lab Results:   Recent Labs  04/17/13 0415 04/18/13 0610  WBC 8.1 9.1  HGB 6.7* 8.0*  HCT 21.5* 25.4*  PLT 282 294   BMET  Recent Labs  04/17/13 0415 04/18/13 0610  NA 142 143  K 4.1 4.3  CL 106 104  CO2 30 32  GLUCOSE 150* 131*  BUN 16 16  CREATININE 0.74 0.71  CALCIUM 8.5 8.7   PT/INR No results found for this basename: LABPROT, INR,  in the last 72 hours CMP     Component Value Date/Time   NA 143 04/18/2013 0610   K 4.3 04/18/2013 0610   CL 104 04/18/2013 0610   CO2 32 04/18/2013 0610   GLUCOSE 131* 04/18/2013 0610   BUN 16 04/18/2013 0610   CREATININE 0.71 04/18/2013 0610   CALCIUM 8.7 04/18/2013 0610   PROT 5.3* 04/16/2013 0416   ALBUMIN 1.5* 04/16/2013 0416   AST 22 04/16/2013 0416   ALT 21 04/16/2013 0416   ALKPHOS 140* 04/16/2013 0416   BILITOT 0.4 04/16/2013 0416   GFRNONAA 76* 04/18/2013 0610   GFRAA 88* 04/18/2013 0610   Lipase  No results found for this basename: lipase       Studies/Results: Dg Chest Port 1 View  04/18/2013   CLINICAL DATA:  Coughing.  EXAM: PORTABLE CHEST - 1 VIEW  COMPARISON:  04/17/2013 and 11/15/2009.  FINDINGS: Tracheostomy tube tip midline. Right central line tip mid  superior vena cava level.  Slight decrease in degree of pulmonary edema. Underlying chronic lung changes.  Right base subsegmental atelectasis suspected. Subtle infiltrate not excluded. Appearance without significant change.  Cardiomegaly.  Tortuous aorta.  Left shoulder joint degenerative changes.  IMPRESSION: Slight decrease in degree of pulmonary edema.  Right base parenchymal changes may represent atelectasis. Subtle infiltrate not excluded.   Electronically Signed   By: Chauncey Cruel M.D.   On: 04/18/2013 07:31   Dg Chest Port 1 View  04/17/2013   CLINICAL DATA:  Fever. Acute respiratory failure. Sepsis. Acute renal failure.  EXAM: PORTABLE CHEST - 1 VIEW  COMPARISON:  04/16/2013  FINDINGS: Tracheostomy tube and right arm PICC line remain in appropriate position. Diffuse interstitial infiltrates are again seen, suspicious for diffuse pulmonary edema. There is decreased atelectasis seen in lung bases. Small bilateral pleural effusions cannot be excluded. Cardiomegaly is stable.  IMPRESSION: Stable cardiomegaly and diffuse interstitial edema pattern.  Mild decrease in bibasilar atelectasis.   Electronically Signed   By: Earle Gell M.D.   On: 04/17/2013 07:58    Anti-infectives: Anti-infectives   Start     Dose/Rate Route Frequency Ordered Stop   04/17/13 0830  ciprofloxacin (CIPRO) IVPB 400 mg  400 mg 200 mL/hr over 60 Minutes Intravenous Every 12 hours 04/17/13 0817     03/24/13 0800  ciprofloxacin (CIPRO) IVPB 400 mg  Status:  Discontinued     400 mg 200 mL/hr over 60 Minutes Intravenous Every 24 hours 03/23/13 1241 04/02/13 0839   03/23/13 1700  metroNIDAZOLE (FLAGYL) IVPB 500 mg  Status:  Discontinued     500 mg 100 mL/hr over 60 Minutes Intravenous Every 8 hours 03/23/13 1236 04/02/13 0839   03/23/13 0730  [MAR Hold]  ciprofloxacin (CIPRO) IVPB 400 mg     (On MAR Hold since 03/23/13 0805)   400 mg 200 mL/hr over 60 Minutes Intravenous On call to O.R. 03/23/13 0728 03/23/13 0824    03/23/13 0730  [MAR Hold]  metroNIDAZOLE (FLAGYL) IVPB 500 mg     (On MAR Hold since 03/23/13 0805)   500 mg 100 mL/hr over 60 Minutes Intravenous On call to O.R. 03/23/13 3709 03/23/13 0910       Assessment/Plan  SBO S/P SBR POD#26  -tolerating TF  -activity as tolerated  Acute respiratory failure-s/p trach. Appreciate CCM assistance with weaning. Episodes of bradycardia continue with pulmonary issues, mostly at night  Intermittent Bradycardia - appreciate cardiology evaluation and assistance  Sepsis 2/2 peritonitis, resolved  Anemia of critical illness - received 1 unit of pRBCs yesterday AKI-resolved  S/P PEG, getting TFs at goal rate. Diarrhea resolved, Imodium to PRN  ID - off ABX, WBC normal.  Fever 36 hours ago. No further fever.  Likely has UTI, started on Cipro, blood cx, respir cx, and urine cx pending.  Respiratory cx with gram + cocci and a few gram - rods presents  Deconditioned - PT/OT, up to chair. HTN- Appreciate CCM assistance.  PCM- TF via PEG  DM-CBGs, lantus and novolog  VTE - SCDs, lovenox  Dispo - cont to wean in SDU.      LOS: 27 days    Eain Mullendore E 04/18/2013, 8:15 AM Pager: [phone]

## 2013-04-18 NOTE — Progress Notes (Addendum)
CSW called administrator with [facility] in [place] and left a voicemail requesting return call with bed availability. CSW called [facility] and Ship broker of nursing is reviewing information now Advertising account executive will check with DON and let CSW know if their facility can offer a bed.  Addendum: CSW called Scientist, physiological with [facility] in [place] and left a voicemail requesting a call back concerning bed availability and if facility needs additional information to review.  Addendum: Admissions with [facility] states they do not have a bed available at this time, but if one became available pt could come to this facility. CSW to continue following up with [facility] regularly to inquire about bed availability.  Addendum: [facility] in [place] states they have a bed for pt but need updated labs/cultures and progress note for insurance auth. CSW sending these to facility now. Facility asked CSW to also check with family to see if they have completed a Medicaid application. Pt has also been clinically approved for placement at [facility] in [place], [place] and this facility is wondering if they should initiate [place] Medicaid application process. CSW to speak with family about these options.  Ky Barban, MSW, [facility] Clinical Social Worker [phone]

## 2013-04-18 NOTE — Progress Notes (Signed)
Pt tolerated OOB in chair throughout day on Trach collar. Copious secretions in am only. Awaiting family. Will notify social work as bed awaits pt at 2 LTAC facilities.

## 2013-04-18 NOTE — Progress Notes (Addendum)
CSW called pt's husband and left a voicemail explaining CSW calling to discuss discharge planning. CSW called pt's daughter [name] and explained that referrals have been made to [place], [facility] in [place], [facility] in [place], and [facility] in [place]. CSW explained that RNCM informed CSW that family is not interested in [place], and [place] confirmed. CSW explained [facility] is the closest facility and this is in [place], but they are currently full. CSW explained CSW checks with facility regularly and has asked them to hold on to pt's clinicals and inform CSW if a bed becomes available. CSW explained [facility] in [place] has been able to offer pt a bed, but they must first verify insurance and facility wanted some labs to submit for insurance auth. CSW sent this information to [facility] facility this afternoon and is waiting for facility to hear back from insurance. Facility wondered if family has completed Medicaid application, and [name] states they have not. CSW explained facility might want this completed if they anticipate pt will need a long stay at SNF to wean from trach; however, this is not anticipated as pt is handling trach better today and belief is that pt will wean. CSW explained pt has bed offer from facility in [place] and that [facility] will inform them to hold on to clinicals, but that the preference is for [place] vent SNF at this time and we are waiting for insurance authorization and if pt gets this, family would prefer placement in [place]. CSW to follow up with [facility] facility only in the event pt does not get insurance auth for [place] facility. [name] states this is a good plan. [facility]. CSW asked if [name] has any questions for CSW at this time, and [name] states she does not. CSW invited [name] to call with any questions, explaining CSW leaves at 4:30 but [name] is welcome to leave a voicemail if anything comes up and CSW will call back the  next morning. CSW reiterated that at this time, pt has bed offer from [facility] vent SNF but facility needs insurance auth and CSW is following case and waiting for pt to be deemed medically ready for discharge.  Addendum: CSW received e-mail from administrator with [facility] in [place] that on the day of transport, pt's daughter/husband/family member would have to come to facility to fill out admissions paperwork. If they are unable to do this, administrator can fax it and go through the paperwork over the phone, but facility states it is much easier to go through this in person. CSW just received call from pt's RN that daughters are visiting and are in the waiting room now. CSW going down to inform family of this now.  Addendum: CSW spoke with pt's daughter [name] and explained facility would need her or another family member to fill out admissions paperwork on the day of discharge. [name] states she will be able to go do this, and is hopeful that pt will get a bed at [facility] in [facility] as this facility is closer. CSW explained CSW will continue to follow up with [facility] and to inform this facility that they are the first choice, however if pt is medically ready to leave the hospital and a bed is not available at [facility] but is available at [facility], pt will have to be discharged to [facility] and family can transfer pt to [facility] when a bed opens. [name] understands  this and asked CSW to follow up with [facility] every day. CSW will do this. CSW also explained that we are waiting for insurance authorization from [facility] in [place], and that the facility will let CSW know when they get this. [name] understanding of this. CSW will keep [name] informed of progress on referrals.   Ky Barban, MSW, [facility] Clinical Social Worker [phone]

## 2013-04-18 NOTE — Progress Notes (Signed)
Doing well from her surgery.  Still difficult to wean Tolerating tube feeds at goal rate with good bowel function  Will follow wound. CCM to manage her cardiopulmonary issues.  Imogene Burn. Georgette Dover, MD, [facility]  General/ Trauma Surgery  04/18/2013 2:19 PM

## 2013-04-18 NOTE — Progress Notes (Addendum)
Name: Kristi Alexander MRN: [id number] DOB: 1926/01/28    ADMISSION DATE:  03/22/2013 CONSULTATION DATE:  1/28  REFERRING MD :  Dr Hulen Skains PRIMARY SERVICE: CCS  CHIEF COMPLAINT:  Vent and medical management   BRIEF PATIENT DESCRIPTION: 80 y/o female (wife of Kristi office patient Kristi Alexander for COPD) with hx HTN admitted 1/27 with SBO.  Initially managed conservatively but cont to have worsening abd pain and increased WBC and was taken to OR 1/28 for exp lap and bowel resection.  Remained on vent post op and PCCM consulted.   LINES / TUBES: ETT 1/28>>>1/29; 2/1>>> 2/5, 2/5 >>2/6 L brachial aline 1/28>>> out Trach 2/6 >>  RUE PICC 2/1 >> G tube 2/6 >>   CULTURES: Urine 1/28>>> negative C.Diff >>> Neg ...................... Urine 2/22>>>positive for UTI Blood 2/22>>> Sputum 2/22 >>>gnr, gpr>>>   ANTIBIOTICS: Flagyl 1/28>>>2/7 Cipro 1/29 >> 2/7, 2/22 (dirty urine + fever) >>  SIGNIFICANT EVENTS / STUDIES:  1/27 - admitted with SBO, conservative management  1/28 - necrotic bowel suspected, to OR, 3-4 feet frankly necrotic bowel removed (no perforation)  2/01 - Re-intubated; CT abd: possible adhesions causing obstruction of SB, colon thickening ?infection or ischemia 2/08 - On vent overnight ? Mucus plug 2/09 - Off vent 2/10 - on vent overnight >> mucus plug, transfuse 1 unit PRBC 2/11 - on vent overnight for being worn out 2/12 - unable to sleep off vent in early hours of AM, working too hard, placed back on vent 2/12 - CT abd/pelvis >> SB ileus, ? Colitis transverse colon 2/16 - 2/18 unable to perform ATC. Weaned on PSV. Ambulating with assistance (on vent!) 2/19 - comfortable on ATC 2/21 - No overnight issues. 300cc vomit yesterday related to cough. Tolerating tube feeds. Smiling and talking through trach; she is the wife my office patient 2/22 - Febrile; cultured. Started on cipro. Got 1 unit PRBC. Had another episode vomit last night. Some episode of sinus bradycardia again; to  HR 20. ATC all day yesterday. 30L + since admit (3L since x 7 days). CXR looks wet  SUBJECTIVE:  No acute events. Up to chair this am.    VITAL SIGNS: Temp:  [98.4 F (36.9 C)-100 F (37.8 C)] 98.7 F (37.1 C) (02/23 0748) Pulse Rate:  [85-112] 89 (02/23 0748) Resp:  [18-38] 20 (02/23 0748) BP: (114-174)/(57-114) 169/68 mmHg (02/23 0748) SpO2:  [76 %-100 %] 98 % (02/23 0857) FiO2 (%):  [28 %-30 %] 30 % (02/23 0857)  INTAKE / OUTPUT: Intake/Output     02/22 0701 - 02/23 0700 02/23 0701 - 02/24 0700   I.V. (mL/kg) 450 (6) 40 (0.5)   Blood 325    Other 165    NG/GT 1150 100   IV Piggyback 500 200   Total Intake(mL/kg) 2590 (34.5) 340 (4.5)   Urine (mL/kg/hr) 4426 (2.5) 200 (0.9)   Emesis/NG output     Stool     Total Output 4426 200   Net -1836 +140        Stool Occurrence  1 x     PHYSICAL EXAMINATION: General: chronically ill in NAD, up to chair Neuro: No focal deficits HEENT: trach site clean, ATC in place Cardiovascular: RRR s M Lungs: coarse breath sounds Abdomen: non tender, +BS Ext: ++ edema  CBC  Recent Labs Lab 04/16/13 0416 04/17/13 0415 04/18/13 0610  HGB 7.1* 6.7* 8.0*  HCT 23.6* 21.5* 25.4*  WBC 8.9 8.1 9.1  PLT 280 282 294   CARDIAC  Recent Labs Lab 04/18/13 0610  TROPONINI <0.30    Recent Labs Lab 04/17/13 0415 04/18/13 0610  PROBNP 4415.0* 3112.0*   CHEMISTRY  Recent Labs Lab 04/13/13 0415 04/14/13 0430 04/16/13 0416 04/17/13 0415 04/18/13 0610  NA 146 141 140 142 143  K 3.3* 3.9 3.7 4.1 4.3  CL 108 106 102 106 104  CO2 30 27 29 30  32  GLUCOSE 110* 156* 132* 150* 131*  BUN 17 16 14 16 16   CREATININE 0.72 0.67 0.64 0.74 0.71  CALCIUM 8.3* 8.0* 8.3* 8.5 8.7  MG  --   --   --  1.3* 1.8  PHOS  --   --   --  2.5 2.4   Estimated Creatinine Clearance: 47.9 ml/min (by C-G formula based on Cr of 0.71).  LIVER  Recent Labs Lab 04/16/13 0416  AST 22  ALT 21  ALKPHOS 140*  BILITOT 0.4  PROT 5.3*  ALBUMIN 1.5*    INFECTIOUS  Recent Labs Lab 04/18/13 0610  LATICACIDVEN 0.7  PROCALCITON <0.10   IMAGING x48h  Dg Chest [facility]  04/18/2013   CLINICAL DATA:  Coughing.  EXAM: PORTABLE CHEST - 1 VIEW  COMPARISON:  04/17/2013 and 11/15/2009.  FINDINGS: Tracheostomy tube tip midline. Right central line tip mid superior vena cava level.  Slight decrease in degree of pulmonary edema. Underlying chronic lung changes.  Right base subsegmental atelectasis suspected. Subtle infiltrate not excluded. Appearance without significant change.  Cardiomegaly.  Tortuous aorta.  Left shoulder joint degenerative changes.  IMPRESSION: Slight decrease in degree of pulmonary edema.  Right base parenchymal changes may represent atelectasis. Subtle infiltrate not excluded.   Electronically Signed   By: Chauncey Cruel M.D.   On: 04/18/2013 07:31   Dg Chest Port 1 View  04/17/2013   CLINICAL DATA:  Fever. Acute respiratory failure. Sepsis. Acute renal failure.  EXAM: PORTABLE CHEST - 1 VIEW  COMPARISON:  04/16/2013  FINDINGS: Tracheostomy tube and right arm PICC line remain in appropriate position. Diffuse interstitial infiltrates are again seen, suspicious for diffuse pulmonary edema. There is decreased atelectasis seen in lung bases. Small bilateral pleural effusions cannot be excluded. Cardiomegaly is stable.  IMPRESSION: Stable cardiomegaly and diffuse interstitial edema pattern.  Mild decrease in bibasilar atelectasis.   Electronically Signed   By: Earle Gell M.D.   On: 04/17/2013 07:58      ASSESSMENT / PLAN: PULMONARY A: Acute respiratory failure with chronic resp failure, prolonged vent dependence Trach tube dependence Small Pleural effusions Intermittent mucus plugging  P:   Cont ATC as tolerated, QHS vent, goal 12 hrs today Follow CXR intermittently for fevers Scheduled lasix 40 BID, continue neg 1-2 liters daily Continue pulmicort, xopenex Ensure cuff down, pmv?  CARDIOVASCULAR A: Septic shock -  resolved Hx of HTN Suspect diastolic dysfn Intermittent bradycardia continues  P:  Continue PRN hydralazine Scheduled lasix, important, pcxr c/w edema Appreciate Cardiology consult  RENAL A: Acute kidney injury - resolved Non-anion gap metabolic acidosis - resolved Mild hypervolemia Hypernatremia - resolved  P:   Monitor BMET intermittently Monitor I/Os Correct electrolytes as indicated Diurese contnue this mag supp  GASTROINTESTINAL A: SBO from ischemic bowel s/p laparotomy Protein calorie malnutrition. Diarrhea (short gut ). Colitis, SB ileus on CT abd 2/12.  P:   Post-op care and per CCS PRN antiemetic   HEMATOLOGIC A: Anemia of critical illness - no active bleed. S/p PRBC 04/17/13 P:  transfuse for Hb < 7; conserve phlebotomies when possible lovenox for DVT  prevention  INFECTIOUS A: Severe sepsis - resolved Leukocytosis - resolved Fever - noted 2/22 UTI  P:   Micro and abx as above Await cultures 04/17/13 pcxr in am  UA positive, ensure new foley or none it is old from 14th If spike further, change to vanc, imi  ENDOCRINE A: Hyperglycemia, mild No prior hx of DM P:   DC Lantus 2/20 Cont SSI for now, consider DC if CBGs remain low off Lantus (range 119-140)  NEUROLOGIC A: Post op pain. Deconditioning - has walked with vent with PT in ICU Anxiety  P:   PRN fentanyl. PT/OT as able. Cont PRN alprazolam Ultimately will need rehab in CIR or SNF +/- vent   GLOBAL -work toward vent SNF  -ongoing PT efforts  Noe Gens, NP-C [place] Pgr: [phone] or [phone]  04/18/2013 9:59 AM  I have fully examined this patient and agree with above findings.    And edited infull  Lavon Paganini. [name], MD, [place] Pgr: [facility]

## 2013-04-19 LAB — CBC WITH DIFFERENTIAL/PLATELET
BASOS ABS: 0 10*3/uL (ref 0.0–0.1)
Basophils Relative: 0 % (ref 0–1)
EOS PCT: 2 % (ref 0–5)
Eosinophils Absolute: 0.2 10*3/uL (ref 0.0–0.7)
HEMATOCRIT: 29.6 % — AB (ref 36.0–46.0)
HEMOGLOBIN: 9.4 g/dL — AB (ref 12.0–15.0)
LYMPHS PCT: 14 % (ref 12–46)
Lymphs Abs: 1.6 10*3/uL (ref 0.7–4.0)
MCH: 26.6 pg (ref 26.0–34.0)
MCHC: 31.8 g/dL (ref 30.0–36.0)
MCV: 83.6 fL (ref 78.0–100.0)
MONO ABS: 0.7 10*3/uL (ref 0.1–1.0)
MONOS PCT: 6 % (ref 3–12)
Neutro Abs: 8.8 10*3/uL — ABNORMAL HIGH (ref 1.7–7.7)
Neutrophils Relative %: 78 % — ABNORMAL HIGH (ref 43–77)
Platelets: 301 10*3/uL (ref 150–400)
RBC: 3.54 MIL/uL — ABNORMAL LOW (ref 3.87–5.11)
RDW: 20.3 % — AB (ref 11.5–15.5)
WBC: 11.3 10*3/uL — ABNORMAL HIGH (ref 4.0–10.5)

## 2013-04-19 LAB — BASIC METABOLIC PANEL
BUN: 16 mg/dL (ref 6–23)
CO2: 33 mEq/L — ABNORMAL HIGH (ref 19–32)
Calcium: 9.2 mg/dL (ref 8.4–10.5)
Chloride: 99 mEq/L (ref 96–112)
Creatinine, Ser: 0.65 mg/dL (ref 0.50–1.10)
GFR calc Af Amer: >90 mL/min (ref >90)
GFR calc non Af Amer: 78 mL/min — ABNORMAL LOW (ref >90)
Glucose, Bld: 157 mg/dL — ABNORMAL HIGH (ref 70–99)
Potassium: 4.1 mEq/L (ref 3.7–5.3)
Sodium: 141 mEq/L (ref 137–147)

## 2013-04-19 LAB — GLUCOSE, CAPILLARY
GLUCOSE-CAPILLARY: 149 mg/dL — AB (ref 70–99)
Glucose-Capillary: 118 mg/dL — ABNORMAL HIGH (ref 70–99)
Glucose-Capillary: 126 mg/dL — ABNORMAL HIGH (ref 70–99)
Glucose-Capillary: 139 mg/dL — ABNORMAL HIGH (ref 70–99)
Glucose-Capillary: 165 mg/dL — ABNORMAL HIGH (ref 70–99)

## 2013-04-19 LAB — CULTURE, RESPIRATORY W GRAM STAIN

## 2013-04-19 LAB — PHOSPHORUS: Phosphorus: 3.3 mg/dL (ref 2.3–4.6)

## 2013-04-19 LAB — MAGNESIUM: Magnesium: 1.9 mg/dL (ref 1.5–2.5)

## 2013-04-19 LAB — PROCALCITONIN: PROCALCITONIN: 0.1 ng/mL

## 2013-04-19 MED ORDER — LEVALBUTEROL HCL 0.63 MG/3ML IN NEBU: 0.6300 mg | INHALATION_SOLUTION | RESPIRATORY_TRACT | Status: DC | PRN

## 2013-04-19 MED ORDER — LOPERAMIDE HCL 1 MG/5ML PO LIQD: 2.0000 mg | ORAL | Status: DC | PRN

## 2013-04-19 MED ORDER — LEVALBUTEROL HCL 0.63 MG/3ML IN NEBU: 0.6300 mg | INHALATION_SOLUTION | Freq: Four times a day (QID) | RESPIRATORY_TRACT | Status: DC

## 2013-04-19 MED ORDER — FUROSEMIDE 10 MG/ML IJ SOLN
20.0000 mg | Freq: Two times a day (BID) | INTRAMUSCULAR | Status: DC
  Administered 2013-04-19 (×2): 20 mg via INTRAVENOUS

## 2013-04-19 MED ORDER — BUDESONIDE 0.25 MG/2ML IN SUSP: 0.2500 mg | Freq: Four times a day (QID) | RESPIRATORY_TRACT | Status: DC

## 2013-04-19 MED ORDER — POTASSIUM CHLORIDE 20 MEQ/15ML (10%) PO LIQD: 20.0000 meq | Freq: Three times a day (TID) | ORAL | Status: DC

## 2013-04-19 MED ORDER — FUROSEMIDE 10 MG/ML IJ SOLN: 20.0000 mg | Freq: Two times a day (BID) | INTRAMUSCULAR | Status: DC

## 2013-04-19 MED ORDER — VITAL AF 1.2 CAL PO LIQD: 50.0000 mL/h | ORAL | Status: DC

## 2013-04-19 MED ORDER — FAMOTIDINE 40 MG/5ML PO SUSR: 20.0000 mg | Freq: Every day | ORAL | Status: DC

## 2013-04-19 NOTE — Procedures (Signed)
Tracheostomy Change Note  Patient Details:   Name: Kristi Alexander DOB: Jan 07, 1926 MRN: [id number]    Airway Documentation:     Evaluation  O2 sats: stable throughout Complications: No apparent complications Patient did tolerate procedure well. Bilateral Breath Sounds: Clear;Diminished Suctioning: Airway Positive c02 BBS scatt rhonchi with good cough.  Revonda Standard 04/19/2013, 3:24 PM

## 2013-04-19 NOTE — Progress Notes (Signed)
RT Note-Orders received to change trach to #6 uncuffed. Cuff deflated and passy muir place. Good vocalization.

## 2013-04-19 NOTE — Discharge Summary (Signed)
Imogene Burn. Georgette Dover, MD, [facility] Surgery  General/ Trauma Surgery  04/19/2013 2:09 PM

## 2013-04-19 NOTE — Progress Notes (Signed)
Gave report to nursing home on pt. Pt left with ambulance at approximately 1745. Pt is alert and oriented. Pt in no distress at departure. [name] here when ambulance was here to pick pt up. Daughter following pt to nursing home.

## 2013-04-19 NOTE — Discharge Instructions (Signed)
[facility]      [facility] [phone]  OPEN ABDOMINAL SURGERY: POST OP INSTRUCTIONS  Always review your discharge instruction sheet given to you by the facility where your surgery was performed.  IF YOU HAVE DISABILITY OR FAMILY LEAVE FORMS, YOU MUST BRING THEM TO THE OFFICE FOR PROCESSING.  PLEASE DO NOT GIVE THEM TO YOUR DOCTOR.  1. A prescription for pain medication may be given to you upon discharge.  Take your pain medication as prescribed, if needed.  If narcotic pain medicine is not needed, then you may take acetaminophen (Tylenol) or ibuprofen (Advil) as needed. 2. Take your usually prescribed medications unless otherwise directed. 3. If you need a refill on your pain medication, please contact your pharmacy. They will contact our office to request authorization.  Prescriptions will not be filled after 5pm or on week-ends. 4. You should follow a light diet the first few days after arrival home, such as soup and crackers, pudding, etc.unless your doctor has advised otherwise. A high-fiber, low fat diet can be resumed as tolerated.   Be sure to include lots of fluids daily. Most patients will experience some swelling and bruising on the chest and neck area.  Ice packs will help.  Swelling and bruising can take several days to resolve 5. Most patients will experience some swelling and bruising in the area of the incision. Ice pack will help. Swelling and bruising can take several days to resolve..  6. It is common to experience some constipation if taking pain medication after surgery.  Increasing fluid intake and taking a stool softener will usually help or prevent this problem from occurring.  A mild laxative (Milk of Magnesia or Miralax) should be taken according to package directions if there are no bowel movements after 48 hours. 7.  You may have steri-strips (small skin tapes) in place directly over the incision.  These strips should be left on the skin for 7-10 days.  If your  surgeon used skin glue on the incision, you may shower in 24 hours.  The glue will flake off over the next 2-3 weeks.  Any sutures or staples will be removed at the office during your follow-up visit. You may find that a light gauze bandage over your incision may keep your staples from being rubbed or pulled. You may shower and replace the bandage daily. 8. ACTIVITIES:  You may resume regular (light) daily activities beginning the next day--such as daily self-care, walking, climbing stairs--gradually increasing activities as tolerated.  You may have sexual intercourse when it is comfortable.  Refrain from any heavy lifting or straining until approved by your doctor. a. You may drive when you no longer are taking prescription pain medication, you can comfortably wear a seatbelt, and you can safely maneuver your car and apply brakes b. Return to Work: ___________________________________ 9. You should see your doctor in the office for a follow-up appointment approximately two weeks after your surgery.  Make sure that you call for this appointment within a day or two after you arrive home to insure a convenient appointment time. OTHER INSTRUCTIONS:  _____________________________________________________________ _____________________________________________________________  WHEN TO CALL YOUR DOCTOR: 1. Fever over 101.0 2. Inability to urinate 3. Nausea and/or vomiting 4. Extreme swelling or bruising 5. Continued bleeding from incision. 6. Increased pain, redness, or drainage from the incision. 7. Difficulty swallowing or breathing 8. Muscle cramping or spasms. 9. Numbness or tingling in hands or feet or around lips.  The clinic staff is available to   answer your questions during regular business hours.  Please don't hesitate to call and ask to speak to one of the nurses if you have concerns.  For further questions, please visit [email or web address]   

## 2013-04-19 NOTE — Progress Notes (Signed)
Patient ID: Kristi Alexander, female   DOB: [birth year], 80 y.o.   MRN: [id number] 18 Days Post-Op  Subjective: Pt has been off the vent the whole night.  She feels tired this morning though.  Doing much better it seems.  No abdominal c/o  Objective: Vital signs in last 24 hours: Temp:  [98 F (36.7 C)-98.6 F (37 C)] 98.2 F (36.8 C) (02/24 0344) Pulse Rate:  [81-102] 94 (02/24 0400) Resp:  [16-30] 21 (02/24 0400) BP: (139-186)/(59-71) 139/66 mmHg (02/24 0400) SpO2:  [97 %-100 %] 99 % (02/24 0400) FiO2 (%):  [28 %-30 %] 28 % (02/24 0256) Weight:  [157 lb 13.6 oz (71.6 kg)-158 lb (71.668 kg)] 157 lb 13.6 oz (71.6 kg) (02/24 0344) Last BM Date: 04/18/13  Intake/Output from previous day: 02/23 0701 - 02/24 0700 In: 1320 [I.V.:460; NG/GT:660; IV Piggyback:200] Out: 5000 [Urine:5000] Intake/Output this shift:    PE: Abd: soft, NT, PEG in place with TFs running  Lab Results:   Recent Labs  04/18/13 0610 04/19/13 0500  WBC 9.1 11.3*  HGB 8.0* 9.4*  HCT 25.4* 29.6*  PLT 294 301   BMET  Recent Labs  04/18/13 0610 04/19/13 0500  NA 143 141  K 4.3 4.1  CL 104 99  CO2 32 33*  GLUCOSE 131* 157*  BUN 16 16  CREATININE 0.71 0.65  CALCIUM 8.7 9.2   PT/INR No results found for this basename: LABPROT, INR,  in the last 72 hours CMP     Component Value Date/Time   NA 141 04/19/2013 0500   K 4.1 04/19/2013 0500   CL 99 04/19/2013 0500   CO2 33* 04/19/2013 0500   GLUCOSE 157* 04/19/2013 0500   BUN 16 04/19/2013 0500   CREATININE 0.65 04/19/2013 0500   CALCIUM 9.2 04/19/2013 0500   PROT 5.3* 04/16/2013 0416   ALBUMIN 1.5* 04/16/2013 0416   AST 22 04/16/2013 0416   ALT 21 04/16/2013 0416   ALKPHOS 140* 04/16/2013 0416   BILITOT 0.4 04/16/2013 0416   GFRNONAA 78* 04/19/2013 0500   GFRAA >90 04/19/2013 0500   Lipase  No results found for this basename: lipase       Studies/Results: Dg Chest Port 1 View  04/18/2013   CLINICAL DATA:  Coughing.  EXAM: PORTABLE CHEST - 1 VIEW   COMPARISON:  04/17/2013 and 11/15/2009.  FINDINGS: Tracheostomy tube tip midline. Right central line tip mid superior vena cava level.  Slight decrease in degree of pulmonary edema. Underlying chronic lung changes.  Right base subsegmental atelectasis suspected. Subtle infiltrate not excluded. Appearance without significant change.  Cardiomegaly.  Tortuous aorta.  Left shoulder joint degenerative changes.  IMPRESSION: Slight decrease in degree of pulmonary edema.  Right base parenchymal changes may represent atelectasis. Subtle infiltrate not excluded.   Electronically Signed   By: Chauncey Cruel M.D.   On: 04/18/2013 07:31    Anti-infectives: Anti-infectives   Start     Dose/Rate Route Frequency Ordered Stop   04/17/13 0830  ciprofloxacin (CIPRO) IVPB 400 mg     400 mg 200 mL/hr over 60 Minutes Intravenous Every 12 hours 04/17/13 0817     03/24/13 0800  ciprofloxacin (CIPRO) IVPB 400 mg  Status:  Discontinued     400 mg 200 mL/hr over 60 Minutes Intravenous Every 24 hours 03/23/13 1241 04/02/13 0839   03/23/13 1700  metroNIDAZOLE (FLAGYL) IVPB 500 mg  Status:  Discontinued     500 mg 100 mL/hr over 60 Minutes Intravenous Every 8 hours  03/23/13 1236 04/02/13 0839   03/23/13 0730  [MAR Hold]  ciprofloxacin (CIPRO) IVPB 400 mg     (On MAR Hold since 03/23/13 0805)   400 mg 200 mL/hr over 60 Minutes Intravenous On call to O.R. 03/23/13 0728 03/23/13 0824   03/23/13 0730  [MAR Hold]  metroNIDAZOLE (FLAGYL) IVPB 500 mg     (On MAR Hold since 03/23/13 0805)   500 mg 100 mL/hr over 60 Minutes Intravenous On call to O.R. 03/23/13 0388 03/23/13 0910       Assessment/Plan  SBO S/P SBR POD#26  -tolerating TF  -activity as tolerated  Acute respiratory failure-s/p trach. Appreciate CCM assistance with weaning.  Intermittent Bradycardia - appreciate cardiology evaluation and assistance  Sepsis 2/2 peritonitis, resolved  Anemia of critical illness - received 1 unit of pRBCs yesterday  AKI-resolved   S/P PEG, getting TFs at goal rate. Diarrhea resolved, Imodium to PRN  ID - off ABX, WBC normal. Fever 36 hours ago. No further fever. Likely has UTI, started on Cipro, blood cx, respir cx, and urine cx pending. Respiratory cx with gram + cocci and a few gram - rods presents  Deconditioned - PT/OT, up to chair.  HTN- Appreciate CCM assistance.  PCM- TF via PEG  DM-CBGs, lantus and novolog  VTE - SCDs, lovenox     LOS: 28 days    [name] 04/19/2013, 7:53 AM Pager: [phone]

## 2013-04-19 NOTE — Discharge Summary (Signed)
Patient ID: AIGNER HORSEMAN MRN: [id number] DOB/AGE: September 05, 1925 80 y.o.  Admit date: 03/22/2013 Discharge date: 04/19/2013  Procedures:  1. Exploratory laparotomy with SBR by [name] 03-23-13 2. Percutaneous tracheostomy 3. PEG placement  Consults: cardiology and pulmonary/intensive care  Reason for Admission: Kristi Alexander is a 80 year old female with a history of hypertension, HLD, abdominal hysterectomy, appendectomy who presented to [facility] with abdominal pain. Duration of symptoms is 2 days. Onset was sudden. Coarse is worsening. Time pattern is constant. Associated with nausea and vomiting. She had a normal BM yesterday, no recent symptoms of constipation. Modifying factors include; enema this AM without any relief. No aggravating or alleviating factors. She has not eaten or drank anything since last night. Risk factors include; previous surgeries. Denies use of anticoagulants. Denies previous history of obstruction. In the ED CT showed a high grade closed loop small bowel obstruction with transition point, no free air. White count 12.5K, lactic acid 7.1.   Admission Diagnoses:  1. High grade SBO 2. HTN 3. Lactic acidosis 4. leukocytosis  Hospital Course:  The patient was admitted and initially treated conservatively; however, on HD 1, she had continued pain and was taken to the OR where she had ischemic bowel.  She underwent a SBR.  Patient went to ICU intubated.  She was able to be extubated on POD1.    EX LAP: The patient developed a post op ileus.  This took multiple days to resolve.  While NPO she was given TNA.  Finally on POD 9 she started passing flatus and BMs.  She also had a PEG tube placed this day as well.  After being in place for a day, enteral tube feeds were started.  These were advanced per protocol.  She did not have any issues with these and tolerated them her full stay.  Her staples were also removed between POD 10-14.  Her wound has healed well during her stay as well.   Surgically the patient has been stable since this time.  VDRF:  On POD 4, the patient, while on the floor, went into respiratory failure.  She was transferred to the ICU where she was intubated.  She was attempted to wean and extubate on POD 8, but required immediate reintubation.  On POD 9, she underwent a perc trach.  Since this time, she has been mostly managed by CCM with efforts to try and wean her from the vent.  She has had ups and downs.  As of today, POD 26 (surgery), POD 18 from trach, she has been off the vent for close to 24 hours.  Of note, she intermittently will brady down with trach issues such as plugging up for changing cuff out.  Cardiology did evaluate her and rule out any cardiac issues.  This was felt to be secondary to her respiratory issues.    UTI: She did get febrile on 04-17-13 and was noted to have a UTI.  She was started on Cipro.  Culture is pending.  The patient is currently stable for dc to trach SNF.  Please see chart for more detailed information.  Discharge Diagnoses:  Active Problems: SBO (small bowel obstruction) Acute respiratory failure Sepsis  Acute renal failure, resolved anemia, stable Intermittent bradycardia secondary to respiratory issues Deconditioned HTN PCM DM S/p ex lap with SBR S/p perc trach and PEG  Discharge Medications:   Medication List    STOP taking these medications       CALAN SR 240 MG CR tablet  Generic drug:  verapamil     hydrochlorothiazide 25 MG tablet  Commonly known as:  HYDRODIURIL     metoprolol succinate 100 MG 24 hr tablet  Commonly known as:  TOPROL-XL     pramipexole 0.5 MG tablet  Commonly known as:  MIRAPEX      TAKE these medications       aspirin 81 MG tablet  Take 81 mg by mouth daily.     atorvastatin 20 MG tablet  Commonly known as:  LIPITOR  Take 20 mg by mouth daily.     budesonide 0.25 MG/2ML nebulizer solution  Commonly known as:  PULMICORT  Take 2 mLs (0.25 mg total) by  nebulization every 6 (six) hours.     famotidine 40 MG/5ML suspension  Commonly known as:  PEPCID  Place 2.5 mLs (20 mg total) into feeding tube at bedtime.     feeding supplement (VITAL AF 1.2 CAL) Liqd  Place 50 mL/hr into feeding tube continuous.     ferrous sulfate 325 (65 FE) MG tablet  Take 325 mg by mouth daily with breakfast.     furosemide 10 MG/ML injection  Commonly known as:  LASIX  Inject 2 mLs (20 mg total) into the vein 2 (two) times daily.     levalbuterol 0.63 MG/3ML nebulizer solution  Commonly known as:  XOPENEX  Take 3 mLs (0.63 mg total) by nebulization every 3 (three) hours as needed for wheezing or shortness of breath.     levalbuterol 0.63 MG/3ML nebulizer solution  Commonly known as:  XOPENEX  Take 3 mLs (0.63 mg total) by nebulization every 6 (six) hours.     loperamide 1 MG/5ML solution  Commonly known as:  IMODIUM  Place 10 mLs (2 mg total) into feeding tube as needed for diarrhea or loose stools.     potassium chloride 20 MEQ/15ML (10%) solution  Place 15 mLs (20 mEq total) into feeding tube 3 (three) times daily.      Cipro 500mg  BID for UTI  Discharge Instructions: Follow-up Information   Follow up with Gwenyth Ober, MD On 05/24/2013. (11:00am, arrive at 10:40am for paperwork)    Specialty:  General Surgery   Contact information:   [address], [place] [address] [phone]      Follow up with PCP  Signed: Saverio Danker E 04/19/2013, 1:55 PM

## 2013-04-19 NOTE — Progress Notes (Addendum)
CSW called [facility] , [facility], [facility] (left voicemail), [facility], [facility], and [facility] SNFs asking them to review clinicals and let CSW know if they can take pt today. [facility] states they are not sure if this will work but admissions is checking to see if they can take pt. [facility] wondering what type of trach pt has, whether it is cuffed/uncuffed, and the size of trach. CSW going down to unit now to ask these questions and report back to SNF.  Addendum: [facility] unable to take pt; family most interested in [facility] as this is right down the street from pt's home. [facility] states they can take pt and need to have these requirements met before they accept pt officially: need to know what pt is being fed through PEG, need obturator, need to know pt has been uncuffed.  Addendum: CSW sent nutrition notes to facility on [facility]. Texted rep with [facility] and let her know pt has been downsized on cuffed trach and will be switched to cuffless once RT gets the equipment and pt is back in bed.   , MSW, LCSWA Clinical Social Worker [phone] 

## 2013-04-19 NOTE — Progress Notes (Signed)
Imogene Burn. Georgette Dover, MD, [facility]  General/ Trauma Surgery  04/19/2013 2:08 PM

## 2013-04-19 NOTE — Progress Notes (Signed)
Physical Therapy Treatment Patient Details Name: Kristi Alexander MRN: [id number] DOB: 08-May-1925 Today's Date: 04/19/2013 Time: 0263-7858 PT Time Calculation (min): 26 min  PT Assessment / Plan / Recommendation  History of Present Illness Pt s/p exp. lap and resection of 4 foot section of necrotic bowel.  pt back to room intubated.  Extubated 1/29.  Trached 2/6 due to pt having trouble protecting airway. Using ventilator at night and weaning (on trach collar or vent) during the day.   PT Comments   Pt admitted with above. Pt currently with functional limitations due to balance and endurance deficits.  Pt will benefit from skilled PT to increase their independence and safety with mobility to allow discharge to the venue listed below.   Follow Up Recommendations  SNF     Does the patient have the potential to tolerate intense rehabilitation     Barriers to Discharge        Equipment Recommendations  Other (comment) (TBA)    Recommendations for Other Services    Frequency Min 3X/week   Progress towards PT Goals Progress towards PT goals: Progressing toward goals  Plan Current plan remains appropriate    Precautions / Restrictions Precautions Precautions: Fall Restrictions Weight Bearing Restrictions: No   Pertinent Vitals/Pain VSS on 28% trach collar, no pain    Mobility  Bed Mobility Overal bed mobility: Needs Assistance Bed Mobility: Rolling;Sidelying to Sit Rolling: Min assist Sidelying to sit: Min assist;HOB elevated Supine to sit: Mod assist;+2 for physical assistance General bed mobility comments: incr time and truncal assist Transfers Overall transfer level: Needs assistance Equipment used: Rolling walker (2 wheeled) Transfers: Sit to/from Stand Sit to Stand: Mod assist;+2 physical assistance General transfer comment: steady assist for balance; cues for slowing breathing Ambulation/Gait Ambulation/Gait assistance: Min assist;+2 physical assistance Ambulation  Distance (Feet): 250 Feet Assistive device: Rolling walker (2 wheeled) Gait Pattern/deviations: Step-through pattern Gait velocity: slow Gait velocity interpretation: Below normal speed for age/gender General Gait Details: Pt ambulated with RW with steadying assist at times.  Occasional cues for breathing as well as postural stability.  Also needed cues to stay close to RW.     Exercises General Exercises - Lower Extremity Ankle Circles/Pumps: AROM;Both;15 reps;Seated Long Arc Quad: AROM;Both;10 reps;Seated Hip Flexion/Marching: AROM;Both;10 reps;Seated   PT Diagnosis:    PT Problem List:   PT Treatment Interventions:     PT Goals (current goals can now be found in the care plan section)    Visit Information  Last PT Received On: 04/19/13 Assistance Needed: +2 History of Present Illness: Pt s/p exp. lap and resection of 4 foot section of necrotic bowel.  pt back to room intubated.  Extubated 1/29.  Trached 2/6 due to pt having trouble protecting airway. Using ventilator at night and weaning (on trach collar or vent) during the day.    Subjective Data  Subjective: "I am ready."   Cognition  Cognition Arousal/Alertness: Awake/alert Behavior During Therapy: WFL for tasks assessed/performed Overall Cognitive Status: Within Functional Limits for tasks assessed    Balance  Balance Overall balance assessment: Needs assistance;History of Falls Sitting-balance support: No upper extremity supported;Feet supported Sitting balance-Leahy Scale: Good Postural control: Posterior lean Standing balance support: Bilateral upper extremity supported;During functional activity Standing balance-Leahy Scale: Fair Standing balance comment: Needs RW for support.  Lists posteriorly.  End of Session PT - End of Session Equipment Utilized During Treatment: Gait belt;Oxygen (28% trach collar) Activity Tolerance: Patient limited by fatigue Patient left: in chair;with call bell/phone within  reach Nurse Communication: Mobility status   GP     INGOLD,Tawyna Pellot 04/19/2013, 12:53 PM  [facility] [phone] (pager)

## 2013-04-19 NOTE — Progress Notes (Signed)
Name: Kristi Alexander MRN: [id number] DOB: Apr 28, 1925    ADMISSION DATE:  03/22/2013 CONSULTATION DATE:  1/28  REFERRING MD :  Dr Hulen Skains PRIMARY SERVICE: CCS  CHIEF COMPLAINT:  Vent and medical management   BRIEF PATIENT DESCRIPTION: 80 y/o female (wife of MR office patient Mr MIKINZIE MACIEJEWSKI for COPD) with hx HTN admitted 1/27 with SBO.  Initially managed conservatively but cont to have worsening abd pain and increased WBC and was taken to OR 1/28 for exp lap and bowel resection.  Remained on vent post op and PCCM consulted.   LINES / TUBES: ETT 1/28>>>1/29; 2/1>>> 2/5, 2/5 >>2/6 L brachial aline 1/28>>> out Trach 2/6 >>  RUE PICC 2/1 >> G tube 2/6 >>   CULTURES: Urine 1/28>>> negative C.Diff >>> Neg ...................... Urine 2/22>>>positive for UTI Blood 2/22>>> Sputum 2/22 >>>gnr, gpr>>>mod gram neg rod>>>  ANTIBIOTICS: Flagyl 1/28>>>2/7 Cipro 1/29 >> 2/7, 2/22 (dirty urine + fever) >>  SIGNIFICANT EVENTS / STUDIES:  1/27 - admitted with SBO, conservative management  1/28 - necrotic bowel suspected, to OR, 3-4 feet frankly necrotic bowel removed (no perforation)  2/01 - Re-intubated; CT abd: possible adhesions causing obstruction of SB, colon thickening ?infection or ischemia 2/08 - On vent overnight ? Mucus plug 2/09 - Off vent 2/10 - on vent overnight >> mucus plug, transfuse 1 unit PRBC 2/11 - on vent overnight for being worn out 2/12 - unable to sleep off vent in early hours of AM, working too hard, placed back on vent 2/12 - CT abd/pelvis >> SB ileus, ? Colitis transverse colon 2/16 - 2/18 unable to perform ATC. Weaned on PSV. Ambulating with assistance (on vent!) 2/19 - comfortable on ATC 2/21 - No overnight issues. 300cc vomit yesterday related to cough. Tolerating tube feeds. Smiling and talking through trach; she is the wife my office patient 2/22 - Febrile; cultured. Started on cipro. Got 1 unit PRBC. Had another episode vomit last night. Some episode of sinus  bradycardia again; to HR 20. ATC all day yesterday. 30L + since admit (3L since x 7 days). CXR looks wet  SUBJECTIVE:  comfortable on TC   VITAL SIGNS: Temp:  [98 F (36.7 C)-98.7 F (37.1 C)] 98.2 F (36.8 C) (02/24 0344) Pulse Rate:  [81-102] 84 (02/24 0344) Resp:  [16-30] 27 (02/24 0344) BP: (151-186)/(59-71) 151/59 mmHg (02/24 0344) SpO2:  [97 %-100 %] 99 % (02/24 0344) FiO2 (%):  [28 %-30 %] 28 % (02/24 0256) Weight:  [71.6 kg (157 lb 13.6 oz)-71.668 kg (158 lb)] 71.6 kg (157 lb 13.6 oz) (02/24 0344)  INTAKE / OUTPUT: Intake/Output     02/23 0701 - 02/24 0700   I.V. (mL/kg) 460 (6.4)   NG/GT 660   IV Piggyback 200   Total Intake(mL/kg) 1320 (18.4)   Urine (mL/kg/hr) 5000 (2.9)   Total Output 5000   Net -3680       Urine Occurrence 1 x   Stool Occurrence 3 x     PHYSICAL EXAMINATION: General: chronically ill in NAD, up to chair Neuro: No focal deficits, cooperative HEENT: trach site clean, ATC in place Cardiovascular: RRR s M Lungs: slight coarse Abdomen: non tender, +BS Ext: ++ edema  CBC  Recent Labs Lab 04/16/13 0416 04/17/13 0415 04/18/13 0610  HGB 7.1* 6.7* 8.0*  HCT 23.6* 21.5* 25.4*  WBC 8.9 8.1 9.1  PLT 280 282 294   CARDIAC  Recent Labs Lab 04/18/13 0610  TROPONINI <0.30    Recent Labs Lab 04/17/13 0415  04/18/13 0610  PROBNP 4415.0* 3112.0*   CHEMISTRY  Recent Labs Lab 04/13/13 0415 04/14/13 0430 04/16/13 0416 04/17/13 0415 04/18/13 0610  NA 146 141 140 142 143  K 3.3* 3.9 3.7 4.1 4.3  CL 108 106 102 106 104  CO2 30 27 29 30  32  GLUCOSE 110* 156* 132* 150* 131*  BUN 17 16 14 16 16   CREATININE 0.72 0.67 0.64 0.74 0.71  CALCIUM 8.3* 8.0* 8.3* 8.5 8.7  MG  --   --   --  1.3* 1.8  PHOS  --   --   --  2.5 2.4   Estimated Creatinine Clearance: 46.8 ml/min (by C-G formula based on Cr of 0.71).  LIVER  Recent Labs Lab 04/16/13 0416  AST 22  ALT 21  ALKPHOS 140*  BILITOT 0.4  PROT 5.3*  ALBUMIN 1.5*    INFECTIOUS  Recent Labs Lab 04/18/13 0610  LATICACIDVEN 0.7  PROCALCITON <0.10   IMAGING x48h  Dg Chest [facility]  04/18/2013   CLINICAL DATA:  Coughing.  EXAM: PORTABLE CHEST - 1 VIEW  COMPARISON:  04/17/2013 and 11/15/2009.  FINDINGS: Tracheostomy tube tip midline. Right central line tip mid superior vena cava level.  Slight decrease in degree of pulmonary edema. Underlying chronic lung changes.  Right base subsegmental atelectasis suspected. Subtle infiltrate not excluded. Appearance without significant change.  Cardiomegaly.  Tortuous aorta.  Left shoulder joint degenerative changes.  IMPRESSION: Slight decrease in degree of pulmonary edema.  Right base parenchymal changes may represent atelectasis. Subtle infiltrate not excluded.   Electronically Signed   By: Chauncey Cruel M.D.   On: 04/18/2013 07:31   Dg Chest Port 1 View  04/17/2013   CLINICAL DATA:  Fever. Acute respiratory failure. Sepsis. Acute renal failure.  EXAM: PORTABLE CHEST - 1 VIEW  COMPARISON:  04/16/2013  FINDINGS: Tracheostomy tube and right arm PICC line remain in appropriate position. Diffuse interstitial infiltrates are again seen, suspicious for diffuse pulmonary edema. There is decreased atelectasis seen in lung bases. Small bilateral pleural effusions cannot be excluded. Cardiomegaly is stable.  IMPRESSION: Stable cardiomegaly and diffuse interstitial edema pattern.  Mild decrease in bibasilar atelectasis.   Electronically Signed   By: Earle Gell M.D.   On: 04/17/2013 07:58    ASSESSMENT / PLAN: PULMONARY A: Acute respiratory failure with chronic resp failure, prolonged vent dependence Trach tube dependence Small Pleural effusions - improved with neg balance Intermittent mucus plugging resolved HCAP? See ID concern rt base  P:   Cont ATC as tolerated, QHS vent, goal 24 hrs Follow CXR intermittently for fevers Scheduled lasix 40 BID, continue with slight reduction Continue pulmicort, xopenex Ensure cuff  down, pmv increase  CARDIOVASCULAR A: Septic shock - resolved Hx of HTN Suspect diastolic dysfn  P:  Continue PRN hydralazine Scheduled lasix,reduce Appreciate Cardiology consult  RENAL A: Acute kidney injury - resolved Non-anion gap metabolic acidosis - resolved Mild hypervolemia  P:   bmet in am  Lasix reduction Await am labs  GASTROINTESTINAL A: SBO from ischemic bowel s/p laparotomy Protein calorie malnutrition. Diarrhea (short gut ). Colitis, SB ileus on CT abd 2/12.  P:   Post-op care and per CCS PRN antiemetic   HEMATOLOGIC A: Anemia of critical illness - no active bleed. S/p PRBC 04/17/13 P:  transfuse for Hb < 7; conserve phlebotomies when possible lovenox for DVT prevention Allow hemoconcetration  INFECTIOUS A: Fever - noted 2/22, improved UTI  R/o hcap, rt base>? Vs colonizing gram neg rod  P:   Await cultures ID gram neg, she is doing well clinically so may be colinizer pcxr in am awaited New foley placed, dc this when able If spike further, change to vanc, imi  ENDOCRINE A: Hyperglycemia, resolved No prior hx of DM P:   DC Lantus 2/20 Cont SSI for now, consider DC  NEUROLOGIC A: Post op pain. Deconditioning  Anxiety improved  P:   PRN fentanyl. PT/OT as able. Cont PRN alprazolam Ultimately will need rehab in CIR or SNF +/- vent   GLOBAL -to pccm service, if transferred to ltach today will discuss with ccs for DC summary today Follow gram neg rod, may be colinizer, await pcxr, reduce lasix  04/19/2013 5:53 AM  I have fully examined this patient and agree with above findings.    And edited infull  Lavon Paganini. [name], MD, [place] Pgr: [facility]

## 2013-04-19 NOTE — Progress Notes (Addendum)
Pt has been off the vent for almost 24 hours. Up and walking in the hall with PT. CSW wondering if trach SNF would be more appropriate for pt, and will address this with MD to discuss most appropriate level of care placement.  Addendum: CSW texted MD asking if he believes referrals to trach SNFs would be appropriate. CSW will make these referrals if MD confirms.  Ky Barban, MSW, [facility] Clinical Social Worker [phone]

## 2013-04-20 LAB — URINE CULTURE: Colony Count: >=100000

## 2013-04-21 ENCOUNTER — Inpatient Hospital Stay (HOSPITAL_COMMUNITY)
Admission: EM | Admit: 2013-04-21 | Discharge: 2013-04-22 | DRG: 004 | Disposition: A | Discharge status: Skilled Nursing Facility | Payer: Medicare Other | Attending: Pulmonary Disease | Admitting: Pulmonary Disease

## 2013-04-21 ENCOUNTER — Inpatient Hospital Stay (HOSPITAL_COMMUNITY): Payer: Medicare Other

## 2013-04-21 ENCOUNTER — Emergency Department (HOSPITAL_COMMUNITY): Payer: Medicare Other

## 2013-04-21 ENCOUNTER — Encounter (HOSPITAL_COMMUNITY): Payer: Self-pay | Admitting: Emergency Medicine

## 2013-04-21 DIAGNOSIS — Z880 Allergy status to penicillin: Secondary | ICD-10-CM

## 2013-04-21 DIAGNOSIS — R0682 Tachypnea, not elsewhere classified: Secondary | ICD-10-CM | POA: Diagnosis present

## 2013-04-21 DIAGNOSIS — J9620 Acute and chronic respiratory failure, unspecified whether with hypoxia or hypercapnia: Secondary | ICD-10-CM

## 2013-04-21 DIAGNOSIS — J9600 Acute respiratory failure, unspecified whether with hypoxia or hypercapnia: Secondary | ICD-10-CM

## 2013-04-21 DIAGNOSIS — Z430 Encounter for attention to tracheostomy: Principal | ICD-10-CM

## 2013-04-21 DIAGNOSIS — Z801 Family history of malignant neoplasm of trachea, bronchus and lung: Secondary | ICD-10-CM

## 2013-04-21 DIAGNOSIS — J9500 Unspecified tracheostomy complication: Secondary | ICD-10-CM

## 2013-04-21 DIAGNOSIS — E785 Hyperlipidemia, unspecified: Secondary | ICD-10-CM | POA: Diagnosis present

## 2013-04-21 DIAGNOSIS — Z7982 Long term (current) use of aspirin: Secondary | ICD-10-CM

## 2013-04-21 DIAGNOSIS — J189 Pneumonia, unspecified organism: Secondary | ICD-10-CM

## 2013-04-21 DIAGNOSIS — Z8249 Family history of ischemic heart disease and other diseases of the circulatory system: Secondary | ICD-10-CM

## 2013-04-21 DIAGNOSIS — A419 Sepsis, unspecified organism: Secondary | ICD-10-CM

## 2013-04-21 DIAGNOSIS — E2749 Other adrenocortical insufficiency: Secondary | ICD-10-CM | POA: Diagnosis present

## 2013-04-21 DIAGNOSIS — E876 Hypokalemia: Secondary | ICD-10-CM | POA: Diagnosis present

## 2013-04-21 DIAGNOSIS — F17200 Nicotine dependence, unspecified, uncomplicated: Secondary | ICD-10-CM | POA: Diagnosis present

## 2013-04-21 DIAGNOSIS — G2581 Restless legs syndrome: Secondary | ICD-10-CM | POA: Diagnosis present

## 2013-04-21 DIAGNOSIS — I10 Essential (primary) hypertension: Secondary | ICD-10-CM | POA: Diagnosis present

## 2013-04-21 DIAGNOSIS — Z79899 Other long term (current) drug therapy: Secondary | ICD-10-CM

## 2013-04-21 HISTORY — DX: Unspecified osteoarthritis, unspecified site: M19.90

## 2013-04-21 LAB — URINALYSIS, ROUTINE W REFLEX MICROSCOPIC
Bilirubin Urine: NEGATIVE
Glucose, UA: NEGATIVE mg/dL
HGB URINE DIPSTICK: NEGATIVE
Ketones, ur: NEGATIVE mg/dL
Leukocytes, UA: NEGATIVE
Nitrite: NEGATIVE
PH: 8 (ref 5.0–8.0)
PROTEIN: NEGATIVE mg/dL
Specific Gravity, Urine: 1.016 (ref 1.005–1.030)
Urobilinogen, UA: 0.2 mg/dL (ref 0.0–1.0)

## 2013-04-21 LAB — COMPREHENSIVE METABOLIC PANEL
ALT: 27 U/L (ref 0–35)
ALT: 26 U/L (ref 0–35)
AST: 28 U/L (ref 0–37)
AST: 29 U/L (ref 0–37)
Albumin: 2.2 g/dL — ABNORMAL LOW (ref 3.5–5.2)
Albumin: 2.1 g/dL — ABNORMAL LOW (ref 3.5–5.2)
Alkaline Phosphatase: 133 U/L — ABNORMAL HIGH (ref 39–117)
Alkaline Phosphatase: 124 U/L — ABNORMAL HIGH (ref 39–117)
BILIRUBIN TOTAL: 0.5 mg/dL (ref 0.3–1.2)
BUN: 17 mg/dL (ref 6–23)
BUN: 17 mg/dL (ref 6–23)
CALCIUM: 8.9 mg/dL (ref 8.4–10.5)
CO2: 31 mEq/L (ref 19–32)
CO2: 30 meq/L (ref 19–32)
CREATININE: 0.7 mg/dL (ref 0.50–1.10)
Calcium: 9.1 mg/dL (ref 8.4–10.5)
Chloride: 101 mEq/L (ref 96–112)
Chloride: 102 mEq/L (ref 96–112)
Creatinine, Ser: 0.59 mg/dL (ref 0.50–1.10)
GFR calc Af Amer: >90 mL/min (ref >90)
GFR calc non Af Amer: 81 mL/min — ABNORMAL LOW (ref >90)
GFR calc non Af Amer: 76 mL/min — ABNORMAL LOW (ref >90)
GFR, EST AFRICAN AMERICAN: 88 mL/min — AB (ref >90)
Glucose, Bld: 154 mg/dL — ABNORMAL HIGH (ref 70–99)
Glucose, Bld: 123 mg/dL — ABNORMAL HIGH (ref 70–99)
Potassium: 3.7 mEq/L (ref 3.7–5.3)
Potassium: 3.8 mEq/L (ref 3.7–5.3)
Sodium: 141 mEq/L (ref 137–147)
Sodium: 140 mEq/L (ref 137–147)
TOTAL PROTEIN: 6.7 g/dL (ref 6.0–8.3)
Total Bilirubin: 0.4 mg/dL (ref 0.3–1.2)
Total Protein: 6.3 g/dL (ref 6.0–8.3)

## 2013-04-21 LAB — CBC WITH DIFFERENTIAL/PLATELET
BASOS ABS: 0 10*3/uL (ref 0.0–0.1)
Basophils Absolute: 0 10*3/uL (ref 0.0–0.1)
Basophils Relative: 0 % (ref 0–1)
Basophils Relative: 0 % (ref 0–1)
EOS ABS: 0.2 10*3/uL (ref 0.0–0.7)
EOS PCT: 2 % (ref 0–5)
Eosinophils Absolute: 0.2 10*3/uL (ref 0.0–0.7)
Eosinophils Relative: 2 % (ref 0–5)
HEMATOCRIT: 30.9 % — AB (ref 36.0–46.0)
HEMATOCRIT: 29.4 % — AB (ref 36.0–46.0)
HEMOGLOBIN: 10 g/dL — AB (ref 12.0–15.0)
Hemoglobin: 9.2 g/dL — ABNORMAL LOW (ref 12.0–15.0)
LYMPHS ABS: 1.3 10*3/uL (ref 0.7–4.0)
LYMPHS PCT: 14 % (ref 12–46)
Lymphocytes Relative: 16 % (ref 12–46)
Lymphs Abs: 1.7 10*3/uL (ref 0.7–4.0)
MCH: 26.8 pg (ref 26.0–34.0)
MCH: 26.1 pg (ref 26.0–34.0)
MCHC: 32.4 g/dL (ref 30.0–36.0)
MCHC: 31.3 g/dL (ref 30.0–36.0)
MCV: 82.8 fL (ref 78.0–100.0)
MCV: 83.5 fL (ref 78.0–100.0)
MONO ABS: 0.7 10*3/uL (ref 0.1–1.0)
MONO ABS: 1 10*3/uL (ref 0.1–1.0)
MONOS PCT: 6 % (ref 3–12)
Monocytes Relative: 11 % (ref 3–12)
NEUTROS PCT: 76 % (ref 43–77)
Neutro Abs: 8.3 10*3/uL — ABNORMAL HIGH (ref 1.7–7.7)
Neutro Abs: 6.9 10*3/uL (ref 1.7–7.7)
Neutrophils Relative %: 73 % (ref 43–77)
Platelets: 309 10*3/uL (ref 150–400)
Platelets: 272 10*3/uL (ref 150–400)
RBC: 3.73 MIL/uL — ABNORMAL LOW (ref 3.87–5.11)
RBC: 3.52 MIL/uL — AB (ref 3.87–5.11)
RDW: 19.9 % — ABNORMAL HIGH (ref 11.5–15.5)
RDW: 20.1 % — ABNORMAL HIGH (ref 11.5–15.5)
WBC: 11 10*3/uL — ABNORMAL HIGH (ref 4.0–10.5)
WBC: 9.4 10*3/uL (ref 4.0–10.5)

## 2013-04-21 LAB — MAGNESIUM: Magnesium: 1.7 mg/dL (ref 1.5–2.5)

## 2013-04-21 LAB — PROTIME-INR
INR: 1.01 (ref 0.00–1.49)
Prothrombin Time: 13.1 seconds (ref 11.6–15.2)

## 2013-04-21 LAB — POCT I-STAT 3, ART BLOOD GAS (G3+)
Acid-Base Excess: 8 mmol/L — ABNORMAL HIGH (ref 0.0–2.0)
Bicarbonate: 33.7 mEq/L — ABNORMAL HIGH (ref 20.0–24.0)
O2 Saturation: 100 %
PCO2 ART: 53.8 mmHg — AB (ref 35.0–45.0)
PO2 ART: 453 mmHg — AB (ref 80.0–100.0)
Patient temperature: 99.1
TCO2: 35 mmol/L (ref 0–100)
pH, Arterial: 7.407 (ref 7.350–7.450)

## 2013-04-21 LAB — GLUCOSE, CAPILLARY
GLUCOSE-CAPILLARY: 98 mg/dL (ref 70–99)
GLUCOSE-CAPILLARY: 105 mg/dL — AB (ref 70–99)
Glucose-Capillary: 109 mg/dL — ABNORMAL HIGH (ref 70–99)

## 2013-04-21 LAB — APTT: APTT: 23 s — AB (ref 24–37)

## 2013-04-21 LAB — PRO B NATRIURETIC PEPTIDE
PRO B NATRI PEPTIDE: 8813 pg/mL — AB (ref 0–450)
Pro B Natriuretic peptide (BNP): 10944 pg/mL — ABNORMAL HIGH (ref 0–450)

## 2013-04-21 LAB — MRSA PCR SCREENING: MRSA by PCR: NEGATIVE

## 2013-04-21 LAB — LACTIC ACID, PLASMA: Lactic Acid, Venous: 0.8 mmol/L (ref 0.5–2.2)

## 2013-04-21 LAB — PHOSPHORUS: Phosphorus: 3 mg/dL (ref 2.3–4.6)

## 2013-04-21 MED ORDER — MIDAZOLAM HCL 5 MG/5ML IJ SOLN
INTRAMUSCULAR | Status: DC | PRN
  Administered 2013-04-21 (×2): 2 mg via INTRAVENOUS

## 2013-04-21 MED ORDER — VANCOMYCIN HCL IN DEXTROSE 750-5 MG/150ML-% IV SOLN
750.0000 mg | INTRAVENOUS | Status: DC
  Administered 2013-04-21 – 2013-04-22 (×2): 750 mg via INTRAVENOUS
  Filled 2013-04-21 (×2): qty 150

## 2013-04-21 MED ORDER — PANTOPRAZOLE SODIUM 40 MG IV SOLR
40.0000 mg | INTRAVENOUS | Status: DC
  Administered 2013-04-21 – 2013-04-22 (×2): 40 mg via INTRAVENOUS
  Filled 2013-04-21 (×4): qty 40

## 2013-04-21 MED ORDER — VANCOMYCIN HCL IN DEXTROSE 750-5 MG/150ML-% IV SOLN
750.0000 mg | INTRAVENOUS | Status: DC
  Filled 2013-04-21 (×2): qty 150

## 2013-04-21 MED ORDER — FENTANYL CITRATE 0.05 MG/ML IJ SOLN
INTRAMUSCULAR | Status: DC | PRN
  Administered 2013-04-21 (×2): 50 ug via INTRAVENOUS

## 2013-04-21 MED ORDER — PROPOFOL 10 MG/ML IV EMUL
5.0000 ug/kg/min | INTRAVENOUS | Status: DC
  Administered 2013-04-21: 10.169 ug/kg/min via INTRAVENOUS
  Filled 2013-04-21: qty 100

## 2013-04-21 MED ORDER — BIOTENE DRY MOUTH MT LIQD
15.0000 mL | Freq: Two times a day (BID) | OROMUCOSAL | Status: DC
  Administered 2013-04-22: 15 mL via OROMUCOSAL

## 2013-04-21 MED ORDER — DEXTROSE 5 % IV SOLN
1.0000 g | Freq: Three times a day (TID) | INTRAVENOUS | Status: DC
  Administered 2013-04-21 – 2013-04-22 (×5): 1 g via INTRAVENOUS
  Filled 2013-04-21 (×7): qty 1

## 2013-04-21 MED ORDER — VANCOMYCIN HCL IN DEXTROSE 750-5 MG/150ML-% IV SOLN
750.0000 mg | Freq: Once | INTRAVENOUS | Status: DC
  Filled 2013-04-21 (×2): qty 150

## 2013-04-21 MED ORDER — ETOMIDATE 2 MG/ML IV SOLN
INTRAVENOUS | Status: DC | PRN
  Administered 2013-04-21 (×2): 10 mg via INTRAVENOUS

## 2013-04-21 MED ORDER — CHLORHEXIDINE GLUCONATE 0.12 % MT SOLN
15.0000 mL | Freq: Two times a day (BID) | OROMUCOSAL | Status: DC
  Administered 2013-04-21 – 2013-04-22 (×2): 15 mL via OROMUCOSAL
  Filled 2013-04-21: qty 15

## 2013-04-21 MED ORDER — SODIUM CHLORIDE 0.9 % IJ SOLN
10.0000 mL | INTRAMUSCULAR | Status: DC | PRN
  Administered 2013-04-21: 10 mL
  Administered 2013-04-21: 40 mL
  Administered 2013-04-21 – 2013-04-22 (×5): 10 mL

## 2013-04-21 MED ORDER — SODIUM CHLORIDE 0.9 % IV SOLN
25.0000 ug/h | INTRAVENOUS | Status: DC
  Filled 2013-04-21: qty 50

## 2013-04-21 MED ORDER — DIPHENHYDRAMINE HCL 50 MG/ML IJ SOLN
12.5000 mg | Freq: Every evening | INTRAMUSCULAR | Status: DC | PRN
  Administered 2013-04-21: 12.5 mg via INTRAVENOUS
  Filled 2013-04-21: qty 1

## 2013-04-21 MED ORDER — ROCURONIUM BROMIDE 50 MG/5ML IV SOLN
INTRAVENOUS | Status: DC | PRN
  Administered 2013-04-21: 50 mg via INTRAVENOUS

## 2013-04-21 MED ORDER — FUROSEMIDE 10 MG/ML IJ SOLN
40.0000 mg | Freq: Two times a day (BID) | INTRAMUSCULAR | Status: AC
  Administered 2013-04-21 (×2): 40 mg via INTRAVENOUS
  Filled 2013-04-21 (×2): qty 4

## 2013-04-21 NOTE — H&P (Signed)
Name: Kristi Alexander MRN: [id number] DOB: [birth year]    ADMISSION DATE:  04/21/2013 CONSULTATION DATE:  04/21/2013  REFERRING MD :  EDP PRIMARY SERVICE: PCCM  CHIEF COMPLAINT:  Trach dislodgement  BRIEF PATIENT DESCRIPTION: 80 y.o. F with recent admission for SBO s/p resection 03/23/13, was trached at that time [name]). Trach accidentally removed/dislodged while patient was asleep.  SIGNIFICANT EVENTS / STUDIES:  2/26 - brought to ED from [facility] after RN noticed that trach was out during AM rounds.  LINES / TUBES: Foley 2/23 >>> PEG 2/06 ([name]) >>> R PICC 1/31 >>> Trach 2/26 (JY) >>>  CULTURES: None  ANTIBIOTICS: Aztreonam 2/26 >>> Vanc 2/26 >>>  HISTORY OF PRESENT ILLNESS:  80 y.o. F recently admitted for SBO and underwent small bowel resection on 03/23/13.  Pt was trached by DF on 1/28.  She was discharged to [facility] on 04/19/13.  Per EMS and EDP notes, RN was rounding this AM and noted that pt's trach was completely out/dislodged.  EMS was dispatched and pt was brought to [facility].  EDP attempted to insert new trach but could not get through stoma as it was apparently already closed off. PCCM was called to bedside for evaluation/repeat trach. During exam pt was tachypneic and on 2L [place].  She denied any chest pain, SOB, fevers/chills/sweats, N/V/D, abdominal pain.  PAST MEDICAL HISTORY :  Past Medical History  Diagnosis Date  . Restless leg syndrome   . Hypertension   . Dyslipidemia   . History of appendectomy   . H/O: hysterectomy   . Cyst of breast, right, benign solitary   . Inguinal hernia   . History of lumbosacral spine surgery    Past Surgical History  Procedure Laterality Date  . Appendectomy    . Abdominal hysterectomy    . Laparotomy N/A 03/23/2013    Procedure: EXPLORATORY LAPAROTOMY;  Surgeon: Gwenyth Ober, MD;  Location: [place];  Service: General;  Laterality: N/A;  . Bowel resection N/A 03/23/2013    Procedure: SMALL BOWEL RESECTION;   Surgeon: Gwenyth Ober, MD;  Location: [place];  Service: General;  Laterality: N/A;  . Tracheostomy      feinstein  . Esophagogastroduodenoscopy N/A 04/01/2013    Procedure: ESOPHAGOGASTRODUODENOSCOPY (EGD);  Surgeon: Gwenyth Ober, MD;  Location: [place];  Service: General;  Laterality: N/A;  . Peg placement N/A 04/01/2013    Procedure: PERCUTANEOUS ENDOSCOPIC GASTROSTOMY (PEG) PLACEMENT;  Surgeon: Gwenyth Ober, MD;  Location: [place];  Service: General;  Laterality: N/A;   Prior to Admission medications   Medication Sig Start Date End Date Taking? Authorizing Provider  aspirin 81 MG tablet Take 81 mg by mouth daily.   Yes Historical Provider, MD  atorvastatin (LIPITOR) 20 MG tablet Take 20 mg by mouth daily. 01/04/13  Yes Historical Provider, MD  budesonide (PULMICORT) 0.25 MG/2ML nebulizer solution Take 2 mLs (0.25 mg total) by nebulization every 6 (six) hours. 04/19/13  Yes Donita Brooks, NP  ciprofloxacin (CIPRO) 500 MG tablet Take 500 mg by mouth 2 (two) times daily.   Yes Historical Provider, MD  famotidine (PEPCID) 40 MG/5ML suspension Place 2.5 mLs (20 mg total) into feeding tube at bedtime. 04/19/13  Yes Donita Brooks, NP  ferrous sulfate 325 (65 FE) MG tablet Take 325 mg by mouth daily with breakfast.   Yes Historical Provider, MD  furosemide (LASIX) 10 MG/ML injection Inject 2 mLs (20 mg total) into the vein 2 (two) times daily. 04/19/13  Yes Donita Brooks, NP  levalbuterol (XOPENEX) 0.63 MG/3ML nebulizer solution Take 3 mLs (0.63 mg total) by nebulization every 3 (three) hours as needed for wheezing or shortness of breath. 04/19/13  Yes Donita Brooks, NP  loperamide (IMODIUM) 1 MG/5ML solution Place 2 mg into feeding tube every 4 (four) hours as needed for diarrhea or loose stools. 04/19/13  Yes Donita Brooks, NP  Nutritional Supplements (FEEDING SUPPLEMENT, VITAL AF 1.2 CAL,) LIQD Place 50 mL/hr into feeding tube continuous. 04/19/13  Yes Donita Brooks, NP  potassium chloride  20 MEQ/15ML (10%) solution Place 15 mLs (20 mEq total) into feeding tube 3 (three) times daily. 04/19/13  Yes Donita Brooks, NP   Allergies  Allergen Reactions  . Penicillins Swelling    FAMILY HISTORY:  Family History  Problem Relation Age of Onset  . Heart attack Father   . Lung cancer Brother   . Healthy Sister    SOCIAL HISTORY:  reports that she has been smoking Cigarettes.  She has been smoking about 0.00 packs per day. She has never used smokeless tobacco. She reports that she drinks alcohol. She reports that she does not use illicit drugs.  REVIEW OF SYSTEMS:  Negative except as stated in HPI.  SUBJECTIVE: Tachypneic otherwise no complaints.   VITAL SIGNS: Temp:  [98.3 F (36.8 C)] 98.3 F (36.8 C) (02/26 0645) Pulse Rate:  [91-112] 97 (02/26 0730) Resp:  [28-34] 34 (02/26 0730) BP: (174-189)/(72-145) 185/91 mmHg (02/26 0730) SpO2:  [95 %-100 %] 99 % (02/26 0730) Weight:  [58.968 kg (130 lb)] 58.968 kg (130 lb) (02/26 0640) HEMODYNAMICS:   VENTILATOR SETTINGS:   INTAKE / OUTPUT: Intake/Output     02/25 0701 - 02/26 0700 02/26 0701 - 02/27 0700   I.V. (mL/kg)  40 (0.7)   Total Intake(mL/kg)  40 (0.7)   Net   +40          PHYSICAL EXAMINATION: General: Pleasant female, resting in bed, communicating but tachypneic at rest. Neuro: A&O x 3, non-focal.  HEENT: [place]/AT. PERRL, sclerae anicteric. Cardiovascular: RRR, no M/R/G.  Lungs: Tachypneic but unlabored.  CTA bilaterally, No W/R/R. Abdomen: BS x 4, soft, NT/ND. PEG site C/D/I. Musculoskeletal: No gross deformities, no edema. R PICC noted. Skin: Intact, warm, no rashes.   LABS:  CBC  Recent Labs Lab 04/18/13 0610 04/19/13 0500 04/21/13 0701  WBC 9.1 11.3* 11.0*  HGB 8.0* 9.4* 10.0*  HCT 25.4* 29.6* 30.9*  PLT 294 301 309   BMET  Recent Labs Lab 04/18/13 0610 04/19/13 0500 04/21/13 0701  NA 143 141 141  K 4.3 4.1 3.7  CL 104 99 101  CO2 32 33* 31  BUN 16 16 17   CREATININE 0.71 0.65  0.59  GLUCOSE 131* 157* 154*   Electrolytes  Recent Labs Lab 04/17/13 0415 04/18/13 0610 04/19/13 0500 04/21/13 0701  CALCIUM 8.5 8.7 9.2 9.1  MG 1.3* 1.8 1.9  --   PHOS 2.5 2.4 3.3  --    Sepsis Markers  Recent Labs Lab 04/18/13 0610 04/19/13 0500  LATICACIDVEN 0.7  --   PROCALCITON <0.10 0.10   Liver Enzymes  Recent Labs Lab 04/16/13 0416 04/21/13 0701  AST 22 28  ALT 21 27  ALKPHOS 140* 133*  BILITOT 0.4 0.4  ALBUMIN 1.5* 2.2*   Cardiac Enzymes  Recent Labs Lab 04/17/13 0415 04/18/13 0610 04/21/13 0701  TROPONINI  --  <0.30  --   PROBNP 4415.0* 3112.0* 8813.0*   Glucose  Recent Labs Lab 04/18/13 1947 04/18/13 2355 04/19/13 0343 04/19/13 0820 04/19/13 1230 04/19/13 1649  GLUCAP 140* 126* 118* 165* 149* 139*    Imaging Dg Chest Port 1 View  04/21/2013   CLINICAL DATA:  Removal of tracheostomy.  Basilar crackles.  EXAM: PORTABLE CHEST - 1 VIEW  COMPARISON:  DG CHEST 1V PORT dated 04/18/2013; DG CHEST 1V PORT dated 03/23/2013; DG CHEST 1V PORT dated 11/15/2009; DG CHEST 1V PORT dated 03/26/2013  FINDINGS: Interval removal of tracheostomy. Right-sided PICC line terminates at the mid to low as cc.  Moderate cardiomegaly with atherosclerosis in the transverse aorta. No pleural effusion or pneumothorax. Moderate diffuse interstitial thickening. Improved right base airspace disease. Patchy left base airspace disease is not significantly changed. New since 11/15/2009.  IMPRESSION: Similar patchy left base airspace disease, suspicious for infection or aspiration.  Improved right base aeration.  Cardiomegaly with chronic interstitial thickening.   Electronically Signed   By: Abigail Miyamoto M.D.   On: 04/21/2013 07:18     ASSESSMENT / PLAN:  PULMONARY A: Acute Respiratory Failure Accidental Tracheostomy Dislodgement Concern for atelectasis vs infiltrates P:   - Will place ETT followed by bedside bronch and repeat trach. - Admit to ICU for monitoring. - Full  vent support for now until paralytics wear off then transition to ATC. - Antibiotics as above - CXR in AM. - If no complications, then hopefull d/c 2/27.  CARDIOVASCULAR A:  HTN Elevated BNP P:  - Lasix as ordered. - Echo. - f/u on Lactate.  RENAL A: No acute issues. P:   - BMP in AM.  GASTROINTESTINAL A:  NPO. S/p PEG P:   - SUP: Pantoprazole. - Could resume diet 2/27.  HEMATOLOGIC A:  No acute issues. P:  - CBC in AM. - DVT Prophylaxis: SCD's.  No anticoags s/p trach.  INFECTIOUS A: No acute issues. Concern for atelectasis vs infiltrates - given no fever/leukocytosis/cough, favor ATX. P:   - Consider d/c antibiotics in AM >>> attending to follow - Monitor fever curve/WBC's.  ENDOCRINE A:   No acute issues. P:   - Monitor glucose on BMP. - F/u cortisol.  NEUROLOGIC A: Deconditioning. P:   - Monitor.   Montey Hora, PA - [facility] [place] [facility] Pgr: [phone]  or [phone]  Admit to the ICU, intubated then retrached emergently.  Anticipate will be able to come off the vent relatively quickly over the next day or two.  Will likely need to be changed to cuffless trach prior to discharge.  In the meantime, continue abx and f/u on cultures.  I have personally obtained a history, examined the patient, evaluated laboratory and imaging results, formulated the assessment and plan and placed orders.  CRITICAL CARE: The patient is critically ill with multiple organ systems failure and requires high complexity decision making for assessment and support, frequent evaluation and titration of therapies, application of advanced monitoring technologies and extensive interpretation of multiple databases. Critical Care Time devoted to patient care services described in this note is 90 minutes.   Rush Farmer, M.D. [facility] Pulmonary/Critical Care Medicine. Pager: [phone]. After hours pager: [phone].

## 2013-04-21 NOTE — ED Notes (Addendum)
EDP, PA, pulmonology at bedside, Dr. Ane Payment intubating positive color change Bilateral breath sounds confirmed by RT 7.0 ET tube, 23 at the lip, positive color change, bilateral breath sounds

## 2013-04-21 NOTE — ED Notes (Signed)
EDP attempted to place a 32mm trach, unable to successfully place.

## 2013-04-21 NOTE — ED Provider Notes (Signed)
0800 - Care from Dr. Reather Converse. 65F here with Trach complication. Recent admission for SBO, respiratory failure, had trach placed at that visit. Here labs shows mild white count, similar to prior, normal lytes. BNP elevated at 8813. IV team got her PICC line working again. HCAP antibiotics started with Vanc, Aztreonam. Patient seen by Critical Care, who will re-intubate for trach placement. Pulm to admit. Intubated with the glidescope at 843 am. Pulm plans to re-trach using bronchoscope confirmation here in the ED.  1. HCAP (healthcare-associated pneumonia)   2. Tracheostomy complication      Osvaldo Shipper, MD 05/01/13 [phone]

## 2013-04-21 NOTE — ED Notes (Signed)
Pulmonogy, RT at bedside

## 2013-04-21 NOTE — ED Notes (Signed)
Pulmonology & RT at bedside, trach insertion held off for now, will continue to assess

## 2013-04-21 NOTE — ED Notes (Signed)
Dr. Ane Payment placing trach at this time,

## 2013-04-21 NOTE — ED Notes (Signed)
IV team nurse, Clarene Critchley, at bedside to assess PICC line

## 2013-04-21 NOTE — ED Provider Notes (Signed)
CSN: [id number]     Arrival date & time 04/21/13  8938 History   First MD Initiated Contact with Patient 04/21/13 [phone]     Chief Complaint  Patient presents with  . Tracheostomy Tube Change     (Consider location/radiation/quality/duration/timing/severity/associated sxs/prior Treatment) HPI Comments: 80 yo female with complicated recent medical hx including prolonged admission, SBO, surgery, acute resp failure, G tube, foley, pt had trach on March 5 by critical care, lives a NH presents after trach was accidentally removed this am.  Reportedly seen in place at 4 am then on rounds it was removed at 630 am.  No respiratory distress, pt on 2 L [place].   Pt feels okay, recent fever/ UTI, on cipro.  No sob or cp.  BP elevated, pt has not had morning medicines.    The history is provided by the patient.    Past Medical History  Diagnosis Date  . Restless leg syndrome   . Hypertension   . Dyslipidemia   . History of appendectomy   . H/O: hysterectomy   . Cyst of breast, right, benign solitary   . Inguinal hernia   . History of lumbosacral spine surgery    Past Surgical History  Procedure Laterality Date  . Appendectomy    . Abdominal hysterectomy    . Laparotomy N/A 03/23/2013    Procedure: EXPLORATORY LAPAROTOMY;  Surgeon: Gwenyth Ober, MD;  Location: [place];  Service: General;  Laterality: N/A;  . Bowel resection N/A 03/23/2013    Procedure: SMALL BOWEL RESECTION;  Surgeon: Gwenyth Ober, MD;  Location: [place];  Service: General;  Laterality: N/A;  . Tracheostomy      feinstein  . Esophagogastroduodenoscopy N/A 04/01/2013    Procedure: ESOPHAGOGASTRODUODENOSCOPY (EGD);  Surgeon: Gwenyth Ober, MD;  Location: [place];  Service: General;  Laterality: N/A;  . Peg placement N/A 04/01/2013    Procedure: PERCUTANEOUS ENDOSCOPIC GASTROSTOMY (PEG) PLACEMENT;  Surgeon: Gwenyth Ober, MD;  Location: [place] [facility];  Service: General;  Laterality: N/A;   Family History  Problem Relation Age of  Onset  . Heart attack Father   . Lung cancer Brother   . Healthy Sister    History  Substance Use Topics  . Smoking status: Current Some Day Smoker -- 0.00 packs/day    Types: Cigarettes  . Smokeless tobacco: Never Used  . Alcohol Use: Yes     Comment: occ   OB History   Grav Para Term Preterm Abortions TAB SAB Ect Mult Living                 Review of Systems  Constitutional: Positive for fatigue. Negative for fever and chills.  HENT: Negative for congestion.   Respiratory: Negative for shortness of breath.   Cardiovascular: Negative for chest pain.  Gastrointestinal: Negative for vomiting and abdominal pain.  Genitourinary: Negative for dysuria and flank pain.  Musculoskeletal: Positive for arthralgias. Negative for back pain, neck pain and neck stiffness.  Skin: Negative for rash.  Neurological: Negative for light-headedness and headaches.      Allergies  Penicillins  Home Medications   Current Outpatient Rx  Name  Route  Sig  Dispense  Refill  . aspirin 81 MG tablet   Oral   Take 81 mg by mouth daily.         Marland Kitchen atorvastatin (LIPITOR) 20 MG tablet   Oral   Take 20 mg by mouth daily.         . budesonide (  PULMICORT) 0.25 MG/2ML nebulizer solution   Nebulization   Take 2 mLs (0.25 mg total) by nebulization every 6 (six) hours.   60 mL   12   . clobetasol ointment (TEMOVATE) 0.05 %               . famotidine (PEPCID) 40 MG/5ML suspension   Per Tube   Place 2.5 mLs (20 mg total) into feeding tube at bedtime.   50 mL   0   . ferrous sulfate 325 (65 FE) MG tablet   Oral   Take 325 mg by mouth daily with breakfast.         . furosemide (LASIX) 10 MG/ML injection   Intravenous   Inject 2 mLs (20 mg total) into the vein 2 (two) times daily.   4 mL   0   . levalbuterol (XOPENEX) 0.63 MG/3ML nebulizer solution   Nebulization   Take 3 mLs (0.63 mg total) by nebulization every 3 (three) hours as needed for wheezing or shortness of breath.   3  mL   12   . levalbuterol (XOPENEX) 0.63 MG/3ML nebulizer solution   Nebulization   Take 3 mLs (0.63 mg total) by nebulization every 6 (six) hours.   3 mL   12   . loperamide (IMODIUM) 1 MG/5ML solution   Per Tube   Place 10 mLs (2 mg total) into feeding tube as needed for diarrhea or loose stools.   120 mL   0   . metoprolol succinate (TOPROL-XL) 100 MG 24 hr tablet               . Nutritional Supplements (FEEDING SUPPLEMENT, VITAL AF 1.2 CAL,) LIQD   Per Tube   Place 50 mL/hr into feeding tube continuous.         . potassium chloride 20 MEQ/15ML (10%) solution   Per Tube   Place 15 mLs (20 mEq total) into feeding tube 3 (three) times daily.   500 mL   0   . pramipexole (MIRAPEX) 0.5 MG tablet               . verapamil (CALAN-SR) 240 MG CR tablet                BP 189/145  Pulse 99  Ht 5\' 2"  (1.575 m)  Wt 130 lb (58.968 kg)  BMI 23.77 kg/m2  SpO2 95% Physical Exam  Nursing note and vitals reviewed. Constitutional: She is oriented to person, place, and time. She appears well-developed and well-nourished.  HENT:  Head: Normocephalic and atraumatic.  Mild dry  Mm Trach removed, 1 cm opening in anterior mid neck  Eyes: Conjunctivae are normal. Right eye exhibits no discharge. Left eye exhibits no discharge.  Neck: Normal range of motion. Neck supple. No tracheal deviation present.  Cardiovascular: Regular rhythm.  Tachycardia present.   Pulmonary/Chest: Effort normal. She has rales (at bases bilateral).  Abdominal: Soft. She exhibits no distension. There is no tenderness. There is no guarding.  Musculoskeletal: She exhibits no edema.  Neurological: She is alert and oriented to person, place, and time. No cranial nerve deficit. GCS eye subscore is 4. GCS verbal subscore is 5. GCS motor subscore is 6.  General weakness  Skin: Skin is warm. No rash noted.  Psychiatric: She has a normal mood and affect.    ED Course  Procedures (including critical care  time) Labs Review Labs Reviewed  CBC WITH DIFFERENTIAL  COMPREHENSIVE METABOLIC PANEL  PRO B NATRIURETIC  PEPTIDE   Imaging Review No results found.  EKG Interpretation   None       MDM   Final diagnoses:  None   Spoke with Critical Care, Dr [place] Bells will evaluate at bedside to determine dispo for Trach.   Pt well appearing, no distress. 2 L [place] placed.  BP elevated -- no cp or sob.  Will monitor.   Signed out to fup critical care/ pulm recommendations.   Size 4 trach at bedside.   Trach removal, HTN    Mariea Clonts, MD 04/21/13 [phone]

## 2013-04-21 NOTE — Procedures (Signed)
Intubation Procedure Note Kristi Alexander [id number] [birth year]  Procedure: Intubation Indications: Respiratory insufficiency  Procedure Details Consent: Unable to obtain consent because of emergent medical necessity. Time Out: Verified patient identification, verified procedure, site/side was marked, verified correct patient position, special equipment/implants available, medications/allergies/relevent history reviewed, required imaging and test results available.  Performed  Maximum sterile technique was used including antiseptics, gloves, hand hygiene and mask.  MAC    Evaluation Hemodynamic Status: BP stable throughout; O2 sats: stable throughout Patient's Current Condition: stable Complications: No apparent complications Patient did tolerate procedure well. Chest X-ray ordered to verify placement.  CXR: pending.   Kristi Alexander 04/21/2013

## 2013-04-21 NOTE — ED Notes (Signed)
Unable to access pt's PICC line, unable to draw blood back and resistant met when attempting to flush PICC line. IV team called. PICC line appears to be kinked, attempted to readjust dressing to access PICC line but was unsuccessful.

## 2013-04-21 NOTE — Procedures (Deleted)
Intubation Procedure Note Kristi Alexander [id number] 1925-04-14  Procedure: Intubation Indications: Prior to bronchoscopy  Procedure Details Consent: Unable to obtain consent because of emergent medical necessity. Time Out: Verified patient identification, verified procedure, site/side was marked, verified correct patient position, special equipment/implants available, medications/allergies/relevent history reviewed, required imaging and test results available.  Performed  Maximum sterile technique was used including cap, gloves, gown, hand hygiene and mask.  MAC and 3    Evaluation Hemodynamic Status: BP stable throughout; O2 sats: stable throughout Patient's Current Condition: stable Complications: No apparent complications Patient did tolerate procedure well. Chest X-ray ordered to verify placement.  CXR: pending.   Montey Hora, [place] [facility] Pgr: [phone]  or [phone]

## 2013-04-21 NOTE — Progress Notes (Signed)
UR Completed.  Vergie Living [phone] 04/21/2013

## 2013-04-21 NOTE — Procedures (Signed)
Emergent Percutaneous Tracheostomy Placement  Unable to obtain consent, patient is in respiratory failure.  Patient sedated, paralyzed and intubated as procedure note earlier.  Area cleaned, lidocaine with epi injected, neck dissected, airway palpated and wire placed and visualized bronchscopically, airway crushed and dilated.  Size 6 cuffed trach placed and visualized well above carina.  Good volume return and color change.  CXR ordered and pending.  Rush Farmer, M.D. [facility] Pulmonary/Critical Care Medicine. Pager: [phone]. After hours pager: [phone].

## 2013-04-21 NOTE — ED Notes (Signed)
Portable chest xray at bedside.

## 2013-04-21 NOTE — ED Notes (Signed)
0853: trach placed, ventilator applied, positive color change 6 cuff, bilateral breath sounds x ray called

## 2013-04-21 NOTE — Procedures (Signed)
Bronchoscopy Procedure Note Kristi Alexander [id number] [birth year]  Procedure: Bronchoscopy Indications: Repeat tracheostomy after accidental dislodgement  Procedure Details Consent: Unable to obtain consent because of emergent medical necessity. Time Out: Verified patient identification, verified procedure, site/side was marked, verified correct patient position, special equipment/implants available, medications/allergies/relevent history reviewed, required imaging and test results available.  Performed  In preparation for procedure, patient was given 100% FiO2 and bronchoscope lubricated. Sedation: Etomidate  Airway entered and the following bronchi were examined: Bronchi.   Bronchoscope removed.  , Patient placed back on 100% FiO2 at conclusion of procedure.    Evaluation Hemodynamic Status: BP stable throughout; O2 sats: stable throughout Patient's Current Condition: stable Specimens:  None Complications: No apparent complications Patient did tolerate procedure well.  Montey Hora, PA - [facility] [place] [facility] Pgr: [phone]  or [phone]  I was present and supervised the entire procedure.  Rush Farmer, M.D. [place] Pulmonary/Critical Care Medicine. Pager: [phone]. After hours pager: [phone].

## 2013-04-21 NOTE — ED Notes (Addendum)
Per EMS pt is from [facility]. Pt recently had surgery for small bowel resection on 03/23/13. Pt has a PICC line in her right arm and a peg tube in her lower left abdomen. Pt had a tracheostomy placed on 1/28. EMS reports the pt removed her tracheostomy during her sleep, care provider at nursing facility believes the trach was removed after 0400 this morning. Tracheostomy hole is closed upon initial assessment to the department. Pt is not in any distress at this time. Pt reports she has been coughing. EMS reports the pt is being treated currently for a UTI. Pt is A&O X4. PT has a hx of HTN. Pt HTN upon arrival to department.

## 2013-04-21 NOTE — Progress Notes (Signed)
ANTIBIOTIC CONSULT NOTE - INITIAL  Pharmacy Consult for aztreonam and vancomycin Indication: HCAP  Allergies  Allergen Reactions  . Penicillins Swelling    Patient Measurements: Height: 5\' 2"  (157.5 cm) Weight: 130 lb (58.968 kg) IBW/kg (Calculated) : 50.1  Vital Signs: Temp: 98.3 F (36.8 C) (02/26 0645) Temp src: Oral (02/26 0645) BP: 185/91 mmHg (02/26 0730) Pulse Rate: 97 (02/26 0730) Intake/Output from previous day:   Intake/Output from this shift: Total I/O In: 40 [I.V.:40] Out: -   Labs:  Recent Labs  04/19/13 0500 04/21/13 0701  WBC 11.3* 11.0*  HGB 9.4* 10.0*  PLT 301 309  CREATININE 0.65 0.59   Estimated Creatinine Clearance: 39.9 ml/min (by C-G formula based on Cr of 0.59). No results found for this basename: VANCOTROUGH, Corlis Leak, VANCORANDOM, [place], GENTPEAK, GENTRANDOM, TOBRATROUGH, TOBRAPEAK, TOBRARND, AMIKACINPEAK, AMIKACINTROU, AMIKACIN,  in the last 72 hours   Microbiology: Recent Results (from the past 720 hour(s))  URINE CULTURE     Status: None   Collection Time    03/22/13  1:56 PM      Result Value Ref Range Status   Specimen Description URINE, CLEAN CATCH   Final   Special Requests NONE   Final   Culture  Setup Time     Final   Value: 03/22/2013 20:58     Performed at [place]     Final   Value: 50,000 COLONIES/ML     Performed at [facility]   Culture     Final   Value: Multiple bacterial morphotypes present, none predominant. Suggest appropriate recollection if clinically indicated.     Performed at [facility]   Report Status 03/23/2013 FINAL   Final  SURGICAL PCR SCREEN     Status: Abnormal   Collection Time    03/23/13  7:51 AM      Result Value Ref Range Status   MRSA, PCR NEGATIVE  NEGATIVE Final   Staphylococcus aureus POSITIVE (*) NEGATIVE Final   Comment:            The Xpert SA Assay (FDA     approved for NASAL specimens     in patients over 81 years of age),   is one component of     a comprehensive surveillance     program.  Test performance has     been validated by [employer] for patients greater     than or equal to 59 year old.     It is not intended     to diagnose infection nor to     guide or monitor treatment.  CLOSTRIDIUM DIFFICILE BY PCR     Status: None   Collection Time    04/04/13  6:14 AM      Result Value Ref Range Status   C difficile by pcr NEGATIVE  NEGATIVE Final  URINE CULTURE     Status: None   Collection Time    04/17/13 12:58 AM      Result Value Ref Range Status   Specimen Description URINE, CATHETERIZED   Final   Special Requests NONE   Final   Culture  Setup Time     Final   Value: 04/17/2013 11:19     Performed at [place]     Final   Value: >=100,000 COLONIES/ML     Performed at [place]     Final  Value: ENTEROCOCCUS SPECIES     Performed at [facility]   Report Status 04/20/2013 FINAL   Final   Organism ID, Bacteria ENTEROCOCCUS SPECIES   Final  CULTURE, BLOOD (ROUTINE X 2)     Status: None   Collection Time    04/17/13  1:25 AM      Result Value Ref Range Status   Specimen Description BLOOD LEFT HAND   Final   Special Requests BOTTLES DRAWN AEROBIC AND ANAEROBIC 5CC EACH   Final   Culture  Setup Time     Final   Value: 04/17/2013 11:07     Performed at [facility]   Culture     Final   Value:        BLOOD CULTURE RECEIVED NO GROWTH TO DATE CULTURE WILL BE HELD FOR 5 DAYS BEFORE ISSUING A FINAL NEGATIVE REPORT     Performed at [facility]   Report Status PENDING   Incomplete  CULTURE, BLOOD (ROUTINE X 2)     Status: None   Collection Time    04/17/13  1:40 AM      Result Value Ref Range Status   Specimen Description BLOOD LEFT HAND   Final   Special Requests BOTTLES DRAWN AEROBIC AND ANAEROBIC 5CC EACH   Final   Culture  Setup Time     Final   Value: 04/17/2013 11:07     Performed at [facility]    Culture     Final   Value:        BLOOD CULTURE RECEIVED NO GROWTH TO DATE CULTURE WILL BE HELD FOR 5 DAYS BEFORE ISSUING A FINAL NEGATIVE REPORT     Performed at [facility]   Report Status PENDING   Incomplete  CULTURE, RESPIRATORY (NON-EXPECTORATED)     Status: None   Collection Time    04/17/13  4:23 AM      Result Value Ref Range Status   Specimen Description TRACHEAL ASPIRATE   Final   Special Requests NONE   Final   Gram Stain     Final   Value: MODERATE WBC PRESENT, PREDOMINANTLY PMN     RARE SQUAMOUS EPITHELIAL CELLS PRESENT     ABUNDANT GRAM POSITIVE RODS     FEW GRAM NEGATIVE RODS     Performed at [facility]   Culture     Final   Value: MODERATE KLEBSIELLA OXYTOCA     Performed at [facility]   Report Status 04/19/2013 FINAL   Final   Organism ID, Bacteria KLEBSIELLA OXYTOCA   Final    Medical History: Past Medical History  Diagnosis Date  . Restless leg syndrome   . Hypertension   . Dyslipidemia   . History of appendectomy   . H/O: hysterectomy   . Cyst of breast, right, benign solitary   . Inguinal hernia   . History of lumbosacral spine surgery     Medications:  See PTA medication list  Assessment: 80 y/o female with a recently discharged after a long and complicated hospital stay due to SBO s/p ex lap with SBR on 1/28. Patient presents to the ED today after removing her trach while sleeping. Pharmacy consulted to begin vancomycin and aztreonam for HCAP. Renal function is normal, WBC are elevated, and patient is afebrile currently.  Goal of Therapy:  Resolution of infection  Plan:  -Aztreonam 1 g IV q8h -Vancomycin 750 mg IV q24h -Monitor renal function, clinical course, vancomycin  trough as indicated  [facility], Pharm.D., BCPS Clinical Pharmacist Pager: [phone] 04/21/2013 8:03 AM

## 2013-04-22 ENCOUNTER — Inpatient Hospital Stay (HOSPITAL_COMMUNITY): Payer: Medicare Other

## 2013-04-22 ENCOUNTER — Encounter (HOSPITAL_COMMUNITY): Payer: Self-pay | Admitting: Emergency Medicine

## 2013-04-22 ENCOUNTER — Emergency Department (HOSPITAL_COMMUNITY)
Admission: EM | Admit: 2013-04-22 | Discharge: 2013-04-22 | Disposition: A | Discharge status: Home / Self Care | Payer: Medicare Other | Attending: Emergency Medicine | Admitting: Emergency Medicine

## 2013-04-22 DIAGNOSIS — J9620 Acute and chronic respiratory failure, unspecified whether with hypoxia or hypercapnia: Secondary | ICD-10-CM

## 2013-04-22 DIAGNOSIS — Z87891 Personal history of nicotine dependence: Secondary | ICD-10-CM | POA: Insufficient documentation

## 2013-04-22 DIAGNOSIS — I517 Cardiomegaly: Secondary | ICD-10-CM

## 2013-04-22 DIAGNOSIS — Z9089 Acquired absence of other organs: Secondary | ICD-10-CM | POA: Insufficient documentation

## 2013-04-22 DIAGNOSIS — Z792 Long term (current) use of antibiotics: Secondary | ICD-10-CM | POA: Insufficient documentation

## 2013-04-22 DIAGNOSIS — Z79899 Other long term (current) drug therapy: Secondary | ICD-10-CM | POA: Insufficient documentation

## 2013-04-22 DIAGNOSIS — I10 Essential (primary) hypertension: Secondary | ICD-10-CM | POA: Insufficient documentation

## 2013-04-22 DIAGNOSIS — Z430 Encounter for attention to tracheostomy: Secondary | ICD-10-CM

## 2013-04-22 DIAGNOSIS — Z90710 Acquired absence of both cervix and uterus: Secondary | ICD-10-CM | POA: Insufficient documentation

## 2013-04-22 DIAGNOSIS — IMO0002 Reserved for concepts with insufficient information to code with codable children: Secondary | ICD-10-CM | POA: Insufficient documentation

## 2013-04-22 DIAGNOSIS — Z8719 Personal history of other diseases of the digestive system: Secondary | ICD-10-CM | POA: Insufficient documentation

## 2013-04-22 DIAGNOSIS — Z7982 Long term (current) use of aspirin: Secondary | ICD-10-CM | POA: Insufficient documentation

## 2013-04-22 DIAGNOSIS — E785 Hyperlipidemia, unspecified: Secondary | ICD-10-CM | POA: Insufficient documentation

## 2013-04-22 DIAGNOSIS — Z880 Allergy status to penicillin: Secondary | ICD-10-CM | POA: Insufficient documentation

## 2013-04-22 DIAGNOSIS — Z8742 Personal history of other diseases of the female genital tract: Secondary | ICD-10-CM | POA: Insufficient documentation

## 2013-04-22 DIAGNOSIS — Z8669 Personal history of other diseases of the nervous system and sense organs: Secondary | ICD-10-CM | POA: Insufficient documentation

## 2013-04-22 DIAGNOSIS — M129 Arthropathy, unspecified: Secondary | ICD-10-CM | POA: Insufficient documentation

## 2013-04-22 LAB — GLUCOSE, CAPILLARY
GLUCOSE-CAPILLARY: 107 mg/dL — AB (ref 70–99)
Glucose-Capillary: 100 mg/dL — ABNORMAL HIGH (ref 70–99)

## 2013-04-22 LAB — CBC
HEMATOCRIT: 31.6 % — AB (ref 36.0–46.0)
Hemoglobin: 10 g/dL — ABNORMAL LOW (ref 12.0–15.0)
MCH: 26.5 pg (ref 26.0–34.0)
MCHC: 31.6 g/dL (ref 30.0–36.0)
MCV: 83.8 fL (ref 78.0–100.0)
Platelets: 300 10*3/uL (ref 150–400)
RBC: 3.77 MIL/uL — ABNORMAL LOW (ref 3.87–5.11)
RDW: 20.1 % — AB (ref 11.5–15.5)
WBC: 10.7 10*3/uL — ABNORMAL HIGH (ref 4.0–10.5)

## 2013-04-22 LAB — BASIC METABOLIC PANEL
BUN: 16 mg/dL (ref 6–23)
CHLORIDE: 99 meq/L (ref 96–112)
CO2: 30 mEq/L (ref 19–32)
Calcium: 8.6 mg/dL (ref 8.4–10.5)
Creatinine, Ser: 0.71 mg/dL (ref 0.50–1.10)
GFR calc Af Amer: 88 mL/min — ABNORMAL LOW (ref >90)
GFR, EST NON AFRICAN AMERICAN: 76 mL/min — AB (ref >90)
GLUCOSE: 85 mg/dL (ref 70–99)
POTASSIUM: 3.2 meq/L — AB (ref 3.7–5.3)
Sodium: 140 mEq/L (ref 137–147)

## 2013-04-22 LAB — URINE CULTURE
COLONY COUNT: NO GROWTH
CULTURE: NO GROWTH

## 2013-04-22 LAB — CORTISOL: Cortisol, Plasma: 9.4 ug/dL

## 2013-04-22 LAB — PHOSPHORUS: Phosphorus: 3.3 mg/dL (ref 2.3–4.6)

## 2013-04-22 LAB — MAGNESIUM: Magnesium: 1.6 mg/dL (ref 1.5–2.5)

## 2013-04-22 MED ORDER — AMLODIPINE BESYLATE 5 MG PO TABS: 5.0000 mg | ORAL_TABLET | Freq: Every day | ORAL | Status: DC

## 2013-04-22 MED ORDER — LEVOFLOXACIN 750 MG PO TABS: 750.0000 mg | ORAL_TABLET | Freq: Every day | ORAL | Status: DC

## 2013-04-22 MED ORDER — AMLODIPINE BESYLATE 5 MG PO TABS
5.0000 mg | ORAL_TABLET | Freq: Every day | ORAL | Status: DC
  Administered 2013-04-22: 5 mg via ORAL
  Filled 2013-04-22: qty 1

## 2013-04-22 MED ORDER — POTASSIUM CHLORIDE 20 MEQ/15ML (10%) PO LIQD
40.0000 meq | Freq: Once | ORAL | Status: AC
  Administered 2013-04-22: 40 meq
  Filled 2013-04-22 (×2): qty 30

## 2013-04-22 NOTE — Discharge Instructions (Signed)
Care of a Tracheostomy Tube  Having a clean tracheostomy tube (trach tube) helps stop infections and keeps tubing from clogging. Follow your doctor's directions for how often someone should change and clean your trach tube. SUPPLIES YOU NEED TO CARE FOR YOUR TRACH TUBE  Towel.  Suction supplies.  Germ-free (sterile) trach care kit.  4x4 inch (10x10 cm) gauze pads.  Cotton swabs.  Trach bandage (dressing).  Germ-free bowl.  0.9% salt-water (saline) solution.  Small brush (or an inner trach tube that can be thrown away [disposable inner cannula]).  Roll of twill tape, trach ties, or trach holder.  Scissors.  Clean gloves.  Germ-free gloves. TRACH CARE 1. Have all supplies ready. 2. Wash hands well. 3. Put on clean gloves. 4. Suction the trach tube as needed. 5. After the suctioning is done, throw away the dirty bandages, gloves, and suction tube (catheter). 6. Wash hands well. 7. Put on the germ-free gloves. 8. Fill the bowl with the salt water. 9. Give oxygen as needed. 10. Clean the tube that is inside the trach tube. This tube is called an inner cannula. For inner cannulas that you cannot throw away:  Unlock and remove the inner cannula.  Drop the inner cannula into the bowl of salt water.  Replace the trach collar, trach tube, or oxygen source over the outer cannula. Do not attach the trach tube and oxygen devices to the outer cannula when the inner cannula is off.  Pick up the inner cannula out of the salt water. Scrub the inside and outside of the inner cannula with the small brush.  Hold the inner cannula over the bowl. Rinse the inner cannula with more salt water.  Replace the inner cannula. Lock it in place.  Give oxygen as needed. For inner cannulas that you can throw away:  Take the new cannula out of the package.  Take out the inner cannula. Only touch the outer part of the trach tube.  Insert the new cannula.  Lock the new cannula in  place.  Throw away the old cannula. 11. Clean the outer cannula and under the neck plate using gauze pads or cotton swabs. Clean the stoma site in a circular motion using a cotton swab. Clean from the edge of the stoma outward. 12. Dry the skin and outer cannula with a dry gauze pad. Pat the areas dry. 13. Secure the trach tube with trach ties or a trach tube holder. 14. Put a germ-free bandage around the trach site. 15. Give oxygen as needed. 16. Throw away used supplies. 17. Take off the gloves. 18. Wash hands well. Document Released: 11/05/2011 Document Revised: 01/28/2012 Document Reviewed: 11/05/2011 [facility] Patient Information 2014 [place].

## 2013-04-22 NOTE — Clinical Documentation Improvement (Signed)
Possible Clinical Conditions?   Aspiration Pneumonia (POA?) Gram Negative Pneumonia (POA?)  Bacterial pneumonia, specify type if known (POA?) Klebsiella PNA E Coli PNA Pseudomonas PNA Candidiasis PNA Staph/MRSA PNA Strep PNA Pneumococcal PNA H Influenza PNA H Para Influenza PNA  Pneumonia (CAP, HAP) (POA?) Other Condition Cannot Clinically Determine    Risk Factors: HCAP noted per 02/26 ED note. Vancomycin per 02/26 pharmacy consult, indication: pneumonia.  Diagnostics: 02/27: cxr: bibasilar atelectasis/pneumonia, unchanged.  Thank You, Theron Arista, Clinical Documentation Specialist:  [phone]  [place] Information Management

## 2013-04-22 NOTE — ED Notes (Signed)
PTAR paged.

## 2013-04-22 NOTE — Progress Notes (Signed)
Patient discharge at 1654. Pt discharged to [facility] via EMS. Patient VSS. No complaints of pain or discomfort. All questions answered. Report called to Colletta Maryland, Therapist, sports at [employer], all questions answered.

## 2013-04-22 NOTE — ED Notes (Signed)
Shireen Quan, nurse caring for Kristi Alexander at [facility] to discuss patient. States pt had no complaints, their facility does not accept or care for cuffed trachs.

## 2013-04-22 NOTE — Progress Notes (Signed)
Name: Kristi Alexander MRN: [id number] DOB: Feb 08, 1926    ADMISSION DATE:  04/21/2013 CONSULTATION DATE:  04/21/2013  REFERRING MD :  EDP PRIMARY SERVICE: PCCM  CHIEF COMPLAINT:  Trach dislodgement  BRIEF PATIENT DESCRIPTION: 80 y.o. F with recent admission for SBO s/p resection 03/23/13, was trached at that time [name]). Trach accidentally removed/dislodged while patient was asleep.  SIGNIFICANT EVENTS / STUDIES:  2/26 - brought to ED from [facility] after RN noticed that trach was out during AM rounds.  LINES / TUBES: Foley 2/23 >>>  PEG 2/06 ([name]) >>>  R PICC 1/31 >>>  Trach 2/26 (JY) >>>  CULTURES: BCx 2/26 >> UCx 2/26 >>  ANTIBIOTICS: Aztreonam 2/26 >>>  Vanc 2/26 >>>  SUBJECTIVE: doing well post-replacement of trach. No complaints this morning.  VITAL SIGNS: Temp:  [98.7 F (37.1 C)-99.7 F (37.6 C)] 99.3 F (37.4 C) (02/27 0427) Pulse Rate:  [87-151] 96 (02/27 0700) Resp:  [6-41] 27 (02/27 0700) BP: (110-191)/(68-109) 163/76 mmHg (02/27 0700) SpO2:  [91 %-100 %] 95 % (02/27 0700) FiO2 (%):  [28 %-100 %] 28 % (02/27 0258) Weight:  [60.9 kg (134 lb 4.2 oz)] 60.9 kg (134 lb 4.2 oz) (02/27 0500) HEMODYNAMICS:   VENTILATOR SETTINGS: Vent Mode:  [-] CPAP;PSV FiO2 (%):  [28 %-100 %] 28 % Set Rate:  [16 bmp] 16 bmp Vt Set:  [450 mL] 450 mL PEEP:  [5 cmH20] 5 cmH20 Pressure Support:  [5 cmH20] 5 cmH20 Plateau Pressure:  [19 cmH20] 19 cmH20 INTAKE / OUTPUT: Intake/Output     02/26 0701 - 02/27 0700 02/27 0701 - 02/28 0700   I.V. (mL/kg) 46.1 (0.8)    IV Piggyback 350    Total Intake(mL/kg) 396.1 (6.5)    Urine (mL/kg/hr) 3875 (2.7)    Total Output 3875     Net -3478.9          Urine Occurrence 1 x      PHYSICAL EXAMINATION: General: Pleasant female, resting in bed, communicating well  Neuro: alert, able to answer questions appropriately HEENT: [place]/AT. Trach in place  Cardiovascular: RRR, no M/R/G.  Lungs: CTAB, no increased work of breathing Abdomen:  PEG site c/d/i, s, NT Musculoskeletal: No gross deformities, no edema. R PICC noted.  Skin: Intact, warm, no rashes.  LABS:  CBC  Recent Labs Lab 04/19/13 0500 04/21/13 0701 04/21/13 1015  WBC 11.3* 11.0* 9.4  HGB 9.4* 10.0* 9.2*  HCT 29.6* 30.9* 29.4*  PLT 301 309 272   Coag's  Recent Labs Lab 04/21/13 1015  APTT 23*  INR 1.01   BMET  Recent Labs Lab 04/19/13 0500 04/21/13 0701 04/21/13 1015  NA 141 141 140  K 4.1 3.7 3.8  CL 99 101 102  CO2 33* 31 30  BUN 16 17 17   CREATININE 0.65 0.59 0.70  GLUCOSE 157* 154* 123*   Electrolytes  Recent Labs Lab 04/18/13 0610 04/19/13 0500 04/21/13 0701 04/21/13 1015  CALCIUM 8.7 9.2 9.1 8.9  MG 1.8 1.9  --  1.7  PHOS 2.4 3.3  --  3.0   Sepsis Markers  Recent Labs Lab 04/18/13 0610 04/19/13 0500 04/21/13 1015  LATICACIDVEN 0.7  --  0.8  PROCALCITON <0.10 0.10  --    ABG  Recent Labs Lab 04/21/13 0944  PHART 7.407  PCO2ART 53.8*  PO2ART 453.0*   Liver Enzymes  Recent Labs Lab 04/16/13 0416 04/21/13 0701 04/21/13 1015  AST 22 28 29   ALT 21 27 26   ALKPHOS 140*  133* 124*  BILITOT 0.4 0.4 0.5  ALBUMIN 1.5* 2.2* 2.1*   Cardiac Enzymes  Recent Labs Lab 04/18/13 0610 04/21/13 0701 04/21/13 1015  TROPONINI <0.30  --   --   PROBNP 3112.0* 8813.0* 10944.0*   Glucose  Recent Labs Lab 04/19/13 1649 04/21/13 0942 04/21/13 1515 04/21/13 1913 04/21/13 2353 04/22/13 0426  GLUCAP 139* 109* 98 105* 107* 100*    Imaging Dg Chest Port 1 View  04/21/2013   CLINICAL DATA:  Tracheostomy placement.  EXAM: PORTABLE CHEST - 1 VIEW  COMPARISON:  DG CHEST 1V PORT dated 04/21/2013; DG CHEST 1V PORT dated 04/18/2013; DG CHEST 1V PORT dated 03/31/2013; DG CHEST 1V PORT dated 03/23/2013  FINDINGS: Tracheostomy tube noted with tip projected over the mid trachea. PICC line noted with tip at cavoatrial junction. Diffuse interstitial prominence noted throughout the lungs. Active interstitial lung disease cannot be  excluded. Mild left base atelectasis and/or alveolar infiltrate noted. Cardiomegaly. Component of congestive heart failure cannot be excluded, no pulmonary venous congestion however is noted. No significant pleural effusion.  IMPRESSION: 1. Tracheostomy tube noted in good anatomic position. PICC line in stable position with tip at cavoatrial junction. 2. Stable diffuse severe interstitial prominence remains. Left lower lobe mild atelectatic changes versus alveolar infiltrate remains.   Electronically Signed   By: Marcello Moores  Register   On: 04/21/2013 09:15   Dg Chest Port 1 View  04/21/2013   CLINICAL DATA:  Removal of tracheostomy.  Basilar crackles.  EXAM: PORTABLE CHEST - 1 VIEW  COMPARISON:  DG CHEST 1V PORT dated 04/18/2013; DG CHEST 1V PORT dated 03/23/2013; DG CHEST 1V PORT dated 11/15/2009; DG CHEST 1V PORT dated 03/26/2013  FINDINGS: Interval removal of tracheostomy. Right-sided PICC line terminates at the mid to low as cc.  Moderate cardiomegaly with atherosclerosis in the transverse aorta. No pleural effusion or pneumothorax. Moderate diffuse interstitial thickening. Improved right base airspace disease. Patchy left base airspace disease is not significantly changed. New since 11/15/2009.  IMPRESSION: Similar patchy left base airspace disease, suspicious for infection or aspiration.  Improved right base aeration.  Cardiomegaly with chronic interstitial thickening.   Electronically Signed   By: Abigail Miyamoto M.D.   On: 04/21/2013 07:18     CXR: increased LLL prominence  ASSESSMENT / PLAN:  PULMONARY  A:  Acute Respiratory Failure  Accidental Tracheostomy Dislodgement  Concern for atelectasis vs infiltrates  P:  - trach in place  - on trach collar - Antibiotics as above  - CXR in AM.   CARDIOVASCULAR  A:  HTN  Elevated BNP  - Echo - nml LV fn ,PA peak pressure: 65mm Hg  P:  - Lasix    RENAL  A:  No acute issues.  P:  - BMP daily  GASTROINTESTINAL  A:  NPO.  S/p PEG  P:  -  SUP: Pantoprazole.  - resume peg feeds  HEMATOLOGIC  A:  No acute issues.  P:  - CBC in AM.  - DVT Prophylaxis: SCD's. No anticoags s/p trach.   INFECTIOUS  A:  Concern for atelectasis vs iHCAP (G neg pneumonia)  - given no fever/leukocytosis/cough, favor ATX.  P:  - Continue antibiotics until cultures return negative - narrow quickly if negative - Monitor fever curve/WBC's.    ENDOCRINE  A:  Adrenal insufficiency P:  - Monitor glucose on BMP.  - cortisol low - does not appear to have been sent home on this after recent hospitalization, will recheck in  Few weeks  NEUROLOGIC  A:  Deconditioning.  P:  - Monitor. - PT to see  Tommi Rumps, MD  [facility] PGY-2  TODAY'S SUMMARY: continue trach collar, continue antibiotics, transfer to sdu vs back to SNF  I have personally obtained a history, examined the patient, evaluated laboratory and imaging results, formulated the assessment and plan and placed orders. CRITICAL CARE: The patient is critically ill with multiple organ systems failure and requires high complexity decision making for assessment and support, frequent evaluation and titration of therapies, application of advanced monitoring technologies and extensive interpretation of multiple databases. Critical Care Time devoted to patient care services described in this note is 32 minutes.   Rigoberto Noel  Pulmonary and Welcome Pager: [phone]  04/22/2013, 7:21 AM

## 2013-04-22 NOTE — Clinical Social Work Psychosocial (Signed)
Clinical Social Work Department BRIEF PSYCHOSOCIAL ASSESSMENT 04/22/2013  Patient:  Kristi Alexander, Kristi Alexander     Account Number:  [id number]     Admit date:  04/21/2013  Clinical Social Worker:  Wylene Men  Date/Time:  04/22/2013 12:38 PM  Referred by:  Physician  Date Referred:  04/22/2013 Referred for  SNF Placement   Other Referral:   none   Interview type:  Other - See comment Other interview type:   pt was sleepting soundly after a night of no rest.  RN requested CSW to allow to pt to sleep if possible. Per RNCM, daughter, Kristi Alexander is actively involved in pt care. CSW discussed return to facility with daughter.    PSYCHOSOCIAL DATA Living Status:  FACILITY Admitted from facility:  [facility] Level of care:  [place] Primary support name:  Kristi Alexander Primary support relationship to patient:  CHILD, ADULT Degree of support available:   strong    CURRENT CONCERNS Current Concerns  Post-Acute Placement   Other Concerns:   none    SOCIAL WORK ASSESSMENT / PLAN CSW spoke with pt daughter re: disposition.  Daughter reports that pt is from [facility].  CSW spoke with Raquelle at SNF to confirm that pt is eligible to return after dc.  Raquelle states that pt is welcome back upon dc.  CSW confirmed that pt is able to return to SNF if weekend dc.    CSW confirmed with daughter, Kristi Alexander that pt will be returning to [facility] upon dc on trach. Kristi Alexander is agreeable to pt returning when medically stable.    Kristi Alexander has arranged with SNF for pt to have a private room which will assist pt with her dissatisfaction with SNF.  Pt and daughter stated that they did not wish to switch SNFs at this time due to convenience in location.  Pt and daughter hopeful re: pt prognosis and looking forward to her being in a private room when returning to SNF.   Assessment/plan status:  Psychosocial Support/Ongoing Assessment of Needs Other assessment/  plan:   none   Information/referral to community resources:   SNF    PATIENT'S/FAMILY'S RESPONSE TO PLAN OF CARE: Pt and daughter is agreeable to return to SNF.       Nonnie Done, [place] [phone]  Clinical Social Work

## 2013-04-22 NOTE — ED Notes (Signed)
Respiratory able to change tracheostomy out and place speaking valve successfully. Pt able to speak freely, maintaining SpO2 100%. Update/Report called to Doctor, general practice at [facility]. Pt able to speak with [name], her daughter via telephone and updated her on her status.

## 2013-04-22 NOTE — Progress Notes (Signed)
Echocardiogram 2D Echocardiogram has been performed.  Kristi Alexander 04/22/2013, 10:12 AM

## 2013-04-22 NOTE — ED Provider Notes (Signed)
CSN: [id number]     Arrival date & time 04/22/13  1807 History   First MD Initiated Contact with Patient 04/22/13 1807     Chief Complaint  Patient presents with  . Tracheostomy Tube Change     (Consider location/radiation/quality/duration/timing/severity/associated sxs/prior Treatment) The history is provided by the patient.   Kristi Alexander is a 80 y.o. female who was discharged, from the hospital earlier today, to a skilled nursing facility. Upon arrival there, the staff realized that her tracheostomy was a cuffed type. They are unable to manage patients with this type of tube. She was sent back here for reevaluation. There are no other reported problems. The patient does not have any complaints. There are no other known modifying factors    Past Medical History  Diagnosis Date  . Restless leg syndrome   . Hypertension   . Dyslipidemia   . History of appendectomy   . H/O: hysterectomy   . Cyst of breast, right, benign solitary   . Inguinal hernia   . History of lumbosacral spine surgery   . Arthritis    Past Surgical History  Procedure Laterality Date  . Appendectomy    . Abdominal hysterectomy    . Laparotomy N/A 03/23/2013    Procedure: EXPLORATORY LAPAROTOMY;  Surgeon: Gwenyth Ober, MD;  Location: [place];  Service: General;  Laterality: N/A;  . Bowel resection N/A 03/23/2013    Procedure: SMALL BOWEL RESECTION;  Surgeon: Gwenyth Ober, MD;  Location: [place];  Service: General;  Laterality: N/A;  . Tracheostomy      feinstein  . Esophagogastroduodenoscopy N/A 04/01/2013    Procedure: ESOPHAGOGASTRODUODENOSCOPY (EGD);  Surgeon: Gwenyth Ober, MD;  Location: [place];  Service: General;  Laterality: N/A;  . Peg placement N/A 04/01/2013    Procedure: PERCUTANEOUS ENDOSCOPIC GASTROSTOMY (PEG) PLACEMENT;  Surgeon: Gwenyth Ober, MD;  Location: [facility] ENDOSCOPY;  Service: General;  Laterality: N/A;   Family History  Problem Relation Age of Onset  . Heart attack Father   . Lung  cancer Brother   . Healthy Sister    History  Substance Use Topics  . Smoking status: Former Smoker -- 0.00 packs/day    Types: Cigarettes    Quit date: 02/25/2003  . Smokeless tobacco: Never Used  . Alcohol Use: Yes     Comment: occ   OB History   Grav Para Term Preterm Abortions TAB SAB Ect Mult Living                 Review of Systems  All other systems reviewed and are negative.      Allergies  Penicillins  Home Medications   Current Outpatient Rx  Name  Route  Sig  Dispense  Refill  . amLODipine (NORVASC) 5 MG tablet   Oral   Take 1 tablet (5 mg total) by mouth daily.         Marland Kitchen aspirin 81 MG tablet   Oral   Take 81 mg by mouth daily.         Marland Kitchen atorvastatin (LIPITOR) 20 MG tablet   Oral   Take 20 mg by mouth daily.         . budesonide (PULMICORT) 0.25 MG/2ML nebulizer solution   Nebulization   Take 2 mLs (0.25 mg total) by nebulization every 6 (six) hours.   60 mL   12   . famotidine (PEPCID) 40 MG/5ML suspension   Per Tube   Place 2.5 mLs (20 mg  total) into feeding tube at bedtime.   50 mL   0   . furosemide (LASIX) 10 MG/ML injection   Intravenous   Inject 2 mLs (20 mg total) into the vein 2 (two) times daily.   4 mL   0   . levalbuterol (XOPENEX) 0.63 MG/3ML nebulizer solution   Nebulization   Take 3 mLs (0.63 mg total) by nebulization every 3 (three) hours as needed for wheezing or shortness of breath.   3 mL   12   . levofloxacin (LEVAQUIN) 750 MG tablet   Oral   Take 1 tablet (750 mg total) by mouth daily.   7 tablet   0   . loperamide (IMODIUM) 1 MG/5ML solution   Per Tube   Place 2 mg into feeding tube every 4 (four) hours as needed for diarrhea or loose stools.         . Nutritional Supplements (FEEDING SUPPLEMENT, VITAL AF 1.2 CAL,) LIQD   Per Tube   Place 50 mL/hr into feeding tube continuous.         . potassium chloride 20 MEQ/15ML (10%) solution   Per Tube   Place 15 mLs (20 mEq total) into feeding tube 3  (three) times daily.   500 mL   0   . ferrous sulfate 325 (65 FE) MG tablet   Oral   Take 325 mg by mouth daily with breakfast.          BP 159/95  Pulse 91  Resp 24  SpO2 96% Physical Exam  Nursing note and vitals reviewed. Constitutional: She appears well-developed and well-nourished.  HENT:  Head: Normocephalic and atraumatic.  Eyes: Conjunctivae and EOM are normal. Pupils are equal, round, and reactive to light.  Neck: Normal range of motion and phonation normal. Neck supple.  Cardiovascular: Intact distal pulses.   Pulmonary/Chest: Effort normal and breath sounds normal. She exhibits no tenderness.  Normal appearing tracheostomy and tracheostomy tube  Abdominal: Soft. She exhibits no distension. There is no tenderness. There is no guarding.  Musculoskeletal: Normal range of motion.  Neurological: She is alert. She exhibits normal muscle tone.  Skin: Skin is warm and dry.  Psychiatric: She has a normal mood and affect. Her behavior is normal.    ED Course  Procedures (including critical care time)    Record review: The charts from her admission and discharge. Today were reviewed. It appears that the intent was to change her to a cuff less tracheostomy tube. Prior to discharge, but that was not done. There does not appear to be any preocclusion to making this change.  Tracheostomy tube change: This was done by the respiratory technician under my supervision. The tube was changed without problem. The patient tolerated it well. Afterward, she was calm, comfortable, and had good air movement, bilateral lung fields, without audible rhonchi, or wheeze.  Findings discussed with the patient, who is alert and aware of her condition and has no expressed complaint      Labs Review Labs Reviewed - No data to display Imaging Review Dg Chest Port 1 View  04/22/2013   CLINICAL DATA:  Intubated  EXAM: PORTABLE CHEST - 1 VIEW  COMPARISON:  04/21/2013  FINDINGS: Tracheostomy  remains in unchanged position. Central venous catheter tip in the SVC also unchanged.  Mild bibasilar airspace consolidation is unchanged.  Cardiac enlargement without heart failure or edema.  IMPRESSION: Bibasilar atelectasis/ pneumonia, unchanged.   Electronically Signed   By: Franchot Gallo M.D.  On: 04/22/2013 07:35   Dg Chest Port 1 View  04/21/2013   CLINICAL DATA:  Tracheostomy placement.  EXAM: PORTABLE CHEST - 1 VIEW  COMPARISON:  DG CHEST 1V PORT dated 04/21/2013; DG CHEST 1V PORT dated 04/18/2013; DG CHEST 1V PORT dated 03/31/2013; DG CHEST 1V PORT dated 03/23/2013  FINDINGS: Tracheostomy tube noted with tip projected over the mid trachea. PICC line noted with tip at cavoatrial junction. Diffuse interstitial prominence noted throughout the lungs. Active interstitial lung disease cannot be excluded. Mild left base atelectasis and/or alveolar infiltrate noted. Cardiomegaly. Component of congestive heart failure cannot be excluded, no pulmonary venous congestion however is noted. No significant pleural effusion.  IMPRESSION: 1. Tracheostomy tube noted in good anatomic position. PICC line in stable position with tip at cavoatrial junction. 2. Stable diffuse severe interstitial prominence remains. Left lower lobe mild atelectatic changes versus alveolar infiltrate remains.   Electronically Signed   By: Marcello Moores  Register   On: 04/21/2013 09:15   Dg Chest Port 1 View  04/21/2013   CLINICAL DATA:  Removal of tracheostomy.  Basilar crackles.  EXAM: PORTABLE CHEST - 1 VIEW  COMPARISON:  DG CHEST 1V PORT dated 04/18/2013; DG CHEST 1V PORT dated 03/23/2013; DG CHEST 1V PORT dated 11/15/2009; DG CHEST 1V PORT dated 03/26/2013  FINDINGS: Interval removal of tracheostomy. Right-sided PICC line terminates at the mid to low as cc.  Moderate cardiomegaly with atherosclerosis in the transverse aorta. No pleural effusion or pneumothorax. Moderate diffuse interstitial thickening. Improved right base airspace disease. Patchy left  base airspace disease is not significantly changed. New since 11/15/2009.  IMPRESSION: Similar patchy left base airspace disease, suspicious for infection or aspiration.  Improved right base aeration.  Cardiomegaly with chronic interstitial thickening.   Electronically Signed   By: Abigail Miyamoto M.D.   On: 04/21/2013 07:18      MDM   Final diagnoses:  Encounter for tracheostomy tube change   Tracheostomy tube, change, uncomplicated   Nursing Notes Reviewed/ Care Coordinated Applicable Imaging Reviewed Interpretation of Laboratory Data incorporated into ED treatment  The patient appears reasonably screened and/or stabilized for discharge and I doubt any other medical condition or other [facility] requiring further screening, evaluation, or treatment in the ED at this time prior to discharge.  Plan: Home Medications- usual; Home Treatments- rest; return here if the recommended treatment, does not improve the symptoms; Recommended follow up- as scheduled     Richarda Blade, MD 04/22/13 2040

## 2013-04-22 NOTE — ED Notes (Signed)
Pt requested for this RN to contact her husband, re-checked telephone number with patient. No answer to phone callx2

## 2013-04-22 NOTE — ED Notes (Signed)
Pt still waiting on PTAR. Resting comfortably in bed.

## 2013-04-22 NOTE — ED Notes (Signed)
Pt sent via PTAR from [facility] with request to change Trach from a Cuffed Tracheostomy to a Non-cuffed Tracheostomy. Pt was discharged from [facility] earlier today and sent to [facility], Per EMS [employer] is unable to care for a patient with a cuffed trach. Pt denies pain or shortness of breath. Respiratory Therapist and Dr. Eulis Foster at bedside. Sutures to trach are intact, pt on 28% and 3L trach collar, pt in NAD.

## 2013-04-22 NOTE — Clinical Social Work Note (Signed)
CSW has completed discharge packet and placed on pt's chart. CSW to call for transport and has contacted daughter. Discharge summary has been faxed to [facility].  RN to call report to [employer] at [place], [place] Worker [phone]

## 2013-04-22 NOTE — Progress Notes (Signed)
Patient's trach changed to #4 cuffless from #6 cuffed. Patient remained hemodynamically stable during the procedure. Dr. Eulis Foster present for procedure. Positive color change on EZ-Cap and patient's airway suctioned. Patient on 28% trach collar at this time with Sp02=99%

## 2013-04-22 NOTE — Discharge Summary (Signed)
Physician Discharge Summary       Patient ID: Kristi Alexander MRN: [id number] DOB/AGE: [birth year] 80 y.o.  Admit date: 04/21/2013 Discharge date: 04/22/2013  Discharge Diagnoses:  Acute respiratory failure Accidental tracheostomy dislodgement HTN Adrenal insufficiency Hypokalemia  Detailed Hospital Course:  80 y.o. F recently admitted for SBO and underwent small bowel resection on 03/23/13. Pt was trached by DF on 1/28. She was discharged to [facility] on 04/19/13. Per EMS and EDP notes, RN was rounding this AM and noted that pt's trach was completely out/dislodged. EMS was dispatched and pt was brought to [facility]. EDP attempted to insert new trach but could not get through stoma as it was apparently already closed off. Patient underwent bronchoscopy and trach replacement in the ICU. She was monitored in the ICU following this procedure and did well. She was placed on tach collar after the trach was replaced and maintained her sats well. Note the patient had an echo with nml LV fn ,PA peak pressure: 21mm Hg. Respiratory culture returned with non-pathogenic oropharyngeal flora.   Discharge Plan by diagnoses   Discharge Plan:  Acute respiratory failure: resolved after trach replaced. Continue trach at nursing home. Patient will be sent to nursing home to complete a 7 day course of levaquin.  HTN: patient with elevated BP in hospital. Not on any home medications. Started amlodipine. Titrate as needed at SNF.  Adrenal insufficiency: cortisol low on admission. Would plan to recheck this in a few weeks.  Hypokalemia: continue home potassium replacement.    Significant Hospital tests/ studies/ interventions and procedures  Trach replaced 2/26  Discharge Exam: BP 164/84  Pulse 114  Temp(Src) 98 F (36.7 C) (Oral)  Resp 26  Ht 5\' 2"  (1.575 m)  Wt 60.9 kg (134 lb 4.2 oz)  BMI 24.55 kg/m2  SpO2 100%  General: Pleasant female, resting in bed, communicating well  Neuro:  alert, able to answer questions appropriately  HEENT: [place]/AT. Trach in place  Cardiovascular: RRR, no M/R/G.  Lungs: CTAB, no increased work of breathing  Abdomen: PEG site c/d/i, s, NT  Musculoskeletal: No gross deformities, no edema. R PICC noted.  Skin: Intact, warm, no rashes.   Labs at discharge Lab Results  Component Value Date   CREATININE 0.71 04/22/2013   BUN 16 04/22/2013   NA 140 04/22/2013   K 3.2* 04/22/2013   CL 99 04/22/2013   CO2 30 04/22/2013   Lab Results  Component Value Date   WBC 10.7* 04/22/2013   HGB 10.0* 04/22/2013   HCT 31.6* 04/22/2013   MCV 83.8 04/22/2013   PLT 300 04/22/2013   Lab Results  Component Value Date   ALT 26 04/21/2013   AST 29 04/21/2013   ALKPHOS 124* 04/21/2013   BILITOT 0.5 04/21/2013   Lab Results  Component Value Date   INR 1.01 04/21/2013   INR 1.02 04/01/2013   INR 1.27 03/28/2013    Current radiology studies Dg Chest Port 1 View  04/22/2013   CLINICAL DATA:  Intubated  EXAM: PORTABLE CHEST - 1 VIEW  COMPARISON:  04/21/2013  FINDINGS: Tracheostomy remains in unchanged position. Central venous catheter tip in the SVC also unchanged.  Mild bibasilar airspace consolidation is unchanged.  Cardiac enlargement without heart failure or edema.  IMPRESSION: Bibasilar atelectasis/ pneumonia, unchanged.   Electronically Signed   By: Franchot Gallo M.D.   On: 04/22/2013 07:35   Dg Chest Port 1 View  04/21/2013   CLINICAL DATA:  Tracheostomy placement.  EXAM:  PORTABLE CHEST - 1 VIEW  COMPARISON:  DG CHEST 1V PORT dated 04/21/2013; DG CHEST 1V PORT dated 04/18/2013; DG CHEST 1V PORT dated 03/31/2013; DG CHEST 1V PORT dated 03/23/2013  FINDINGS: Tracheostomy tube noted with tip projected over the mid trachea. PICC line noted with tip at cavoatrial junction. Diffuse interstitial prominence noted throughout the lungs. Active interstitial lung disease cannot be excluded. Mild left base atelectasis and/or alveolar infiltrate noted. Cardiomegaly. Component of  congestive heart failure cannot be excluded, no pulmonary venous congestion however is noted. No significant pleural effusion.  IMPRESSION: 1. Tracheostomy tube noted in good anatomic position. PICC line in stable position with tip at cavoatrial junction. 2. Stable diffuse severe interstitial prominence remains. Left lower lobe mild atelectatic changes versus alveolar infiltrate remains.   Electronically Signed   By: Marcello Moores  Register   On: 04/21/2013 09:15   Dg Chest Port 1 View  04/21/2013   CLINICAL DATA:  Removal of tracheostomy.  Basilar crackles.  EXAM: PORTABLE CHEST - 1 VIEW  COMPARISON:  DG CHEST 1V PORT dated 04/18/2013; DG CHEST 1V PORT dated 03/23/2013; DG CHEST 1V PORT dated 11/15/2009; DG CHEST 1V PORT dated 03/26/2013  FINDINGS: Interval removal of tracheostomy. Right-sided PICC line terminates at the mid to low as cc.  Moderate cardiomegaly with atherosclerosis in the transverse aorta. No pleural effusion or pneumothorax. Moderate diffuse interstitial thickening. Improved right base airspace disease. Patchy left base airspace disease is not significantly changed. New since 11/15/2009.  IMPRESSION: Similar patchy left base airspace disease, suspicious for infection or aspiration.  Improved right base aeration.  Cardiomegaly with chronic interstitial thickening.   Electronically Signed   By: Abigail Miyamoto M.D.   On: 04/21/2013 07:18    Disposition:  03-Skilled Nursing Facility   Future Appointments Provider Department Dept Phone   05/24/2013 11:00 AM Gwenyth Ober, [facility], [place]   07/19/2013 2:30 PM Dennie Bible, NP [facility] [phone]       Medication List    STOP taking these medications       ciprofloxacin 500 MG tablet  Commonly known as:  CIPRO      TAKE these medications       amLODipine 5 MG tablet  Commonly known as:  NORVASC  Take 1 tablet (5 mg total) by mouth daily.     aspirin 81 MG tablet  Take 81 mg by  mouth daily.     atorvastatin 20 MG tablet  Commonly known as:  LIPITOR  Take 20 mg by mouth daily.     budesonide 0.25 MG/2ML nebulizer solution  Commonly known as:  PULMICORT  Take 2 mLs (0.25 mg total) by nebulization every 6 (six) hours.     famotidine 40 MG/5ML suspension  Commonly known as:  PEPCID  Place 2.5 mLs (20 mg total) into feeding tube at bedtime.     feeding supplement (VITAL AF 1.2 CAL) Liqd  Place 50 mL/hr into feeding tube continuous.     ferrous sulfate 325 (65 FE) MG tablet  Take 325 mg by mouth daily with breakfast.     furosemide 10 MG/ML injection  Commonly known as:  LASIX  Inject 2 mLs (20 mg total) into the vein 2 (two) times daily.     levalbuterol 0.63 MG/3ML nebulizer solution  Commonly known as:  XOPENEX  Take 3 mLs (0.63 mg total) by nebulization every 3 (three) hours as needed for wheezing or shortness of breath.     levofloxacin  750 MG tablet  Commonly known as:  LEVAQUIN  Take 1 tablet (750 mg total) by mouth daily.     loperamide 1 MG/5ML solution  Commonly known as:  IMODIUM  Place 2 mg into feeding tube every 4 (four) hours as needed for diarrhea or loose stools.     potassium chloride 20 MEQ/15ML (10%) solution  Place 15 mLs (20 mEq total) into feeding tube 3 (three) times daily.         Discharged Condition: stable  Physician Statement:   The Patient was personally examined, the discharge assessment and plan has been personally reviewed and I agree with Tommi Rumps, MD assessment and plan. > 30 minutes of time have been dedicated to discharge assessment, planning and discharge instructions.   Signed: Rigoberto Noel

## 2013-04-23 ENCOUNTER — Telehealth: Payer: Self-pay | Admitting: Pulmonary Disease

## 2013-04-23 LAB — CULTURE, BLOOD (ROUTINE X 2)
CULTURE: NO GROWTH
Culture: NO GROWTH

## 2013-04-23 NOTE — Telephone Encounter (Signed)
Needs FU appt at trach clinic in 2-4 wks

## 2013-04-24 LAB — CULTURE, RESPIRATORY W GRAM STAIN

## 2013-04-24 LAB — CULTURE, RESPIRATORY

## 2013-04-25 NOTE — Telephone Encounter (Signed)
I spoke with RA as the trach clinic needs a reason for the appt.  Per RA, this is a f/u for eventual decannulation. I spoke with Sharyn Lull in [place].  Appt with trach clinic is scheduled for March 24 at 2 pm.  Pt will need to arrive at 1:45 pm.  If she goes via EMS, she will need to go through the ED for check in.   Called, spoke with pt's daughter, [name], at the # provided in pt's chart.  She is requesting I call [facility] to see how they can arrange transportation. I called [employer], spoke with [place].  She is aware of above appt date and time and instructions for check in for trach clinic appt.  Tammy will contact pt's daughter, [name], once they check pt's insurance regarding transportation to this appt. [name] is aware of this.  Will sign off as nothing further is needed from our office.

## 2013-04-27 LAB — CULTURE, BLOOD (ROUTINE X 2)
CULTURE: NO GROWTH
CULTURE: NO GROWTH
Culture: NO GROWTH
Culture: NO GROWTH

## 2013-05-14 ENCOUNTER — Encounter (HOSPITAL_COMMUNITY): Payer: Self-pay | Admitting: Emergency Medicine

## 2013-05-14 ENCOUNTER — Emergency Department (HOSPITAL_COMMUNITY): Payer: Medicare Other

## 2013-05-14 ENCOUNTER — Inpatient Hospital Stay (HOSPITAL_COMMUNITY)
Admission: EM | Admit: 2013-05-14 | Discharge: 2013-05-19 | DRG: 638 | Disposition: A | Discharge status: Skilled Nursing Facility | Payer: Medicare Other | Attending: Internal Medicine | Admitting: Internal Medicine

## 2013-05-14 DIAGNOSIS — A419 Sepsis, unspecified organism: Secondary | ICD-10-CM

## 2013-05-14 DIAGNOSIS — R739 Hyperglycemia, unspecified: Secondary | ICD-10-CM

## 2013-05-14 DIAGNOSIS — J9600 Acute respiratory failure, unspecified whether with hypoxia or hypercapnia: Secondary | ICD-10-CM

## 2013-05-14 DIAGNOSIS — R000 Tachycardia, unspecified: Secondary | ICD-10-CM | POA: Diagnosis present

## 2013-05-14 DIAGNOSIS — J9610 Chronic respiratory failure, unspecified whether with hypoxia or hypercapnia: Secondary | ICD-10-CM | POA: Diagnosis present

## 2013-05-14 DIAGNOSIS — J9620 Acute and chronic respiratory failure, unspecified whether with hypoxia or hypercapnia: Secondary | ICD-10-CM

## 2013-05-14 DIAGNOSIS — K56609 Unspecified intestinal obstruction, unspecified as to partial versus complete obstruction: Secondary | ICD-10-CM

## 2013-05-14 DIAGNOSIS — E860 Dehydration: Secondary | ICD-10-CM | POA: Diagnosis present

## 2013-05-14 DIAGNOSIS — E870 Hyperosmolality and hypernatremia: Secondary | ICD-10-CM | POA: Diagnosis present

## 2013-05-14 DIAGNOSIS — Z9049 Acquired absence of other specified parts of digestive tract: Secondary | ICD-10-CM

## 2013-05-14 DIAGNOSIS — N179 Acute kidney failure, unspecified: Secondary | ICD-10-CM | POA: Diagnosis present

## 2013-05-14 DIAGNOSIS — I5032 Chronic diastolic (congestive) heart failure: Secondary | ICD-10-CM

## 2013-05-14 DIAGNOSIS — D518 Other vitamin B12 deficiency anemias: Secondary | ICD-10-CM

## 2013-05-14 DIAGNOSIS — F0390 Unspecified dementia without behavioral disturbance: Secondary | ICD-10-CM | POA: Diagnosis present

## 2013-05-14 DIAGNOSIS — I498 Other specified cardiac arrhythmias: Secondary | ICD-10-CM

## 2013-05-14 DIAGNOSIS — I5040 Unspecified combined systolic (congestive) and diastolic (congestive) heart failure: Secondary | ICD-10-CM | POA: Diagnosis present

## 2013-05-14 DIAGNOSIS — R7309 Other abnormal glucose: Secondary | ICD-10-CM

## 2013-05-14 DIAGNOSIS — Z87891 Personal history of nicotine dependence: Secondary | ICD-10-CM

## 2013-05-14 DIAGNOSIS — Z79899 Other long term (current) drug therapy: Secondary | ICD-10-CM

## 2013-05-14 DIAGNOSIS — I10 Essential (primary) hypertension: Secondary | ICD-10-CM | POA: Diagnosis present

## 2013-05-14 DIAGNOSIS — I509 Heart failure, unspecified: Secondary | ICD-10-CM | POA: Diagnosis present

## 2013-05-14 DIAGNOSIS — R7303 Prediabetes: Secondary | ICD-10-CM | POA: Diagnosis present

## 2013-05-14 DIAGNOSIS — R1310 Dysphagia, unspecified: Secondary | ICD-10-CM | POA: Diagnosis present

## 2013-05-14 DIAGNOSIS — D649 Anemia, unspecified: Secondary | ICD-10-CM | POA: Diagnosis present

## 2013-05-14 DIAGNOSIS — Z931 Gastrostomy status: Secondary | ICD-10-CM

## 2013-05-14 DIAGNOSIS — Z930 Tracheostomy status: Secondary | ICD-10-CM

## 2013-05-14 DIAGNOSIS — Z7982 Long term (current) use of aspirin: Secondary | ICD-10-CM

## 2013-05-14 DIAGNOSIS — E785 Hyperlipidemia, unspecified: Secondary | ICD-10-CM | POA: Diagnosis present

## 2013-05-14 DIAGNOSIS — R6889 Other general symptoms and signs: Secondary | ICD-10-CM

## 2013-05-14 DIAGNOSIS — G2581 Restless legs syndrome: Secondary | ICD-10-CM

## 2013-05-14 DIAGNOSIS — R209 Unspecified disturbances of skin sensation: Secondary | ICD-10-CM

## 2013-05-14 DIAGNOSIS — E1101 Type 2 diabetes mellitus with hyperosmolarity with coma: Principal | ICD-10-CM | POA: Diagnosis present

## 2013-05-14 LAB — URINALYSIS, ROUTINE W REFLEX MICROSCOPIC
Bilirubin Urine: NEGATIVE
Glucose, UA: >1000 mg/dL — AB
Ketones, ur: NEGATIVE mg/dL
Leukocytes, UA: NEGATIVE
Nitrite: NEGATIVE
Protein, ur: NEGATIVE mg/dL
SPECIFIC GRAVITY, URINE: 1.03 (ref 1.005–1.030)
UROBILINOGEN UA: 0.2 mg/dL (ref 0.0–1.0)
pH: 6 (ref 5.0–8.0)

## 2013-05-14 LAB — CBC
HCT: 41.8 % (ref 36.0–46.0)
Hemoglobin: 11.8 g/dL — ABNORMAL LOW (ref 12.0–15.0)
MCH: 27.9 pg (ref 26.0–34.0)
MCHC: 28.2 g/dL — AB (ref 30.0–36.0)
MCV: 98.8 fL (ref 78.0–100.0)
PLATELETS: 354 10*3/uL (ref 150–400)
RBC: 4.23 MIL/uL (ref 3.87–5.11)
RDW: 23.2 % — ABNORMAL HIGH (ref 11.5–15.5)
WBC: 15.1 10*3/uL — ABNORMAL HIGH (ref 4.0–10.5)

## 2013-05-14 LAB — COMPREHENSIVE METABOLIC PANEL
ALT: 24 U/L (ref 0–35)
AST: 20 U/L (ref 0–37)
Albumin: 3.3 g/dL — ABNORMAL LOW (ref 3.5–5.2)
Alkaline Phosphatase: 220 U/L — ABNORMAL HIGH (ref 39–117)
BUN: 90 mg/dL — ABNORMAL HIGH (ref 6–23)
CALCIUM: 10.1 mg/dL (ref 8.4–10.5)
CO2: 30 mEq/L (ref 19–32)
CREATININE: 1.91 mg/dL — AB (ref 0.50–1.10)
Chloride: 108 mEq/L (ref 96–112)
GFR calc non Af Amer: 23 mL/min — ABNORMAL LOW (ref >90)
GFR, EST AFRICAN AMERICAN: 26 mL/min — AB (ref >90)
Glucose, Bld: 1457 mg/dL (ref 70–99)
Potassium: 4.8 mEq/L (ref 3.7–5.3)
Sodium: 155 mEq/L — ABNORMAL HIGH (ref 137–147)
Total Bilirubin: 0.5 mg/dL (ref 0.3–1.2)
Total Protein: 8 g/dL (ref 6.0–8.3)

## 2013-05-14 LAB — KETONES, QUALITATIVE: ACETONE BLD: NEGATIVE

## 2013-05-14 LAB — URINE MICROSCOPIC-ADD ON

## 2013-05-14 LAB — CBG MONITORING, ED: Glucose-Capillary: >600 mg/dL (ref 70–99)

## 2013-05-14 MED ORDER — INSULIN REGULAR HUMAN 100 UNIT/ML IJ SOLN
INTRAMUSCULAR | Status: DC
  Administered 2013-05-15: 17.6 [IU]/h via INTRAVENOUS
  Filled 2013-05-14: qty 1

## 2013-05-14 MED ORDER — DEXTROSE-NACL 5-0.45 % IV SOLN
INTRAVENOUS | Status: DC
  Administered 2013-05-15: 07:00:00 via INTRAVENOUS

## 2013-05-14 MED ORDER — SODIUM CHLORIDE 0.9 % IV BOLUS (SEPSIS)
500.0000 mL | Freq: Once | INTRAVENOUS | Status: AC
  Administered 2013-05-14: 500 mL via INTRAVENOUS

## 2013-05-14 MED ORDER — SODIUM CHLORIDE 0.9 % IV SOLN: 1000.0000 mL | INTRAVENOUS | Status: DC

## 2013-05-14 MED ORDER — SODIUM CHLORIDE 0.45 % IV SOLN
INTRAVENOUS | Status: DC
  Administered 2013-05-15: via INTRAVENOUS

## 2013-05-14 MED ORDER — SODIUM CHLORIDE 0.9 % IV SOLN: INTRAVENOUS | Status: DC

## 2013-05-14 MED ORDER — INSULIN REGULAR BOLUS VIA INFUSION
0.0000 [IU] | Freq: Three times a day (TID) | INTRAVENOUS | Status: DC
  Filled 2013-05-14: qty 10

## 2013-05-14 MED ORDER — SODIUM CHLORIDE 0.9 % IV SOLN
INTRAVENOUS | Status: DC
  Administered 2013-05-14: 5.4 [IU]/h via INTRAVENOUS
  Filled 2013-05-14: qty 1

## 2013-05-14 MED ORDER — AMLODIPINE BESYLATE 5 MG PO TABS
5.0000 mg | ORAL_TABLET | Freq: Every day | ORAL | Status: DC
  Administered 2013-05-15 – 2013-05-19 (×5): 5 mg
  Filled 2013-05-14 (×5): qty 1

## 2013-05-14 MED ORDER — DEXTROSE 50 % IV SOLN
25.0000 mL | INTRAVENOUS | Status: DC | PRN
  Administered 2013-05-15 (×2): 25 mL via INTRAVENOUS
  Administered 2013-05-16: 14 mL via INTRAVENOUS
  Filled 2013-05-14 (×2): qty 50

## 2013-05-14 MED ORDER — ATORVASTATIN CALCIUM 20 MG PO TABS
20.0000 mg | ORAL_TABLET | Freq: Every day | ORAL | Status: DC
  Administered 2013-05-16 – 2013-05-18 (×3): 20 mg via ORAL
  Filled 2013-05-14 (×7): qty 1

## 2013-05-14 MED ORDER — SODIUM CHLORIDE 0.9 % IV SOLN: 1000.0000 mL | Freq: Once | INTRAVENOUS | Status: DC

## 2013-05-14 MED ORDER — ASPIRIN 81 MG PO CHEW
81.0000 mg | CHEWABLE_TABLET | Freq: Every day | ORAL | Status: DC
  Administered 2013-05-15 – 2013-05-19 (×5): 81 mg via ORAL
  Filled 2013-05-14 (×6): qty 1

## 2013-05-14 MED ORDER — FERROUS SULFATE 325 (65 FE) MG PO TABS
325.0000 mg | ORAL_TABLET | Freq: Every day | ORAL | Status: DC
  Administered 2013-05-15 – 2013-05-19 (×5): 325 mg via ORAL
  Filled 2013-05-14 (×7): qty 1

## 2013-05-14 MED ORDER — DEXTROSE-NACL 5-0.45 % IV SOLN: INTRAVENOUS | Status: DC

## 2013-05-14 MED ORDER — FAMOTIDINE 40 MG/5ML PO SUSR
20.0000 mg | Freq: Every day | ORAL | Status: DC
  Administered 2013-05-15 – 2013-05-18 (×5): 20 mg
  Filled 2013-05-14 (×7): qty 2.5

## 2013-05-14 MED ORDER — BUDESONIDE 0.25 MG/2ML IN SUSP
0.2500 mg | Freq: Four times a day (QID) | RESPIRATORY_TRACT | Status: DC
  Administered 2013-05-15 – 2013-05-19 (×18): 0.25 mg via RESPIRATORY_TRACT
  Filled 2013-05-14 (×26): qty 2

## 2013-05-14 MED ORDER — OSMOLITE 1.2 CAL PO LIQD: 1000.0000 mL | ORAL | Status: DC

## 2013-05-14 NOTE — ED Provider Notes (Addendum)
CSN: [id number]     Arrival date & time 05/14/13  1847 History   First MD Initiated Contact with Patient 05/14/13 1900     Chief Complaint  Patient presents with  . Hyperglycemia     (Consider location/radiation/quality/duration/timing/severity/associated sxs/prior Treatment) Patient is a 80 y.o. female presenting with hyperglycemia. The history is provided by the patient, the EMS personnel and the nursing home.  Hyperglycemia Associated symptoms: confusion   Associated symptoms: no abdominal pain, no chest pain, no dysuria, no fever, no shortness of breath and no vomiting   pt from [facility], hx htn, trach, presents from [facility] w high blood sugar, was also noted there w altered mental status. Per ecf paperwork, no hx diabetes, although during hospitalizations in past few months pt noted w elevated blood sugars. Pt awake and alert. Denies any c/o. No pain. No headache. No cp. No abd pain. Denies cough or uri c/o. No sob. No nvd. No dysuria or gu c/o. Pt unaware of change in meds or new meds. Gets tube feeds via peg, unaware of change in tube feeds. Pt denies fever or chills. Denies polyuria or polydipsia. Pt unaware of any recent wt change.      Past Medical History  Diagnosis Date  . Restless leg syndrome   . Hypertension   . Dyslipidemia   . History of appendectomy   . H/O: hysterectomy   . Cyst of breast, right, benign solitary   . Inguinal hernia   . History of lumbosacral spine surgery   . Arthritis    Past Surgical History  Procedure Laterality Date  . Appendectomy    . Abdominal hysterectomy    . Laparotomy N/A 03/23/2013    Procedure: EXPLORATORY LAPAROTOMY;  Surgeon: Gwenyth Ober, MD;  Location: [place];  Service: General;  Laterality: N/A;  . Bowel resection N/A 03/23/2013    Procedure: SMALL BOWEL RESECTION;  Surgeon: Gwenyth Ober, MD;  Location: [place];  Service: General;  Laterality: N/A;  . Tracheostomy      feinstein  . Esophagogastroduodenoscopy N/A 04/01/2013   Procedure: ESOPHAGOGASTRODUODENOSCOPY (EGD);  Surgeon: Gwenyth Ober, MD;  Location: [place];  Service: General;  Laterality: N/A;  . Peg placement N/A 04/01/2013    Procedure: PERCUTANEOUS ENDOSCOPIC GASTROSTOMY (PEG) PLACEMENT;  Surgeon: Gwenyth Ober, MD;  Location: [facility] END[facility];  Service: General;  Laterality: N/A;   Family History  Problem Relation Age of Onset  . Heart attack Father   . Lung cancer Brother   . Healthy Sister    History  Substance Use Topics  . Smoking status: Former Smoker -- 0.00 packs/day    Types: Cigarettes    Quit date: 02/25/2003  . Smokeless tobacco: Never Used  . Alcohol Use: Yes     Comment: occ   OB History   Grav Para Term Preterm Abortions TAB SAB Ect Mult Living                 Review of Systems  Constitutional: Negative for fever and chills.  HENT: Negative for sore throat.   Eyes: Negative for redness.  Respiratory: Negative for shortness of breath.   Cardiovascular: Negative for chest pain.  Gastrointestinal: Negative for vomiting, abdominal pain and diarrhea.  Genitourinary: Negative for dysuria and flank pain.  Musculoskeletal: Negative for back pain and neck pain.  Skin: Negative for rash.  Neurological: Negative for headaches.  Hematological: Does not bruise/bleed easily.  Psychiatric/Behavioral: Positive for confusion.      Allergies  Penicillins  Home Medications   Current Outpatient Rx  Name  Route  Sig  Dispense  Refill  . amLODipine (NORVASC) 5 MG tablet   Oral   Take 1 tablet (5 mg total) by mouth daily.         Marland Kitchen aspirin 81 MG tablet   Oral   Take 81 mg by mouth daily.         Marland Kitchen atorvastatin (LIPITOR) 20 MG tablet   Oral   Take 20 mg by mouth daily.         . budesonide (PULMICORT) 0.25 MG/2ML nebulizer solution   Nebulization   Take 2 mLs (0.25 mg total) by nebulization every 6 (six) hours.   60 mL   12   . famotidine (PEPCID) 40 MG/5ML suspension   Per Tube   Place 2.5 mLs (20 mg total)  into feeding tube at bedtime.   50 mL   0   . ferrous sulfate 325 (65 FE) MG tablet   Oral   Take 325 mg by mouth daily with breakfast.         . furosemide (LASIX) 10 MG/ML injection   Intravenous   Inject 2 mLs (20 mg total) into the vein 2 (two) times daily.   4 mL   0   . levalbuterol (XOPENEX) 0.63 MG/3ML nebulizer solution   Nebulization   Take 3 mLs (0.63 mg total) by nebulization every 3 (three) hours as needed for wheezing or shortness of breath.   3 mL   12   . levofloxacin (LEVAQUIN) 750 MG tablet   Oral   Take 1 tablet (750 mg total) by mouth daily.   7 tablet   0   . loperamide (IMODIUM) 1 MG/5ML solution   Per Tube   Place 2 mg into feeding tube every 4 (four) hours as needed for diarrhea or loose stools.         . Nutritional Supplements (FEEDING SUPPLEMENT, VITAL AF 1.2 CAL,) LIQD   Per Tube   Place 50 mL/hr into feeding tube continuous.         . potassium chloride 20 MEQ/15ML (10%) solution   Per Tube   Place 15 mLs (20 mEq total) into feeding tube 3 (three) times daily.   500 mL   0    BP 155/84  Pulse 127  Temp(Src) 98 F (36.7 C) (Oral)  Resp 24  SpO2 94% Physical Exam  Nursing note and vitals reviewed. Constitutional:  Frail elderly female, tachycardic.   HENT:  Mildly dry mm.   Eyes: Conjunctivae are normal. No scleral icterus.  Neck: Neck supple. No tracheal deviation present.  Trach site without sign of infection.  No stiffness or rigidity.   Cardiovascular: Regular rhythm, normal heart sounds and intact distal pulses.   Tachycardic.   Pulmonary/Chest: Effort normal and breath sounds normal. No respiratory distress.  Abdominal: Soft. Normal appearance and bowel sounds are normal. She exhibits no distension.  Genitourinary:  No cva tenderness  Musculoskeletal: She exhibits no edema and no tenderness.  Neurological: She is alert. No cranial nerve deficit.  Pt mildly confused re recent events, insight into current  issues/symptoms. Is oriented to person and place, not to day or date.  Skin: Skin is warm and dry. No rash noted.  Psychiatric:  Sl slow to respond, alert, pleasant.     ED Course  Procedures (including critical care time)   Results for orders placed during the hospital encounter of 05/14/13  CBC  Result Value Ref Range   WBC 15.1 (*) 4.0 - 10.5 K/uL   RBC 4.23  3.87 - 5.11 MIL/uL   Hemoglobin 11.8 (*) 12.0 - 15.0 g/dL   HCT 41.8  36.0 - 46.0 %   MCV 98.8  78.0 - 100.0 fL   MCH 27.9  26.0 - 34.0 pg   MCHC 28.2 (*) 30.0 - 36.0 g/dL   RDW 23.2 (*) 11.5 - 15.5 %   Platelets 354  150 - 400 K/uL  COMPREHENSIVE METABOLIC PANEL      Result Value Ref Range   Sodium 155 (*) 137 - 147 mEq/L   Potassium 4.8  3.7 - 5.3 mEq/L   Chloride 108  96 - 112 mEq/L   CO2 30  19 - 32 mEq/L   Glucose, Bld 1457 (*) 70 - 99 mg/dL   BUN 90 (*) 6 - 23 mg/dL   Creatinine, Ser 1.91 (*) 0.50 - 1.10 mg/dL   Calcium 10.1  8.4 - 10.5 mg/dL   Total Protein 8.0  6.0 - 8.3 g/dL   Albumin 3.3 (*) 3.5 - 5.2 g/dL   AST 20  0 - 37 U/L   ALT 24  0 - 35 U/L   Alkaline Phosphatase 220 (*) 39 - 117 U/L   Total Bilirubin 0.5  0.3 - 1.2 mg/dL   GFR calc non Af Amer 23 (*) >90 mL/min   GFR calc Af Amer 26 (*) >90 mL/min  CBG MONITORING, ED      Result Value Ref Range   Glucose-Capillary >600 (*) 70 - 99 mg/dL   Dg Chest Port 1 View  04/22/2013   CLINICAL DATA:  Intubated  EXAM: PORTABLE CHEST - 1 VIEW  COMPARISON:  04/21/2013  FINDINGS: Tracheostomy remains in unchanged position. Central venous catheter tip in the SVC also unchanged.  Mild bibasilar airspace consolidation is unchanged.  Cardiac enlargement without heart failure or edema.  IMPRESSION: Bibasilar atelectasis/ pneumonia, unchanged.   Electronically Signed   By: Franchot Gallo M.D.   On: 04/22/2013 07:35   Dg Chest Port 1 View  04/21/2013   CLINICAL DATA:  Tracheostomy placement.  EXAM: PORTABLE CHEST - 1 VIEW  COMPARISON:  DG CHEST 1V PORT dated  04/21/2013; DG CHEST 1V PORT dated 04/18/2013; DG CHEST 1V PORT dated 03/31/2013; DG CHEST 1V PORT dated 03/23/2013  FINDINGS: Tracheostomy tube noted with tip projected over the mid trachea. PICC line noted with tip at cavoatrial junction. Diffuse interstitial prominence noted throughout the lungs. Active interstitial lung disease cannot be excluded. Mild left base atelectasis and/or alveolar infiltrate noted. Cardiomegaly. Component of congestive heart failure cannot be excluded, no pulmonary venous congestion however is noted. No significant pleural effusion.  IMPRESSION: 1. Tracheostomy tube noted in good anatomic position. PICC line in stable position with tip at cavoatrial junction. 2. Stable diffuse severe interstitial prominence remains. Left lower lobe mild atelectatic changes versus alveolar infiltrate remains.   Electronically Signed   By: Marcello Moores  Register   On: 04/21/2013 09:15   Dg Chest Port 1 View  04/21/2013   CLINICAL DATA:  Removal of tracheostomy.  Basilar crackles.  EXAM: PORTABLE CHEST - 1 VIEW  COMPARISON:  DG CHEST 1V PORT dated 04/18/2013; DG CHEST 1V PORT dated 03/23/2013; DG CHEST 1V PORT dated 11/15/2009; DG CHEST 1V PORT dated 03/26/2013  FINDINGS: Interval removal of tracheostomy. Right-sided PICC line terminates at the mid to low as cc.  Moderate cardiomegaly with atherosclerosis in the transverse aorta. No pleural effusion or  pneumothorax. Moderate diffuse interstitial thickening. Improved right base airspace disease. Patchy left base airspace disease is not significantly changed. New since 11/15/2009.  IMPRESSION: Similar patchy left base airspace disease, suspicious for infection or aspiration.  Improved right base aeration.  Cardiomegaly with chronic interstitial thickening.   Electronically Signed   By: Abigail Miyamoto M.D.   On: 04/21/2013 07:18   Dg Chest Port 1 View  04/18/2013   CLINICAL DATA:  Coughing.  EXAM: PORTABLE CHEST - 1 VIEW  COMPARISON:  04/17/2013 and 11/15/2009.   FINDINGS: Tracheostomy tube tip midline. Right central line tip mid superior vena cava level.  Slight decrease in degree of pulmonary edema. Underlying chronic lung changes.  Right base subsegmental atelectasis suspected. Subtle infiltrate not excluded. Appearance without significant change.  Cardiomegaly.  Tortuous aorta.  Left shoulder joint degenerative changes.  IMPRESSION: Slight decrease in degree of pulmonary edema.  Right base parenchymal changes may represent atelectasis. Subtle infiltrate not excluded.   Electronically Signed   By: Chauncey Cruel M.D.   On: 04/18/2013 07:31   Dg Chest Port 1 View  04/17/2013   CLINICAL DATA:  Fever. Acute respiratory failure. Sepsis. Acute renal failure.  EXAM: PORTABLE CHEST - 1 VIEW  COMPARISON:  04/16/2013  FINDINGS: Tracheostomy tube and right arm PICC line remain in appropriate position. Diffuse interstitial infiltrates are again seen, suspicious for diffuse pulmonary edema. There is decreased atelectasis seen in lung bases. Small bilateral pleural effusions cannot be excluded. Cardiomegaly is stable.  IMPRESSION: Stable cardiomegaly and diffuse interstitial edema pattern.  Mild decrease in bibasilar atelectasis.   Electronically Signed   By: Earle Gell M.D.   On: 04/17/2013 07:58   Dg Chest Port 1 View  04/16/2013   CLINICAL DATA:  Respiratory failure.  EXAM: PORTABLE CHEST - 1 VIEW  COMPARISON:  04/12/2013  FINDINGS: Tracheostomy tube overlies the airway. Right PICC remains in place with tip overlying the mid SVC. The cardiac silhouette remains enlarged and partially obscured. Lungs remain hypoinflated with similar appearance of bibasilar opacities. Mild degenerative changes are present in the thoracic spine.  IMPRESSION: Bibasilar lung opacities without significant interval change.   Electronically Signed   By: Logan Bores   On: 04/16/2013 08:24      EKG Interpretation   Date/Time:  Saturday May 14 2013 19:05:26 EDT Ventricular Rate:  123 PR  Interval:  133 QRS Duration: 76 QT Interval:  338 QTC Calculation: 483 R Axis:   -6 Text Interpretation:  Sinus tachycardia Nonspecific T abnormalities,  lateral leads Borderline prolonged QT interval Confirmed by Ashok Cordia  MD,  Lennette Bihari ([id number]) on 05/14/2013 7:21:34 PM      MDM   Iv ns bolus. Labs. Continuous pulse ox and monitor.   Reviewed nursing notes and prior charts for additional history.   Additional ns bolus.  Recheck abd soft nt.  Pt denies any change/new symptoms from prior eval.  Blood glucose severely high, 1457.  Additional ivf, insulin iv drip via glucose stabilizer.  Med service called to admit.  CRITICAL CARE  RE acute alteration of mental status, severe hyperglycemia, tachycardia, confusion, dehydration, acute kidney injury Performed by: Mirna Mires Total critical care time: 40 Critical care time was exclusive of separately billable procedures and treating other patients. Critical care was necessary to treat or prevent imminent or life-threatening deterioration. Critical care was time spent personally by me on the following activities: development of treatment plan with patient and/or surrogate as well as nursing, discussions with consultants, evaluation of patient's response  to treatment, examination of patient, obtaining history from patient or surrogate, ordering and performing treatments and interventions, ordering and review of laboratory studies, ordering and review of radiographic studies, pulse oximetry and re-evaluation of patient's condition.        Mirna Mires, MD 05/14/13 2114

## 2013-05-14 NOTE — ED Notes (Signed)
IV team and Admitting MD at bedside

## 2013-05-14 NOTE — ED Notes (Signed)
Checked blood sugar it was greater then 600 it read high

## 2013-05-14 NOTE — ED Notes (Signed)
Per EMS: Patient was last seen well at 1900 yesterday per night shift staff. Patient received from [facility]. Staff complained patient had a change in mental status. Per EMS, staff stated she was not a diabetic, but per paperwork, patient is a diabetic. Patient has a Therapist, nutritional, Feeding tube and PRN Homecare Oxygen. Patient is oriented to person, place, and event. Disoriented to time. Patient repetitively gives answers despite different questions. CBG read HIGH (Above 600). Patient denies pain, nausea, vomiting, no breathing abnormality.

## 2013-05-14 NOTE — ED Notes (Signed)
Lab notified Tomothy Eddins,RN of critical glucose of 1457.

## 2013-05-14 NOTE — H&P (Signed)
[facility] History and Physical  Kristi Alexander ZOX:[id number] DOB: March 29, 1925 DOA: 05/14/2013  Referring physician: EDP PCP: [name], MD/ [facility]: Dr. Reesa Chew Specialists:    Chief Complaint: Lethargy  HPI: Kristi Alexander is a 80 y.o. female residing in the [facility] for Rehab therapy after she was hospitalized from 03/22/2013 until 04/19/2013 for a High Grade SBO of which she underwent an Exploratory Laparotomy with Small Bowel Resection on 03/23/2013 and developed Respiratory Failure requiring Tracheostomy placement and PEG tube placement who was sent from the SNF due to worsening confusion and progressive lethargy over the last 3 days.   She was evaluated in the ED and was found to have a glucose level of 1457, and a sodium level of 155. And a BUN/Cr of 90/1.91.  She was admininistered IVFs and started on the [facility] and referred for medical admission.         Review of Systems: Unable to Obtain from the Patient  Past Medical History  Diagnosis Date  . Restless leg syndrome   . Hypertension   . Dyslipidemia   . History of appendectomy   . H/O: hysterectomy   . Cyst of breast, right, benign solitary   . Inguinal hernia   . History of lumbosacral spine surgery   . Arthritis   . HHNC (hyperglycemic hyperosmolar nonketotic coma) 05/14/2013      Past Surgical History  Procedure Laterality Date  . Appendectomy    . Abdominal hysterectomy    . Laparotomy N/A 03/23/2013    Procedure: EXPLORATORY LAPAROTOMY;  Surgeon: Gwenyth Ober, MD;  Location: [place];  Service: General;  Laterality: N/A;  . Bowel resection N/A 03/23/2013    Procedure: SMALL BOWEL RESECTION;  Surgeon: Gwenyth Ober, MD;  Location: [place];  Service: General;  Laterality: N/A;  . Tracheostomy      feinstein  . Esophagogastroduodenoscopy N/A 04/01/2013    Procedure: ESOPHAGOGASTRODUODENOSCOPY (EGD);  Surgeon: Gwenyth Ober, MD;  Location: [place];   Service: General;  Laterality: N/A;  . Peg placement N/A 04/01/2013    Procedure: PERCUTANEOUS ENDOSCOPIC GASTROSTOMY (PEG) PLACEMENT;  Surgeon: Gwenyth Ober, MD;  Location: [place];  Service: General;  Laterality: N/A;       Prior to Admission medications   Medication Sig Start Date End Date Taking? Authorizing Provider  amLODipine (NORVASC) 5 MG tablet 5 mg by Gastric Tube route daily.   Yes Historical Provider, MD  aspirin 81 MG tablet 81 mg by Gastric Tube route daily.    Yes Historical Provider, MD  atorvastatin (LIPITOR) 20 MG tablet 20 mg by Gastric Tube route every evening. 5pm 01/04/13  Yes Historical Provider, MD  budesonide (PULMICORT) 0.25 MG/2ML nebulizer solution 0.25 mg by Tracheal Tube route every 6 (six) hours.   Yes Historical Provider, MD  famotidine (PEPCID) 40 MG/5ML suspension 20 mg by Gastric Tube route at bedtime.   Yes Historical Provider, MD  ferrous sulfate 325 (65 FE) MG tablet 325 mg by Gastric Tube route daily.    Yes Historical Provider, MD  furosemide (LASIX) 10 MG/ML solution 20 mg by Gastric Tube route 2 (two) times daily.   Yes Historical Provider, MD  Nutritional Supplements (FEEDING SUPPLEMENT, OSMOLITE 1.2 CAL,) LIQD Place 1,000 mLs into feeding tube continuous. 85 ml/hr 12 noon to 8am (allow 4 hours downtime for therapy)   Yes Historical Provider, MD  potassium chloride 20 MEQ/15ML (10%) solution 20 mEq by Gastric Tube route 3 (three)  times daily. 9am, 2pm, 9pm   Yes Historical Provider, MD  levofloxacin (LEVAQUIN) 750 MG tablet 750 mg by Gastric Tube route daily. 7 day course completed 05/04/13    Historical Provider, MD      Allergies  Allergen Reactions  . Penicillins Swelling     Social History:  reports that she quit smoking about 10 years ago. Her smoking use included Cigarettes. She smoked 0.00 packs per day. She has never used smokeless tobacco. She reports that she drinks alcohol. She reports that she does not use illicit drugs.      Family History  Problem Relation Age of Onset  . Heart attack Father   . Lung cancer Brother   . Healthy Sister        Physical Exam:  GEN:  Pleasant and Confused Elderly 80 y.o. African American female examined  and in no acute distress; cooperative with exam Filed Vitals:   05/14/13 1903 05/14/13 2015 05/14/13 2100 05/14/13 2207  BP: 155/84 150/113 149/100 179/81  Pulse: 127 119 117 110  Temp: 98 F (36.7 C)     TempSrc: Oral     Resp: 24 39 24 18  SpO2: 94% 97% 94% 100%   Blood pressure 179/81, pulse 110, temperature 98 F (36.7 C), temperature source Oral, resp. rate 18, SpO2 100.00%. PSYCH: She is alert and oriented x1; does not appear anxious does not appear depressed; affect is normal HEENT: Normocephalic and Atraumatic, Mucous membranes pink; PERRLA; EOM intact; Fundi:  Benign;  No scleral icterus, Nares: Patent, Oropharynx: Clear, Edentulous, Neck:  + Tracheostomy,   FROM, no JVD; Breasts:: Not examined CHEST WALL: No tenderness CHEST: Normal respiration, clear to auscultation bilaterally HEART: Regular rate and rhythm; no murmurs rubs or gallops BACK: No kyphosis or scoliosis; no CVA tenderness ABDOMEN: Positive Bowel Sounds, Scaphoid, soft non-tender; no masses, no organomegaly.   Rectal Exam: Not done EXTREMITIES: No cyanosis, clubbing or edema; no ulcerations. Genitalia: not examined PULSES: 2+ and symmetric SKIN: Normal hydration no rash or ulceration CNS:  Alert and oriented x 1,  No focal Deficits Vascular: pulses palpable throughout    Labs on Admission:  Basic Metabolic Panel:  Recent Labs Lab 05/14/13 1918  NA 155*  K 4.8  CL 108  CO2 30  GLUCOSE 1457*  BUN 90*  CREATININE 1.91*  CALCIUM 10.1   Liver Function Tests:  Recent Labs Lab 05/14/13 1918  AST 20  ALT 24  ALKPHOS 220*  BILITOT 0.5  PROT 8.0  ALBUMIN 3.3*   No results found for this basename: LIPASE, AMYLASE,  in the last 168 hours No results found for this basename:  AMMONIA,  in the last 168 hours CBC:  Recent Labs Lab 05/14/13 1918  WBC 15.1*  HGB 11.8*  HCT 41.8  MCV 98.8  PLT 354   Cardiac Enzymes: No results found for this basename: CKTOTAL, CKMB, CKMBINDEX, TROPONINI,  in the last 168 hours  BNP (last 3 results)  Recent Labs  04/18/13 0610 04/21/13 0701 04/21/13 1015  PROBNP 3112.0* 8813.0* 10944.0*   CBG:  Recent Labs Lab 05/14/13 1912  GLUCAP >600*    Radiological Exams on Admission: Dg Chest 2 View  05/14/2013   CLINICAL DATA:  Altered mental status. Hypertension. Acute on chronic respiratory failure.  EXAM: CHEST  2 VIEW  COMPARISON:  04/22/2013  FINDINGS: Tracheostomy tube is seen in place. Mild cardiomegaly is stable. Mild diffuse interstitial prominence is stable. Decreased infiltrate or atelectasis seen at the lung bases since  prior study. No new or worsening of pulmonary infiltrate seen. No evidence of pleural effusion. Ectasia of the thoracic aorta is stable.  IMPRESSION: Stable cardiomegaly and diffuse interstitial prominence. Resolving bibasilar atelectasis or infiltrates.   Electronically Signed   By: Earle Gell M.D.   On: 05/14/2013 21:14     EKG: Independently reviewed.   Sinus Tachycardia rate 123 No Acute S-T changes   Assessment/Plan:   80 y.o. female with  Principal Problem:   HHNC (hyperglycemic hyperosmolar nonketotic coma) Active Problems:   Hyperglycemia   Acute hypernatremia   Dehydration   Sinus tachycardia   Acute renal failure   Chronic respiratory failure      1.   HHNC/Hyperglycemia-    Admitted to [facility] for monitoring,  Placed on the Glucostabilizer IV Insulin Drip, and to transition to Lantus RX with SSI coverage.   Monitor Electrolytes.    2.   Hypernatremia/Dehydration/ ARF-   Gentle IVFs, Monitor Electrolytes, and BUN/Cr.    3.   Sinus Tachycardia- due to Dehydration, Rehydrate.    4.   Chronic Respiratory Failure-  Continue on Trach Collar at 28% Fio2, and monitor O2  sats continuously.     5.    PEG Tube CARE, and Tracheostomy CARE.    6.     Nutrition Consult for appropriate Tube Feedings, and for Free Water supplementation and for Nutritional Needs.     7.     DVT prophylaxis with Lovenox.         Code Status:  FULL CODE Family Communication:     Disposition Plan:       Admit to Stepdown  Time spent:  25 MInutes  [facility] Pager [phone]  If 7PM-7AM, please contact night-coverage [email or web address] Password [facility] 05/14/2013, 10:28 PM

## 2013-05-15 DIAGNOSIS — I5032 Chronic diastolic (congestive) heart failure: Secondary | ICD-10-CM | POA: Diagnosis present

## 2013-05-15 DIAGNOSIS — I5040 Unspecified combined systolic (congestive) and diastolic (congestive) heart failure: Secondary | ICD-10-CM | POA: Diagnosis present

## 2013-05-15 LAB — CBC
HEMATOCRIT: 38.9 % (ref 36.0–46.0)
Hemoglobin: 11.6 g/dL — ABNORMAL LOW (ref 12.0–15.0)
MCH: 27.3 pg (ref 26.0–34.0)
MCHC: 29.8 g/dL — ABNORMAL LOW (ref 30.0–36.0)
MCV: 91.5 fL (ref 78.0–100.0)
Platelets: 273 10*3/uL (ref 150–400)
RBC: 4.25 MIL/uL (ref 3.87–5.11)
RDW: 21.5 % — ABNORMAL HIGH (ref 11.5–15.5)
WBC: 20.9 10*3/uL — ABNORMAL HIGH (ref 4.0–10.5)

## 2013-05-15 LAB — BASIC METABOLIC PANEL
BUN: 87 mg/dL — AB (ref 6–23)
BUN: 88 mg/dL — ABNORMAL HIGH (ref 6–23)
BUN: 86 mg/dL — ABNORMAL HIGH (ref 6–23)
CHLORIDE: 129 meq/L — AB (ref 96–112)
CO2: 29 mEq/L (ref 19–32)
CO2: 27 mEq/L (ref 19–32)
CO2: 29 mEq/L (ref 19–32)
CREATININE: 2.17 mg/dL — AB (ref 0.50–1.10)
CREATININE: 2.42 mg/dL — AB (ref 0.50–1.10)
CREATININE: 2.49 mg/dL — AB (ref 0.50–1.10)
Calcium: 10 mg/dL (ref 8.4–10.5)
Calcium: 9.9 mg/dL (ref 8.4–10.5)
Calcium: 9.4 mg/dL (ref 8.4–10.5)
Chloride: 127 mEq/L — ABNORMAL HIGH (ref 96–112)
Chloride: 121 mEq/L — ABNORMAL HIGH (ref 96–112)
GFR calc Af Amer: 23 mL/min — ABNORMAL LOW (ref >90)
GFR calc non Af Amer: 19 mL/min — ABNORMAL LOW (ref >90)
GFR calc non Af Amer: 17 mL/min — ABNORMAL LOW (ref >90)
GFR calc non Af Amer: 16 mL/min — ABNORMAL LOW (ref >90)
GFR, EST AFRICAN AMERICAN: 20 mL/min — AB (ref >90)
GFR, EST AFRICAN AMERICAN: 19 mL/min — AB (ref >90)
GLUCOSE: 269 mg/dL — AB (ref 70–99)
Glucose, Bld: 102 mg/dL — ABNORMAL HIGH (ref 70–99)
Glucose, Bld: 256 mg/dL — ABNORMAL HIGH (ref 70–99)
POTASSIUM: 3.6 meq/L — AB (ref 3.7–5.3)
Potassium: 4.6 mEq/L (ref 3.7–5.3)
Potassium: 4.3 mEq/L (ref 3.7–5.3)
Sodium: 173 mEq/L (ref 137–147)
Sodium: 171 mEq/L (ref 137–147)
Sodium: 163 mEq/L (ref 137–147)

## 2013-05-15 LAB — GLUCOSE, CAPILLARY
GLUCOSE-CAPILLARY: 499 mg/dL — AB (ref 70–99)
GLUCOSE-CAPILLARY: 226 mg/dL — AB (ref 70–99)
GLUCOSE-CAPILLARY: 267 mg/dL — AB (ref 70–99)
GLUCOSE-CAPILLARY: 109 mg/dL — AB (ref 70–99)
GLUCOSE-CAPILLARY: 104 mg/dL — AB (ref 70–99)
Glucose-Capillary: 356 mg/dL — ABNORMAL HIGH (ref 70–99)
Glucose-Capillary: 189 mg/dL — ABNORMAL HIGH (ref 70–99)
Glucose-Capillary: 51 mg/dL — ABNORMAL LOW (ref 70–99)
Glucose-Capillary: 148 mg/dL — ABNORMAL HIGH (ref 70–99)
Glucose-Capillary: 73 mg/dL (ref 70–99)
Glucose-Capillary: 140 mg/dL — ABNORMAL HIGH (ref 70–99)
Glucose-Capillary: 262 mg/dL — ABNORMAL HIGH (ref 70–99)
Glucose-Capillary: 240 mg/dL — ABNORMAL HIGH (ref 70–99)
Glucose-Capillary: 193 mg/dL — ABNORMAL HIGH (ref 70–99)
Glucose-Capillary: 106 mg/dL — ABNORMAL HIGH (ref 70–99)
Glucose-Capillary: 38 mg/dL — CL (ref 70–99)
Glucose-Capillary: 93 mg/dL (ref 70–99)
Glucose-Capillary: 106 mg/dL — ABNORMAL HIGH (ref 70–99)
Glucose-Capillary: 166 mg/dL — ABNORMAL HIGH (ref 70–99)
Glucose-Capillary: 214 mg/dL — ABNORMAL HIGH (ref 70–99)

## 2013-05-15 LAB — MRSA PCR SCREENING: MRSA by PCR: NEGATIVE

## 2013-05-15 LAB — HEMOGLOBIN A1C
HEMOGLOBIN A1C: 8.9 % — AB (ref <5.7)
Mean Plasma Glucose: 209 mg/dL — ABNORMAL HIGH (ref <117)

## 2013-05-15 MED ORDER — CHLORHEXIDINE GLUCONATE 0.12 % MT SOLN
15.0000 mL | Freq: Two times a day (BID) | OROMUCOSAL | Status: DC
  Administered 2013-05-15 – 2013-05-19 (×9): 15 mL via OROMUCOSAL
  Filled 2013-05-15 (×11): qty 15

## 2013-05-15 MED ORDER — BIOTENE DRY MOUTH MT LIQD
15.0000 mL | Freq: Two times a day (BID) | OROMUCOSAL | Status: DC
  Administered 2013-05-15 – 2013-05-19 (×9): 15 mL via OROMUCOSAL

## 2013-05-15 MED ORDER — FREE WATER
200.0000 mL | Freq: Three times a day (TID) | Status: DC
  Administered 2013-05-15: 200 mL

## 2013-05-15 MED ORDER — SODIUM CHLORIDE 0.9 % IV SOLN
INTRAVENOUS | Status: DC
  Administered 2013-05-15: 4.1 [IU]/h via INTRAVENOUS
  Filled 2013-05-15: qty 1

## 2013-05-15 MED ORDER — DEXTROSE 5 % IV SOLN
INTRAVENOUS | Status: DC
  Administered 2013-05-15 (×2): via INTRAVENOUS
  Administered 2013-05-16: 1000 mL via INTRAVENOUS

## 2013-05-15 MED ORDER — FREE WATER: 300.0000 mL | Status: DC

## 2013-05-15 MED ORDER — ONDANSETRON HCL 4 MG/2ML IJ SOLN
4.0000 mg | Freq: Four times a day (QID) | INTRAMUSCULAR | Status: DC | PRN
  Administered 2013-05-15 – 2013-05-18 (×2): 4 mg via INTRAVENOUS
  Filled 2013-05-15 (×3): qty 2

## 2013-05-15 MED ORDER — FREE WATER
100.0000 mL | Status: DC
  Administered 2013-05-17 – 2013-05-19 (×29): 100 mL

## 2013-05-15 MED ORDER — FREE WATER
100.0000 mL | Status: DC
  Administered 2013-05-15 (×2): 100 mL

## 2013-05-15 NOTE — Progress Notes (Signed)
Pt has been having fluctuations in Blood Sugar levels. Drip has been on and off throughout am shift. Notified Dr.Woods of pt status and Sodium Levels. Dr. Sherral Hammers wrote new orders for patient.

## 2013-05-15 NOTE — Progress Notes (Signed)
[facility] PROGRESS NOTE  Kristi Alexander NUU:[id number] DOB: February 23, 1926 DOA: 05/14/2013 PCP: [name], MD  Assessment/Plan: Principal Problem:   HHNC (hyperglycemic hyperosmolar nonketotic coma): CBGs better. Have restarted the insulin drip while patient is on D5 W. No previous history of diabetes reported. Checking A1c.  Active Problems:   Acute renal failure: Worse today, see changes made below   Hyperglycemia: As mentioned above    Chronic respiratory failure: Continue trach mask and care    Acute hypernatremia: Sodium markedly worse. Appreciate critical care help. On D5W now and have added free water through PEG tube. Follow basic metabolic panel every 4 hours. Concentration likely secondary to hypoglycemia induced diuresis and dehydration plus tube feedings    Dehydration: Secondary to diuresis brought on by hyperglycemia    Sinus tachycardia: Secondary to volume depletion and dehydration  Chronic diastolic heart failure: Monitor BNP given need for aggressive IV hydration Code Status: Full code  Family Communication: Left message with daughter  Disposition Plan: Critically ill, keep in step down unit   Consultants:  Critical care  Procedures:  None  Antibiotics:  None  HPI/Subjective: Patient minimally responsive  Objective: Filed Vitals:   05/15/13 1520  BP: 121/60  Pulse: 114  Temp:   Resp: 18    Intake/Output Summary (Last 24 hours) at 05/15/13 1530 Last data filed at 05/15/13 1500  Gross per 24 hour  Intake 1928.9 ml  Output    244 ml  Net 1684.9 ml   Filed Weights   05/14/13 2250  Weight: 54.2 kg (119 lb 7.8 oz)    Exam:   General:  Minimally responsive, responds to sternal rub  Cardiovascular: Regular rate and rhythm, S1-S2  Respiratory: Clear to auscultation bilaterally on trach mask  Abdomen: Soft, PEG tube nondistended, positive bowel sounds  Musculoskeletal: No clubbing or cyanosis or edema   Data  Reviewed: Basic Metabolic Panel:  Recent Labs Lab 05/14/13 1918 05/15/13 0826  NA 155* 173*  K 4.8 4.6  CL 108 129*  CO2 30 29  GLUCOSE 1457* 269*  BUN 90* 87*  CREATININE 1.91* 2.17*  CALCIUM 10.1 10.0   Liver Function Tests:  Recent Labs Lab 05/14/13 1918  AST 20  ALT 24  ALKPHOS 220*  BILITOT 0.5  PROT 8.0  ALBUMIN 3.3*   No results found for this basename: LIPASE, AMYLASE,  in the last 168 hours No results found for this basename: AMMONIA,  in the last 168 hours CBC:  Recent Labs Lab 05/14/13 1918 05/15/13 0826  WBC 15.1* 20.9*  HGB 11.8* 11.6*  HCT 41.8 38.9  MCV 98.8 91.5  PLT 354 273   Cardiac Enzymes: No results found for this basename: CKTOTAL, CKMB, CKMBINDEX, TROPONINI,  in the last 168 hours BNP (last 3 results)  Recent Labs  04/18/13 0610 04/21/13 0701 04/21/13 1015  PROBNP 3112.0* 8813.0* 10944.0*   CBG:  Recent Labs Lab 05/15/13 0931 05/15/13 1034 05/15/13 1216 05/15/13 1352 05/15/13 1505  GLUCAP 140* 226* 262* 267* 240*    Recent Results (from the past 240 hour(s))  MRSA PCR SCREENING     Status: None   Collection Time    05/14/13 10:49 PM      Result Value Ref Range Status   MRSA by PCR NEGATIVE  NEGATIVE Final   Comment:            The GeneXpert MRSA Assay (FDA     approved for NASAL specimens     only), is one component of  a     comprehensive MRSA colonization     surveillance program. It is not     intended to diagnose MRSA     infection nor to guide or     monitor treatment for     MRSA infections.     Studies: Dg Chest 2 View  05/14/2013   CLINICAL DATA:  Altered mental status. Hypertension. Acute on chronic respiratory failure.  EXAM: CHEST  2 VIEW  COMPARISON:  04/22/2013  FINDINGS: Tracheostomy tube is seen in place. Mild cardiomegaly is stable. Mild diffuse interstitial prominence is stable. Decreased infiltrate or atelectasis seen at the lung bases since prior study. No new or worsening of pulmonary  infiltrate seen. No evidence of pleural effusion. Ectasia of the thoracic aorta is stable.  IMPRESSION: Stable cardiomegaly and diffuse interstitial prominence. Resolving bibasilar atelectasis or infiltrates.   Electronically Signed   By: Earle Gell M.D.   On: 05/14/2013 21:14    Scheduled Meds: . amLODipine  5 mg Per Tube Daily  . antiseptic oral rinse  15 mL Mouth Rinse q12n4p  . aspirin  81 mg Oral Daily  . atorvastatin  20 mg Oral q1800  . budesonide  0.25 mg Nebulization Q6H  . chlorhexidine  15 mL Mouth Rinse BID  . famotidine  20 mg Per Tube QHS  . ferrous sulfate  325 mg Oral Q breakfast  . free water  100 mL Per Tube Q4H   Continuous Infusions: . dextrose 125 mL/hr at 05/15/13 1413  . insulin (NOVOLIN-R) infusion 19.1 Units/hr (05/15/13 1507)    Principal Problem:   [place] (hyperglycemic hyperosmolar nonketotic coma) Active Problems:   Acute renal failure   Hyperglycemia   Chronic respiratory failure   Acute hypernatremia   Dehydration   Sinus tachycardia    Time spent: 40 minutes    [facility] Pager [phone]. If 7PM-7AM, please contact night-coverage at [email or web address], password [facility] 05/15/2013, 3:30 PM  LOS: 1 day

## 2013-05-15 NOTE — Progress Notes (Signed)
CBG 51. Stabilizer stopped. D50 39ml given IV. CBG checked 15 minutes later and is now 148. Per stabilizer guidelines, orders to resume insulin gtt at 0.9cc/hr. NP notified and orders to continue the stabilizer. Will monitor.  M.Forest Gleason, RN

## 2013-05-15 NOTE — Progress Notes (Signed)
Paged by Thana Ates [phone] patient currently on glucose stabilizer, however has been below the CBG minimum 4 periods. However patient on D5 W. at 157ml/hr secondary to hypernatremia    A/P   HHNC -Will continue patient on glucose stabilizer since patient will require D5W at 15ml/hr for her hypernatremia -Current AG=13   Hypernatremia -See HHNC -Increase free water per PEG tube to 122ml q 2hr -BMP now then every 4 hour

## 2013-05-15 NOTE — Consult Note (Signed)
Name: Kristi Alexander MRN: [id number] DOB: 24-Feb-1926    ADMISSION DATE:  05/14/2013 CONSULTATION DATE:  3/22  REFERRING MD :  [place] Pink  PRIMARY SERVICE:  [facility]   CHIEF COMPLAINT:  Hypernatremia   BRIEF PATIENT DESCRIPTION:  Trach/peg dependant 80 yo who resides at SNF. Admitted on 3/21 w/ HHNC and severe hypernatremia. She was placed on Insulin,  Volume resuscitated, placed on MIVF of D5 1/2NS. PCCM asked to see 3/22 w/ concern that Na had risen from 155-->173 in spite of interventions.   SIGNIFICANT EVENTS / STUDIES:  none  LINES / TUBES: Chronic trach   CULTURES: none  ANTIBIOTICS: none  HISTORY OF PRESENT ILLNESS:   80 y.o. female residing in the [facility] for Rehab therapy after she was hospitalized from 03/22/2013 until 04/19/2013 for a High Grade SBO of which she underwent an Exploratory Laparotomy with Small Bowel Resection on 03/23/2013 and developed Respiratory Failure requiring Tracheostomy placement and PEG tube placement who was sent from the SNF on 3/21 due to worsening confusion and progressive lethargy over the last 3 days. She was evaluated in the ED and was found to have a glucose level of 1457, and a sodium level of 155. And a BUN/Cr of 90/1.91. She was admininistered IVFs and started on the [facility] and referred for medical admission. Treatment to date has included: insulin gtt, IV hydration (initially D51/2NS). Her glucose is better controlled, however since admission her Na has risen from 155 to 173 which was the reason for Critical care consultation.     PAST MEDICAL HISTORY :  Past Medical History  Diagnosis Date  . Restless leg syndrome   . Hypertension   . Dyslipidemia   . History of appendectomy   . H/O: hysterectomy   . Cyst of breast, right, benign solitary   . Inguinal hernia   . History of lumbosacral spine surgery   . Arthritis   . HHNC (hyperglycemic hyperosmolar nonketotic coma) 05/14/2013   Past Surgical History    Procedure Laterality Date  . Appendectomy    . Abdominal hysterectomy    . Laparotomy N/A 03/23/2013    Procedure: EXPLORATORY LAPAROTOMY;  Surgeon: Gwenyth Ober, MD;  Location: [place];  Service: General;  Laterality: N/A;  . Bowel resection N/A 03/23/2013    Procedure: SMALL BOWEL RESECTION;  Surgeon: Gwenyth Ober, MD;  Location: [place];  Service: General;  Laterality: N/A;  . Tracheostomy      feinstein  . Esophagogastroduodenoscopy N/A 04/01/2013    Procedure: ESOPHAGOGASTRODUODENOSCOPY (EGD);  Surgeon: Gwenyth Ober, MD;  Location: [place];  Service: General;  Laterality: N/A;  . Peg placement N/A 04/01/2013    Procedure: PERCUTANEOUS ENDOSCOPIC GASTROSTOMY (PEG) PLACEMENT;  Surgeon: Gwenyth Ober, MD;  Location: [place];  Service: General;  Laterality: N/A;   Prior to Admission medications   Medication Sig Start Date End Date Taking? Authorizing Provider  amLODipine (NORVASC) 5 MG tablet 5 mg by Gastric Tube route daily.   Yes Historical Provider, MD  aspirin 81 MG tablet 81 mg by Gastric Tube route daily.    Yes Historical Provider, MD  atorvastatin (LIPITOR) 20 MG tablet 20 mg by Gastric Tube route every evening. 5pm 01/04/13  Yes Historical Provider, MD  budesonide (PULMICORT) 0.25 MG/2ML nebulizer solution 0.25 mg by Tracheal Tube route every 6 (six) hours.   Yes Historical Provider, MD  famotidine (PEPCID) 40 MG/5ML suspension 20 mg by Gastric Tube route at bedtime.   Yes Historical  Provider, MD  ferrous sulfate 325 (65 FE) MG tablet 325 mg by Gastric Tube route daily.    Yes Historical Provider, MD  furosemide (LASIX) 10 MG/ML solution 20 mg by Gastric Tube route 2 (two) times daily.   Yes Historical Provider, MD  Nutritional Supplements (FEEDING SUPPLEMENT, OSMOLITE 1.2 CAL,) LIQD Place 1,000 mLs into feeding tube continuous. 85 ml/hr 12 noon to 8am (allow 4 hours downtime for therapy)   Yes Historical Provider, MD  potassium chloride 20 MEQ/15ML (10%) solution 20 mEq by  Gastric Tube route 3 (three) times daily. 9am, 2pm, 9pm   Yes Historical Provider, MD  levofloxacin (LEVAQUIN) 750 MG tablet 750 mg by Gastric Tube route daily. 7 day course completed 05/04/13    Historical Provider, MD   Allergies  Allergen Reactions  . Penicillins Swelling    FAMILY HISTORY:  Family History  Problem Relation Age of Onset  . Heart attack Father   . Lung cancer Brother   . Healthy Sister    SOCIAL HISTORY:  reports that she quit smoking about 10 years ago. Her smoking use included Cigarettes. She smoked 0.00 packs per day. She has never used smokeless tobacco. She reports that she drinks alcohol. She reports that she does not use illicit drugs.  REVIEW OF SYSTEMS:   Unable   SUBJECTIVE:  No acute distress  VITAL SIGNS: Temp:  [97.5 F (36.4 C)-98.2 F (36.8 C)] 98.2 F (36.8 C) (03/22 1226) Pulse Rate:  [109-127] 122 (03/22 1400) Resp:  [14-39] 22 (03/22 1400) BP: (109-179)/(60-113) 121/60 mmHg (03/22 1400) SpO2:  [94 %-100 %] 97 % (03/22 1400) FiO2 (%):  [28 %] 28 % (03/22 1400) Weight:  [54.2 kg (119 lb 7.8 oz)] 54.2 kg (119 lb 7.8 oz) (03/21 2250)  PHYSICAL EXAMINATION: General:  Frail elderly female, awake, not in distress.  Neuro:  Awake, no focal def  HEENT:  Trach uncuffed. Site clean  Cardiovascular:  rrr Lungs:  Scattered rhonchi  Abdomen:  PEG + bowel sounds  Musculoskeletal:  Intact  Skin:  Dry no edema    Recent Labs Lab 05/14/13 1918 05/15/13 0826  NA 155* 173*  K 4.8 4.6  CL 108 129*  CO2 30 29  BUN 90* 87*  CREATININE 1.91* 2.17*  GLUCOSE 1457* 269*    Recent Labs Lab 05/14/13 1918 05/15/13 0826  HGB 11.8* 11.6*  HCT 41.8 38.9  WBC 15.1* 20.9*  PLT 354 273   Dg Chest 2 View  05/14/2013   CLINICAL DATA:  Altered mental status. Hypertension. Acute on chronic respiratory failure.  EXAM: CHEST  2 VIEW  COMPARISON:  04/22/2013  FINDINGS: Tracheostomy tube is seen in place. Mild cardiomegaly is stable. Mild diffuse  interstitial prominence is stable. Decreased infiltrate or atelectasis seen at the lung bases since prior study. No new or worsening of pulmonary infiltrate seen. No evidence of pleural effusion. Ectasia of the thoracic aorta is stable.  IMPRESSION: Stable cardiomegaly and diffuse interstitial prominence. Resolving bibasilar atelectasis or infiltrates.   Electronically Signed   By: Earle Gell M.D.   On: 05/14/2013 21:14    ASSESSMENT / PLAN:  Severe hypernatremia in setting of dehydration following admission for hyperosmolar nonketotic hyperglycemic state. Suspect she had significant glucose osmotic diuresis and resultant free water loss.  Rec: -Agree w/ free water replacement. Current deficit calculated at 4.6 liters. Would aim to replace ~ 1/2 of this volume over the next 24 hrs. Need to be careful not to replace to rapidly. Agree w/  125 ml/hr -Would trend chemistry every 8 hrs -will need to adjust insulin to account for additional dextrose w/ free water administration  -does not need ICU level care.   Mild AKI Suspect this should improve w/ hydration  Plan Cont current rx Renal dose meds F/u chemistry   Tracheostomy dependence  Plan Cont routine trach care Will need to assure she has trach clinic f/u at d/c  Dysphagia Plan Cont Tube feeds.    Mariel Sleet Beeper  [phone]  Cell  [phone]  If no response or cell goes to voicemail, call beeper [phone]  05/15/2013, 2:13 PM

## 2013-05-15 NOTE — Progress Notes (Signed)
Cbg 38.Insulin drip turned off per glucostabilizer. D50 39ml IV given. CBg 31min later 109..Insulin gtt restarted at 2 per glucostabilizer. Will cont to monitor. Susie Cassette RN

## 2013-05-16 DIAGNOSIS — J9620 Acute and chronic respiratory failure, unspecified whether with hypoxia or hypercapnia: Secondary | ICD-10-CM

## 2013-05-16 LAB — GLUCOSE, CAPILLARY
GLUCOSE-CAPILLARY: 150 mg/dL — AB (ref 70–99)
GLUCOSE-CAPILLARY: 143 mg/dL — AB (ref 70–99)
GLUCOSE-CAPILLARY: 130 mg/dL — AB (ref 70–99)
GLUCOSE-CAPILLARY: 116 mg/dL — AB (ref 70–99)
Glucose-Capillary: 164 mg/dL — ABNORMAL HIGH (ref 70–99)
Glucose-Capillary: 187 mg/dL — ABNORMAL HIGH (ref 70–99)
Glucose-Capillary: 106 mg/dL — ABNORMAL HIGH (ref 70–99)
Glucose-Capillary: 128 mg/dL — ABNORMAL HIGH (ref 70–99)
Glucose-Capillary: 66 mg/dL — ABNORMAL LOW (ref 70–99)
Glucose-Capillary: 139 mg/dL — ABNORMAL HIGH (ref 70–99)
Glucose-Capillary: 214 mg/dL — ABNORMAL HIGH (ref 70–99)
Glucose-Capillary: 200 mg/dL — ABNORMAL HIGH (ref 70–99)
Glucose-Capillary: >600 mg/dL (ref 70–99)
Glucose-Capillary: 124 mg/dL — ABNORMAL HIGH (ref 70–99)

## 2013-05-16 LAB — CBC
HCT: 28.8 % — ABNORMAL LOW (ref 36.0–46.0)
Hemoglobin: 8.7 g/dL — ABNORMAL LOW (ref 12.0–15.0)
MCH: 27 pg (ref 26.0–34.0)
MCHC: 30.2 g/dL (ref 30.0–36.0)
MCV: 89.4 fL (ref 78.0–100.0)
PLATELETS: 198 10*3/uL (ref 150–400)
RBC: 3.22 MIL/uL — AB (ref 3.87–5.11)
RDW: 20.5 % — AB (ref 11.5–15.5)
WBC: 14.1 10*3/uL — ABNORMAL HIGH (ref 4.0–10.5)

## 2013-05-16 LAB — BASIC METABOLIC PANEL
BUN: 82 mg/dL — ABNORMAL HIGH (ref 6–23)
BUN: 77 mg/dL — ABNORMAL HIGH (ref 6–23)
BUN: 70 mg/dL — ABNORMAL HIGH (ref 6–23)
CALCIUM: 8.5 mg/dL (ref 8.4–10.5)
CALCIUM: 8.5 mg/dL (ref 8.4–10.5)
CO2: 29 mEq/L (ref 19–32)
CO2: 25 mEq/L (ref 19–32)
CO2: 28 mEq/L (ref 19–32)
CREATININE: 2.51 mg/dL — AB (ref 0.50–1.10)
CREATININE: 1.89 mg/dL — AB (ref 0.50–1.10)
Calcium: 9 mg/dL (ref 8.4–10.5)
Chloride: 121 mEq/L — ABNORMAL HIGH (ref 96–112)
Chloride: 116 mEq/L — ABNORMAL HIGH (ref 96–112)
Chloride: 114 mEq/L — ABNORMAL HIGH (ref 96–112)
Creatinine, Ser: 2.31 mg/dL — ABNORMAL HIGH (ref 0.50–1.10)
GFR calc Af Amer: 27 mL/min — ABNORMAL LOW (ref >90)
GFR calc non Af Amer: 23 mL/min — ABNORMAL LOW (ref >90)
GFR, EST AFRICAN AMERICAN: 19 mL/min — AB (ref >90)
GFR, EST AFRICAN AMERICAN: 21 mL/min — AB (ref >90)
GFR, EST NON AFRICAN AMERICAN: 16 mL/min — AB (ref >90)
GFR, EST NON AFRICAN AMERICAN: 18 mL/min — AB (ref >90)
GLUCOSE: 148 mg/dL — AB (ref 70–99)
Glucose, Bld: 89 mg/dL (ref 70–99)
Glucose, Bld: 215 mg/dL — ABNORMAL HIGH (ref 70–99)
Potassium: 3.7 mEq/L (ref 3.7–5.3)
Potassium: 4.1 mEq/L (ref 3.7–5.3)
Potassium: 3.8 mEq/L (ref 3.7–5.3)
Sodium: 163 mEq/L (ref 137–147)
Sodium: 155 mEq/L — ABNORMAL HIGH (ref 137–147)
Sodium: 154 mEq/L — ABNORMAL HIGH (ref 137–147)

## 2013-05-16 MED ORDER — INSULIN GLARGINE 100 UNIT/ML ~~LOC~~ SOLN
5.0000 [IU] | SUBCUTANEOUS | Status: AC
  Administered 2013-05-16: 5 [IU] via SUBCUTANEOUS
  Filled 2013-05-16 (×2): qty 0.05

## 2013-05-16 MED ORDER — SODIUM CHLORIDE 0.45 % IV SOLN
INTRAVENOUS | Status: DC
  Administered 2013-05-16 (×2): 100 mL/h via INTRAVENOUS
  Administered 2013-05-17: 1000 mL via INTRAVENOUS
  Administered 2013-05-18: 02:00:00 via INTRAVENOUS

## 2013-05-16 MED ORDER — GLUCERNA 1.2 CAL PO LIQD
1000.0000 mL | ORAL | Status: DC
  Administered 2013-05-16: 1000 mL
  Filled 2013-05-16 (×6): qty 1000

## 2013-05-16 MED ORDER — OSMOLITE 1.2 CAL PO LIQD
1000.0000 mL | ORAL | Status: DC
  Filled 2013-05-16 (×2): qty 1000

## 2013-05-16 MED ORDER — INSULIN ASPART 100 UNIT/ML ~~LOC~~ SOLN
0.0000 [IU] | SUBCUTANEOUS | Status: DC
  Administered 2013-05-16: 2 [IU] via SUBCUTANEOUS
  Administered 2013-05-16 – 2013-05-17 (×6): 1 [IU] via SUBCUTANEOUS
  Administered 2013-05-17 (×2): 2 [IU] via SUBCUTANEOUS
  Administered 2013-05-17: 3 [IU] via SUBCUTANEOUS
  Administered 2013-05-18: 1 [IU] via SUBCUTANEOUS
  Administered 2013-05-18 (×2): 2 [IU] via SUBCUTANEOUS
  Administered 2013-05-18 – 2013-05-19 (×4): 1 [IU] via SUBCUTANEOUS
  Administered 2013-05-19: 2 [IU] via SUBCUTANEOUS
  Administered 2013-05-19: 1 [IU] via SUBCUTANEOUS

## 2013-05-16 MED ORDER — INSULIN GLARGINE 100 UNIT/ML ~~LOC~~ SOLN
5.0000 [IU] | Freq: Every day | SUBCUTANEOUS | Status: DC
  Administered 2013-05-17 – 2013-05-19 (×3): 5 [IU] via SUBCUTANEOUS
  Filled 2013-05-16 (×3): qty 0.05

## 2013-05-16 NOTE — Progress Notes (Signed)
I received a call from [facility] rehab asking if pt still needs a bed at vent SNF. I made the referral back in February--I explained pt is now on trach and not utilizing a vent, and asked that facility look into a referral I made for another female pt currently in the hospital who is utilizing a nocturnal vent. I will complete assessment for this patient after speaking with pt/family. Pt came to hospital from [facility] and respiratory is switching trach to a 4 shiley cuffless.   Ky Barban, MSW, [facility] Clinical Social Worker [phone]

## 2013-05-16 NOTE — Progress Notes (Signed)
I agree with the Student-Dietitian note and made appropriate revisions.  Katie Bartosz Luginbill, RD, LDN Pager #: [phone] After-Hours Pager #: [phone]  

## 2013-05-16 NOTE — Progress Notes (Signed)
[facility] PROGRESS NOTE  BREALYNN CONTINO ION:[id number] DOB: [birth year] DOA: 05/14/2013 PCP: [name], MD  Interim summary:  80 year old African American female past medical history of mild dementia, CVA status post PEG tube and chronic trach came from skilled nursing facility on 3/21 with a decreased level of responsiveness and found to have acute renal failure, blood sugar of 1400 (no previous history of diabetes reported) and also sodium level of 155 (when corrected for severe hyperglycemia, actual sodium level was 176!).  Patient started on IV fluids-free water and insulin and by 3/23 sodium down to 155, creatinine slowly improving and CBGs normalizing. Patient herself is more awake and interactive.  Assessment/Plan: Principal Problem:   HHNC (hyperglycemic hyperosmolar nonketotic coma): CBGs better. Patient weaned off of insulin drip. Have given dose of Lantus and asked diabetes coordinator for assistance. Patient on chronic tube feeds. Appreciate nutrition help to change tube feedings so that less episodes of hyperglycemia.  Active Problems:   Acute renal failure:  Patient's baseline creatinine is normal, as evidenced by a creatinine of 0.71 on 2/27. Continue hydration. This is all prerenal secondary to dehydration brought on by diuresis    dysphagia: Tube feedings restarted. Appreciate nutrition help and tube feeding type changed to have less hyperglycemia    Chronic respiratory failure: Continue trach mask and care.  Seen by pulmonary and tracheostomy exchanged     Acute hypernatremia: sodium finally starting to improve. Continue free water. D5W discontinued and now on half normal saline     Dehydration: Secondary to diuresis brought on by hyperglycemia, improved     Sinus tachycardia: Secondary to volume depletion and dehydration, improved   Chronic diastolic heart failure: Monitor BNP given need for aggressive IV hydration.  Recheck BNP once renal function  normalized  Code Status: Full code  Family Communication: Left message with daughter  Disposition Plan:  await afternoon labs, and if continued improvement, transfer to floor    Consultants:  Critical care  Procedures:  None  Antibiotics:  None  HPI/Subjective:  patient more awake and interactive. No acute distress   Objective: Filed Vitals:   05/16/13 1136  BP:   Pulse: 103  Temp:   Resp: 22    Intake/Output Summary (Last 24 hours) at 05/16/13 1525 Last data filed at 05/16/13 1300  Gross per 24 hour  Intake   2800 ml  Output      2 ml  Net   2798 ml   Filed Weights   05/14/13 2250  Weight: 54.2 kg (119 lb 7.8 oz)    Exam:   General:   alert and oriented x2?, No acute distress   Cardiovascular: Regular rate and rhythm, S1-S2  Respiratory: Clear to auscultation bilaterally on trach mask  Abdomen: Soft, PEG tube nondistended, positive bowel sounds  Musculoskeletal: No clubbing or cyanosis or edema   Data Reviewed: Basic Metabolic Panel:  Recent Labs Lab 05/15/13 0826 05/15/13 1714 05/15/13 2240 05/16/13 0238 05/16/13 0649  NA 173* 171* 163* 163* 155*  K 4.6 3.6* 4.3 3.7 4.1  CL 129* 127* 121* 121* 116*  CO2 29 27 29 29 25   GLUCOSE 269* 102* 256* 89 215*  BUN 87* 88* 86* 82* 77*  CREATININE 2.17* 2.42* 2.49* 2.51* 2.31*  CALCIUM 10.0 9.9 9.4 9.0 8.5   Liver Function Tests:  Recent Labs Lab 05/14/13 1918  AST 20  ALT 24  ALKPHOS 220*  BILITOT 0.5  PROT 8.0  ALBUMIN 3.3*   No results found for this  basename: LIPASE, AMYLASE,  in the last 168 hours No results found for this basename: AMMONIA,  in the last 168 hours CBC:  Recent Labs Lab 05/14/13 1918 05/15/13 0826  WBC 15.1* 20.9*  HGB 11.8* 11.6*  HCT 41.8 38.9  MCV 98.8 91.5  PLT 354 273   Cardiac Enzymes: No results found for this basename: CKTOTAL, CKMB, CKMBINDEX, TROPONINI,  in the last 168 hours BNP (last 3 results)  Recent Labs  04/18/13 0610 04/21/13 0701  04/21/13 1015  PROBNP 3112.0* 8813.0* 10944.0*   CBG:  Recent Labs Lab 05/16/13 0517 05/16/13 0631 05/16/13 0747 05/16/13 0915 05/16/13 1247  GLUCAP 139* 214* 200* 150* 143*    Recent Results (from the past 240 hour(s))  MRSA PCR SCREENING     Status: None   Collection Time    05/14/13 10:49 PM      Result Value Ref Range Status   MRSA by PCR NEGATIVE  NEGATIVE Final   Comment:            The GeneXpert MRSA Assay (FDA     approved for NASAL specimens     only), is one component of a     comprehensive MRSA colonization     surveillance program. It is not     intended to diagnose MRSA     infection nor to guide or     monitor treatment for     MRSA infections.     Studies: Dg Chest 2 View  05/14/2013   CLINICAL DATA:  Altered mental status. Hypertension. Acute on chronic respiratory failure.  EXAM: CHEST  2 VIEW  COMPARISON:  04/22/2013  FINDINGS: Tracheostomy tube is seen in place. Mild cardiomegaly is stable. Mild diffuse interstitial prominence is stable. Decreased infiltrate or atelectasis seen at the lung bases since prior study. No new or worsening of pulmonary infiltrate seen. No evidence of pleural effusion. Ectasia of the thoracic aorta is stable.  IMPRESSION: Stable cardiomegaly and diffuse interstitial prominence. Resolving bibasilar atelectasis or infiltrates.   Electronically Signed   By: Earle Gell M.D.   On: 05/14/2013 21:14    Scheduled Meds: . amLODipine  5 mg Per Tube Daily  . antiseptic oral rinse  15 mL Mouth Rinse q12n4p  . aspirin  81 mg Oral Daily  . atorvastatin  20 mg Oral q1800  . budesonide  0.25 mg Nebulization Q6H  . chlorhexidine  15 mL Mouth Rinse BID  . famotidine  20 mg Per Tube QHS  . ferrous sulfate  325 mg Oral Q breakfast  . free water  100 mL Per Tube Q2H  . insulin aspart  0-9 Units Subcutaneous 6 times per day   Continuous Infusions: . sodium chloride 100 mL/hr at 05/16/13 1330  . feeding supplement (GLUCERNA 1.2 CAL)       Principal Problem:   [place] (hyperglycemic hyperosmolar nonketotic coma) Active Problems:   Acute renal failure   Hyperglycemia   Chronic respiratory failure   Acute hypernatremia   Dehydration   Sinus tachycardia    Time spent:  25  minutes    [facility] Pager [phone]. If 7PM-7AM, please contact night-coverage at [email or web address], password [facility] 05/16/2013, 3:25 PM  LOS: 2 days

## 2013-05-16 NOTE — Progress Notes (Signed)
INITIAL NUTRITION ASSESSMENT  DOCUMENTATION CODES Per approved criteria  -Not Applicable   INTERVENTION: Initiate Glucerna 1.2 formula via PEG tube at 20 ml/hr and increase by 10 ml every 4 hours to goal rate of 60 ml/hr to provide 1728 kcals, 86 gm protein, 1159 ml of free water RD to follow for nutrition care plan  NUTRITION DIAGNOSIS: Inadequate oral intake related to inability to eat as evidenced by TF dependency   Goal: Pt to meet >/= 90% of their estimated nutrition needs   Monitor:  TF regimen & tolerance, respiratory status, weight, labs, I/O's  Reason for Assessment: Malnutrition Screening Tool Report  80 y.o. female  Admitting Dx: Kristi Alexander (hyperglycemic hyperosmolar nonketotic coma)  ASSESSMENT: 80 y.o. female residing in the [facility] for Rehab therapy after she was hospitalized from 03/22/2013 until 04/19/2013 for a High Grade SBO of which she underwent an Exploratory Laparotomy with Small Bowel Resection on 03/23/2013 and developed Respiratory Failure requiring Tracheostomy placement and PEG tube placement who was sent from the SNF due to worsening confusion and progressive lethargy over the last 3 days. She was evaluated in the ED and was found to have a glucose level of 1457, and a sodium level of 155. And a BUN/Cr of 90/1.91. She was admininistered IVFs and started on the [facility] and referred for medical admission.   Patient known to this RD during previous hospitalization; pt currently on trach collar; pt's TF regimen via PEG at [facility] -- Osmolite 1.2 formula at 85 ml/hr from 12 PM--8 AM (allowing 4 hours for therapies) -- provides 2040 total kcals, 94 gm protein, 1394 ml of free water; pt's weight has been stable since February 2015.  RD spoke with Dr. Maryland Pink regarding changing TF formula to Glucerna 1.2 to promote better glycemic control.  Orders received with telephone readback.  Height: Ht Readings from Last 1  Encounters:  05/14/13 5\' 2"  (1.575 m)    Weight: Wt Readings from Last 1 Encounters:  05/14/13 119 lb 7.8 oz (54.2 kg)    Ideal Body Weight: 110 lb  % Ideal Body Weight: 108%  Wt Readings from Last 10 Encounters:  05/14/13 119 lb 7.8 oz (54.2 kg)  04/22/13 134 lb 4.2 oz (60.9 kg)  04/19/13 157 lb 13.6 oz (71.6 kg)  04/19/13 157 lb 13.6 oz (71.6 kg)  04/19/13 157 lb 13.6 oz (71.6 kg)  01/19/13 144 lb (65.318 kg)  07/21/12 145 lb (65.772 kg)  06/17/11 143 lb (64.864 kg)  05/16/13    RD re-weighed pt on bed scale at 137 lbs  Usual Body Weight: 134 lb -- February 2014  % Usual Body Weight: 102%  BMI:  Body mass index is 21.85 kg/(m^2).  Estimated Nutritional Needs: Kcal: 1600-1800 Protein: 80-90 gm Fluid: 1.6-1.8 L  Skin: Intact  Diet Order: NPO  EDUCATION NEEDS: -No education needs identified at this time   Intake/Output Summary (Last 24 hours) at 05/16/13 1211 Last data filed at 05/16/13 0757  Gross per 24 hour  Intake   2800 ml  Output      0 ml  Net   2800 ml   Labs:   Recent Labs Lab 05/15/13 2240 05/16/13 0238 05/16/13 0649  NA 163* 163* 155*  K 4.3 3.7 4.1  CL 121* 121* 116*  CO2 29 29 25   BUN 86* 82* 77*  CREATININE 2.49* 2.51* 2.31*  CALCIUM 9.4 9.0 8.5  GLUCOSE 256* 89 215*    CBG (last 3)  Recent Labs  05/16/13 0631 05/16/13 0747 05/16/13 0915  GLUCAP 214* 200* 150*    Scheduled Meds: . amLODipine  5 mg Per Tube Daily  . antiseptic oral rinse  15 mL Mouth Rinse q12n4p  . aspirin  81 mg Oral Daily  . atorvastatin  20 mg Oral q1800  . budesonide  0.25 mg Nebulization Q6H  . chlorhexidine  15 mL Mouth Rinse BID  . famotidine  20 mg Per Tube QHS  . ferrous sulfate  325 mg Oral Q breakfast  . free water  100 mL Per Tube Q2H  . insulin aspart  0-9 Units Subcutaneous 6 times per day  . insulin glargine  5 Units Subcutaneous NOW    Continuous Infusions: . sodium chloride 100 mL/hr (05/16/13 0921)  . feeding supplement  (OSMOLITE 1.2 CAL)      Past Medical History  Diagnosis Date  . Restless leg syndrome   . Hypertension   . Dyslipidemia   . History of appendectomy   . H/O: hysterectomy   . Cyst of breast, right, benign solitary   . Inguinal hernia   . History of lumbosacral spine surgery   . Arthritis   . HHNC (hyperglycemic hyperosmolar nonketotic coma) 05/14/2013    Past Surgical History  Procedure Laterality Date  . Appendectomy    . Abdominal hysterectomy    . Laparotomy N/A 03/23/2013    Procedure: EXPLORATORY LAPAROTOMY;  Surgeon: Gwenyth Ober, MD;  Location: [place];  Service: General;  Laterality: N/A;  . Bowel resection N/A 03/23/2013    Procedure: SMALL BOWEL RESECTION;  Surgeon: Gwenyth Ober, MD;  Location: [place];  Service: General;  Laterality: N/A;  . Tracheostomy      feinstein  . Esophagogastroduodenoscopy N/A 04/01/2013    Procedure: ESOPHAGOGASTRODUODENOSCOPY (EGD);  Surgeon: Gwenyth Ober, MD;  Location: [facility] ENDOSCOPY;  Service: General;  Laterality: N/A;  . Peg placement N/A 04/01/2013    Procedure: PERCUTANEOUS ENDOSCOPIC GASTROSTOMY (PEG) PLACEMENT;  Surgeon: Gwenyth Ober, MD;  Location: [place];  Service: General;  Laterality: N/A;    Arthur Holms, RD, LDN Pager #: [phone] After-Hours Pager #: [phone]

## 2013-05-16 NOTE — Consult Note (Signed)
Name: Kristi Alexander MRN: [id number] DOB: 25-Oct-1925    ADMISSION DATE:  05/14/2013 CONSULTATION DATE:  3/22  REFERRING MD :  Maryland Pink  PRIMARY SERVICE:  [facility]   CHIEF COMPLAINT:  Hypernatremia   BRIEF PATIENT DESCRIPTION:  Trach/peg dependant 80 yo who resides at SNF. Admitted on 3/21 w/ HHNC and severe hypernatremia. She was placed on Insulin,  Volume resuscitated, placed on MIVF of D5 1/2NS. PCCM asked to see 3/22 w/ concern that Na had risen from 155-->173 in spite of interventions.   SIGNIFICANT EVENTS / STUDIES:  none  LINES / TUBES: Chronic trach   CULTURES: none  ANTIBIOTICS: none  SUBJECTIVE:  No events overnight.  VITAL SIGNS: Temp:  [98.2 F (36.8 C)-98.8 F (37.1 C)] 98.4 F (36.9 C) (03/23 1126) Pulse Rate:  [97-124] 103 (03/23 1136) Resp:  [18-32] 22 (03/23 1136) BP: (93-147)/(52-82) 127/57 mmHg (03/23 1126) SpO2:  [93 %-100 %] 100 % (03/23 1136) FiO2 (%):  [28 %-40 %] 35 % (03/23 1136)  PHYSICAL EXAMINATION: General:  Frail elderly female, awake, not in distress.  Neuro:  Awake, no focal def  HEENT:  Trach uncuffed. Site clean  Cardiovascular:  rrr Lungs:  Scattered rhonchi  Abdomen:  PEG + bowel sounds  Musculoskeletal:  Intact  Skin:  Dry no edema   Recent Labs Lab 05/15/13 2240 05/16/13 0238 05/16/13 0649  NA 163* 163* 155*  K 4.3 3.7 4.1  CL 121* 121* 116*  CO2 29 29 25   BUN 86* 82* 77*  CREATININE 2.49* 2.51* 2.31*  GLUCOSE 256* 89 215*   Recent Labs Lab 05/14/13 1918 05/15/13 0826  HGB 11.8* 11.6*  HCT 41.8 38.9  WBC 15.1* 20.9*  PLT 354 273   Dg Chest 2 View  05/14/2013   CLINICAL DATA:  Altered mental status. Hypertension. Acute on chronic respiratory failure.  EXAM: CHEST  2 VIEW  COMPARISON:  04/22/2013  FINDINGS: Tracheostomy tube is seen in place. Mild cardiomegaly is stable. Mild diffuse interstitial prominence is stable. Decreased infiltrate or atelectasis seen at the lung bases since prior study. No new or  worsening of pulmonary infiltrate seen. No evidence of pleural effusion. Ectasia of the thoracic aorta is stable.  IMPRESSION: Stable cardiomegaly and diffuse interstitial prominence. Resolving bibasilar atelectasis or infiltrates.   Electronically Signed   By: Earle Gell M.D.   On: 05/14/2013 21:14    ASSESSMENT / PLAN:  Severe hypernatremia in setting of dehydration following admission for hyperosmolar nonketotic hyperglycemic state. Suspect she had significant glucose osmotic diuresis and resultant free water loss.  Rec: - Continue free water replacement. Current deficit originally calculated at 4.6 liters. Would aim to replace ~ 1/2 of this volume over 24 hrs (done). Need to be careful not to replace to rapidly. Agree w/ 125 ml/hr. - Would trend chemistry every 8 hrs and replace electrolytes as indicated. - Will need to adjust insulin to account for additional dextrose w/ free water administration  - No ICU transfer, improving.  Mild AKI Suspect this should improve w/ hydration  Plan - Cont current rx. - Renal dose meds. - F/u chemistry.  Tracheostomy dependence  Plan - Cont routine trach care - Will change trach today and can come follow up with trach clinic in 3 months from (june at one point), please make appointment upon discharge.  Dysphagia Plan - Cont Tube feeds.   No further interventions beyond delineated above.  PCCM will sign off, please call back if needed.  Rush Farmer, M.D.  [place]. Pager: [phone]. After hours pager: [phone].

## 2013-05-16 NOTE — Procedures (Signed)
Tracheostomy Change Note  Patient Details:   Name: Kristi Alexander DOB: 05-17-1925 MRN: [id number]    Airway Documentation:    Lurline Idol changed per protocol. 2 RTs at bedside. Positive ETCO2 color change. BBS rhonchi. #4.0 shiley uncuffed replaced.  Evaluation  O2 sats: stable throughout and currently acceptable Complications: No apparent complications Patient did tolerate procedure well. Bilateral Breath Sounds: Rhonchi    Lissa Merlin 05/16/2013, 2:47 PM

## 2013-05-16 NOTE — Progress Notes (Addendum)
Inpatient Diabetes Program Recommendations  AACE/ADA: New Consensus Statement on Inpatient Glycemic Control (2013)  Target Ranges:  Prepandial:   less than 140 mg/dL      Peak postprandial:   less than 180 mg/dL (1-2 hours)      Critically ill patients:  140 - 180 mg/dL  Results for Kristi Alexander, Kristi Alexander (MRN [id number]) as of 05/16/2013 09:26  Ref. Range 05/16/2013 01:55 05/16/2013 03:44 05/16/2013 04:04 05/16/2013 05:17 05/16/2013 06:31 05/16/2013 07:47  Glucose-Capillary Latest Range: 70-99 mg/dL 106 (H) 66 (L) 128 (H) 139 (H) 214 (H) 200 (H)   Agree with current transition orders Lantus 5 units and sensitive scale q 4.  Will follow. Consult received: will monitor for recommendations with the addition of tube feeds.  Thank you  Raoul Pitch BSN, RN,CDE Inpatient Diabetes Coordinator [phone] (team pager)

## 2013-05-17 ENCOUNTER — Inpatient Hospital Stay (HOSPITAL_COMMUNITY): Admission: RE | Admit: 2013-05-17 | Payer: Medicare Other | Source: Ambulatory Visit

## 2013-05-17 DIAGNOSIS — Z930 Tracheostomy status: Secondary | ICD-10-CM

## 2013-05-17 LAB — CBC
HCT: 28.6 % — ABNORMAL LOW (ref 36.0–46.0)
Hemoglobin: 8.9 g/dL — ABNORMAL LOW (ref 12.0–15.0)
MCH: 27.6 pg (ref 26.0–34.0)
MCHC: 31.1 g/dL (ref 30.0–36.0)
MCV: 88.5 fL (ref 78.0–100.0)
PLATELETS: 166 10*3/uL (ref 150–400)
RBC: 3.23 MIL/uL — ABNORMAL LOW (ref 3.87–5.11)
RDW: 20 % — AB (ref 11.5–15.5)
WBC: 10.5 10*3/uL (ref 4.0–10.5)

## 2013-05-17 LAB — BASIC METABOLIC PANEL
BUN: 54 mg/dL — ABNORMAL HIGH (ref 6–23)
BUN: 43 mg/dL — ABNORMAL HIGH (ref 6–23)
CALCIUM: 8.4 mg/dL (ref 8.4–10.5)
CO2: 25 mEq/L (ref 19–32)
CO2: 24 meq/L (ref 19–32)
Calcium: 8.6 mg/dL (ref 8.4–10.5)
Chloride: 113 mEq/L — ABNORMAL HIGH (ref 96–112)
Chloride: 109 mEq/L (ref 96–112)
Creatinine, Ser: 1.4 mg/dL — ABNORMAL HIGH (ref 0.50–1.10)
Creatinine, Ser: 1.18 mg/dL — ABNORMAL HIGH (ref 0.50–1.10)
GFR calc Af Amer: 38 mL/min — ABNORMAL LOW (ref >90)
GFR calc Af Amer: 47 mL/min — ABNORMAL LOW (ref >90)
GFR calc non Af Amer: 41 mL/min — ABNORMAL LOW (ref >90)
GFR, EST NON AFRICAN AMERICAN: 33 mL/min — AB (ref >90)
GLUCOSE: 145 mg/dL — AB (ref 70–99)
Glucose, Bld: 169 mg/dL — ABNORMAL HIGH (ref 70–99)
POTASSIUM: 4 meq/L (ref 3.7–5.3)
Potassium: 4 mEq/L (ref 3.7–5.3)
SODIUM: 151 meq/L — AB (ref 137–147)
Sodium: 145 mEq/L (ref 137–147)

## 2013-05-17 LAB — GLUCOSE, CAPILLARY
GLUCOSE-CAPILLARY: 160 mg/dL — AB (ref 70–99)
Glucose-Capillary: 135 mg/dL — ABNORMAL HIGH (ref 70–99)
Glucose-Capillary: 155 mg/dL — ABNORMAL HIGH (ref 70–99)
Glucose-Capillary: 205 mg/dL — ABNORMAL HIGH (ref 70–99)
Glucose-Capillary: 180 mg/dL — ABNORMAL HIGH (ref 70–99)
Glucose-Capillary: 128 mg/dL — ABNORMAL HIGH (ref 70–99)

## 2013-05-17 MED ORDER — GLUCERNA 1.2 CAL PO LIQD
1000.0000 mL | ORAL | Status: DC
  Administered 2013-05-17 – 2013-05-19 (×3): 1000 mL
  Filled 2013-05-17 (×3): qty 1000

## 2013-05-17 NOTE — Progress Notes (Signed)
Kristi Alexander [id number] Admission Data: 05/17/2013 5:58 PM Attending Provider: Annita Brod, MD  OMB:[id number], Gwynneth Munson, MD Consults/ Treatment Team:    [name] is a 80 y.o. female patient admitted from ED awake, alert  & orientated  X 3,  Prior, VSS - Blood pressure 124/71, pulse 93, temperature 98.2 F (36.8 C), temperature source Oral, resp. rate 22, height 5\' 2"  (1.575 m), weight 56.3 kg (124 lb 1.9 oz), SpO2 98.00%., O2    Room air Trach humidified during day, no c/o shortness of breath, no c/o chest pain, no distress noted. Tele # 16 placed and pt is currently running:normal sinus rhythm.   IV site WDL:  hand right, condition patent and no redness with a transparent dsg that's clean dry and intact.  Allergies:   Allergies  Allergen Reactions  . Penicillins Swelling     Past Medical History  Diagnosis Date  . Restless leg syndrome   . Hypertension   . Dyslipidemia   . History of appendectomy   . H/O: hysterectomy   . Cyst of breast, right, benign solitary   . Inguinal hernia   . History of lumbosacral spine surgery   . Arthritis   . HHNC (hyperglycemic hyperosmolar nonketotic coma) 05/14/2013   . Pt orientation to unit, room and routine.  Skin, clean-dry- intact without evidence of bruising, or skin tears.   No evidence of skin break down noted on exam. no rashes, no ecchymoses, no petechiae    Will cont to monitor and assist as needed.  Mikenzi Raysor Margaretha Sheffield, RN 05/17/2013 5:58 PM

## 2013-05-17 NOTE — Progress Notes (Signed)
Received report from [facility]

## 2013-05-17 NOTE — Evaluation (Signed)
Clinical/Bedside Swallow Evaluation Patient Details  Name: Kristi Alexander MRN: [id number] Date of Birth: 1925/10/22  Today's Date: 05/17/2013 Time: 1000-1030 SLP Time Calculation (min): 30 min  Past Medical History:  Past Medical History  Diagnosis Date  . Restless leg syndrome   . Hypertension   . Dyslipidemia   . History of appendectomy   . H/O: hysterectomy   . Cyst of breast, right, benign solitary   . Inguinal hernia   . History of lumbosacral spine surgery   . Arthritis   . HHNC (hyperglycemic hyperosmolar nonketotic coma) 05/14/2013   Past Surgical History:  Past Surgical History  Procedure Laterality Date  . Appendectomy    . Abdominal hysterectomy    . Laparotomy N/A 03/23/2013    Procedure: EXPLORATORY LAPAROTOMY;  Surgeon: Kristi Ober, MD;  Location: [place];  Service: General;  Laterality: N/A;  . Bowel resection N/A 03/23/2013    Procedure: SMALL BOWEL RESECTION;  Surgeon: Kristi Ober, MD;  Location: [place];  Service: General;  Laterality: N/A;  . Tracheostomy      feinstein  . Esophagogastroduodenoscopy N/A 04/01/2013    Procedure: ESOPHAGOGASTRODUODENOSCOPY (EGD);  Surgeon: Kristi Ober, MD;  Location: [place];  Service: General;  Laterality: N/A;  . Peg placement N/A 04/01/2013    Procedure: PERCUTANEOUS ENDOSCOPIC GASTROSTOMY (PEG) PLACEMENT;  Surgeon: Kristi Ober, MD;  Location: [facility] ENDOSCOPY;  Service: General;  Laterality: N/A;   HPI:  80 year old African American female past medical history of mild dementia, CVA status post PEG tube and chronic trach came from skilled nursing facility on 3/21 with a decreased level of responsiveness and found to have acute renal failure, blood sugar of 1400 (no previous history of diabetes reported) and also sodium level of 155 (when corrected for severe hyperglycemia, actual sodium level was 176!).  Patient started on IV fluids-free water and insulin and by 3/23 sodium down to 155, creatinine slowly improving and CBGs  normalizing. Patient herself is more awake and interactive  PMH: PEG and trach placement 03/23/13 (last adm) due to SBO and Vent dependence.  Just changed to #4 Shiley (cuffless) and on trach collar.  Still NPO with PEG TF's.   Assessment / Plan / Recommendation Clinical Impression  Patient appears to have normal swallow function, clinically.  Timely swallow initiation with good laryngeal elevation palpated.  Voice is clear after swallows, and there is no cough or throat clearing observed.  Discussed results with Dr. Maryland Alexander, who agrees to initiating Dys 3 diet with thin liquids and close monitoring with diet tolerance.  If any s/s of aspiration are noted, please order MBS.    Aspiration Risk  Mild    Diet Recommendation Dysphagia 3 (Mechanical Soft);Thin liquid   Liquid Administration via: Cup;Straw Medication Administration: Whole meds with puree Supervision: Patient able to self feed;Staff to assist with self feeding Compensations: Slow rate;Small sips/bites Postural Changes and/or Swallow Maneuvers: Seated upright 90 degrees;Upright 30-60 min after meal    Other  Recommendations Oral Care Recommendations: Oral care BID;Staff/trained caregiver to provide oral care Other Recommendations: Place PMSV during PO intake;Clarify dietary restrictions   Follow Up Recommendations  Skilled Nursing facility;24 hour supervision/assistance    Frequency and Duration min 2x/week  2 weeks   Pertinent Vitals/Pain No c/o pain; afebrile, CXR: resolving bibasilar infiltrate LS: upper lobes rhonchi; LL diminished.    SLP Swallow Goals   See care plan  Swallow Study Prior Functional Status       General HPI: 80 year old African  American female past medical history of mild dementia, CVA status post PEG tube and chronic trach came from skilled nursing facility on 3/21 with a decreased level of responsiveness and found to have acute renal failure, blood sugar of 1400 (no previous history of diabetes  reported) and also sodium level of 155 (when corrected for severe hyperglycemia, actual sodium level was 176!).  Patient started on IV fluids-free water and insulin and by 3/23 sodium down to 155, creatinine slowly improving and CBGs normalizing. Patient herself is more awake and interactive  PMH: PEG and trach placement 03/23/13 (last adm) due to SBO and Vent dependence.  Just changed to #4 Shiley (cuffless) and on trach collar.  Still NPO with PEG TF's. Type of Study: Bedside swallow evaluation Previous Swallow Assessment: None found in chart.  NPO and PEG apparrently due to SBO (placed 03/23/13) Diet Prior to this Study: NPO;PEG tube Temperature Spikes Noted: No Respiratory Status: Trach collar Trach Size and Type: #4;With PMSV in place History of Recent Intubation: No (In January of 2015) Behavior/Cognition: Alert;Cooperative;Pleasant mood Oral Cavity - Dentition: Dentures, top;Dentures, bottom Self-Feeding Abilities: Able to feed self;Needs set up Patient Positioning: Upright in chair Baseline Vocal Quality: Clear;Low vocal intensity Volitional Cough: Strong Volitional Swallow: Able to elicit    Oral/Motor/Sensory Function Overall Oral Motor/Sensory Function: Appears within functional limits for tasks assessed   Ice Chips Ice chips: Within functional limits Presentation: Spoon   Thin Liquid Thin Liquid: Within functional limits Presentation: Spoon;Cup;Straw;Self Fed    Nectar Thick Nectar Thick Liquid: Not tested   Honey Thick Honey Thick Liquid: Not tested   Puree Puree: Within functional limits Presentation: Spoon   Solid   GO    Solid: Within functional limits Presentation: Self Kristi Alexander, Kristi Alexander 05/17/2013,12:14 PM

## 2013-05-17 NOTE — Progress Notes (Signed)
[facility] PROGRESS NOTE  ABENA ERDMAN WJX:[id number] DOB: November 01, 1925 DOA: 05/14/2013 PCP: [name], MD  Interim summary:  80 year old African American female past medical history of mild dementia, status post partial small bowel removal secondary to infarction status post PEG tube and chronic trach, yet no diagnosis of diabetes came from skilled nursing facility on 3/21 with a decreased level of responsiveness and found to have acute renal failure, blood sugar of 1400 (no previous history of diabetes reported) and also sodium level of 155 (when corrected for severe hyperglycemia, actual sodium level was 176!).  Patient started on IV fluids-free water and insulin and by 3/23 sodium down to 155, creatinine slowly improving and CBGs normalizing. Patient herself is more awake and interactive.  Assessment/Plan: Principal Problem:   HHNC (hyperglycemic hyperosmolar nonketotic coma): CBGs better. Patient weaned off of insulin drip. Have given dose of Lantus and asked diabetes coordinator for assistance. Patient on chronic tube feeds. Appreciate nutrition help to change tube feedings so that less episodes of hyperglycemia.  Lantus and sliding scale need to be adjusted as current regimen starting today 3/24 will be dysphasia 3 3 times a day and tube feedings at night  Active Problems:   Acute renal failure:  Patient's baseline creatinine is normal, as evidenced by a creatinine of 0.71 on 2/27. Continue hydration. She continues to improve. This is all prerenal secondary to dehydration brought on by diuresis   History of small bowel resection: Done January 2015. Tube feedings restarted. Appreciate nutrition help and tube feeding type changed from Osmolite to Glucerna to have less hyperglycemia. Evaluated by speech therapy and safety eat. Have discussed with nutrition. Patient may have some variant of short bowel syndrome, this should not affect type of food but rather: In nutrient intake. Thus  plan will be to continue to feedings at night and decide over the next day or 2 about regimen as well as insulin coverage    Chronic respiratory failure: Continue trach mask and care.  Seen by pulmonary and tracheostomy exchanged     Acute hypernatremia: sodium finally starting to improve. Continue free water. D5W discontinued and now on half normal saline.  Continue until sodium normalized    Dehydration: Secondary to diuresis brought on by hyperglycemia, improved     Sinus tachycardia: Secondary to volume depletion and dehydration, improved   Chronic diastolic heart failure: Monitor BNP given need for aggressive IV hydration.  Recheck BNP in the morning  Code Status: Full code  Family Communication: Left message with daughter  Disposition Plan:  Transfer to floor   Consultants:  Critical care  Speech therapy  Nutrition  Procedures:  None  Antibiotics:  None  HPI/Subjective: Patient awake and alert. No complaints. No acute distress. Anxious to try to eat a she's not had any food by mouth and months.  Objective: Filed Vitals:   05/17/13 1214  BP: 125/60  Pulse: 91  Temp: 98.3 F (36.8 C)  Resp: 24    Intake/Output Summary (Last 24 hours) at 05/17/13 1332 Last data filed at 05/17/13 1000  Gross per 24 hour  Intake   1940 ml  Output    152 ml  Net   1788 ml   Filed Weights   05/14/13 2250 05/17/13 0500  Weight: 54.2 kg (119 lb 7.8 oz) 56.3 kg (124 lb 1.9 oz)    Exam:   General:   alert and oriented, No acute distress   Cardiovascular: Regular rate and rhythm, S1-S2  Respiratory: Clear to auscultation  bilaterally on trach mask  Abdomen: Soft, PEG tube nondistended, positive bowel sounds  Musculoskeletal: No clubbing or cyanosis or edema   Data Reviewed: Basic Metabolic Panel:  Recent Labs Lab 05/15/13 2240 05/16/13 0238 05/16/13 0649 05/16/13 1545 05/17/13 0257  NA 163* 163* 155* 154* 151*  K 4.3 3.7 4.1 3.8 4.0  CL 121* 121* 116* 114*  113*  CO2 29 29 25 28 25   GLUCOSE 256* 89 215* 148* 169*  BUN 86* 82* 77* 70* 54*  CREATININE 2.49* 2.51* 2.31* 1.89* 1.40*  CALCIUM 9.4 9.0 8.5 8.5 8.4   Liver Function Tests:  Recent Labs Lab 05/14/13 1918  AST 20  ALT 24  ALKPHOS 220*  BILITOT 0.5  PROT 8.0  ALBUMIN 3.3*   No results found for this basename: LIPASE, AMYLASE,  in the last 168 hours No results found for this basename: AMMONIA,  in the last 168 hours CBC:  Recent Labs Lab 05/14/13 1918 05/15/13 0826 05/16/13 1545 05/17/13 0257  WBC 15.1* 20.9* 14.1* 10.5  HGB 11.8* 11.6* 8.7* 8.9*  HCT 41.8 38.9 28.8* 28.6*  MCV 98.8 91.5 89.4 88.5  PLT 354 273 198 166   Cardiac Enzymes: No results found for this basename: CKTOTAL, CKMB, CKMBINDEX, TROPONINI,  in the last 168 hours BNP (last 3 results)  Recent Labs  04/18/13 0610 04/21/13 0701 04/21/13 1015  PROBNP 3112.0* 8813.0* 10944.0*   CBG:  Recent Labs Lab 05/16/13 2129 05/16/13 2309 05/17/13 0408 05/17/13 0829 05/17/13 1218  GLUCAP 124* 116* 135* 160* 155*    Recent Results (from the past 240 hour(s))  MRSA PCR SCREENING     Status: None   Collection Time    05/14/13 10:49 PM      Result Value Ref Range Status   MRSA by PCR NEGATIVE  NEGATIVE Final   Comment:            The GeneXpert MRSA Assay (FDA     approved for NASAL specimens     only), is one component of a     comprehensive MRSA colonization     surveillance program. It is not     intended to diagnose MRSA     infection nor to guide or     monitor treatment for     MRSA infections.     Studies: No results found.  Scheduled Meds: . amLODipine  5 mg Per Tube Daily  . antiseptic oral rinse  15 mL Mouth Rinse q12n4p  . aspirin  81 mg Oral Daily  . atorvastatin  20 mg Oral q1800  . budesonide  0.25 mg Nebulization Q6H  . chlorhexidine  15 mL Mouth Rinse BID  . famotidine  20 mg Per Tube QHS  . feeding supplement (GLUCERNA 1.2 CAL)  1,000 mL Per Tube Q24H  . ferrous  sulfate  325 mg Oral Q breakfast  . free water  100 mL Per Tube Q2H  . insulin aspart  0-9 Units Subcutaneous 6 times per day  . insulin glargine  5 Units Subcutaneous Daily   Continuous Infusions: . sodium chloride 100 mL/hr at 05/17/13 0400    Principal Problem:   [place]ts (hyperglycemic hyperosmolar nonketotic coma) Active Problems:   Acute renal failure   Hyperglycemia   Chronic respiratory failure   Acute hypernatremia   Dehydration   Sinus tachycardia    Time spent:  25  minutes    [facility] Pager [phone]. If 7PM-7AM, please contact night-coverage at [email or web address], password [facility]  05/17/2013, 1:32 PM  LOS: 3 days

## 2013-05-17 NOTE — Evaluation (Signed)
Physical Therapy Evaluation Patient Details Name: Kristi Alexander MRN: [id number] DOB: 1925-10-05 Today's Date: 05/17/2013   History of Present Illness   Kristi Alexander is a 80 y.o. female residing in the [facility] for Rehab therapy after she was hospitalized from 03/22/2013 until 04/19/2013 for a High Grade SBO of which she underwent an Exploratory Laparotomy with Small Bowel Resection on 03/23/2013 and developed Respiratory Failure requiring Tracheostomy placement and PEG tube placement who was sent from the SNF due to worsening confusion and progressive lethargy over the last 3 days.   She was evaluated in the ED and was found to have a glucose level of 1457, and a sodium level of 155. And a BUN/Cr of 90/1.91.  She was admininistered IVFs and started on the [facility] and referred for medical admission  Clinical Impression  Pt admitted with/for above problems.  Pt currently limited functionally due to the problems listed. ( See problems list.)   Pt will benefit from PT to maximize function and safety in order to get ready for next venue listed below.\     Follow Up Recommendations SNF    Equipment Recommendations  Other (comment) (TBA)    Recommendations for Other Services       Precautions / Restrictions Precautions Precautions: Fall Restrictions Weight Bearing Restrictions: No      Mobility  Bed Mobility                  Transfers Overall transfer level: Needs assistance   Transfers: Sit to/from Stand Sit to Stand: Min guard         General transfer comment: cued for safe use of hands  Ambulation/Gait Ambulation/Gait assistance: Min assist;Min guard Ambulation Distance (Feet): 120 Feet Assistive device: Rolling walker (2 wheeled) Gait Pattern/deviations: Step-through pattern Gait velocity: slower   General Gait Details: Generally steady, min assist to maneuver RW  Stairs            Wheelchair Mobility    Modified Rankin  (Stroke Patients Only)       Balance Overall balance assessment: Needs assistance Sitting-balance support: Feet supported Sitting balance-Leahy Scale: Good     Standing balance support: No upper extremity supported;During functional activity;Bilateral upper extremity supported Standing balance-Leahy Scale: Fair                       Pertinent Vitals/Pain sats on RA at 97%  HR at 103 bpm after gait.    Home Living Family/patient expects to be discharged to:: Skilled nursing facility                      Prior Function Level of Independence: Independent               Hand Dominance        Extremity/Trunk Assessment   Upper Extremity Assessment: Generalized weakness;Overall [facility] for tasks assessed           Lower Extremity Assessment: Overall WFL for tasks assessed;Generalized weakness         Communication   Communication: No difficulties;Passy-Muir valve;Tracheostomy  Cognition Arousal/Alertness: Awake/alert Behavior During Therapy: WFL for tasks assessed/performed Overall Cognitive Status: Within Functional Limits for tasks assessed                      General Comments      Exercises        Assessment/Plan    PT Assessment Patient needs continued  PT services  PT Diagnosis Generalized weakness;Other (comment) (decr activity tolerance)   PT Problem List Decreased strength;Decreased activity tolerance;Decreased mobility;Decreased knowledge of precautions;Decreased knowledge of use of DME;Cardiopulmonary status limiting activity  PT Treatment Interventions Gait training;Functional mobility training;Therapeutic activities;Patient/family education;DME instruction;Balance training   PT Goals (Current goals can be found in the Care Plan section) Acute Rehab PT Goals Patient Stated Goal: Eventually get back home, but be able to do for myself PT Goal Formulation: With patient Time For Goal Achievement: 05/24/13 Potential to  Achieve Goals: Good    Frequency Min 3X/week   Barriers to discharge        End of Session   Activity Tolerance: Patient tolerated treatment well Patient left: in chair;with call bell/phone within reach         Time: 1725-1750 PT Time Calculation (min): 25 min   Charges:   PT Evaluation $Initial PT Evaluation Tier I: 1 Procedure PT Treatments $Gait Training: 8-22 mins   PT G Codes:          Alvah Gilder, [name] 05/17/2013, 6:00 PM 05/17/2013  Donnella Sham, [place] [phone]  (pager)

## 2013-05-17 NOTE — Progress Notes (Signed)
Clinical Social Work Department BRIEF PSYCHOSOCIAL ASSESSMENT 05/17/2013  Patient:  Kristi Alexander, Kristi Alexander     Account Number:  [id number]     Admit date:  05/14/2013  Clinical Social Worker:  Freeman Caldron  Date/Time:  05/17/2013 02:58 PM  Referred by:  Physician  Date Referred:  05/17/2013 Referred for  SNF Placement   Other Referral:   Interview type:  Patient Other interview type:   Also spoke with pt's daughter who was at bedside during assessment.    PSYCHOSOCIAL DATA Living Status:  FACILITY Admitted from facility:  [place] Level of care:  [place] Primary support name:  Kristi Alexander ([phone]) Primary support relationship to patient:  SPOUSE Degree of support available:   Good--pt's daughter actively involved in care, and was visiting at bedside during assessment.    CURRENT CONCERNS Current Concerns  Post-Acute Placement   Other Concerns:    SOCIAL WORK ASSESSMENT / PLAN Notified by RN that pt from a facility. Pt was at [facility] before hospitalization, and I recognize her from recent admission. I went to pt's room to complete assessment, and daughter recognized me as well. Pt explained she has been at [facility] for rehab, and that she has some people she really likes at the facility but daughter expressed that the nursing staff is not very responsive to pt. Pt and daughter praised care pt receives at this hospital. I provided active listening and support as pt's daughter explained some of the staff are "unprofessional." I asked if there is anything I can do to advocate for pt, and daughter explained because pt will be returning for perhaps another week or so and then family believes she can come home, she does not want me to call and speak with the administrator. Daughter also states she comes to the facility once or twice a day to sit with pt and check on her, and she makes sure pt gets everything she needs, even if  it is not as timely as she would like. Pt/daughter state might be transferred to a floor unit today and likely discharge tomorrow. I have placed updated FL2 on her chart and will follow for discharge if pt remains on my unit. If transfers, I will provide handoff to pt's new CSW.   Assessment/plan status:  Psychosocial Support/Ongoing Assessment of Needs Other assessment/ plan:   Information/referral to community resources:   SNF [facility])    PATIENT'S/FAMILY'S RESPONSE TO PLAN OF CARE: Good--pt and daughter recognized me from a previous admission. Pt states she feels well today and pt/daughter believe pt might be released tomorrow. Pt will be discharged back to [place]. Pt and daughter thanked me for checking with pt and providing support. I utilized active listening, reflective listening, and solution-focused brief therapy techniques.       Ky Barban, MSW, [facility] Clinical Social Worker [phone]

## 2013-05-17 NOTE — Progress Notes (Signed)
Called report to [place] RN. Pt to transfer to 5W16

## 2013-05-17 NOTE — Progress Notes (Signed)
Pt transferred to 5W16. RN Ginger there. Pt placed on telemetry and oriented to room. Still has [employer] valve on. Suction and extra trach/obturator at head of bed. Daughter aware of transfer.

## 2013-05-17 NOTE — Evaluation (Signed)
Passy-Muir Speaking Valve - Evaluation Patient Details  Name: Kristi Alexander MRN: [id number] Date of Birth: 1925/08/18  Today's Date: 05/17/2013 Time: 0930-1000 SLP Time Calculation (min): 30 min  Past Medical History:  Past Medical History  Diagnosis Date  . Restless leg syndrome   . Hypertension   . Dyslipidemia   . History of appendectomy   . H/O: hysterectomy   . Cyst of breast, right, benign solitary   . Inguinal hernia   . History of lumbosacral spine surgery   . Arthritis   . HHNC (hyperglycemic hyperosmolar nonketotic coma) 05/14/2013   Past Surgical History:  Past Surgical History  Procedure Laterality Date  . Appendectomy    . Abdominal hysterectomy    . Laparotomy N/A 03/23/2013    Procedure: EXPLORATORY LAPAROTOMY;  Surgeon: Gwenyth Ober, MD;  Location: [place];  Service: General;  Laterality: N/A;  . Bowel resection N/A 03/23/2013    Procedure: SMALL BOWEL RESECTION;  Surgeon: Gwenyth Ober, MD;  Location: [place];  Service: General;  Laterality: N/A;  . Tracheostomy      feinstein  . Esophagogastroduodenoscopy N/A 04/01/2013    Procedure: ESOPHAGOGASTRODUODENOSCOPY (EGD);  Surgeon: Gwenyth Ober, MD;  Location: [place];  Service: General;  Laterality: N/A;  . Peg placement N/A 04/01/2013    Procedure: PERCUTANEOUS ENDOSCOPIC GASTROSTOMY (PEG) PLACEMENT;  Surgeon: Gwenyth Ober, MD;  Location: [facility] ENDOSCOPY;  Service: General;  Laterality: N/A;   HPI:  80 year old African American female past medical history of mild dementia, CVA status post PEG tube and chronic trach came from skilled nursing facility on 3/21 with a decreased level of responsiveness and found to have acute renal failure, blood sugar of 1400 (no previous history of diabetes reported) and also sodium level of 155 (when corrected for severe hyperglycemia, actual sodium level was 176!).  Patient started on IV fluids-free water and insulin and by 3/23 sodium down to 155, creatinine slowly improving and CBGs  normalizing. Patient herself is more awake and interactive  PMH: PEG and trach placement 03/23/13 (last adm) due to SBO and Vent dependence.  Just changed to #4 Shiley (cuffless) and on trach collar.  Still NPO with PEG TF's.   Assessment / Plan / Recommendation Clinical Impression  Patient tolerated PMSV beautifully, with O2 sats actually improving from 95-100% with valve in place.  Pt. was able to intermittently phonate without the valve, but voice is much stronger, and requires less effort for patient, with PMSV in place.  Pt. very appreciative, stating, "I'm so glad you came by today."    SLP Assessment  Patient needs continued Speech Lanaguage Pathology Services    Follow Up Recommendations  Skilled Nursing facility;24 hour supervision/assistance    Frequency and Duration min 2x/week  2 weeks   Pertinent Vitals/Pain Baseline HR 94, RR 30, SpO2 96%; no complaints of pain    SLP Goals Potential to Achieve Goals: Good   PMSV Trial  PMSV was placed for: 30 Able to redirect subglottic air through upper airway: Yes Able to Attain Phonation: Yes Voice Quality: Normal;Low vocal intensity Able to Expectorate Secretions: No attempts Level of Secretion Expectoration with PMSV: Not observed Breath Support for Phonation: Adequate Intelligibility: Intelligible Respirations During Trial: 20 SpO2 During Trial: 100 % Pulse During Trial: 94 Behavior: Alert;Cooperative;Expresses self well;Good eye contact;Responsive to questions   Tracheostomy Tube       Vent Dependency  FiO2 (%): 28 %    Cuff Deflation Trial     Quinn Axe  T 05/17/2013, 11:58 AM

## 2013-05-17 NOTE — Progress Notes (Signed)
   Name: RIONNA FELTES MRN: [id number] DOB: [birth year]    ADMISSION DATE:  05/14/2013 CONSULTATION DATE:  3/22  REFERRING MD :  Maryland Pink  PRIMARY SERVICE:  [facility]   CHIEF COMPLAINT:  Hypernatremia   BRIEF PATIENT DESCRIPTION:  Trach/peg dependant 79 yo who resides at SNF. Admitted on 3/21 w/ HHNC and severe hypernatremia. She was placed on Insulin,  Volume resuscitated, placed on MIVF of D5 1/2NS. PCCM asked to see 3/22 w/ concern that Na had risen from 155-->173 in spite of interventions.   SIGNIFICANT EVENTS / STUDIES:  none  LINES / TUBES: Chronic trach   CULTURES: none  ANTIBIOTICS: none  SUBJECTIVE:  No events overnight.  VITAL SIGNS: Temp:  [98.1 F (36.7 C)-98.8 F (37.1 C)] 98.3 F (36.8 C) (03/24 1214) Pulse Rate:  [83-99] 91 (03/24 1214) Resp:  [20-28] 24 (03/24 1214) BP: (116-133)/(48-60) 125/60 mmHg (03/24 1214) SpO2:  [95 %-100 %] 98 % (03/24 1214) FiO2 (%):  [28 %] 28 % (03/24 1214) Weight:  [124 lb 1.9 oz (56.3 kg)] 124 lb 1.9 oz (56.3 kg) (03/24 0500)  PHYSICAL EXAMINATION: General:  Frail elderly female, awake, not in distress.  Neuro:  Awake, no focal def  HEENT:  Trach uncuffed. Site clean  Cardiovascular:  rrr Lungs:  Scattered rhonchi  Abdomen:  PEG + bowel sounds  Musculoskeletal:  Intact  Skin:  Dry no edema   Recent Labs Lab 05/16/13 0649 05/16/13 1545 05/17/13 0257  NA 155* 154* 151*  K 4.1 3.8 4.0  CL 116* 114* 113*  CO2 25 28 25   BUN 77* 70* 54*  CREATININE 2.31* 1.89* 1.40*  GLUCOSE 215* 148* 169*    Recent Labs Lab 05/15/13 0826 05/16/13 1545 05/17/13 0257  HGB 11.6* 8.7* 8.9*  HCT 38.9 28.8* 28.6*  WBC 20.9* 14.1* 10.5  PLT 273 198 166   No results found.  ASSESSMENT / PLAN:  Severe hypernatremia in setting of dehydration following admission for hyperosmolar nonketotic hyperglycemic state. Suspect she had significant glucose osmotic diuresis and resultant free water loss.  Rec: - Continue free water  replacement. Current deficit originally calculated at 4.6 liters. Would aim to replace ~ 1/2 of this volume over 24 hrs (done). Need to be careful not to replace to rapidly. Agree w/ 125 ml/hr. - Would trend chemistry every 8 hrs and replace electrolytes as indicated. - Will need to adjust insulin to account for additional dextrose w/ free water administration  - No ICU transfer, improving.  Mild AKI Suspect this should improve w/ hydration  Plan - Cont current rx. - Renal dose meds. - F/u chemistry.  Tracheostomy dependence Called back to evaluate for PMV and progression of trach care. Plan - Cont routine trach care - Trach changed. - Speech pathology note appreciated, may proceed with PMV after dropping down cuff.  Dysphagia Plan - Cont Tube feeds.   No further interventions beyond delineated above.  PCCM will sign off, please call back if needed.  Rush Farmer, M.D. [facility] Pulmonary/Critical Care Medicine. Pager: [phone]. After hours pager: [phone].

## 2013-05-18 DIAGNOSIS — J9600 Acute respiratory failure, unspecified whether with hypoxia or hypercapnia: Secondary | ICD-10-CM

## 2013-05-18 LAB — GLUCOSE, CAPILLARY
GLUCOSE-CAPILLARY: 127 mg/dL — AB (ref 70–99)
Glucose-Capillary: 124 mg/dL — ABNORMAL HIGH (ref 70–99)
Glucose-Capillary: 137 mg/dL — ABNORMAL HIGH (ref 70–99)
Glucose-Capillary: 138 mg/dL — ABNORMAL HIGH (ref 70–99)
Glucose-Capillary: 147 mg/dL — ABNORMAL HIGH (ref 70–99)
Glucose-Capillary: 159 mg/dL — ABNORMAL HIGH (ref 70–99)
Glucose-Capillary: 174 mg/dL — ABNORMAL HIGH (ref 70–99)

## 2013-05-18 LAB — BASIC METABOLIC PANEL
BUN: 28 mg/dL — ABNORMAL HIGH (ref 6–23)
CALCIUM: 8.4 mg/dL (ref 8.4–10.5)
CO2: 24 meq/L (ref 19–32)
Chloride: 111 mEq/L (ref 96–112)
Creatinine, Ser: 0.98 mg/dL (ref 0.50–1.10)
GFR calc Af Amer: 59 mL/min — ABNORMAL LOW (ref >90)
GFR calc non Af Amer: 51 mL/min — ABNORMAL LOW (ref >90)
GLUCOSE: 162 mg/dL — AB (ref 70–99)
Potassium: 3.8 mEq/L (ref 3.7–5.3)
Sodium: 145 mEq/L (ref 137–147)

## 2013-05-18 LAB — PRO B NATRIURETIC PEPTIDE: PRO B NATRI PEPTIDE: 1083 pg/mL — AB (ref 0–450)

## 2013-05-18 MED ORDER — FUROSEMIDE 40 MG PO TABS
40.0000 mg | ORAL_TABLET | Freq: Once | ORAL | Status: AC
  Administered 2013-05-18: 40 mg via ORAL
  Filled 2013-05-18 (×2): qty 1

## 2013-05-18 NOTE — Progress Notes (Addendum)
NUTRITION CONSULT/FOLLOW UP    INTERVENTION:  Initiate 48 hour calorie count Continue nocturnal TF regimen: Glucerna 1.2 formula at 60 ml/hr x 12 hours (infuse from 7 PM--7 AM) via PEG tube to provide 864 kcals, 40 gm protein, 581 ml of free water (this is meeting ~ 50% of estimated kcal and protein needs) RD to follow for nutrition care plan  NUTRITION DIAGNOSIS: Inadequate oral intake now related to poor appetite as evidenced by patient report, ongoing  Goal: Pt to meet >/= 90% of their estimated nutrition needs, currently unmet   Monitor:  TF regimen & tolerance, PO intake, weight, labs, I/O's  ASSESSMENT: 80 y.o. female residing in the [facility] for Rehab therapy after she was hospitalized from 03/22/2013 until 04/19/2013 for a High Grade SBO of which she underwent an Exploratory Laparotomy with Small Bowel Resection on 03/23/2013 and developed Respiratory Failure requiring Tracheostomy placement and PEG tube placement who was sent from the SNF due to worsening confusion and progressive lethargy over the last 3 days. She was evaluated in the ED and was found to have a glucose level of 1457, and a sodium level of 155. And a BUN/Cr of 90/1.91. She was admininistered IVFs and started on the [facility] and referred for medical admission.   Patient transferred from 2C-Stepdown to 5W-Medical 3/24.  Patient s/p bedside swallow evaluation 3/24.  Initiated on a Dysphagia 3, thin liquid diet.  This RD spoke with Dr. Maryland Pink 3/24 regarding changing pt to nocturnal TF regimen at this time.    Re-assessment ongoing and RD consulted for 48 hr calorie count.  RD spoke with pt's daughter, [name] at bedside.  Reports pt's PO intake is very poor at this time.  Had bites with breakfast this AM.  Also states that her Mom is "just not used to eating" as it's been several weeks she's had anything by mouth.  Patient not very interested in eating with RD interview.  Reports her appetite  is poor and she doesn't want to "fill her belly up".  Height: Ht Readings from Last 1 Encounters:  05/14/13 5\' 2"  (1.575 m)    Weight: Wt Readings from Last 10 Encounters:  05/18/13 133 lb (60.328 kg)  04/22/13 134 lb 4.2 oz (60.9 kg)  04/19/13 157 lb 13.6 oz (71.6 kg)  04/19/13 157 lb 13.6 oz (71.6 kg)  04/19/13 157 lb 13.6 oz (71.6 kg)  01/19/13 144 lb (65.318 kg)  07/21/12 145 lb (65.772 kg)  06/17/11 143 lb (64.864 kg)  05/16/13    RD re-weighed pt on bed scale at 137 lbs  BMI:  Body mass index is 24.32 kg/(m^2).  Estimated Nutritional Needs: Kcal: 1600-1800 Protein: 80-90 gm Fluid: 1.6-1.8 L  Skin: Intact  Diet Order: Dysphagia 3, thin liquids   Intake/Output Summary (Last 24 hours) at 05/18/13 1021 Last data filed at 05/18/13 0216  Gross per 24 hour  Intake    300 ml  Output      4 ml  Net    296 ml   Labs:   Recent Labs Lab 05/17/13 0257 05/17/13 1430 05/18/13 0520  NA 151* 145 145  K 4.0 4.0 3.8  CL 113* 109 111  CO2 25 24 24   BUN 54* 43* 28*  CREATININE 1.40* 1.18* 0.98  CALCIUM 8.4 8.6 8.4  GLUCOSE 169* 145* 162*    CBG (last 3)   Recent Labs  05/17/13 2356 05/18/13 0403 05/18/13 0739  GLUCAP 127* 159* 138*    Scheduled Meds: .  amLODipine  5 mg Per Tube Daily  . antiseptic oral rinse  15 mL Mouth Rinse q12n4p  . aspirin  81 mg Oral Daily  . atorvastatin  20 mg Oral q1800  . budesonide  0.25 mg Nebulization Q6H  . chlorhexidine  15 mL Mouth Rinse BID  . famotidine  20 mg Per Tube QHS  . feeding supplement (GLUCERNA 1.2 CAL)  1,000 mL Per Tube Q24H  . ferrous sulfate  325 mg Oral Q breakfast  . free water  100 mL Per Tube Q2H  . insulin aspart  0-9 Units Subcutaneous 6 times per day  . insulin glargine  5 Units Subcutaneous Daily    Continuous Infusions: . sodium chloride 100 mL/hr at 05/18/13 9811    Past Medical History  Diagnosis Date  . Restless leg syndrome   . Hypertension   . Dyslipidemia   . History of  appendectomy   . H/O: hysterectomy   . Cyst of breast, right, benign solitary   . Inguinal hernia   . History of lumbosacral spine surgery   . Arthritis   . HHNC (hyperglycemic hyperosmolar nonketotic coma) 05/14/2013    Past Surgical History  Procedure Laterality Date  . Appendectomy    . Abdominal hysterectomy    . Laparotomy N/A 03/23/2013    Procedure: EXPLORATORY LAPAROTOMY;  Surgeon: Gwenyth Ober, MD;  Location: [place];  Service: General;  Laterality: N/A;  . Bowel resection N/A 03/23/2013    Procedure: SMALL BOWEL RESECTION;  Surgeon: Gwenyth Ober, MD;  Location: [place];  Service: General;  Laterality: N/A;  . Tracheostomy      feinstein  . Esophagogastroduodenoscopy N/A 04/01/2013    Procedure: ESOPHAGOGASTRODUODENOSCOPY (EGD);  Surgeon: Gwenyth Ober, MD;  Location: [facility] ENDOSCOPY;  Service: General;  Laterality: N/A;  . Peg placement N/A 04/01/2013    Procedure: PERCUTANEOUS ENDOSCOPIC GASTROSTOMY (PEG) PLACEMENT;  Surgeon: Gwenyth Ober, MD;  Location: [place];  Service: General;  Laterality: N/A;    Arthur Holms, RD, LDN Pager #: [phone] After-Hours Pager #: [phone]

## 2013-05-18 NOTE — Progress Notes (Signed)
Speech Language Pathology Treatment: Dysphagia;Passy Muir Speaking valve  Patient Details Name: Kristi Alexander MRN: [id number] DOB: 06-04-1925 Today's Date: 05/18/2013 Time: 4818-5909 SLP Time Calculation (min): 15 min  Assessment / Plan / Recommendation Clinical Impression  Pt tolerating PMV and current diet without overt difficulty.  PMSV remains on all waking hours. Pt reports poor appetite, but does not exhibit overt s/s aspiration.  Will continue to provide education regarding use, care, placement and removal of PMV to increase independence with it.  Good voice quality noted.   HPI HPI: 80 year old African American female past medical history of mild dementia, CVA status post PEG tube and chronic trach came from skilled nursing facility on 3/21 with a decreased level of responsiveness and found to have acute renal failure, blood sugar of 1400 (no previous history of diabetes reported) and also sodium level of 155 (when corrected for severe hyperglycemia, actual sodium level was 176!).  Patient started on IV fluids-free water and insulin and by 3/23 sodium down to 155, creatinine slowly improving and CBGs normalizing. Patient herself is more awake and interactive  PMH: PEG and trach placement 03/23/13 (last adm) due to SBO and Vent dependence.  Just changed to #4 Shiley (cuffless) and on trach collar.  Follow up to BSE/PMSV eval for tolerance.   Pertinent Vitals VSS, no pain reported  SLP Plan  Goals updated;Continue with current plan of care    Recommendations Diet recommendations: Dysphagia 3 (mechanical soft);Thin liquid Liquids provided via: Straw;Cup Medication Administration: Whole meds with puree Supervision: Patient able to self feed;Staff to assist with self feeding Compensations: Slow rate;Small sips/bites Postural Changes and/or Swallow Maneuvers: Seated upright 90 degrees;Upright 30-60 min after meal      Patient may use Passy-Muir Speech Valve: During all waking hours (remove  during sleep);During PO intake/meals PMSV Supervision: Intermittent       Oral Care Recommendations: Oral care BID;Staff/trained caregiver to provide oral care Follow up Recommendations: Skilled Nursing facility;24 hour supervision/assistance Plan: Goals updated;Continue with current plan of care    [place] B. [facility], CCC-SLP [phone]  Shonna Chock 05/18/2013, 9:56 AM

## 2013-05-18 NOTE — Progress Notes (Addendum)
[facility] PROGRESS NOTE  ZUMA HUST OEU:[id number] DOB: 30-May-1925 DOA: 05/14/2013 PCP: [name], MD  Assessment/Plan: 80 year old African American female past medical history of mild dementia, status post partial small bowel removal secondary to infarction status post PEG tube and chronic trach, yet no diagnosis of diabetes came from skilled nursing facility on 3/21 with a decreased level of responsiveness and found to have acute renal failure, blood sugar of 1400 (no previous history of diabetes reported) and also sodium level of 155 (when corrected for severe hyperglycemia, actual sodium level was 176!).   Principal Problem:  1. HHNC (hyperglycemic hyperosmolar nonketotic coma): CBGs better. HA1C-8.9 -resolved'; cont lantus +ISS   2. Acute renal failure likely prerenal+ diuretics -resolved; resume lasix for CHF, monitor   3. History of small bowel resection: (January 2015). Patient was on tube feeding at SNF -restarted PO intake, cont tube feeding QHS; if tolerated wean tube feeding   4. Chronic respiratory failure: Continue trach mask and care. Seen by pulmonary and tracheostomy exchanged  -need trach clinic in 3 months    5. Acute hypernatremia likely due to dehydration+diuresis  -resolved; cont monitoring   6. Chronic diastolic heart failure, echo (03/2013) :VEF 15%, LVH, diastolic dysfunction, mild pulmonary HTN -mild hypervolemia on exam; I/O +'; restart lasix; titrate pere response    7. Chronic anemia; no s/s of bleeding; cont iron  Code Status: full Family Communication: d/w patient, updated her daughter at the bedside (indicate person spoken with, relationship, and if by phone, the number) Disposition Plan: SNF 24-48 hours    Consultants:  pulmonology   Procedures:  none  Antibiotics:  None  (indicate start date, and stop date if known)  HPI/Subjective: alert  Objective: Filed Vitals:   05/18/13 0906  BP:   Pulse: 95  Temp:   Resp:  16    Intake/Output Summary (Last 24 hours) at 05/18/13 1036 Last data filed at 05/18/13 0216  Gross per 24 hour  Intake    300 ml  Output      4 ml  Net    296 ml   Filed Weights   05/17/13 0500 05/18/13 0405 05/18/13 0407  Weight: 56.3 kg (124 lb 1.9 oz) 60.328 kg (133 lb) 60.328 kg (133 lb)    Exam:   General:  alert  Cardiovascular: s1,s2 rrr  Respiratory: LL crackles   Abdomen: soft, NT  Musculoskeletal: mild edema   Data Reviewed: Basic Metabolic Panel:  Recent Labs Lab 05/16/13 0649 05/16/13 1545 05/17/13 0257 05/17/13 1430 05/18/13 0520  NA 155* 154* 151* 145 145  K 4.1 3.8 4.0 4.0 3.8  CL 116* 114* 113* 109 111  CO2 25 28 25 24 24   GLUCOSE 215* 148* 169* 145* 162*  BUN 77* 70* 54* 43* 28*  CREATININE 2.31* 1.89* 1.40* 1.18* 0.98  CALCIUM 8.5 8.5 8.4 8.6 8.4   Liver Function Tests:  Recent Labs Lab 05/14/13 1918  AST 20  ALT 24  ALKPHOS 220*  BILITOT 0.5  PROT 8.0  ALBUMIN 3.3*   No results found for this basename: LIPASE, AMYLASE,  in the last 168 hours No results found for this basename: AMMONIA,  in the last 168 hours CBC:  Recent Labs Lab 05/14/13 1918 05/15/13 0826 05/16/13 1545 05/17/13 0257  WBC 15.1* 20.9* 14.1* 10.5  HGB 11.8* 11.6* 8.7* 8.9*  HCT 41.8 38.9 28.8* 28.6*  MCV 98.8 91.5 89.4 88.5  PLT 354 273 198 166   Cardiac Enzymes: No results found for this  basename: CKTOTAL, CKMB, CKMBINDEX, TROPONINI,  in the last 168 hours BNP (last 3 results)  Recent Labs  04/21/13 0701 04/21/13 1015 05/18/13 0520  PROBNP 8813.0* 10944.0* 1083.0*   CBG:  Recent Labs Lab 05/17/13 1746 05/17/13 2014 05/17/13 2356 05/18/13 0403 05/18/13 0739  GLUCAP 180* 128* 127* 159* 138*    Recent Results (from the past 240 hour(s))  MRSA PCR SCREENING     Status: None   Collection Time    05/14/13 10:49 PM      Result Value Ref Range Status   MRSA by PCR NEGATIVE  NEGATIVE Final   Comment:            The GeneXpert MRSA  Assay (FDA     approved for NASAL specimens     only), is one component of a     comprehensive MRSA colonization     surveillance program. It is not     intended to diagnose MRSA     infection nor to guide or     monitor treatment for     MRSA infections.     Studies: No results found.  Scheduled Meds: . amLODipine  5 mg Per Tube Daily  . antiseptic oral rinse  15 mL Mouth Rinse q12n4p  . aspirin  81 mg Oral Daily  . atorvastatin  20 mg Oral q1800  . budesonide  0.25 mg Nebulization Q6H  . chlorhexidine  15 mL Mouth Rinse BID  . famotidine  20 mg Per Tube QHS  . feeding supplement (GLUCERNA 1.2 CAL)  1,000 mL Per Tube Q24H  . ferrous sulfate  325 mg Oral Q breakfast  . free water  100 mL Per Tube Q2H  . furosemide  40 mg Oral Once  . insulin aspart  0-9 Units Subcutaneous 6 times per day  . insulin glargine  5 Units Subcutaneous Daily   Continuous Infusions:   Principal Problem:   [place] (hyperglycemic hyperosmolar nonketotic coma) Active Problems:   Acute renal failure   Hyperglycemia   Chronic respiratory failure   Acute hypernatremia   Dehydration   Sinus tachycardia   Chronic diastolic heart failure   Tracheostomy status    Time spent: >35 minutes     Kinnie Feil  [facility] Pager [phone]. If 7PM-7AM, please contact night-coverage at [email or web address], password [facility] 05/18/2013, 10:36 AM  LOS: 4 days

## 2013-05-18 NOTE — Progress Notes (Signed)
Pt vomiting. Cleaned pt's face & gave zofran. Pt with no further complaints or needs at this time

## 2013-05-18 NOTE — Evaluation (Signed)
Occupational Therapy Evaluation Patient Details Name: Kristi Alexander MRN: [id number] DOB: 07/13/1925 Today's Date: 05/18/2013    History of Present Illness  Kristi Alexander is a 80 y.o. female residing in the [facility] for Rehab therapy after she was hospitalized from 03/22/2013 until 04/19/2013 for a High Grade SBO of which she underwent an Exploratory Laparotomy with Small Bowel Resection on 03/23/2013 and developed Respiratory Failure requiring Tracheostomy placement and PEG tube placement who was sent from the SNF due to worsening confusion and progressive lethargy over the last 3 days.   She was evaluated in the ED and was found to have a glucose level of 1457, and a sodium level of 155. And a BUN/Cr of 90/1.91.  She was admininistered IVFs and started on the [facility] and referred for medical admission   Clinical Impression   Pt demos decline in function with ADLs and ADL mobility safety and would benefit from acute OT services to address impairments to increase level of function and safety. Pt plans to return to SNF for continued rehab    Follow Up Recommendations  SNF;Supervision/Assistance - 24 hour    Equipment Recommendations  None recommended by OT     Recommendations for Other Services       Precautions / Restrictions Precautions Precautions: Fall Restrictions Weight Bearing Restrictions: No      Mobility Bed Mobility               General bed mobility comments: pt up in recliner  Transfers Overall transfer level: Needs assistance Equipment used: Rolling walker (2 wheeled) Transfers: Sit to/from Stand Sit to Stand: Min guard         General transfer comment: cued for safe use of hands    Balance Overall balance assessment: Needs assistance Sitting-balance support: No upper extremity supported;Feet supported Sitting balance-Leahy Scale: Good     Standing balance support: Single extremity supported;No upper extremity  supported;During functional activity Standing balance-Leahy Scale: Fair                      ADL   Grooming: Wash/dry hands;Wash/dry face;Standing   Upper Body Dressing : Supervision/safety;Set up Lower Body Bathing: Minimal assistance;Sit to/from stand;Sitting/lateral leans Lower Body Dressing: Moderate assistance;Sitting/lateral leans;Sit to/from stand Toilet Transfer: Min guard;BSC Toileting- Water quality scientist and Hygiene: Minimal assistance;Sit to/from stand;Sitting/lateral lean     General ADL Comments: verbal cues for correct hand placement     Vision                     Perception Perception Perception Tested?: No   Praxis Praxis Praxis tested?: Not tested    Pertinent Vitals/Pain No c/o pain O2 Sats 99% BP 143/63     Hand Dominance Right   Extremity/Trunk Assessment Upper Extremity Assessment Upper Extremity Assessment: Overall WFL for tasks assessed;Generalized weakness   Lower Extremity Assessment Lower Extremity Assessment: Defer to PT evaluation       Communication Communication Communication: No difficulties;Passy-Muir valve;Tracheostomy   Cognition Arousal/Alertness: Awake/alert Behavior During Therapy: WFL for tasks assessed/performed Overall Cognitive Status: Within Functional Limits for tasks assessed                     General Comments             Home Living Family/patient expects to be discharged to:: Skilled nursing facility  Prior Functioning/Environment Level of Independence: Independent             OT Diagnosis:     OT Problem List: Decreased strength;Impaired balance (sitting and/or standing);Decreased activity tolerance   OT Treatment/Interventions: Self-care/ADL training;Therapeutic exercise;Neuromuscular education;Patient/family education;Therapeutic activities;Balance training    OT Goals(Current goals can be found in the care plan  section) ADL Goals Pt Will Perform Grooming: with set-up;with supervision;standing Pt Will Perform Lower Body Bathing: with min assist;with min guard assist;sitting/lateral leans;sit to/from stand Pt Will Perform Lower Body Dressing: with min assist;sitting/lateral leans;sit to/from stand Pt Will Transfer to Toilet: with supervision;grab bars;regular height toilet;with min guard assist;ambulating Pt Will Perform Toileting - Clothing Manipulation and hygiene: with min guard assist;with supervision;sit to/from stand;sitting/lateral leans  OT Frequency: Min 2X/week   Barriers to D/C:    none       End of Session: Equipment Utilized During Treatment: Rolling walker;Other (comment) (BSC)  Activity Tolerance: Patient tolerated treatment well Patient left: in chair;with call bell/phone within reach;with family/visitor present   Time: 3568-6168 OT Time Calculation (min): 33 min Charges:  OT General Charges $OT Visit: 1 Procedure OT Evaluation $Initial OT Evaluation Tier I: 1 Procedure OT Treatments $Self Care/Home Management : 8-22 mins $Therapeutic Activity: 8-22 mins G-Codes:    Britt Bottom 05/18/2013, 1:52 PM

## 2013-05-18 NOTE — Progress Notes (Signed)
Pt transferred to 5W. Provided unit CSW a handoff. I am signing off.   Ky Barban, MSW, [facility] Clinical Social Worker [phone]

## 2013-05-19 LAB — GLUCOSE, CAPILLARY
Glucose-Capillary: 161 mg/dL — ABNORMAL HIGH (ref 70–99)
Glucose-Capillary: 148 mg/dL — ABNORMAL HIGH (ref 70–99)
Glucose-Capillary: 139 mg/dL — ABNORMAL HIGH (ref 70–99)

## 2013-05-19 MED ORDER — BOOST / RESOURCE BREEZE PO LIQD: 1.0000 | Freq: Two times a day (BID) | ORAL | Status: DC

## 2013-05-19 MED ORDER — INSULIN GLARGINE 100 UNIT/ML ~~LOC~~ SOLN: 5.0000 [IU] | Freq: Every day | SUBCUTANEOUS | Status: DC

## 2013-05-19 MED ORDER — FUROSEMIDE 40 MG PO TABS: 40.0000 mg | ORAL_TABLET | Freq: Every day | ORAL | Status: DC

## 2013-05-19 NOTE — Clinical Social Work Note (Signed)
Per MD patient ready to DC to [facility]. RN, patient, and facility notified of DC. RN given number for report. DC packet on chart. [facility] transport requested for patient. CSW signing off.  Liz Beach, [place], [place], [phone]

## 2013-05-19 NOTE — Progress Notes (Signed)
Kristi Alexander to be D/C'd Skilled nursing facility per MD order.  Discussed with the patient and all questions fully answered.    Medication List    STOP taking these medications       furosemide 10 MG/ML solution  Commonly known as:  LASIX  Replaced by:  furosemide 40 MG tablet     levofloxacin 750 MG tablet  Commonly known as:  LEVAQUIN      TAKE these medications       amLODipine 5 MG tablet  Commonly known as:  NORVASC  5 mg by Gastric Tube route daily.     aspirin 81 MG tablet  81 mg by Gastric Tube route daily.     atorvastatin 20 MG tablet  Commonly known as:  LIPITOR  20 mg by Gastric Tube route every evening. 5pm     budesonide 0.25 MG/2ML nebulizer solution  Commonly known as:  PULMICORT  0.25 mg by Tracheal Tube route every 6 (six) hours.     famotidine 40 MG/5ML suspension  Commonly known as:  PEPCID  20 mg by Gastric Tube route at bedtime.     feeding supplement (OSMOLITE 1.2 CAL) Liqd  Place 1,000 mLs into feeding tube continuous. 85 ml/hr 12 noon to 8am (allow 4 hours downtime for therapy)     ferrous sulfate 325 (65 FE) MG tablet  325 mg by Gastric Tube route daily.     furosemide 40 MG tablet  Commonly known as:  LASIX  Take 1 tablet (40 mg total) by mouth daily.     insulin glargine 100 UNIT/ML injection  Commonly known as:  LANTUS  Inject 0.05 mLs (5 Units total) into the skin daily.     potassium chloride 20 MEQ/15ML (10%) solution  20 mEq by Gastric Tube route 3 (three) times daily. 9am, 2pm, 9pm        VVS. IV catheter discontinued intact. Site without signs and symptoms of complications. Dressing and pressure applied. Trach site intact. Peg site intact.   Patient escorted via EMS to SNF.   Delman Cheadle 05/19/2013 2:13 PM

## 2013-05-19 NOTE — Progress Notes (Signed)
Calorie Count Note  48 hour calorie count ordered.  Diet: Dysphagia 3 with thin liquids TF: Glucerna 1.2 at 60 ml/hr x 12 hours q PM - provides 864 kcals, 40 gm protein, 581 ml of free water  Supplements: none  Breakfast: 100 kcal Lunch: 200 kcal, 3 grams protein Dinner: 200 kcal, 5 grams protein Supplements: none  Total intake: 1364 kcal (85% of minimum estimated needs)  48 protein (60% of minimum estimated needs)  Patient reports that she is doing the best she can with eating. Encouraged continued adequate intake.  Estimated Nutritional Needs:  Kcal: 1600 - 1800  Protein: 80  -90 grams  Fluid: 1.6-1.8 L  Nutrition Dx: Pt to meet >/= 90% of their estimated nutrition needs, currently unmet.  Goal: Inadequate oral intake now related to poor appetite as evidenced by patient report, ongoing.  Intervention:  1. Add Resource Breeze po BID, each supplement provides 250 kcal and 9 grams of protein. RD discussed available supplements and this is the only supplement pt is agreeable to at this time. 2. Continue current TF regimen of Glucerna 1.2 at 60 ml/hr x 12 hours. 3. RD to follow-up with calorie count and make further recommendations at next date.  Inda Coke MS, RD, LDN Inpatient Registered Dietitian Pager: [phone] After-hours pager: [phone]

## 2013-05-19 NOTE — Care Management Note (Signed)
    Page 1 of 1   05/19/2013     11:54:33 AM   CARE MANAGEMENT NOTE 05/19/2013  Patient:  Kristi Alexander, Kristi Alexander   Account Number:  [id number]  Date Initiated:  05/19/2013  Documentation initiated by:  Tomi Bamberger  Subjective/Objective Assessment:   dx hyperglcemia  admti- from [facility]     Action/Plan:   Anticipated DC Date:  05/19/2013   Anticipated DC Plan:  [facility] referral  Clinical Social Worker      DC Planning Services  CM consult      Choice offered to / List presented to:             Status of service:  Completed, signed off Medicare Important Message given?   (If response is "NO", the following Medicare IM given date fields will be blank) Date Medicare IM given:   Date Additional Medicare IM given:    Discharge Disposition:  [place]  Per UR Regulation:  Reviewed for med. necessity/level of care/duration of stay  If discussed at [place] of Stay Meetings, dates discussed:    Comments:

## 2013-05-19 NOTE — Progress Notes (Signed)
Patient left glasses in room. Called family and [facility].

## 2013-05-19 NOTE — Discharge Summary (Signed)
Physician Discharge Summary  Kristi Alexander IRS:[id number] DOB: [birth year] DOA: 05/14/2013  PCP: Kristi Hey, MD  Admit date: 05/14/2013 Discharge date: 05/19/2013  Time spent: >35 minutes  Recommendations for Outpatient Follow-up:  F/u with pulmonologist in 3-4 weeks Discharge Diagnoses:  Principal Problem:   HHNC (hyperglycemic hyperosmolar nonketotic coma) Active Problems:   Acute renal failure   Hyperglycemia   Chronic respiratory failure   Acute hypernatremia   Dehydration   Sinus tachycardia   Chronic diastolic heart failure   Tracheostomy status   Discharge Condition: stable   Diet recommendation: on tube fleeding  Filed Weights   05/18/13 0405 05/18/13 0407 05/19/13 0401  Weight: 60.328 kg (133 lb) 60.328 kg (133 lb) 62.551 kg (137 lb 14.4 oz)    History of present illness:  80 year old African American female past medical history of mild dementia, status post partial small bowel removal secondary to infarction status post PEG tube and chronic trach, yet no diagnosis of diabetes came from skilled nursing facility on 3/21 with a decreased level of responsiveness and found to have acute renal failure, blood sugar of 1400 (no previous history of diabetes reported) and also sodium level of 155 (when corrected for severe hyperglycemia, actual sodium level was 176!).    Hospital Course: Principal Problem:  1. HHNC (hyperglycemic hyperosmolar nonketotic coma): CBGs better. HA1C-8.9  -resolved'; cont lantus; titrate per response   2. Acute renal failure likely prerenal+ diuretics  -resolved;   3. History of small bowel resection: (January 2015). Patient was on tube feeding at SNF  -restarted PO intake, cont tube feeding QHS; if tolerated wean tube feeding  4. Chronic respiratory failure: Continue trach mask and care. Seen by pulmonary and tracheostomy exchanged  -need trach clinic in 3 months; pulmonology follow up outpatient 3-4 weeks   5. Acute hypernatremia  likely due to dehydration+diuresis  -resolved; cont monitoring  6. Chronic diastolic heart failure, echo (03/2013) :VEF 00%, LVH, diastolic dysfunction, mild pulmonary HTN  -restarted PO lasix for 3 days for chronic CHF; titrate pere response  7. Chronic anemia; no s/s of bleeding; cont iron     Consultants:  pulmonology  Procedures:  none Antibiotics:  None (indicate start date, and stop date if known)   Discharge Exam: Filed Vitals:   05/19/13 0949  BP: 142/64  Pulse:   Temp:   Resp:     General: alert Cardiovascular: s1,s2 rrr Respiratory: CTA BL, few crackles in LL  Discharge Instructions  Discharge Orders   Future Appointments Provider Department Dept Phone   05/24/2013 11:00 AM Kristi Ober, MD [facility] [place]   07/19/2013 2:30 PM Kristi Bible, NP [facility] [phone]   Future Orders Complete By Expires   Diet - low sodium heart healthy  As directed    Discharge instructions  As directed    Comments:     Please follow up with pulmonologist in 3-4 weeks   Increase activity slowly  As directed        Medication List    STOP taking these medications       furosemide 10 MG/ML solution  Commonly known as:  LASIX  Replaced by:  furosemide 40 MG tablet     levofloxacin 750 MG tablet  Commonly known as:  LEVAQUIN      TAKE these medications       amLODipine 5 MG tablet  Commonly known as:  NORVASC  5 mg by Gastric Tube route daily.  aspirin 81 MG tablet  81 mg by Gastric Tube route daily.     atorvastatin 20 MG tablet  Commonly known as:  LIPITOR  20 mg by Gastric Tube route every evening. 5pm     budesonide 0.25 MG/2ML nebulizer solution  Commonly known as:  PULMICORT  0.25 mg by Tracheal Tube route every 6 (six) hours.     famotidine 40 MG/5ML suspension  Commonly known as:  PEPCID  20 mg by Gastric Tube route at bedtime.     feeding supplement (OSMOLITE 1.2 CAL) Liqd  Place 1,000  mLs into feeding tube continuous. 85 ml/hr 12 noon to 8am (allow 4 hours downtime for therapy)     ferrous sulfate 325 (65 FE) MG tablet  325 mg by Gastric Tube route daily.     furosemide 40 MG tablet  Commonly known as:  LASIX  Take 1 tablet (40 mg total) by mouth daily.     insulin glargine 100 UNIT/ML injection  Commonly known as:  LANTUS  Inject 0.05 mLs (5 Units total) into the skin daily.     potassium chloride 20 MEQ/15ML (10%) solution  20 mEq by Gastric Tube route 3 (three) times daily. 9am, 2pm, 9pm       Allergies  Allergen Reactions  . Penicillins Swelling       Follow-up Information   Follow up with Kristi Hey, MD In 1 week.   Specialty:  Family Medicine   Contact information:   [place] [address] [phone]       Follow up with [facility] In 4 weeks.   Specialty:  Pulmonology   Contact information:   [place] [address] [phone]       The results of significant diagnostics from this hospitalization (including imaging, microbiology, ancillary and laboratory) are listed below for reference.    Significant Diagnostic Studies: Dg Chest 2 View  05/14/2013   CLINICAL DATA:  Altered mental status. Hypertension. Acute on chronic respiratory failure.  EXAM: CHEST  2 VIEW  COMPARISON:  04/22/2013  FINDINGS: Tracheostomy tube is seen in place. Mild cardiomegaly is stable. Mild diffuse interstitial prominence is stable. Decreased infiltrate or atelectasis seen at the lung bases since prior study. No new or worsening of pulmonary infiltrate seen. No evidence of pleural effusion. Ectasia of the thoracic aorta is stable.  IMPRESSION: Stable cardiomegaly and diffuse interstitial prominence. Resolving bibasilar atelectasis or infiltrates.   Electronically Signed   By: Kristi Alexander M.D.   On: 05/14/2013 21:14   Dg Chest Port 1 View  04/22/2013   CLINICAL DATA:  Intubated  EXAM: PORTABLE CHEST - 1 VIEW   COMPARISON:  04/21/2013  FINDINGS: Tracheostomy remains in unchanged position. Central venous catheter tip in the SVC also unchanged.  Mild bibasilar airspace consolidation is unchanged.  Cardiac enlargement without heart failure or edema.  IMPRESSION: Bibasilar atelectasis/ pneumonia, unchanged.   Electronically Signed   By: Franchot Gallo M.D.   On: 04/22/2013 07:35   Dg Chest Port 1 View  04/21/2013   CLINICAL DATA:  Tracheostomy placement.  EXAM: PORTABLE CHEST - 1 VIEW  COMPARISON:  DG CHEST 1V PORT dated 04/21/2013; DG CHEST 1V PORT dated 04/18/2013; DG CHEST 1V PORT dated 03/31/2013; DG CHEST 1V PORT dated 03/23/2013  FINDINGS: Tracheostomy tube noted with tip projected over the mid trachea. PICC line noted with tip at cavoatrial junction. Diffuse interstitial prominence noted throughout the lungs. Active interstitial lung disease cannot be  excluded. Mild left base atelectasis and/or alveolar infiltrate noted. Cardiomegaly. Component of congestive heart failure cannot be excluded, no pulmonary venous congestion however is noted. No significant pleural effusion.  IMPRESSION: 1. Tracheostomy tube noted in good anatomic position. PICC line in stable position with tip at cavoatrial junction. 2. Stable diffuse severe interstitial prominence remains. Left lower lobe mild atelectatic changes versus alveolar infiltrate remains.   Electronically Signed   By: Marcello Moores  Register   On: 04/21/2013 09:15   Dg Chest Port 1 View  04/21/2013   CLINICAL DATA:  Removal of tracheostomy.  Basilar crackles.  EXAM: PORTABLE CHEST - 1 VIEW  COMPARISON:  DG CHEST 1V PORT dated 04/18/2013; DG CHEST 1V PORT dated 03/23/2013; DG CHEST 1V PORT dated 11/15/2009; DG CHEST 1V PORT dated 03/26/2013  FINDINGS: Interval removal of tracheostomy. Right-sided PICC line terminates at the mid to low as cc.  Moderate cardiomegaly with atherosclerosis in the transverse aorta. No pleural effusion or pneumothorax. Moderate diffuse interstitial thickening.  Improved right base airspace disease. Patchy left base airspace disease is not significantly changed. New since 11/15/2009.  IMPRESSION: Similar patchy left base airspace disease, suspicious for infection or aspiration.  Improved right base aeration.  Cardiomegaly with chronic interstitial thickening.   Electronically Signed   By: Abigail Miyamoto M.D.   On: 04/21/2013 07:18    Microbiology: Recent Results (from the past 240 hour(s))  MRSA PCR SCREENING     Status: None   Collection Time    05/14/13 10:49 PM      Result Value Ref Range Status   MRSA by PCR NEGATIVE  NEGATIVE Final   Comment:            The GeneXpert MRSA Assay (FDA     approved for NASAL specimens     only), is one component of a     comprehensive MRSA colonization     surveillance program. It is not     intended to diagnose MRSA     infection nor to guide or     monitor treatment for     MRSA infections.     Labs: Basic Metabolic Panel:  Recent Labs Lab 05/16/13 0649 05/16/13 1545 05/17/13 0257 05/17/13 1430 05/18/13 0520  NA 155* 154* 151* 145 145  K 4.1 3.8 4.0 4.0 3.8  CL 116* 114* 113* 109 111  CO2 25 28 25 24 24   GLUCOSE 215* 148* 169* 145* 162*  BUN 77* 70* 54* 43* 28*  CREATININE 2.31* 1.89* 1.40* 1.18* 0.98  CALCIUM 8.5 8.5 8.4 8.6 8.4   Liver Function Tests:  Recent Labs Lab 05/14/13 1918  AST 20  ALT 24  ALKPHOS 220*  BILITOT 0.5  PROT 8.0  ALBUMIN 3.3*   No results found for this basename: LIPASE, AMYLASE,  in the last 168 hours No results found for this basename: AMMONIA,  in the last 168 hours CBC:  Recent Labs Lab 05/14/13 1918 05/15/13 0826 05/16/13 1545 05/17/13 0257  WBC 15.1* 20.9* 14.1* 10.5  HGB 11.8* 11.6* 8.7* 8.9*  HCT 41.8 38.9 28.8* 28.6*  MCV 98.8 91.5 89.4 88.5  PLT 354 273 198 166   Cardiac Enzymes: No results found for this basename: CKTOTAL, CKMB, CKMBINDEX, TROPONINI,  in the last 168 hours BNP: BNP (last 3 results)  Recent Labs  04/21/13 0701  04/21/13 1015 05/18/13 0520  PROBNP 8813.0* 10944.0* 1083.0*   CBG:  Recent Labs Lab 05/18/13 1153 05/18/13 1656 05/18/13 2002 05/18/13 2346 05/19/13 [place]  174* 124* 147* 137* 161*       Signed:  Aubryana Vittorio N  [facility] 05/19/2013, 11:27 AM

## 2013-05-19 NOTE — Progress Notes (Signed)
Physical Therapy Treatment Patient Details Name: Kristi Alexander MRN: [id number] DOB: [birth year] Today's Date: 06/05/2013    History of Present Illness  Kristi Alexander is a 80 y.o. female residing in the [facility] for Rehab therapy after she was hospitalized from 03/22/2013 until 04/19/2013 for a High Grade SBO of which she underwent an Exploratory Laparotomy with Small Bowel Resection on 03/23/2013 and developed Respiratory Failure requiring Tracheostomy placement and PEG tube placement who was sent from the SNF due to worsening confusion and progressive lethargy over the last 3 days.   She was evaluated in the ED and was found to have a glucose level of 1457, and a sodium level of 155. And a BUN/Cr of 90/1.91.  She was admininistered IVFs and started on the [facility] and referred for medical admission    PT Comments    Pt making steady progress.  Follow Up Recommendations  SNF     Equipment Recommendations  None recommended by PT    Recommendations for Other Services       Precautions / Restrictions Precautions Precautions: Fall    Mobility  Bed Mobility               General bed mobility comments: pt up in recliner  Transfers Overall transfer level: Needs assistance Equipment used: Rolling walker (2 wheeled) Transfers: Sit to/from Stand Sit to Stand: Min guard         General transfer comment: Assist to bring hips up from low surface  Ambulation/Gait Ambulation/Gait assistance: Min guard Ambulation Distance (Feet): 150 Feet Assistive device: Rolling walker (2 wheeled) Gait Pattern/deviations: Step-through pattern;Decreased stride length     General Gait Details: Assist for slight unsteadiness.   Stairs            Wheelchair Mobility    Modified Rankin (Stroke Patients Only)       Balance           Standing balance support: No upper extremity supported Standing balance-Leahy Scale: Fair                      Cognition Arousal/Alertness: Awake/alert Behavior During Therapy: WFL for tasks assessed/performed Overall Cognitive Status: Within Functional Limits for tasks assessed                      Exercises      General Comments        Pertinent Vitals/Pain See flow sheet    Home Living                      Prior Function            PT Goals (current goals can now be found in the care plan section) Progress towards PT goals: Progressing toward goals    Frequency  Min 3X/week    PT Plan Current plan remains appropriate    End of Session   Activity Tolerance: Patient tolerated treatment well Patient left: in chair;with call bell/phone within reach     Time: 1228-1251 PT Time Calculation (min): 23 min  Charges:  $Gait Training: 23-37 mins                    G Codes:      Kristi Alexander 2013/06/05, 1:31 PM  [employer] PT [phone]

## 2013-05-19 NOTE — Progress Notes (Signed)
Report called to [facility].

## 2013-05-24 ENCOUNTER — Encounter (INDEPENDENT_AMBULATORY_CARE_PROVIDER_SITE_OTHER): Payer: Medicare Other | Admitting: General Surgery

## 2013-06-02 ENCOUNTER — Encounter (INDEPENDENT_AMBULATORY_CARE_PROVIDER_SITE_OTHER): Payer: Self-pay | Admitting: General Surgery

## 2013-07-05 ENCOUNTER — Ambulatory Visit (INDEPENDENT_AMBULATORY_CARE_PROVIDER_SITE_OTHER): Payer: Medicare Other | Admitting: General Surgery

## 2013-07-05 ENCOUNTER — Encounter (INDEPENDENT_AMBULATORY_CARE_PROVIDER_SITE_OTHER): Payer: Self-pay | Admitting: General Surgery

## 2013-07-05 DIAGNOSIS — Z09 Encounter for follow-up examination after completed treatment for conditions other than malignant neoplasm: Secondary | ICD-10-CM | POA: Insufficient documentation

## 2013-07-05 NOTE — Progress Notes (Signed)
Subjective:     Patient ID: Kristi Alexander, female   DOB: 01-03-1926, 80 y.o.   MRN: [id number]  HPI Long course, but doing fine now.  No demential for me to discern   Review of Systems Doing well.    Objective:   Physical Exam Cuffless #4 tracheostomy tube removed. PEG removed.  No problems. Wound site is okay.    Assessment:     Doing well from bowel resection and PEG, sepsis     Plan:     RTC 6/2 for follow up. Instructions given for trach site and peg site care.

## 2013-07-19 ENCOUNTER — Ambulatory Visit (INDEPENDENT_AMBULATORY_CARE_PROVIDER_SITE_OTHER): Payer: Medicare Other | Admitting: Nurse Practitioner

## 2013-07-19 ENCOUNTER — Encounter: Payer: Self-pay | Admitting: Nurse Practitioner

## 2013-07-19 VITALS — BP 171/72 | HR 61 | Ht 60.0 in | Wt 130.0 lb

## 2013-07-19 DIAGNOSIS — R209 Unspecified disturbances of skin sensation: Secondary | ICD-10-CM

## 2013-07-19 DIAGNOSIS — G2581 Restless legs syndrome: Secondary | ICD-10-CM

## 2013-07-19 MED ORDER — PRAMIPEXOLE DIHYDROCHLORIDE 0.5 MG PO TABS: 0.5000 mg | ORAL_TABLET | Freq: Every day | ORAL | Status: DC

## 2013-07-19 NOTE — Progress Notes (Signed)
I have read the note, and I agree with the clinical assessment and plan.  Fate Caster K Annett Boxwell   

## 2013-07-19 NOTE — Patient Instructions (Signed)
Continue Mirapex daily after supper Will refill 3 months with one refill Followup in 6 months

## 2013-07-19 NOTE — Progress Notes (Signed)
[facility]  PATIENT: Kristi Alexander DOB: 17-Apr-1925   REASON FOR VISIT: follow up for restless legs    HISTORY OF PRESENT ILLNESS: [name], 80 year old female returns her followup. She was last seen in our office on November  26th 2014 for restless legs. Her restless leg syndrome is well controlled on Mirapex low dose. She has not had any side effects such as compulsive behaviors on the medication. She denies any falls or balance issues. She had in addition to the hospital since last seen for acute  respiratory failure,was in hospital or rehab for 2.5 months. She also had renal failure and bowel resection, and hyperosmolar nonketonic coma. She is now back living in her own home and says "its a miracle I am living".  She returns for reevaluation.    She has a history of restless leg syndrome is well controlled on Mirapex low-dose. She denies any compulsive behaviors, no falls, no balance issues. She also complains of swelling in her lower extremities when she has been on her feet all day long. She needs refills on her medication  Nerve conduction studies did not show evidence of a peripheral neuropathy. She is on low-dose Mirapex at night which has improved her symptoms but not completely gone. She denies any falls, any balance issues. She occasionally has shock like pains in the left foot and a crawling sensation. These sensations have improved with Mirapex as well. She takes care of a sick husband 24/7. She denies any compulsive behaviors with Mirapex or other side effects. No neurologic complaints     REVIEW OF SYSTEMS: Full 14 system review of systems performed and notable only for those listed, all others are neg:  Constitutional: N/A  Cardiovascular: N/A  Ear/Nose/Throat: N/A  Skin: N/A  Eyes: N/A  Respiratory: N/A  Gastroitestinal: N/A  Hematology/Lymphatic: N/A  Endocrine: N/A Musculoskeletal:N/A  Allergy/Immunology: N/A  Neurological: N/A Psychiatric:  N/A Sleep : Restless legs    ALLERGIES: Allergies  Allergen Reactions  . Penicillins Swelling    HOME MEDICATIONS: Outpatient Prescriptions Prior to Visit  Medication Sig Dispense Refill  . amLODipine (NORVASC) 5 MG tablet 5 mg by Gastric Tube route daily.      Marland Kitchen aspirin 81 MG tablet 81 mg by Gastric Tube route daily.       Marland Kitchen atorvastatin (LIPITOR) 20 MG tablet 20 mg by Gastric Tube route every evening. 5pm      . ferrous sulfate 325 (65 FE) MG tablet 325 mg by Gastric Tube route daily.       . pramipexole (MIRAPEX) 0.5 MG tablet Take 0.5 mg by mouth 3 (three) times daily.      . verapamil (CALAN) 40 MG tablet Take 40 mg by mouth 3 (three) times daily.      . budesonide (PULMICORT) 0.25 MG/2ML nebulizer solution 0.25 mg by Tracheal Tube route every 6 (six) hours.      . famotidine (PEPCID) 40 MG/5ML suspension 20 mg by Gastric Tube route at bedtime.      . furosemide (LASIX) 40 MG tablet Take 1 tablet (40 mg total) by mouth daily.  3 tablet  0  . insulin glargine (LANTUS) 100 UNIT/ML injection Inject 0.05 mLs (5 Units total) into the skin daily.  10 mL  11  . Nutritional Supplements (FEEDING SUPPLEMENT, OSMOLITE 1.2 CAL,) LIQD Place 1,000 mLs into feeding tube continuous. 85 ml/hr 12 noon to 8am (allow 4 hours downtime for therapy)      . potassium chloride  20 MEQ/15ML (10%) solution 20 mEq by Gastric Tube route 3 (three) times daily. 9am, 2pm, 9pm       No facility-administered medications prior to visit.    PAST MEDICAL HISTORY: Past Medical History  Diagnosis Date  . Restless leg syndrome   . Hypertension   . Dyslipidemia   . History of appendectomy   . H/O: hysterectomy   . Cyst of breast, right, benign solitary   . Inguinal hernia   . History of lumbosacral spine surgery   . Arthritis   . HHNC (hyperglycemic hyperosmolar nonketotic coma) 05/14/2013    PAST SURGICAL HISTORY: Past Surgical History  Procedure Laterality Date  . Appendectomy    . Abdominal hysterectomy     . Laparotomy N/A 03/23/2013    Procedure: EXPLORATORY LAPAROTOMY;  Surgeon: Gwenyth Ober, MD;  Location: [name];  Service: General;  Laterality: N/A;  . Bowel resection N/A 03/23/2013    Procedure: SMALL BOWEL RESECTION;  Surgeon: Gwenyth Ober, MD;  Location: [place];  Service: General;  Laterality: N/A;  . Tracheostomy      feinstein  . Esophagogastroduodenoscopy N/A 04/01/2013    Procedure: ESOPHAGOGASTRODUODENOSCOPY (EGD);  Surgeon: Gwenyth Ober, MD;  Location: [place];  Service: General;  Laterality: N/A;  . Peg placement N/A 04/01/2013    Procedure: PERCUTANEOUS ENDOSCOPIC GASTROSTOMY (PEG) PLACEMENT;  Surgeon: Gwenyth Ober, MD;  Location[facility] ENDOSCOPY;  Service: General;  Laterality: N/A;    FAMILY HISTORY: Family History  Problem Relation Age of Onset  . Heart attack Father   . Lung cancer Brother   . Healthy Sister     SOCIAL HISTORY: History   Social History  . Marital Status: Married    Spouse Name: Vicente Alexander     Number of Children: 2  . Years of Education: 12+   Occupational History  . Not on file.   Social History Main Topics  . Smoking status: Former Smoker -- 0.00 packs/day    Types: Cigarettes    Quit date: 02/25/2003  . Smokeless tobacco: Never Used  . Alcohol Use: Yes     Comment: occ  . Drug Use: No  . Sexual Activity: Not on file   Other Topics Concern  . Not on file   Social History Narrative   Patient is married and lives with her husband. The patient is retired. Patient has 2 children and has a college education.      PHYSICAL EXAM  Filed Vitals:   07/19/13 1437  BP: 171/72  Pulse: 61  Height: 5' (1.524 m)  Weight: 130 lb (58.968 kg)   Body mass index is 25.39 kg/(m^2).  Generalized: Well developed, in no acute distress  Musculoskeletal: No deformity   Neurological examination   Mentation: Alert oriented to time, place, history taking. Follows all commands speech and language fluent  Cranial nerve II-XII: Pupils were equal  round reactive to light extraocular movements were full, visual field were full on confrontational test. Facial sensation and strength were normal. hearing was intact to finger rubbing bilaterally. Uvula tongue midline. head turning and shoulder shrug were normal and symmetric.Tongue protrusion into cheek strength was normal. Motor: normal bulk and tone, full strength in the BUE, BLE, fine finger movements normal, no pronator drift. No focal weakness Sensory: normal and symmetric to light touch, pinprick, and  vibration  Coordination: finger-nose-finger, heel-to-shin bilaterally, no dysmetria Reflexes:  1+ upper lower and symmetric , plantar responses were flexor bilaterally. Gait and Station: Rising up from seated  position without assistance, normal stance,  moderate stride, good arm swing, smooth turning, able to perform tiptoe, and heel walking without difficulty. Tandem gait is steady. Ambulates with a quad cane   DIAGNOSTIC DATA (LABS, IMAGING, TESTING) - I reviewed patient records, labs, notes, testing and imaging myself where available.  Lab Results  Component Value Date   WBC 10.5 05/17/2013   HGB 8.9* 05/17/2013   HCT 28.6* 05/17/2013   MCV 88.5 05/17/2013   PLT 166 05/17/2013      Component Value Date/Time   NA 145 05/18/2013 0520   K 3.8 05/18/2013 0520   CL 111 05/18/2013 0520   CO2 24 05/18/2013 0520   GLUCOSE 162* 05/18/2013 0520   BUN 28* 05/18/2013 0520   CREATININE 0.98 05/18/2013 0520   CALCIUM 8.4 05/18/2013 0520   PROT 8.0 05/14/2013 1918   ALBUMIN 3.3* 05/14/2013 1918   AST 20 05/14/2013 1918   ALT 24 05/14/2013 1918   ALKPHOS 220* 05/14/2013 1918   BILITOT 0.5 05/14/2013 1918   GFRNONAA 51* 05/18/2013 0520   GFRAA 59* 05/18/2013 0520   Lab Results  Component Value Date   TRIG 99 03/27/2013   Lab Results  Component Value Date   HGBA1C 8.9* 05/15/2013    ASSESSMENT AND PLAN  80 y.o. year old female  has a past medical history of Restless leg syndrome; Hypertension;  Dyslipidemia;  Arthritis; and HHNC (hyperglycemic hyperosmolar nonketotic coma) (05/14/2013). here  to followup for restless legs which are well controlled on Mirapex .Reviewed hospital record.   Continue Mirapex daily after supper Will refill 3 months with one refill Followup in 6 months Dennie Bible, [facility], [facility], APRN  [facility] [address], [place], [place] [address] [phone]

## 2013-07-26 ENCOUNTER — Ambulatory Visit (INDEPENDENT_AMBULATORY_CARE_PROVIDER_SITE_OTHER): Payer: Medicare Other | Admitting: General Surgery

## 2013-07-26 VITALS — BP 126/82 | HR 77 | Temp 98.0°F | Resp 18 | Ht 64.0 in | Wt 130.0 lb

## 2013-07-26 DIAGNOSIS — Z09 Encounter for follow-up examination after completed treatment for conditions other than malignant neoplasm: Secondary | ICD-10-CM

## 2013-07-26 NOTE — Progress Notes (Signed)
Subjective:     Patient ID: Kristi Alexander, female   DOB: 1925-03-24, 80 y.o.   MRN: [id number]  HPI The patient is doing extremely well. She complains of some sensation in her throat that she cannot cough up completely but she's not coughing up any blood and she is breathing well.  Review of Systems Noncontributory.    Objective:   Physical Exam Both her tracheostomy site and her G-tube site have completely healed over and have no leakage.    Assessment:     Doing well status post bowel resection for strangulating obstruction followed by sepsis and renal failure.     Plan:      Both tracheostomy site and G-tube sites have healed well.Return to me on an as-needed basis.

## 2013-08-02 ENCOUNTER — Encounter (INDEPENDENT_AMBULATORY_CARE_PROVIDER_SITE_OTHER): Payer: Medicare Other | Admitting: General Surgery

## 2014-01-11 ENCOUNTER — Encounter: Payer: Self-pay | Admitting: Neurology

## 2014-01-17 ENCOUNTER — Encounter: Payer: Self-pay | Admitting: Neurology

## 2014-01-26 ENCOUNTER — Ambulatory Visit: Payer: Medicare Other | Admitting: Neurology

## 2014-02-02 ENCOUNTER — Other Ambulatory Visit: Payer: Self-pay | Admitting: Nurse Practitioner

## 2014-06-01 ENCOUNTER — Emergency Department (HOSPITAL_COMMUNITY): Payer: Medicare Other

## 2014-06-01 ENCOUNTER — Inpatient Hospital Stay (HOSPITAL_COMMUNITY)
Admission: EM | Admit: 2014-06-01 | Discharge: 2014-06-04 | DRG: 812 | Disposition: A | Discharge status: Home / Self Care | Payer: Medicare Other | Attending: Internal Medicine | Admitting: Internal Medicine

## 2014-06-01 ENCOUNTER — Encounter (HOSPITAL_COMMUNITY): Payer: Self-pay | Admitting: General Practice

## 2014-06-01 DIAGNOSIS — I959 Hypotension, unspecified: Secondary | ICD-10-CM | POA: Diagnosis present

## 2014-06-01 DIAGNOSIS — I272 Other secondary pulmonary hypertension: Secondary | ICD-10-CM | POA: Diagnosis present

## 2014-06-01 DIAGNOSIS — E785 Hyperlipidemia, unspecified: Secondary | ICD-10-CM | POA: Diagnosis present

## 2014-06-01 DIAGNOSIS — D649 Anemia, unspecified: Secondary | ICD-10-CM | POA: Diagnosis not present

## 2014-06-01 DIAGNOSIS — Z6823 Body mass index (BMI) 23.0-23.9, adult: Secondary | ICD-10-CM

## 2014-06-01 DIAGNOSIS — Z7982 Long term (current) use of aspirin: Secondary | ICD-10-CM

## 2014-06-01 DIAGNOSIS — I2510 Atherosclerotic heart disease of native coronary artery without angina pectoris: Secondary | ICD-10-CM | POA: Diagnosis present

## 2014-06-01 DIAGNOSIS — E780 Pure hypercholesterolemia: Secondary | ICD-10-CM | POA: Diagnosis present

## 2014-06-01 DIAGNOSIS — Z87891 Personal history of nicotine dependence: Secondary | ICD-10-CM

## 2014-06-01 DIAGNOSIS — R053 Chronic cough: Secondary | ICD-10-CM | POA: Diagnosis present

## 2014-06-01 DIAGNOSIS — N189 Chronic kidney disease, unspecified: Secondary | ICD-10-CM | POA: Diagnosis present

## 2014-06-01 DIAGNOSIS — K219 Gastro-esophageal reflux disease without esophagitis: Secondary | ICD-10-CM | POA: Diagnosis present

## 2014-06-01 DIAGNOSIS — K909 Intestinal malabsorption, unspecified: Secondary | ICD-10-CM | POA: Diagnosis present

## 2014-06-01 DIAGNOSIS — E861 Hypovolemia: Secondary | ICD-10-CM | POA: Diagnosis present

## 2014-06-01 DIAGNOSIS — R05 Cough: Secondary | ICD-10-CM | POA: Diagnosis present

## 2014-06-01 DIAGNOSIS — I129 Hypertensive chronic kidney disease with stage 1 through stage 4 chronic kidney disease, or unspecified chronic kidney disease: Secondary | ICD-10-CM | POA: Diagnosis present

## 2014-06-01 DIAGNOSIS — G2581 Restless legs syndrome: Secondary | ICD-10-CM | POA: Diagnosis present

## 2014-06-01 DIAGNOSIS — E440 Moderate protein-calorie malnutrition: Secondary | ICD-10-CM | POA: Diagnosis present

## 2014-06-01 DIAGNOSIS — F508 Other eating disorders: Secondary | ICD-10-CM | POA: Diagnosis present

## 2014-06-01 DIAGNOSIS — R55 Syncope and collapse: Secondary | ICD-10-CM | POA: Diagnosis not present

## 2014-06-01 DIAGNOSIS — I5040 Unspecified combined systolic (congestive) and diastolic (congestive) heart failure: Secondary | ICD-10-CM | POA: Diagnosis present

## 2014-06-01 DIAGNOSIS — E860 Dehydration: Secondary | ICD-10-CM | POA: Diagnosis present

## 2014-06-01 DIAGNOSIS — M1990 Unspecified osteoarthritis, unspecified site: Secondary | ICD-10-CM | POA: Diagnosis present

## 2014-06-01 DIAGNOSIS — R531 Weakness: Secondary | ICD-10-CM | POA: Diagnosis present

## 2014-06-01 DIAGNOSIS — Z79899 Other long term (current) drug therapy: Secondary | ICD-10-CM

## 2014-06-01 DIAGNOSIS — I5032 Chronic diastolic (congestive) heart failure: Secondary | ICD-10-CM | POA: Diagnosis present

## 2014-06-01 DIAGNOSIS — R001 Bradycardia, unspecified: Secondary | ICD-10-CM

## 2014-06-01 DIAGNOSIS — N179 Acute kidney failure, unspecified: Secondary | ICD-10-CM | POA: Diagnosis not present

## 2014-06-01 DIAGNOSIS — K529 Noninfective gastroenteritis and colitis, unspecified: Secondary | ICD-10-CM | POA: Diagnosis present

## 2014-06-01 LAB — SAVE SMEAR

## 2014-06-01 LAB — I-STAT CHEM 8, ED
BUN: 15 mg/dL (ref 6–23)
CALCIUM ION: 1.22 mmol/L (ref 1.13–1.30)
CHLORIDE: 109 mmol/L (ref 96–112)
Creatinine, Ser: 1.3 mg/dL — ABNORMAL HIGH (ref 0.50–1.10)
Glucose, Bld: 113 mg/dL — ABNORMAL HIGH (ref 70–99)
HEMATOCRIT: 19 % — AB (ref 36.0–46.0)
Hemoglobin: 6.5 g/dL — CL (ref 12.0–15.0)
Potassium: 4.2 mmol/L (ref 3.5–5.1)
Sodium: 142 mmol/L (ref 135–145)
TCO2: 19 mmol/L (ref 0–100)

## 2014-06-01 LAB — TROPONIN I: Troponin I: <0.03 ng/mL (ref <0.031)

## 2014-06-01 LAB — CBC WITH DIFFERENTIAL/PLATELET
BASOS ABS: 0 10*3/uL (ref 0.0–0.1)
Basophils Relative: 0 % (ref 0–1)
EOS ABS: 0.1 10*3/uL (ref 0.0–0.7)
Eosinophils Relative: 2 % (ref 0–5)
HEMATOCRIT: 18.3 % — AB (ref 36.0–46.0)
Hemoglobin: 5.3 g/dL — CL (ref 12.0–15.0)
LYMPHS ABS: 1.2 10*3/uL (ref 0.7–4.0)
Lymphocytes Relative: 21 % (ref 12–46)
MCH: 20.2 pg — AB (ref 26.0–34.0)
MCHC: 29 g/dL — ABNORMAL LOW (ref 30.0–36.0)
MCV: 69.8 fL — ABNORMAL LOW (ref 78.0–100.0)
MONO ABS: 0.7 10*3/uL (ref 0.1–1.0)
MONOS PCT: 12 % (ref 3–12)
NEUTROS ABS: 3.8 10*3/uL (ref 1.7–7.7)
Neutrophils Relative %: 65 % (ref 43–77)
PLATELETS: 210 10*3/uL (ref 150–400)
RBC: 2.62 MIL/uL — ABNORMAL LOW (ref 3.87–5.11)
RDW: 20.4 % — AB (ref 11.5–15.5)
WBC: 5.8 10*3/uL (ref 4.0–10.5)

## 2014-06-01 LAB — PREPARE RBC (CROSSMATCH)

## 2014-06-01 LAB — BRAIN NATRIURETIC PEPTIDE: B Natriuretic Peptide: 1120.8 pg/mL — ABNORMAL HIGH (ref 0.0–100.0)

## 2014-06-01 LAB — BASIC METABOLIC PANEL
Anion gap: 5 (ref 5–15)
BUN: 14 mg/dL (ref 6–23)
CALCIUM: 8.5 mg/dL (ref 8.4–10.5)
CO2: 24 mmol/L (ref 19–32)
CREATININE: 1.29 mg/dL — AB (ref 0.50–1.10)
Chloride: 109 mmol/L (ref 96–112)
GFR calc Af Amer: 42 mL/min — ABNORMAL LOW (ref >90)
GFR, EST NON AFRICAN AMERICAN: 36 mL/min — AB (ref >90)
Glucose, Bld: 116 mg/dL — ABNORMAL HIGH (ref 70–99)
POTASSIUM: 4.1 mmol/L (ref 3.5–5.1)
Sodium: 138 mmol/L (ref 135–145)

## 2014-06-01 LAB — POC OCCULT BLOOD, ED: Fecal Occult Bld: NEGATIVE

## 2014-06-01 LAB — MRSA PCR SCREENING: MRSA BY PCR: NEGATIVE

## 2014-06-01 MED ORDER — ONDANSETRON HCL 4 MG/2ML IJ SOLN: 4.0000 mg | Freq: Four times a day (QID) | INTRAMUSCULAR | Status: DC | PRN

## 2014-06-01 MED ORDER — ALUM & MAG HYDROXIDE-SIMETH 200-200-20 MG/5ML PO SUSP: 30.0000 mL | Freq: Four times a day (QID) | ORAL | Status: DC | PRN

## 2014-06-01 MED ORDER — ATORVASTATIN CALCIUM 20 MG PO TABS
20.0000 mg | ORAL_TABLET | Freq: Every evening | ORAL | Status: DC
  Administered 2014-06-01 – 2014-06-03 (×3): 20 mg via ORAL
  Filled 2014-06-01 (×4): qty 1

## 2014-06-01 MED ORDER — LOSARTAN POTASSIUM 50 MG PO TABS
50.0000 mg | ORAL_TABLET | Freq: Every day | ORAL | Status: DC
  Administered 2014-06-01 – 2014-06-04 (×4): 50 mg via ORAL
  Filled 2014-06-01 (×4): qty 1

## 2014-06-01 MED ORDER — SODIUM CHLORIDE 0.9 % IJ SOLN
3.0000 mL | Freq: Two times a day (BID) | INTRAMUSCULAR | Status: DC
  Administered 2014-06-02: 11:00:00 via INTRAVENOUS
  Administered 2014-06-03 – 2014-06-04 (×4): 3 mL via INTRAVENOUS

## 2014-06-01 MED ORDER — SODIUM CHLORIDE 0.9 % IV SOLN
Freq: Once | INTRAVENOUS | Status: AC
  Administered 2014-06-01: 18:00:00 via INTRAVENOUS

## 2014-06-01 MED ORDER — ASPIRIN EC 81 MG PO TBEC
81.0000 mg | DELAYED_RELEASE_TABLET | Freq: Every day | ORAL | Status: DC
  Administered 2014-06-01 – 2014-06-04 (×4): 81 mg via ORAL
  Filled 2014-06-01 (×4): qty 1

## 2014-06-01 MED ORDER — METOPROLOL SUCCINATE ER 25 MG PO TB24
25.0000 mg | ORAL_TABLET | Freq: Every day | ORAL | Status: DC
  Administered 2014-06-01 – 2014-06-04 (×4): 25 mg via ORAL
  Filled 2014-06-01 (×4): qty 1

## 2014-06-01 MED ORDER — ACETAMINOPHEN 650 MG RE SUPP: 650.0000 mg | Freq: Four times a day (QID) | RECTAL | Status: DC | PRN

## 2014-06-01 MED ORDER — PRAMIPEXOLE DIHYDROCHLORIDE 0.25 MG PO TABS
0.5000 mg | ORAL_TABLET | Freq: Every day | ORAL | Status: DC
  Administered 2014-06-01 – 2014-06-04 (×4): 0.5 mg via ORAL
  Filled 2014-06-01 (×4): qty 2

## 2014-06-01 MED ORDER — SODIUM CHLORIDE 0.9 % IV SOLN
10.0000 mL/h | Freq: Once | INTRAVENOUS | Status: AC
  Administered 2014-06-01: 10 mL/h via INTRAVENOUS

## 2014-06-01 MED ORDER — ONDANSETRON HCL 4 MG PO TABS: 4.0000 mg | ORAL_TABLET | Freq: Four times a day (QID) | ORAL | Status: DC | PRN

## 2014-06-01 MED ORDER — ACETAMINOPHEN 325 MG PO TABS: 650.0000 mg | ORAL_TABLET | Freq: Four times a day (QID) | ORAL | Status: DC | PRN

## 2014-06-01 MED ORDER — SODIUM CHLORIDE 0.9 % IV BOLUS (SEPSIS)
500.0000 mL | Freq: Once | INTRAVENOUS | Status: AC
  Administered 2014-06-01: 500 mL via INTRAVENOUS

## 2014-06-01 NOTE — ED Provider Notes (Signed)
CSN: [id number]     Arrival date & time 06/01/14  1105 History   First MD Initiated Contact with Patient 06/01/14 1133     Chief Complaint  Patient presents with  . Fatigue     (Consider location/radiation/quality/duration/timing/severity/associated sxs/prior Treatment) HPI  This is an 81 year old female who presents with generalized weakness. History of hypertension, restless leg syndrome. Reports general fatigue, nausea, vomiting, and diarrhea. Worsening symptoms over the last several days. By EMS was found to have a heart rate of 64 and a blood pressure of 94/46. Patient reports that she last took her blood pressure medications last night. She also takes metoprolol and verapamil. Patient denies any fever, chest pain, shortness of breath, abdominal pain, urinary symptoms.  Past Medical History  Diagnosis Date  . Restless leg syndrome   . Hypertension   . Dyslipidemia   . History of appendectomy   . H/O: hysterectomy   . Cyst of breast, right, benign solitary   . Inguinal hernia   . History of lumbosacral spine surgery   . Arthritis   . HHNC (hyperglycemic hyperosmolar nonketotic coma) 05/14/2013   Past Surgical History  Procedure Laterality Date  . Appendectomy    . Abdominal hysterectomy    . Laparotomy N/A 03/23/2013    Procedure: EXPLORATORY LAPAROTOMY;  Surgeon: Gwenyth Ober, MD;  Location: [place];  Service: General;  Laterality: N/A;  . Bowel resection N/A 03/23/2013    Procedure: SMALL BOWEL RESECTION;  Surgeon: Gwenyth Ober, MD;  Location: [place];  Service: General;  Laterality: N/A;  . Tracheostomy      feinstein  . Esophagogastroduodenoscopy N/A 04/01/2013    Procedure: ESOPHAGOGASTRODUODENOSCOPY (EGD);  Surgeon: Gwenyth Ober, MD;  Location: [place];  Service: General;  Laterality: N/A;  . Peg placement N/A 04/01/2013    Procedure: PERCUTANEOUS ENDOSCOPIC GASTROSTOMY (PEG) PLACEMENT;  Surgeon: Gwenyth Ober, MD;  Location: [facility] ENDOSCOPY;  Service: General;  Laterality:  N/A;   Family History  Problem Relation Age of Onset  . Heart attack Father   . Lung cancer Brother   . Healthy Sister    History  Substance Use Topics  . Smoking status: Former Smoker -- 0.00 packs/day    Types: Cigarettes    Quit date: 02/25/2003  . Smokeless tobacco: Never Used  . Alcohol Use: Yes     Comment: occ   OB History    No data available     Review of Systems  Constitutional: Positive for fatigue. Negative for fever.  Respiratory: Negative for cough, chest tightness and shortness of breath.   Cardiovascular: Negative for chest pain, palpitations and leg swelling.  Gastrointestinal: Positive for nausea, vomiting and diarrhea. Negative for abdominal pain.  Genitourinary: Negative for dysuria.  Musculoskeletal: Negative for back pain.  Skin: Negative for wound.  Neurological: Negative for headaches.  Psychiatric/Behavioral: Negative for confusion.  All other systems reviewed and are negative.     Allergies  Penicillins  Home Medications   Prior to Admission medications   Medication Sig Start Date End Date Taking? Authorizing Provider  amLODipine (NORVASC) 5 MG tablet 5 mg by Gastric Tube route daily.   Yes Historical Provider, MD  aspirin 81 MG tablet 81 mg by Gastric Tube route daily.    Yes Historical Provider, MD  atorvastatin (LIPITOR) 20 MG tablet 20 mg by Gastric Tube route every evening. 5pm 01/04/13  Yes Historical Provider, MD  ferrous sulfate 325 (65 FE) MG tablet 325 mg by Gastric Tube route daily.  Yes Historical Provider, MD  metoprolol succinate (TOPROL-XL) 100 MG 24 hr tablet Take 100 mg by mouth daily. Take with or immediately following a meal.   Yes Historical Provider, MD  pramipexole (MIRAPEX) 0.5 MG tablet Take 0.5 mg by mouth daily.   Yes Historical Provider, MD  verapamil (CALAN-SR) 240 MG CR tablet Take 240 mg by mouth at bedtime.   Yes Historical Provider, MD  pramipexole (MIRAPEX) 0.5 MG tablet TAKE ONE TABLET BY MOUTH ONCE  DAILY Patient not taking: Reported on 06/01/2014 02/02/14   Kathrynn Ducking, MD  verapamil (CALAN) 40 MG tablet Take 40 mg by mouth 3 (three) times daily.    Historical Provider, MD   BP 139/48 mmHg  Pulse 50  Temp(Src) 97.2 F (36.2 C) (Rectal)  Resp 16  Ht 5\' 1"  (1.549 m)  Wt 123 lb (55.792 kg)  BMI 23.25 kg/m2  SpO2 96% Physical Exam  Constitutional: She is oriented to person, place, and time.  Elderly, ill-appearing but nontoxic  HENT:  Head: Normocephalic and atraumatic.  Mucous membranes dry  Neck: Neck supple.  Cardiovascular: Normal heart sounds.   No murmur heard. Irregular rhythm, bradycardia  Pulmonary/Chest: Effort normal and breath sounds normal. No respiratory distress. She has no wheezes.  Abdominal: Soft. Bowel sounds are normal. There is no tenderness. There is no rebound and no guarding.  Musculoskeletal: She exhibits no edema.  Neurological: She is alert and oriented to person, place, and time.  Skin: Skin is warm and dry.  Psychiatric: She has a normal mood and affect.  Nursing note and vitals reviewed.   ED Course  Procedures (including critical care time)  CRITICAL CARE Performed by: Merryl Hacker   Total critical care time: 40 min  Critical care time was exclusive of separately billable procedures and treating other patients.  Critical care was necessary to treat or prevent imminent or life-threatening deterioration.  Critical care was time spent personally by me on the following activities: development of treatment plan with patient and/or surrogate as well as nursing, discussions with consultants, evaluation of patient's response to treatment, examination of patient, obtaining history from patient or surrogate, ordering and performing treatments and interventions, ordering and review of laboratory studies, ordering and review of radiographic studies, pulse oximetry and re-evaluation of patient's condition.  Labs Review Labs Reviewed  CBC  WITH DIFFERENTIAL/PLATELET - Abnormal; Notable for the following:    RBC 2.62 (*)    Hemoglobin 5.3 (*)    HCT 18.3 (*)    MCV 69.8 (*)    MCH 20.2 (*)    MCHC 29.0 (*)    RDW 20.4 (*)    All other components within normal limits  BASIC METABOLIC PANEL - Abnormal; Notable for the following:    Glucose, Bld 116 (*)    Creatinine, Ser 1.29 (*)    GFR calc non Af Amer 36 (*)    GFR calc Af Amer 42 (*)    All other components within normal limits  I-STAT CHEM 8, ED - Abnormal; Notable for the following:    Creatinine, Ser 1.30 (*)    Glucose, Bld 113 (*)    Hemoglobin 6.5 (*)    HCT 19.0 (*)    All other components within normal limits  TROPONIN I  POC OCCULT BLOOD, ED  TYPE AND SCREEN  PREPARE RBC (CROSSMATCH)    Imaging Review Dg Chest Portable 1 View  06/01/2014   CLINICAL DATA:  Weakness and nausea.  EXAM: PORTABLE CHEST -  1 VIEW  COMPARISON:  PA and lateral chest 05/14/2013.  FINDINGS: There is cardiomegaly and vascular congestion. No consolidative process, pneumothorax or effusion is seen. Defibrillator pad is in place.  IMPRESSION: Cardiomegaly without acute disease.   Electronically Signed   By: Inge Rise M.D.   On: 06/01/2014 13:03     EKG Interpretation   Date/Time:  Thursday June 01 2014 11:20:35 EDT Ventricular Rate:  42 PR Interval:    QRS Duration: 83 QT Interval:  533 QTC Calculation: 445 R Axis:   47 Text Interpretation:  Atrial fibrillation Borderline repol abnrm,  anterolateral leads Slow atrial fibrillation when compared to prior  Confirmed by HORTON  MD, [place] ([id number]) on 06/01/2014 11:48:22 AM      MDM   Final diagnoses:  Symptomatic anemia  Bradycardia   Patient presents with generalized fatigue. Reports recent history of vomiting and diarrhea. Noted to be bradycardic in the 50s and low blood pressure of 101/38. Has not taken her blood pressure medications today. Is on a beta blocker and calcium channel blocker. EKG shows slow A. fib with  a rate of 42. Patient placed on pacer pads.  No known history of atrial fibrillation. Fatigue could be secondary to bradycardia. Patient given fluids with good response in her blood pressure into the 130s. Workup notable for hemoglobin of 5.3. Baseline around 8.5. Creatinine is also elevated to 1.3 with a baseline around 0.9.  Felt patient's symptoms are most likely secondary to symptomatic anemia. Given how low her hemoglobin is and her symptoms, will transfuse 1 unit of packed red cells.  Hemoccult negative.  Appears to be microcytic anemia and could be chronic. Will need anemia workup. Will admit for further management.  Primary doctor is [name].    Merryl Hacker, MD 06/01/14 [phone]

## 2014-06-01 NOTE — H&P (Signed)
[facility] History and Physical                                                                                    Kristi Alexander, is a 81 y.o. female  MRN: [id number]   DOB - May 30, 1925  Admit Date - 06/01/2014  Outpatient Primary MD for the patient is [name], MD  With History of -  Past Medical History  Diagnosis Date  . Restless leg syndrome   . Hypertension   . Dyslipidemia   . History of appendectomy   . H/O: hysterectomy   . Cyst of breast, right, benign solitary   . Inguinal hernia   . History of lumbosacral spine surgery   . Arthritis   . HHNC (hyperglycemic hyperosmolar nonketotic coma) 05/14/2013      Past Surgical History  Procedure Laterality Date  . Appendectomy    . Abdominal hysterectomy    . Laparotomy N/A 03/23/2013    Procedure: EXPLORATORY LAPAROTOMY;  Surgeon: Gwenyth Ober, MD;  Location: [place];  Service: General;  Laterality: N/A;  . Bowel resection N/A 03/23/2013    Procedure: SMALL BOWEL RESECTION;  Surgeon: Gwenyth Ober, MD;  Location: [place];  Service: General;  Laterality: N/A;  . Tracheostomy      feinstein  . Esophagogastroduodenoscopy N/A 04/01/2013    Procedure: ESOPHAGOGASTRODUODENOSCOPY (EGD);  Surgeon: Gwenyth Ober, MD;  Location: [facility] ENDOSCOPY;  Service: General;  Laterality: N/A;  . Peg placement N/A 04/01/2013    Procedure: PERCUTANEOUS ENDOSCOPIC GASTROSTOMY (PEG) PLACEMENT;  Surgeon: Gwenyth Ober, MD;  Location: [facility] ENDOSCOPY;  Service: General;  Laterality: N/A;    in for   Chief Complaint  Patient presents with  . Fatigue     HPI  Kristi Alexander  is a 81 y.o. female, with a past medical history significant for anemia, hypertension, dyslipidemia, small bowel obstruction status post small bowel resection in 2015. Her 2015 hospitalization was complicated requiring both a PEG and a trach to be placed. She presents to the emergency department today with severe weakness and nausea and diarrhea. Ms. Garduno reports that since  her surgery she has lost 25 pounds over the last year. In February 2016 she developed intermittent diarrhea and nonproductive cough. She continues to have the symptoms today. At times her cough is so severe that she becomes short of breath and afraid. Recently she has  developed pica -  eating ice. She has done this in the past when her anemia has become severe. This morning   she was feeling weak and dizzy. She developed nausea and went to the bathroom prepared to vomit and have diarrhea. However she only had abdominal discomfort and severe nausea.   In the ER she is found to have a hemoglobin of 5.3, she is guaiac negative, she has a pulse rate of 39, and a creatinine of 1.3. Her baseline creatinine is 1.0. Her troponin is within normal limits. She will be admitted to stepdown. Her cardiologist, Dr. Terrence Dupont , has agreed to see her in consultation.    Review of Systems   In addition to the HPI above,  No Fever-chills, No Headache, No changes  with Vision or hearing, No problems swallowing food or Liquids, No Chest pain, She denies melena, hematochezia, hematemesis, hematuria  No dysuria, No new skin rashes or bruises, No new joints pains-aches,  No new weakness, tingling, numbness in any extremity, No recent weight gain or loss, A full 10 point Review of Systems was done, except as stated above, all other Review of Systems were negative.  Social History History  Substance Use Topics  . Smoking status: Former Smoker -- 0.00 packs/day    Types: Cigarettes    Quit date: 02/25/2003  . Smokeless tobacco: Never Used  . Alcohol Use: Yes     Comment: occ   she has a 10-pack-year smoking history.   Family History Family History  Problem Relation Age of Onset  . Heart attack Father   . Lung cancer Brother   . Healthy Sister     Prior to Admission medications   Medication Sig Start Date End Date Taking? Authorizing Provider  amLODipine (NORVASC) 5 MG tablet 5 mg by Gastric Tube route  daily.   Yes Historical Provider, MD  aspirin 81 MG tablet 81 mg by Gastric Tube route daily.    Yes Historical Provider, MD  atorvastatin (LIPITOR) 20 MG tablet 20 mg by Gastric Tube route every evening. 5pm 01/04/13  Yes Historical Provider, MD  ferrous sulfate 325 (65 FE) MG tablet 325 mg by Gastric Tube route daily.    Yes Historical Provider, MD  metoprolol succinate (TOPROL-XL) 100 MG 24 hr tablet Take 100 mg by mouth daily. Take with or immediately following a meal.   Yes Historical Provider, MD  pramipexole (MIRAPEX) 0.5 MG tablet Take 0.5 mg by mouth daily.   Yes Historical Provider, MD  verapamil (CALAN-SR) 240 MG CR tablet Take 240 mg by mouth at bedtime.   Yes Historical Provider, MD  pramipexole (MIRAPEX) 0.5 MG tablet TAKE ONE TABLET BY MOUTH ONCE DAILY Patient not taking: Reported on 06/01/2014 02/02/14   Kathrynn Ducking, MD  verapamil (CALAN) 40 MG tablet Take 40 mg by mouth 3 (three) times daily.    Historical Provider, MD    Allergies  Allergen Reactions  . Penicillins Swelling    Physical Exam  Vitals  Blood pressure 144/63, pulse 73, temperature 98 F (36.7 C), temperature source Oral, resp. rate 17, height 5\' 1"  (1.549 m), weight 55.792 kg (123 lb), SpO2 100 %.   General:  well-developed, but thin, very pleasant female  lying in bed in NAD,  daughter at bedside.  Her color is poor.  Psych:  Normal affect and insight, Not Suicidal or Homicidal, Awake Alert, Oriented X 3.  Neuro:   No F.N deficits, ALL C.Nerves Intact, Strength 5/5 all 4 extremities, Sensation intact all 4 extremities.  ENT:  Ears and Eyes appear Normal, Conjunctivae are very pale, PER. Moist oral mucosa without erythema or exudates.  Neck:  Supple, No lymphadenopathy appreciated  Respiratory:  Symmetrical chest wall movement, Good air movement bilaterally, CTAB.  Cardiac:  RRR, No Murmurs, no LE edema noted, no JVD.    Abdomen:  Positive bowel sounds, Soft, Non tender, Non distended,  No  masses appreciated.  Well healed scar.  Skin:  No Cyanosis, Poor Skin Turgor, No Skin Rash or Bruise.  Extremities:  Able to move all 4. 5/5 strength in each,  no effusions.  Compression stocking on RLE.  Data Review  CBC  Recent Labs Lab 06/01/14 1159 06/01/14 1226  WBC 5.8  --   HGB 5.3* 6.5*  HCT 18.3* 19.0*  PLT 210  --   MCV 69.8*  --   MCH 20.2*  --   MCHC 29.0*  --   RDW 20.4*  --   LYMPHSABS 1.2  --   MONOABS 0.7  --   EOSABS 0.1  --   BASOSABS 0.0  --     Chemistries   Recent Labs Lab 06/01/14 1159 06/01/14 1226  NA 138 142  K 4.1 4.2  CL 109 109  CO2 24  --   GLUCOSE 116* 113*  BUN 14 15  CREATININE 1.29* 1.30*  CALCIUM 8.5  --      Cardiac Enzymes  Recent Labs Lab 06/01/14 1159  TROPONINI <0.03    Imaging results:   Dg Chest Portable 1 View  06/01/2014   CLINICAL DATA:  Weakness and nausea.  EXAM: PORTABLE CHEST - 1 VIEW  COMPARISON:  PA and lateral chest 05/14/2013.  FINDINGS: There is cardiomegaly and vascular congestion. No consolidative process, pneumothorax or effusion is seen. Defibrillator pad is in place.  IMPRESSION: Cardiomegaly without acute disease.   Electronically Signed   By: Inge Rise M.D.   On: 06/01/2014 13:03    My personal review of EKG:  Afib, bradycardia.  HR of 39.   Assessment & Plan  Principal Problem:   Bradycardia Active Problems:   Restless legs syndrome (RLS)   Chronic diastolic heart failure   Symptomatic anemia   Acute on chronic renal failure   Bradycardia Suspect this is due to continued use of metoprolol and verapamil despite 25 pound weight loss. Patient has been admitted to stepdown. Patient is at bedside. Dr. Terrence Dupont has been consulted.  holding metoprolol and verapamil.  Symptomatically anemia Uncertain etiology. Patient is guaiac negative.  She has no frank ecchymosis or bleeding. Anemia panel pending. Save smear pending. 2 units of packed red blood cells have been ordered. Given  her small bowel resection she may have malabsorption issue causing her anemia  Acute renal failure Likely secondary to dehydration and poor perfusion. Will transfuse and check a.m. Bmet.  Restless leg Continue Mirapex.  Chronic diastolic heart failure Patient is currently hypovolemic and asymptomatic from her diastolic heart failure.  Chronic diarrhea Intermittent since February. GI pathogen panel pending. May need outpatient GI workup.  Chronic nonproductive cough Uncertain etiology. Patient does not have any signs of URI. Chest x-ray is clear. Doubt CHF symptoms. Not on an ACE inhibitor. Her last 2-D echo was in 2015. We'll defer to Dr. Terrence Dupont about whether or not to update that at this point.    disposition: PT/OT consultations requested.   DVT Prophylaxis: SCD  AM Labs Ordered, also please review Full Orders  Family Communication:   dtr at bedside.  Code Status:  full  Condition:  Guarded.  Time spent in minutes : [place],  PA-C on 06/01/2014 at 3:40 PM  Between 7am to 7pm - Pager - [phone]  After 7pm go to [email or web address] - password [email or web address]  And look for the night coverage person covering me after hours  [facility]

## 2014-06-01 NOTE — ED Notes (Signed)
Pt brought in via [facility] with complaints of weakness and nausea. Pt is A/\O. Pt denies any pain or vomiting. EMS V/S HR 64, BP 94/46, SPO2 96% on RA, and CBG 149.

## 2014-06-01 NOTE — Consult Note (Signed)
Reason for Consult: Marked sinus bradycardia Referring Physician: [facility]  Kristi Alexander is an 81 y.o. female.  HPI: Patient is 81 year old female with past medical history significant for hypertension, mild coronary artery disease, GERD, hypercholesterolemia, protein calorie malnutrition, chronic anemia, degenerative joint disease, history of small bowel resection in the past, history of restless leg syndrome, was admitted earlier today because of generalized weakness feeling tired and fatigued and no energy coughing and shortness of breath was noted to be hypotensive with marked sinus bradycardia with junctional escape rhythm. Patient also was noted to have marked hypochromic microcytic anemia with hemoglobin of 5.3. Patient received fluid challenges and one unit of packed RBC with improvement in her blood pressure and heart rate in 70s with sinus rhythm. Patient denies any syncopal episode. Denies any anginal chest pain. Denies palpitation lightheadedness. States overall feels better after blood transfusion. Patient was on 2 AV blocking medications i.e. beta blockers and calcium channel blockers.  Past Medical History  Diagnosis Date  . Restless leg syndrome   . Hypertension   . Dyslipidemia   . History of appendectomy   . H/O: hysterectomy   . Cyst of breast, right, benign solitary   . Inguinal hernia   . History of lumbosacral spine surgery   . Arthritis   . HHNC (hyperglycemic hyperosmolar nonketotic coma) 05/14/2013    Past Surgical History  Procedure Laterality Date  . Appendectomy    . Abdominal hysterectomy    . Laparotomy N/A 03/23/2013    Procedure: EXPLORATORY LAPAROTOMY;  Surgeon: Gwenyth Ober, MD;  Location: [place];  Service: General;  Laterality: N/A;  . Bowel resection N/A 03/23/2013    Procedure: SMALL BOWEL RESECTION;  Surgeon: Gwenyth Ober, MD;  Location: [place];  Service: General;  Laterality: N/A;  . Tracheostomy      feinstein  .  Esophagogastroduodenoscopy N/A 04/01/2013    Procedure: ESOPHAGOGASTRODUODENOSCOPY (EGD);  Surgeon: Gwenyth Ober, MD;  Location: [place];  Service: General;  Laterality: N/A;  . Peg placement N/A 04/01/2013    Procedure: PERCUTANEOUS ENDOSCOPIC GASTROSTOMY (PEG) PLACEMENT;  Surgeon: Gwenyth Ober, MD;  Location: [facility] ENDOSCOPY;  Service: General;  Laterality: N/A;    Family History  Problem Relation Age of Onset  . Heart attack Father   . Lung cancer Brother   . Healthy Sister     Social History:  reports that she quit smoking about 11 years ago. Her smoking use included Cigarettes. She smoked 0.00 packs per day. She has never used smokeless tobacco. She reports that she drinks alcohol. She reports that she does not use illicit drugs.  Allergies:  Allergies  Allergen Reactions  . Penicillins Swelling    Medications: I have reviewed the patient's current medications.  Results for orders placed or performed during the hospital encounter of 06/01/14 (from the past 48 hour(s))  CBC with Differential     Status: Abnormal   Collection Time: 06/01/14 11:59 AM  Result Value Ref Range   WBC 5.8 4.0 - 10.5 K/uL   RBC 2.62 (L) 3.87 - 5.11 MIL/uL   Hemoglobin 5.3 (LL) 12.0 - 15.0 g/dL    Comment: REPEATED TO VERIFY CRITICAL RESULT CALLED TO, READ BACK BY AND VERIFIED WITH: RICE,D RN @ 1243 06/01/14 LEONARD,A    HCT 18.3 (L) 36.0 - 46.0 %   MCV 69.8 (L) 78.0 - 100.0 fL   MCH 20.2 (L) 26.0 - 34.0 pg   MCHC 29.0 (L) 30.0 - 36.0 g/dL  RDW 20.4 (H) 11.5 - 15.5 %   Platelets 210 150 - 400 K/uL    Comment: PLATELET COUNT CONFIRMED BY SMEAR   Neutrophils Relative % 65 43 - 77 %   Lymphocytes Relative 21 12 - 46 %   Monocytes Relative 12 3 - 12 %   Eosinophils Relative 2 0 - 5 %   Basophils Relative 0 0 - 1 %   Neutro Abs 3.8 1.7 - 7.7 K/uL   Lymphs Abs 1.2 0.7 - 4.0 K/uL   Monocytes Absolute 0.7 0.1 - 1.0 K/uL   Eosinophils Absolute 0.1 0.0 - 0.7 K/uL   Basophils Absolute 0.0 0.0 - 0.1  K/uL   RBC Morphology POLYCHROMASIA PRESENT     Comment: TARGET CELLS SCHISTOCYTES PRESENT (2-9/BMW)   Basic metabolic panel     Status: Abnormal   Collection Time: 06/01/14 11:59 AM  Result Value Ref Range   Sodium 138 135 - 145 mmol/L   Potassium 4.1 3.5 - 5.1 mmol/L   Chloride 109 96 - 112 mmol/L   CO2 24 19 - 32 mmol/L   Glucose, Bld 116 (H) 70 - 99 mg/dL   BUN 14 6 - 23 mg/dL   Creatinine, Ser 1.29 (H) 0.50 - 1.10 mg/dL   Calcium 8.5 8.4 - 10.5 mg/dL   GFR calc non Af Amer 36 (L) >90 mL/min   GFR calc Af Amer 42 (L) >90 mL/min    Comment: (NOTE) The eGFR has been calculated using the CKD EPI equation. This calculation has not been validated in all clinical situations. eGFR's persistently <90 mL/min signify possible Chronic Kidney Disease.    Anion gap 5 5 - 15  Troponin I     Status: None   Collection Time: 06/01/14 11:59 AM  Result Value Ref Range   Troponin I <0.03 <0.031 ng/mL    Comment:        NO INDICATION OF MYOCARDIAL INJURY.   I-Stat Chem 8, ED     Status: Abnormal   Collection Time: 06/01/14 12:26 PM  Result Value Ref Range   Sodium 142 135 - 145 mmol/L   Potassium 4.2 3.5 - 5.1 mmol/L   Chloride 109 96 - 112 mmol/L   BUN 15 6 - 23 mg/dL   Creatinine, Ser 1.30 (H) 0.50 - 1.10 mg/dL   Glucose, Bld 113 (H) 70 - 99 mg/dL   Calcium, Ion 1.22 1.13 - 1.30 mmol/L   TCO2 19 0 - 100 mmol/L   Hemoglobin 6.5 (LL) 12.0 - 15.0 g/dL   HCT 19.0 (L) 36.0 - 46.0 %   Comment NOTIFIED PHYSICIAN   Type and screen     Status: None (Preliminary result)   Collection Time: 06/01/14 12:56 PM  Result Value Ref Range   ABO/RH(D) O POS    Antibody Screen NEG    Sample Expiration 06/04/2014    Unit Number [id number]    Blood Component Type RED CELLS,LR    Unit division 00    Status of Unit ISSUED    Transfusion Status OK TO TRANSFUSE    Crossmatch Result Compatible    Unit Number [id number]    Blood Component Type RED CELLS,LR    Unit division 00    Status of  Unit ALLOCATED    Transfusion Status OK TO TRANSFUSE    Crossmatch Result Compatible   Prepare RBC     Status: None   Collection Time: 06/01/14 12:58 PM  Result Value Ref Range   Order Confirmation ORDER PROCESSED  BY BLOOD BANK   POC occult blood, ED     Status: None   Collection Time: 06/01/14  1:07 PM  Result Value Ref Range   Fecal Occult Bld NEGATIVE NEGATIVE  Prepare RBC     Status: None   Collection Time: 06/01/14  4:14 PM  Result Value Ref Range   Order Confirmation ORDER PROCESSED BY BLOOD BANK     Dg Chest Portable 1 View  06/01/2014   CLINICAL DATA:  Weakness and nausea.  EXAM: PORTABLE CHEST - 1 VIEW  COMPARISON:  PA and lateral chest 05/14/2013.  FINDINGS: There is cardiomegaly and vascular congestion. No consolidative process, pneumothorax or effusion is seen. Defibrillator pad is in place.  IMPRESSION: Cardiomegaly without acute disease.   Electronically Signed   By: Inge Rise M.D.   On: 06/01/2014 13:03    Review of Systems  Constitutional: Positive for malaise/fatigue. Negative for fever and chills.  Eyes: Negative for double vision.  Respiratory: Positive for cough and shortness of breath.   Cardiovascular: Negative for chest pain, palpitations, claudication and leg swelling.  Gastrointestinal: Negative for nausea, vomiting and abdominal pain.  Genitourinary: Negative for dysuria.  Neurological: Positive for dizziness and weakness.   Blood pressure 186/62, pulse 82, temperature 98.5 F (36.9 C), temperature source Oral, resp. rate 25, height 5' 1" (1.549 m), weight 55.792 kg (123 lb), SpO2 100 %. Physical Exam  Constitutional: She is oriented to person, place, and time.  Eyes: No scleral icterus.  Conjunctiva pale  Neck: Normal range of motion. Neck supple. No JVD present. No tracheal deviation present. No thyromegaly present.  Cardiovascular: Normal rate and regular rhythm.   Murmur (Soft systolic murmur noted no S3 gallop) heard. Respiratory: Breath  sounds normal. No respiratory distress. She has no wheezes. She has no rales.  GI: Soft. Bowel sounds are normal. She exhibits no distension. There is no tenderness.  Musculoskeletal: She exhibits no edema or tenderness.  Neurological: She is alert and oriented to person, place, and time.    Assessment/Plan: Status post symptomatic marked sinus bradycardia with junctional escape rhythm secondary to medications Acute on chronic microcytic hypochromic anemia etiology unclear Hypertension GERD Hypercholesteremia Protein calorie malnutrition Degenerative joint disease History of small bowel resection in the past Plan Will DC calcium channel blocker We'll start beta blocker at lower dose as per orders Add angiotensin receptor blocker as per orders Anemia workup per primary team Check TSH , N 06/01/2014, 5:29 PM

## 2014-06-01 NOTE — ED Notes (Signed)
Nurse Dreama was informed of patient chem 8 results.

## 2014-06-02 DIAGNOSIS — K529 Noninfective gastroenteritis and colitis, unspecified: Secondary | ICD-10-CM

## 2014-06-02 DIAGNOSIS — R053 Chronic cough: Secondary | ICD-10-CM | POA: Diagnosis present

## 2014-06-02 DIAGNOSIS — N179 Acute kidney failure, unspecified: Secondary | ICD-10-CM | POA: Diagnosis present

## 2014-06-02 DIAGNOSIS — R05 Cough: Secondary | ICD-10-CM

## 2014-06-02 DIAGNOSIS — R001 Bradycardia, unspecified: Secondary | ICD-10-CM | POA: Diagnosis present

## 2014-06-02 DIAGNOSIS — G2581 Restless legs syndrome: Secondary | ICD-10-CM

## 2014-06-02 LAB — CBC
HEMATOCRIT: 21.5 % — AB (ref 36.0–46.0)
HEMATOCRIT: 30 % — AB (ref 36.0–46.0)
Hemoglobin: 6.6 g/dL — CL (ref 12.0–15.0)
Hemoglobin: 9.4 g/dL — ABNORMAL LOW (ref 12.0–15.0)
MCH: 22.1 pg — AB (ref 26.0–34.0)
MCH: 23 pg — ABNORMAL LOW (ref 26.0–34.0)
MCHC: 30.7 g/dL (ref 30.0–36.0)
MCHC: 31.3 g/dL (ref 30.0–36.0)
MCV: 71.9 fL — ABNORMAL LOW (ref 78.0–100.0)
MCV: 73.3 fL — ABNORMAL LOW (ref 78.0–100.0)
Platelets: 207 10*3/uL (ref 150–400)
Platelets: 230 10*3/uL (ref 150–400)
RBC: 2.99 MIL/uL — ABNORMAL LOW (ref 3.87–5.11)
RBC: 4.09 MIL/uL (ref 3.87–5.11)
RDW: 22.2 % — AB (ref 11.5–15.5)
RDW: 22.3 % — ABNORMAL HIGH (ref 11.5–15.5)
WBC: 5.2 10*3/uL (ref 4.0–10.5)
WBC: 6.5 10*3/uL (ref 4.0–10.5)

## 2014-06-02 LAB — COMPREHENSIVE METABOLIC PANEL
ALT: 49 U/L — ABNORMAL HIGH (ref 0–35)
AST: 36 U/L (ref 0–37)
Albumin: 2.9 g/dL — ABNORMAL LOW (ref 3.5–5.2)
Alkaline Phosphatase: 88 U/L (ref 39–117)
Anion gap: 3 — ABNORMAL LOW (ref 5–15)
BILIRUBIN TOTAL: 1 mg/dL (ref 0.3–1.2)
BUN: 9 mg/dL (ref 6–23)
CALCIUM: 8.7 mg/dL (ref 8.4–10.5)
CO2: 27 mmol/L (ref 19–32)
Chloride: 112 mmol/L (ref 96–112)
Creatinine, Ser: 1.02 mg/dL (ref 0.50–1.10)
GFR calc Af Amer: 56 mL/min — ABNORMAL LOW (ref >90)
GFR, EST NON AFRICAN AMERICAN: 48 mL/min — AB (ref >90)
Glucose, Bld: 83 mg/dL (ref 70–99)
Potassium: 3.4 mmol/L — ABNORMAL LOW (ref 3.5–5.1)
Sodium: 142 mmol/L (ref 135–145)
TOTAL PROTEIN: 5.6 g/dL — AB (ref 6.0–8.3)

## 2014-06-02 LAB — TSH: TSH: 1.873 u[IU]/mL (ref 0.350–4.500)

## 2014-06-02 LAB — PREPARE RBC (CROSSMATCH)

## 2014-06-02 MED ORDER — GUAIFENESIN-DM 100-10 MG/5ML PO SYRP
5.0000 mL | ORAL_SOLUTION | ORAL | Status: DC | PRN
  Administered 2014-06-02: 5 mL via ORAL
  Filled 2014-06-02: qty 5

## 2014-06-02 MED ORDER — AMLODIPINE BESYLATE 5 MG PO TABS
5.0000 mg | ORAL_TABLET | Freq: Every day | ORAL | Status: DC
  Administered 2014-06-02 – 2014-06-03 (×2): 5 mg via ORAL
  Filled 2014-06-02 (×2): qty 1

## 2014-06-02 MED ORDER — HYDRALAZINE HCL 20 MG/ML IJ SOLN
10.0000 mg | Freq: Once | INTRAMUSCULAR | Status: AC
  Administered 2014-06-02: 10 mg via INTRAVENOUS
  Filled 2014-06-02: qty 1

## 2014-06-02 MED ORDER — SODIUM CHLORIDE 0.9 % IV SOLN
Freq: Once | INTRAVENOUS | Status: AC
  Administered 2014-06-02: 06:00:00 via INTRAVENOUS

## 2014-06-02 NOTE — Progress Notes (Addendum)
PT Cancellation Note  Patient Details Name: Kristi Alexander MRN: [id number] DOB: 05/04/1925   Cancelled Treatment:    Reason Eval/Treat Not Completed: Medical issues which prohibited therapy (pt with Hgb 6.6 and await additional PRBCs). Also noted therapy order not to start until 1449 today. Will defer to next date.   Lanetta Inch Beth 06/02/2014, 7:29 AM Elwyn Reach, [place]

## 2014-06-02 NOTE — Progress Notes (Signed)
[place] TEAM 1 - Stepdown/ICU TEAM Progress Note  Kristi Alexander EXH:[id number] DOB: [birth year] DOA: 06/01/2014 PCP: [name], MD  Admit HPI / Brief Narrative:   Kristi Alexander is a 81 y.o. BF PMHx anemia, hypertension, dyslipidemia, small bowel obstruction status post small bowel resection in 2015. Her 2015 hospitalization was complicated requiring both a PEG and a trach to be placed.  She presents to the emergency department today with severe weakness and nausea and diarrhea. [name] reports that since her surgery she has lost 25 pounds over the last year. In February 2016 she developed intermittent diarrhea and nonproductive cough. She continues to have the symptoms today. At times her cough is so severe that she becomes short of breath and afraid. Recently she has developed pica (eating ice). She has done this in the past when her anemia has become severe. This morning  she was feeling weak and dizzy. She developed nausea and went to the bathroom prepared to vomit and have diarrhea. However she only had abdominal discomfort and severe nausea.   In the ER she is found to have a hemoglobin of 5.3, she is guaiac negative, she has a pulse rate of 39, and a creatinine of 1.3. Her baseline creatinine is 1.0. Her troponin is within normal limits. She will be admitted to stepdown. Her cardiologist, Dr. Terrence Dupont , has agreed to see her in consultation.   HPI/Subjective: 4/8 A/O 4, NAD. States has waxing and waning periods of diarrhea started after her small bowel resection in 2015. States has not noticed melena or frank blood in her stools, negative hematemesis.  Assessment/Plan:  Bradycardia -Per  Dr. Terrence Dupont (cardiology)  -Continue amlodipine 5 mg daily -Continue Cozaar 50 mg daily -Continue metoprolol XL 25  mg daily  Symptomatically anemia -Guaiac negative. She has no frank ecchymosis or bleeding. -Anemia panel pending. Save smear pending. - transfused 2 units PRBC  . -Malabsorption issue secondary to small bowel resection?   Acute renal failure -Likely secondary to dehydration and poor perfusion, resolving with hydration and blood products. -Continue to trend   Restless leg syndrome -Continue Mirapex.  Chronic diastolic heart failure? -Currently hypovolemic and asymptomatic from her diastolic heart failure -Echocardiogram. Pending  Chronic diarrhea -Intermittent since February. GI pathogen panel pending.  -Most likely secondary to patient's small bowel resection  -Nutrition consult decrease future episodes of diarrhea/increase nutritional intake.  Chronic nonproductive cough -Uncertain etiology. Patient does not have any signs of URI. -Chest x-ray is clear. Doubt CHF symptoms (echocardiogram pending). Not on an ACE inhibitor. .      Code Status: FULL Family Communication: no family present at time of exam Disposition Plan: Resolution symptomatic anemia     Consultants: Dr. Terrence Dupont (cardiology)   Procedure/Significant Events:    Culture NA  Antibiotics: NA  DVT prophylaxis: SCD   Devices NA   LINES / TUBES:      Continuous Infusions:   Objective: VITAL SIGNS: Temp: 98.1 F (36.7 C) (04/08 1705) Temp Source: Oral (04/08 1705) BP: 185/56 mmHg (04/08 1705) Pulse Rate: 75 (04/08 1705) SPO2; FIO2:   Intake/Output Summary (Last 24 hours) at 06/02/14 1846 Last data filed at 06/02/14 1800  Gross per 24 hour  Intake    463 ml  Output    700 ml  Net   -237 ml     Exam: General: A/O 4, NAD, No acute respiratory distress Lungs: Clear to auscultation bilaterally without wheezes or crackles Cardiovascular: Regular rate and rhythm without murmur gallop or rub  normal S1 and S2 Abdomen: Nontender, nondistended, soft, bowel sounds positive, no rebound, no ascites, no appreciable mass Extremities: No significant cyanosis, clubbing, or edema bilateral lower extremities  Data Reviewed: Basic Metabolic  Panel:  Recent Labs Lab 06/01/14 1159 06/01/14 1226 06/02/14 0225  NA 138 142 142  K 4.1 4.2 3.4*  CL 109 109 112  CO2 24  --  27  GLUCOSE 116* 113* 83  BUN 14 15 9   CREATININE 1.29* 1.30* 1.02  CALCIUM 8.5  --  8.7   Liver Function Tests:  Recent Labs Lab 06/02/14 0225  AST 36  ALT 49*  ALKPHOS 88  BILITOT 1.0  PROT 5.6*  ALBUMIN 2.9*   No results for input(s): LIPASE, AMYLASE in the last 168 hours. No results for input(s): AMMONIA in the last 168 hours. CBC:  Recent Labs Lab 06/01/14 1159 06/01/14 1226 06/02/14 0225 06/02/14 1330  WBC 5.8  --  5.2 6.5  NEUTROABS 3.8  --   --   --   HGB 5.3* 6.5* 6.6* 9.4*  HCT 18.3* 19.0* 21.5* 30.0*  MCV 69.8*  --  71.9* 73.3*  PLT 210  --  207 230   Cardiac Enzymes:  Recent Labs Lab 06/01/14 1159  TROPONINI <0.03   BNP (last 3 results)  Recent Labs  06/01/14 1820  BNP 1120.8*    ProBNP (last 3 results) No results for input(s): PROBNP in the last 8760 hours.  CBG: No results for input(s): GLUCAP in the last 168 hours.  Recent Results (from the past 240 hour(s))  MRSA PCR Screening     Status: None   Collection Time: 06/01/14  4:01 PM  Result Value Ref Range Status   MRSA by PCR NEGATIVE NEGATIVE Final    Comment:        The GeneXpert MRSA Assay (FDA approved for NASAL specimens only), is one component of a comprehensive MRSA colonization surveillance program. It is not intended to diagnose MRSA infection nor to guide or monitor treatment for MRSA infections.      Studies:  Recent x-ray studies have been reviewed in detail by the Attending Physician  Scheduled Meds:  Scheduled Meds: . amLODipine  5 mg Oral Daily  . aspirin EC  81 mg Oral Daily  . atorvastatin  20 mg Oral QPM  . losartan  50 mg Oral Daily  . metoprolol succinate  25 mg Oral Daily  . pramipexole  0.5 mg Oral Daily  . sodium chloride  3 mL Intravenous Q12H    Time spent on care of this patient: 40 mins   Ruthel Martine,  Geraldo Docker , MD  [facility] Office  [phone] Pager [phone]  On-Call/Text Page:      [email or web address]      password [email or web address]  If 7PM-7AM, please contact night-coverage [email or web address] Password [facility] 06/02/2014, 6:46 PM   LOS: 1 day   Care during the described time interval was provided by me .  I have reviewed this patient's available data, including medical history, events of note, physical examination, radiology studies and test results as part of my evaluation  Dia Crawford, MD [phone] Pager

## 2014-06-02 NOTE — Progress Notes (Signed)
Subjective:  Patient denies any chest pain shortness of bed dizziness lightheadedness. No further episodes of bradycardia. Hemoglobin appropriately increased after one unit of packed RBC etiology of microcytic hypochromic anemia unclear.  Objective:  Vital Signs in the last 24 hours: Temp:  [97.2 F (36.2 C)-98.6 F (37 C)] 98 F (36.7 C) (04/08 0700) Pulse Rate:  [38-102] 38 (04/08 1100) Resp:  [14-28] 16 (04/08 0300) BP: (116-205)/(26-93) 190/47 mmHg (04/08 1100) SpO2:  [89 %-100 %] 99 % (04/08 1100) Weight:  [55.792 kg (123 lb)] 55.792 kg (123 lb) (04/07 1155)  Intake/Output from previous day: 04/07 0701 - 04/08 0700 In: 335 [Blood:335] Out: 300 [Urine:300] Intake/Output from this shift: Total I/O In: 363 [P.O.:360; I.V.:3] Out: 100 [Urine:100]  Physical Exam: Neck: no adenopathy, no carotid bruit, no JVD and supple, symmetrical, trachea midline Lungs: clear to auscultation bilaterally Heart: regular rate and rhythm, S1, S2 normal and Soft systolic murmur noted no S3 gallop Abdomen: soft, non-tender; bowel sounds normal; no masses,  no organomegaly Extremities: extremities normal, atraumatic, no cyanosis or edema  Lab Results:  Recent Labs  06/01/14 1159 06/01/14 1226 06/02/14 0225  WBC 5.8  --  5.2  HGB 5.3* 6.5* 6.6*  PLT 210  --  207    Recent Labs  06/01/14 1159 06/01/14 1226 06/02/14 0225  NA 138 142 142  K 4.1 4.2 3.4*  CL 109 109 112  CO2 24  --  27  GLUCOSE 116* 113* 83  BUN 14 15 9   CREATININE 1.29* 1.30* 1.02    Recent Labs  06/01/14 1159  TROPONINI <0.03   Hepatic Function Panel  Recent Labs  06/02/14 0225  PROT 5.6*  ALBUMIN 2.9*  AST 36  ALT 49*  ALKPHOS 88  BILITOT 1.0   No results for input(s): CHOL in the last 72 hours. No results for input(s): PROTIME in the last 72 hours.  Imaging: Imaging results have been reviewed and Dg Chest Portable 1 View  06/01/2014   CLINICAL DATA:  Weakness and nausea.  EXAM: PORTABLE CHEST -  1 VIEW  COMPARISON:  PA and lateral chest 05/14/2013.  FINDINGS: There is cardiomegaly and vascular congestion. No consolidative process, pneumothorax or effusion is seen. Defibrillator pad is in place.  IMPRESSION: Cardiomegaly without acute disease.   Electronically Signed   By: Inge Rise M.D.   On: 06/01/2014 13:03    Cardiac Studies:  Assessment/Plan:  Status post symptomatic marked sinus bradycardia with junctional escape rhythm secondary to medications Acute on chronic microcytic hypochromic anemia etiology unclear Uncontrolled Hypertension GERD Hypercholesteremia Protein calorie malnutrition Degenerative joint disease History of small bowel resection in the past Plan Start amlodipine 5 mg daily I will sign off please call if needed Follow-up with me in 2 weeks Dr. Doylene Canard on call for weekend  LOS: 1 day    Aryn Safran N 06/02/2014, 11:23 AM

## 2014-06-03 DIAGNOSIS — R001 Bradycardia, unspecified: Secondary | ICD-10-CM

## 2014-06-03 DIAGNOSIS — R55 Syncope and collapse: Secondary | ICD-10-CM

## 2014-06-03 DIAGNOSIS — I272 Pulmonary hypertension, unspecified: Secondary | ICD-10-CM | POA: Diagnosis present

## 2014-06-03 DIAGNOSIS — I270 Primary pulmonary hypertension: Secondary | ICD-10-CM

## 2014-06-03 DIAGNOSIS — E440 Moderate protein-calorie malnutrition: Secondary | ICD-10-CM | POA: Diagnosis present

## 2014-06-03 LAB — COMPREHENSIVE METABOLIC PANEL
ALBUMIN: 3.2 g/dL — AB (ref 3.5–5.2)
ALK PHOS: 91 U/L (ref 39–117)
ALT: 45 U/L — ABNORMAL HIGH (ref 0–35)
AST: 50 U/L — ABNORMAL HIGH (ref 0–37)
Anion gap: 8 (ref 5–15)
BUN: 8 mg/dL (ref 6–23)
CHLORIDE: 109 mmol/L (ref 96–112)
CO2: 22 mmol/L (ref 19–32)
Calcium: 9.1 mg/dL (ref 8.4–10.5)
Creatinine, Ser: 0.94 mg/dL (ref 0.50–1.10)
GFR calc Af Amer: 61 mL/min — ABNORMAL LOW (ref >90)
GFR, EST NON AFRICAN AMERICAN: 53 mL/min — AB (ref >90)
Glucose, Bld: 75 mg/dL (ref 70–99)
Potassium: 4 mmol/L (ref 3.5–5.1)
SODIUM: 139 mmol/L (ref 135–145)
Total Bilirubin: 1.4 mg/dL — ABNORMAL HIGH (ref 0.3–1.2)
Total Protein: 6.1 g/dL (ref 6.0–8.3)

## 2014-06-03 LAB — CBC
HCT: 29.9 % — ABNORMAL LOW (ref 36.0–46.0)
HEMOGLOBIN: 9.4 g/dL — AB (ref 12.0–15.0)
MCH: 23.2 pg — ABNORMAL LOW (ref 26.0–34.0)
MCHC: 31.4 g/dL (ref 30.0–36.0)
MCV: 73.6 fL — AB (ref 78.0–100.0)
PLATELETS: 229 10*3/uL (ref 150–400)
RBC: 4.06 MIL/uL (ref 3.87–5.11)
RDW: 22.5 % — ABNORMAL HIGH (ref 11.5–15.5)
WBC: 6.8 10*3/uL (ref 4.0–10.5)

## 2014-06-03 MED ORDER — HYDRALAZINE HCL 20 MG/ML IJ SOLN
10.0000 mg | Freq: Once | INTRAMUSCULAR | Status: AC
  Administered 2014-06-03: 10 mg via INTRAVENOUS
  Filled 2014-06-03: qty 1

## 2014-06-03 MED ORDER — ISOSORBIDE MONONITRATE 15 MG HALF TABLET
15.0000 mg | ORAL_TABLET | Freq: Every day | ORAL | Status: DC
  Administered 2014-06-03: 15 mg via ORAL
  Filled 2014-06-03 (×3): qty 1

## 2014-06-03 MED ORDER — GLUCERNA SHAKE PO LIQD
237.0000 mL | Freq: Two times a day (BID) | ORAL | Status: DC
  Administered 2014-06-03 – 2014-06-04 (×2): 237 mL via ORAL

## 2014-06-03 NOTE — Progress Notes (Signed)
[place] TEAM 1 - Stepdown/ICU TEAM Progress Note  Kristi Alexander YBO:[id number] DOB: 1925-04-23 DOA: 06/01/2014 PCP: [name], MD  Admit HPI / Brief Narrative:  Kristi Alexander is a 81 y.o. BF PMHx anemia, hypertension, dyslipidemia, small bowel obstruction status post small bowel resection in 2015. Her 2015 hospitalization was complicated requiring both a PEG and a trach to be placed.  She presents to the emergency department today with severe weakness and nausea and diarrhea. Kristi Alexander reports that since her surgery she has lost 25 pounds over the last year. In February 2016 she developed intermittent diarrhea and nonproductive cough. She continues to have the symptoms today. At times her cough is so severe that she becomes short of breath and afraid. Recently she has developed pica (eating ice). She has done this in the past when her anemia has become severe. This morning  she was feeling weak and dizzy. She developed nausea and went to the bathroom prepared to vomit and have diarrhea. However she only had abdominal discomfort and severe nausea.   In the ER she is found to have a hemoglobin of 5.3, she is guaiac negative, she has a pulse rate of 39, and a creatinine of 1.3. Her baseline creatinine is 1.0. Her troponin is within normal limits. She will be admitted to stepdown. Her cardiologist, Dr. Terrence Dupont , has agreed to see her in consultation.   HPI/Subjective: 4/8 A/O 4, NAD. States has waxing and waning periods of diarrhea started after her small bowel resection in 2015. States has not noticed melena or frank blood in her stools, negative hematemesis.  Assessment/Plan:  Bradycardia -Per  Dr. Terrence Dupont (cardiology)  -DC amlodipine 5 mg daily -Continue Cozaar 50 mg daily -Continue metoprolol XL 25  mg daily -start Imdur 24-hour 15mg  daily; will help with patient's pulmonary hypertension  Symptomatically anemia -Guaiac negative. She has no frank ecchymosis or  bleeding. -Anemia panel pending. Save smear pending. - transfused 2 units PRBC . -Malabsorption issue secondary to small bowel resection?   Acute renal failure -Likely secondary to dehydration and poor perfusion, resolving with hydration and blood products. -Continue to trend   Restless leg syndrome -Continue Mirapex.  Chronic diastolic heart failure? -Currently hypovolemic and asymptomatic from her diastolic heart failure -Echocardiogram; diastolic dysfunction/pulmonary hypertension -continue metoprolol XL 25 mg daily per cardiology -Continue losartan 50 mg daily   Pulmonary hypertension -See bradycardia and chronic diastolic heart failure  Chronic diarrhea -Intermittent since February. GI pathogen panel pending.  -Most likely secondary to patient's small bowel resection  -Nutrition consult decrease future episodes of diarrhea/increase nutritional intake; see moderate malnutrition.  Chronic nonproductive cough -Uncertain etiology. Patient does not have any signs of URI. -Chest x-ray is clear. -Patient has been working with PT/ OT without recurrence of cough.  .  Moderate malnutrition -Dietitian consult -Educated on how to reduce diarrhea and stay hydrated through diet. Gave ed material -Glucerna Shake po BID    Code Status: FULL Family Communication: no family present at time of exam Disposition Plan: Resolution symptomatic anemia     Consultants: Dr. Terrence Dupont (cardiology)   Procedure/Significant Events: 4/9 echocardiogram;Left ventricle: moderate LVH.-LVEF= 50%to 55%. - (grade 1 diastolic dysfunction). - Pulmonary arteries: PA peak pressure: 47 mm Hg (S). - Pericardium, extracardiac: A trivial pericardial effusion    Culture NA  Antibiotics: NA  DVT prophylaxis: SCD   Devices NA   LINES / TUBES:      Continuous Infusions:   Objective: VITAL SIGNS: Temp: 97.9 F (36.6 C) (  04/09 1107) Temp Source: Oral (04/09 1107) BP: 146/65 mmHg (04/09  1107) Pulse Rate: 75 (04/09 1107) SPO2; FIO2:   Intake/Output Summary (Last 24 hours) at 06/03/14 1626 Last data filed at 06/02/14 1800  Gross per 24 hour  Intake    100 ml  Output      0 ml  Net    100 ml     Exam: General: A/O 4, NAD, No acute respiratory distress Lungs: Clear to auscultation bilaterally without wheezes or crackles Cardiovascular: Regular rate and rhythm without murmur gallop or rub normal S1 and S2 Abdomen: Nontender, nondistended, soft, bowel sounds positive, no rebound, no ascites, no appreciable mass Extremities: No significant cyanosis, clubbing, or edema bilateral lower extremities  Data Reviewed: Basic Metabolic Panel:  Recent Labs Lab 06/01/14 1159 06/01/14 1226 06/02/14 0225 06/03/14 0255  NA 138 142 142 139  K 4.1 4.2 3.4* 4.0  CL 109 109 112 109  CO2 24  --  27 22  GLUCOSE 116* 113* 83 75  BUN 14 15 9 8   CREATININE 1.29* 1.30* 1.02 0.94  CALCIUM 8.5  --  8.7 9.1   Liver Function Tests:  Recent Labs Lab 06/02/14 0225 06/03/14 0255  AST 36 50*  ALT 49* 45*  ALKPHOS 88 91  BILITOT 1.0 1.4*  PROT 5.6* 6.1  ALBUMIN 2.9* 3.2*   No results for input(s): LIPASE, AMYLASE in the last 168 hours. No results for input(s): AMMONIA in the last 168 hours. CBC:  Recent Labs Lab 06/01/14 1159 06/01/14 1226 06/02/14 0225 06/02/14 1330 06/03/14 0255  WBC 5.8  --  5.2 6.5 6.8  NEUTROABS 3.8  --   --   --   --   HGB 5.3* 6.5* 6.6* 9.4* 9.4*  HCT 18.3* 19.0* 21.5* 30.0* 29.9*  MCV 69.8*  --  71.9* 73.3* 73.6*  PLT 210  --  207 230 229   Cardiac Enzymes:  Recent Labs Lab 06/01/14 1159  TROPONINI <0.03   BNP (last 3 results)  Recent Labs  06/01/14 1820  BNP 1120.8*    ProBNP (last 3 results) No results for input(s): PROBNP in the last 8760 hours.  CBG: No results for input(s): GLUCAP in the last 168 hours.  Recent Results (from the past 240 hour(s))  MRSA PCR Screening     Status: None   Collection Time: 06/01/14   4:01 PM  Result Value Ref Range Status   MRSA by PCR NEGATIVE NEGATIVE Final    Comment:        The GeneXpert MRSA Assay (FDA approved for NASAL specimens only), is one component of a comprehensive MRSA colonization surveillance program. It is not intended to diagnose MRSA infection nor to guide or monitor treatment for MRSA infections.      Studies:  Recent x-ray studies have been reviewed in detail by the Attending Physician  Scheduled Meds:  Scheduled Meds: . amLODipine  5 mg Oral Daily  . aspirin EC  81 mg Oral Daily  . atorvastatin  20 mg Oral QPM  . feeding supplement (GLUCERNA SHAKE)  237 mL Oral BID BM  . losartan  50 mg Oral Daily  . metoprolol succinate  25 mg Oral Daily  . pramipexole  0.5 mg Oral Daily  . sodium chloride  3 mL Intravenous Q12H    Time spent on care of this patient: 40 mins   Carriann Hesse, Geraldo Docker , MD  [facility] Office  [phone] Pager [phone]  On-Call/Text Page:  [email or web address]      password [email or web address]  If 7PM-7AM, please contact night-coverage [email or web address] Password [email or web address] 06/03/2014, 4:26 PM   LOS: 2 days   Care during the described time interval was provided by me .  I have reviewed this patient's available data, including medical history, events of note, physical examination, radiology studies and test results as part of my evaluation  Dia Crawford, MD [phone] Pager

## 2014-06-03 NOTE — Progress Notes (Signed)
  Echocardiogram 2D Echocardiogram has been performed.  Kristi Alexander 06/03/2014, 12:01 PM

## 2014-06-03 NOTE — Progress Notes (Signed)
INITIAL NUTRITION ASSESSMENT Pt meets criteria for MODERATE MALNUTRITION in the context of CHRONIC illness as evidenced by moderate muscle/fat wasting. DOCUMENTATION CODES Per approved criteria  -Non-severe (moderate) malnutrition in the context of chronic illness   INTERVENTION: -Educated on how to reduce diarrhea and stay hydrated through diet. Gave ed material  -Glucerna Shake po BID, each supplement provides 220 kcal and 10 grams of protein   NUTRITION DIAGNOSIS: Altered GI function related to bowel resection as evidenced by recurrent diarrhea and documented wt loss of 14 lbs in 1 year.   Goal: Pt to meet >/= 90% of their estimated nutrition needs   Eliminate diarrhea and stay hydrated  Monitor:  Oral intake, weight, diarrhea, fluid status, labs  Reason for Assessment: Consult to help pt reduce diarrhea  81 y.o. female  Admitting Dx: Bradycardia  ASSESSMENT: 81 y.o. BF PMHx anemia, hypertension, dyslipidemia, small bowel obstruction status post small bowel resection in 2015. She presents to the ED today with severe weakness and nausea and diarrhea. Kristi Alexander reports that since her surgery she has lost 25 pounds over the last year  Per notes: pt had 4 ft Sb removed. Mid jejunum-proximal ileum  Pt states because of her diarrhea she has had a poor appetite, but recently has made an attempt to eat more to make up for lost weight.   States her normal weight is 140 lbs  Talked about how her gut function has been altered s/p resection.  Went over multiple recommendations to reduce diarrhea and stay hydrated:  -Pt states she drinks a lot of juice. Educated patient on the effects of sugary foods/drinks on the GI tract and how they pull water from the body. Instead of boost/ensure told her to do lower sugar drinks like boost glucose control or glucerna  -Avoid alcohol, caffeine, sugar alcohols  -Educated on importance of fiber and Reccommended having a "thickening food" with  each meal to form stool ie: Banana, pasta, tapioca, rice, and other complex carbs. Also, advised pt to start taking a fiber supplement  -Reccommended separating fluids from meals to slow digestion and give her body more time to absorb nutrients  -Told to chew foods very well and eat slowly so body has more time and less work to do when digesting  -Pt states she does not like water. She does like pedialyte. Educated importance of drinking >64 oz fluid daily and if she has diarrhea to drink an oral rehydration solution ie peidialyte, ceralyte G2 w/ salt,     Nutrition Focused Physical Exam:  Subcutaneous Fat:  Orbital Region: Age appropritate Upper Arm Region: moderate-severe fat loss Thoracic and Lumbar Region: N/a  Muscle:  Temple Region: Mild muscle loss Clavicle Bone Region: moderate muscle loss Clavicle and Acromion Bone Region: moderate muscle loss Scapular Bone Region: N/A Dorsal Hand: age appropriate Patellar Region: mild muscle loss Anterior Thigh Region: mild muscle loss Posterior Calf Region: mild muscle loss  Edema: none    Height: Ht Readings from Last 1 Encounters:  06/01/14 5\' 1"  (1.549 m)    Weight: Wt Readings from Last 1 Encounters:  06/01/14 123 lb (55.792 kg)    Ideal Body Weight: 105 lbs  % Ideal Body Weight: 117%  Wt Readings from Last 10 Encounters:  06/01/14 123 lb (55.792 kg)  07/26/13 130 lb (58.968 kg)  07/19/13 130 lb (58.968 kg)  05/19/13 137 lb 14.4 oz (62.551 kg)  04/22/13 134 lb 4.2 oz (60.9 kg)  04/19/13 157 lb 13.6 oz (71.6 kg)  01/19/13 144 lb (65.318 kg)  07/21/12 145 lb (65.772 kg)  06/17/11 143 lb (64.864 kg)    Usual Body Weight: 140 lbs  % Usual Body Weight: 88%  BMI:  Body mass index is 23.25 kg/(m^2).  Estimated Nutritional Needs:  higher to account for some malabsorption Kcal: 1650-1950 (30-35 kcal/kg) Protein:78-89 (1.4-1.6 g/kg) Fluid: 1.7-2 liters plus enough to replace fluid lost in diarrhea  Skin:  WDL  Diet Order: Diet regular Room service appropriate?: Yes; Fluid consistency:: Thin  EDUCATION NEEDS: -Education needs addressed   Intake/Output Summary (Last 24 hours) at 06/03/14 0831 Last data filed at 06/02/14 1800  Gross per 24 hour  Intake    463 ml  Output    300 ml  Net    163 ml    Last BM: Unknown   Labs:   Recent Labs Lab 06/01/14 1159 06/01/14 1226 06/02/14 0225 06/03/14 0255  NA 138 142 142 139  K 4.1 4.2 3.4* 4.0  CL 109 109 112 109  CO2 24  --  27 22  BUN 14 15 9 8   CREATININE 1.29* 1.30* 1.02 0.94  CALCIUM 8.5  --  8.7 9.1  GLUCOSE 116* 113* 83 75    CBG (last 3)  No results for input(s): GLUCAP in the last 72 hours.  Scheduled Meds: . amLODipine  5 mg Oral Daily  . aspirin EC  81 mg Oral Daily  . atorvastatin  20 mg Oral QPM  . losartan  50 mg Oral Daily  . metoprolol succinate  25 mg Oral Daily  . pramipexole  0.5 mg Oral Daily  . sodium chloride  3 mL Intravenous Q12H    Continuous Infusions:   Past Medical History  Diagnosis Date  . Restless leg syndrome   . Hypertension   . Dyslipidemia   . History of appendectomy   . H/O: hysterectomy   . Cyst of breast, right, benign solitary   . Inguinal hernia   . History of lumbosacral spine surgery   . Arthritis   . HHNC (hyperglycemic hyperosmolar nonketotic coma) 05/14/2013    Past Surgical History  Procedure Laterality Date  . Appendectomy    . Abdominal hysterectomy    . Laparotomy N/A 03/23/2013    Procedure: EXPLORATORY LAPAROTOMY;  Surgeon: Gwenyth Ober, MD;  Location: [facility];  Service: General;  Laterality: N/A;  . Bowel resection N/A 03/23/2013    Procedure: SMALL BOWEL RESECTION;  Surgeon: Gwenyth Ober, MD;  Location: [facility];  Service: General;  Laterality: N/A;  . Tracheostomy      feinstein  . Esophagogastroduodenoscopy N/A 04/01/2013    Procedure: ESOPHAGOGASTRODUODENOSCOPY (EGD);  Surgeon: Gwenyth Ober, MD;  Location[facility] ENDOSCOPY;  Service: General;  Laterality: N/A;   . Peg placement N/A 04/01/2013    Procedure: PERCUTANEOUS ENDOSCOPIC GASTROSTOMY (PEG) PLACEMENT;  Surgeon: Gwenyth Ober, MD;  Location: [facility];  Service: General;  Laterality: N/A;    Burtis Junes RD, LDN Nutrition Pager: [phone] 06/03/2014 8:31 AM

## 2014-06-03 NOTE — Evaluation (Signed)
Physical Therapy Evaluation / Discharge Patient Details Name: Kristi Alexander MRN: [id number] DOB: [birth year] Today's Date: 06/03/2014   History of Present Illness  81 yo F admitted with weakness, found to have bradycardia with hypotension as well as significant anemia with Hb of 5. She has no reported bleeding and FOBT was negative in the ED. Pt has received PRBCs since admission. Hx of bowel resection and diarrhea since that time  Clinical Impression  Pt very pleasant who lost spouse a few months ago. She lives alone, drives and cares for herself without difficulty with mobility or falls since D/C from SNF at bowel resection a year ago. Pt alert and oriented who enjoys moving and caring for herself. Pt able to perform all basic mobility and gait without difficulty or assist and currently has no therapy needs. Pt encouraged to continue daily ambulation with nursing supervision and to be OOB throughout the day. Pt agreeable with all above and will sign off.     Follow Up Recommendations No PT follow up    Equipment Recommendations  None recommended by PT    Recommendations for Other Services       Precautions / Restrictions Precautions Precautions: None      Mobility  Bed Mobility Overal bed mobility: Independent                Transfers Overall transfer level: Independent                  Ambulation/Gait Ambulation/Gait assistance: Independent Ambulation Distance (Feet): 400 Feet Assistive device: None Gait Pattern/deviations: WFL(Within Functional Limits)   Gait velocity interpretation: at or above normal speed for age/gender    Stairs            Wheelchair Mobility    Modified Rankin (Stroke Patients Only)       Balance Overall balance assessment: No apparent balance deficits (not formally assessed)                                           Pertinent Vitals/Pain Pain Assessment: No/denies pain  VSS    Home Living  Family/patient expects to be discharged to:: Private residence Living Arrangements: Alone Available Help at Discharge: Available PRN/intermittently Type of Home: House       Home Layout: One level Home Equipment: Environmental consultant - 2 wheels;Cane - single point      Prior Function Level of Independence: Independent               Hand Dominance        Extremity/Trunk Assessment   Upper Extremity Assessment: Overall WFL for tasks assessed           Lower Extremity Assessment: Overall WFL for tasks assessed      Cervical / Trunk Assessment: Normal  Communication   Communication: No difficulties  Cognition Arousal/Alertness: Awake/alert Behavior During Therapy: WFL for tasks assessed/performed Overall Cognitive Status: Within Functional Limits for tasks assessed                      General Comments      Exercises        Assessment/Plan    PT Assessment Patent does not need any further PT services  PT Diagnosis Other (comment) (PT consult order for pt with anemia)   PT Problem List    PT Treatment Interventions  PT Goals (Current goals can be found in the Care Plan section) Acute Rehab PT Goals PT Goal Formulation: All assessment and education complete, DC therapy    Frequency     Barriers to discharge        Co-evaluation               End of Session   Activity Tolerance: Patient tolerated treatment well Patient left: in chair;with call bell/phone within reach;with nursing/sitter in room Nurse Communication: Mobility status         Time: 1050-1100 PT Time Calculation (min) (ACUTE ONLY): 10 min   Charges:   PT Evaluation $Initial PT Evaluation Tier I: 1 Procedure     PT G CodesMelford Aase 06/03/2014, 11:09 AM Elwyn Reach, [place]

## 2014-06-04 LAB — COMPREHENSIVE METABOLIC PANEL
ALBUMIN: 2.9 g/dL — AB (ref 3.5–5.2)
ALK PHOS: 89 U/L (ref 39–117)
ALT: 32 U/L (ref 0–35)
ANION GAP: 7 (ref 5–15)
AST: 23 U/L (ref 0–37)
BILIRUBIN TOTAL: 1 mg/dL (ref 0.3–1.2)
BUN: 18 mg/dL (ref 6–23)
CHLORIDE: 107 mmol/L (ref 96–112)
CO2: 26 mmol/L (ref 19–32)
Calcium: 8.9 mg/dL (ref 8.4–10.5)
Creatinine, Ser: 1.01 mg/dL (ref 0.50–1.10)
GFR calc Af Amer: 56 mL/min — ABNORMAL LOW (ref >90)
GFR calc non Af Amer: 49 mL/min — ABNORMAL LOW (ref >90)
GLUCOSE: 104 mg/dL — AB (ref 70–99)
POTASSIUM: 3.6 mmol/L (ref 3.5–5.1)
SODIUM: 140 mmol/L (ref 135–145)
TOTAL PROTEIN: 5.9 g/dL — AB (ref 6.0–8.3)

## 2014-06-04 LAB — CBC
HEMATOCRIT: 28.3 % — AB (ref 36.0–46.0)
Hemoglobin: 8.6 g/dL — ABNORMAL LOW (ref 12.0–15.0)
MCH: 22.3 pg — AB (ref 26.0–34.0)
MCHC: 30.4 g/dL (ref 30.0–36.0)
MCV: 73.3 fL — AB (ref 78.0–100.0)
Platelets: 213 10*3/uL (ref 150–400)
RBC: 3.86 MIL/uL — ABNORMAL LOW (ref 3.87–5.11)
RDW: 23.6 % — ABNORMAL HIGH (ref 11.5–15.5)
WBC: 5.2 10*3/uL (ref 4.0–10.5)

## 2014-06-04 MED ORDER — ISOSORBIDE MONONITRATE ER 30 MG PO TB24: 45.0000 mg | ORAL_TABLET | Freq: Every day | ORAL | Status: DC

## 2014-06-04 MED ORDER — METOPROLOL SUCCINATE ER 25 MG PO TB24: 25.0000 mg | ORAL_TABLET | Freq: Every day | ORAL | Status: DC

## 2014-06-04 MED ORDER — GLUCERNA SHAKE PO LIQD: 237.0000 mL | Freq: Two times a day (BID) | ORAL | Status: DC

## 2014-06-04 MED ORDER — LOSARTAN POTASSIUM 50 MG PO TABS: 50.0000 mg | ORAL_TABLET | Freq: Every day | ORAL | Status: DC

## 2014-06-04 MED ORDER — ISOSORBIDE MONONITRATE 15 MG HALF TABLET
15.0000 mg | ORAL_TABLET | Freq: Every day | ORAL | Status: DC
  Administered 2014-06-04: 15 mg via ORAL

## 2014-06-04 MED ORDER — ISOSORBIDE MONONITRATE ER 30 MG PO TB24
30.0000 mg | ORAL_TABLET | Freq: Every day | ORAL | Status: DC
  Administered 2014-06-04: 30 mg via ORAL
  Filled 2014-06-04: qty 1

## 2014-06-04 NOTE — Progress Notes (Signed)
Discharge teaching completed with pt using the teachback method. Pt was able to teachback all instructions given including new medications. IV removed. Pt taken to pt pickup via wheelchair and assisted into vehicle.

## 2014-06-04 NOTE — Discharge Summary (Addendum)
Physician Discharge Summary  ANNE BOLTZ JOA:[id number] DOB: September 04, 1925 DOA: 06/01/2014  PCP: [name], MD  Admit date: 06/01/2014 Discharge date: 06/04/2014  Time spent: 40 minutes  Recommendations for Outpatient Follow-up:   Bradycardia -Per Dr. Terrence Dupont (cardiology)  -DC amlodipine 5 mg daily -Continue Cozaar 50 mg daily -Continue metoprolol XL 25 mg daily -Discharge on Imdur (24-hour) 45mg  daily; will help with patient's pulmonary hypertension -Follow-up with PCP and cardiologist for further adjustment BP medication.  Symptomatically anemia -Guaiac negative. She has no frank ecchymosis or bleeding. - transfused 2 units PRBC . -Malabsorption issue secondary to small bowel resection?  -PCP to conduct anemia workup as outpatient.  Acute renal failure -Likely secondary to dehydration and poor perfusion, resolving with hydration and blood products. -Continue to trend   Restless leg syndrome -Continue Mirapex.  Chronic diastolic heart failure? -Currently hypovolemic and asymptomatic from her diastolic heart failure -Echocardiogram; diastolic dysfunction/pulmonary hypertension -continue metoprolol XL 25 mg daily per cardiology -Continue losartan 50 mg daily -Imdur ( 24 hour) 45 mg daily  Pulmonary hypertension -See bradycardia and chronic diastolic heart failure  Chronic diarrhea -Intermittent since February. GI pathogen panel pending.  -Most likely secondary to patient's small bowel resection  -Nutrition consult decrease future episodes of diarrhea/increase nutritional intake; see moderate malnutrition.  Chronic nonproductive cough -Patient does not have any signs of URI. -Chest x-ray is clear. -Patient has been working with PT/ OT without recurrence of cough.  -If cough recurs would recommend that PCP discontinue ARB. Although less likely to cause nonproductive cough ARB still has increased risk for causing refractory dry cough.  .  Moderate  malnutrition -Dietitian consulted  -Educated on how to reduce diarrhea and stay hydrated through diet. Gave ed material -Glucerna Shake po BID     Discharge Diagnoses:  Principal Problem:   Bradycardia Active Problems:   Restless legs syndrome (RLS)   Chronic diastolic heart failure   Symptomatic anemia   Acute on chronic renal failure   Anemia   Symptomatic bradycardia   Acute renal failure syndrome   Chronic diarrhea   Chronic cough   Malnutrition of moderate degree   Absolute anemia   Pulmonary hypertension   Discharge Condition: Stable  Diet recommendation: Heart healthy  Filed Weights   06/01/14 1155  Weight: 55.792 kg (123 lb)    History of present illness:  Kristi Alexander is a 81 y.o. BF PMHx anemia, hypertension, dyslipidemia, small bowel obstruction status post small bowel resection in 2015. Her 2015 hospitalization was complicated requiring both a PEG and a trach to be placed.  She presents to the emergency department today with severe weakness and nausea and diarrhea. Ms. Hallmon reports that since her surgery she has lost 25 pounds over the last year. In February 2016 she developed intermittent diarrhea and nonproductive cough. She continues to have the symptoms today. At times her cough is so severe that she becomes short of breath and afraid. Recently she has developed pica (eating ice). She has done this in the past when her anemia has become severe. This morning  she was feeling weak and dizzy. She developed nausea and went to the bathroom prepared to vomit and have diarrhea. However she only had abdominal discomfort and severe nausea.   In the ER she is found to have a hemoglobin of 5.3, she is guaiac negative, she has a pulse rate of 39, and a creatinine of 1.3. Her baseline creatinine is 1.0. Her troponin is within normal limits. She will be admitted  to stepdown. Her cardiologist, Dr. Terrence Dupont , has agreed to see her in consultation.  During patient's  stay Dr. Terrence Dupont (cardiology) adjusted some of patient's cardiac medication to eliminate her symptomatic bradycardia. In addition patient was treated for symptomatic anemia most likely cause from malabsorption secondary to her small bowel resection. No evidence of GI bleed was diagnosed. Patient was rehydrated secondary to her dehydration and transfused 2 units PRBC for her anemia with all symptoms resolving.    Consultants: Dr. Terrence Dupont (cardiology)    Procedure/Significant Events: 4/9 echocardiogram;Left ventricle: moderate LVH.-LVEF= 50%to 55%. - (grade 1 diastolic dysfunction). - Pulmonary arteries: PA peak pressure: 47 mm Hg (S). - Pericardium, extracardiac: A trivial pericardial effusion    Discharge Exam: Filed Vitals:   06/04/14 0418 06/04/14 0826 06/04/14 1251 06/04/14 1252  BP: 153/52 190/65  150/65  Pulse:  67 77   Temp: 98.3 F (36.8 C) 98.3 F (36.8 C) 98.4 F (36.9 C)   TempSrc: Oral Oral Oral   Resp:  19  21  Height:      Weight:      SpO2: 100% 100%  100%    General: A/O 4, NAD, No acute respiratory distress, sitting comfortably in bed, negative cough Lungs: Clear to auscultation bilaterally without wheezes or crackles Cardiovascular: Regular rate and rhythm without murmur gallop or rub normal S1 and S2 Abdomen: Nontender, nondistended, soft, bowel sounds positive, no rebound, no ascites, no appreciable mass Extremities: No significant cyanosis, clubbing, or edema bilateral lower extremities  Discharge Instructions     Medication List    STOP taking these medications        amLODipine 5 MG tablet  Commonly known as:  NORVASC     verapamil 240 MG CR tablet  Commonly known as:  CALAN-SR     verapamil 40 MG tablet  Commonly known as:  CALAN      TAKE these medications        aspirin 81 MG tablet  81 mg by Gastric Tube route daily.     atorvastatin 20 MG tablet  Commonly known as:  LIPITOR  20 mg by Gastric Tube route every evening. 5pm      feeding supplement (GLUCERNA SHAKE) Liqd  Take 237 mLs by mouth 2 (two) times daily between meals.     ferrous sulfate 325 (65 FE) MG tablet  325 mg by Gastric Tube route daily.     isosorbide mononitrate 30 MG 24 hr tablet  Commonly known as:  IMDUR  Take 1.5 tablets (45 mg total) by mouth daily.  Start taking on:  06/05/2014     losartan 50 MG tablet  Commonly known as:  COZAAR  Take 1 tablet (50 mg total) by mouth daily.     metoprolol succinate 25 MG 24 hr tablet  Commonly known as:  TOPROL-XL  Take 1 tablet (25 mg total) by mouth daily.     pramipexole 0.5 MG tablet  Commonly known as:  MIRAPEX  Take 0.5 mg by mouth daily.       Allergies  Allergen Reactions  . Penicillins Swelling   Follow-up Information    Follow up with [name], MD. Schedule an appointment as soon as possible for a visit in 1 week.   Specialty:  Family Medicine   Why:  Hospital follow-up; symptomatic bradycardia, central hypertension uncontrolled, symptomatically anemia   Contact information:   [place] [address] [phone]        The results of  significant diagnostics from this hospitalization (including imaging, microbiology, ancillary and laboratory) are listed below for reference.    Significant Diagnostic Studies: Dg Chest Portable 1 View  06/01/2014   CLINICAL DATA:  Weakness and nausea.  EXAM: PORTABLE CHEST - 1 VIEW  COMPARISON:  PA and lateral chest 05/14/2013.  FINDINGS: There is cardiomegaly and vascular congestion. No consolidative process, pneumothorax or effusion is seen. Defibrillator pad is in place.  IMPRESSION: Cardiomegaly without acute disease.   Electronically Signed   By: Inge Rise M.D.   On: 06/01/2014 13:03    Microbiology: Recent Results (from the past 240 hour(s))  MRSA PCR Screening     Status: None   Collection Time: 06/01/14  4:01 PM  Result Value Ref Range Status   MRSA by PCR NEGATIVE NEGATIVE Final    Comment:         The GeneXpert MRSA Assay (FDA approved for NASAL specimens only), is one component of a comprehensive MRSA colonization surveillance program. It is not intended to diagnose MRSA infection nor to guide or monitor treatment for MRSA infections.      Labs: Basic Metabolic Panel:  Recent Labs Lab 06/01/14 1159 06/01/14 1226 06/02/14 0225 06/03/14 0255 06/04/14 0350  NA 138 142 142 139 140  K 4.1 4.2 3.4* 4.0 3.6  CL 109 109 112 109 107  CO2 24  --  27 22 26   GLUCOSE 116* 113* 83 75 104*  BUN 14 15 9 8 18   CREATININE 1.29* 1.30* 1.02 0.94 1.01  CALCIUM 8.5  --  8.7 9.1 8.9   Liver Function Tests:  Recent Labs Lab 06/02/14 0225 06/03/14 0255 06/04/14 0350  AST 36 50* 23  ALT 49* 45* 32  ALKPHOS 88 91 89  BILITOT 1.0 1.4* 1.0  PROT 5.6* 6.1 5.9*  ALBUMIN 2.9* 3.2* 2.9*   No results for input(s): LIPASE, AMYLASE in the last 168 hours. No results for input(s): AMMONIA in the last 168 hours. CBC:  Recent Labs Lab 06/01/14 1159 06/01/14 1226 06/02/14 0225 06/02/14 1330 06/03/14 0255 06/04/14 0350  WBC 5.8  --  5.2 6.5 6.8 5.2  NEUTROABS 3.8  --   --   --   --   --   HGB 5.3* 6.5* 6.6* 9.4* 9.4* 8.6*  HCT 18.3* 19.0* 21.5* 30.0* 29.9* 28.3*  MCV 69.8*  --  71.9* 73.3* 73.6* 73.3*  PLT 210  --  207 230 229 213   Cardiac Enzymes:  Recent Labs Lab 06/01/14 1159  TROPONINI <0.03   BNP: BNP (last 3 results)  Recent Labs  06/01/14 1820  BNP 1120.8*    ProBNP (last 3 results) No results for input(s): PROBNP in the last 8760 hours.  CBG: No results for input(s): GLUCAP in the last 168 hours.     Signed:  Dia Crawford, MD [facility] [phone] pager

## 2014-06-05 LAB — TYPE AND SCREEN
ABO/RH(D): O POS
ANTIBODY SCREEN: NEGATIVE
Unit division: 0
Unit division: 0
Unit division: 0

## 2014-06-08 ENCOUNTER — Encounter: Payer: Self-pay | Admitting: Neurology

## 2014-06-08 ENCOUNTER — Ambulatory Visit (INDEPENDENT_AMBULATORY_CARE_PROVIDER_SITE_OTHER): Payer: Medicare Other | Admitting: Neurology

## 2014-06-08 VITALS — BP 188/78 | HR 74 | Ht 62.0 in | Wt 119.0 lb

## 2014-06-08 DIAGNOSIS — G2581 Restless legs syndrome: Secondary | ICD-10-CM

## 2014-06-08 MED ORDER — PRAMIPEXOLE DIHYDROCHLORIDE 0.5 MG PO TABS: 0.5000 mg | ORAL_TABLET | Freq: Every day | ORAL | Status: DC

## 2014-06-08 NOTE — Patient Instructions (Signed)
Restless Legs Syndrome Restless legs syndrome is a movement disorder. It may also be called a sensorimotor disorder.  CAUSES  No one knows what specifically causes restless legs syndrome, but it tends to run in families. It is also more common in people with low iron, in pregnancy, in people who need dialysis, and those with nerve damage (neuropathy).Some medications may make restless legs syndrome worse.Those medications include drugs to treat high blood pressure, some heart conditions, nausea, colds, allergies, and depression. SYMPTOMS Symptoms include uncomfortable sensations in the legs. These leg sensations are worse during periods of inactivity or rest. They are also worse while sitting or lying down. Individuals that have the disorder describe sensations in the legs that feel like:  Pulling.  Drawing.  Crawling.  Worming.  Boring.  Tingling.  Pins and needles.  Prickling.  Pain. The sensations are usually accompanied by an overwhelming urge to move the legs. Sudden muscle jerks may also occur. Movement provides temporary relief from the discomfort. In rare cases, the arms may also be affected. Symptoms may interfere with going to sleep (sleep onset insomnia). Restless legs syndrome may also be related to periodic limb movement disorder (PLMD). PLMD is another more common motor disorder. It also causes interrupted sleep. The symptoms from PLMD usually occur most often when you are awake. TREATMENT  Treatment for restless legs syndrome is symptomatic. This means that the symptoms are treated.   Massage and cold compresses may provide temporary relief.  Walk, stretch, or take a cold or hot bath.  Get regular exercise and a good night's sleep.  Avoid caffeine, alcohol, nicotine, and medications that can make it worse.  Do activities that provide mental stimulation like discussions, needlework, and video games. These may be helpful if you are not able to walk or stretch. Some  medications are effective in relieving the symptoms. However, many of these medications have side effects. Ask your caregiver about medications that may help your symptoms. Correcting iron deficiency may improve symptoms for some patients. Document Released: 01/31/2002 Document Revised: 06/27/2013 Document Reviewed: 05/09/2010 Exit[employer] Patient Information 2015 [employer]. This information is not intended to replace advice given to you by your health care provider. Make sure you discuss any questions you have with your health care provider.  

## 2014-06-08 NOTE — Progress Notes (Addendum)
Reason for visit: Restless leg syndrome  Kristi Alexander is an 81 y.o. female  History of present illness:  Kristi Alexander is an 81 year old right-handed black female with a history of restless leg syndrome. The patient was just discharged from the hospital on 06/04/2014 with a significant anemia with hemoglobin in the 6 range. The patient required transfusion of 2 units. The etiology of her anemia is not clear. The patient has had a prior small bowel resection. The patient has complained of some fatigue associated with this anemia. She indicates that the Mirapex that she takes for the restless leg syndrome is effective to help her sleep. She denies any particular increase in her restless leg symptoms. She does have some chronic gait instability, she uses a cane for ambulation, she denies any recent falls. She returns for an evaluation.  Past Medical History  Diagnosis Date  . Restless leg syndrome   . Hypertension   . Dyslipidemia   . History of appendectomy   . H/O: hysterectomy   . Cyst of breast, right, benign solitary   . Inguinal hernia   . History of lumbosacral spine surgery   . Arthritis   . HHNC (hyperglycemic hyperosmolar nonketotic coma) 05/14/2013    Past Surgical History  Procedure Laterality Date  . Appendectomy    . Abdominal hysterectomy    . Laparotomy N/A 03/23/2013    Procedure: EXPLORATORY LAPAROTOMY;  Surgeon: Gwenyth Ober, MD;  Location: [facility];  Service: General;  Laterality: N/A;  . Bowel resection N/A 03/23/2013    Procedure: SMALL BOWEL RESECTION;  Surgeon: Gwenyth Ober, MD;  Location: [facility];  Service: General;  Laterality: N/A;  . Tracheostomy      feinstein  . Esophagogastroduodenoscopy N/A 04/01/2013    Procedure: ESOPHAGOGASTRODUODENOSCOPY (EGD);  Surgeon: Gwenyth Ober, MD;  Location: [facility];  Service: General;  Laterality: N/A;  . Peg placement N/A 04/01/2013    Procedure: PERCUTANEOUS ENDOSCOPIC GASTROSTOMY (PEG) PLACEMENT;  Surgeon: Gwenyth Ober, MD;  Location: [facility] ENDOSCOPY;  Service: General;  Laterality: N/A;    Family History  Problem Relation Age of Onset  . Heart attack Father   . Lung cancer Brother   . Healthy Sister     Social history:  reports that she quit smoking about 11 years ago. Her smoking use included Cigarettes. She smoked 0.00 packs per day. She has never used smokeless tobacco. She reports that she does not drink alcohol or use illicit drugs.    Allergies  Allergen Reactions  . Penicillins Swelling    Medications:  Prior to Admission medications   Medication Sig Start Date End Date Taking? Authorizing Provider  aspirin 81 MG tablet 81 mg by Gastric Tube route daily.    Yes Historical Provider, MD  atorvastatin (LIPITOR) 20 MG tablet 20 mg by Gastric Tube route every evening. 5pm 01/04/13  Yes Historical Provider, MD  feeding supplement, GLUCERNA SHAKE, (GLUCERNA SHAKE) LIQD Take 237 mLs by mouth 2 (two) times daily between meals. 06/04/14  Yes Allie Bossier, MD  ferrous sulfate 325 (65 FE) MG tablet 325 mg by Gastric Tube route daily.    Yes Historical Provider, MD  isosorbide mononitrate (IMDUR) 30 MG 24 hr tablet Take 1.5 tablets (45 mg total) by mouth daily. 06/05/14  Yes Allie Bossier, MD  losartan (COZAAR) 50 MG tablet Take 1 tablet (50 mg total) by mouth daily. 06/04/14  Yes Allie Bossier, MD  metoprolol succinate (TOPROL-XL) 25 MG  24 hr tablet Take 1 tablet (25 mg total) by mouth daily. 06/04/14  Yes Allie Bossier, MD  pramipexole (MIRAPEX) 0.5 MG tablet Take 0.5 mg by mouth daily.   Yes Historical Provider, MD    ROS:  Out of a complete 14 system review of symptoms, the patient complains only of the following symptoms, and all other reviewed systems are negative.  Cough Restless legs Walking difficulty  Blood pressure 188/78, pulse 74, height 5\' 2"  (1.575 m), weight 119 lb (53.978 kg).  Physical Exam  General: The patient is alert and cooperative at the time of the examination. The  patient has a flat affect.  Skin: No significant peripheral edema is noted.   Neurologic Exam  Mental status: The patient is alert and oriented x 3 at the time of the examination. The patient has apparent normal recent and remote memory, with an apparently normal attention span and concentration ability.   Cranial nerves: Facial symmetry is present. Speech is normal, no aphasia or dysarthria is noted. Extraocular movements are full. Visual fields are full.  Motor: The patient has good strength in all 4 extremities.  Sensory examination: Soft touch sensation is symmetric on the face, arms, and legs.  Coordination: The patient has good finger-nose-finger and heel-to-shin bilaterally.  Gait and station: The patient has a normal gait. The patient uses a cane for ambulation. Tandem gait is slightly unsteady. Romberg is negative. No drift is seen.  Reflexes: Deep tendon reflexes are symmetric.   Assessment/Plan:  1. Restless leg syndrome  2. Mild gait disturbance  3. Recent significant anemia  The patient will be given her persistent for the Mirapex. She will need further evaluation for her anemia. The patient is using a cane for ambulation, she is roughly stable while using this. She will follow-up in about one year, or sooner if needed.  Jill Alexanders MD 06/08/2014 8:16 PM  [facility] [address] [place], [place] [address]  Phone [phone] Fax [phone]

## 2014-09-05 ENCOUNTER — Encounter: Payer: Self-pay | Admitting: Oncology

## 2014-09-05 ENCOUNTER — Ambulatory Visit (INDEPENDENT_AMBULATORY_CARE_PROVIDER_SITE_OTHER): Payer: Medicare Other | Admitting: Oncology

## 2014-09-05 VITALS — BP 164/67 | HR 77 | Temp 98.4°F | Wt 132.4 lb

## 2014-09-05 DIAGNOSIS — R001 Bradycardia, unspecified: Secondary | ICD-10-CM

## 2014-09-05 DIAGNOSIS — D509 Iron deficiency anemia, unspecified: Secondary | ICD-10-CM

## 2014-09-05 LAB — RETICULOCYTES
ABS RETIC: 60.2 10*3/uL (ref 19.0–186.0)
RBC.: 4.3 MIL/uL (ref 3.87–5.11)
Retic Ct Pct: 1.4 % (ref 0.4–2.3)

## 2014-09-05 LAB — CBC WITH DIFFERENTIAL/PLATELET
BASOS PCT: 0 % (ref 0–1)
Basophils Absolute: 0 10*3/uL (ref 0.0–0.1)
EOS ABS: 0.3 10*3/uL (ref 0.0–0.7)
Eosinophils Relative: 4 % (ref 0–5)
HCT: 37.6 % (ref 36.0–46.0)
HEMOGLOBIN: 12 g/dL (ref 12.0–15.0)
Lymphocytes Relative: 24 % (ref 12–46)
Lymphs Abs: 1.8 10*3/uL (ref 0.7–4.0)
MCH: 27.9 pg (ref 26.0–34.0)
MCHC: 31.9 g/dL (ref 30.0–36.0)
MCV: 87.4 fL (ref 78.0–100.0)
MONO ABS: 0.5 10*3/uL (ref 0.1–1.0)
Monocytes Relative: 7 % (ref 3–12)
NEUTROS PCT: 65 % (ref 43–77)
Neutro Abs: 4.9 10*3/uL (ref 1.7–7.7)
PLATELETS: 205 10*3/uL (ref 150–400)
RBC: 4.3 MIL/uL (ref 3.87–5.11)
RDW: 16.7 % — ABNORMAL HIGH (ref 11.5–15.5)
WBC: 7.6 10*3/uL (ref 4.0–10.5)

## 2014-09-05 LAB — SAVE SMEAR

## 2014-09-05 LAB — IRON AND TIBC
%SAT: 36 % (ref 20–55)
Iron: 123 ug/dL (ref 42–145)
TIBC: 338 ug/dL (ref 250–470)
UIBC: 215 ug/dL (ref 125–400)

## 2014-09-05 LAB — LACTATE DEHYDROGENASE: LDH: 184 U/L (ref 94–250)

## 2014-09-05 NOTE — Patient Instructions (Signed)
To lab today Someone will call you with results and let you know if we need to do additional tests or give you a dose of iron in your vein Return visit with Dr Beryle Beams in August

## 2014-09-06 LAB — FERRITIN: Ferritin: 27 ng/mL (ref 10–291)

## 2014-09-06 NOTE — Progress Notes (Signed)
Patient ID: Kristi Alexander, female   DOB: [birth year], 81 y.o.   MRN: [id number] New Patient Hematology   Kristi Alexander [id number] 28-Jul-1925 81 y.o. 09/06/2014  CC: Dr [name]; Charolette Forward; Wilford Corner   Reason for referral: Evaulate Chronic Microcytic Anemia   HPI:  Pleasant ar 41 old woman billed as having dementia but who I find quite mentally sharp. She was admitted to the hospital 03/22/2013 with sepsis, small bowel obstruction with small bowel infarction. She had a stormy course. She required emergency laparotomy and resection of small bowel. She developed acute renal failure and hyperosmolar nonketotic diabetes. She required prolonged ventilatory support and placement of both a tracheostomy tube and a gastrostomy feeding tube. Not surprisingly she was anemic with hemoglobins as low as 6.7. She is made a quite remarkable recovery but presented to the hospital again on 06/01/2014 with bradycardia, malnutrition, diarrhea and severe anemia with hemoglobin 5.3. MCV 70.  Weight down 216 pounds. Echocardiogram done during that admission showed ejection fraction 50-55 percent with grade 1 diastolic dysfunction. At present, appetite is good, she is scanning weight, both tracheostomy and gastrostomy tubes have been removed. At time of hospital discharge hemoglobin was 8.6, hematocrit 28, MCV 73, white count 5200, limits 213,000.  She reports bowel movements now back to normal. She denies any hematochezia or melena. No hematuria. No vaginal bleeding. Previous hysterectomy. She does not remember if she had her ovaries removed. She's developed a dry cough which she says began a year ago. Dyspnea during coughing spells. No PND or orthopnea. No dysphagia. No abdominal pain. She has chronic degenerative arthritis affecting large joints. No known inflammatory arthritis. No signs or symptoms of a collagen vascular disorder. TSH done in April normal at 1.87. Chemistry profile with BUN 18,  creatinine 1.0, albumen 2.9, total protein 5.9, transaminases and bilirubin normal. Chest x-ray normal except for cardiomegaly. CT abdomen pelvis done at time of laparotomy last year showed normal liver spleen pancreas and no lymphadenopathy.  She is followed by [facility] gastroenterology. She denied any history of ulcers or reflux. She cannot remember when she had her last colonoscopy. Only thing I can find in the chart is from April 2003.  She denies any history of MI, thyroid disease, hepatitis, yellow jaundice, tuberculosis, seizure, or stroke.  There is no family history of any bleeding disorder.  PMH: Past Medical History  Diagnosis Date  . Restless leg syndrome   . Hypertension   . Dyslipidemia   . History of appendectomy   . H/O: hysterectomy   . Cyst of breast, right, benign solitary   . Inguinal hernia   . History of lumbosacral spine surgery   . Arthritis   . HHNC (hyperglycemic hyperosmolar nonketotic coma) 05/14/2013    Past Surgical History  Procedure Laterality Date  . Appendectomy    . Abdominal hysterectomy    . Laparotomy N/A 03/23/2013    Procedure: EXPLORATORY LAPAROTOMY;  Surgeon: Gwenyth Ober, MD;  Location: [place];  Service: General;  Laterality: N/A;  . Bowel resection N/A 03/23/2013    Procedure: SMALL BOWEL RESECTION;  Surgeon: Gwenyth Ober, MD;  Location: [place];  Service: General;  Laterality: N/A;  . Tracheostomy      feinstein  . Esophagogastroduodenoscopy N/A 04/01/2013    Procedure: ESOPHAGOGASTRODUODENOSCOPY (EGD);  Surgeon: Gwenyth Ober, MD;  Location: [place];  Service: General;  Laterality: N/A;  . Peg placement N/A 04/01/2013    Procedure: PERCUTANEOUS ENDOSCOPIC GASTROSTOMY (PEG) PLACEMENT;  Surgeon:  Gwenyth Ober, MD;  Location: [place];  Service: General;  Laterality: N/A;    Allergies: Allergies  Allergen Reactions  . Penicillins Swelling    Medications:  Current outpatient prescriptions:  .  aspirin 81 MG tablet, 81 mg by  Gastric Tube route daily. , Disp: , Rfl:  .  atorvastatin (LIPITOR) 20 MG tablet, 20 mg by Gastric Tube route every evening. 5pm, Disp: , Rfl:  .  feeding supplement, GLUCERNA SHAKE, (GLUCERNA SHAKE) LIQD, Take 237 mLs by mouth 2 (two) times daily between meals., Disp: 90 Can, Rfl: 0 .  isosorbide mononitrate (IMDUR) 30 MG 24 hr tablet, Take 1.5 tablets (45 mg total) by mouth daily., Disp: 60 tablet, Rfl: 0 .  losartan (COZAAR) 50 MG tablet, Take 1 tablet (50 mg total) by mouth daily., Disp: 30 tablet, Rfl: 0 .  metoprolol succinate (TOPROL-XL) 25 MG 24 hr tablet, Take 1 tablet (25 mg total) by mouth daily., Disp: 30 tablet, Rfl: 0 .  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 She had 2 children a son who died of stomach cancer at age 49. I was his oncologist. She has a daughter who accompanies her today who is alive and healthy at age 35. She just lost her husband this January he was 80 years old. she quit smoking about 11 years ago.  she does not drink alcohol or use illicit drugs.  Family History: Family History  Problem Relation Age of Onset  . Heart attack Father   . Lung cancer Brother   . Healthy Sister    a brother died of lung cancer. A sister is alive and well at aged 24. She has peripheral vascular disease and has required stents.  Review of Systems: See HPI. No paresthesias. Remaining ROS negative.  Physical Exam: Blood pressure 164/67, pulse 77, temperature 98.4 F (36.9 C), temperature source Oral, weight 132 lb 6.4 oz (60.056 kg), SpO2 99 %. Wt Readings from Last 3 Encounters:  09/05/14 132 lb 6.4 oz (60.056 kg)  06/08/14 119 lb (53.978 kg)  06/01/14 123 lb (55.792 kg)     General appearance: Pleasant, well-nourished, African-American woman HENNT: Pharynx no erythema, exudate, mass, or ulcer. No thyromegaly  or thyroid nodules Lymph nodes: No cervical, supraclavicular, or axillary lymphadenopathy Breasts:  Lungs: Clear to auscultation, resonant to percussion throughout Heart: Regular rhythm, no murmur, no gallop, no rub, no click, no edema Abdomen: Soft, nontender, normal bowel sounds, no mass, no organomegaly Extremities: No edema, no calf tenderness Musculoskeletal: no joint deformities GU:  Vascular: Carotid pulses 2+, no bruits, distal pulses: Dorsalis pedis absent but symmetric Neurologic: Alert, oriented, PERRLA, optic discs sharp and vessels normal, no hemorrhage or exudate, cranial nerves grossly normal, motor strength 5 over 5, reflexes 1+ symmetric at the biceps, absent but symmetric at the knees,, upper body coordination normal, gait normal, sensation intact to vibration over the finger test by tuning fork exam Skin: No rash or ecchymosis    Lab Results: Lab Results  Component Value Date   WBC 7.6 09/05/2014   HGB 12.0 09/05/2014   HCT 37.6 09/05/2014   MCV 87.4 09/05/2014   PLT 205 09/05/2014     Chemistry      Component Value Date/Time   NA 140 06/04/2014 0350   K 3.6 06/04/2014 0350   CL 107 06/04/2014  0350   CO2 26 06/04/2014 0350   BUN 18 06/04/2014 0350   CREATININE 1.01 06/04/2014 0350      Component Value Date/Time   CALCIUM 8.9 06/04/2014 0350   ALKPHOS 89 06/04/2014 0350   AST 23 06/04/2014 0350   ALT 32 06/04/2014 0350   BILITOT 1.0 06/04/2014 0350    Iron 123, TIBC 338, ferritin 27 LDH 184; reticulocyte count: 1.4%  Review of peripheral blood film:  Normochromic normocytic red cells. No spherocytes, schistocytes, or polychromasia. Mature white cells. Normal platelets.   Radiological Studies: Chest x-ray normal April 2016 except for borderline cardiomegaly    Impression and Plan: Iron deficiency anemia secondary to acute illness and sequelae with likely some component of malabsorption due to small bowel resection.   Since recent blood  transfusion given in April and on oral iron replacement, her hemoglobin is now 12 g, MCV 87.4. We will encourage her to continue her oral iron. I do not think any further evaluation is necessary at this time. It would be good to document when she had her last colonoscopy but her dramatic clinical improvement over the last few months and significant rise in her hemoglobin  argues against underlying malignancy.     Annia Belt, MD 09/06/2014, 7:32 AM

## 2014-09-07 ENCOUNTER — Telehealth: Payer: Self-pay | Admitting: *Deleted

## 2014-09-07 NOTE — Telephone Encounter (Signed)
Pt called / Informed her blood looks good, stay on iron pills and no additional testing is needed per Dr Beryle Beams. Pt voiced no questions.

## 2014-09-07 NOTE — Telephone Encounter (Signed)
-----   Message from Annia Belt, MD sent at 09/06/2014  1:50 PM EDT ----- Call pt: blood work good; stay on iron pills; no additional testing needed

## 2014-10-24 ENCOUNTER — Encounter: Payer: Self-pay | Admitting: Oncology

## 2014-10-24 ENCOUNTER — Ambulatory Visit (INDEPENDENT_AMBULATORY_CARE_PROVIDER_SITE_OTHER): Payer: Medicare Other | Admitting: Oncology

## 2014-10-24 VITALS — BP 174/74 | HR 76 | Temp 98.0°F | Wt 139.5 lb

## 2014-10-24 DIAGNOSIS — R001 Bradycardia, unspecified: Secondary | ICD-10-CM

## 2014-10-24 DIAGNOSIS — D509 Iron deficiency anemia, unspecified: Secondary | ICD-10-CM

## 2014-10-24 NOTE — Patient Instructions (Signed)
To lab today: we will call with results Stay on iron pills Return to blood specialist only as needed

## 2014-10-24 NOTE — Progress Notes (Signed)
Patient ID: Kristi Alexander, female   DOB: Dec 08, 1925, 81 y.o.   MRN: [id number] Hematology and Oncology Follow Up Visit  Kristi Alexander [id number] [birth year] 81 y.o. 10/24/2014 2:59 PM   Principle Diagnosis: Encounter Diagnoses  Name Primary?  . Symptomatic bradycardia Yes  . Anemia, iron deficiency      Interim History:  81 year old woman I evaluated last month for anemia. Please see office consult note dated 09/06/2014 for full details of her medical history. She was just recovering for a complicated life-threatening illness. She was admitted in January 2015 with sepsis, small bowel obstruction with small bowel infarction. She required emergency laparotomy and resection of small bowel. She developed acute renal failure. She required prolonged ventilatory support and a tracheostomy and gastrostomy feeding tube. Hemoglobin fell as low as 6.7. She was readmitted in April 2016 with symptomatic bradycardia, malnutrition, diarrhea, and progressive anemia with hemoglobin 5.3, MCV 70. At the time that I saw her in July, she was clearly in a recovery phase. Out of the hospital. Doing well. Eating and gaining weight. Renal function had normalized. Hemoglobin already coming up to 12 g with MCV 87. She was on oral iron replacement which I encouraged her to continue. She continues to do well at this time. No new complaints today. She denies any hematochezia, melena, hematuria. Appetite remains good and she continues to gain weight. In fact she has gained 16-1/2 pounds compare with her weight in April 2016 when she was readmitted to the hospital. Weight than 123 pounds with subsequent fall to 119. Current weight 139.5 pounds.  Medications: reviewed  Allergies:  Allergies  Allergen Reactions  . Penicillins Swelling    Review of Systems: See history of present illness Remaining ROS negative:   Physical Exam: Blood pressure 174/74, pulse 76, temperature 98 F (36.7 C), temperature source Oral,  weight 139 lb 8 oz (63.277 kg), SpO2 99 %. Wt Readings from Last 3 Encounters:  10/24/14 139 lb 8 oz (63.277 kg)  09/05/14 132 lb 6.4 oz (60.056 kg)  06/08/14 119 lb (53.978 kg)     General appearance: Well-nourished African-American woman HENNT:  Lymph nodes: No cervical, supraclavicular, or axillary lymphadenopathy Breasts: Lungs: Clear to auscultation, resonant to percussion throughout Heart: Regular rhythm, no murmur, no gallop, no rub, no click, no edema Abdomen: Soft, nontender, normal bowel sounds, no mass, no organomegaly Extremities: No edema, no calf tenderness Musculoskeletal: no joint deformities GU:  Vascular: Carotid pulses 2+, no bruits, Neurologic: Alert, oriented, PERRLA, , cranial nerves grossly normal, motor strength 5 over 5, reflexes 1+ symmetric, upper body coordination normal, gait normal, Skin: No rash or ecchymosis  Lab Results: CBC W/Diff    Component Value Date/Time   WBC 7.6 09/05/2014 1458   RBC 4.30 09/05/2014 1458   RBC 4.30 09/05/2014 1458   HGB 12.0 09/05/2014 1458   HCT 37.6 09/05/2014 1458   PLT 205 09/05/2014 1458   MCV 87.4 09/05/2014 1458   MCH 27.9 09/05/2014 1458   MCHC 31.9 09/05/2014 1458   RDW 16.7* 09/05/2014 1458   LYMPHSABS 1.8 09/05/2014 1458   MONOABS 0.5 09/05/2014 1458   EOSABS 0.3 09/05/2014 1458   BASOSABS 0.0 09/05/2014 1458     Chemistry      Component Value Date/Time   NA 140 06/04/2014 0350   K 3.6 06/04/2014 0350   CL 107 06/04/2014 0350   CO2 26 06/04/2014 0350   BUN 18 06/04/2014 0350   CREATININE 1.01 06/04/2014 0350  Component Value Date/Time   CALCIUM 8.9 06/04/2014 0350   ALKPHOS 89 06/04/2014 0350   AST 23 06/04/2014 0350   ALT 32 06/04/2014 0350   BILITOT 1.0 06/04/2014 0350    Hemoglobin 11.8  MCV 89;  10/24/14 Iron 123 Ferritin 27   Radiological Studies: No results found.  Impression:  Anemia of acute illness Near complete resolution with clinical recovery, iron replacement, and  improved nutrition. No further evaluation planned at this time. She is encouraged to continue iron & vitamin supplements.   CC: Patient Care Team: [name], MD as PCP - General (Family Medicine)   Annia Belt, MD 8/30/20162:59 PM

## 2014-10-25 ENCOUNTER — Telehealth: Payer: Self-pay | Admitting: *Deleted

## 2014-10-25 LAB — CBC WITH DIFFERENTIAL/PLATELET
BASOS ABS: 0 10*3/uL (ref 0.0–0.2)
Basos: 1 %
EOS (ABSOLUTE): 0.3 10*3/uL (ref 0.0–0.4)
Eos: 4 %
HEMATOCRIT: 37.3 % (ref 34.0–46.6)
Hemoglobin: 11.8 g/dL (ref 11.1–15.9)
Immature Grans (Abs): 0 10*3/uL (ref 0.0–0.1)
Immature Granulocytes: 0 %
LYMPHS ABS: 1.8 10*3/uL (ref 0.7–3.1)
Lymphs: 31 %
MCH: 28.2 pg (ref 26.6–33.0)
MCHC: 31.6 g/dL (ref 31.5–35.7)
MCV: 89 fL (ref 79–97)
MONOS ABS: 0.4 10*3/uL (ref 0.1–0.9)
Monocytes: 8 %
Neutrophils Absolute: 3.3 10*3/uL (ref 1.4–7.0)
Neutrophils: 56 %
Platelets: 224 10*3/uL (ref 150–379)
RBC: 4.18 x10E6/uL (ref 3.77–5.28)
RDW: 15.3 % (ref 12.3–15.4)
WBC: 5.9 10*3/uL (ref 3.4–10.8)

## 2014-10-25 LAB — FERRITIN: Ferritin: 24 ng/mL (ref 15–150)

## 2014-10-25 NOTE — Telephone Encounter (Signed)
-----   Message from Annia Belt, MD sent at 10/25/2014  8:12 AM EDT ----- Call pt: blood count stable compared with last month. Stay on iron pills.

## 2014-10-25 NOTE — Telephone Encounter (Signed)
Pt called / informed blood counts are stable as compared with last month and to stay on her iron tablets per Dr Beryle Beams. Pt voiced understanding.

## 2014-11-06 ENCOUNTER — Telehealth: Payer: Self-pay | Admitting: Neurology

## 2014-11-06 NOTE — Telephone Encounter (Signed)
Patient called regarding issues she's having with legs and feet. She was diagnosed with Restless Leg Syndrome and she has tingling that comes and goes. Feels a draining and pulling in her legs. "Something is pulling me from my feet to my shoulders. It's aggravating. It takes me half a day to get going good".

## 2014-11-06 NOTE — Telephone Encounter (Signed)
I tried to call the patient. No answer and no voicemail. I will try again.

## 2014-11-07 NOTE — Telephone Encounter (Signed)
I called the patient. She c/o her legs feeling tight and heavy. It feels like "something is drawing them up." She states they do not hurt and there is no tingling. This has been going on for approximately 1 month. She also c/o feeling off balance.

## 2014-11-07 NOTE — Telephone Encounter (Signed)
I called the patient. She indicates that she is having some stiffness sensation in her legs in the morning, she finds it difficult to get going, she feels that her balance is off of it. She has not had any falls. This is different from her restless leg syndrome. I will try to get her worked in the next several weeks to take a look at what is going on. She has had these new symptoms for about one month.

## 2014-11-08 NOTE — Telephone Encounter (Signed)
I called the patient. Appointment scheduled 9/28.

## 2014-11-22 ENCOUNTER — Encounter: Payer: Self-pay | Admitting: Neurology

## 2014-11-22 ENCOUNTER — Ambulatory Visit (INDEPENDENT_AMBULATORY_CARE_PROVIDER_SITE_OTHER): Payer: Medicare Other | Admitting: Neurology

## 2014-11-22 VITALS — BP 173/78 | HR 63 | Ht 61.0 in | Wt 141.0 lb

## 2014-11-22 DIAGNOSIS — R269 Unspecified abnormalities of gait and mobility: Secondary | ICD-10-CM

## 2014-11-22 DIAGNOSIS — E538 Deficiency of other specified B group vitamins: Secondary | ICD-10-CM

## 2014-11-22 DIAGNOSIS — G2581 Restless legs syndrome: Secondary | ICD-10-CM

## 2014-11-22 HISTORY — DX: Unspecified abnormalities of gait and mobility: R26.9

## 2014-11-22 NOTE — Patient Instructions (Signed)

## 2014-11-22 NOTE — Progress Notes (Signed)
Reason for visit: Gait disorder  Kristi Alexander is an 81 y.o. female  History of present illness:  Kristi Alexander is an 81 year old right-handed black female with a history of restless leg syndrome. The patient had significant anemia previously, this has been corrected, and the patient indicates that the restless leg symptoms have improved. Within the last 4 weeks or so, she has noted some sensation of stiffness in the legs that is worse in the morning, better as the day goes on. She feels as if her balance has been altered, she has not had any falls, but she uses a quad cane for ambulation. She reports no true muscle cramps or discomfort in the legs, she denies any significant neck or back pain or difficulty controlling the bowels or the bladder. She indicates that when she raises one arm or the other, she may feel a pulling sensation in the leg. She is brought into the office today for further evaluation.  Past Medical History  Diagnosis Date  . Restless leg syndrome   . Hypertension   . Dyslipidemia   . History of appendectomy   . H/O: hysterectomy   . Cyst of breast, right, benign solitary   . Inguinal hernia   . History of lumbosacral spine surgery   . Arthritis   . HHNC (hyperglycemic hyperosmolar nonketotic coma) 05/14/2013  . Abnormality of gait 11/22/2014    Past Surgical History  Procedure Laterality Date  . Appendectomy    . Abdominal hysterectomy    . Laparotomy N/A 03/23/2013    Procedure: EXPLORATORY LAPAROTOMY;  Surgeon: Gwenyth Ober, MD;  Location: [place];  Service: General;  Laterality: N/A;  . Bowel resection N/A 03/23/2013    Procedure: SMALL BOWEL RESECTION;  Surgeon: Gwenyth Ober, MD;  Location: [place];  Service: General;  Laterality: N/A;  . Tracheostomy      feinstein  . Esophagogastroduodenoscopy N/A 04/01/2013    Procedure: ESOPHAGOGASTRODUODENOSCOPY (EGD);  Surgeon: Gwenyth Ober, MD;  Location: [place];  Service: General;  Laterality: N/A;  . Peg  placement N/A 04/01/2013    Procedure: PERCUTANEOUS ENDOSCOPIC GASTROSTOMY (PEG) PLACEMENT;  Surgeon: Gwenyth Ober, MD;  Location: [facility];  Service: General;  Laterality: N/A;    Family History  Problem Relation Age of Onset  . Heart attack Father   . Lung cancer Brother   . Healthy Sister     Social history:  reports that she quit smoking about 11 years ago. Her smoking use included Cigarettes. She smoked 0.00 packs per day. She has never used smokeless tobacco. She reports that she does not drink alcohol or use illicit drugs.    Allergies  Allergen Reactions  . Penicillins Swelling    Medications:  Prior to Admission medications   Medication Sig Start Date End Date Taking? Authorizing Provider  amLODipine (NORVASC) 5 MG tablet Take 5 mg by mouth daily. 11/12/14  Yes Historical Provider, MD  aspirin 81 MG tablet 81 mg by Gastric Tube route daily.    Yes Historical Provider, MD  atorvastatin (LIPITOR) 20 MG tablet 20 mg by Gastric Tube route every evening. 5pm 01/04/13  Yes Historical Provider, MD  feeding supplement, GLUCERNA SHAKE, (GLUCERNA SHAKE) LIQD Take 237 mLs by mouth 2 (two) times daily between meals. 06/04/14  Yes Allie Bossier, MD  ferrous sulfate 325 (65 FE) MG tablet Take 325 mg by mouth 2 (two) times daily with a meal.   Yes Historical Provider, MD  hydrOXYzine (  ATARAX/VISTARIL) 25 MG tablet Take 25 mg by mouth daily as needed.   Yes Historical Provider, MD  isosorbide mononitrate (IMDUR) 30 MG 24 hr tablet Take 1.5 tablets (45 mg total) by mouth daily. 06/05/14  Yes Allie Bossier, MD  losartan (COZAAR) 50 MG tablet Take 1 tablet (50 mg total) by mouth daily. 06/04/14  Yes Allie Bossier, MD  metoprolol succinate (TOPROL-XL) 25 MG 24 hr tablet Take 1 tablet (25 mg total) by mouth daily. 06/04/14  Yes Allie Bossier, MD  pramipexole (MIRAPEX) 0.5 MG tablet Take 1 tablet (0.5 mg total) by mouth daily. 06/08/14  Yes Kathrynn Ducking, MD    ROS:  Out of a complete 14  system review of symptoms, the patient complains only of the following symptoms, and all other reviewed systems are negative.  Cough Restless legs Walking difficulty  Blood pressure 173/78, pulse 63, height '5\' 1"'$  (1.549 m), weight 141 lb (63.957 kg).  Physical Exam  General: The patient is alert and cooperative at the time of the examination.  Skin: No significant peripheral edema is noted.   Neurologic Exam  Mental status: The patient is alert and oriented x 3 at the time of the examination. The patient has apparent normal recent and remote memory, with an apparently normal attention span and concentration ability.   Cranial nerves: Facial symmetry is present. Speech is normal, no aphasia or dysarthria is noted. Extraocular movements are full. Visual fields are full.  Motor: The patient has good strength in all 4 extremities.  Sensory examination: Soft touch sensation is symmetric on the face, arms, and legs. There appears to be some decreased pinprick sensation in the left leg in the distal third, not present on the right.  Coordination: The patient has good finger-nose-finger and heel-to-shin bilaterally.  Gait and station: The patient has a minimally wide-based gait, the patient uses a quad cane for ambulation. Tandem gait was not attempted. Romberg is negative. No drift is seen.  Reflexes: Deep tendon reflexes are symmetric, but are somewhat depressed.   Assessment/Plan:  1. History of restless leg syndrome  2. Reported leg stiffness, gait alteration  The patient reports a change in her ability to ambulate over the last several weeks. The patient will be sent for blood work today, she will have nerve conduction studies of both legs and one arm, and EMG of the left leg. She will follow-up for the EMG evaluation.  Jill Alexanders MD 11/22/2014 7:32 PM  [facility] [address] [place], [place] [address]  Phone [phone] Fax  [phone]

## 2014-11-23 LAB — CK: CK TOTAL: 93 U/L (ref 24–173)

## 2014-11-23 LAB — ANA W/REFLEX: ANA: NEGATIVE

## 2014-11-23 LAB — ANGIOTENSIN CONVERTING ENZYME: Angio Convert Enzyme: 42 U/L (ref 14–82)

## 2014-11-23 LAB — COPPER, SERUM: COPPER: 115 ug/dL (ref 72–166)

## 2014-11-23 LAB — RPR: RPR Ser Ql: NONREACTIVE

## 2014-11-23 LAB — VITAMIN B12: Vitamin B-12: 800 pg/mL (ref 211–946)

## 2014-11-23 LAB — SEDIMENTATION RATE: SED RATE: 13 mm/h (ref 0–40)

## 2014-11-24 ENCOUNTER — Telehealth: Payer: Self-pay

## 2014-11-24 NOTE — Telephone Encounter (Signed)
-----   Message from Kathrynn Ducking, MD sent at 11/23/2014  4:48 PM EDT -----  The blood work results are unremarkable. Please call the patient.  ----- Message -----    From: [facility] Lab Results In Interface    Sent: 11/23/2014   7:44 AM      To: Kathrynn Ducking, MD

## 2014-11-24 NOTE — Telephone Encounter (Signed)
I called the patient and relayed results. 

## 2014-12-05 ENCOUNTER — Encounter: Payer: Self-pay | Admitting: Internal Medicine

## 2014-12-05 ENCOUNTER — Ambulatory Visit (INDEPENDENT_AMBULATORY_CARE_PROVIDER_SITE_OTHER): Payer: Medicare Other | Admitting: Internal Medicine

## 2014-12-05 VITALS — BP 177/81 | HR 70 | Temp 98.2°F | Ht 61.0 in | Wt 143.0 lb

## 2014-12-05 DIAGNOSIS — E440 Moderate protein-calorie malnutrition: Secondary | ICD-10-CM | POA: Diagnosis not present

## 2014-12-05 DIAGNOSIS — D518 Other vitamin B12 deficiency anemias: Secondary | ICD-10-CM

## 2014-12-05 DIAGNOSIS — Z23 Encounter for immunization: Secondary | ICD-10-CM

## 2014-12-05 DIAGNOSIS — R739 Hyperglycemia, unspecified: Secondary | ICD-10-CM | POA: Diagnosis not present

## 2014-12-05 DIAGNOSIS — R05 Cough: Secondary | ICD-10-CM | POA: Diagnosis not present

## 2014-12-05 DIAGNOSIS — R269 Unspecified abnormalities of gait and mobility: Secondary | ICD-10-CM

## 2014-12-05 DIAGNOSIS — I10 Essential (primary) hypertension: Secondary | ICD-10-CM

## 2014-12-05 DIAGNOSIS — Z0000 Encounter for general adult medical examination without abnormal findings: Secondary | ICD-10-CM

## 2014-12-05 DIAGNOSIS — Z6827 Body mass index (BMI) 27.0-27.9, adult: Secondary | ICD-10-CM

## 2014-12-05 DIAGNOSIS — R053 Chronic cough: Secondary | ICD-10-CM

## 2014-12-05 LAB — POCT GLYCOSYLATED HEMOGLOBIN (HGB A1C): Hemoglobin A1C: 6.7

## 2014-12-05 LAB — GLUCOSE, CAPILLARY: Glucose-Capillary: 110 mg/dL — ABNORMAL HIGH (ref 65–99)

## 2014-12-05 NOTE — Patient Instructions (Signed)
Thank you for coming in today  - We checked some blood work today. I will call you if anything comes back abnormal.  - We will work on getting records from Dr. Alyson Ingles - I would like to see you in 1 month

## 2014-12-05 NOTE — Progress Notes (Signed)
Patient ID: Kristi Alexander, female   DOB: February 09, 1926, 81 y.o.   MRN: [id number]   Subjective:   Patient ID: Kristi Alexander female   DOB: May 11, 1925 81 y.o.   MRN: [id number]  HPI: Kristi Alexander is a 81 y.o. female with a past medical history listed below presents today to establish care. She was previously seen by Dr. Doroteo Bradford.   For further details of today's visit and the status of the patient's chronic medical issues please refer to the assessment and plan.  Past Medical History  Diagnosis Date  . Restless leg syndrome   . Hypertension   . Dyslipidemia   . History of appendectomy   . H/O: hysterectomy   . Cyst of breast, right, benign solitary   . Inguinal hernia   . History of lumbosacral spine surgery   . Arthritis   . HHNC (hyperglycemic hyperosmolar nonketotic coma) ([place]) 05/14/2013  . Abnormality of gait 11/22/2014   Current Outpatient Prescriptions  Medication Sig Dispense Refill  . amLODipine (NORVASC) 5 MG tablet Take 5 mg by mouth daily.    Marland Kitchen aspirin 81 MG tablet 81 mg by Gastric Tube route daily.     Marland Kitchen atorvastatin (LIPITOR) 20 MG tablet 20 mg by Gastric Tube route every evening. 5pm    . feeding supplement, GLUCERNA SHAKE, (GLUCERNA SHAKE) LIQD Take 237 mLs by mouth 2 (two) times daily between meals. 90 Can 0  . ferrous sulfate 325 (65 FE) MG tablet Take 325 mg by mouth 2 (two) times daily with a meal.    . hydrOXYzine (ATARAX/VISTARIL) 25 MG tablet Take 25 mg by mouth daily as needed.    . isosorbide mononitrate (IMDUR) 30 MG 24 hr tablet Take 1.5 tablets (45 mg total) by mouth daily. 60 tablet 0  . losartan (COZAAR) 50 MG tablet Take 1 tablet (50 mg total) by mouth daily. 30 tablet 0  . metoprolol succinate (TOPROL-XL) 25 MG 24 hr tablet Take 1 tablet (25 mg total) by mouth daily. 30 tablet 0  . pramipexole (MIRAPEX) 0.5 MG tablet Take 1 tablet (0.5 mg total) by mouth daily. 90 tablet 1   No current facility-administered medications for this visit.   Family  History  Problem Relation Age of Onset  . Heart attack Father   . Lung cancer Brother   . Healthy Sister    Social History   Social History  . Marital Status: Married    Spouse Name: [name]   . Number of Children: 2  . Years of Education: 12+   Occupational History  . retired    Social History Main Topics  . Smoking status: Former Smoker -- 0.00 packs/day    Types: Cigarettes    Quit date: 02/25/2003  . Smokeless tobacco: Never Used  . Alcohol Use: No     Comment: socailly.  . Drug Use: No  . Sexual Activity: Not Asked   Other Topics Concern  . None   Social History Narrative   Patient is married and lives with her husband. The patient is retired. Patient has 2 children and has a college education.    Patient is right handed.   Patient drinks very little caffeine.   Review of Systems: Review of Systems  Constitutional: Negative for fever and chills.  Respiratory: Positive for cough. Negative for shortness of breath.   Cardiovascular: Negative for chest pain.  Gastrointestinal: Negative for nausea, vomiting, abdominal pain and diarrhea.  Genitourinary: Negative for dysuria.  Musculoskeletal: Negative for myalgias  and falls.  Neurological: Negative for weakness and headaches.  Psychiatric/Behavioral: Negative for depression.   Objective:  Physical Exam: Filed Vitals:   12/05/14 1602  BP: 177/81  Pulse: 70  Temp: 98.2 F (36.8 C)  TempSrc: Oral  Height: '5\' 1"'$  (1.549 m)  Weight: 143 lb (64.864 kg)  SpO2: 100%   PHYSICAL EXAM General appearance: Well-nourished African-American woman HENNT:  Lymph nodes: No cervical, supraclavicular, or axillary lymphadenopathy Lungs: Clear to auscultation, resonant to percussion throughout Heart: Regular rhythm, no murmur, no gallop, no rub, no click, no edema Abdomen: Soft, nontender, normal bowel sounds, no mass, no organomegaly Extremities: No edema, no calf tenderness Musculoskeletal: no joint deformities Vascular:  Carotid pulses 2+, no bruits, Neurologic: Alert, oriented, PERRLA, , cranial nerves grossly normal Skin: No rash or ecchymosis  Assessment & Plan:   Case discussed with Dr. Lynnae January. Please refer to Problem based carting for further details of today's visit.

## 2014-12-06 LAB — VITAMIN B12: Vitamin B-12: 720 pg/mL (ref 211–946)

## 2014-12-07 LAB — BASIC METABOLIC PANEL
BUN/Creatinine Ratio: 25 (ref 11–26)
BUN: 23 mg/dL (ref 8–27)
CO2: 22 mmol/L (ref 18–29)
CREATININE: 0.92 mg/dL (ref 0.57–1.00)
Calcium: 9.6 mg/dL (ref 8.7–10.3)
Chloride: 104 mmol/L (ref 97–108)
GFR calc Af Amer: 65 mL/min/{1.73_m2} (ref >59)
GFR, EST NON AFRICAN AMERICAN: 56 mL/min/{1.73_m2} — AB (ref >59)
Glucose: 115 mg/dL — ABNORMAL HIGH (ref 65–99)
Potassium: 4.5 mmol/L (ref 3.5–5.2)
SODIUM: 142 mmol/L (ref 134–144)

## 2014-12-11 DIAGNOSIS — Z0000 Encounter for general adult medical examination without abnormal findings: Secondary | ICD-10-CM | POA: Insufficient documentation

## 2014-12-11 DIAGNOSIS — I10 Essential (primary) hypertension: Secondary | ICD-10-CM | POA: Insufficient documentation

## 2014-12-11 NOTE — Assessment & Plan Note (Signed)
Patient reprots her weight is improving on nutritional supplements. 119 lb in March to 143 today. Will need to monitor B12 given bowel resection (within normal range today).

## 2014-12-11 NOTE — Assessment & Plan Note (Signed)
Patient's HgbA1c was 6.7 today. Has a history of elevated blood glucoses in the past but unclear if they were fasting or random with none about 200. Will continue to monitor in the future with repeat testing before giving diagnosis of diabetes.

## 2014-12-11 NOTE — Assessment & Plan Note (Signed)
Patient reports chronic nonproductive cough since she had to be intubated 2 years ago. Likely 2/2 scar tissue. She denies any history of asthma or GERD. Denies any GERD symptoms. Cough is worse at night but not associated with lying flat. Patient does have a past history of smoking (unable to recall years but likely 10+ pack year history, quit over a decade ago).  -Will get PFTs to assess scar tissue

## 2014-12-11 NOTE — Assessment & Plan Note (Signed)
Patient received flu vaccine today. 

## 2014-12-11 NOTE — Assessment & Plan Note (Signed)
BP Readings from Last 3 Encounters:  12/05/14 177/81  11/22/14 173/78  10/24/14 174/74    Lab Results  Component Value Date   NA 142 12/05/2014   K 4.5 12/05/2014   CREATININE 0.92 12/05/2014    Assessment: Blood pressure control:  poor Progress toward BP goal:    Comments: Patient reports that when she checks her blood pressure at home it runs 449-201E systolic. BP was 177/81 today and 171/62 on recheck. She is currently on amlodipine 5 mg daily, imdur 30 mg daily, losartan 50 mg daily and metoprolol 25 mg daily.   Plan: Medications:  continue current medications Educational resources provided:   Self management tools provided:   Other plans: Will continue her current medications today. BMET today with no electrolyte abnormalities, GFR 65. Will follow up in one month. Likely need to maximize current medications vs starting HCTZ in [place]. Obtaining records from previous PCP.

## 2014-12-11 NOTE — Assessment & Plan Note (Signed)
Patient's B12 was 760 today. Will need to continue to monitor given patient's history of bowel resection. Oral repletion is sufficient.

## 2014-12-11 NOTE — Assessment & Plan Note (Signed)
Patient reports not falls in the past year. Uses cane occasionally.

## 2014-12-14 ENCOUNTER — Ambulatory Visit (INDEPENDENT_AMBULATORY_CARE_PROVIDER_SITE_OTHER): Payer: Medicare Other | Admitting: Neurology

## 2014-12-14 ENCOUNTER — Ambulatory Visit (INDEPENDENT_AMBULATORY_CARE_PROVIDER_SITE_OTHER): Payer: Self-pay | Admitting: Neurology

## 2014-12-14 ENCOUNTER — Encounter: Payer: Self-pay | Admitting: Neurology

## 2014-12-14 DIAGNOSIS — R208 Other disturbances of skin sensation: Secondary | ICD-10-CM | POA: Diagnosis not present

## 2014-12-14 DIAGNOSIS — R269 Unspecified abnormalities of gait and mobility: Secondary | ICD-10-CM

## 2014-12-14 DIAGNOSIS — R209 Unspecified disturbances of skin sensation: Secondary | ICD-10-CM

## 2014-12-14 DIAGNOSIS — G2581 Restless legs syndrome: Secondary | ICD-10-CM

## 2014-12-14 NOTE — Procedures (Signed)
     HISTORY:  Kristi Alexander is an 81 year old patient with a history of prior lumbosacral spine surgery who is now reporting some symptoms of pulling sensations in the legs, right greater than left, and a sense of fatigue in the legs with walking. The patient has not had any falls, she is being evaluated for these new symptoms.  NERVE CONDUCTION STUDIES:  Nerve conduction studies were performed on the left upper extremity. The distal motor latencies and motor amplitudes for the median and ulnar nerves were within normal limits. The F wave latencies and nerve conduction velocities for these nerves were also normal. The sensory latencies for the median and ulnar nerves were normal.  Nerve conduction studies were performed on both lower extremities. The distal motor latencies for the peroneal and posterior tibial nerves were normal bilaterally, with low motor amplitudes for these nerves bilaterally. The nerve conduction velocities for the peroneal and posterior tibial nerves were normal bilaterally. The H reflex latencies were symmetric and normal. The peroneal sensory latencies were symmetric and normal.  EMG STUDIES:  EMG study was performed on the right lower extremity:  The tibialis anterior muscle reveals 2 to 4K motor units with decreased recruitment. No fibrillations or positive waves were seen. The peroneus tertius muscle reveals 2 to 4K motor units with decreased recruitment. No fibrillations or positive waves were seen. The medial gastrocnemius muscle reveals 1 to 3K motor units with full recruitment. No fibrillations or positive waves were seen. The vastus lateralis muscle reveals 2 to 4K motor units with full recruitment. No fibrillations or positive waves were seen. The iliopsoas muscle reveals 2 to 4K motor units with decreased recruitment. No fibrillations or positive waves were seen. The biceps femoris muscle (long head) reveals 2 to 6K motor units with decreased recruitment. No  fibrillations or positive waves were seen. The lumbosacral paraspinal muscles were tested at 3 levels, and revealed no abnormalities of insertional activity at all 3 levels tested. There was good relaxation.   IMPRESSION:  Nerve conduction studies done on the left upper extremity and both lower extremities reveals no clear evidence of a peripheral neuropathy, there is generalized lowering of motor amplitudes in both legs. EMG evaluation of the right lower extremity shows evidence of a chronic stable L5 radiculopathy.  Jill Alexanders MD 12/14/2014 1:50 PM  [facility] [address] [place], [place] [address]  Phone [phone] Fax [phone]

## 2014-12-14 NOTE — Progress Notes (Signed)
Please refer to EMG and nerve conduction study procedure note. 

## 2014-12-14 NOTE — Progress Notes (Signed)
Kristi Alexander is an 81 year old patient with a history of prior lumbosacral spine surgery. She reports some issues with sensation of pulling in the legs right greater than left, some fatigue in the legs with walking.  EMG nerve conduction study done today does not show evidence of a peripheral neuropathy, but generalized lowering of motor amplitudes were seen on nerve conductions. The patient has evidence of a chronic stable L5 radiculopathy.  The patient be sent for MRI of the low back to evaluate for possible spinal stenosis. She has not had any falls his last seen, she will follow-up in 4 months.

## 2014-12-14 NOTE — Progress Notes (Signed)
Internal Medicine Clinic Attending  I saw and evaluated the patient.  I personally confirmed the key portions of the history and exam documented by Dr. Boswell and I reviewed pertinent patient test results.  The assessment, diagnosis, and plan were formulated together and I agree with the documentation in the resident's note. 

## 2015-01-02 ENCOUNTER — Ambulatory Visit
Admission: RE | Admit: 2015-01-02 | Discharge: 2015-01-02 | Disposition: A | Discharge status: Home / Self Care | Payer: Medicare Other | Source: Ambulatory Visit | Attending: Neurology | Admitting: Neurology

## 2015-01-02 ENCOUNTER — Telehealth: Payer: Self-pay | Admitting: Neurology

## 2015-01-02 DIAGNOSIS — R269 Unspecified abnormalities of gait and mobility: Secondary | ICD-10-CM

## 2015-01-02 DIAGNOSIS — M48061 Spinal stenosis, lumbar region without neurogenic claudication: Secondary | ICD-10-CM

## 2015-01-02 DIAGNOSIS — R209 Unspecified disturbances of skin sensation: Secondary | ICD-10-CM

## 2015-01-02 NOTE — Telephone Encounter (Signed)
  I called the patient. The patient has significant degenerative changes in the back, several levels of significant spinal stenosis. This likely explains the walking problems in the difficulty with the legs. I will set the patient up for an epidural steroid injection to see if this helps.  MRI lumbar 01/02/2015:  IMPRESSION: 1. Worsened lumbar spondylosis and degenerative disc disease, causing prominent impingement at L3-4 and L4-5; moderate to prominent impingement at L5-S1; and moderate impingement at L1- 2 and L2- 3, as detailed above. 2. Dextroconvex lumbar scoliosis.

## 2015-01-03 ENCOUNTER — Other Ambulatory Visit: Payer: Self-pay | Admitting: Neurology

## 2015-01-03 DIAGNOSIS — M48061 Spinal stenosis, lumbar region without neurogenic claudication: Secondary | ICD-10-CM

## 2015-01-08 ENCOUNTER — Ambulatory Visit
Admission: RE | Admit: 2015-01-08 | Discharge: 2015-01-08 | Disposition: A | Discharge status: Home / Self Care | Payer: Medicare Other | Source: Ambulatory Visit | Attending: Neurology | Admitting: Neurology

## 2015-01-08 DIAGNOSIS — M48061 Spinal stenosis, lumbar region without neurogenic claudication: Secondary | ICD-10-CM

## 2015-01-08 MED ORDER — IOHEXOL 180 MG/ML  SOLN
1.0000 mL | Freq: Once | INTRAMUSCULAR | Status: DC | PRN
  Administered 2015-01-08: 1 mL via EPIDURAL

## 2015-01-08 MED ORDER — METHYLPREDNISOLONE ACETATE 40 MG/ML INJ SUSP (RADIOLOG
120.0000 mg | Freq: Once | INTRAMUSCULAR | Status: AC
  Administered 2015-01-08: 120 mg via EPIDURAL

## 2015-01-08 NOTE — Discharge Instructions (Signed)

## 2015-01-10 ENCOUNTER — Encounter: Payer: Self-pay | Admitting: Internal Medicine

## 2015-01-10 ENCOUNTER — Ambulatory Visit (INDEPENDENT_AMBULATORY_CARE_PROVIDER_SITE_OTHER): Payer: Medicare Other | Admitting: Internal Medicine

## 2015-01-10 VITALS — BP 150/80 | HR 60 | Temp 97.8°F | Ht 62.0 in | Wt 146.6 lb

## 2015-01-10 DIAGNOSIS — R05 Cough: Secondary | ICD-10-CM | POA: Diagnosis not present

## 2015-01-10 DIAGNOSIS — Z6826 Body mass index (BMI) 26.0-26.9, adult: Secondary | ICD-10-CM | POA: Diagnosis not present

## 2015-01-10 DIAGNOSIS — E440 Moderate protein-calorie malnutrition: Secondary | ICD-10-CM

## 2015-01-10 DIAGNOSIS — I10 Essential (primary) hypertension: Secondary | ICD-10-CM | POA: Diagnosis not present

## 2015-01-10 DIAGNOSIS — R053 Chronic cough: Secondary | ICD-10-CM

## 2015-01-10 DIAGNOSIS — M791 Myalgia, unspecified site: Secondary | ICD-10-CM

## 2015-01-10 MED ORDER — SPIRONOLACTONE 25 MG PO TABS: 25.0000 mg | ORAL_TABLET | Freq: Every day | ORAL | Status: DC

## 2015-01-10 MED ORDER — PANTOPRAZOLE SODIUM 40 MG PO TBEC: 40.0000 mg | DELAYED_RELEASE_TABLET | Freq: Every day | ORAL | Status: DC

## 2015-01-10 NOTE — Patient Instructions (Signed)
Thank you for coming in today  -I am stating you on a new medication for your blood pressure called Spironolactone. Take it once a day. -We will also try a medication for possible reflux and see it if helps with your reflux. Take the Protonix every night before dinner. We also get you set up for Pulmonary Function Test to look for scaring. -You stop taking the Lipitor and the dietary supplements -I would like to see you back in 2 weeks for a recheck

## 2015-01-10 NOTE — Progress Notes (Signed)
Patient ID: TENNA LACKO, female   DOB: 1925/04/07, 81 y.o.   MRN: [id number]   Subjective:   Patient ID: DORREEN VALIENTE female   DOB: 08-19-1925 81 y.o.   MRN: [id number]  HPI: Ms.Brenlyn Viona Gilmore Blakeley is a 81 y.o. female with a past medical history listed below here today for follow up of her HTN and chronic cough.  For details of today's visit and the status of her chronic medical issues please refer to the assessment and plan.  Past Medical History  Diagnosis Date  . Restless leg syndrome   . Hypertension   . Dyslipidemia   . History of appendectomy   . H/O: hysterectomy   . Cyst of breast, right, benign solitary   . Inguinal hernia   . History of lumbosacral spine surgery   . Arthritis   . HHNC (hyperglycemic hyperosmolar nonketotic coma) ([place]) 05/14/2013  . Abnormality of gait 11/22/2014   Current Outpatient Prescriptions  Medication Sig Dispense Refill  . amLODipine (NORVASC) 5 MG tablet Take 5 mg by mouth daily.    Marland Kitchen aspirin 81 MG tablet 81 mg by Gastric Tube route daily.     . ferrous sulfate 325 (65 FE) MG tablet Take 325 mg by mouth 2 (two) times daily with a meal.    . hydrOXYzine (ATARAX/VISTARIL) 25 MG tablet Take 25 mg by mouth daily as needed.    . isosorbide mononitrate (IMDUR) 30 MG 24 hr tablet Take 1.5 tablets (45 mg total) by mouth daily. 60 tablet 0  . losartan (COZAAR) 50 MG tablet Take 1 tablet (50 mg total) by mouth daily. 30 tablet 0  . metoprolol succinate (TOPROL-XL) 25 MG 24 hr tablet Take 1 tablet (25 mg total) by mouth daily. 30 tablet 0  . pantoprazole (PROTONIX) 40 MG tablet Take 1 tablet (40 mg total) by mouth daily. 30 tablet 0  . pramipexole (MIRAPEX) 0.5 MG tablet Take 1 tablet (0.5 mg total) by mouth daily. 90 tablet 1  . spironolactone (ALDACTONE) 25 MG tablet Take 1 tablet (25 mg total) by mouth daily. 30 tablet 0   No current facility-administered medications for this visit.   Family History  Problem Relation Age of Onset  . Heart attack  Father   . Lung cancer Brother   . Healthy Sister    Social History   Social History  . Marital Status: Married    Spouse Name: [name]   . Number of Children: 2  . Years of Education: 12+   Occupational History  . retired    Social History Main Topics  . Smoking status: Former Smoker -- 0.00 packs/day    Types: Cigarettes    Quit date: 02/25/2003  . Smokeless tobacco: Never Used  . Alcohol Use: No     Comment: socailly.  . Drug Use: No  . Sexual Activity: Not Asked   Other Topics Concern  . None   Social History Narrative   Patient is married and lives with her husband. The patient is retired. Patient has 2 children and has a college education.    Patient is right handed.   Patient drinks very little caffeine.   Review of Systems: Review of Systems  Constitutional: Negative for fever, chills and weight loss.  Eyes: Negative for blurred vision and double vision.  Respiratory: Positive for cough. Negative for hemoptysis, sputum production, shortness of breath and wheezing.   Cardiovascular: Negative for chest pain.  Musculoskeletal: Negative for falls.  Neurological: Negative for dizziness and  headaches.   Objective:  Physical Exam: Filed Vitals:   01/10/15 1412 01/10/15 1507  BP: 183/69 150/80  Pulse: 57 60  Temp: 97.8 F (36.6 C)   TempSrc: Oral   Height: '5\' 2"'$  (1.575 m)   Weight: 146 lb 9.6 oz (66.497 kg)   SpO2: 100%    PHYSICAL EXAM GENERAL- alert, co-operative, appears as stated age, not in any distress. HEENT- Atraumatic, normocephalic, PERRL, EOMI, oral mucosa appears moist CARDIAC- RRR, no murmurs, rubs or gallops. RESP- Moving equal volumes of air, and clear to auscultation bilaterally, no wheezes or crackles. ABDOMEN- Soft, nontender, bowel sounds present. NEURO- No obvious Cr N abnormality. EXTREMITIES- pulse 2+, symmetric, no pedal edema. SKIN- Warm, dry, No rash or lesion. PSYCH- Normal mood and affect, appropriate thought content and  speech.  Assessment & Plan:   Case discussed with Dr. Lynnae January. Please refer to Problem based charting for further details of today's visit.

## 2015-01-10 NOTE — Assessment & Plan Note (Addendum)
Reports continued cough. Mostly at night. Denies any GERD symptoms. Was not able to get PFTs after last visit.   Will give 6-8 week trial of Protonix and monitor for improvement and schedule PFTs.

## 2015-01-11 DIAGNOSIS — M791 Myalgia, unspecified site: Secondary | ICD-10-CM | POA: Insufficient documentation

## 2015-01-11 NOTE — Assessment & Plan Note (Signed)
Patient reports some ongoing myalgias the past several weeks. She is currently on Lipitor 20 mg daily for primary prevention. No history of CVA or CAD. No lipid panel on file. Unclear benefit of a statin in an 81 year old woman for primary prevention. Will stop the Lipitor today and assess if the myalgias improve.

## 2015-01-11 NOTE — Assessment & Plan Note (Addendum)
BP Readings from Last 3 Encounters:  01/10/15 150/80  01/08/15 205/86  12/05/14 177/81    Lab Results  Component Value Date   NA 142 12/05/2014   K 4.5 12/05/2014   CREATININE 0.92 12/05/2014    Assessment: Comments: Patient's blood pressure was 183/69 on arrival. She has documentation from nursing visit that shows it was 451 systolic. Has been hypertensive on previous visit. Currently she is taking Amlodipine 5 mg daily, imdur 30 mg daily, losartan 100 mg daily and metoprolol 25 mg daily. Her losartan was increased form 50 to 100 at 10/21 visit with Dr. Ophelia Charter (notes not in our system). She denies any headaches, dizziness, blurred vision or any other symptoms. Blood pressure did improved to 150/80 on recheck. Patient does have a CT from 03/2013 that showed a stable 2 cm nodulealong the lower pole of the left adrenal. Reports she has had HTN since her 48s that has always been difficult to control. Reports compliance with her medications.   Plan: She continues to have resistant hypertension despite therapy with 4 medications. Possible adrenal adenoma on previous CT. Will add spironolactone 25 mg daily today. RTC in 2 weeks for recheck and BMP.

## 2015-01-11 NOTE — Assessment & Plan Note (Signed)
Patient's weight has continued to improve. 146 lbs today with BMI 26.9. She does note that she does not have a great appetite but still eats 3 meals a day. Is currently on nutritional supplements after previous hospitalization and weight was down to 119 lbs at that time.  Can stop the nutritional supplements at this time and continue to monitor her weight in the future. Did have a bowel resection and will need to monitor B12 levels.

## 2015-01-12 NOTE — Progress Notes (Signed)
Internal Medicine Clinic Attending  I saw and evaluated the patient.  I personally confirmed the key portions of the history and exam documented by Dr. Boswell and I reviewed pertinent patient test results.  The assessment, diagnosis, and plan were formulated together and I agree with the documentation in the resident's note. 

## 2015-01-24 ENCOUNTER — Encounter: Payer: Self-pay | Admitting: Internal Medicine

## 2015-01-24 ENCOUNTER — Ambulatory Visit (INDEPENDENT_AMBULATORY_CARE_PROVIDER_SITE_OTHER): Payer: Medicare Other | Admitting: Internal Medicine

## 2015-01-24 VITALS — BP 150/80 | HR 55 | Temp 98.1°F | Ht 62.0 in | Wt 147.1 lb

## 2015-01-24 DIAGNOSIS — Z0000 Encounter for general adult medical examination without abnormal findings: Secondary | ICD-10-CM | POA: Diagnosis not present

## 2015-01-24 DIAGNOSIS — I10 Essential (primary) hypertension: Secondary | ICD-10-CM

## 2015-01-24 DIAGNOSIS — M791 Myalgia, unspecified site: Secondary | ICD-10-CM

## 2015-01-24 DIAGNOSIS — E2839 Other primary ovarian failure: Secondary | ICD-10-CM

## 2015-01-24 DIAGNOSIS — R053 Chronic cough: Secondary | ICD-10-CM

## 2015-01-24 DIAGNOSIS — R05 Cough: Secondary | ICD-10-CM | POA: Diagnosis not present

## 2015-01-24 DIAGNOSIS — M4806 Spinal stenosis, lumbar region: Secondary | ICD-10-CM

## 2015-01-24 DIAGNOSIS — M48061 Spinal stenosis, lumbar region without neurogenic claudication: Secondary | ICD-10-CM | POA: Insufficient documentation

## 2015-01-24 NOTE — Patient Instructions (Addendum)
Thank you for coming in today   -We will get you schedule to have pulmonary function test and the bone density test -Continue taking your other medications as before -Please record your blood pressure at home and bring the log with you to your next visit -I would like to see you back in 3 months for follow up

## 2015-01-24 NOTE — Progress Notes (Signed)
Patient ID: Kristi Alexander, female   DOB: 05-Aug-1925, 81 y.o.   MRN: [id number]   Subjective:   Patient ID: Kristi Alexander female   DOB: 09-Feb-1926 81 y.o.   MRN: [id number]  HPI: Kristi Alexander is a 81 y.o. female with a past medical history listed below here today for follow up of her chronic cough and HTN.   For details of today's visit and the status of her chronic medical issues please refer to the assessment and plan.  Past Medical History  Diagnosis Date  . Restless leg syndrome   . Hypertension   . Dyslipidemia   . History of appendectomy   . H/O: hysterectomy   . Cyst of breast, right, benign solitary   . Inguinal hernia   . History of lumbosacral spine surgery   . Arthritis   . HHNC (hyperglycemic hyperosmolar nonketotic coma) ([place]) 05/14/2013  . Abnormality of gait 11/22/2014   Current Outpatient Prescriptions  Medication Sig Dispense Refill  . amLODipine (NORVASC) 5 MG tablet Take 5 mg by mouth daily.    Marland Kitchen aspirin 81 MG tablet 81 mg by Gastric Tube route daily.     . ferrous sulfate 325 (65 FE) MG tablet Take 325 mg by mouth 2 (two) times daily with a meal.    . hydrOXYzine (ATARAX/VISTARIL) 25 MG tablet Take 25 mg by mouth daily as needed.    . isosorbide mononitrate (IMDUR) 30 MG 24 hr tablet Take 1.5 tablets (45 mg total) by mouth daily. 60 tablet 0  . losartan (COZAAR) 50 MG tablet Take 1 tablet (50 mg total) by mouth daily. 30 tablet 0  . metoprolol succinate (TOPROL-XL) 25 MG 24 hr tablet Take 1 tablet (25 mg total) by mouth daily. 30 tablet 0  . pantoprazole (PROTONIX) 40 MG tablet Take 1 tablet (40 mg total) by mouth daily. 30 tablet 0  . pramipexole (MIRAPEX) 0.5 MG tablet Take 1 tablet (0.5 mg total) by mouth daily. 90 tablet 1  . spironolactone (ALDACTONE) 25 MG tablet Take 1 tablet (25 mg total) by mouth daily. 30 tablet 0   No current facility-administered medications for this visit.   Family History  Problem Relation Age of Onset  . Heart attack  Father   . Lung cancer Brother   . Healthy Sister    Social History   Social History  . Marital Status: Married    Spouse Name: [name]   . Number of Children: 2  . Years of Education: 12+   Occupational History  . retired    Social History Main Topics  . Smoking status: Former Smoker -- 0.00 packs/day    Types: Cigarettes    Quit date: 02/25/2003  . Smokeless tobacco: Never Used  . Alcohol Use: No     Comment: socailly.  . Drug Use: No  . Sexual Activity: Not Asked   Other Topics Concern  . None   Social History Narrative   Patient is married and lives with her husband. The patient is retired. Patient has 2 children and has a college education.    Patient is right handed.   Patient drinks very little caffeine.   Review of Systems: Review of Systems  Eyes: Negative for blurred vision and double vision.  Respiratory: Positive for cough. Negative for sputum production, shortness of breath and wheezing.   Cardiovascular: Negative for chest pain.  Gastrointestinal: Negative for nausea, vomiting, abdominal pain and diarrhea.  Musculoskeletal: Positive for joint pain.  Neurological: Negative for  dizziness, weakness and headaches.   Objective:  Physical Exam: Filed Vitals:   01/24/15 1339 01/24/15 1401  BP: 190/70 150/80  Pulse: 55 55  Temp: 98.1 F (36.7 C)   TempSrc: Oral   Height: '5\' 2"'$  (1.575 m)   Weight: 147 lb 1.6 oz (66.724 kg)   SpO2: 100%    PHYSICAL EXAM GENERAL- alert, co-operative, appears as stated age, not in any distress. HEENT- Atraumatic, normocephalic, PERRL, EOMI, oral mucosa appears moist CARDIAC- RRR, no murmurs, rubs or gallops. RESP- Moving equal volumes of air, and clear to auscultation bilaterally, no wheezes or crackles. ABDOMEN- Soft, nontender, bowel sounds present. NEURO- No obvious Cr N abnormality. EXTREMITIES- pulse 2+, symmetric, no pedal edema. SKIN- Warm, dry, No rash or lesion. PSYCH- Normal mood and affect, appropriate  thought content and speech.  Assessment & Plan:   Case discussed with Dr. Eppie Gibson. Please refer to Problem based charting for further details of today's visit.

## 2015-01-24 NOTE — Assessment & Plan Note (Signed)
She reports improvement in her cough since starting the Protonix. No longer being woken up at night. Still endorses intermittent cough. Has not yet had PFTs done.   Will get PFT scheduled. Continue Protonix.

## 2015-01-24 NOTE — Assessment & Plan Note (Signed)
BP Readings from Last 3 Encounters:  01/24/15 150/80  01/10/15 150/80  01/08/15 205/86    Lab Results  Component Value Date   NA 142 12/05/2014   K 4.5 12/05/2014   CREATININE 0.92 12/05/2014    Assessment: Ms. Tolleson's BP initially was 190/70. Improved to 150/80 on repeat, at goal of <150/90. She reports her blood pressure runs 007-622 systolic at home. Denies any symptoms of headache, vision changes, dizziness, or N/V. She is currently on Amlodipine 5 mg daily, imdur 30 mg daily, losartan 100 mg daily and metoprolol 25 mg daily, and spironolactone 25 mg daily (started at last visit).  Plan: Will continue her current medications. Give her BP log. Checking BMET today.

## 2015-01-24 NOTE — Assessment & Plan Note (Signed)
Ms. Garnett complains of some numbness of her right great toe. Denies any pain. Sensation is intact in right foot but decreased from left foot. Recent B12 levels were normal. MRI lumbar 01/02/2015:  IMPRESSION: 1. Worsened lumbar spondylosis and degenerative disc disease, causing prominent impingement at L3-4 and L4-5; moderate to prominent impingement at L5-S1; and moderate impingement at L1- 2 and L2- 3, as detailed above. 2. Dextroconvex lumbar scoliosis.  She has follow up with Neurology for steroid injection.

## 2015-01-24 NOTE — Assessment & Plan Note (Signed)
Will schedule patient for DEXA scan

## 2015-01-24 NOTE — Assessment & Plan Note (Signed)
Resolved since stopping Lipitor. Will not restart a statin at this time for primary prevention.

## 2015-01-25 LAB — BASIC METABOLIC PANEL
BUN / CREAT RATIO: 28 — AB (ref 11–26)
BUN: 23 mg/dL (ref 8–27)
CALCIUM: 9.4 mg/dL (ref 8.7–10.3)
CHLORIDE: 104 mmol/L (ref 97–106)
CO2: 21 mmol/L (ref 18–29)
Creatinine, Ser: 0.82 mg/dL (ref 0.57–1.00)
GFR calc non Af Amer: 65 mL/min/{1.73_m2} (ref >59)
GFR, EST AFRICAN AMERICAN: 74 mL/min/{1.73_m2} (ref >59)
Glucose: 112 mg/dL — ABNORMAL HIGH (ref 65–99)
POTASSIUM: 4.2 mmol/L (ref 3.5–5.2)
SODIUM: 141 mmol/L (ref 136–144)

## 2015-01-25 NOTE — Progress Notes (Signed)
I saw and evaluated the patient. I personally confirmed the key portions of Dr. Boswell's history and exam and reviewed pertinent patient test results. The assessment, diagnosis, and plan were formulated together and I agree with the documentation in the resident's note. 

## 2015-02-01 ENCOUNTER — Encounter: Payer: Self-pay | Admitting: Gastroenterology

## 2015-02-01 ENCOUNTER — Ambulatory Visit (HOSPITAL_COMMUNITY)
Admission: RE | Admit: 2015-02-01 | Discharge: 2015-02-01 | Disposition: A | Discharge status: Home / Self Care | Payer: Medicare Other | Source: Ambulatory Visit | Attending: Internal Medicine | Admitting: Internal Medicine

## 2015-02-01 DIAGNOSIS — R05 Cough: Secondary | ICD-10-CM | POA: Diagnosis not present

## 2015-02-01 DIAGNOSIS — R053 Chronic cough: Secondary | ICD-10-CM

## 2015-02-01 LAB — PULMONARY FUNCTION TEST
DL/VA % PRED: 76 %
DL/VA: 3.47 ml/min/mmHg/L
DLCO unc % pred: 58 %
DLCO unc: 12.58 ml/min/mmHg
FEF 25-75 PRE: 0.89 L/s
FEF2575-%Pred-Pre: 101 %
FEV1-%Change-Post: 1 %
FEV1-%PRED-PRE: 155 %
FEV1-%Pred-Post: 158 %
FEV1-PRE: 1.81 L
FEV1-Post: 1.84 L
FEV1FVC-%Change-Post: 47 %
FEV1FVC-%PRED-PRE: 84 %
FEV6-%Change-Post: -34 %
FEV6-%Pred-Post: 133 %
FEV6-%Pred-Pre: 202 %
FEV6-POST: 1.89 L
FEV6-Pre: 2.88 L
FEV6FVC-%Pred-Post: 105 %
FEV6FVC-%Pred-Pre: 105 %
FVC-%CHANGE-POST: -31 %
FVC-%PRED-POST: 131 %
FVC-%Pred-Pre: 192 %
FVC-Post: 1.98 L
FVC-Pre: 2.88 L
POST FEV1/FVC RATIO: 93 %
PRE FEV1/FVC RATIO: 63 %
Post FEV6/FVC ratio: 100 %
Pre FEV6/FVC Ratio: 100 %
RV % PRED: 64 %
RV: 1.59 L
TLC % pred: 81 %
TLC: 3.89 L

## 2015-02-01 MED ORDER — ALBUTEROL SULFATE (2.5 MG/3ML) 0.083% IN NEBU
2.5000 mg | INHALATION_SOLUTION | Freq: Once | RESPIRATORY_TRACT | Status: AC
  Administered 2015-02-01: 2.5 mg via RESPIRATORY_TRACT

## 2015-02-08 ENCOUNTER — Other Ambulatory Visit: Payer: Self-pay | Admitting: Internal Medicine

## 2015-02-08 MED ORDER — ISOSORBIDE MONONITRATE ER 30 MG PO TB24: 45.0000 mg | ORAL_TABLET | Freq: Every day | ORAL | Status: DC

## 2015-02-08 NOTE — Telephone Encounter (Signed)
Pt requesting refills isosorbide mononitrate.

## 2015-02-09 NOTE — Telephone Encounter (Signed)
Rx called in to pharmacy. Hilda Blades Jemaine Prokop RN 02/09/15 11:13AM

## 2015-02-27 ENCOUNTER — Other Ambulatory Visit: Payer: Self-pay | Admitting: Internal Medicine

## 2015-02-27 NOTE — Telephone Encounter (Signed)
Pt requesting metoprolol to be filled @ [facility] on [address].

## 2015-03-02 ENCOUNTER — Other Ambulatory Visit: Payer: Self-pay | Admitting: Internal Medicine

## 2015-03-02 NOTE — Telephone Encounter (Signed)
Pt requesting hydroxyzine to be filled @ [facility] on [address].

## 2015-03-05 MED ORDER — METOPROLOL SUCCINATE ER 25 MG PO TB24: 25.0000 mg | ORAL_TABLET | Freq: Every day | ORAL | Status: DC

## 2015-03-05 MED ORDER — HYDROXYZINE HCL 25 MG PO TABS: 25.0000 mg | ORAL_TABLET | Freq: Every day | ORAL | Status: DC | PRN

## 2015-03-14 DIAGNOSIS — E2839 Other primary ovarian failure: Secondary | ICD-10-CM | POA: Insufficient documentation

## 2015-03-14 NOTE — Addendum Note (Signed)
Addended by:  Lions on: 03/14/2015 11:54 AM   Modules accepted: Orders

## 2015-03-14 NOTE — Assessment & Plan Note (Signed)
82 year old female with no prior DEXA scan. Will order DEXA scan.

## 2015-03-21 ENCOUNTER — Ambulatory Visit (INDEPENDENT_AMBULATORY_CARE_PROVIDER_SITE_OTHER): Payer: Medicare Other | Admitting: Internal Medicine

## 2015-03-21 DIAGNOSIS — R05 Cough: Secondary | ICD-10-CM

## 2015-03-21 DIAGNOSIS — G4762 Sleep related leg cramps: Secondary | ICD-10-CM | POA: Diagnosis not present

## 2015-03-21 DIAGNOSIS — I10 Essential (primary) hypertension: Secondary | ICD-10-CM | POA: Diagnosis not present

## 2015-03-21 DIAGNOSIS — R053 Chronic cough: Secondary | ICD-10-CM

## 2015-03-21 NOTE — Patient Instructions (Signed)
Thank you for coming in today  -Continue taking your medications as before -I am checking some blood work today- if there are any problems I will call you -I would like to see you in 3 months for follow up

## 2015-03-22 LAB — BMP8+ANION GAP
ANION GAP: 16 mmol/L (ref 10.0–18.0)
BUN/Creatinine Ratio: 20 (ref 11–26)
BUN: 15 mg/dL (ref 8–27)
CALCIUM: 9.4 mg/dL (ref 8.7–10.3)
CO2: 23 mmol/L (ref 18–29)
CREATININE: 0.74 mg/dL (ref 0.57–1.00)
Chloride: 104 mmol/L (ref 96–106)
GFR, EST AFRICAN AMERICAN: 84 mL/min/{1.73_m2} (ref >59)
GFR, EST NON AFRICAN AMERICAN: 73 mL/min/{1.73_m2} (ref >59)
Glucose: 103 mg/dL — ABNORMAL HIGH (ref 65–99)
Potassium: 4.2 mmol/L (ref 3.5–5.2)
Sodium: 143 mmol/L (ref 134–144)

## 2015-03-22 LAB — MAGNESIUM: Magnesium: 1.7 mg/dL (ref 1.6–2.3)

## 2015-03-22 LAB — PHOSPHORUS: Phosphorus: 2.5 mg/dL (ref 2.5–4.5)

## 2015-03-24 DIAGNOSIS — G4762 Sleep related leg cramps: Secondary | ICD-10-CM | POA: Insufficient documentation

## 2015-03-24 MED ORDER — VITAMIN B-12 1000 MCG PO TABS: 1000.0000 ug | ORAL_TABLET | Freq: Every day | ORAL | Status: DC

## 2015-03-24 MED ORDER — LOSARTAN POTASSIUM 100 MG PO TABS: 100.0000 mg | ORAL_TABLET | Freq: Every day | ORAL | Status: DC

## 2015-03-24 NOTE — Assessment & Plan Note (Addendum)
Kristi Alexander reports that over the past 2-3 weeks she has developed nocturnal leg cramps with R>L. She reports the cramping in mostly in her calf. She has tried streches that seem to have improved the symptoms but not eliminated them entirely. She does have a history of bowel resection and B12 deficiency though her last B12 on 10/11 was wnl. She reports a history of RLS in the past but reports that this is more cramping and not like her restless leg syndrome symptoms she previously experienced.   Plan  BMP, Mg, Phos all wnl today. She reports she is taking B12 daily. Continue stretching exercises. Continue to monitor.

## 2015-03-24 NOTE — Assessment & Plan Note (Signed)
BP Readings from Last 3 Encounters:  03/21/15 169/74  01/24/15 150/80  01/10/15 150/80    Lab Results  Component Value Date   NA 143 03/21/2015   K 4.2 03/21/2015   CREATININE 0.74 03/21/2015    Assessment: Kristi Alexander blood pressure was initially 169/74 today but improved to 140/80 on re-check. She reports she does check her blood pressure at home (did not bring the log) and reports it runs 145-150/75-80 at home. She denies any lightheadedness, dizziness, headaches or vision changes. She is currently on Amlodipine 5 mg daily, imdur 30 mg daily, losartan 100 mg daily and metoprolol 25 mg daily. She was started on spironolactone 25 mg daily at her 11/16 visit but she reports she is not familiar with that medication and does not believe she is taking it at home (she does not have her medications with her today).  Plan: Will continue her current regimen. Do not believe she is taking spironolactone at this time and can be re-started in the future if her blood pressure becomes even more difficult to control.

## 2015-03-24 NOTE — Progress Notes (Signed)
Patient ID: Kristi Alexander, female   DOB: 1926/02/17, 82 y.o.   MRN: [id number]   Subjective:   Patient ID: Kristi Alexander female   DOB: [birth year] 82 y.o.   MRN: [id number]  HPI: [name] is a 82 y.o. female with a past medical history listed below here today for follow up of her HTN and chronic cough.  For details of today's visit and the status of her chronic medical issues please refer to the assessment and plan.   Past Medical History  Diagnosis Date  . Restless leg syndrome   . Hypertension   . Dyslipidemia   . History of appendectomy   . H/O: hysterectomy   . Cyst of breast, right, benign solitary   . Inguinal hernia   . History of lumbosacral spine surgery   . Arthritis   . HHNC (hyperglycemic hyperosmolar nonketotic coma) ([place]) 05/14/2013  . Abnormality of gait 11/22/2014   Current Outpatient Prescriptions  Medication Sig Dispense Refill  . amLODipine (NORVASC) 5 MG tablet Take 5 mg by mouth daily.    Marland Kitchen aspirin 81 MG tablet 81 mg by Gastric Tube route daily.     . ferrous sulfate 325 (65 FE) MG tablet Take 325 mg by mouth 2 (two) times daily with a meal.    . hydrOXYzine (ATARAX/VISTARIL) 25 MG tablet Take 1 tablet (25 mg total) by mouth daily as needed. 30 tablet 2  . isosorbide mononitrate (IMDUR) 30 MG 24 hr tablet Take 1.5 tablets (45 mg total) by mouth daily. 60 tablet 2  . losartan (COZAAR) 50 MG tablet Take 1 tablet (50 mg total) by mouth daily. 30 tablet 0  . metoprolol succinate (TOPROL-XL) 25 MG 24 hr tablet Take 1 tablet (25 mg total) by mouth daily. 30 tablet 0  . pantoprazole (PROTONIX) 40 MG tablet Take 1 tablet (40 mg total) by mouth daily. 30 tablet 0  . pramipexole (MIRAPEX) 0.5 MG tablet Take 1 tablet (0.5 mg total) by mouth daily. 90 tablet 1  . spironolactone (ALDACTONE) 25 MG tablet Take 1 tablet (25 mg total) by mouth daily. 30 tablet 0   No current facility-administered medications for this visit.   Family History  Problem Relation Age  of Onset  . Heart attack Father   . Lung cancer Brother   . Healthy Sister    Social History   Social History  . Marital Status: Married    Spouse Name: [name]   . Number of Children: 2  . Years of Education: 12+   Occupational History  . retired    Social History Main Topics  . Smoking status: Former Smoker -- 0.00 packs/day    Types: Cigarettes    Quit date: 02/25/2003  . Smokeless tobacco: Never Used  . Alcohol Use: No     Comment: socailly.  . Drug Use: No  . Sexual Activity: Not on file   Other Topics Concern  . Not on file   Social History Narrative   Patient is married and lives with her husband. The patient is retired. Patient has 2 children and has a college education.    Patient is right handed.   Patient drinks very little caffeine.   Review of Systems: Review of Systems  Eyes: Negative for blurred vision.  Respiratory: Positive for cough. Negative for sputum production.   Gastrointestinal: Negative for nausea, vomiting and abdominal pain.  Neurological: Negative for dizziness and headaches.   Objective:  Physical Exam: Filed Vitals:   03/21/15  1538  BP: 169/74  Pulse: 80  Temp: 98.2 F (36.8 C)  TempSrc: Oral  Height: '5\' 2"'$  (1.575 m)  Weight: 152 lb 1.6 oz (68.992 kg)  SpO2: 100%   GENERAL- alert, co-operative, appears as stated age, not in any distress. HEENT- Atraumatic, normocephalic, PERRL, EOMI, oral mucosa appears moist CARDIAC- RRR, no murmurs, rubs or gallops. RESP- Moving equal volumes of air, and clear to auscultation bilaterally, no wheezes or crackles. ABDOMEN- Soft, nontender, bowel sounds present. NEURO- No obvious Cr N abnormality. EXTREMITIES- pulse 2+, symmetric, no pedal edema. SKIN- Warm, dry, No rash or lesion. PSYCH- Normal mood and affect, appropriate thought content and speech.  Assessment & Plan:   Case discussed with Dr. Eppie Gibson. Please refer to Problem based charting for further details of today's visit.

## 2015-03-24 NOTE — Assessment & Plan Note (Signed)
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 She reports that her cough has improved to only twice a week. No longer interferes with her sleep. She reports to me that she took the Protonix for a month and saw gradual improvement in her cough and stopped the Protonix after that month. She has had no further problems with her cough off the Protonix for the past month.   Plan  Will continue to monitor. If cough returns will consider restarting Protonix.

## 2015-03-26 ENCOUNTER — Other Ambulatory Visit: Payer: Self-pay | Admitting: Internal Medicine

## 2015-03-26 ENCOUNTER — Other Ambulatory Visit: Payer: Self-pay | Admitting: Neurology

## 2015-03-26 NOTE — Telephone Encounter (Signed)
Patient requesting refill on her metoprolol

## 2015-03-26 NOTE — Progress Notes (Signed)
Case discussed with Dr. Boswell at the time of the visit. We reviewed the resident's history and exam and pertinent patient test results. I agree with the assessment, diagnosis, and plan of care documented in the resident's note. 

## 2015-03-28 MED ORDER — METOPROLOL SUCCINATE ER 25 MG PO TB24: 25.0000 mg | ORAL_TABLET | Freq: Every day | ORAL | Status: DC

## 2015-03-29 NOTE — Telephone Encounter (Signed)
CALLED TO PHARM.

## 2015-04-11 ENCOUNTER — Ambulatory Visit
Admission: RE | Admit: 2015-04-11 | Discharge: 2015-04-11 | Disposition: A | Discharge status: Home / Self Care | Payer: Medicare Other | Source: Ambulatory Visit | Attending: Internal Medicine | Admitting: Internal Medicine

## 2015-04-11 DIAGNOSIS — E2839 Other primary ovarian failure: Secondary | ICD-10-CM

## 2015-05-22 ENCOUNTER — Emergency Department (INDEPENDENT_AMBULATORY_CARE_PROVIDER_SITE_OTHER): Payer: Medicare Other

## 2015-05-22 ENCOUNTER — Encounter (HOSPITAL_COMMUNITY): Payer: Self-pay | Admitting: Emergency Medicine

## 2015-05-22 ENCOUNTER — Emergency Department (HOSPITAL_COMMUNITY)
Admission: EM | Admit: 2015-05-22 | Discharge: 2015-05-22 | Disposition: A | Discharge status: Home / Self Care | Payer: Medicare Other | Source: Home / Self Care | Attending: Family Medicine | Admitting: Family Medicine

## 2015-05-22 DIAGNOSIS — S92331A Displaced fracture of third metatarsal bone, right foot, initial encounter for closed fracture: Secondary | ICD-10-CM

## 2015-05-22 DIAGNOSIS — S99911A Unspecified injury of right ankle, initial encounter: Secondary | ICD-10-CM

## 2015-05-22 NOTE — ED Notes (Signed)
Patient slipped in her home yesterday.  She had sprayed for insects and stepped on damp floor, causing her to fall.  No blow to head and no loc.  Only complaint today is right ankle and foot pain.  No numbness or tingling in toes.  Patient was wearing tennis shoes when she fell.  Able to move toes

## 2015-05-22 NOTE — ED Provider Notes (Addendum)
CSN: [id number]     Arrival date & time 05/22/15  1332 History   First MD Initiated Contact with Patient 05/22/15 1458     Chief Complaint  Patient presents with  . Ankle Pain  . Foot Pain   (Consider location/radiation/quality/duration/timing/severity/associated sxs/prior Treatment) Patient is a 82 y.o. female presenting with ankle pain and lower extremity pain. The history is provided by the patient and a relative. No language interpreter was used.  Ankle Pain Foot Pain  Patient here with her daughter; presents with complaint of pain in the R ankle, which had onset yesterday afternoon after slipping on wet threshold of her home after spraying with insecticide.  Inversion injury. Was unable to bear weight, was alone at home and crawled inside to the sofa where her daughter found her. Elevation and soaks, wrapped in elastic wrap.  Has been able to ambulate with pronged cane and walker at home; she uses the cane on occasion at home.  No other injuries in the fall.   No prior injury to the R ankle. No history of any fracture.   Past Medical History  Diagnosis Date  . Restless leg syndrome   . Hypertension   . Dyslipidemia   . History of appendectomy   . H/O: hysterectomy   . Cyst of breast, right, benign solitary   . Inguinal hernia   . History of lumbosacral spine surgery   . Arthritis   . HHNC (hyperglycemic hyperosmolar nonketotic coma) ([place]) 05/14/2013  . Abnormality of gait 11/22/2014   Past Surgical History  Procedure Laterality Date  . Appendectomy    . Abdominal hysterectomy    . Laparotomy N/A 03/23/2013    Procedure: EXPLORATORY LAPAROTOMY;  Surgeon: Gwenyth Ober, MD;  Location: [place];  Service: General;  Laterality: N/A;  . Bowel resection N/A 03/23/2013    Procedure: SMALL BOWEL RESECTION;  Surgeon: Gwenyth Ober, MD;  Location: [place];  Service: General;  Laterality: N/A;  . Tracheostomy      feinstein  . Esophagogastroduodenoscopy N/A 04/01/2013    Procedure:  ESOPHAGOGASTRODUODENOSCOPY (EGD);  Surgeon: Gwenyth Ober, MD;  Location: [place];  Service: General;  Laterality: N/A;  . Peg placement N/A 04/01/2013    Procedure: PERCUTANEOUS ENDOSCOPIC GASTROSTOMY (PEG) PLACEMENT;  Surgeon: Gwenyth Ober, MD;  Location: [facility] ENDOSCOPY;  Service: General;  Laterality: N/A;   Family History  Problem Relation Age of Onset  . Heart attack Father   . Lung cancer Brother   . Healthy Sister    Social History  Substance Use Topics  . Smoking status: Former Smoker -- 0.00 packs/day    Types: Cigarettes    Quit date: 02/25/2003  . Smokeless tobacco: Never Used  . Alcohol Use: No     Comment: socailly.   OB History    No data available     Review of Systems  Allergies  Penicillins  Home Medications   Prior to Admission medications   Medication Sig Start Date End Date Taking? Authorizing Provider  amLODipine (NORVASC) 5 MG tablet Take 5 mg by mouth daily. 11/12/14   Historical Provider, MD  aspirin 81 MG tablet 81 mg by Gastric Tube route daily.     Historical Provider, MD  ferrous sulfate 325 (65 FE) MG tablet Take 325 mg by mouth 2 (two) times daily with a meal.    Historical Provider, MD  hydrOXYzine (ATARAX/VISTARIL) 25 MG tablet Take 1 tablet (25 mg total) by mouth daily as needed. 03/05/15  Maryellen Pile, MD  isosorbide mononitrate (IMDUR) 30 MG 24 hr tablet Take 1.5 tablets (45 mg total) by mouth daily. 02/08/15   Maryellen Pile, MD  losartan (COZAAR) 100 MG tablet Take 1 tablet (100 mg total) by mouth daily. 03/24/15   Maryellen Pile, MD  metoprolol succinate (TOPROL-XL) 25 MG 24 hr tablet Take 1 tablet (25 mg total) by mouth daily. 03/28/15   Maryellen Pile, MD  pantoprazole (PROTONIX) 40 MG tablet Take 1 tablet (40 mg total) by mouth daily. 01/10/15 01/10/16  Maryellen Pile, MD  pramipexole (MIRAPEX) 0.5 MG tablet TAKE ONE TABLET BY MOUTH ONCE DAILY 03/27/15   Kathrynn Ducking, MD  vitamin B-12 (CYANOCOBALAMIN) 1000 MCG tablet Take 1 tablet  (1,000 mcg total) by mouth daily. 03/24/15   Maryellen Pile, MD   Meds Ordered and Administered this Visit  Medications - No data to display  BP 149/74 mmHg  Pulse 75  Temp(Src) 98.1 F (36.7 C) (Oral)  Resp 16  SpO2 97% No data found.   Physical Exam  Constitutional: She appears well-developed and well-nourished. No distress.  Musculoskeletal:  Right ankle with mild edema around the ankle, no ecchymosis. Palpable dp pulse noted. Brisk cap refill < 2 seconds.   No joint laxity of R ankle. Tenderness with squeezing medial/lateral malleoli.   No tenderness along the distal fibula with palpation.   Dorsi/plantar flexion of R ankle full and without limitation.   Neurological:  Sensation in R foot intact to touch and temperature.   Skin: She is not diaphoretic.    ED Course  Procedures (including critical care time)  Labs Review Labs Reviewed - No data to display  Imaging Review No results found.   Visual Acuity Review  Right Eye Distance:   Left Eye Distance:   Bilateral Distance:    Right Eye Near:   Left Eye Near:    Bilateral Near:         MDM   1. Right ankle injury, initial encounter    Patient with inversion injury of the R ankle; by exam would appear to be a grade 1 or 2 sprain.  For XR R ankle and foot, films of both reviewed by me.  X-ray of R foot shows nondisplaced subacute distal metatarsal fracture.   No ankle fracture.   Post-op shoe.  Non-weight bearing.   Rest, Ice, Compression, Elevation.  Follow up with Orthopedics as outpatient.   Dalbert Mayotte, MD    Willeen Niece, MD 05/22/15 [place], MD 05/22/15 [phone]

## 2015-05-22 NOTE — Discharge Instructions (Signed)
It is a pleasure to see you today for the injury to your right ankle and foot.    As we discussed, the x-rays show a nondisplaced fracture in the bone at the base of your middle toe of the right foot.    We are putting you in a post-op shoe; it is important that you not bear weight on the right foot until you see the orthopedic doctor in the coming week.   Elevation, ice and compression while at home.

## 2015-05-31 ENCOUNTER — Ambulatory Visit (INDEPENDENT_AMBULATORY_CARE_PROVIDER_SITE_OTHER): Payer: Medicare Other | Admitting: Internal Medicine

## 2015-05-31 ENCOUNTER — Encounter: Payer: Self-pay | Admitting: Internal Medicine

## 2015-05-31 VITALS — BP 172/97 | HR 65 | Temp 98.0°F | Ht 61.0 in | Wt 149.6 lb

## 2015-05-31 DIAGNOSIS — I10 Essential (primary) hypertension: Secondary | ICD-10-CM

## 2015-05-31 DIAGNOSIS — S92334D Nondisplaced fracture of third metatarsal bone, right foot, subsequent encounter for fracture with routine healing: Secondary | ICD-10-CM

## 2015-05-31 DIAGNOSIS — M84374A Stress fracture, right foot, initial encounter for fracture: Secondary | ICD-10-CM | POA: Insufficient documentation

## 2015-05-31 DIAGNOSIS — W010XXD Fall on same level from slipping, tripping and stumbling without subsequent striking against object, subsequent encounter: Secondary | ICD-10-CM

## 2015-05-31 DIAGNOSIS — M84374D Stress fracture, right foot, subsequent encounter for fracture with routine healing: Secondary | ICD-10-CM

## 2015-05-31 MED ORDER — AMLODIPINE BESYLATE 10 MG PO TABS: 10.0000 mg | ORAL_TABLET | Freq: Every day | ORAL | Status: DC

## 2015-05-31 NOTE — Patient Instructions (Signed)
You should continue treating your ankle strain with antiinflammatory medicines as needed, ice, and decreased load on it for the rest of the month. You can walk with your partial boot on but stop if you have pain from standing on it.  Your blood pressure has been consistently high for the past few visits. Today I changed the dose of your amlodipine to '10mg'$  otherwise left your medications the same. We will need to see you again in about 2-4 weeks to see how your foot is healing and how your blood pressure is controlled.

## 2015-05-31 NOTE — Progress Notes (Signed)
Subjective:   Patient ID: Kristi Alexander female   DOB: 1925/03/17 82 y.o.   MRN: [id number]  HPI: [name] is a 82 y.o. woman with PMHx as detailed below presenting for ED follow up after a fall and injury of right ankle. She was cleaning her house and slipped on wet floor with subsequent pain and swelling of her right ankle. She was seen at the ED where xray demonstrated a subacute stress fracture of the 3rd metatarsal and soft tissue swelling of the ankle. She was given antiinflammatory medication and a postsurgical type boot and discharged with recommended follow up at clinic and orthopedic surgery. She has been limiting weightbearing with a wheelchair. Her ankle does not hurt at all when not placing load on it, but hurts to walk. She thinks the swelling is decreased.  See problem based assessment and plan below for additional details.  Past Medical History  Diagnosis Date  . Restless leg syndrome   . Hypertension   . Dyslipidemia   . History of appendectomy   . H/O: hysterectomy   . Cyst of breast, right, benign solitary   . Inguinal hernia   . History of lumbosacral spine surgery   . Arthritis   . HHNC (hyperglycemic hyperosmolar nonketotic coma) ([place]) 05/14/2013  . Abnormality of gait 11/22/2014   Current Outpatient Prescriptions  Medication Sig Dispense Refill  . amLODipine (NORVASC) 5 MG tablet Take 5 mg by mouth daily.    Marland Kitchen aspirin 81 MG tablet 81 mg by Gastric Tube route daily.     . ferrous sulfate 325 (65 FE) MG tablet Take 325 mg by mouth 2 (two) times daily with a meal.    . hydrOXYzine (ATARAX/VISTARIL) 25 MG tablet Take 1 tablet (25 mg total) by mouth daily as needed. 30 tablet 2  . isosorbide mononitrate (IMDUR) 30 MG 24 hr tablet Take 1.5 tablets (45 mg total) by mouth daily. 60 tablet 2  . losartan (COZAAR) 100 MG tablet Take 1 tablet (100 mg total) by mouth daily. 90 tablet 2  . metoprolol succinate (TOPROL-XL) 25 MG 24 hr tablet Take 1 tablet (25 mg  total) by mouth daily. 90 tablet 3  . pantoprazole (PROTONIX) 40 MG tablet Take 1 tablet (40 mg total) by mouth daily. 30 tablet 0  . pramipexole (MIRAPEX) 0.5 MG tablet TAKE ONE TABLET BY MOUTH ONCE DAILY 90 tablet 1  . vitamin B-12 (CYANOCOBALAMIN) 1000 MCG tablet Take 1 tablet (1,000 mcg total) by mouth daily.     No current facility-administered medications for this visit.   Family History  Problem Relation Age of Onset  . Heart attack Father   . Lung cancer Brother   . Healthy Sister    Social History   Social History  . Marital Status: Married    Spouse Name: [name]   . Number of Children: 2  . Years of Education: 12+   Occupational History  . retired    Social History Main Topics  . Smoking status: Former Smoker -- 0.00 packs/day    Types: Cigarettes    Quit date: 02/25/2003  . Smokeless tobacco: Never Used  . Alcohol Use: No     Comment: socailly.  . Drug Use: No  . Sexual Activity: Not Asked   Other Topics Concern  . None   Social History Narrative   Patient is married and lives with her husband. The patient is retired. Patient has 2 children and has a college education.  Patient is right handed.   Patient drinks very little caffeine.   Review of Systems: Review of Systems  Constitutional: Negative for fever.  Cardiovascular: Positive for leg swelling.  Musculoskeletal: Positive for joint pain and falls.  Neurological: Negative for dizziness.  Endo/Heme/Allergies: Does not bruise/bleed easily.    Objective:  Physical Exam: Filed Vitals:   05/31/15 0911  BP: 159/98  Pulse: 67  Temp: 98 F (36.7 C)  TempSrc: Oral  Height: '5\' 1"'$  (1.549 m)  Weight: 149 lb 9.6 oz (67.858 kg)  SpO2: 100%   GENERAL- alert, co-operative, NAD CARDIAC- RRR, no murmurs, rubs or gallops. RESP- CTAB NEURO- 5/5 strength in lower extremities b/l, distal toes sensation intact EXTREMITIES- Prominent lateral swelling of right ankle joint, without erythema or warmth, mildly  tender to palpation and inversion/eversion of the foot SKIN- Warm, dry, No rash or lesion.  Assessment & Plan:

## 2015-06-01 NOTE — Assessment & Plan Note (Signed)
Assessment: Nondisplaced fracture of distal 3rd metatarsal. This is consistent with a subacute stress fracture with minimal risk of instability and should be appropriate to treat supportively. The actual acute injury with pain that caused he ED visit is an ankle strain injury that is already improving. She is currently having no pain when not load bearing and is able to walk with a cane or walker.  Plan: Continue supportive care with ice and NSAIDs PRN Limit weight bearing through end of April Consider PT referral afterwards if her underlying chronic gait problems are worsened after recovery

## 2015-06-01 NOTE — Assessment & Plan Note (Signed)
BP Readings from Last 3 Encounters:  05/31/15 172/97  05/22/15 149/74  03/21/15 169/74    Lab Results  Component Value Date   NA 143 03/21/2015   K 4.2 03/21/2015   CREATININE 0.74 03/21/2015    Assessment: Blood pressure control: Uncontrolled Progress toward BP goal:  Worsened Comments: She is uncontrolled today taking amlodipine '5mg'$ , imdur '30mg'$ , losartan '100mg'$ , and metoprolol '25mg'$ . She insists that she is compliant and can correctly report to me the time of administration she takes each medicine. She is also taking daily NSAIDs due to her anke injury that could be contributing to worsened hypertension today. She has no episodes of headache or lightheadedness on this regimen.  Plan: Medications:  Increase amlodipine to '10mg'$ , decrease PRN NSAID use as ankle injury improves Other plans: RTC in 2-4 weeks for follow up of her hypertension and tolerance of high dose amlodipine (in 82yo pt)

## 2015-06-04 NOTE — Progress Notes (Signed)
Case discussed with Dr. Benjamine Mola at the time of the visit.  We reviewed the resident's history and exam and pertinent patient test results.  I agree with the assessment, diagnosis, and plan of care documented in the resident's note.

## 2015-06-05 ENCOUNTER — Encounter: Payer: Self-pay | Admitting: Adult Health

## 2015-06-05 ENCOUNTER — Ambulatory Visit (INDEPENDENT_AMBULATORY_CARE_PROVIDER_SITE_OTHER): Payer: Medicare Other | Admitting: Adult Health

## 2015-06-05 VITALS — BP 150/64 | HR 74 | Ht 61.0 in | Wt 147.8 lb

## 2015-06-05 DIAGNOSIS — M4806 Spinal stenosis, lumbar region: Secondary | ICD-10-CM

## 2015-06-05 DIAGNOSIS — G2581 Restless legs syndrome: Secondary | ICD-10-CM

## 2015-06-05 DIAGNOSIS — M48061 Spinal stenosis, lumbar region without neurogenic claudication: Secondary | ICD-10-CM

## 2015-06-05 NOTE — Progress Notes (Signed)
I have read the note, and I agree with the clinical assessment and plan.  Arlie Riker KEITH   

## 2015-06-05 NOTE — Progress Notes (Signed)
PATIENT: Kristi Alexander DOB: 03-06-1925  REASON FOR VISIT: follow up- restless leg syndrome, abnormality of gait HISTORY FROM: patient  HISTORY OF PRESENT ILLNESS: Kristi Alexander is an 82 year old female with a history of restless leg syndrome and abnormality of gait. She returns today for follow-up. The patient had a lumbar MRI that indicated spinal stenosis. She underwent a epidural steroid injection she states that she did not gain much benefit from this. She states that currently she has not been focused on her leg discomfort as she injured her right ankle. She states that she sprained her ankle and her doctor recommended that she stay off that foot for 4-6 weeks. Right now she has been dealing with discomfort from the ankle and has not noticed any discomfort in the legs. She feels that Mirapex continues to work well for her restless leg symptoms. She states that she sleeps well at night. She denies any new neurological symptoms. She returns today for an evaluation.  HISTORY 11/22/14 (WILLIS): Kristi Alexander is an 82 year old right-handed black female with a history of restless leg syndrome. The patient had significant anemia previously, this has been corrected, and the patient indicates that the restless leg symptoms have improved. Within the last 4 weeks or so, she has noted some sensation of stiffness in the legs that is worse in the morning, better as the day goes on. She feels as if her balance has been altered, she has not had any falls, but she uses a quad cane for ambulation. She reports no true muscle cramps or discomfort in the legs, she denies any significant neck or back pain or difficulty controlling the bowels or the bladder. She indicates that when she raises one arm or the other, she may feel a pulling sensation in the leg. She is brought into the office today for further evaluation.  REVIEW OF SYSTEMS: Out of a complete 14 system review of symptoms, the patient complains only of the  following symptoms, and all other reviewed systems are negative.  Restless leg  ALLERGIES: Allergies  Allergen Reactions  . Penicillins Swelling    HOME MEDICATIONS: Outpatient Prescriptions Prior to Visit  Medication Sig Dispense Refill  . amLODipine (NORVASC) 10 MG tablet Take 1 tablet (10 mg total) by mouth daily. 30 tablet 1  . aspirin 81 MG tablet 81 mg by Gastric Tube route daily.     . ferrous sulfate 325 (65 FE) MG tablet Take 325 mg by mouth 2 (two) times daily with a meal.    . hydrOXYzine (ATARAX/VISTARIL) 25 MG tablet Take 1 tablet (25 mg total) by mouth daily as needed. 30 tablet 2  . isosorbide mononitrate (IMDUR) 30 MG 24 hr tablet Take 1.5 tablets (45 mg total) by mouth daily. 60 tablet 2  . losartan (COZAAR) 100 MG tablet Take 1 tablet (100 mg total) by mouth daily. 90 tablet 2  . metoprolol succinate (TOPROL-XL) 25 MG 24 hr tablet Take 1 tablet (25 mg total) by mouth daily. 90 tablet 3  . pantoprazole (PROTONIX) 40 MG tablet Take 1 tablet (40 mg total) by mouth daily. 30 tablet 0  . pramipexole (MIRAPEX) 0.5 MG tablet TAKE ONE TABLET BY MOUTH ONCE DAILY 90 tablet 1  . vitamin B-12 (CYANOCOBALAMIN) 1000 MCG tablet Take 1 tablet (1,000 mcg total) by mouth daily.     No facility-administered medications prior to visit.    PAST MEDICAL HISTORY: Past Medical History  Diagnosis Date  . Restless leg syndrome   .  Hypertension   . Dyslipidemia   . History of appendectomy   . H/O: hysterectomy   . Cyst of breast, right, benign solitary   . Inguinal hernia   . History of lumbosacral spine surgery   . Arthritis   . HHNC (hyperglycemic hyperosmolar nonketotic coma) ([place]) 05/14/2013  . Abnormality of gait 11/22/2014    PAST SURGICAL HISTORY: Past Surgical History  Procedure Laterality Date  . Appendectomy    . Abdominal hysterectomy    . Laparotomy N/A 03/23/2013    Procedure: EXPLORATORY LAPAROTOMY;  Surgeon: Gwenyth Ober, MD;  Location: [facility];  Service: General;   Laterality: N/A;  . Bowel resection N/A 03/23/2013    Procedure: SMALL BOWEL RESECTION;  Surgeon: Gwenyth Ober, MD;  Location: [facility];  Service: General;  Laterality: N/A;  . Tracheostomy      feinstein  . Esophagogastroduodenoscopy N/A 04/01/2013    Procedure: ESOPHAGOGASTRODUODENOSCOPY (EGD);  Surgeon: Gwenyth Ober, MD;  Location: [facility];  Service: General;  Laterality: N/A;  . Peg placement N/A 04/01/2013    Procedure: PERCUTANEOUS ENDOSCOPIC GASTROSTOMY (PEG) PLACEMENT;  Surgeon: Gwenyth Ober, MD;  Location: [facility] END[facility]COPY;  Service: General;  Laterality: N/A;    FAMILY HISTORY: Family History  Problem Relation Age of Onset  . Heart attack Father   . Lung cancer Brother   . Healthy Sister     SOCIAL HISTORY: Social History   Social History  . Marital Status: Married    Spouse Name: [name]   . Number of Children: 2  . Years of Education: 12+   Occupational History  . retired    Social History Main Topics  . Smoking status: Former Smoker -- 0.00 packs/day    Types: Cigarettes    Quit date: 02/25/2003  . Smokeless tobacco: Never Used  . Alcohol Use: No     Comment: socailly.  . Drug Use: No  . Sexual Activity: Not on file   Other Topics Concern  . Not on file   Social History Narrative   Patient is married and lives with her husband. The patient is retired. Patient has 2 children and has a college education.    Patient is right handed.   Patient drinks very little caffeine.      PHYSICAL EXAM  Filed Vitals:   06/05/15 1326  BP: 150/64  Pulse: 74  Height: '5\' 1"'$  (1.549 m)  Weight: 147 lb 12.8 oz (67.042 kg)   Body mass index is 27.94 kg/(m^2).  Generalized: Well developed, in no acute distress   Neurological examination  Mentation: Alert oriented to time, place, history taking. Follows all commands speech and language fluent Cranial nerve II-XII: Pupils were equal round reactive to light. Extraocular movements were full, visual field were full on  confrontational test. Facial sensation and strength were normal. Uvula tongue midline. Head turning and shoulder shrug  were normal and symmetric. Motor: The motor testing reveals 5 over 5 strength of all 4 extremities. Good symmetric motor tone is noted throughout.  Sensory: Sensory testing is intact to soft touch on all 4 extremities. No evidence of extinction is noted.  Coordination: Cerebellar testing reveals good finger-nose-finger and heel-to-shin bilaterally.  Gait and station: Gait is unsteady. She walks with a cane. Has a small boot on the right. Tandem gait not attempted Reflexes: Deep tendon reflexes are symmetric and normal bilaterally.   DIAGNOSTIC DATA (LABS, IMAGING, TESTING) - I reviewed patient records, labs, notes, testing and imaging myself where available.  Lab  Results  Component Value Date   WBC 5.9 10/24/2014   HGB 12.0 09/05/2014   HCT 37.3 10/24/2014   MCV 89 10/24/2014   PLT 224 10/24/2014      Component Value Date/Time   NA 143 03/21/2015 1627   NA 140 06/04/2014 0350   K 4.2 03/21/2015 1627   CL 104 03/21/2015 1627   CO2 23 03/21/2015 1627   GLUCOSE 103* 03/21/2015 1627   GLUCOSE 104* 06/04/2014 0350   BUN 15 03/21/2015 1627   BUN 18 06/04/2014 0350   CREATININE 0.74 03/21/2015 1627   CALCIUM 9.4 03/21/2015 1627   PROT 5.9* 06/04/2014 0350   ALBUMIN 2.9* 06/04/2014 0350   AST 23 06/04/2014 0350   ALT 32 06/04/2014 0350   ALKPHOS 89 06/04/2014 0350   BILITOT 1.0 06/04/2014 0350   GFRNONAA 73 03/21/2015 1627   GFRAA 84 03/21/2015 1627    Lab Results  Component Value Date   HGBA1C 6.7 12/05/2014   Lab Results  Component Value Date   WIOMBTDH74 163 12/05/2014   Lab Results  Component Value Date   TSH 1.873 06/02/2014      ASSESSMENT AND PLAN 82 y.o. year old female  has a past medical history of Restless leg syndrome; Hypertension; Dyslipidemia; History of appendectomy; H/O: hysterectomy; Cyst of breast, right, benign solitary; Inguinal  hernia; History of lumbosacral spine surgery; Arthritis; [place] (hyperglycemic hyperosmolar nonketotic coma) ([place]) (05/14/2013); and Abnormality of gait (11/22/2014). here with:  1. Restless leg syndrome 2. Lumbar stenosis  The patient will continue on Mirapex for restless leg syndrome. Patient has been advised that once her ankle heals if she begins to have any discomfort in the legs we can consider physical therapy at that time. She verbalized understanding. She will follow-up in 6 months or sooner if needed.    Ward Givens, MSN, NP-C 06/05/2015, 1:31 PM [facility] [address] [place], [place] [address] [phone]

## 2015-06-05 NOTE — Patient Instructions (Signed)
Continue Mirapex Consider physical therapy once ankle heals.  If your symptoms worsen or you develop new symptoms please let us know.

## 2015-06-18 ENCOUNTER — Other Ambulatory Visit: Payer: Self-pay | Admitting: Internal Medicine

## 2015-06-20 NOTE — Telephone Encounter (Signed)
Patient already had future appt scheduled for 06-28-15 @ 10:45 am with Dr. Charlott Rakes.

## 2015-06-28 ENCOUNTER — Ambulatory Visit (INDEPENDENT_AMBULATORY_CARE_PROVIDER_SITE_OTHER): Payer: Medicare Other | Admitting: Internal Medicine

## 2015-06-28 ENCOUNTER — Encounter: Payer: Self-pay | Admitting: Internal Medicine

## 2015-06-28 VITALS — BP 158/74 | HR 66 | Temp 97.9°F | Ht 61.0 in | Wt 142.9 lb

## 2015-06-28 DIAGNOSIS — X58XXXD Exposure to other specified factors, subsequent encounter: Secondary | ICD-10-CM | POA: Diagnosis not present

## 2015-06-28 DIAGNOSIS — I10 Essential (primary) hypertension: Secondary | ICD-10-CM

## 2015-06-28 DIAGNOSIS — M84374D Stress fracture, right foot, subsequent encounter for fracture with routine healing: Secondary | ICD-10-CM

## 2015-06-28 NOTE — Assessment & Plan Note (Signed)
Overview Since her last visit, she has reported using her right foot less s instructed her last office visit. She also reports improvement tenderness and swelling.  There her pain was initially 8/10, is improved force of 5/10. She has treated her pain using 1 tablet of Aleve twice daily with meals, and denies any symptoms of NSAID-induced gastritis, like abdominal pain, nausea, vomiting. She also has been icing the joint, soaking it in Epsom salts, and keeping it elevated. She denies prior history of injury to the right foot though cannot recall when she had surgery for "bone spurs."  Assessment Metatarsal stress fracture the right foot with continued signs of healing. Physical exam is still notable for gait abnormality.  Plan -Order home health physical therapy -Continue current management with Aleve 1 pill twice daily as needed for pain, icing, elevating, soaking in Epson salt

## 2015-06-28 NOTE — Patient Instructions (Signed)
We will call you about the home physical therapy.  For your blood pressure, I'm going to send a message Dr. Charlynn Grimes to look into your medications.

## 2015-06-28 NOTE — Assessment & Plan Note (Addendum)
Overview Her initial blood pressure today is 183/76. She is able to recall the for medication she takes at home: Amlodipine 10 mg, Imdur 45 mg, losartan 100 mg, metoprolol succinate 25 mg. She reports taking these medications this morning. She denies prior heart attack or heart failure though does a knowledge she was hospitalized and was started on Imdur by Dr. Terrence Dupont due to "heart beating slow." She does acknowledge taking 1 tablet of Aleve twice daily for her joint pain.  Assessment Hypertension, previously well controlled on therapy. Repeat BP improved to 158/74. She is still slightly above goal 150/90 probably due to NSAID use and pain. Unclear of the indication for Imdur.   Plan -Continue current therapy: Amlodipine 10 mg, Imdur 45 mg daily, losartan 100 mg daily, metoprolol succinate 25 mg daily -Route message to PCP to clarify why this lady is on Imdur in the absence of coronary artery disease or heart failure with reduced ejection fraction

## 2015-06-28 NOTE — Progress Notes (Signed)
Internal Medicine Clinic Attending  Case discussed with Dr. Patel,Rushil at the time of the visit.  We reviewed the resident's history and exam and pertinent patient test results.  I agree with the assessment, diagnosis, and plan of care documented in the resident's note.  

## 2015-06-28 NOTE — Progress Notes (Signed)
   Subjective:    Patient ID: Kristi Alexander, female    DOB: 25-Nov-1925, 82 y.o.   MRN: [id number]  HPI Kristi Alexander is a 82 year old female with hypertension, chronic diarrhea, anemia who presents today for right foot pain. Please see assessment & plan for status of chronic medical problems.    Review of Systems  Respiratory: Negative for shortness of breath.   Cardiovascular: Negative for chest pain and leg swelling.  Gastrointestinal: Negative for nausea, abdominal pain and diarrhea.  Musculoskeletal: Positive for joint swelling, arthralgias and gait problem.       Objective:   Physical Exam  Constitutional: She is oriented to person, place, and time. She appears well-developed and well-nourished. No distress.  HENT:  Head: Normocephalic and atraumatic.  Eyes: Conjunctivae are normal. No scleral icterus.  Neck: No tracheal deviation present.  Cardiovascular: Normal rate and regular rhythm.   Pulmonary/Chest: Effort normal and breath sounds normal. No stridor.  Musculoskeletal:  Swelling noted around the lateral and medial malleoli of the right foot which is mildly tender to palpation as compared to left. Decreased range of motion with regards to extension/flexion of the right foot as compared to the left.  Neurological: She is alert and oriented to person, place, and time.  Gait abnormal for shifting of weight to the left foot with ambulation as compared to the right.  Skin: Skin is warm and dry. She is not diaphoretic.          Assessment & Plan:

## 2015-07-09 ENCOUNTER — Telehealth: Payer: Self-pay | Admitting: *Deleted

## 2015-07-09 NOTE — Telephone Encounter (Signed)
Requesting verbal order for PT two times per week for 6 weeks based on initial PT evaluation.

## 2015-07-09 NOTE — Telephone Encounter (Signed)
Agreed.  Thanks.  

## 2015-07-09 NOTE — Telephone Encounter (Signed)
Verbal order given for physical therapy two times per week for 6 weeks.

## 2015-07-20 ENCOUNTER — Other Ambulatory Visit: Payer: Self-pay | Admitting: Internal Medicine

## 2015-07-20 NOTE — Telephone Encounter (Signed)
Refill Imdur today. Not clear why she is on this medication. Will clarify at next appointment and consider stopping and changing antihypertensive.

## 2015-07-24 ENCOUNTER — Other Ambulatory Visit: Payer: Self-pay | Admitting: Internal Medicine

## 2015-07-25 ENCOUNTER — Other Ambulatory Visit: Payer: Self-pay | Admitting: Internal Medicine

## 2015-08-15 ENCOUNTER — Encounter: Payer: Self-pay | Admitting: Internal Medicine

## 2015-08-15 ENCOUNTER — Ambulatory Visit (INDEPENDENT_AMBULATORY_CARE_PROVIDER_SITE_OTHER): Payer: Medicare Other | Admitting: Internal Medicine

## 2015-08-15 VITALS — BP 154/65 | HR 64 | Temp 98.1°F | Ht 61.0 in | Wt 146.0 lb

## 2015-08-15 DIAGNOSIS — I10 Essential (primary) hypertension: Secondary | ICD-10-CM | POA: Diagnosis not present

## 2015-08-15 DIAGNOSIS — W010XXD Fall on same level from slipping, tripping and stumbling without subsequent striking against object, subsequent encounter: Secondary | ICD-10-CM

## 2015-08-15 DIAGNOSIS — M84374D Stress fracture, right foot, subsequent encounter for fracture with routine healing: Secondary | ICD-10-CM | POA: Diagnosis not present

## 2015-08-15 NOTE — Patient Instructions (Signed)
General Instructions: - Please check your blood pressure in the morning when you get up and record the number on your log for the next 2 weeks - Follow up in 2 weeks to reassess blood pressure - I'm glad your foot is doing better. Complete physical therapy and follow their recommendations.  Thank you for bringing your medicines today. This helps [place] keep you safe from mistakes.   Progress Toward Treatment Goals:  No flowsheet data found.  Self Care Goals & Plans:  Self Care Goal 06/28/2015  Manage my medications take my medicines as prescribed; bring my medications to every visit; refill my medications on time  Eat healthy foods drink diet soda or water instead of juice or soda; eat more vegetables; eat foods that are low in salt; eat baked foods instead of fried foods; eat fruit for snacks and desserts  Meeting treatment goals maintain the current self-care plan    No flowsheet data found.   Care Management & Community Referrals:  No flowsheet data found.

## 2015-08-16 NOTE — Assessment & Plan Note (Signed)
Her foot appears to be healing well and she is weight-bearing on the foot without any issues. Last day of PT on 6/22. Encouraged her to ask PT what further recommendations she needs. Family was asking if she needed to see an orthopedist and I explained that was not necessary given her foot is healing fine. I did not get a chance to discuss with the patient, but she would benefit from starting a bisphosphonate, such as Alendronate, given her hx of osteopenia and now with this fracture s/p fall technically gives her the diagnosis of osteoporosis. Will send a message to her PCP to consider discussing this treatment next visit.

## 2015-08-16 NOTE — Progress Notes (Signed)
   Subjective:    Patient ID: Jamie Brookes, female    DOB: 07-06-1925, 82 y.o.   MRN: [id number]  HPI Ms. Rayborn is an 82yo woman with PMHx of HTN, chronic diastolic heart failure, and osteopenia who presents today for follow up of her hypertension.  HTN: BP elevated today at 184/57. She reports at home her BPs are in the 938B-017P systolic. She states physical therapy has been checking her BPs as well and are usually in this same range. She is taking Amlodipine 10 mg daily, Losartan 1000 mg daily, and Toprol-XL 25 mg daily.   Right Foot Fracture: She underwent a fall on 05/22/15 after slipping on a wet surface at home and was evaluated in the ED. She was found to have a subacute stress fracture of the 3rd metatarsal. Since that time she has been working with PT and was initially non-weightbearing on her right foot through the end of April. She states she is now able to walk on her foot without any pain. She notes the swelling has significantly decreased. Her last PT session is on 6/22.    Review of Systems General: Denies fever, chills, night sweats, changes in weight, changes in appetite HEENT: Denies headaches, ear pain, changes in vision, rhinorrhea, sore throat CV: Denies CP, palpitations, SOB, orthopnea Pulm: Denies SOB, cough, wheezing GI: Denies abdominal pain, nausea, vomiting, diarrhea, constipation, melena, hematochezia GU: Denies dysuria, hematuria, frequency Msk: Denies muscle cramps Neuro: Denies weakness, numbness, tingling Skin: Denies rashes, bruising Psych: Denies depression, anxiety, hallucinations    Objective:   Physical Exam General: alert, sitting up, NAD HEENT: [place]/AT, EOMI, sclera anicteric, mucus membranes moist CV: RRR, no m/g/r Pulm: CTA bilaterally, breaths non-labored Abd: BS+, soft, non-tender Ext: warm, no peripheral edema. Right foot in open toed boot, no significant swelling. Patient able to stand without difficulties.  Neuro: alert and oriented x 3    Assessment & Plan:  Please refer to A&P documentation.

## 2015-08-16 NOTE — Assessment & Plan Note (Signed)
BP significantly elevated here. Given she reports normotensive blood pressures at home, I have asked her to record her morning BPs for the next 2 weeks and then return for a BP recheck. Will continue her current regimen of Amlodipine 10 mg daily, Losartan 100 mg daily, and Toprol-XL 25 mg daily.

## 2015-08-17 NOTE — Progress Notes (Signed)
Internal Medicine Clinic Attending  Case discussed with Dr. Rivet soon after the resident saw the patient.  We reviewed the resident's history and exam and pertinent patient test results.  I agree with the assessment, diagnosis, and plan of care documented in the resident's note.  

## 2015-08-29 ENCOUNTER — Other Ambulatory Visit: Payer: Self-pay | Admitting: Internal Medicine

## 2015-09-03 NOTE — Telephone Encounter (Signed)
Based on last PCP visit, Dr. Charlynn Grimes had asked about PPI and patient stated she was not taking.  Will refill X 1 month.  She needs follow with PCP if possible to discuss further.

## 2015-09-03 NOTE — Telephone Encounter (Signed)
Received call from pt's dtr checking on the status of her mom's protonix refill.  Pcp has not addresed refill, will send request to attending pool for review, please advise.Kristi Alexander, [name]7/10/201711:20 AM

## 2015-09-04 NOTE — Telephone Encounter (Signed)
Will address at next visit on 7/19. Thank you.

## 2015-09-05 ENCOUNTER — Other Ambulatory Visit: Payer: Self-pay | Admitting: Internal Medicine

## 2015-09-10 ENCOUNTER — Other Ambulatory Visit: Payer: Self-pay | Admitting: Internal Medicine

## 2015-09-10 NOTE — Telephone Encounter (Signed)
Called pharm, will be ready in 45 min, informed pt

## 2015-09-10 NOTE — Telephone Encounter (Signed)
amLODipine (NORVASC) 10 MG tablet [facility] [employer]

## 2015-09-12 ENCOUNTER — Ambulatory Visit (INDEPENDENT_AMBULATORY_CARE_PROVIDER_SITE_OTHER): Payer: Medicare Other | Admitting: Internal Medicine

## 2015-09-12 ENCOUNTER — Encounter: Payer: Self-pay | Admitting: Internal Medicine

## 2015-09-12 VITALS — BP 162/70 | HR 76 | Temp 98.3°F | Ht 61.0 in | Wt 143.3 lb

## 2015-09-12 DIAGNOSIS — M791 Myalgia, unspecified site: Secondary | ICD-10-CM

## 2015-09-12 DIAGNOSIS — M80071D Age-related osteoporosis with current pathological fracture, right ankle and foot, subsequent encounter for fracture with routine healing: Secondary | ICD-10-CM

## 2015-09-12 DIAGNOSIS — Z87891 Personal history of nicotine dependence: Secondary | ICD-10-CM

## 2015-09-12 DIAGNOSIS — Z79899 Other long term (current) drug therapy: Secondary | ICD-10-CM | POA: Diagnosis not present

## 2015-09-12 DIAGNOSIS — M810 Age-related osteoporosis without current pathological fracture: Secondary | ICD-10-CM

## 2015-09-12 DIAGNOSIS — M8580 Other specified disorders of bone density and structure, unspecified site: Secondary | ICD-10-CM

## 2015-09-12 DIAGNOSIS — I10 Essential (primary) hypertension: Secondary | ICD-10-CM | POA: Diagnosis not present

## 2015-09-12 DIAGNOSIS — M84374D Stress fracture, right foot, subsequent encounter for fracture with routine healing: Secondary | ICD-10-CM

## 2015-09-12 DIAGNOSIS — M19012 Primary osteoarthritis, left shoulder: Secondary | ICD-10-CM

## 2015-09-12 DIAGNOSIS — M25512 Pain in left shoulder: Secondary | ICD-10-CM

## 2015-09-12 MED ORDER — ALENDRONATE SODIUM 70 MG PO TABS: 70.0000 mg | ORAL_TABLET | ORAL | Status: DC

## 2015-09-12 NOTE — Patient Instructions (Addendum)
Thank you for coming in today  I am going to give you a new medication today to help with bone health. It is called Alendronate. It is a once a week pill. It is important to take it with a glass of water and to keep upright (sitting/standing) for an hour after taking the pill.   I am going to get you a referral to outpatient physical therapy to continue working for that leg pain.    I would like to see you back in 2 months for follow up.  Shoulder Injection  A shoulder injection is a procedure to get medicine into your shoulder joint. Your health care provider puts a needle into the joint and injects medicine with an attached syringe. The injected medicine may relieve the pain, swelling, and stiffness of arthritis. The injected medicine may also help to lubricate and cushion your shoulder joint. You may need more than one injection. LET [facility] CARE PROVIDER KNOW ABOUT:  Any allergies you have.  All medicines you are taking, including vitamins, herbs, eye drops, creams, and over-the-counter medicines.  Previous problems you or members of your family have had with the use of anesthetics.  Any blood disorders you have.  Previous surgeries you have had.  Any medical conditions you may have. RISKS AND COMPLICATIONS Generally, this is a safe procedure. However, problems may occur, including:  Infection.  Bleeding.  Worsening symptoms.  Damage to the area around your knee.  Allergic reaction to any of the medicines.  Skin reactions from repeated injections.  AFTER THE PROCEDURE  You may have to move your shoulder through its full range of motion. This helps to get all of the medicine into your joint space.  Your blood pressure, heart rate, breathing rate, and blood oxygen level will be monitored often until the medicines you were given have worn off.  You will be watched to make sure that you do not have a reaction to the injected medicine.   This information is not  intended to replace advice given to you by your health care provider. Make sure you discuss any questions you have with your health care provider.   Document Released: 05/04/2006 Document Revised: 03/03/2014 Document Reviewed: 12/21/2013 Elsevier Interactive Patient Education 2016 [employer].    Alendronate tablets What is this medicine? ALENDRONATE (a LEN droe nate) slows calcium loss from bones. It helps to make normal healthy bone and to slow bone loss in people with Paget's disease and osteoporosis. It may be used in others at risk for bone loss. This medicine may be used for other purposes; ask your health care provider or pharmacist if you have questions. What should I tell my health care provider before I take this medicine? They need to know if you have any of these conditions: -dental disease -esophagus, stomach, or intestine problems, like acid reflux or GERD -kidney disease -low blood calcium -low vitamin D -problems sitting or standing 30 minutes -trouble swallowing -an unusual or allergic reaction to alendronate, other medicines, foods, dyes, or preservatives -pregnant or trying to get pregnant -breast-feeding How should I use this medicine? You must take this medicine exactly as directed or you will lower the amount of the medicine you absorb into your body or you may cause yourself harm. Take this medicine by mouth first thing in the morning, after you are up for the day. Do not eat or drink anything before you take your medicine. Swallow the tablet with a full glass (6 to 8  fluid ounces) of plain water. Do not take this medicine with any other drink. Do not chew or crush the tablet. After taking this medicine, do not eat breakfast, drink, or take any medicines or vitamins for at least 30 minutes. Sit or stand up for at least 30 minutes after you take this medicine; do not lie down. Do not take your medicine more often than directed. Talk to your pediatrician regarding the  use of this medicine in children. Special care may be needed. Overdosage: If you think you have taken too much of this medicine contact a poison control center or emergency room at once. NOTE: This medicine is only for you. Do not share this medicine with others. What if I miss a dose? If you miss a dose, do not take it later in the day. Continue your normal schedule starting the next morning. Do not take double or extra doses. What may interact with this medicine? -aluminum hydroxide -antacids -aspirin -calcium supplements -drugs for inflammation like ibuprofen, naproxen, and others -iron supplements -magnesium supplements -vitamins with minerals This list may not describe all possible interactions. Give your health care provider a list of all the medicines, herbs, non-prescription drugs, or dietary supplements you use. Also tell them if you smoke, drink alcohol, or use illegal drugs. Some items may interact with your medicine. What should I watch for while using this medicine? Visit your doctor or health care professional for regular checks ups. It may be some time before you see benefit from this medicine. Do not stop taking your medicine except on your doctor's advice. Your doctor or health care professional may order blood tests and other tests to see how you are doing. You should make sure you get enough calcium and vitamin D while you are taking this medicine, unless your doctor tells you not to. Discuss the foods you eat and the vitamins you take with your health care professional. Some people who take this medicine have severe bone, joint, and/or muscle pain. This medicine may also increase your risk for a broken thigh bone. Tell your doctor right away if you have pain in your upper leg or groin. Tell your doctor if you have any pain that does not go away or that gets worse. This medicine can make you more sensitive to the sun. If you get a rash while taking this medicine, sunlight may  cause the rash to get worse. Keep out of the sun. If you cannot avoid being in the sun, wear protective clothing and use sunscreen. Do not use sun lamps or tanning beds/booths. What side effects may I notice from receiving this medicine? Side effects that you should report to your doctor or health care professional as soon as possible: -allergic reactions like skin rash, itching or hives, swelling of the face, lips, or tongue -black or tarry stools -bone, muscle or joint pain -changes in vision -chest pain -heartburn or stomach pain -jaw pain, especially after dental work -pain or trouble when swallowing -redness, blistering, peeling or loosening of the skin, including inside the mouth Side effects that usually do not require medical attention (report to your doctor or health care professional if they continue or are bothersome): -changes in taste -diarrhea or constipation -eye pain or itching -headache -nausea or vomiting -stomach gas or fullness This list may not describe all possible side effects. Call your doctor for medical advice about side effects. You may report side effects to FDA at 1-800-FDA-1088. Where should I keep my medicine? Keep  out of the reach of children. Store at room temperature of 15 and 30 degrees C (59 and 86 degrees F). Throw away any unused medicine after the expiration date. NOTE: This sheet is a summary. It may not cover all possible information. If you have questions about this medicine, talk to your doctor, pharmacist, or health care provider.    2016, Elsevier/Gold Standard. (2010-08-09 08:56:09)

## 2015-09-13 LAB — VITAMIN D 25 HYDROXY (VIT D DEFICIENCY, FRACTURES): Vit D, 25-Hydroxy: 18.1 ng/mL — ABNORMAL LOW (ref 30.0–100.0)

## 2015-09-16 DIAGNOSIS — M8580 Other specified disorders of bone density and structure, unspecified site: Secondary | ICD-10-CM | POA: Insufficient documentation

## 2015-09-16 DIAGNOSIS — M25519 Pain in unspecified shoulder: Secondary | ICD-10-CM | POA: Insufficient documentation

## 2015-09-16 MED ORDER — VITAMIN D (CHOLECALCIFEROL) 25 MCG (1000 UT) PO CAPS: 1000.0000 [IU] | ORAL_CAPSULE | Freq: Every day | ORAL | 0 refills | Status: DC

## 2015-09-16 NOTE — Assessment & Plan Note (Addendum)
Patient with recent fragility fracture.   Plan Will initiate bisphonate thearpy Vitamin D level low, start on supplementation. Recheck level in 3 months.

## 2015-09-16 NOTE — Assessment & Plan Note (Signed)
Foot appears to be healing well. Reports finishing HH PT a few weeks ago. States she has had more pain and difficulty with ambulation since stopping PT.  Plan Referral for outpatient PT

## 2015-09-16 NOTE — Progress Notes (Signed)
   CC: HTN/Foot pain  HPI:  [name] is a 82 y.o. female with a past medical history listed below here today for follow up of her HTN and foot pain following metatarsal fracture  For details of today's visit and the status of her chronic medical issues please refer to the assessment and plan.   Past Medical History:  Diagnosis Date  . Abnormality of gait 11/22/2014  . Arthritis   . Cyst of breast, right, benign solitary   . Dyslipidemia   . H/O: hysterectomy   . HHNC (hyperglycemic hyperosmolar nonketotic coma) ([place]) 05/14/2013  . History of appendectomy   . History of lumbosacral spine surgery   . Hypertension   . Inguinal hernia   . Restless leg syndrome     Review of Systems:   General: Denies fever, chills, night sweats, changes in weight, changes in appetite HEENT: Denies headaches, ear pain, changes in vision, rhinorrhea, sore throat CV: Denies CP, palpitations, SOB, orthopnea Pulm: Denies SOB, cough, wheezing GI: Denies abdominal pain, nausea, vomiting, diarrhea, constipation, melena, hematochezia GU: Denies dysuria, hematuria, frequency Msk: Denies muscle cramps Neuro: Denies weakness, numbness, tingling Skin: Denies rashes, bruising Psych: Denies depression, anxiety, hallucinations  Physical Exam:  Vitals:   09/12/15 1609  BP: (!) 162/70  Pulse: 76  Temp: 98.3 F (36.8 C)  TempSrc: Oral  SpO2: 100%  Weight: 143 lb 4.8 oz (65 kg)  Height: '5\' 1"'$  (1.549 m)   General: alert, sitting up, NAD HEENT: [place]/AT, EOMI, sclera anicteric, mucus membranes moist CV: RRR, no m/g/r Pulm: CTA bilaterally, breaths non-labored Abd: BS+, soft, non-tender Ext: warm, no peripheral edema. Right foot in open toed boot, no significant swelling. Patient able to stand without difficulties.  Neuro: alert and oriented x 3  Assessment & Plan:   See encounters tab for problem based medical decision making.   Patient seen with Dr. Angelia Mould

## 2015-09-16 NOTE — Assessment & Plan Note (Signed)
Patient reports complaints of myalgias. States she was recent started back on a statin (unable to recall name) by Dr. Ophelia Charter and symptoms have returned since starting it. She reports she has stopped taking it the past several days.  Plan  Will check a Vit D level as this can exacerbate statin induced myalgias. Vit D level low, will start therapy.

## 2015-09-16 NOTE — Assessment & Plan Note (Signed)
Patient reports having pain in her left shoulder over the past few weeks. She states she has had problems with arthritis in this shoulder for years but has become more problematic recently. Describes it as aching pain. Reports difficulty raising her arm over her head due to stiffness and pain. Says she has had steroid injections in the past which have resolved her pain. Pian somewhat relieved by Ibuprofen.   On exam today she has full range of motion on passive movement. Active ange of motion limited due to pain. Normal strength.   Plan  Shoulder Injection Procedure Note  Pre-operative Diagnosis: left shoulder arthritis  Indications: Symptom relief from osteoarthritis  Anesthesia: Lidocaine 1% without epinephrine without added sodium bicarbonate  Procedure Details   Verbal consent was obtained for the procedure. The shoulder was prepped with iodine. Using a 27 gauge needle the left shoulder was injected with 1 mL 1% lidocaine and 3 mL of triamcinolone (KENALOG) '40mg'$ /ml. The injection site was cleansed with topical isopropyl alcohol and a dressing was applied.  Complications:  None; patient tolerated the procedure well.

## 2015-09-16 NOTE — Assessment & Plan Note (Signed)
BP Readings from Last 3 Encounters:  09/12/15 (!) 162/70  08/15/15 (!) 154/65  06/28/15 (!) 158/74    Lab Results  Component Value Date   NA 143 03/21/2015   K 4.2 03/21/2015   CREATININE 0.74 03/21/2015    Assessment: BP remains elevate here despite extensive home regimen. Amlodipine 10 mg, Imdur 45 mg, losartan 100 mg, metoprolol succinate 25 mg. She did not bring her BP log with her today but reports her BP usually runs 130s/70s at home. Denies any headaches, blurry vision, N/V or dizziness.   She is seen by Dr. Terrence Dupont outpatient who she reports initiated the imdur. Unclear for what reason.   Plan: Will continue her current regimen today as she reports normal BPs at home. Consider starting spironolactone if BP remains uncontrolled. Asked to keep BP log and bring it to next viist

## 2015-09-17 NOTE — Progress Notes (Signed)
Internal Medicine Clinic Attending  I saw and evaluated the patient.  I personally confirmed the key portions of the history and exam documented by Dr. Charlynn Grimes and I reviewed pertinent patient test results.  The assessment, diagnosis, and plan were formulated together and I agree with the documentation in the resident's note. I was present for the entire procedure.

## 2015-10-02 ENCOUNTER — Other Ambulatory Visit: Payer: Self-pay | Admitting: Neurology

## 2015-10-02 ENCOUNTER — Other Ambulatory Visit: Payer: Self-pay | Admitting: Internal Medicine

## 2015-10-02 NOTE — Telephone Encounter (Signed)
pramipexole (MIRAPEX) 0.5 MG tablet  [facility] [address]

## 2015-10-03 NOTE — Telephone Encounter (Signed)
Contacted pharmacy who verified that the RX had been refilled by Dr. Jannifer Franklin and was ready for pick up. Informed patient.

## 2015-10-15 IMAGING — CR DG CHEST 1V PORT
1 series · 1 of 1 positions shown · non-contrast
Comparison: DG CHEST 1V PORT dated 11/15/2009

CLINICAL DATA: ET tube placement

EXAM:
PORTABLE CHEST - 1 VIEW

[AP]
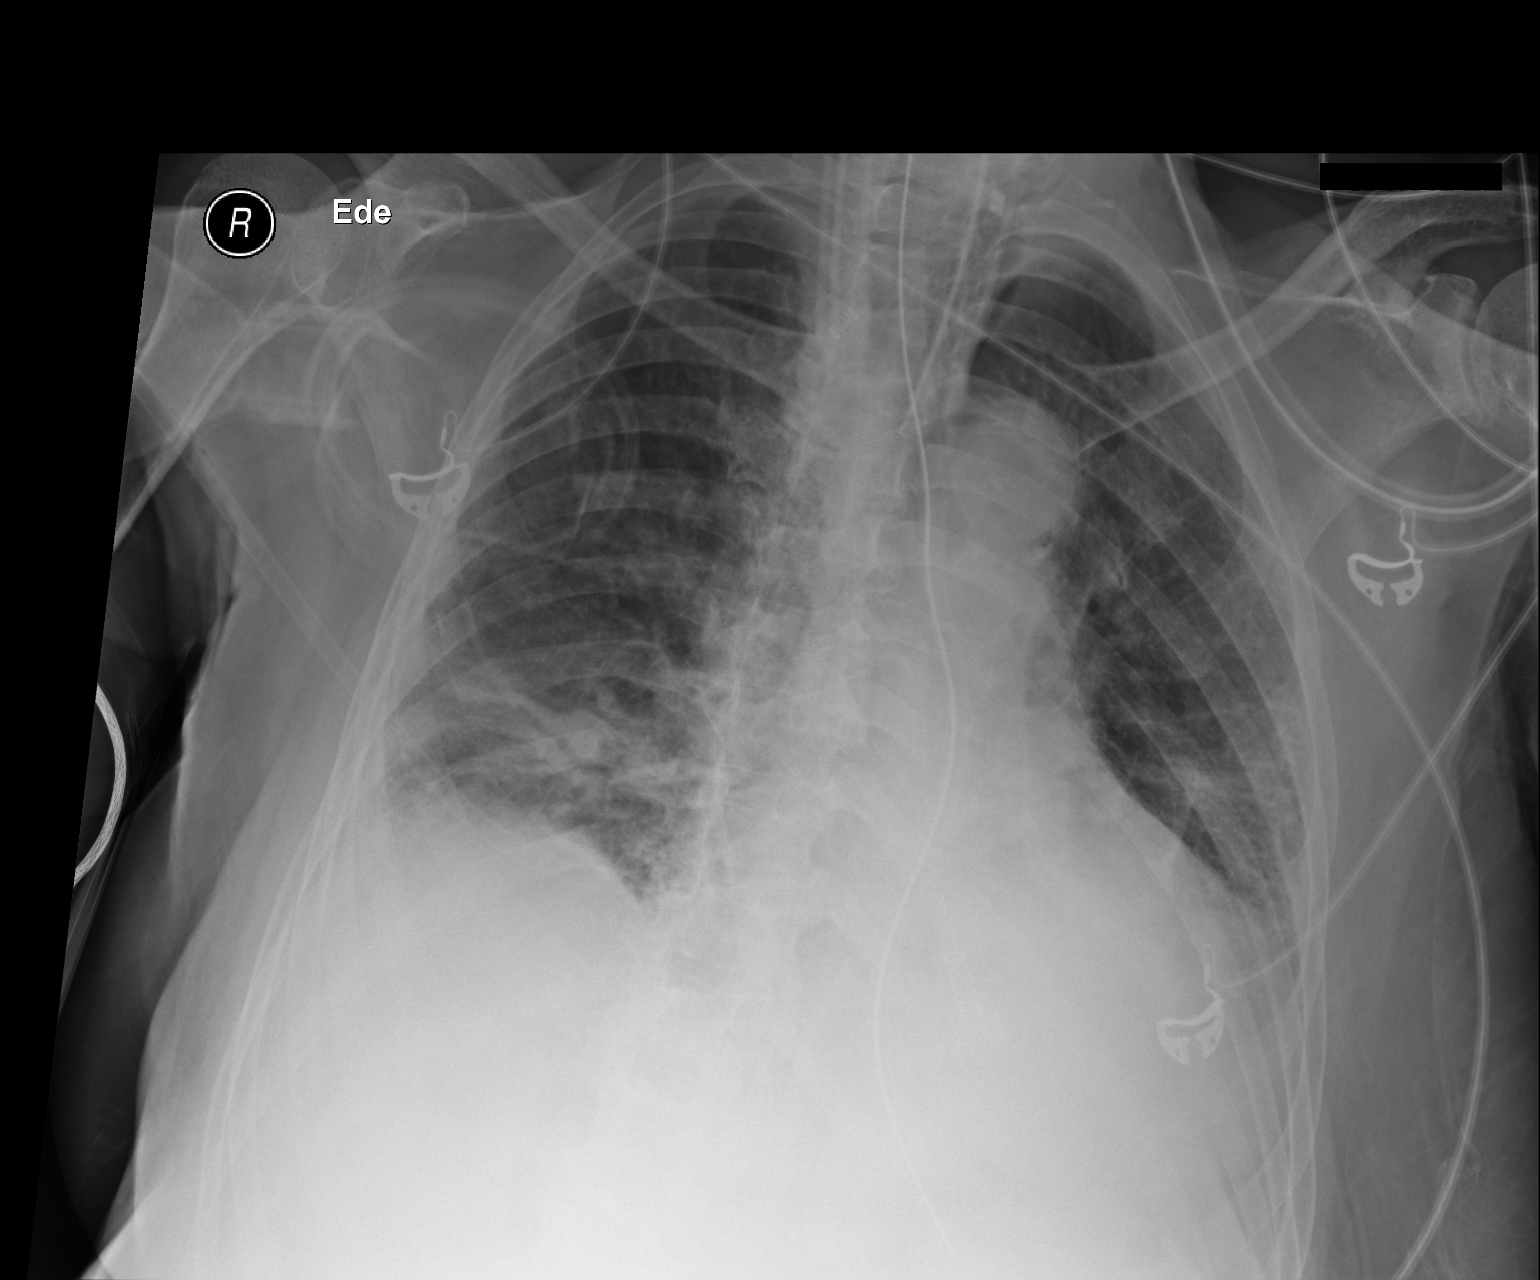

[1 of 1 positions shown; findings below may reference images not displayed]

FINDINGS: Low lung volumes. Cardiac silhouette is within the upper limits of
normal. Endotracheal tube is appreciated with tip 4.7 mm above the
carina projecting in the level of the clavicles. There is increased
density projects in the lung bases. There is blunting of the
costophrenic angles. Prominence of interstitial markings appreciated
as well as areas of peribronchial cuffing. Atherosclerotic
calcification identified within the aorta. The osseous structures
unremarkable. An NG tube is appreciated tip not on the view of this
study.
IMPRESSION: 1. Endotracheal tube which appears to be adequately positioned.
2. Interstitial infiltrate differential considerations are pulmonary
edema versus an infectious or inflammatory infiltrate.
3. Small bilateral effusions left greater than right
4. Areas of asymmetric edema versus atelectasis versus infiltrate
within the lung bases.

## 2015-10-16 ENCOUNTER — Other Ambulatory Visit: Payer: Self-pay | Admitting: Internal Medicine

## 2015-10-30 IMAGING — CR DG CHEST 1V PORT
1 series · 1 of 1 positions shown · non-contrast
Comparison: DG CHEST 1V PORT dated 04/05/2013; DG CHEST 1V PORT
dated 04/03/2013; DG CHEST 1V PORT dated 04/01/2013

CLINICAL DATA: Percutaneous endoscopic gastrostomy tube placement

EXAM:
PORTABLE CHEST - 1 VIEW

[AP]
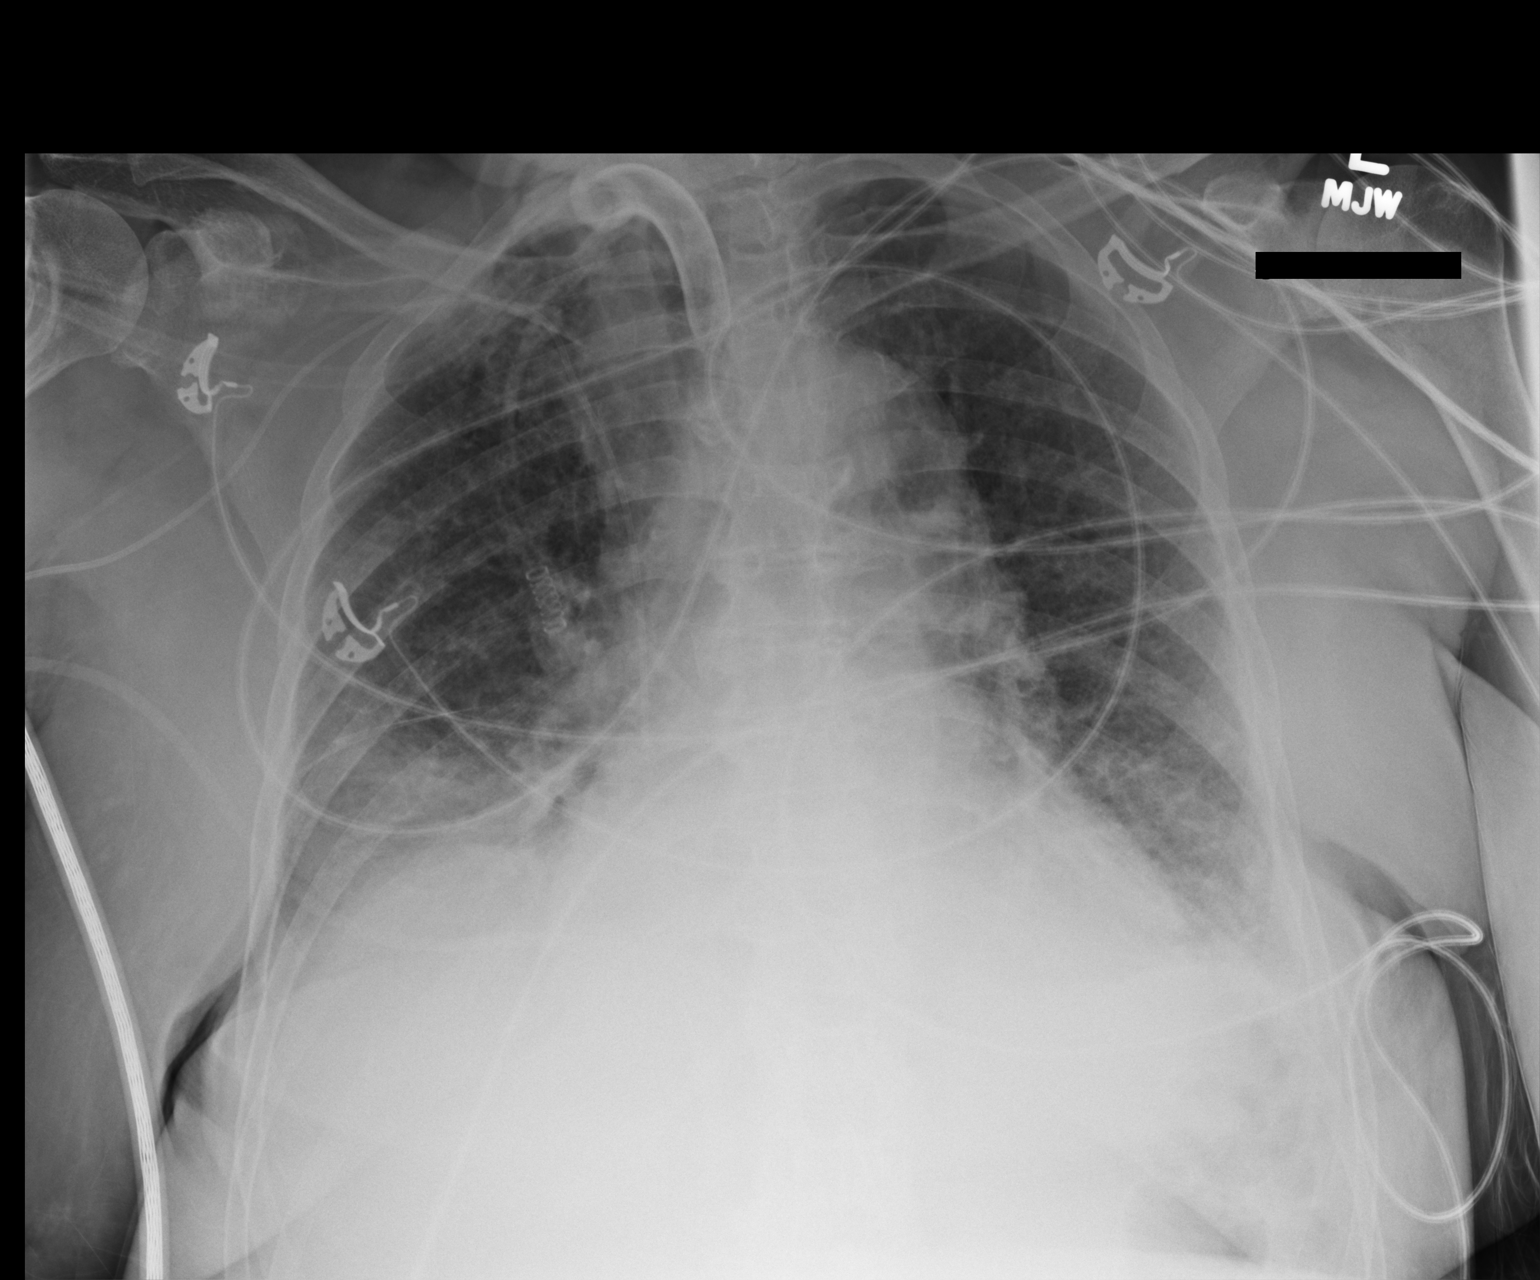

[1 of 1 positions shown; findings below may reference images not displayed]

FINDINGS: Grossly unchanged borderline enlarged cardiac silhouette and
mediastinal contours given patient rotation. Stable position of
support apparatus. No pneumothorax. No change to minimal decrease in
suspected trace bilateral effusions. Bibasilar heterogeneous
opacities have minimally worsened in the interval. Mild poor venous
congestion without frank evidence of edema. Unchanged bones.
IMPRESSION: 1.  Stable positioning of support apparatus.  No pneumothorax.
2. Worsening bibasilar opacities, atelectasis versus infiltrate.
3. No change to minimal decrease in suspected trace bilateral
effusions. Pulmonary venous congestion without frank evidence of
edema.

## 2015-10-30 IMAGING — CT CT ABD-PELV W/ CM
2 of 5 series · 15 of 46 positions shown, 17 images · IV contrast (omnipaque)
Comparison: DG ABD PORTABLE 1V dated 04/06/2013; CT ABD/PELV WO CM
dated 03/27/2013; CT ABD/PELVIS W CM dated 03/22/2013

CLINICAL DATA: Evaluate for abscess.  S/P surgery.  SBO

EXAM:
CT ABDOMEN AND PELVIS WITH CONTRAST
TECHNIQUE: Multidetector CT imaging of the abdomen and pelvis was performed
using the standard protocol following bolus administration of
intravenous contrast.
CONTRAST:  80mL OMNIPAQUE IOHEXOL 300 MG/ML  SOLN

[Series 2: abd/ pelvis 5.0 i30f 1 · axial · 0.67mm/px · z∈[+1149,+1564]mm · 12 of 93 slices shown, 14 images]
[im 5/93  soft-tissue]
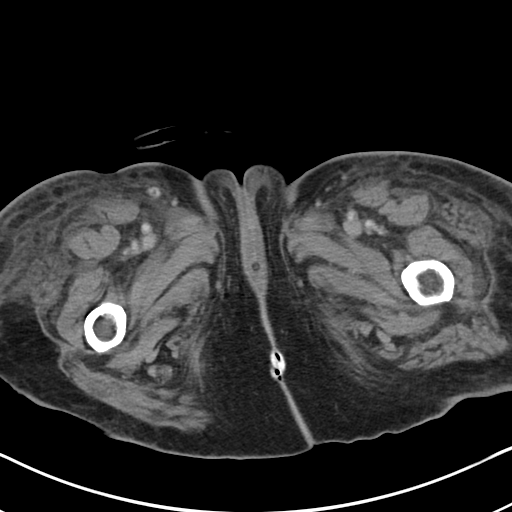
[im 5/93  bone]
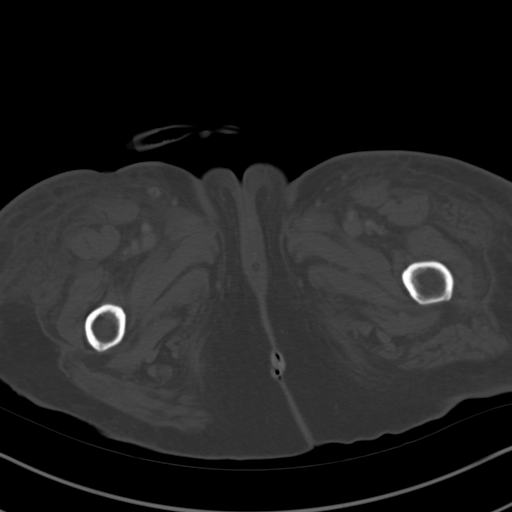
[im 14/93  soft-tissue]
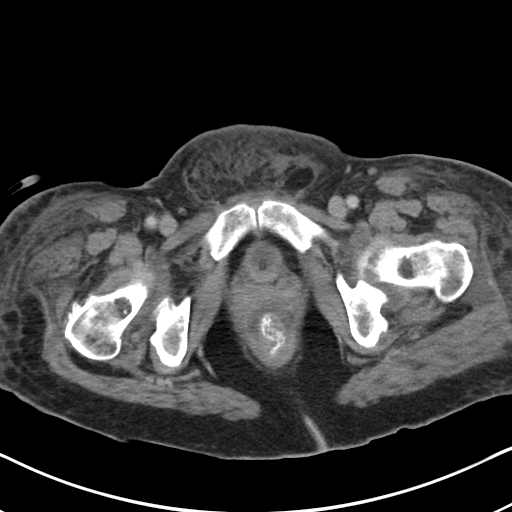
[im 19/93  soft-tissue]
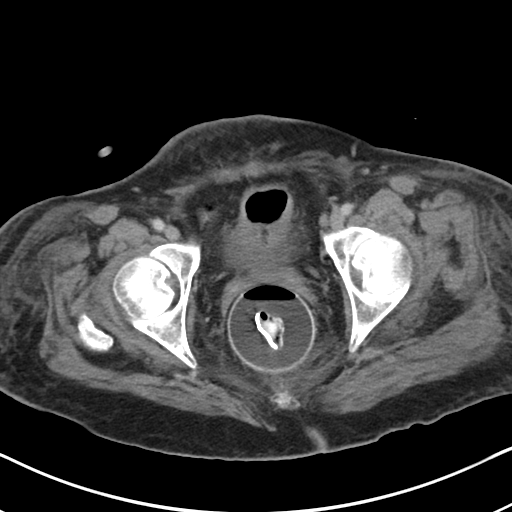
[im 28/93  soft-tissue]
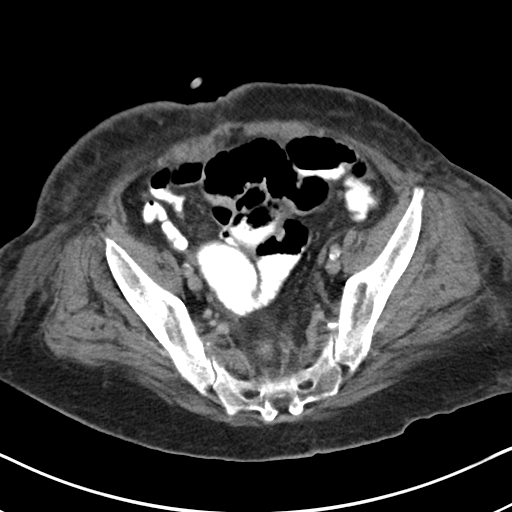
[im 37/93  soft-tissue]
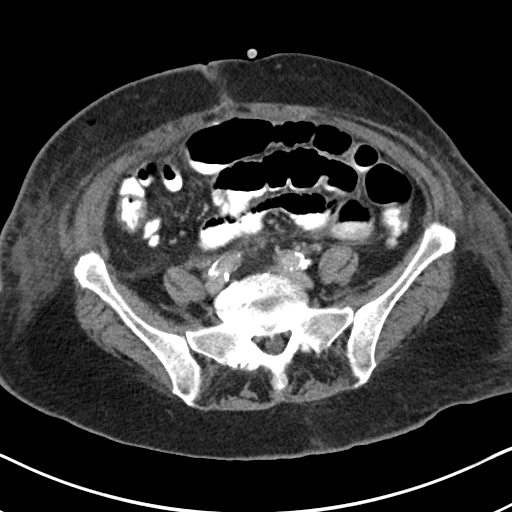
[im 42/93  soft-tissue]
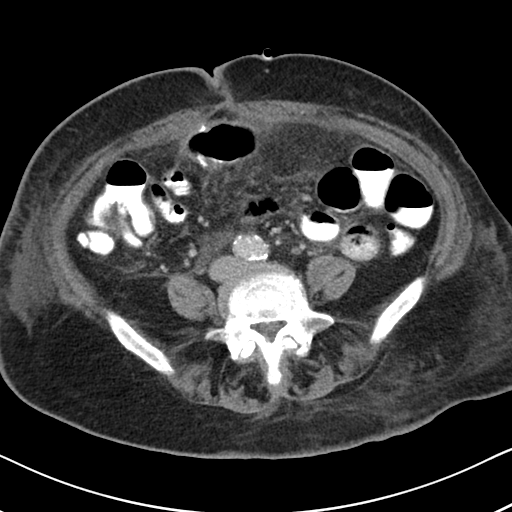
[im 51/93  soft-tissue]
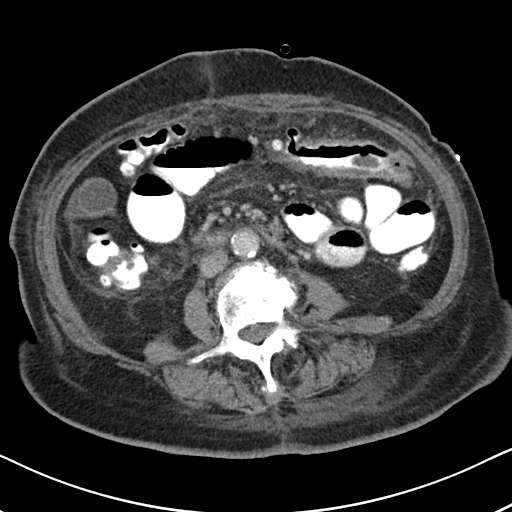
[im 56/93  soft-tissue]
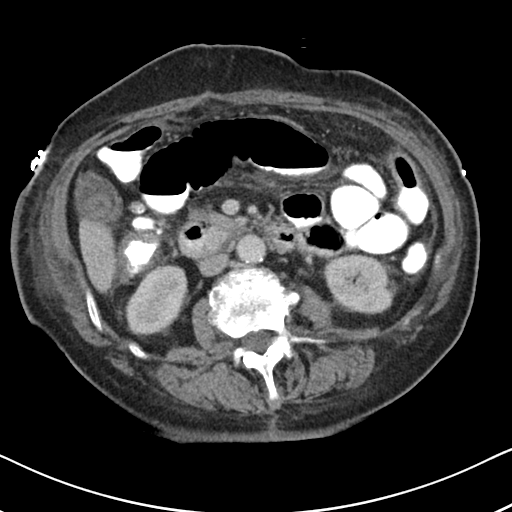
[im 65/93  soft-tissue]
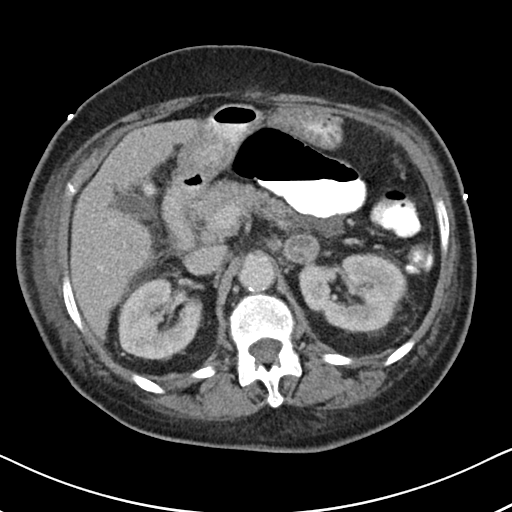
[im 65/93  bone]
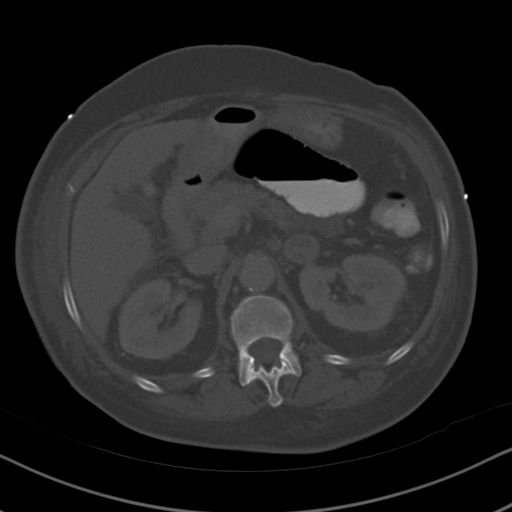
[im 74/93  soft-tissue]
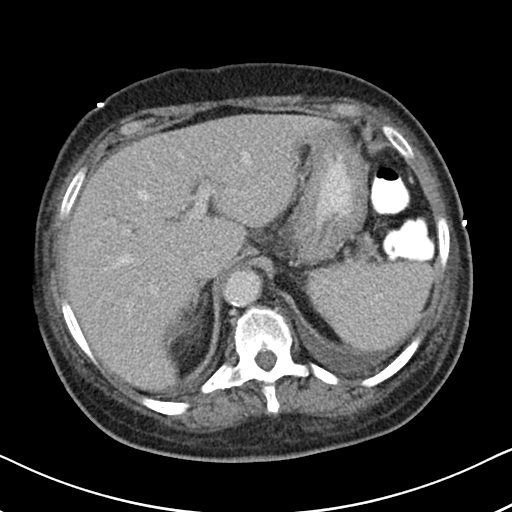
[im 79/93  soft-tissue]
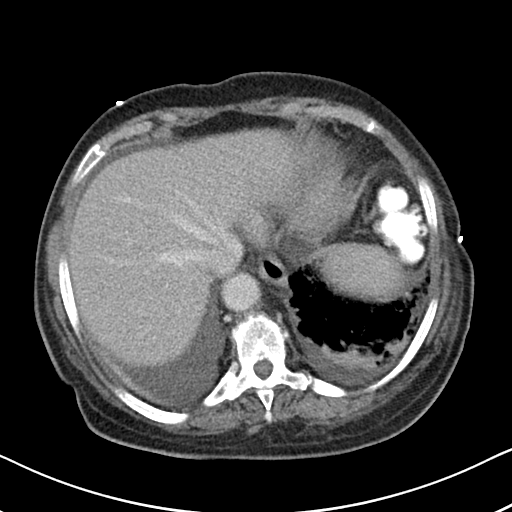
[im 88/93  soft-tissue]
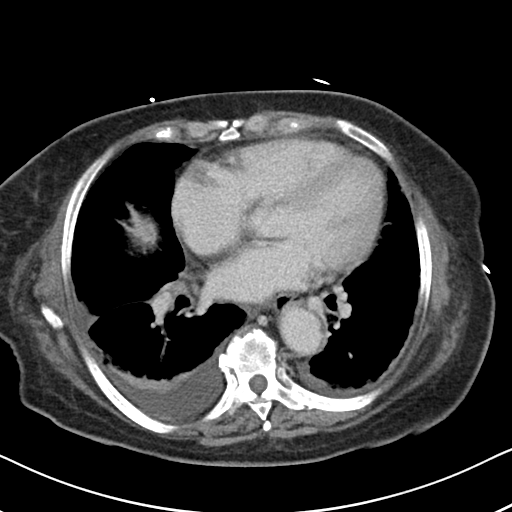

[Series 5: coronal soft tissue · coronal · 0.90mm/px · 3 of 85 slices shown]
[im 29/85  soft-tissue]
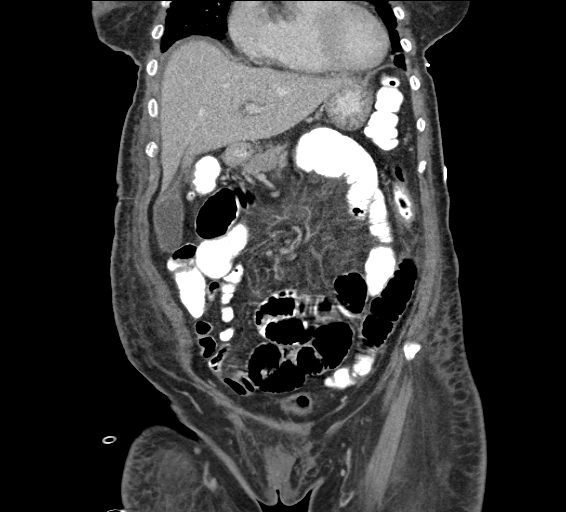
[im 38/85  soft-tissue]
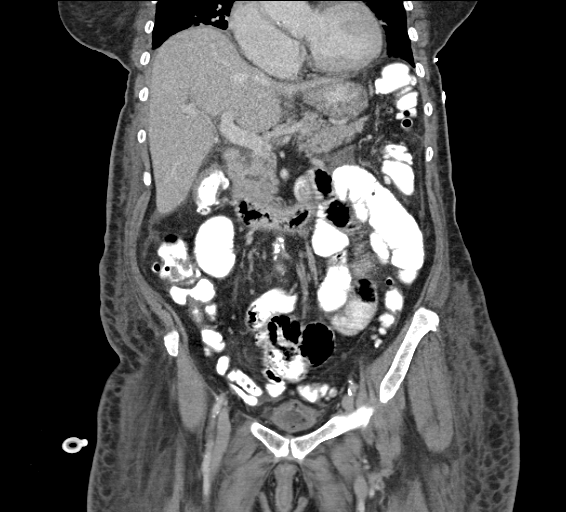
[im 47/85  soft-tissue]
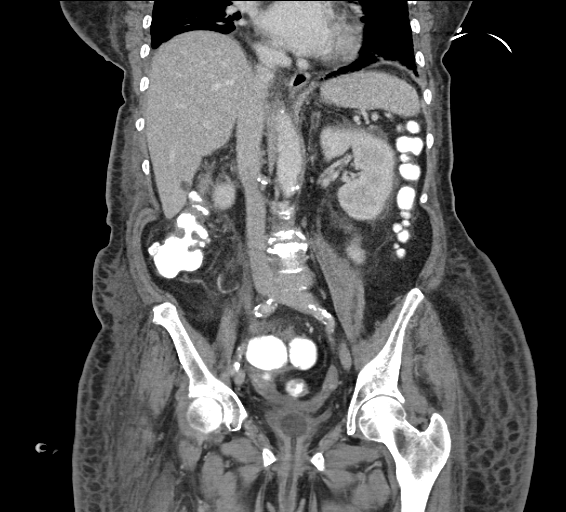

[15 of 46 positions shown; findings below may reference images not displayed]

FINDINGS: Evaluation lung bases demonstrates small bilateral pleural effusions
slightly decreased in size compared to previous study. There is
diffuse thickening of the interstitial markings which is also
decreased in conspicuity when compared to previous study. Multi
chamber cardiac enlargement is appreciated.

The liver, spleen, right adrenal, and kidneys are unremarkable. A
stable 2 cm nodule is identified along the lower pole of the left
adrenal. The finding is stable when compared to prior studies and
again statistically likely represents an adenoma with os
indeterminate CT characteristics. If clinically warranted this
finding may be further characterized with MRI, adrenal protocol.

Multiple dilated loops of small bowel are appreciated within the
central and lower abdomen containing contrast. Contrast is
appreciated within loops of nondilated large bowel.

There is evidence of bowel wall thickening involving the mid to
distal aspect of the transverse colon. Inflammatory change is
identified within the adjacent mesenteric fat. A small fluid
attenuating focus projects deep to the umbilicus image 48 series 2
measuring 2.9 x 1 cm. This finding is adjacent to the mid transverse
colon. Differential considerations are post- operative changes.
There is diffuse diverticulosis within the transverse colon.

There is otherwise no further evidence of loculated fluid
collections. Mild diffuse free fluid is appreciated within the
abdomen as well as mild mesenteric edema.

Evaluation gallbladder demonstrates a partial calcified gallstone in
the dependent portion of gallbladder. Small amount of
pericholecystic fluid is appreciated.

There is no evidence of abdominal aortic aneurysm. Atherosclerotic
calcifications are identified within the abdominal aorta and
mesenteric vessels.

There is no evidence of abdominal or pelvic masses nor adenopathy.

Evaluation pelvis demonstrates a small amount of free fluid within
the dependent portions of the pelvis. A rectal tube is identified
with balloon insufflated in the rectum.

A small fat containing left inguinal hernia is identified. A small
fat containing umbilical hernia is also appreciated.

There is evidence of infiltration of the subcutaneous fat of the
abdominal and pelvic wall.

A percutaneous gastric feeding tube projects in the region the body
of the stomach.

There is not appear to be evidence of aggressive appearing osseous
lesions multilevel spondylosis is appreciated within the spine.
IMPRESSION: 1. CT findings consistent with a small bowel ileus without further
evidence of obstruction.
2. Postoperative changes identified within the abdomen. The small
focal fluid collection deep to the umbilicus likely represents
postoperative change.
3. Findings concerning for colitis infectious or inflammatory
involving the transverse colon. These findings may reflect the
sequela of diverticulitis considering the diffuse diverticulosis
which is present. These findings are best appreciated on images 40
through 45 series [DATE]. Pericholecystic fluid likely postsurgical. Gallstone is
identified within the gallbladder.
5. Improving bilateral effusions and decreased conspicuity of the
areas of dependent atelectasis. The interstitial findings have also
decreased in conspicuity likely reflecting improving pulmonary
edema.

## 2015-11-07 ENCOUNTER — Other Ambulatory Visit: Payer: Self-pay | Admitting: Internal Medicine

## 2015-11-08 IMAGING — CR DG CHEST 1V PORT
1 series · 1 of 1 positions shown · non-contrast
Comparison: 04/12/2013

CLINICAL DATA: Respiratory failure.

EXAM:
PORTABLE CHEST - 1 VIEW

[AP]
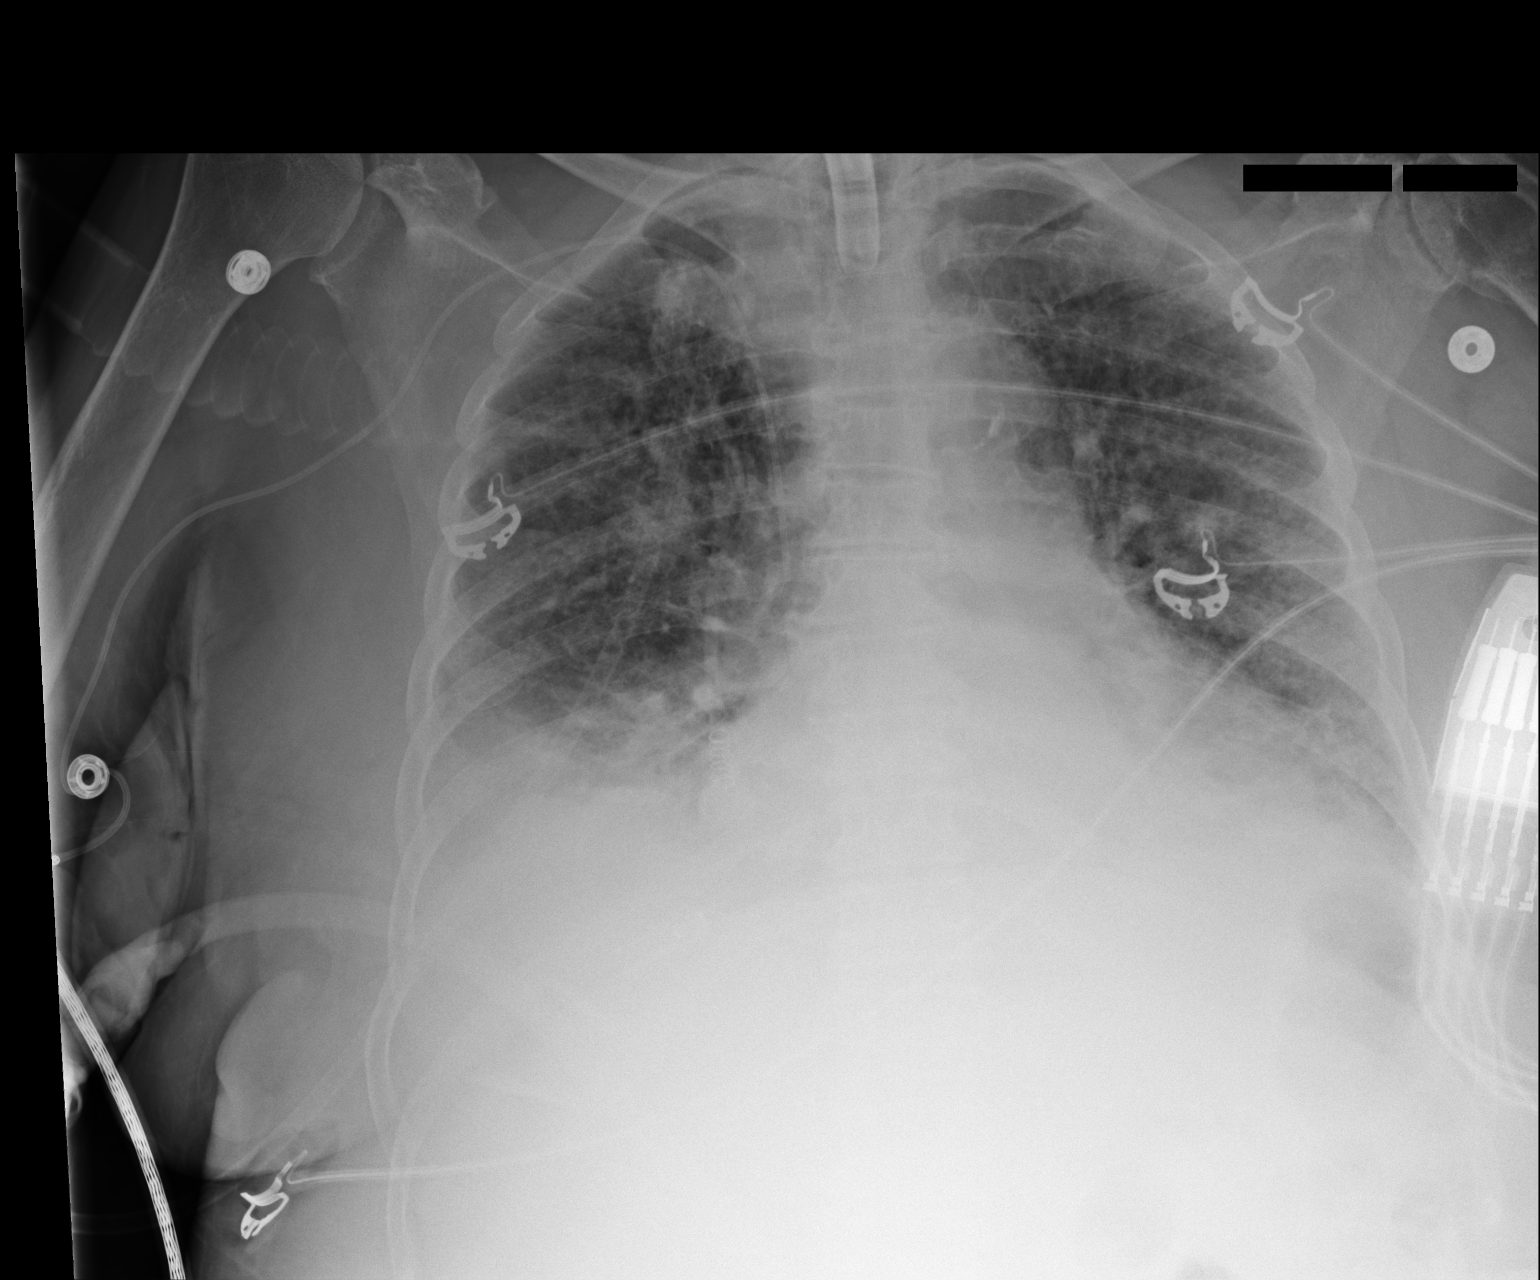

[1 of 1 positions shown; findings below may reference images not displayed]

FINDINGS: Tracheostomy tube overlies the airway. Right PICC remains in place
with tip overlying the mid SVC. The cardiac silhouette remains
enlarged and partially obscured. Lungs remain hypoinflated with
similar appearance of bibasilar opacities. Mild degenerative changes
are present in the thoracic spine.
IMPRESSION: Bibasilar lung opacities without significant interval change.

## 2015-11-08 NOTE — Telephone Encounter (Signed)
I cannot find reason on BL or recent note why she needs this med daily. Fill 30 days only and refer back to PCP

## 2015-11-09 IMAGING — DX DG CHEST 1V PORT
1 series · 1 of 1 positions shown · non-contrast
Comparison: 04/16/2013

CLINICAL DATA: Fever. Acute respiratory failure. Sepsis. Acute
renal failure.

EXAM:
PORTABLE CHEST - 1 VIEW

[portable]
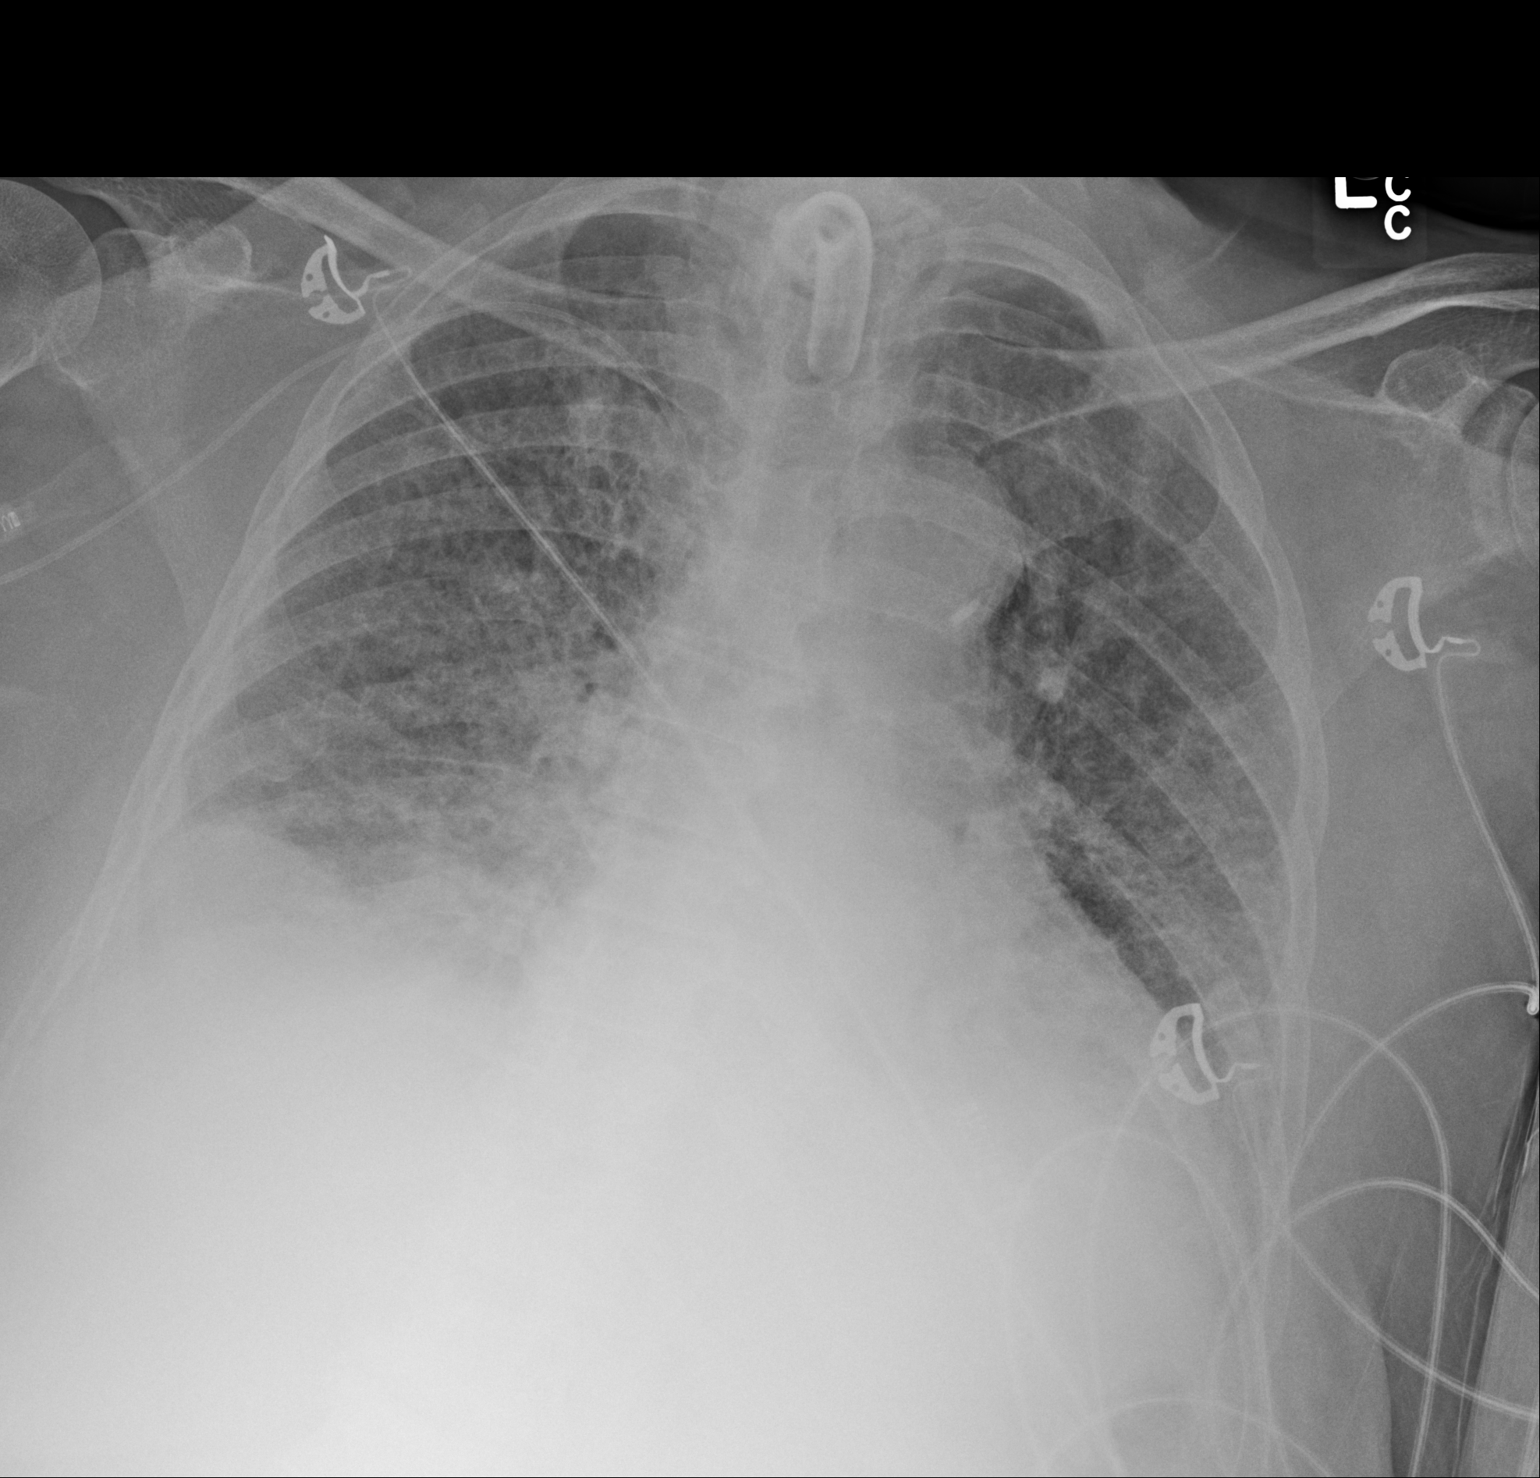

[1 of 1 positions shown; findings below may reference images not displayed]

FINDINGS: Tracheostomy tube and right arm PICC line remain in appropriate
position. Diffuse interstitial infiltrates are again seen,
suspicious for diffuse pulmonary edema. There is decreased
atelectasis seen in lung bases. Small bilateral pleural effusions
cannot be excluded. Cardiomegaly is stable.
IMPRESSION: Stable cardiomegaly and diffuse interstitial edema pattern.

Mild decrease in bibasilar atelectasis.

## 2015-11-28 ENCOUNTER — Encounter: Payer: Self-pay | Admitting: Internal Medicine

## 2015-11-28 ENCOUNTER — Ambulatory Visit (HOSPITAL_COMMUNITY)
Admission: RE | Admit: 2015-11-28 | Discharge: 2015-11-28 | Disposition: A | Discharge status: Home / Self Care | Payer: Medicare Other | Source: Ambulatory Visit | Attending: Internal Medicine | Admitting: Internal Medicine

## 2015-11-28 ENCOUNTER — Other Ambulatory Visit: Payer: Self-pay | Admitting: Internal Medicine

## 2015-11-28 ENCOUNTER — Ambulatory Visit (INDEPENDENT_AMBULATORY_CARE_PROVIDER_SITE_OTHER): Payer: Medicare Other | Admitting: Internal Medicine

## 2015-11-28 VITALS — BP 145/71 | HR 72 | Temp 98.0°F | Ht 61.0 in | Wt 143.1 lb

## 2015-11-28 DIAGNOSIS — R2689 Other abnormalities of gait and mobility: Secondary | ICD-10-CM

## 2015-11-28 DIAGNOSIS — Z79899 Other long term (current) drug therapy: Secondary | ICD-10-CM

## 2015-11-28 DIAGNOSIS — M25552 Pain in left hip: Secondary | ICD-10-CM | POA: Diagnosis present

## 2015-11-28 DIAGNOSIS — M810 Age-related osteoporosis without current pathological fracture: Secondary | ICD-10-CM

## 2015-11-28 DIAGNOSIS — I10 Essential (primary) hypertension: Secondary | ICD-10-CM | POA: Diagnosis not present

## 2015-11-28 DIAGNOSIS — M19012 Primary osteoarthritis, left shoulder: Secondary | ICD-10-CM

## 2015-11-28 DIAGNOSIS — M25512 Pain in left shoulder: Secondary | ICD-10-CM

## 2015-11-28 DIAGNOSIS — E559 Vitamin D deficiency, unspecified: Secondary | ICD-10-CM

## 2015-11-28 DIAGNOSIS — Z23 Encounter for immunization: Secondary | ICD-10-CM

## 2015-11-28 DIAGNOSIS — Z0000 Encounter for general adult medical examination without abnormal findings: Secondary | ICD-10-CM

## 2015-11-28 DIAGNOSIS — M1612 Unilateral primary osteoarthritis, left hip: Secondary | ICD-10-CM | POA: Insufficient documentation

## 2015-11-28 NOTE — Assessment & Plan Note (Addendum)
Left hip pain. Began after finishing PT for her right foot fracture. 2-3 afterwards developed tightness in her bilateral lower extremities. No numbness or tingling. Has aching pain in her thigh/calf on her left lower extremity. Has pain in her left hip with walking. No alarm symptoms. Symptoms began after starting bisphosphonate therapy.  Using cane to help walking, has been requiring cane for several months.    No pinpoint tenderness on exam. Full range of motion. Normal strength and sensation. Shuffling gait.   Plan  Left hip X-ray to assess for any occult fracture. May require MRI of the hip if negative.  Stop bisphosphate therapy. Will get DEXA prior to restarting any bisphosphate given unclear diagnosis of osteoporosis with only metatarsal fracture.   ADDENDUM X-ray negative for any fracture or other acute abnormality. Only chronic degenerative arthritic changes present.  Unable to reach patient over the phone. Will have clinic try to reach patient for possible MRI with concern for possible occult fracture. If pain has resolved may opt for not further imaging. Would like to get DEXA scan to assess further for potential osteoporosis.

## 2015-11-28 NOTE — Progress Notes (Signed)
   CC: HTN follow up  HPI:  [name] is a 82 y.o. female with a past medical history listed below here today for follow up of her HTN.  For details of today's visit and the status of her chronic medical issues please refer to the assessment and plan.   Past Medical History:  Diagnosis Date  . Abnormality of gait 11/22/2014  . Arthritis   . Cyst of breast, right, benign solitary   . Dyslipidemia   . H/O: hysterectomy   . HHNC (hyperglycemic hyperosmolar nonketotic coma) ([facility]) 05/14/2013  . History of appendectomy   . History of lumbosacral spine surgery   . Hypertension   . Inguinal hernia   . Restless leg syndrome     Review of Systems:   Review of Systems  Constitutional: Negative for chills and fever.  Eyes: Negative for blurred vision and double vision.  Musculoskeletal: Positive for joint pain. Negative for back pain and falls.  Neurological: Negative for dizziness, weakness and headaches.   Physical Exam:  Vitals:   11/28/15 1334 11/28/15 1435  BP: (!) 154/62 (!) 145/71  Pulse: 75 72  Temp: 98 F (36.7 C)   TempSrc: Oral   SpO2: 100%   Weight: 143 lb 1.6 oz (64.9 kg)   Height: '5\' 1"'$  (1.549 m)    GENERAL- alert, co-operative, appears as stated age, not in any distress. HEENT- Atraumatic, normocephalic, PERRL, EOMI, oral mucosa appears moist CARDIAC- RRR, no murmurs, rubs or gallops. RESP- Moving equal volumes of air, and clear to auscultation bilaterally, no wheezes or crackles. ABDOMEN- Soft, nontender, bowel sounds present. EXTREMITIES- shoulder with good passive range of movement. Active range of movement limited by pain. Left hip non-tender to palpation. Strength 5/5 in bilateral LE. Intact sensation. Abnormal shuffling gait with right sided preference.  SKIN- Warm, dry, No rash or lesion. PSYCH- Normal mood and affect, appropriate thought content and speech.   Assessment & Plan:   See Encounters Tab for problem based charting.  Patient seen  with Dr. Eppie Gibson

## 2015-11-28 NOTE — Assessment & Plan Note (Deleted)
Doing well  pain in the thigh or groin area?   3 to 5 years

## 2015-11-28 NOTE — Assessment & Plan Note (Addendum)
Found to have vitamin D deficiency at 08/2015 visit, Vit D level 18.1. Started on oral Vit D supplement, 1000 units daily. Reports compliance.   Vitamin D level dropped from 18 to 14 today despite therapy.  Plan Will start 50,000 Unites Ergocalciferol weekly x 8 weeks and then resume 1000 units daily thereafter. Recheck level in 3 months.

## 2015-11-28 NOTE — Assessment & Plan Note (Addendum)
She had pain her left shoulder at 08/2015 visit. Received left shoulder steroid injection at that time. Has a history of arthritis and pain requiring injections in the past.  Today, she says pain is improved from previous. Still has occasional pain. Unable to reach above head. Steroid injection at least visit helped for about a month before pain returned.   Plan  Shoulder Injection Procedure Note  Pre-operative Diagnosis: left shoulder arthritis  Indications: Symptom relief from osteoarthritis  Anesthesia: Lidocaine 1% without epinephrine without added sodium bicarbonate  Procedure Details   Verbal consent was obtained for the procedure. The shoulder was prepped with iodine. Using a 22 gauge needle the left shoulder was injected with 1 mL 1% lidocaine and 1 mL of triamcinolone (KENALOG) '40mg'$ /ml. The injection site was cleansed with topical isopropyl alcohol and a dressing was applied.  Complications:  None; patient tolerated the procedure well.

## 2015-11-28 NOTE — Patient Instructions (Addendum)
Thank you for coming in today.   I am going to get an Xray of your hip today. I want to make sure there are no fractures or other potential issues going on today. I will call you in the next 1-2 days with the results. Please stop the alendronate until we are able to get more information. I would like to get a DEXA (bone density scan) in the future.   Below is information about your shoulder injection (replace knee with shoulder). If you develop worsening pain, redness, warmth, fevers please give [place] a call.    Knee Injection A knee injection is a procedure to get medicine into your knee joint. Your health care provider puts a needle into the joint and injects medicine with an attached syringe. The injected medicine may relieve the pain, swelling, and stiffness of arthritis. The injected medicine may also help to lubricate and cushion your knee joint. You may need more than one injection. LET [facility] CARE PROVIDER KNOW ABOUT:  Any allergies you have.  All medicines you are taking, including vitamins, herbs, eye drops, creams, and over-the-counter medicines.  Previous problems you or members of your family have had with the use of anesthetics.  Any blood disorders you have.  Previous surgeries you have had.  Any medical conditions you may have. RISKS AND COMPLICATIONS Generally, this is a safe procedure. However, problems may occur, including:  Infection.  Bleeding.  Worsening symptoms.  Damage to the area around your knee.  Allergic reaction to any of the medicines.  Skin reactions from repeated injections. BEFORE THE PROCEDURE  Ask your health care provider about changing or stopping your regular medicines. This is especially important if you are taking diabetes medicines or blood thinners.  Plan to have someone take you home after the procedure. PROCEDURE  You will sit or lie down in a position for your knee to be treated.  The skin over your kneecap will be cleaned  with a germ-killing solution (antiseptic).  You will be given a medicine that numbs the area (local anesthetic). You may feel some stinging.  After your knee becomes numb, you will have a second injection. This is the medicine. This needle is carefully placed between your kneecap and your knee. The medicine is injected into the joint space.  At the end of the procedure, the needle will be removed.  A bandage (dressing) may be placed over the injection site. The procedure may vary among health care providers and hospitals. AFTER THE PROCEDURE  You may have to move your knee through its full range of motion. This helps to get all of the medicine into your joint space.  Your blood pressure, heart rate, breathing rate, and blood oxygen level will be monitored often until the medicines you were given have worn off.  You will be watched to make sure that you do not have a reaction to the injected medicine.   This information is not intended to replace advice given to you by your health care provider. Make sure you discuss any questions you have with your health care provider.   Document Released: 05/04/2006 Document Revised: 03/03/2014 Document Reviewed: 12/21/2013 Elsevier Interactive Patient Education [employer].

## 2015-11-28 NOTE — Assessment & Plan Note (Addendum)
Presumed osteoporosis secondary to potential fragility fracture of her metatarsal earlier this year. Since starting bisphosphonate therapy she has been having left hip pain.   Will stop bisphosphate therapy today (see left hip pain A/P). Obtain DEXA before re-starting therapy.

## 2015-11-28 NOTE — Assessment & Plan Note (Addendum)
Gave flu shot today Gave Prevnar 13 today, previously received PPSV23. Will give Tdap (unsure when last got) at next visit as patient does not wish to get any further injections today.

## 2015-11-28 NOTE — Assessment & Plan Note (Addendum)
BP Readings from Last 3 Encounters:  11/28/15 (!) 145/71  09/12/15 (!) 162/70  08/15/15 (!) 154/65    Lab Results  Component Value Date   NA 143 03/21/2015   K 4.2 03/21/2015   CREATININE 0.74 03/21/2015    Assessment: Currently taking Amlodipine 10 mg, Imdur 45 mg, losartan 100 mg, metoprolol succinate 25 mg. Checking at home. Runs around 680-881J systolic at home. No log today. No headaches, dizziness/lightheadedness, N/V, visual disturbances.   BP initially 154/62 initially. 145/71 on re-check.   Unclear why she is on the Imdur. Presumably 2/2 her dCHF.   Plan: Will continue home medications. Goal BP <150/90.

## 2015-11-29 LAB — VITAMIN D 25 HYDROXY (VIT D DEFICIENCY, FRACTURES): VIT D 25 HYDROXY: 14.1 ng/mL — AB (ref 30.0–100.0)

## 2015-12-01 MED ORDER — VITAMIN D (ERGOCALCIFEROL) 1.25 MG (50000 UNIT) PO CAPS: 50000.0000 [IU] | ORAL_CAPSULE | ORAL | 0 refills | Status: DC

## 2015-12-04 NOTE — Progress Notes (Signed)
I saw and evaluated the patient. I personally confirmed the key portions of Dr. Shanna Cisco history and exam and reviewed pertinent patient test results. The assessment, diagnosis, and plan were formulated together and I agree with the documentation in the resident's note.  I was present at the patient's side for the entire left shoulder joint injection.  The patient tolerated the procedure well without any immediate complications.

## 2015-12-05 ENCOUNTER — Ambulatory Visit (INDEPENDENT_AMBULATORY_CARE_PROVIDER_SITE_OTHER): Payer: Medicare Other | Admitting: Adult Health

## 2015-12-05 ENCOUNTER — Encounter: Payer: Self-pay | Admitting: Adult Health

## 2015-12-05 VITALS — BP 158/83 | HR 86 | Ht 61.0 in | Wt 142.6 lb

## 2015-12-05 DIAGNOSIS — G2581 Restless legs syndrome: Secondary | ICD-10-CM

## 2015-12-05 DIAGNOSIS — M48062 Spinal stenosis, lumbar region with neurogenic claudication: Secondary | ICD-10-CM | POA: Diagnosis not present

## 2015-12-05 NOTE — Patient Instructions (Signed)
Continue Mirapex Physical therapy

## 2015-12-05 NOTE — Progress Notes (Signed)
PATIENT: Kristi Alexander DOB: October 22, 1925  REASON FOR VISIT: follow up-Restless leg syndrome, abnormality of gait HISTORY FROM: patient  HISTORY OF PRESENT ILLNESS: [name] is an 82 year old female with a history of restless leg syndrome and abnormality of gait. She returns today for follow-up. In the past she has had epidural steroid injections in the back and reports this is not been beneficial. She reports that she does not have significant pain in the leg. She describes it as an ache. She states it is better with activity and worse when she is sitting. She states that she does not feel that she got restless legs however Mirapex has offer some benefit. She denies any significant changes with her gait or balance. She uses a cane when ambulating. She reports that she's had physical therapy in the past and it has been beneficial for "aches in her legs." She returns today for evaluation.   HISTORY 06/05/15: [name] is an 82 year old female with a history of restless leg syndrome and abnormality of gait. She returns today for follow-up. The patient had a lumbar MRI that indicated spinal stenosis. She underwent a epidural steroid injection she states that she did not gain much benefit from this. She states that currently she has not been focused on her leg discomfort as she injured her right ankle. She states that she sprained her ankle and her doctor recommended that she stay off that foot for 4-6 weeks. Right now she has been dealing with discomfort from the ankle and has not noticed any discomfort in the legs. She feels that Mirapex continues to work well for her restless leg symptoms. She states that she sleeps well at night. She denies any new neurological symptoms. She returns today for an evaluation.  HISTORY 11/22/14 (WILLIS): [name] is an 82 year old right-handed black female with a history of restless leg syndrome. The patient had significant anemia previously, this has been  corrected, and the patient indicates that the restless leg symptoms have improved. Within the last 4 weeks or so, she has noted some sensation of stiffness in the legs that is worse in the morning, better as the day goes on. She feels as if her balance has been altered, she has not had any falls, but she uses a quad cane for ambulation. She reports no true muscle cramps or discomfort in the legs, she denies any significant neck or back pain or difficulty controlling the bowels or the bladder. She indicates that when she raises one arm or the other, she may feel a pulling sensation in the leg. She is brought into the office today for further evaluation.   REVIEW OF SYSTEMS: Out of a complete 14 system review of symptoms, the patient complains only of the following symptoms, and all other reviewed systems are negative.  See history of present illness  ALLERGIES: Allergies  Allergen Reactions  . Penicillins Swelling    HOME MEDICATIONS: Outpatient Medications Prior to Visit  Medication Sig Dispense Refill  . amLODipine (NORVASC) 10 MG tablet TAKE ONE TABLET BY MOUTH ONCE DAILY 30 tablet 3  . aspirin 81 MG tablet 81 mg by Gastric Tube route daily.     . ferrous sulfate 325 (65 FE) MG tablet Take 325 mg by mouth 2 (two) times daily with a meal.    . hydrOXYzine (ATARAX/VISTARIL) 25 MG tablet TAKE ONE TABLET BY MOUTH ONCE DAILY AS NEEDED 30 tablet 0  . isosorbide mononitrate (IMDUR) 30 MG 24 hr tablet TAKE ONE  AND ONE HALF TABLET BY MOUTH ONCE DAILY 135 tablet 3  . losartan (COZAAR) 100 MG tablet Take 1 tablet (100 mg total) by mouth daily. 90 tablet 2  . metoprolol succinate (TOPROL-XL) 25 MG 24 hr tablet Take 1 tablet (25 mg total) by mouth daily. 90 tablet 3  . pantoprazole (PROTONIX) 40 MG tablet TAKE ONE TABLET BY MOUTH ONCE DAILY 30 tablet 2  . pramipexole (MIRAPEX) 0.5 MG tablet TAKE ONE TABLET BY MOUTH ONCE DAILY 90 tablet 1  . vitamin B-12 (CYANOCOBALAMIN) 1000 MCG tablet Take 1 tablet  (1,000 mcg total) by mouth daily.    . Vitamin D, Cholecalciferol, 1000 units CAPS Take 1,000 Units by mouth daily. 90 capsule 0  . Vitamin D, Ergocalciferol, (DRISDOL) 50000 units CAPS capsule Take 1 capsule (50,000 Units total) by mouth every 7 (seven) days. 8 capsule 0   No facility-administered medications prior to visit.     PAST MEDICAL HISTORY: Past Medical History:  Diagnosis Date  . Abnormality of gait 11/22/2014  . Arthritis   . Cyst of breast, right, benign solitary   . Dyslipidemia   . H/O: hysterectomy   . HHNC (hyperglycemic hyperosmolar nonketotic coma) ([place]) 05/14/2013  . History of appendectomy   . History of lumbosacral spine surgery   . Hypertension   . Inguinal hernia   . Restless leg syndrome     PAST SURGICAL HISTORY: Past Surgical History:  Procedure Laterality Date  . ABDOMINAL HYSTERECTOMY    . APPENDECTOMY    . BOWEL RESECTION N/A 03/23/2013   Procedure: SMALL BOWEL RESECTION;  Surgeon: Gwenyth Ober, MD;  Location: [facility];  Service: General;  Laterality: N/A;  . ESOPHAGOGASTRODUODENOSCOPY N/A 04/01/2013   Procedure: ESOPHAGOGASTRODUODENOSCOPY (EGD);  Surgeon: Gwenyth Ober, MD;  Location: [facility] ENDOSCOPY;  Service: General;  Laterality: N/A;  . LAPAROTOMY N/A 03/23/2013   Procedure: EXPLORATORY LAPAROTOMY;  Surgeon: Gwenyth Ober, MD;  Location: [facility];  Service: General;  Laterality: N/A;  . PEG PLACEMENT N/A 04/01/2013   Procedure: PERCUTANEOUS ENDOSCOPIC GASTROSTOMY (PEG) PLACEMENT;  Surgeon: Gwenyth Ober, MD;  Location: [facility] ENDOSCOPY;  Service: General;  Laterality: N/A;  . TRACHEOSTOMY     feinstein    FAMILY HISTORY: Family History  Problem Relation Age of Onset  . Heart attack Father   . Lung cancer Brother   . Healthy Sister     SOCIAL HISTORY: Social History   Social History  . Marital status: Married    Spouse name: [name]   . Number of children: 2  . Years of education: 12+   Occupational History  . retired    Social History Main  Topics  . Smoking status: Former Smoker    Packs/day: 0.00    Types: Cigarettes    Quit date: 02/25/2003  . Smokeless tobacco: Never Used  . Alcohol use No     Comment: socailly.  . Drug use: No  . Sexual activity: Not on file   Other Topics Concern  . Not on file   Social History Narrative   Patient is married and lives with her husband. The patient is retired. Patient has 2 children and has a college education.    Patient is right handed.   Patient drinks very little caffeine.      PHYSICAL EXAM  Vitals:   12/05/15 1331  Weight: 142 lb 9.6 oz (64.7 kg)  Height: '5\' 1"'$  (1.549 m)   Body mass index is 26.94 kg/m.  Generalized: Well developed, in  no acute distress   Neurological examination  Mentation: Alert oriented to time, place, history taking. Follows all commands speech and language fluent Cranial nerve II-XII: Pupils were equal round reactive to light. Extraocular movements were full, visual field were full on confrontational test. Facial sensation and strength were normal. Uvula tongue midline. Head turning and shoulder shrug  were normal and symmetric. Motor: The motor testing reveals 5 over 5 strength of all 4 extremities. Good symmetric motor tone is noted throughout.  Sensory: Sensory testing is intact to soft touch on all 4 extremities. No evidence of extinction is noted.  Coordination: Cerebellar testing reveals good finger-nose-finger and heel-to-shin bilaterally.  Gait and station: gait is slightly unsteady. She uses a cane when ambulating. Tandem gait not attempted. Romberg is negative.   DIAGNOSTIC DATA (LABS, IMAGING, TESTING) - I reviewed patient records, labs, notes, testing and imaging myself where available.  Lab Results  Component Value Date   WBC 5.9 10/24/2014   HGB 12.0 09/05/2014   HCT 37.3 10/24/2014   MCV 89 10/24/2014   PLT 224 10/24/2014      Component Value Date/Time   NA 143 03/21/2015 1627   K 4.2 03/21/2015 1627   CL 104  03/21/2015 1627   CO2 23 03/21/2015 1627   GLUCOSE 103 (H) 03/21/2015 1627   GLUCOSE 104 (H) 06/04/2014 0350   BUN 15 03/21/2015 1627   CREATININE 0.74 03/21/2015 1627   CALCIUM 9.4 03/21/2015 1627   PROT 5.9 (L) 06/04/2014 0350   ALBUMIN 2.9 (L) 06/04/2014 0350   AST 23 06/04/2014 0350   ALT 32 06/04/2014 0350   ALKPHOS 89 06/04/2014 0350   BILITOT 1.0 06/04/2014 0350   GFRNONAA 73 03/21/2015 1627   GFRAA 84 03/21/2015 1627    Lab Results  Component Value Date   HGBA1C 6.7 12/05/2014   Lab Results  Component Value Date   VIFBPPHK32 761 12/05/2014   Lab Results  Component Value Date   TSH 1.873 06/02/2014      ASSESSMENT AND PLAN 82 y.o. year old female  has a past medical history of Abnormality of gait (11/22/2014); Arthritis; Cyst of breast, right, benign solitary; Dyslipidemia; H/O: hysterectomy; HHNC (hyperglycemic hyperosmolar nonketotic coma) ([place]) (05/14/2013); History of appendectomy; History of lumbosacral spine surgery; Hypertension; Inguinal hernia; and Restless leg syndrome. here with:  1. Restless leg syndrome 2. Stenosis of the lumbar region  In the past physicaltherapy has been beneficial for the patient leg discomfort. I will refer the patient for physical therapy. She will continue using Mirapex 0.5 mg daily. If her symptoms worsen or she develops any new symptoms she will let us know. Follow-up in 6 months or sooner if needed.   Ward Givens, MSN, NP-C 12/05/2015, 1:40 PM [facility] [address], [place], [place] [address] [phone]

## 2015-12-05 NOTE — Progress Notes (Signed)
I have read the note, and I agree with the clinical assessment and plan.  Shayanne Gomm KEITH   

## 2015-12-09 ENCOUNTER — Other Ambulatory Visit: Payer: Self-pay | Admitting: Internal Medicine

## 2015-12-11 ENCOUNTER — Telehealth: Payer: Self-pay | Admitting: Neurology

## 2015-12-11 NOTE — Telephone Encounter (Signed)
Galen Daft Physical Therapist with [facility] is calling to get verbal orders for PT 2 times a week for 4 weeks for the patient. Please call and advise.

## 2015-12-11 NOTE — Telephone Encounter (Signed)
LMOM for Kristi Alexander, that Kristi Givens, NP will sign PT orders--ok for PT 2 times per week for 4 more weeks.  Kristi Alexander saw pt. last and referred her to PT./fim

## 2015-12-19 ENCOUNTER — Encounter: Payer: Medicare Other | Admitting: Internal Medicine

## 2015-12-20 ENCOUNTER — Other Ambulatory Visit: Payer: Self-pay | Admitting: Internal Medicine

## 2016-01-08 ENCOUNTER — Other Ambulatory Visit: Payer: Self-pay | Admitting: Internal Medicine

## 2016-01-09 ENCOUNTER — Other Ambulatory Visit: Payer: Self-pay | Admitting: Internal Medicine

## 2016-02-04 ENCOUNTER — Ambulatory Visit (HOSPITAL_COMMUNITY)
Admission: EM | Admit: 2016-02-04 | Discharge: 2016-02-04 | Disposition: A | Discharge status: Home / Self Care | Payer: Medicare Other | Attending: Family Medicine | Admitting: Family Medicine

## 2016-02-04 ENCOUNTER — Encounter (HOSPITAL_COMMUNITY): Payer: Self-pay | Admitting: Emergency Medicine

## 2016-02-04 DIAGNOSIS — G8929 Other chronic pain: Secondary | ICD-10-CM | POA: Diagnosis not present

## 2016-02-04 DIAGNOSIS — M79604 Pain in right leg: Secondary | ICD-10-CM | POA: Diagnosis not present

## 2016-02-04 DIAGNOSIS — M791 Myalgia, unspecified site: Secondary | ICD-10-CM

## 2016-02-04 MED ORDER — CELECOXIB 100 MG PO CAPS: ORAL_CAPSULE | ORAL | 0 refills | Status: DC

## 2016-02-04 NOTE — Discharge Instructions (Signed)
Your leg function appears to be intact at this time. He may have to have someone help get your leg started moving in the morning before you are able to actively move it herself. Apply heat to the areas of pain periodically. Take the Celebrex as needed for pain and inflammation and follow up with your primary care doctor.

## 2016-02-04 NOTE — ED Triage Notes (Signed)
Patient has chronic issues with right leg.  Patient has a long history of fall earlier in the year and rehab.  Last few days pain has increased.  Described as a drawing sensation and leg gets cold

## 2016-02-04 NOTE — ED Provider Notes (Signed)
CSN: [id number]     Arrival date & time 02/04/16  1152 History   First MD Initiated Contact with Patient 02/04/16 1322     Chief Complaint  Patient presents with  . Hip Pain   (Consider location/radiation/quality/duration/timing/severity/associated sxs/prior Treatment) 82 year old females copy by her daughter who has a lengthy history of right lower extremity pain associated with stiffness and unusual sensations. She also has a history of fall where she apparently sprained her ankle she also has a history of spinal stenosis which she had surgery approximately 5 or 6 years ago. Her chronic diagnoses include arthritis, right lower extremity achiness and restless leg syndrome. This year she has been undergoing to courses of physical therapy which have helped some. In the last few days she states the stiffness and sensation with discomfort has increased. While in the wheelchair she demonstrated pretty good movement of the knee and the ankle. It is difficult to obtain a history as to what exactly has changed recently in regards to her chronic discomfort. She is currently fully dressed in jeans, coat and sitting in a wheelchair.      Past Medical History:  Diagnosis Date  . Abnormality of gait 11/22/2014  . Arthritis   . Cyst of breast, right, benign solitary   . Dyslipidemia   . H/O: hysterectomy   . HHNC (hyperglycemic hyperosmolar nonketotic coma) ([place]) 05/14/2013  . History of appendectomy   . History of lumbosacral spine surgery   . Hypertension   . Inguinal hernia   . Restless leg syndrome    Past Surgical History:  Procedure Laterality Date  . ABDOMINAL HYSTERECTOMY    . APPENDECTOMY    . BOWEL RESECTION N/A 03/23/2013   Procedure: SMALL BOWEL RESECTION;  Surgeon: Gwenyth Ober, MD;  Location: [facility];  Service: General;  Laterality: N/A;  . ESOPHAGOGASTRODUODENOSCOPY N/A 04/01/2013   Procedure: ESOPHAGOGASTRODUODENOSCOPY (EGD);  Surgeon: Gwenyth Ober, MD;  Location: [facility] ENDOSCOPY;   Service: General;  Laterality: N/A;  . LAPAROTOMY N/A 03/23/2013   Procedure: EXPLORATORY LAPAROTOMY;  Surgeon: Gwenyth Ober, MD;  Location: [facility];  Service: General;  Laterality: N/A;  . PEG PLACEMENT N/A 04/01/2013   Procedure: PERCUTANEOUS ENDOSCOPIC GASTROSTOMY (PEG) PLACEMENT;  Surgeon: Gwenyth Ober, MD;  Location: [facility] ENDOSCOPY;  Service: General;  Laterality: N/A;  . TRACHEOSTOMY     feinstein   Family History  Problem Relation Age of Onset  . Heart attack Father   . Lung cancer Brother   . Healthy Sister    Social History  Substance Use Topics  . Smoking status: Former Smoker    Packs/day: 0.00    Types: Cigarettes    Quit date: 02/25/2003  . Smokeless tobacco: Never Used  . Alcohol use No     Comment: socailly.   OB History    No data available     Review of Systems  Constitutional: Negative for activity change, chills and fever.  HENT: Negative.   Respiratory: Negative.   Cardiovascular: Negative.   Musculoskeletal:       As per HPI  Skin: Negative for color change, pallor and rash.  Neurological: Negative.   All other systems reviewed and are negative.   Allergies  Penicillins  Home Medications   Prior to Admission medications   Medication Sig Start Date End Date Taking? Authorizing Provider  amLODipine (NORVASC) 10 MG tablet TAKE ONE TABLET BY MOUTH ONCE DAILY 01/08/16   Maryellen Pile, MD  aspirin 81 MG tablet 81 mg by  Gastric Tube route daily.     Historical Provider, MD  celecoxib (CELEBREX) 100 MG capsule Take 1 cap po up to q 12h prn leg pain. Take with food. 02/04/16   Janne Napoleon, NP  ferrous sulfate 325 (65 FE) MG tablet Take 325 mg by mouth 2 (two) times daily with a meal.    Historical Provider, MD  hydrOXYzine (ATARAX/VISTARIL) 25 MG tablet TAKE ONE TABLET BY MOUTH ONCE DAILY AS NEEDED 01/09/16   Maryellen Pile, MD  isosorbide mononitrate (IMDUR) 30 MG 24 hr tablet TAKE ONE AND ONE HALF TABLET BY MOUTH ONCE DAILY 07/20/15   Maryellen Pile, MD   metoprolol succinate (TOPROL-XL) 25 MG 24 hr tablet Take 1 tablet (25 mg total) by mouth daily. 03/28/15   Maryellen Pile, MD  vitamin B-12 (CYANOCOBALAMIN) 1000 MCG tablet Take 1 tablet (1,000 mcg total) by mouth daily. 03/24/15   Maryellen Pile, MD  Vitamin D, Cholecalciferol, 1000 units CAPS Take 1,000 Units by mouth daily. 09/16/15   Maryellen Pile, MD  Vitamin D, Ergocalciferol, (DRISDOL) 50000 units CAPS capsule TAKE 1 CAPSULE BY MOUTH EVERY 7 DAYS 12/21/15   Maryellen Pile, MD   Meds Ordered and Administered this Visit  Medications - No data to display  BP 148/62 (BP Location: Left Arm)   Pulse 71   Temp 97.4 F (36.3 C) (Oral)   Resp 18   SpO2 100%  No data found.   Physical Exam  Constitutional: She is oriented to person, place, and time. She appears well-developed and well-nourished. No distress.  Neck: Normal range of motion. Neck supple.  Cardiovascular: Normal rate.   Pulmonary/Chest: No respiratory distress.  Musculoskeletal: Normal range of motion. She exhibits tenderness. She exhibits no edema or deformity.  Examination of the right lower extremity reveals no edema. Passive range of motion of the hip, knee and ankle is normal. After providing some helpful passive range of motion the patient is able to perform her own active movement of her right leg stating this is more than she has been able to do previously. She has minor tenderness along the lateral aspect of the right lower leg, right thigh into the hip. This is muscular tenderness. There is no swelling to the joints, the calf or other muscles. No discoloration. Distal neurovascular is intact although the bilateral pedal pulses are difficult to find and are one plus. Normal warmth and color. No evidence or suspicion for DVT.  Neurological: She is alert and oriented to person, place, and time.  Skin: Skin is warm and dry. Capillary refill takes less than 2 seconds.  Psychiatric: She has a normal mood and affect.  Nursing  note and vitals reviewed.   Urgent Care Course   Clinical Course     Procedures (including critical care time)  Labs Review Labs Reviewed - No data to display  Imaging Review No results found.   Visual Acuity Review  Right Eye Distance:   Left Eye Distance:   Bilateral Distance:    Right Eye Near:   Left Eye Near:    Bilateral Near:         MDM   1. Muscle tenderness   2. Muscle pain   3. Chronic pain of right lower extremity    Your leg function appears to be intact at this time. He may have to have someone help get your leg started moving in the morning before you are able to actively move it herself. Apply heat to the areas of pain periodically.  Take the Celebrex as needed for pain and inflammation and follow up with your primary care doctor. Meds ordered this encounter  Medications  . celecoxib (CELEBREX) 100 MG capsule    Sig: Take 1 cap po up to q 12h prn leg pain. Take with food.    Dispense:  20 capsule    Refill:  0    Order Specific Question:   Supervising Provider    Answer:   Robyn Haber [[id number]]       Janne Napoleon, NP 02/04/16 1434

## 2016-03-05 ENCOUNTER — Ambulatory Visit (INDEPENDENT_AMBULATORY_CARE_PROVIDER_SITE_OTHER): Payer: Medicare Other | Admitting: Internal Medicine

## 2016-03-05 ENCOUNTER — Encounter: Payer: Self-pay | Admitting: Internal Medicine

## 2016-03-05 ENCOUNTER — Other Ambulatory Visit: Payer: Self-pay | Admitting: Internal Medicine

## 2016-03-05 VITALS — BP 142/72 | HR 69 | Temp 97.9°F | Ht 61.0 in | Wt 141.8 lb

## 2016-03-05 DIAGNOSIS — M25512 Pain in left shoulder: Secondary | ICD-10-CM | POA: Diagnosis not present

## 2016-03-05 DIAGNOSIS — M810 Age-related osteoporosis without current pathological fracture: Secondary | ICD-10-CM

## 2016-03-05 DIAGNOSIS — I10 Essential (primary) hypertension: Secondary | ICD-10-CM | POA: Diagnosis not present

## 2016-03-05 DIAGNOSIS — E119 Type 2 diabetes mellitus without complications: Secondary | ICD-10-CM | POA: Diagnosis not present

## 2016-03-05 DIAGNOSIS — Z79899 Other long term (current) drug therapy: Secondary | ICD-10-CM

## 2016-03-05 DIAGNOSIS — Z87891 Personal history of nicotine dependence: Secondary | ICD-10-CM

## 2016-03-05 DIAGNOSIS — E118 Type 2 diabetes mellitus with unspecified complications: Secondary | ICD-10-CM

## 2016-03-05 DIAGNOSIS — E559 Vitamin D deficiency, unspecified: Secondary | ICD-10-CM | POA: Diagnosis not present

## 2016-03-05 DIAGNOSIS — Z0000 Encounter for general adult medical examination without abnormal findings: Secondary | ICD-10-CM

## 2016-03-05 DIAGNOSIS — Z87310 Personal history of (healed) osteoporosis fracture: Secondary | ICD-10-CM

## 2016-03-05 LAB — POCT GLYCOSYLATED HEMOGLOBIN (HGB A1C): Hemoglobin A1C: 6

## 2016-03-05 LAB — GLUCOSE, CAPILLARY: Glucose-Capillary: 104 mg/dL — ABNORMAL HIGH (ref 65–99)

## 2016-03-05 NOTE — Progress Notes (Signed)
   CC: HTN  HPI:  [name] is a 83 y.o. female with a past medical history listed below here today for follow up of her HTN.   HTN - BP today 142/72. Currently taking Amlodipine 10 mg, Imdur 45 mg, losartan 100 mg, metoprolol succinate 25 mg. Checking at home. Reports it runs around 381-829H systolic at home. No log today. No headaches, dizziness/lightheadedness, N/V, visual disturbances.   Osteoporosis - Had complaints of left hip pain in 11/2015 after starting bisphonate therapy in 08/2015 following fragility fracture of her metatarsal. XR done 11/2015 negative. Never had a DEXA scan. Tried to reach patient with the results and consideration of possible MRI scan for further investigation of possible occult fracture but was never able to get in contact. Was also referred for DEXA scan but does not appear to have been done. Today, she reports she is having right knee and hip pain. Has occasional left hip pain but the right his is the worse one now. Has some aching pain along the lateral aspect of her right leg form her hip to her foot.   She was started on Vit D supplementation (50,000 x 8 weeks and then 1000 daily thereafter but reports not taking any after the initial 8 week dosing) after Vit D levels were noted to be 14 at 11/2015 visit. Re-check vit D level today.  Left shoulder pain - Had steroid injection in left shoulder at 11/2015 visit. Today she reports that it helped for about two months. Now has problems with it every once and a while but not consistently anymore. Having difficulty with range of movement due to pain.   Hyperglycemia - Patient's HgbA1c was 6.7 in 11/2015. Has a history of elevated blood glucoses in the past but unclear if they were fasting or random with none about 200. Will repeat testing before giving diagnosis of diabetes. Denies any polydipsia or polyuria.   Preventative Health Care - Got Prevnar 13 11/2015. Will need PPSV23 in 11/2016. Needs tetanus  booster.  Past Medical History:  Diagnosis Date  . Abnormality of gait 11/22/2014  . Arthritis   . Cyst of breast, right, benign solitary   . Dyslipidemia   . H/O: hysterectomy   . HHNC (hyperglycemic hyperosmolar nonketotic coma) ([place]) 05/14/2013  . History of appendectomy   . History of lumbosacral spine surgery   . Hypertension   . Inguinal hernia   . Restless leg syndrome     Review of Systems:   Negative except as noted in HPI  Physical Exam:  Vitals:   03/05/16 1434  BP: (!) 142/72  Pulse: 69  Temp: 97.9 F (36.6 C)  TempSrc: Oral  SpO2: 99%  Weight: 141 lb 12.8 oz (64.3 kg)  Height: '5\' 1"'$  (1.549 m)   GENERAL- alert, co-operative, appears as stated age, not in any distress. HEENT- Atraumatic, normocephalic, PERRL, EOMI, oral mucosa appears moist CARDIAC- RRR, no murmurs, rubs or gallops. RESP- Moving equal volumes of air, and clear to auscultation bilaterally, no wheezes or crackles. ABDOMEN- Soft, nontender, bowel sounds present. EXTREMITIES- shoulder with good passive range of movement. Active range of movement limited by pain. Left hip non-tender to palpation. Strength 5/5 in bilateral LE. Intact sensation. Abnormal shuffling gait with right sided preference.  SKIN- Warm, dry, No rash or lesion.  Assessment & Plan:   See Encounters Tab for problem based charting.  Patient seen with Dr. Angelia Mould

## 2016-03-05 NOTE — Patient Instructions (Addendum)
Kristi Alexander,  I am checking your vitamin D level today. I would like you to go back on the 1000 units daily in the meantime.  I would like to see you back in 6 months for follow up.

## 2016-03-06 LAB — LIPID PANEL
CHOL/HDL RATIO: 2.6 ratio (ref 0.0–4.4)
Cholesterol, Total: 226 mg/dL — ABNORMAL HIGH (ref 100–199)
HDL: 86 mg/dL (ref >39)
LDL CALC: 125 mg/dL — AB (ref 0–99)
Triglycerides: 77 mg/dL (ref 0–149)
VLDL CHOLESTEROL CAL: 15 mg/dL (ref 5–40)

## 2016-03-06 LAB — VITAMIN D 25 HYDROXY (VIT D DEFICIENCY, FRACTURES): Vit D, 25-Hydroxy: 18.1 ng/mL — ABNORMAL LOW (ref 30.0–100.0)

## 2016-03-06 MED ORDER — VITAMIN D (CHOLECALCIFEROL) 25 MCG (1000 UT) PO CAPS: 1000.0000 [IU] | ORAL_CAPSULE | Freq: Every day | ORAL | 0 refills | Status: DC

## 2016-03-07 NOTE — Assessment & Plan Note (Signed)
BP Readings from Last 3 Encounters:  03/05/16 (!) 142/72  02/04/16 148/62  12/05/15 (!) 158/83    Lab Results  Component Value Date   NA 143 03/21/2015   K 4.2 03/21/2015   CREATININE 0.74 03/21/2015    Assessment: BP today 142/72. Currently taking Amlodipine 10 mg, Imdur 45 mg, losartan 100 mg, metoprolol succinate 25 mg. Checking at home. Reports it runs around 195-093O systolic at home. No log today. No headaches, dizziness/lightheadedness, N/V, visual disturbances.   Plan: Continue current medications

## 2016-03-07 NOTE — Assessment & Plan Note (Signed)
Had complaints of left hip pain in 11/2015 after starting bisphonate therapy in 08/2015 following fragility fracture of her metatarsal. XR done 11/2015 negative. Never had a DEXA scan. Tried to reach patient with the results and consideration of possible MRI scan for further investigation of possible occult fracture but was never able to get in contact. Was also referred for DEXA scan but does not appear to have been done. Today, she reports she is having right knee and hip pain. Has occasional left hip pain but the right his is the worse one now. Has some aching pain along the lateral aspect of her right leg form her hip to her foot.   She was started on Vit D supplementation (50,000 x 8 weeks and then 1000 daily thereafter but reports not taking any after the initial 8 week dosing) after Vit D levels were noted to be 14 at 11/2015 visit. Re-check vit D level today.  A/P Checking Vit D level today DEXA scan  Addendum Curiously, Vit D is still only 18 despite replacement. Will need to continue aggressive replacement.

## 2016-03-07 NOTE — Assessment & Plan Note (Signed)
Got Prevnar 13 11/2015. Will need PPSV23 in 11/2016. Needs tetanus booster at next visit.

## 2016-03-07 NOTE — Assessment & Plan Note (Signed)
Patient's HgbA1c was 6.7 in 11/2015. Has a history of elevated blood glucoses in the past but unclear if they were fasting or random with none about 200. Will repeat testing before giving diagnosis of diabetes. Denies any polydipsia or polyuria.   A/P  A1c 6.0 today off any medications Continue to monitor

## 2016-03-07 NOTE — Assessment & Plan Note (Signed)
Had steroid injection in left shoulder at 11/2015 visit. Today she reports that it helped for about two months. Now has problems with it every once and a while but not consistently anymore. Having difficulty with range of movement due to pain.   After consent was obtained, using sterile technique the left shoulder was prepped and plain Lidocaine 1% was used as local anesthetic. Steroid 1 mg Kenalog and 1 ml plain Lidocaine was then injected and the needle withdrawn.  The procedure was well tolerated.  The patient is asked to continue to rest the joint for a few more days before resuming regular activities.  It may be more painful for the first 1-2 days.  Watch for fever, or increased swelling or persistent pain in the joint. Call or return to clinic prn if such symptoms occur or there is failure to improve as anticipated.

## 2016-03-11 NOTE — Progress Notes (Signed)
Internal Medicine Clinic Attending  I saw and evaluated the patient.  I personally confirmed the key portions of the history and exam documented by Dr. Charlynn Grimes and I reviewed pertinent patient test results.  The assessment, diagnosis, and plan were formulated together and I agree with the documentation in the resident's note. I was present for the entire procedure.

## 2016-03-25 ENCOUNTER — Other Ambulatory Visit: Payer: Self-pay | Admitting: Chiropractic Medicine

## 2016-03-25 ENCOUNTER — Ambulatory Visit
Admission: RE | Admit: 2016-03-25 | Discharge: 2016-03-25 | Disposition: A | Discharge status: Home / Self Care | Payer: Medicare Other | Source: Ambulatory Visit | Attending: Chiropractic Medicine | Admitting: Chiropractic Medicine

## 2016-03-25 DIAGNOSIS — M545 Low back pain: Secondary | ICD-10-CM

## 2016-04-02 ENCOUNTER — Other Ambulatory Visit: Payer: Self-pay | Admitting: Neurology

## 2016-04-19 ENCOUNTER — Other Ambulatory Visit: Payer: Self-pay | Admitting: Internal Medicine

## 2016-05-11 ENCOUNTER — Other Ambulatory Visit: Payer: Self-pay | Admitting: Internal Medicine

## 2016-06-11 ENCOUNTER — Ambulatory Visit: Payer: Medicare Other | Admitting: Neurology

## 2016-06-19 ENCOUNTER — Encounter: Payer: Self-pay | Admitting: Internal Medicine

## 2016-06-19 ENCOUNTER — Encounter (INDEPENDENT_AMBULATORY_CARE_PROVIDER_SITE_OTHER): Payer: Self-pay

## 2016-06-19 ENCOUNTER — Ambulatory Visit (INDEPENDENT_AMBULATORY_CARE_PROVIDER_SITE_OTHER): Payer: Medicare Other | Admitting: Internal Medicine

## 2016-06-19 VITALS — BP 176/69 | HR 60 | Temp 97.8°F | Wt 140.5 lb

## 2016-06-19 DIAGNOSIS — R252 Cramp and spasm: Secondary | ICD-10-CM | POA: Diagnosis not present

## 2016-06-19 DIAGNOSIS — I10 Essential (primary) hypertension: Secondary | ICD-10-CM | POA: Diagnosis not present

## 2016-06-19 DIAGNOSIS — Z87891 Personal history of nicotine dependence: Secondary | ICD-10-CM

## 2016-06-19 DIAGNOSIS — Z79899 Other long term (current) drug therapy: Secondary | ICD-10-CM | POA: Diagnosis not present

## 2016-06-19 MED ORDER — LOSARTAN POTASSIUM-HCTZ 100-25 MG PO TABS: 1.0000 | ORAL_TABLET | Freq: Every day | ORAL | 11 refills | Status: DC

## 2016-06-19 NOTE — Patient Instructions (Signed)
Ms. Arseneault,  For the leg cramping, we are checking some blood work to make sure you don't have any electrolyte abnormalities today. I don't expect anything to be abnormal there but I will let you know if anything comes up.  I would like for you to stop the tramadol. If you have more cramping I would recommend taking a teaspoon of mustard or vinegar.   Please follow up with me in June.

## 2016-06-19 NOTE — Progress Notes (Signed)
   CC: Right leg pain  HPI:  Kristi Alexander is a 83 y.o. female with a past medical history listed below here today with complaints of right leg pain.  For details of today's visit and the status of her chronic medical issues please refer to the assessment and plan.   Past Medical History:  Diagnosis Date  . Abnormality of gait 11/22/2014  . Arthritis   . Cyst of breast, right, benign solitary   . Dyslipidemia   . H/O: hysterectomy   . HHNC (hyperglycemic hyperosmolar nonketotic coma) ([place]) 05/14/2013  . History of appendectomy   . History of lumbosacral spine surgery   . Hypertension   . Inguinal hernia   . Restless leg syndrome     Review of Systems:   Review of Systems  Respiratory: Negative for shortness of breath.   Cardiovascular: Negative for chest pain.  Musculoskeletal: Negative for falls.     Physical Exam:  Vitals:   06/19/16 0925  BP: (!) 185/63  Pulse: 63  Temp: 97.8 F (36.6 C)  TempSrc: Oral  Weight: 140 lb 8 oz (63.7 kg)   Physical Exam  Constitutional: She is oriented to person, place, and time.  Thin, frail, elderly woman in no acute distress  Cardiovascular: Normal rate and regular rhythm.  Exam reveals no gallop and no friction rub.   No murmur heard. Pulmonary/Chest: Effort normal and breath sounds normal.  Abdominal: Soft. Bowel sounds are normal.  Musculoskeletal: Normal range of motion. She exhibits no edema or tenderness.  Neurological: She is alert and oriented to person, place, and time. She has normal sensation and normal strength.  Skin: Skin is warm and dry.  Vitals reviewed.   Assessment & Plan:   See Encounters Tab for problem based charting.  Patient seen with Dr. Evette Doffing

## 2016-06-20 DIAGNOSIS — R252 Cramp and spasm: Secondary | ICD-10-CM | POA: Insufficient documentation

## 2016-06-20 LAB — MAGNESIUM: Magnesium: 2 mg/dL (ref 1.6–2.3)

## 2016-06-20 LAB — BMP8+ANION GAP
ANION GAP: 15 mmol/L (ref 10.0–18.0)
BUN/Creatinine Ratio: 23 (ref 12–28)
BUN: 19 mg/dL (ref 8–27)
CALCIUM: 9.5 mg/dL (ref 8.7–10.3)
CO2: 25 mmol/L (ref 18–29)
Chloride: 100 mmol/L (ref 96–106)
Creatinine, Ser: 0.82 mg/dL (ref 0.57–1.00)
GFR, EST AFRICAN AMERICAN: 73 mL/min/{1.73_m2} (ref >59)
GFR, EST NON AFRICAN AMERICAN: 64 mL/min/{1.73_m2} (ref >59)
GLUCOSE: 105 mg/dL — AB (ref 65–99)
Potassium: 4.7 mmol/L (ref 3.5–5.2)
Sodium: 140 mmol/L (ref 134–144)

## 2016-06-20 LAB — VITAMIN D 25 HYDROXY (VIT D DEFICIENCY, FRACTURES): VIT D 25 HYDROXY: 16.4 ng/mL — AB (ref 30.0–100.0)

## 2016-06-20 MED ORDER — LOSARTAN POTASSIUM 100 MG PO TABS: 100.0000 mg | ORAL_TABLET | Freq: Every day | ORAL | 11 refills | Status: DC

## 2016-06-20 NOTE — Assessment & Plan Note (Signed)
Ms. Cousar presents today with complaints of right leg pain. She reports that for the past several months she has been having frequent muscle cramp pain in her right leg. Reports the pain is mainly in her right posterior thigh and anterior shin. Occurs throughout the day. Does report trying mustard with relief of symptoms. She tells me she has been seeing a chiropractor and had some benefit but the pain became worse last week and she went to urgent care to be seen. Reports being given a prescription for a prednisone taper and tramadol at that time; which she has completed. Unfortunately records are not in our system of this visit.  Assessment: muscle cramps  Plan:  BMET and Mg with no abnormalities today Instructed to try vinegar or mustard when the cramps happen

## 2016-06-20 NOTE — Progress Notes (Signed)
Internal Medicine Clinic Attending  I saw and evaluated the patient.  I personally confirmed the key portions of the history and exam documented by Dr. Boswell and I reviewed pertinent patient test results.  The assessment, diagnosis, and plan were formulated together and I agree with the documentation in the resident's note. 

## 2016-06-20 NOTE — Assessment & Plan Note (Signed)
BP Readings from Last 3 Encounters:  06/19/16 (!) 176/69  03/05/16 (!) 142/72  02/04/16 148/62    Lab Results  Component Value Date   NA 140 06/19/2016   K 4.7 06/19/2016   CREATININE 0.82 06/19/2016   BP 185/63 today.  Currently taking Amlodipine 10 mg, Imdur 45 mg, losartan 100 mg, metoprolol succinate 25 mg. However, it appears losartan has fallen off her medication list. No clear notes about losartan being discontinued. She reports she stopped it herself at one point but says she has since started taking it again.   Assessment: uncontrolled htn  Plan: Re-order losartan 100 mg daily today.

## 2016-06-23 ENCOUNTER — Encounter (HOSPITAL_COMMUNITY): Payer: Self-pay | Admitting: *Deleted

## 2016-06-23 ENCOUNTER — Emergency Department (HOSPITAL_COMMUNITY)
Admission: EM | Admit: 2016-06-23 | Discharge: 2016-06-23 | Disposition: A | Discharge status: Home / Self Care | Payer: Medicare Other | Attending: Emergency Medicine | Admitting: Emergency Medicine

## 2016-06-23 DIAGNOSIS — I5032 Chronic diastolic (congestive) heart failure: Secondary | ICD-10-CM | POA: Diagnosis not present

## 2016-06-23 DIAGNOSIS — I110 Hypertensive heart disease with heart failure: Secondary | ICD-10-CM | POA: Diagnosis not present

## 2016-06-23 DIAGNOSIS — E119 Type 2 diabetes mellitus without complications: Secondary | ICD-10-CM | POA: Diagnosis not present

## 2016-06-23 DIAGNOSIS — Z87891 Personal history of nicotine dependence: Secondary | ICD-10-CM | POA: Insufficient documentation

## 2016-06-23 DIAGNOSIS — Z79899 Other long term (current) drug therapy: Secondary | ICD-10-CM | POA: Insufficient documentation

## 2016-06-23 DIAGNOSIS — M79605 Pain in left leg: Secondary | ICD-10-CM

## 2016-06-23 DIAGNOSIS — Z7901 Long term (current) use of anticoagulants: Secondary | ICD-10-CM | POA: Diagnosis not present

## 2016-06-23 DIAGNOSIS — M79662 Pain in left lower leg: Secondary | ICD-10-CM | POA: Insufficient documentation

## 2016-06-23 DIAGNOSIS — M79661 Pain in right lower leg: Secondary | ICD-10-CM | POA: Diagnosis not present

## 2016-06-23 DIAGNOSIS — M79604 Pain in right leg: Secondary | ICD-10-CM

## 2016-06-23 LAB — CBC WITH DIFFERENTIAL/PLATELET
BASOS ABS: 0 10*3/uL (ref 0.0–0.1)
BASOS PCT: 0 %
EOS ABS: 0.2 10*3/uL (ref 0.0–0.7)
Eosinophils Relative: 2 %
HCT: 38.6 % (ref 36.0–46.0)
HEMOGLOBIN: 12.8 g/dL (ref 12.0–15.0)
Lymphocytes Relative: 24 %
Lymphs Abs: 2.4 10*3/uL (ref 0.7–4.0)
MCH: 28.1 pg (ref 26.0–34.0)
MCHC: 33.2 g/dL (ref 30.0–36.0)
MCV: 84.8 fL (ref 78.0–100.0)
MONOS PCT: 6 %
Monocytes Absolute: 0.6 10*3/uL (ref 0.1–1.0)
NEUTROS ABS: 6.8 10*3/uL (ref 1.7–7.7)
NEUTROS PCT: 68 %
PLATELETS: 238 10*3/uL (ref 150–400)
RBC: 4.55 MIL/uL (ref 3.87–5.11)
RDW: 19.1 % — ABNORMAL HIGH (ref 11.5–15.5)
WBC: 10 10*3/uL (ref 4.0–10.5)

## 2016-06-23 LAB — COMPREHENSIVE METABOLIC PANEL
ALBUMIN: 3.5 g/dL (ref 3.5–5.0)
ALK PHOS: 73 U/L (ref 38–126)
ALT: 27 U/L (ref 14–54)
AST: 22 U/L (ref 15–41)
Anion gap: 9 (ref 5–15)
BUN: 22 mg/dL — ABNORMAL HIGH (ref 6–20)
CHLORIDE: 107 mmol/L (ref 101–111)
CO2: 22 mmol/L (ref 22–32)
CREATININE: 0.96 mg/dL (ref 0.44–1.00)
Calcium: 9.2 mg/dL (ref 8.9–10.3)
GFR calc non Af Amer: 51 mL/min — ABNORMAL LOW (ref >60)
GFR, EST AFRICAN AMERICAN: 59 mL/min — AB (ref >60)
GLUCOSE: 96 mg/dL (ref 65–99)
Potassium: 3.8 mmol/L (ref 3.5–5.1)
SODIUM: 138 mmol/L (ref 135–145)
Total Bilirubin: 0.5 mg/dL (ref 0.3–1.2)
Total Protein: 6.4 g/dL — ABNORMAL LOW (ref 6.5–8.1)

## 2016-06-23 MED ORDER — ACETAMINOPHEN 500 MG PO TABS: 1000.0000 mg | ORAL_TABLET | Freq: Three times a day (TID) | ORAL | 0 refills | Status: AC

## 2016-06-23 NOTE — ED Provider Notes (Signed)
[facility] DEPT Provider Note   CSN: [id number] Arrival date & time: 06/23/16  1241     History   Chief Complaint Chief Complaint  Patient presents with  . Leg Pain    HPI Kristi Alexander is a 83 y.o. female.  The history is provided by the patient.  Leg Pain   This is a recurrent problem. Episode onset: several weeks. Episode frequency: sporadic. The problem has not changed since onset.Pain location: bilateral hamstrings. The quality of the pain is described as aching. The pain is moderate. Pertinent negatives include no numbness, full range of motion, no stiffness, no tingling and no itching. She has tried OTC pain medications for the symptoms. There has been no history of extremity trauma. Family history is significant for no rheumatoid arthritis and no gout.   Patient reports that the pain is migratory and moves from the right leg to the left. Pain is exacerbated with hip flexion periodically.  Denies any prior DVTs. Denies any bladder or bowel incontinence, saddle anesthesia, or new lower extremity weakness. She doesn endorse right leg weakness due to spinal surgery. No back pain.  At baseline patient uses a cane to ambulate.  Past Medical History:  Diagnosis Date  . Abnormality of gait 11/22/2014  . Arthritis   . Cyst of breast, right, benign solitary   . Dyslipidemia   . H/O: hysterectomy   . HHNC (hyperglycemic hyperosmolar nonketotic coma) ([place]) 05/14/2013  . History of appendectomy   . History of lumbosacral spine surgery   . Hypertension   . Inguinal hernia   . Restless leg syndrome     Patient Active Problem List   Diagnosis Date Noted  . Leg cramps 06/20/2016  . Vitamin D deficiency 11/28/2015  . Left hip pain 11/28/2015  . Osteoporosis 09/16/2015  . Pain in joint, shoulder region 09/16/2015  . Estrogen deficiency 03/14/2015  . Spinal stenosis of lumbar region 01/24/2015  . Myalgia 01/11/2015  . Preventative health care 12/11/2014  . Essential  hypertension 12/11/2014  . Pulmonary hypertension ([place])   . Chronic cough   . Chronic diastolic heart failure ([place]) 05/15/2013  . Diabetes ([place]) 05/14/2013  . Other vitamin B12 deficiency anemia 07/13/2012  . Restless legs syndrome (RLS) 07/13/2012    Past Surgical History:  Procedure Laterality Date  . ABDOMINAL HYSTERECTOMY    . APPENDECTOMY    . BOWEL RESECTION N/A 03/23/2013   Procedure: SMALL BOWEL RESECTION;  Surgeon: Gwenyth Ober, MD;  Location: [facility];  Service: General;  Laterality: N/A;  . ESOPHAGOGASTRODUODENOSCOPY N/A 04/01/2013   Procedure: ESOPHAGOGASTRODUODENOSCOPY (EGD);  Surgeon: Gwenyth Ober, MD;  Location: [facility] ENDOSCOPY;  Service: General;  Laterality: N/A;  . LAPAROTOMY N/A 03/23/2013   Procedure: EXPLORATORY LAPAROTOMY;  Surgeon: Gwenyth Ober, MD;  Location: [facility];  Service: General;  Laterality: N/A;  . PEG PLACEMENT N/A 04/01/2013   Procedure: PERCUTANEOUS ENDOSCOPIC GASTROSTOMY (PEG) PLACEMENT;  Surgeon: Gwenyth Ober, MD;  Location: [facility] ENDOSCOPY;  Service: General;  Laterality: N/A;  . TRACHEOSTOMY     feinstein    OB History    No data available       Home Medications    Prior to Admission medications   Medication Sig Start Date End Date Taking? Authorizing Provider  acetaminophen (TYLENOL) 500 MG tablet Take 2 tablets (1,000 mg total) by mouth every 8 (eight) hours. Do not take more than 4000 mg of acetaminophen (Tylenol) in a 24-hour period. Please note that other medicines that you  may be prescribed may have Tylenol as well. 06/23/16 06/28/16  Fatima Blank, MD  amLODipine (NORVASC) 10 MG tablet TAKE ONE TABLET BY MOUTH ONCE DAILY 05/13/16   Maryellen Pile, MD  aspirin 81 MG tablet 81 mg by Gastric Tube route daily.     Historical Provider, MD  celecoxib (CELEBREX) 100 MG capsule Take 1 cap po up to q 12h prn leg pain. Take with food. 02/04/16   Janne Napoleon, NP  ferrous sulfate 325 (65 FE) MG tablet Take 325 mg by mouth 2 (two) times daily with a  meal.    Historical Provider, MD  hydrOXYzine (ATARAX/VISTARIL) 25 MG tablet TAKE ONE TABLET BY MOUTH ONCE DAILY AS NEEDED 01/09/16   Maryellen Pile, MD  isosorbide mononitrate (IMDUR) 30 MG 24 hr tablet TAKE ONE AND ONE HALF TABLET BY MOUTH ONCE DAILY 07/20/15   Maryellen Pile, MD  losartan (COZAAR) 100 MG tablet Take 1 tablet (100 mg total) by mouth daily. 06/20/16 06/20/17  Maryellen Pile, MD  metoprolol succinate (TOPROL-XL) 25 MG 24 hr tablet TAKE ONE TABLET BY MOUTH ONCE DAILY 04/21/16   Maryellen Pile, MD  pramipexole (MIRAPEX) 0.5 MG tablet TAKE ONE TABLET BY MOUTH ONCE DAILY 04/02/16   Kathrynn Ducking, MD  vitamin B-12 (CYANOCOBALAMIN) 1000 MCG tablet Take 1 tablet (1,000 mcg total) by mouth daily. 03/24/15   Maryellen Pile, MD  Vitamin D, Cholecalciferol, 1000 units CAPS Take 1,000 Units by mouth daily. 03/06/16   Maryellen Pile, MD    Family History Family History  Problem Relation Age of Onset  . Heart attack Father   . Lung cancer Brother   . Healthy Sister     Social History Social History  Substance Use Topics  . Smoking status: Former Smoker    Packs/day: 0.00    Types: Cigarettes    Quit date: 02/25/2003  . Smokeless tobacco: Never Used  . Alcohol use No     Comment: socailly.     Allergies   Penicillins   Review of Systems Review of Systems  Musculoskeletal: Negative for stiffness.  Skin: Negative for itching.  Neurological: Negative for tingling and numbness.  All other systems are reviewed and are negative for acute change except as noted in the HPI    Physical Exam Updated Vital Signs BP (!) 151/64   Pulse 72   Temp 98 F (36.7 C) (Oral)   Resp 20   Ht '5\' 2"'$  (1.575 m)   Wt 128 lb 7 oz (58.3 kg)   SpO2 99%   BMI 23.49 kg/m   Physical Exam  Constitutional: She is oriented to person, place, and time. She appears well-developed and well-nourished. No distress.  HENT:  Head: Normocephalic and atraumatic.  Right Ear: External ear normal.  Left Ear:  External ear normal.  Nose: Nose normal.  Eyes: Conjunctivae and EOM are normal. No scleral icterus.  Neck: Normal range of motion and phonation normal.  Cardiovascular: Normal rate and regular rhythm.   Pulmonary/Chest: Effort normal. No stridor. No respiratory distress.  Abdominal: She exhibits no distension.  Musculoskeletal: Normal range of motion. She exhibits no edema.  Neurological: She is alert and oriented to person, place, and time.  Spine Exam: Strength: 5/5 throughout LE bilaterally (hip flexion/extension, adduction/abduction; knee flexion/extension; foot dorsiflexion/plantarflexion, inversion/eversion; great toe inversion) Sensation: Intact to light touch in proximal and distal LE bilaterally Reflexes: 1+ quadriceps and achilles reflexes  Skin: She is not diaphoretic.  Psychiatric: She has a normal mood and affect. Her  behavior is normal.  Vitals reviewed.    ED Treatments / Results  Labs (all labs ordered are listed, but only abnormal results are displayed) Labs Reviewed  CBC WITH DIFFERENTIAL/PLATELET - Abnormal; Notable for the following:       Result Value   RDW 19.1 (*)    All other components within normal limits  COMPREHENSIVE METABOLIC PANEL - Abnormal; Notable for the following:    BUN 22 (*)    Total Protein 6.4 (*)    GFR calc non Af Amer 51 (*)    GFR calc Af Amer 59 (*)    All other components within normal limits    EKG  EKG Interpretation None       Radiology No results found.  Procedures Procedures (including critical care time)  Medications Ordered in ED Medications - No data to display   Initial Impression / Assessment and Plan / ED Course  I have reviewed the triage vital signs and the nursing notes.  Pertinent labs & imaging results that were available during my care of the patient were reviewed by me and considered in my medical decision making (see chart for details).     Presentation is not consistent with acute arterial  occlusion, DVTs, cauda equina. No trauma suggestive of fractures or dislocations. Screening labs grossly reassuring without electrolyte derangements that would account for muscle spasms. No evidence to suggest emergent etiology.  The patient is safe for discharge with strict return precautions.   Final Clinical Impressions(s) / ED Diagnoses   Final diagnoses:  Pain in both lower extremities   Disposition: Discharge  Condition: Good  I have discussed the results, Dx and Tx plan with the patient who expressed understanding and agree(s) with the plan. Discharge instructions discussed at great length. The patient was given strict return precautions who verbalized understanding of the instructions. No further questions at time of discharge.    New Prescriptions   ACETAMINOPHEN (TYLENOL) 500 MG TABLET    Take 2 tablets (1,000 mg total) by mouth every 8 (eight) hours. Do not take more than 4000 mg of acetaminophen (Tylenol) in a 24-hour period. Please note that other medicines that you may be prescribed may have Tylenol as well.    Follow Up: Primary care provider         Fatima Blank, MD 06/23/16 [phone]

## 2016-06-23 NOTE — ED Triage Notes (Signed)
Pt c/o R leg tingling & bil buttocks tingling, pt states, "the numbness moves around." onset yesterday, denies all other syptoms, no facial droop, no slurred speech present

## 2016-07-02 ENCOUNTER — Encounter: Payer: Self-pay | Admitting: *Deleted

## 2016-07-17 ENCOUNTER — Telehealth: Payer: Self-pay | Admitting: Internal Medicine

## 2016-07-21 ENCOUNTER — Other Ambulatory Visit: Payer: Self-pay | Admitting: Internal Medicine

## 2016-07-28 ENCOUNTER — Ambulatory Visit (INDEPENDENT_AMBULATORY_CARE_PROVIDER_SITE_OTHER): Payer: Medicare Other | Admitting: Orthopaedic Surgery

## 2016-07-28 ENCOUNTER — Ambulatory Visit (INDEPENDENT_AMBULATORY_CARE_PROVIDER_SITE_OTHER): Payer: Medicare Other

## 2016-07-28 ENCOUNTER — Encounter (INDEPENDENT_AMBULATORY_CARE_PROVIDER_SITE_OTHER): Payer: Self-pay | Admitting: Orthopaedic Surgery

## 2016-07-28 VITALS — BP 161/77 | HR 76 | Ht 62.0 in | Wt 140.0 lb

## 2016-07-28 DIAGNOSIS — M542 Cervicalgia: Secondary | ICD-10-CM

## 2016-07-28 DIAGNOSIS — G8929 Other chronic pain: Secondary | ICD-10-CM | POA: Diagnosis not present

## 2016-07-28 DIAGNOSIS — M48062 Spinal stenosis, lumbar region with neurogenic claudication: Secondary | ICD-10-CM

## 2016-07-28 DIAGNOSIS — M545 Low back pain: Secondary | ICD-10-CM

## 2016-07-28 NOTE — Progress Notes (Signed)
Office Visit Note   Patient: Kristi Alexander           Date of Birth: 1925-03-23           MRN: [id number] Visit Date: 07/28/2016              Requested by: Maryellen Pile, MD [address] [place], [place] [id number] PCP: Maryellen Pile, MD   Assessment & Plan: Visit Diagnoses:  1. Neck pain   2. Chronic bilateral low back pain, with sciatica presence unspecified   3. Spinal stenosis of lumbar region with neurogenic claudication     Plan: We'll obtain the ABIs to evaluate arterial flow with absent dorsalis pedis. Her pain on the right is most likely related to severe progressive stenosis at L4-5. Will obtain an MRI scan see her back after the scan for review. I discussed with her that there is no medication that will help with the pain if the L4-5 stenosis as the cause of her pain symptoms.  Follow-Up Instructions: After low extremity ABIs and lumbar MRI scan.  Orders:  Orders Placed This Encounter  Procedures  . XR Cervical Spine 2 or 3 views  . MR Lumbar Spine w/o contrast   No orders of the defined types were placed in this encounter.     Procedures: No procedures performed   Clinical Data: No additional findings.   Subjective: Chief Complaint  Patient presents with  . Lower Back - Pain  . Right Leg - Pain  . Left Leg - Pain    HPI 83 year old female seen with the more than a year history of progressive the bilateral buttocks pain cramping pain worst pain rating down her right leg down to her right ankle. She had previous right L3-4 HNP surgery 2011 did well for several years. In the interim she had some problems with intestinal blockage. Occasions after that required tracheostomy she was on a ventilator for. Time. This was in 2015. She's had chiropractic treatments, physical therapy, sides, prednisone pack, tramadol. She is taking some Tylenol currently. She has a walker at home but does not like to use that she uses a 4 prong cane in her right hand. Pain is  worse when she stands she can lean over grocery cart and go to the store. Without the grocery cart she cannot make it. Sometimes she can stand for 10 minutes of the time she has increasing buttocks pain and cramping. She gets some relief lying down it time she has increased back pain leg pain even when she tries to sleep. Previous MRI lumbar 2016 showed no evidence of distant recurrence at L3-4 on the right. Progression of stenosis to a severe level at L4-5 with narrowing down to 4-5 mm AP diameter and bilateral lateral recess stenosis with foraminal narrowing at L4-5. Moderate narrowing on the right at L5-S1. No associated bowel or bladder symptoms. She related that she had had 3 days of some diarrhea but prior to that no problems.  Review of Systems 14 point review of systems positive for history of osteoporosis, chronic diastolic heart failure, hypertension, B-12 deficiency, restless legs, diabetes, pulmonary hypertension, lumbar spinal stenosis L4-5, previous microdiscectomy right L3-4, intestinal blockage with the postoperative pulmonary complications with tracheostomy which is closed up. Otherwise negative as it pertains to history of present illness.  Objective: Vital Signs: BP (!) 161/77   Pulse 76   Ht 5\' 2"  (1.575 m)   Wt 140 lb (63.5 kg)   BMI 25.61 kg/m  Physical Exam  Constitutional: She is oriented to person, place, and time. She appears well-developed.  HENT:  Head: Normocephalic.  Right Ear: External ear normal.  Left Ear: External ear normal.  Eyes: Pupils are equal, round, and reactive to light.  Neck: No tracheal deviation present. No thyromegaly present.  Cardiovascular: Normal rate.   Pulmonary/Chest: Effort normal.  Abdominal: Soft.  Musculoskeletal:  Patient slow getting from sitting to standing she walks with hip flexed position. Short stride gait. Anterior tib EHL is intact on both sides has pain with straight leg raising on the right 70. Some buttocks and sciatic  pain with hip range of motion but no limitation of internal rotation right or left hip. Knees reach full extension no knee effusion. EHL is intact gastrocsoleus is intact no rash over exposed skin. No calf atrophy. No palpable dorsalis pedis right and left. 1+ posterior tib on the right 2+ on the left. Doppler exam demonstrates 1+ peroneal artery anterolateral ankle right and left. No plantar foot lesions the toe so the slow capillary refill and this is symmetrical.  Neurological: She is alert and oriented to person, place, and time.  Skin: Skin is warm and dry.  Psychiatric: She has a normal mood and affect. Her behavior is normal.    Ortho Exam  Specialty Comments:  No specialty comments available.  Imaging: Xr Cervical Spine 2 Or 3 Views  Result Date: 07/28/2016 AP and lateral C-spine x-rays are obtained. This shows spondylosis with narrowing of C4-5 endplate spurring and significant narrowing at C5-6 and also C6-7 Impression: Advanced mid cervical spondylosis. No significant spondylolisthesis.  Study Result   CLINICAL DATA:  Low back pain first over years. New onset leg weakness. Prior back surgery in 2010.  EXAM: MRI LUMBAR SPINE WITHOUT CONTRAST  TECHNIQUE: Multiplanar, multisequence MR imaging of the lumbar spine was performed. No intravenous contrast was administered.  COMPARISON:  04/07/2013 ; 10/19/2009  FINDINGS: The lowest lumbar type non-rib-bearing vertebra is labeled as L5. The conus medullaris appears normal. Conus level: T12-L1.  Dextroconvex lumbar scoliosis noted. There is 3 mm of degenerative retrolisthesis at L2-3 and 3 mm degenerative anterolisthesis at L4-5. Considerable dextroconvex lumbar scoliosis noted, centered at L2- 3, and with significant rotary component.  Multilevel degenerative endplate findings in the lumbar spine, with disc desiccation throughout the lumbar spine and loss of disc height most severe at L1- 2, L2- 3, and L3-4.  There  is a disc bulge at T10-11 with questionable impingement. Additional findings at individual levels are as follows:  L1-2: Moderate bilateral foraminal stenosis and mild central narrowing of the thecal sac due to disc bulge, facet arthropathy, intervertebral spurring, and right foraminal and lateral extraforaminal disc protrusion.  L2-3: Moderate left and mild right foraminal stenosis due to facet arthropathy, disc bulge, and intervertebral spurring.  L3-4: Prominent central narrowing of the thecal sac with moderate bilateral foraminal stenosis and mild left subarticular lateral recess stenosis due to facet arthropathy, ligamentum flavum redundancy, disc bulge, and intervertebral spurring. Findings worsened compared to prior.  L4-5: Severe central narrowing of the thecal sac with moderate bilateral foraminal stenosis and prominent bilateral subarticular lateral recess stenosis due to disc bulge, disc uncovering, and facet arthropathy. Findings worsened compared to prior.  L5-S1: Moderate to severe right and mild left foraminal stenosis with mild right subarticular lateral recess stenosis due to disc bulge, intervertebral spurring, and facet arthropathy, worsened compared to prior. There is also a right foraminal disc protrusion.  IMPRESSION: 1. Worsened lumbar spondylosis and  degenerative disc disease, c she had moderate disease with foraminal narrowing at 53. Nodes of disc recurrence at the L3-4 level where she had surgery on the right with HNP. ausing prominent impingement at L3-4 and L4-5; moderate to prominent impingement at L5-S1; and moderate impingement at L1- 2 and L2- 3, as detailed above. 2. Dextroconvex lumbar scoliosis.   Electronically Signed   By: Van Clines M.D.   On: 01/02/2015 12:01      PMFS History: Patient Active Problem List   Diagnosis Date Noted  . Leg cramps 06/20/2016  . Vitamin D deficiency 11/28/2015  . Left hip pain 11/28/2015    . Osteoporosis 09/16/2015  . Pain in joint, shoulder region 09/16/2015  . Estrogen deficiency 03/14/2015  . Spinal stenosis of lumbar region 01/24/2015  . Myalgia 01/11/2015  . Preventative health care 12/11/2014  . Essential hypertension 12/11/2014  . Pulmonary hypertension ([place])   . Chronic cough   . Chronic diastolic heart failure ([place]) 05/15/2013  . Diabetes ([place]) 05/14/2013  . Other vitamin B12 deficiency anemia 07/13/2012  . Restless legs syndrome (RLS) 07/13/2012   Past Medical History:  Diagnosis Date  . Abnormality of gait 11/22/2014  . Arthritis   . Cyst of breast, right, benign solitary   . Dyslipidemia   . H/O: hysterectomy   . HHNC (hyperglycemic hyperosmolar nonketotic coma) ([place]) 05/14/2013  . History of appendectomy   . History of lumbosacral spine surgery   . Hypertension   . Inguinal hernia   . Restless leg syndrome     Family History  Problem Relation Age of Onset  . Heart attack Father   . Lung cancer Brother   . Healthy Sister     Past Surgical History:  Procedure Laterality Date  . ABDOMINAL HYSTERECTOMY    . APPENDECTOMY    . BOWEL RESECTION N/A 03/23/2013   Procedure: SMALL BOWEL RESECTION;  Surgeon: Gwenyth Ober, MD;  Location: [facility];  Service: General;  Laterality: N/A;  . ESOPHAGOGASTRODUODENOSCOPY N/A 04/01/2013   Procedure: ESOPHAGOGASTRODUODENOSCOPY (EGD);  Surgeon: Gwenyth Ober, MD;  Location: [facility] ENDOSCOPY;  Service: General;  Laterality: N/A;  . LAPAROTOMY N/A 03/23/2013   Procedure: EXPLORATORY LAPAROTOMY;  Surgeon: Gwenyth Ober, MD;  Location: [facility];  Service: General;  Laterality: N/A;  . PEG PLACEMENT N/A 04/01/2013   Procedure: PERCUTANEOUS ENDOSCOPIC GASTROSTOMY (PEG) PLACEMENT;  Surgeon: Gwenyth Ober, MD;  Location: [facility];  Service: General;  Laterality: N/A;  . TRACHEOSTOMY     feinstein   Social History   Occupational History  . retired    Social History Main Topics  . Smoking status: Former Smoker    Packs/day:  0.00    Types: Cigarettes    Quit date: 02/25/2003  . Smokeless tobacco: Never Used  . Alcohol use No     Comment: socailly.  . Drug use: No  . Sexual activity: Not on file

## 2016-07-28 NOTE — Addendum Note (Signed)
Addended by: Meyer Cory on: 07/28/2016 10:49 AM   Modules accepted: Orders

## 2016-07-30 ENCOUNTER — Encounter: Payer: Medicare Other | Admitting: Internal Medicine

## 2016-07-30 ENCOUNTER — Ambulatory Visit (INDEPENDENT_AMBULATORY_CARE_PROVIDER_SITE_OTHER): Payer: Medicare Other | Admitting: Orthopaedic Surgery

## 2016-08-01 ENCOUNTER — Ambulatory Visit: Payer: Medicare Other

## 2016-08-04 ENCOUNTER — Ambulatory Visit (INDEPENDENT_AMBULATORY_CARE_PROVIDER_SITE_OTHER): Payer: Medicare Other | Admitting: Internal Medicine

## 2016-08-04 DIAGNOSIS — E559 Vitamin D deficiency, unspecified: Secondary | ICD-10-CM

## 2016-08-04 DIAGNOSIS — I739 Peripheral vascular disease, unspecified: Secondary | ICD-10-CM | POA: Diagnosis not present

## 2016-08-04 MED ORDER — VITAMIN D (ERGOCALCIFEROL) 1.25 MG (50000 UNIT) PO CAPS: 50000.0000 [IU] | ORAL_CAPSULE | ORAL | 0 refills | Status: DC

## 2016-08-04 MED ORDER — ATORVASTATIN CALCIUM 20 MG PO TABS: 20.0000 mg | ORAL_TABLET | Freq: Every day | ORAL | 3 refills | Status: DC

## 2016-08-04 MED ORDER — PRAVASTATIN SODIUM 40 MG PO TABS: 40.0000 mg | ORAL_TABLET | Freq: Every evening | ORAL | 1 refills | Status: DC

## 2016-08-04 NOTE — Progress Notes (Signed)
   CC: Patient is complaining of cramps in her right leg.  HPI:  [name] is a 83 y.o. female with a past medical history of conditions listed below presenting to the clinic complaining of cramps in her right leg. Vitamin D deficiency was also discussed during this visit. Please see problem based charting for the status of the patient's current and chronic medical conditions.   Past Medical History:  Diagnosis Date  . Abnormality of gait 11/22/2014  . Arthritis   . Cyst of breast, right, benign solitary   . Dyslipidemia   . H/O: hysterectomy   . HHNC (hyperglycemic hyperosmolar nonketotic coma) ([place]) 05/14/2013  . History of appendectomy   . History of lumbosacral spine surgery   . Hypertension   . Inguinal hernia   . Restless leg syndrome     Review of Systems: Please see problem based charting for the status of the patient's current and chronic medical conditions.   Physical Exam:  Vitals:   08/04/16 1044  BP: (!) 144/58  Pulse: 69  Temp: 98.2 F (36.8 C)  TempSrc: Oral  SpO2: 100%  Weight: 139 lb 4.8 oz (63.2 kg)  Height: 5\' 2"  (1.575 m)   Physical Exam  Constitutional: She is oriented to person, place, and time. She appears well-developed and well-nourished. No distress.  HENT:  Head: Normocephalic and atraumatic.  Eyes: Right eye exhibits no discharge. Left eye exhibits no discharge.  Cardiovascular: Normal rate, regular rhythm and intact distal pulses.   Left dorsalis pedis pulse +1 on palpation. Right dorsalis pedis pulse detected on Doppler but not on palpation.  Pulmonary/Chest: Effort normal and breath sounds normal. No respiratory distress. She has no wheezes. She has no rales.  Abdominal: Soft. Bowel sounds are normal. She exhibits no distension. There is no tenderness.  Musculoskeletal: She exhibits no edema.  Neurological: She is alert and oriented to person, place, and time.  Skin: Skin is warm and dry.    Assessment & Plan:   See  Encounters Tab for problem based charting.  Patient discussed with Dr. Lynnae January

## 2016-08-04 NOTE — Patient Instructions (Addendum)
[name] it was a pleasure meeting you today.  You have been referred to vascular surgery.  STOP taking Mirapex   Continue taking Aspirin 81 mg once daily.  Start taking Lipitor 20 mg once daily.  Dose of vitamin D has been increased: take 50,000 units 1 capsule every 7 days.   Return for a follow-up visit in 8 weeks.

## 2016-08-04 NOTE — Assessment & Plan Note (Addendum)
Assessment Patient has been on a supplement since January 2018. Vitamin D level continued to be low during labs done in April 2018.  Plan -Increase dose of vitamin D from 1000 units daily to 50,000 units weekly. Recheck vitamin D level in 8 weeks.

## 2016-08-04 NOTE — Addendum Note (Signed)
Addended by: Shela Leff on: 08/04/2016 02:42 PM   Modules accepted: Orders

## 2016-08-04 NOTE — Assessment & Plan Note (Addendum)
History of present illness Patient reports having cramps in her right lower extremity since April 2018. Reports having cramps in the buttock region, hamstring, and calf area. States the cramps can occur at any time but are usually worse during the day. She usually experiences 3-4 episodes per day. Cramps are present at rest and not necessarily worse with walking. Does not endorse any paresthesias in her lower extremities. She is currently taking a vitamin D supplement, vitamin B12 supplement, and Mirapex. States eating mustard helps somewhat but she continues to have cramps. Patient is a former smoker.  Assessment Peripheral arterial disease of the right lower extremity. Dorsalis pedis pulse was not palpable on exam and only detected on Doppler. ABI was done at this visit - normal (1.1) in the left lower extremity and PAD detected in the right lower extremity. She does have risk factors for peripheral arterial disease including hypertension and a history of smoking in the past. Patient does have vitamin D deficiency and has been on a supplement since January 2018. Vitamin D level continued to be low during labs done in April 2018. Other labs including potassium, calcium, albumin, and magnesium were normal in April. Phosphorus has been normal on prior labs, including January 2017. Ferritin was low at 24 in August 2017 but recently checked Hgb was normal. Restless leg syndrome less likely to be causing her symptoms as her cramps do not seem to be nocturnal and ABI today showing PAD in the RLE.   Plan -Referral to vascular surgery -Discontinue Mirapex -Risk factor modification:  Continue aspirin 81 mg daily Start Lipitor 20 mg daily. Patient has been made aware of the risk of myalgias and advised to discontinue the medication immediately and notify the clinic in case she experiences worsening cramps. Continue management for hypertension. Educated patient about healthy eating and exercise.  Patient is not  a diabetic and has good glycemic control. Glucose was 96 on CMP in April. She is not smoking anymore. -Increase dose of vitamin D from 1000 units daily to 50,000 units weekly. Recheck vitamin D level in 8 weeks. -The lab had closed for lunch and patient had to leave. Consider checking ferritin level at her next visit.  Addendum 08/04/2016 at 2:37 pm: Atorvastatin is lipophilic. Spoke the patient's pharmacy and asked them to cancel it as she has not yet picked it up. Switched her to a moderate intensity hydrophilic statin (Pravastatin 40 mg daily) to reduce the risk of developing myopathy. Left message asking the patient to call the clinic back.   Addendum 08/04/2016 at 3:14 pm: Patient called back. She agrees with the plan.

## 2016-08-05 NOTE — Progress Notes (Signed)
Internal Medicine Clinic Attending  I saw and evaluated the patient.  I personally confirmed the key portions of the history and exam documented by Dr. Marlowe Sax and I reviewed pertinent patient test results.  The assessment, diagnosis, and plan were formulated together and I agree with the documentation in the resident's note. I was presented and partciapted in the ABI measurement with Dr Marlowe Sax.

## 2016-08-06 ENCOUNTER — Ambulatory Visit
Admission: RE | Admit: 2016-08-06 | Discharge: 2016-08-06 | Disposition: A | Discharge status: Home / Self Care | Payer: Medicare Other | Source: Ambulatory Visit | Attending: Orthopaedic Surgery | Admitting: Orthopaedic Surgery

## 2016-08-06 DIAGNOSIS — G8929 Other chronic pain: Secondary | ICD-10-CM

## 2016-08-06 DIAGNOSIS — M545 Low back pain: Principal | ICD-10-CM

## 2016-08-08 ENCOUNTER — Ambulatory Visit (HOSPITAL_COMMUNITY)
Admission: RE | Admit: 2016-08-08 | Discharge: 2016-08-08 | Disposition: A | Discharge status: Home / Self Care | Payer: Medicare Other | Source: Ambulatory Visit | Attending: Vascular Surgery | Admitting: Vascular Surgery

## 2016-08-08 ENCOUNTER — Encounter: Payer: Self-pay | Admitting: Vascular Surgery

## 2016-08-08 ENCOUNTER — Ambulatory Visit (INDEPENDENT_AMBULATORY_CARE_PROVIDER_SITE_OTHER): Payer: Medicare Other | Admitting: Vascular Surgery

## 2016-08-08 VITALS — BP 137/78 | HR 91 | Temp 98.7°F | Resp 16 | Ht 62.0 in | Wt 138.0 lb

## 2016-08-08 DIAGNOSIS — I70213 Atherosclerosis of native arteries of extremities with intermittent claudication, bilateral legs: Secondary | ICD-10-CM | POA: Diagnosis not present

## 2016-08-08 DIAGNOSIS — M48062 Spinal stenosis, lumbar region with neurogenic claudication: Secondary | ICD-10-CM | POA: Diagnosis not present

## 2016-08-08 DIAGNOSIS — I771 Stricture of artery: Secondary | ICD-10-CM | POA: Diagnosis not present

## 2016-08-08 NOTE — Progress Notes (Signed)
Requested by:  Maryellen Pile, MD [address] [place], [place] [id number]  Reason for consultation: abnormal ABI    History of Present Illness   Kristi Alexander is a 83 y.o. (1925-12-31) female who presents with cc: back pain and leg pain.  Pt has previously had spine surgery in 2011 with Dr. Lorin Mercy.  Currently the patient notes R>L leg pain at rest and with walking.  Pt is described as cramping in character, mild-mod in intensity, some radiating character.  Pt intermittently has some radicular sx.  Her ambulation is extremely limited reportedly, requiring a cane when she is moving.  Past Medical History:  Diagnosis Date  . Abnormality of gait 11/22/2014  . Arthritis   . Cyst of breast, right, benign solitary   . Dyslipidemia   . H/O: hysterectomy   . HHNC (hyperglycemic hyperosmolar nonketotic coma) ([place]) 05/14/2013  . History of appendectomy   . History of lumbosacral spine surgery   . Hypertension   . Inguinal hernia   . Restless leg syndrome     Past Surgical History:  Procedure Laterality Date  . ABDOMINAL HYSTERECTOMY    . APPENDECTOMY    . BOWEL RESECTION N/A 03/23/2013   Procedure: SMALL BOWEL RESECTION;  Surgeon: Gwenyth Ober, MD;  Location: [facility];  Service: General;  Laterality: N/A;  . ESOPHAGOGASTRODUODENOSCOPY N/A 04/01/2013   Procedure: ESOPHAGOGASTRODUODENOSCOPY (EGD);  Surgeon: Gwenyth Ober, MD;  Location: [facility] ENDOSCOPY;  Service: General;  Laterality: N/A;  . LAPAROTOMY N/A 03/23/2013   Procedure: EXPLORATORY LAPAROTOMY;  Surgeon: Gwenyth Ober, MD;  Location: [facility];  Service: General;  Laterality: N/A;  . PEG PLACEMENT N/A 04/01/2013   Procedure: PERCUTANEOUS ENDOSCOPIC GASTROSTOMY (PEG) PLACEMENT;  Surgeon: Gwenyth Ober, MD;  Location: [facility];  Service: General;  Laterality: N/A;  . TRACHEOSTOMY     feinstein     Social History   Social History  . Marital status: Married    Spouse name: Kristi Alexander   . Number of children: 2  . Years of education:  12+   Occupational History  . retired    Social History Main Topics  . Smoking status: Former Smoker    Packs/day: 0.00    Types: Cigarettes    Quit date: 02/25/2003  . Smokeless tobacco: Never Used  . Alcohol use No     Comment: socailly.  . Drug use: No  . Sexual activity: Not on file   Other Topics Concern  . Not on file   Social History Narrative   Patient is married and lives with her husband. The patient is retired. Patient has 2 children and has a college education.    Patient is right handed.   Patient drinks very little caffeine.    Family History  Problem Relation Age of Onset  . Heart attack Father   . Lung cancer Brother   . Healthy Sister     Current Outpatient Prescriptions  Medication Sig Dispense Refill  . amLODipine (NORVASC) 10 MG tablet TAKE ONE TABLET BY MOUTH ONCE DAILY 30 tablet 3  . aspirin 81 MG tablet 81 mg by Gastric Tube route daily.     . ferrous sulfate 325 (65 FE) MG tablet Take 325 mg by mouth 2 (two) times daily with a meal.    . hydrOXYzine (ATARAX/VISTARIL) 25 MG tablet TAKE ONE TABLET BY MOUTH ONCE DAILY AS NEEDED 90 tablet 2  . isosorbide mononitrate (IMDUR) 30 MG 24 hr tablet TAKE ONE AND ONE-HALF  TABLETS BY MOUTH ONCE DAILY 135 tablet 3  . losartan (COZAAR) 100 MG tablet Take 1 tablet (100 mg total) by mouth daily. 90 tablet 11  . metoprolol succinate (TOPROL-XL) 25 MG 24 hr tablet TAKE ONE TABLET BY MOUTH ONCE DAILY 90 tablet 3  . pravastatin (PRAVACHOL) 40 MG tablet Take 1 tablet (40 mg total) by mouth every evening. 90 tablet 1  . vitamin B-12 (CYANOCOBALAMIN) 1000 MCG tablet Take 1 tablet (1,000 mcg total) by mouth daily.    . Vitamin D, Ergocalciferol, (DRISDOL) 50000 units CAPS capsule Take 1 capsule (50,000 Units total) by mouth every 7 (seven) days. 12 capsule 0  . celecoxib (CELEBREX) 100 MG capsule Take 1 cap po up to q 12h prn leg pain. Take with food. (Patient not taking: Reported on 07/28/2016) 20 capsule 0   No current  facility-administered medications for this visit.     Allergies  Allergen Reactions  . Penicillins Swelling    REVIEW OF SYSTEMS (negative unless checked):   Cardiac:  []  Chest pain or chest pressure? []  Shortness of breath upon activity? []  Shortness of breath when lying flat? []  Irregular heart rhythm?  Vascular:  [x]  Pain in calf, thigh, or hip brought on by walking? []  Pain in feet at night that wakes you up from your sleep? []  Blood clot in your veins? []  Leg swelling?  Pulmonary:  []  Oxygen at home? []  Productive cough? []  Wheezing?  Neurologic:  [x]  Sudden weakness in arms or legs? [x]  Sudden numbness in arms or legs? []  Sudden onset of difficult speaking or slurred speech? []  Temporary loss of vision in one eye? []  Problems with dizziness?  Gastrointestinal:  []  Blood in stool? []  Vomited blood?  Genitourinary:  []  Burning when urinating? []  Blood in urine?  Psychiatric:  []  Major depression  Hematologic:  []  Bleeding problems? []  Problems with blood clotting?  Dermatologic:  []  Rashes or ulcers?  Constitutional:  []  Fever or chills?  Ear/Nose/Throat:  []  Change in hearing? []  Nose bleeds? []  Sore throat?  Musculoskeletal:  [x]  Back pain? [x]  Joint pain? [x]  Muscle pain?   For VQI Use Only   PRE-ADM LIVING Home  AMB STATUS Ambulatory with Assistance  CAD Sx None  PRIOR CHF None  STRESS TEST No    Physical Examination     Vitals:   08/08/16 1217 08/08/16 1219  BP: (!) 144/78 137/78  Pulse: 88 91  Resp: 16   Temp: 98.7 F (37.1 C)   SpO2: 97%   Weight: 138 lb (62.6 kg)   Height: 5\' 2"  (1.575 m)    Body mass index is 25.24 kg/m.  General Alert, O x 3, WD, Elderly  Head [place]/AT,    Ear/Nose/ Throat Hearing grossly intact, nares without erythema or drainage, oropharynx with Erythema without Exudate, Mallampati score: 3,   Eyes PERRLA, EOMI,    Neck Supple, mid-line trachea, small palpable L neck LN  Pulmonary Sym exp,  good B air movt, CTA B  Cardiac RRR, Nl S1, S2, no Murmurs, No rubs, No S3,S4  Vascular Vessel Right Left  Radial Palpable Palpable  Brachial Palpable Palpable  Carotid Palpable, No Bruit Palpable, No Bruit  Aorta Not palpable N/A  Femoral Palpable Palpable  Popliteal Not palpable Not palpable  PT Not palpable Not palpable  DP Not palpable Not palpable    Gastro- intestinal soft, non-distended, non-tender to palpation, No guarding or rebound, no HSM, no masses, no CVAT B, No palpable prominent aortic pulse, Surgical  incisions well healed  Musculo- skeletal M/S 5/5 throughout  , Extremities without ischemic changes  , No edema present, No visible varicosities , No Lipodermatosclerosis present, R leg/back sx with straight leg raise, +back pain with position change  Neurologic Cranial nerves 2-12 intact , Pain and light touch intact in extremities , Motor exam as listed above  Psychiatric Judgement intact, Mood & affect appropriate for pt's clinical situation  Dermatologic See M/S exam for extremity exam, redundant skin throughout  Lymphatic  Palpable lymph nodes: Cervical    Non-invasive Vascular Imaging     ABI (08/08/2016)  R:   ABI: 0.59,   PT: tri  DP: bi  TBI:  0.23  L:   ABI: 0.93,   PT: tri  DP: tri  TBI: 0.49   Radiology   Mr Lumbar Spine W/o Contrast  Result Date: 08/06/2016 CLINICAL DATA:  Progressive low back pain with left leg cramping. Evaluate for progressive L4-5 stenosis. History of epidural injections and remote back surgery (2007). EXAM: MRI LUMBAR SPINE WITHOUT CONTRAST TECHNIQUE: Multiplanar, multisequence MR imaging of the lumbar spine was performed. No intravenous contrast was administered. COMPARISON:  Lumbar spine radiographs 03/25/2016. Lumbar MRI 01/02/2015. FINDINGS: Segmentation: Conventional anatomy assumed, with the last open disc space designated L5-S1. Alignment: Mild convex right scoliosis. A slight anterolisthesis has developed at  L4-5. Vertebrae: No worrisome osseous lesion, acute fracture or pars defect. There is progressive multilevel endplate degeneration, especially at L4-5. The lumbar pedicles are diffusely short on a congenital basis. The visualized sacroiliac joints appear unremarkable. Conus medullaris: Extends to the T12-L1 level and appears normal. Paraspinal and other soft tissues: No significant paraspinal findings. Disc levels: T10-11: Stable loss of intervertebral disc height with annular bulging. No cord deformity or foraminal compromise. No significant disc space findings at T11-12 or T12-L1. L1-2: Chronic degenerative disc disease with loss of disc height, annular disc bulging and endplate osteophytes, mildly asymmetric to the right. Mild facet and ligamentous hypertrophy. Stable right-greater-than-left foraminal narrowing. L2-3: Stable chronic spondylosis with annular disc bulging and endplate osteophytes. Stable moderate left and mild right foraminal narrowing. L3-4: Suspected remote postsurgical changes on the right with partial resection of the ligamentum flavum. There is chronic degenerative disc disease with annular disc bulging and endplate osteophytes. Facet hypertrophy appears stable. The resulting mild spinal stenosis and mild narrowing of the lateral recesses and foramina appear unchanged. L4-5: Interval significant progression in loss of intervertebral disc height with development of a broad-based disc protrusion on the left, extending into the left foramen. Associated progressive endplate degenerative changes. There is advanced facet and ligamentous hypertrophy which also appears progressive. These findings contribute to severe spinal stenosis and progressive left-greater-than-right foraminal narrowing. Left L4 nerve root encroachment likely. L5-S1: Chronic spondylosis with annular disc bulging and endplate osteophytes asymmetric to the right. There is also asymmetric right-sided facet hypertrophy. These factors  contribute to moderate to severe right and mild left foraminal narrowing, grossly stable. IMPRESSION: 1. Compared with previous MRI from 2016, there is significantly progressive disc and endplate degeneration at L4-5 with development of a broad-based left-sided disc protrusion. There is resulting severe multifactorial spinal stenosis and progressive left-greater-than-right foraminal narrowing. 2. The findings at the other levels are not significantly changed, detailed above. Electronically Signed   By: Richardean Sale M.D.   On: 08/06/2016 15:36    Outside Studies/Documentation   2 pages of outside documents were reviewed including: outside limited ABI.   Medical Decision Making   DAFNE NIELD is a 83  y.o. female who presents with: BLE PAD, spinal stenosis with HNP   Pt's sx are atypical for PAD.  I suspect her back issues limit her from truly developing intermittent claudication due to limitation in mobility.  Without oxygen consumption with movement, it is difficult develop vasculogenic claudication, especially since the patient's ABI are not consistent with critical limb ischemia (R 0.59, L 0.93).  Her ABI suggest she has proximal inflow disease on the R side, but I suspect her arterial disease is not the primary etiology for her pain.  I recommend: follow up with Dr. Lorin Mercy for back evaluation (27 JUN 18) given the interval change on her L-spine MRI.  If no spine intervention is needed, then consider proceeding with an aortogram, bilateral leg runoff, and possible right iliac intervention.  Otherwise, would plan on repeating ABI in 3 months with additional aortoiliac to evaluate the iliac arterial system.  This patient's BLE ABI would put her into the intermittent claudication range normal, so there usually minimal risk of tissue loss.  We discussed the signs and symptoms of critical limb ischemia.  She will follow up more rapidly if she develops such.  I discussed in depth with  the patient the nature of atherosclerosis, and emphasized the importance of maximal medical management including strict control of blood pressure, blood glucose, and lipid levels, obtaining regular exercise, antiplatelet agents, and cessation of smoking.   The patient is currently on a statin: Pravachol.  The patient is currently on an anti-platelet: ASA.  The patient is aware that without maximal medical management the underlying atherosclerotic disease process will progress, limiting the benefit of any interventions.  Thank you for allowing [place] to participate in this patient's care.   Adele Barthel, MD, FACS [facility] Office: [phone] Pager: [phone]  08/08/2016, 12:53 PM

## 2016-08-13 ENCOUNTER — Telehealth (INDEPENDENT_AMBULATORY_CARE_PROVIDER_SITE_OTHER): Payer: Self-pay | Admitting: Radiology

## 2016-08-13 NOTE — Telephone Encounter (Signed)
Patient is calling triage today wondering what else can be done for her. She is seeing Dr. Lorin Mercy. She has had vascular studies last week per Dr. Lorin Mercy request and has had MRI done. She is taking two Aleve, she is having severe pain and can hardly walk due to pain in hip and leg. She is currently using [facility] [place]. Please call back and advise. [phone].

## 2016-08-13 NOTE — Telephone Encounter (Signed)
Please advise 

## 2016-08-14 MED ORDER — PREDNISONE 5 MG (21) PO TBPK: ORAL_TABLET | ORAL | 0 refills | Status: DC

## 2016-08-14 NOTE — Telephone Encounter (Signed)
Sent to pharmacy. I left voicemail for patient advising medication sent to pharmacy requested and advised to keep her appt on 08/20/2016 at which time they will discuss options, surgery, etc.

## 2016-08-14 NOTE — Telephone Encounter (Signed)
Needs ROV to discuss lumbar surgery.  Send in prednisone 5mg  dose pack # 21      Take 6,5,4,3,2,1 then stop. ucall thanks

## 2016-08-14 NOTE — Addendum Note (Signed)
Addended by: Meyer Cory on: 08/14/2016 01:56 PM   Modules accepted: Orders

## 2016-08-15 NOTE — Addendum Note (Signed)
Addended by: Lianne Cure A on: 08/15/2016 02:46 PM   Modules accepted: Orders

## 2016-08-20 ENCOUNTER — Ambulatory Visit (INDEPENDENT_AMBULATORY_CARE_PROVIDER_SITE_OTHER): Payer: Medicare Other | Admitting: Orthopaedic Surgery

## 2016-08-20 ENCOUNTER — Encounter (INDEPENDENT_AMBULATORY_CARE_PROVIDER_SITE_OTHER): Payer: Self-pay | Admitting: Orthopaedic Surgery

## 2016-08-20 VITALS — Ht 62.0 in | Wt 140.0 lb

## 2016-08-20 DIAGNOSIS — M48062 Spinal stenosis, lumbar region with neurogenic claudication: Secondary | ICD-10-CM

## 2016-08-20 NOTE — Addendum Note (Signed)
Addended by: Meyer Cory on: 08/20/2016 12:06 PM   Modules accepted: Orders

## 2016-08-20 NOTE — Progress Notes (Signed)
Office Visit Note   Patient: Kristi Alexander           Date of Birth: 01/19/1926           MRN: [id number] Visit Date: 08/20/2016              Requested by: Maryellen Pile, MD [address] [place], [place] [id number] PCP: Maryellen Pile, MD   Assessment & Plan: Visit Diagnoses:  1. Spinal stenosis of lumbar region with neurogenic claudication     Plan: We'll set her up for lumbar epidural injection. She has spinal stenosis multifactorial combined congenital and acquired at L4-5.  Follow-Up Instructions: Follow-up 6 weeks.  Orders:  No orders of the defined types were placed in this encounter.  No orders of the defined types were placed in this encounter.     Procedures: No procedures performed   Clinical Data: No additional findings.   Subjective: Chief Complaint  Patient presents with  . Lower Back - Follow-up    HPI 83 year old female returns with ongoing back pain and leg weakness and claudication symptoms. ABIs showed 0.6 on the right and 0.93 on the left. She's had an MRI was available for review as well which show satisfactory decompression at L3-4 which had previous surgery back in 2011. She's had progression of the stenosis at L4-5 with a combination of congenital stenosis and acquired stenosis resulting in severe spinal stenosis and left greater than right foraminal narrowing.  Review of Systems 14 point review of systems updated unchanged from 07/29/2006 office visit other than as mentioned above.   Objective: Vital Signs: Ht 5\' 2"  (1.575 m)   Wt 140 lb (63.5 kg)   BMI 25.61 kg/m   Physical Exam  Constitutional: She is oriented to person, place, and time. She appears well-developed.  HENT:  Head: Normocephalic.  Right Ear: External ear normal.  Left Ear: External ear normal.  Eyes: Pupils are equal, round, and reactive to light.  Neck: No tracheal deviation present. No thyromegaly present.  Cardiovascular: Normal rate.   Pulmonary/Chest:  Effort normal.  Abdominal: Soft.  Neurological: She is alert and oriented to person, place, and time.  Skin: Skin is warm and dry.  Psychiatric: She has a normal mood and affect. Her behavior is normal.    Ortho Exam patient getting per sitting standing she is a mature with a 4 pronged cane. She does have a walker at home but tries to avoid using it. Atrophy. She has no palpable dorsalis pedis pulses. She has some sciatic notch tenderness and pain with straight leg raising at 70 on the right. Pain with hip range of motion.  Specialty Comments:  No specialty comments available.  Imaging: Study Result   CLINICAL DATA:  Progressive low back pain with left leg cramping. Evaluate for progressive L4-5 stenosis. History of epidural injections and remote back surgery (2007).  EXAM: MRI LUMBAR SPINE WITHOUT CONTRAST  TECHNIQUE: Multiplanar, multisequence MR imaging of the lumbar spine was performed. No intravenous contrast was administered.  COMPARISON:  Lumbar spine radiographs 03/25/2016. Lumbar MRI 01/02/2015.  FINDINGS: Segmentation: Conventional anatomy assumed, with the last open disc space designated L5-S1.  Alignment: Mild convex right scoliosis. A slight anterolisthesis has developed at L4-5.  Vertebrae: No worrisome osseous lesion, acute fracture or pars defect. There is progressive multilevel endplate degeneration, especially at L4-5. The lumbar pedicles are diffusely short on a congenital basis. The visualized sacroiliac joints appear unremarkable.  Conus medullaris: Extends to the T12-L1 level and appears  normal.  Paraspinal and other soft tissues: No significant paraspinal findings.  Disc levels:  T10-11: Stable loss of intervertebral disc height with annular bulging. No cord deformity or foraminal compromise.  No significant disc space findings at T11-12 or T12-L1.  L1-2: Chronic degenerative disc disease with loss of disc height, annular disc  bulging and endplate osteophytes, mildly asymmetric to the right. Mild facet and ligamentous hypertrophy. Stable right-greater-than-left foraminal narrowing.  L2-3: Stable chronic spondylosis with annular disc bulging and endplate osteophytes. Stable moderate left and mild right foraminal narrowing.  L3-4: Suspected remote postsurgical changes on the right with partial resection of the ligamentum flavum. There is chronic degenerative disc disease with annular disc bulging and endplate osteophytes. Facet hypertrophy appears stable. The resulting mild spinal stenosis and mild narrowing of the lateral recesses and foramina appear unchanged.  L4-5: Interval significant progression in loss of intervertebral disc height with development of a broad-based disc protrusion on the left, extending into the left foramen. Associated progressive endplate degenerative changes. There is advanced facet and ligamentous hypertrophy which also appears progressive. These findings contribute to severe spinal stenosis and progressive left-greater-than-right foraminal narrowing. Left L4 nerve root encroachment likely.  L5-S1: Chronic spondylosis with annular disc bulging and endplate osteophytes asymmetric to the right. There is also asymmetric right-sided facet hypertrophy. These factors contribute to moderate to severe right and mild left foraminal narrowing, grossly stable.  IMPRESSION: 1. Compared with previous MRI from 2016, there is significantly progressive disc and endplate degeneration at L4-5 with development of a broad-based left-sided disc protrusion. There is resulting severe multifactorial spinal stenosis and progressive left-greater-than-right foraminal narrowing. 2. The findings at the other levels are not significantly changed, detailed above.   Electronically Signed   By: Richardean Sale M.D.   On: 08/06/2016 15:36       PMFS History: Patient Active Problem List    Diagnosis Date Noted  . Atherosclerosis of native artery of both lower extremities with intermittent claudication ([place]) 08/08/2016  . Peripheral arterial disease ([place]) 08/04/2016  . Leg cramps 06/20/2016  . Vitamin D deficiency 11/28/2015  . Left hip pain 11/28/2015  . Osteoporosis 09/16/2015  . Pain in joint, shoulder region 09/16/2015  . Estrogen deficiency 03/14/2015  . Spinal stenosis of lumbar region 01/24/2015  . Myalgia 01/11/2015  . Preventative health care 12/11/2014  . Essential hypertension 12/11/2014  . Pulmonary hypertension ([place])   . Chronic cough   . Chronic diastolic heart failure ([place]) 05/15/2013  . Diabetes ([place]) 05/14/2013  . Other vitamin B12 deficiency anemia 07/13/2012  . Restless legs syndrome (RLS) 07/13/2012   Past Medical History:  Diagnosis Date  . Abnormality of gait 11/22/2014  . Arthritis   . Cyst of breast, right, benign solitary   . Dyslipidemia   . H/O: hysterectomy   . HHNC (hyperglycemic hyperosmolar nonketotic coma) ([place]) 05/14/2013  . History of appendectomy   . History of lumbosacral spine surgery   . Hypertension   . Inguinal hernia   . Restless leg syndrome     Family History  Problem Relation Age of Onset  . Heart attack Father   . Lung cancer Brother   . Healthy Sister     Past Surgical History:  Procedure Laterality Date  . ABDOMINAL HYSTERECTOMY    . APPENDECTOMY    . BOWEL RESECTION N/A 03/23/2013   Procedure: SMALL BOWEL RESECTION;  Surgeon: Gwenyth Ober, MD;  Location: [facility];  Service: General;  Laterality: N/A;  . ESOPHAGOGASTRODUODENOSCOPY N/A 04/01/2013  Procedure: ESOPHAGOGASTRODUODENOSCOPY (EGD);  Surgeon: Gwenyth Ober, MD;  Location: [facility] ENDOSCOPY;  Service: General;  Laterality: N/A;  . LAPAROTOMY N/A 03/23/2013   Procedure: EXPLORATORY LAPAROTOMY;  Surgeon: Gwenyth Ober, MD;  Location: [facility];  Service: General;  Laterality: N/A;  . PEG PLACEMENT N/A 04/01/2013   Procedure: PERCUTANEOUS ENDOSCOPIC GASTROSTOMY (PEG)  PLACEMENT;  Surgeon: Gwenyth Ober, MD;  Location: [facility];  Service: General;  Laterality: N/A;  . TRACHEOSTOMY     feinstein   Social History   Occupational History  . retired    Social History Main Topics  . Smoking status: Former Smoker    Packs/day: 0.00    Types: Cigarettes    Quit date: 02/25/2003  . Smokeless tobacco: Never Used  . Alcohol use No     Comment: socailly.  . Drug use: No  . Sexual activity: Not on file

## 2016-08-29 ENCOUNTER — Telehealth (INDEPENDENT_AMBULATORY_CARE_PROVIDER_SITE_OTHER): Payer: Self-pay | Admitting: *Deleted

## 2016-08-29 ENCOUNTER — Other Ambulatory Visit (INDEPENDENT_AMBULATORY_CARE_PROVIDER_SITE_OTHER): Payer: Self-pay | Admitting: Radiology

## 2016-08-29 DIAGNOSIS — M48062 Spinal stenosis, lumbar region with neurogenic claudication: Secondary | ICD-10-CM

## 2016-08-29 NOTE — Telephone Encounter (Signed)
IC pt and advised it will be next week, Dr Ernestina Patches has been out this week, and that someone will call her next week.

## 2016-08-29 NOTE — Telephone Encounter (Signed)
Pt called about epidural injection she is supposed to be having

## 2016-09-05 ENCOUNTER — Telehealth (INDEPENDENT_AMBULATORY_CARE_PROVIDER_SITE_OTHER): Payer: Self-pay | Admitting: Radiology

## 2016-09-05 MED ORDER — TRAMADOL HCL 50 MG PO TABS: 50.0000 mg | ORAL_TABLET | Freq: Two times a day (BID) | ORAL | 0 refills | Status: DC | PRN

## 2016-09-05 NOTE — Telephone Encounter (Signed)
Patient left voicemail stating she is scheduled for ESI on 09/10/16 with Dr. Ernestina Patches. She is having difficulty walking and feels that her legs are not supporting her. She is taking Aleve with no relief. She would like to know if there is something else that she can take? Please advise.

## 2016-09-05 NOTE — Telephone Encounter (Signed)
Send in ultram # 30     One po bid prn pain. ucall make sure she is using walker to avoid falling. If ESI does not work then we can do surgery .

## 2016-09-05 NOTE — Addendum Note (Signed)
Addended by: Meyer Cory on: 09/05/2016 12:53 PM   Modules accepted: Orders

## 2016-09-05 NOTE — Telephone Encounter (Signed)
Called to pharmacy. I called patient and advised. 

## 2016-09-10 ENCOUNTER — Ambulatory Visit (INDEPENDENT_AMBULATORY_CARE_PROVIDER_SITE_OTHER): Payer: Medicare Other

## 2016-09-10 ENCOUNTER — Ambulatory Visit (INDEPENDENT_AMBULATORY_CARE_PROVIDER_SITE_OTHER): Payer: Medicare Other | Admitting: Physical Medicine and Rehabilitation

## 2016-09-10 ENCOUNTER — Encounter (INDEPENDENT_AMBULATORY_CARE_PROVIDER_SITE_OTHER): Payer: Self-pay | Admitting: Physical Medicine and Rehabilitation

## 2016-09-10 VITALS — BP 151/69 | HR 65

## 2016-09-10 DIAGNOSIS — M5416 Radiculopathy, lumbar region: Secondary | ICD-10-CM

## 2016-09-10 DIAGNOSIS — M48062 Spinal stenosis, lumbar region with neurogenic claudication: Secondary | ICD-10-CM | POA: Diagnosis not present

## 2016-09-10 MED ORDER — METHYLPREDNISOLONE ACETATE 80 MG/ML IJ SUSP
80.0000 mg | Freq: Once | INTRAMUSCULAR | Status: AC
  Administered 2016-09-10: 80 mg

## 2016-09-10 MED ORDER — LIDOCAINE HCL (PF) 1 % IJ SOLN
2.0000 mL | Freq: Once | INTRAMUSCULAR | Status: AC
  Administered 2016-09-10: 2 mL

## 2016-09-10 MED ORDER — IIOPAMIDOL (ISOVUE-250) INJECTION 51%
3.0000 mL | Freq: Once | INTRAVENOUS | Status: AC
  Administered 2016-09-10: 3 mL
  Filled 2016-09-10: qty 50

## 2016-09-10 NOTE — Progress Notes (Deleted)
Lower back pain into buttocks and down both legs to feet. Comes and goes. Worse and more frequent on right side. Occasional right foot numbness.

## 2016-09-10 NOTE — Patient Instructions (Signed)

## 2016-09-11 ENCOUNTER — Other Ambulatory Visit: Payer: Self-pay | Admitting: Internal Medicine

## 2016-09-11 NOTE — Procedures (Signed)
Kristi Alexander is a very pleasant 83 year old female with low back and bilateral radicular pain consistent with her pre-significant lumbar stenosis at L4-5. Dr. Lorin Mercy requested a diagnostic and therapeutic bilateral L4 transforaminal epidural steroid injection. She has failed conservative care otherwise.  Lumbosacral Transforaminal Epidural Steroid Injection - Infraneural Approach with Fluoroscopic Guidance  Patient: Kristi Alexander      Date of Birth: 1925/03/09 MRN: [id number] PCP: Maryellen Pile, MD      Visit Date: 09/10/2016   Universal Protocol:     Consent Given By: the patient  Position: PRONE   Additional Comments: Vital signs were monitored before and after the procedure. Patient was prepped and draped in the usual sterile fashion. The correct patient, procedure, and site was verified.   Injection Procedure Details:  Procedure Site One Meds Administered:  Meds ordered this encounter  Medications  . lidocaine (PF) (XYLOCAINE) 1 % injection 2 mL  . methylPREDNISolone acetate (DEPO-MEDROL) injection 80 mg  . iopamidol (ISOVUE-250) 51 % injection 3 mL      Laterality: Bilateral  Location/Site:  L4-L5  Needle size: 22 G  Needle type: Spinal  Needle Placement: Transforaminal  Findings:  -Contrast Used: 1 mL iohexol 180 mg iodine/mL   -Comments: Excellent flow of contrast along the nerve and into the epidural space.  Procedure Details: After squaring off the end-plates of the desired vertebral level to get a true AP view, the C-arm was obliqued to the painful side so that the superior articulating process is positioned about 1/3 the length of the inferior endplate.  The needle was aimed toward the junction of the superior articular process and the transverse process of the inferior vertebrae. The needle's initial entry is in the lower third of the foramen through Kambin's triangle. The soft tissues overlying this target were infiltrated with 2-3 ml. of 1% Lidocaine  without Epinephrine.  The spinal needle was then inserted and advanced toward the target using a "trajectory" view along the fluoroscope beam.  Under AP and lateral visualization, the needle was advanced so it did not puncture dura and did not traverse medially beyond the 6 o'clock position of the pedicle. Bi-planar projections were used to confirm position. Aspiration was confirmed to be negative for CSF and/or blood. A 1-2 ml. volume of Isovue-250 was injected and flow of contrast was noted at each level. Radiographs were obtained for documentation purposes.   After attaining the desired flow of contrast documented above, a 0.5 to 1.0 ml test dose of 0.25% Marcaine was injected into each respective transforaminal space.  The patient was observed for 90 seconds post injection.  After no sensory deficits were reported, and normal lower extremity motor function was noted,   the above injectate was administered so that equal amounts of the injectate were placed at each foramen (level) into the transforaminal epidural space.   Additional Comments:  The patient tolerated the procedure well No complications occurred Dressing: Band-Aid    Post-procedure details: Patient was observed during the procedure. Post-procedure instructions were reviewed.  Patient left the clinic in stable condition.

## 2016-09-30 ENCOUNTER — Ambulatory Visit (INDEPENDENT_AMBULATORY_CARE_PROVIDER_SITE_OTHER): Payer: Medicare Other | Admitting: Orthopaedic Surgery

## 2016-09-30 ENCOUNTER — Encounter (INDEPENDENT_AMBULATORY_CARE_PROVIDER_SITE_OTHER): Payer: Self-pay | Admitting: Orthopaedic Surgery

## 2016-09-30 VITALS — BP 123/78 | HR 76

## 2016-09-30 DIAGNOSIS — M48062 Spinal stenosis, lumbar region with neurogenic claudication: Secondary | ICD-10-CM | POA: Diagnosis not present

## 2016-09-30 NOTE — Progress Notes (Signed)
Office Visit Note   Patient: Kristi Alexander           Date of Birth: [birth year]           MRN: [id number] Visit Date: 09/30/2016              Requested by: Maryellen Pile, MD [address] [place], [place] [address] PCP: Maryellen Pile, MD   Assessment & Plan: Visit Diagnoses:  1. Spinal stenosis of lumbar region with neurogenic claudication     Plan: Patient's ability to ambulate his progress we reviewed the MRI scan and discussed with her with severe stenosis. Previous decompression L3-4 2011. She now has severe compression at the L4-5 level. We discussed options and she understands that the epidural injections will not help this problem. She can only stand for 10 minutes. She can make it into the grocery store as long she leans on the cart. She has problems with activities of daily living like to proceed with decompression due to the progressive pain that occurs with standing. Wrist surgery discussed including dural tear potential for progression or other levels. She understands [place] proceed.  Follow-Up Instructions: No Follow-up on file.   Orders:  No orders of the defined types were placed in this encounter.  No orders of the defined types were placed in this encounter.     Procedures: No procedures performed   Clinical Data: No additional findings.   Subjective: Chief Complaint  Patient presents with  . Lower Back - Follow-up    HPI 83 year old female returns she states she had the epidural on 09/10/2016 and got about 3 days relief. She states her right leg feels tight she has some swelling her foot and ankle. She has some numbness in her toes. She feels like her left leg is worse after the injection. She states she has to use a cane and feels like her legs may not support her. MRI in June showed disc degeneration L4-5 with severe multifactorial spinal stenosis left greater than right foraminal narrowing. ABI on the right was 0.6 and on the left 0.93 for lower  extremities. She uses her cane she has a walker but does not like to use it.  Review of Systems 14 part review of systems is updated and is unchanged from last office visit in July.   Objective: Vital Signs: BP 123/78   Pulse 76   Physical Exam  Constitutional: She is oriented to person, place, and time. She appears well-developed.  HENT:  Head: Normocephalic.  Right Ear: External ear normal.  Left Ear: External ear normal.  Eyes: Pupils are equal, round, and reactive to light.  Neck: No tracheal deviation present. No thyromegaly present.  Cardiovascular: Normal rate.   Pulmonary/Chest: Effort normal.  Abdominal: Soft.  Neurological: She is alert and oriented to person, place, and time.  Skin: Skin is warm and dry.  Psychiatric: She has a normal mood and affect. Her behavior is normal.    Ortho Exam patient ambulates with a 4 pronged cane. No lower extremity atrophy. She does have trace posterior tip I cannot palpate dorsalis pedis pulse right left. Some sciatic notch tenderness. Pain with straight leg raising on the right at 70. Some discomfort with hip range of motion but this is mild.  Specialty Comments:  No specialty comments available.  Imaging: No results found.   PMFS History: Patient Active Problem List   Diagnosis Date Noted  . Atherosclerosis of native artery of both lower extremities with intermittent  claudication ([place]) 08/08/2016  . Peripheral arterial disease ([place]) 08/04/2016  . Leg cramps 06/20/2016  . Vitamin D deficiency 11/28/2015  . Left hip pain 11/28/2015  . Osteoporosis 09/16/2015  . Pain in joint, shoulder region 09/16/2015  . Estrogen deficiency 03/14/2015  . Spinal stenosis of lumbar region 01/24/2015  . Myalgia 01/11/2015  . Preventative health care 12/11/2014  . Essential hypertension 12/11/2014  . Pulmonary hypertension ([place])   . Chronic cough   . Chronic diastolic heart failure ([place]) 05/15/2013  . Diabetes ([place]) 05/14/2013  . Other  vitamin B12 deficiency anemia 07/13/2012  . Restless legs syndrome (RLS) 07/13/2012   Past Medical History:  Diagnosis Date  . Abnormality of gait 11/22/2014  . Arthritis   . Cyst of breast, right, benign solitary   . Dyslipidemia   . H/O: hysterectomy   . HHNC (hyperglycemic hyperosmolar nonketotic coma) ([place]) 05/14/2013  . History of appendectomy   . History of lumbosacral spine surgery   . Hypertension   . Inguinal hernia   . Restless leg syndrome     Family History  Problem Relation Age of Onset  . Heart attack Father   . Lung cancer Brother   . Healthy Sister     Past Surgical History:  Procedure Laterality Date  . ABDOMINAL HYSTERECTOMY    . APPENDECTOMY    . BOWEL RESECTION N/A 03/23/2013   Procedure: SMALL BOWEL RESECTION;  Surgeon: Gwenyth Ober, MD;  Location: [facility];  Service: General;  Laterality: N/A;  . ESOPHAGOGASTRODUODENOSCOPY N/A 04/01/2013   Procedure: ESOPHAGOGASTRODUODENOSCOPY (EGD);  Surgeon: Gwenyth Ober, MD;  Location: [facility] ENDOSCOPY;  Service: General;  Laterality: N/A;  . LAPAROTOMY N/A 03/23/2013   Procedure: EXPLORATORY LAPAROTOMY;  Surgeon: Gwenyth Ober, MD;  Location: [facility];  Service: General;  Laterality: N/A;  . PEG PLACEMENT N/A 04/01/2013   Procedure: PERCUTANEOUS ENDOSCOPIC GASTROSTOMY (PEG) PLACEMENT;  Surgeon: Gwenyth Ober, MD;  Location: [facility];  Service: General;  Laterality: N/A;  . TRACHEOSTOMY     feinstein   Social History   Occupational History  . retired    Social History Main Topics  . Smoking status: Former Smoker    Packs/day: 0.00    Types: Cigarettes    Quit date: 02/25/2003  . Smokeless tobacco: Never Used  . Alcohol use No     Comment: socailly.  . Drug use: No  . Sexual activity: Not on file

## 2016-10-10 ENCOUNTER — Telehealth (INDEPENDENT_AMBULATORY_CARE_PROVIDER_SITE_OTHER): Payer: Self-pay

## 2016-10-10 NOTE — Telephone Encounter (Signed)
Patient need to speak with [facility] concerning surgery.

## 2016-10-10 NOTE — Telephone Encounter (Signed)
Please advise 

## 2016-10-10 NOTE — Telephone Encounter (Signed)
Patient called stating that she is having swelling in the right leg.  Would like to know if she can be prescribed something until her surgery is scheduled?  CB# is [phone].  Please advise. Thank You.

## 2016-10-10 NOTE — Telephone Encounter (Signed)
I called. She will elevate feet for her swelling. [name] to call her about scheduling. She said she had left [employer]. I told pt likely waiting to get surgery approved thru insurance.

## 2016-10-23 NOTE — Telephone Encounter (Signed)
I spoke with [name].  She is scheduled for surgery.  All information given to patient and her daughter over the phone.

## 2016-11-04 NOTE — Pre-Procedure Instructions (Signed)
Kristi Alexander  11/04/2016      [place] (SE), [facility] [address]  ([place])  [address] Phone: [phone] Fax: [phone]  [place], [place] [address] Phone: [phone] Fax: [phone]  [place], [place] [address] Phone: [phone] Fax: [phone]    Your procedure is scheduled on Monday, November 10, 2016  Report to [facility] Admitting Entrance "A" at 10:30 A.M.   Call this number if you have problems the morning of surgery:  [phone]   Remember:  Do not eat food or drink liquids after midnight.  Take these medicines the morning of surgery with A SIP OF WATER: AmLODipine (NORVASC), Isosorbide mononitrate (IMDUR), and Metoprolol succinate (TOPROL-XL).  7 days before surgery stop taking all Aspirins, Vitamins, Fish oils, and Herbal medications. Also stop all NSAIDS i.e. Advil, Motrin, Aleve, Anaprox, Naproxen, BC and Goody Powders.   Do not wear jewelry, make-up or nail polish.  Do not wear lotions, powders, or perfumes, or deodorant.  Do not shave 48 hours prior to surgery.   Do not bring valuables to the hospital.  [facility] is not responsible for any belongings or valuables.  Contacts, dentures or bridgework may not be worn into surgery.  Leave your suitcase in the car.  After surgery it may be brought to your room.  For patients admitted to the hospital, discharge time will be determined by your treatment team.  Patients discharged the day of surgery will not be allowed to drive home.   Special instructions:  [place]- Preparing For Surgery  Before surgery, you can play an important role. Because skin is not sterile, your skin needs to be as free of germs as possible. You can reduce the number of  germs on your skin by washing with CHG (chlorahexidine gluconate) Soap before surgery.  CHG is an antiseptic cleaner which kills germs and bonds with the skin to continue killing germs even after washing.  Please do not use if you have an allergy to CHG or antibacterial soaps. If your skin becomes reddened/irritated stop using the CHG.  Do not shave (including legs and underarms) for at least 48 hours prior to first CHG shower. It is OK to shave your face.  Please follow these instructions carefully.   1. Shower the NIGHT BEFORE SURGERY and the MORNING OF SURGERY with CHG.   2. If you chose to wash your hair, wash your hair first as usual with your normal shampoo.  3. After you shampoo, rinse your hair and body thoroughly to remove the shampoo.  4. Use CHG as you would any other liquid soap. You can apply CHG directly to the skin and wash gently with a scrungie or a clean washcloth.   5. Apply the CHG Soap to your body ONLY FROM THE NECK DOWN.  Do not use on open wounds or open sores. Avoid contact with your eyes, ears, mouth and genitals (private parts). Wash genitals (private parts) with your normal soap.  6. Wash thoroughly, paying special attention to the area where your surgery will be performed.  7. Thoroughly rinse your body with warm water from the neck down.  8. DO NOT shower/wash with your normal soap after using and rinsing off the CHG Soap.  9. Pat yourself dry  with a CLEAN TOWEL.   10. Wear CLEAN PAJAMAS   11. Place CLEAN SHEETS on your bed the night of your first shower and DO NOT SLEEP WITH PETS.  Day of Surgery: Do not apply any deodorants/lotions. Please wear clean clothes to the hospital/surgery center.    Please read over the following fact sheets that you were given. Pain Booklet, Coughing and Deep Breathing, MRSA Information and Surgical Site Infection Prevention

## 2016-11-05 ENCOUNTER — Ambulatory Visit (HOSPITAL_COMMUNITY)
Admission: RE | Admit: 2016-11-05 | Discharge: 2016-11-05 | Disposition: A | Discharge status: Home / Self Care | Payer: Medicare Other | Source: Ambulatory Visit | Attending: Surgery | Admitting: Surgery

## 2016-11-05 ENCOUNTER — Encounter (HOSPITAL_COMMUNITY)
Admission: RE | Admit: 2016-11-05 | Discharge: 2016-11-05 | Disposition: A | Discharge status: Home / Self Care | Payer: Medicare Other | Source: Ambulatory Visit | Attending: Orthopaedic Surgery | Admitting: Orthopaedic Surgery

## 2016-11-05 ENCOUNTER — Encounter (HOSPITAL_COMMUNITY): Payer: Self-pay

## 2016-11-05 ENCOUNTER — Telehealth (INDEPENDENT_AMBULATORY_CARE_PROVIDER_SITE_OTHER): Payer: Self-pay | Admitting: Orthopedic Surgery

## 2016-11-05 DIAGNOSIS — Z01818 Encounter for other preprocedural examination: Secondary | ICD-10-CM | POA: Insufficient documentation

## 2016-11-05 DIAGNOSIS — M48061 Spinal stenosis, lumbar region without neurogenic claudication: Secondary | ICD-10-CM | POA: Diagnosis not present

## 2016-11-05 DIAGNOSIS — Z01810 Encounter for preprocedural cardiovascular examination: Secondary | ICD-10-CM | POA: Insufficient documentation

## 2016-11-05 DIAGNOSIS — Z01812 Encounter for preprocedural laboratory examination: Secondary | ICD-10-CM | POA: Diagnosis not present

## 2016-11-05 DIAGNOSIS — R918 Other nonspecific abnormal finding of lung field: Secondary | ICD-10-CM | POA: Diagnosis not present

## 2016-11-05 DIAGNOSIS — R001 Bradycardia, unspecified: Secondary | ICD-10-CM | POA: Insufficient documentation

## 2016-11-05 DIAGNOSIS — I517 Cardiomegaly: Secondary | ICD-10-CM | POA: Insufficient documentation

## 2016-11-05 HISTORY — DX: Cardiac murmur, unspecified: R01.1

## 2016-11-05 LAB — APTT: APTT: 25 s (ref 24–36)

## 2016-11-05 LAB — COMPREHENSIVE METABOLIC PANEL
ALBUMIN: 3.6 g/dL (ref 3.5–5.0)
ALK PHOS: 80 U/L (ref 38–126)
ALT: 14 U/L (ref 14–54)
ANION GAP: 5 (ref 5–15)
AST: 23 U/L (ref 15–41)
BILIRUBIN TOTAL: 0.7 mg/dL (ref 0.3–1.2)
BUN: 14 mg/dL (ref 6–20)
CALCIUM: 9.5 mg/dL (ref 8.9–10.3)
CO2: 24 mmol/L (ref 22–32)
CREATININE: 0.88 mg/dL (ref 0.44–1.00)
Chloride: 108 mmol/L (ref 101–111)
GFR calc Af Amer: >60 mL/min (ref >60)
GFR calc non Af Amer: 57 mL/min — ABNORMAL LOW (ref >60)
GLUCOSE: 96 mg/dL (ref 65–99)
Potassium: 3.7 mmol/L (ref 3.5–5.1)
SODIUM: 137 mmol/L (ref 135–145)
TOTAL PROTEIN: 6.9 g/dL (ref 6.5–8.1)

## 2016-11-05 LAB — URINALYSIS, ROUTINE W REFLEX MICROSCOPIC
Bilirubin Urine: NEGATIVE
GLUCOSE, UA: NEGATIVE mg/dL
HGB URINE DIPSTICK: NEGATIVE
KETONES UR: NEGATIVE mg/dL
Nitrite: NEGATIVE
PH: 5 (ref 5.0–8.0)
Protein, ur: NEGATIVE mg/dL
Specific Gravity, Urine: 1.021 (ref 1.005–1.030)

## 2016-11-05 LAB — CBC
HEMATOCRIT: 37.8 % (ref 36.0–46.0)
HEMOGLOBIN: 11.8 g/dL — AB (ref 12.0–15.0)
MCH: 26.9 pg (ref 26.0–34.0)
MCHC: 31.2 g/dL (ref 30.0–36.0)
MCV: 86.3 fL (ref 78.0–100.0)
Platelets: 266 10*3/uL (ref 150–400)
RBC: 4.38 MIL/uL (ref 3.87–5.11)
RDW: 17 % — ABNORMAL HIGH (ref 11.5–15.5)
WBC: 5.8 10*3/uL (ref 4.0–10.5)

## 2016-11-05 LAB — SURGICAL PCR SCREEN
MRSA, PCR: NEGATIVE
Staphylococcus aureus: NEGATIVE

## 2016-11-05 LAB — PROTIME-INR
INR: 0.92
Prothrombin Time: 12.2 seconds (ref 11.4–15.2)

## 2016-11-05 NOTE — Telephone Encounter (Signed)
[name] is scheduled for lumbar decompression on 11/10/2016.  Lisabeth Pick, RN called to inform Dr. Lorin Mercy that pt's pre-op urinalysis was abnormal.  Sending note to Dr. Lorin Mercy to review pre-op labs and advise.

## 2016-11-05 NOTE — Progress Notes (Signed)
Dr Lorin Mercy office called spoke with [name] informed of [place] results and asked her to let Dr Lorin Mercy know.

## 2016-11-06 MED ORDER — CIPROFLOXACIN HCL 500 MG PO TABS: 500.0000 mg | ORAL_TABLET | Freq: Two times a day (BID) | ORAL | 0 refills | Status: DC

## 2016-11-06 NOTE — Telephone Encounter (Signed)
Send in rx for cipro 500mg  one po bid times 5 days. Proceed with surgery as planned. ucall thanks

## 2016-11-06 NOTE — Progress Notes (Signed)
Anesthesia Chart Review:  Pt is an 83 year old female scheduled for L4-5 decompression for recurrent stenosis on 11/10/2016 with Rodell Perna, MD  - Receives primary care at the [facility] is Charolette Forward, MD who has cleared pt for surgery.   PMH includes:  HTN, heart murmur. Former smoker. BMI 26.5. S/p exploratory laparotomy, small bowel resection 03/23/13.   - Hospitalized multiple times in 2015 starting in January 2015 with small bowel obstruction complicated by acute kidney failure, acute respiratory failure (s/p trach, prolonged intubation), diastolic HF.    Medications include: Amlodipine, ASA 81 mg, Imdur, metoprolol  BP (!) 144/60   Pulse 68   Temp 36.6 C (Oral)   Resp 20   Ht 5\' 1"  (1.549 m)   Wt 139 lb 12.4 oz (63.4 kg)   SpO2 100%   BMI 26.41 kg/m   Preoperative labs reviewed.    CXR 11/05/16:  1. No acute finding. 2. Chronic interstitial opacities with fibrotic features. 3. Chronic cardiomegaly.  EKG 11/05/16: Sinus bradycardia (54 bpm)  Echo 06/03/14:  - Left ventricle: The cavity size was normal. Wall thickness was increased in a pattern of moderate LVH. Systolic function was normal. The estimated ejection fraction was in the range of 50% to 55%. Doppler parameters are consistent with abnormal left ventricular relaxation (grade 1 diastolic dysfunction). - Aortic valve: There was trivial regurgitation. - Mitral valve: There was mild regurgitation. - Atrial septum: No defect or patent foramen ovale was identified. - Pulmonary arteries: PA peak pressure: 47 mm Hg (S). - Pericardium, extracardiac: A trivial pericardial effusion was identified posterior to the heart.  If no changes, I anticipate pt can proceed with surgery as scheduled.   Willeen Cass, FNP-BC [facility]/Anesthesiology Phone: [phone] 11/06/2016 11:22 AM

## 2016-11-06 NOTE — Telephone Encounter (Signed)
I called patient and advised. Cipro sent to [facility] on [address] per patient request.   fyi on surgery

## 2016-11-06 NOTE — Addendum Note (Signed)
Addended by: Meyer Cory on: 11/06/2016 09:56 AM   Modules accepted: Orders

## 2016-11-10 ENCOUNTER — Ambulatory Visit (HOSPITAL_COMMUNITY): Payer: Medicare Other | Admitting: Anesthesiology

## 2016-11-10 ENCOUNTER — Ambulatory Visit (HOSPITAL_COMMUNITY): Payer: Medicare Other

## 2016-11-10 ENCOUNTER — Ambulatory Visit (HOSPITAL_COMMUNITY)
Admission: RE | Admit: 2016-11-10 | Discharge: 2016-11-11 | Disposition: A | Discharge status: Home / Self Care | Payer: Medicare Other | Source: Ambulatory Visit | Attending: Orthopaedic Surgery | Admitting: Orthopaedic Surgery

## 2016-11-10 ENCOUNTER — Ambulatory Visit (HOSPITAL_COMMUNITY): Payer: Medicare Other | Admitting: Emergency Medicine

## 2016-11-10 ENCOUNTER — Encounter (HOSPITAL_COMMUNITY): Payer: Self-pay

## 2016-11-10 ENCOUNTER — Encounter (HOSPITAL_COMMUNITY)
Admission: RE | Disposition: A | Discharge status: Home / Self Care | Payer: Self-pay | Source: Ambulatory Visit | Attending: Orthopaedic Surgery

## 2016-11-10 DIAGNOSIS — G2581 Restless legs syndrome: Secondary | ICD-10-CM | POA: Diagnosis not present

## 2016-11-10 DIAGNOSIS — Z419 Encounter for procedure for purposes other than remedying health state, unspecified: Secondary | ICD-10-CM

## 2016-11-10 DIAGNOSIS — Z79899 Other long term (current) drug therapy: Secondary | ICD-10-CM | POA: Diagnosis not present

## 2016-11-10 DIAGNOSIS — M48062 Spinal stenosis, lumbar region with neurogenic claudication: Secondary | ICD-10-CM | POA: Insufficient documentation

## 2016-11-10 DIAGNOSIS — Z880 Allergy status to penicillin: Secondary | ICD-10-CM | POA: Diagnosis not present

## 2016-11-10 DIAGNOSIS — I10 Essential (primary) hypertension: Secondary | ICD-10-CM | POA: Insufficient documentation

## 2016-11-10 DIAGNOSIS — E785 Hyperlipidemia, unspecified: Secondary | ICD-10-CM | POA: Insufficient documentation

## 2016-11-10 DIAGNOSIS — Z87891 Personal history of nicotine dependence: Secondary | ICD-10-CM | POA: Insufficient documentation

## 2016-11-10 DIAGNOSIS — M48061 Spinal stenosis, lumbar region without neurogenic claudication: Secondary | ICD-10-CM | POA: Diagnosis present

## 2016-11-10 HISTORY — DX: Spinal stenosis, lumbar region without neurogenic claudication: M48.061

## 2016-11-10 HISTORY — PX: LUMBAR LAMINECTOMY/DECOMPRESSION MICRODISCECTOMY: SHX5026

## 2016-11-10 SURGERY — LUMBAR LAMINECTOMY/DECOMPRESSION MICRODISCECTOMY
Anesthesia: General

## 2016-11-10 MED ORDER — ISOSORBIDE MONONITRATE ER 30 MG PO TB24
45.0000 mg | ORAL_TABLET | Freq: Every day | ORAL | Status: DC
  Filled 2016-11-10: qty 1

## 2016-11-10 MED ORDER — BUPIVACAINE HCL (PF) 0.5 % IJ SOLN
INTRAMUSCULAR | Status: DC | PRN
  Administered 2016-11-10: 10 mL

## 2016-11-10 MED ORDER — DEXAMETHASONE SODIUM PHOSPHATE 10 MG/ML IJ SOLN
INTRAMUSCULAR | Status: DC | PRN
  Administered 2016-11-10: 5 mg via INTRAVENOUS

## 2016-11-10 MED ORDER — POLYETHYLENE GLYCOL 3350 17 G PO PACK: 17.0000 g | PACK | Freq: Every day | ORAL | Status: DC | PRN

## 2016-11-10 MED ORDER — HYDROXYZINE HCL 25 MG PO TABS: 25.0000 mg | ORAL_TABLET | Freq: Every day | ORAL | Status: DC | PRN

## 2016-11-10 MED ORDER — ROCURONIUM BROMIDE 100 MG/10ML IV SOLN
INTRAVENOUS | Status: DC | PRN
  Administered 2016-11-10: 40 mg via INTRAVENOUS

## 2016-11-10 MED ORDER — ACETAMINOPHEN 10 MG/ML IV SOLN: 1000.0000 mg | Freq: Once | INTRAVENOUS | Status: DC | PRN

## 2016-11-10 MED ORDER — ONDANSETRON HCL 4 MG PO TABS: 4.0000 mg | ORAL_TABLET | Freq: Four times a day (QID) | ORAL | Status: DC | PRN

## 2016-11-10 MED ORDER — FENTANYL CITRATE (PF) 100 MCG/2ML IJ SOLN: 25.0000 ug | INTRAMUSCULAR | Status: DC | PRN

## 2016-11-10 MED ORDER — CHLORHEXIDINE GLUCONATE 4 % EX LIQD: 60.0000 mL | Freq: Once | CUTANEOUS | Status: DC

## 2016-11-10 MED ORDER — MEPERIDINE HCL 25 MG/ML IJ SOLN: 6.2500 mg | INTRAMUSCULAR | Status: DC | PRN

## 2016-11-10 MED ORDER — EPHEDRINE SULFATE 50 MG/ML IJ SOLN
INTRAMUSCULAR | Status: DC | PRN
  Administered 2016-11-10 (×2): 10 mg via INTRAVENOUS

## 2016-11-10 MED ORDER — VANCOMYCIN HCL IN DEXTROSE 750-5 MG/150ML-% IV SOLN
750.0000 mg | Freq: Once | INTRAVENOUS | Status: AC
  Administered 2016-11-11: 750 mg via INTRAVENOUS
  Filled 2016-11-10: qty 150

## 2016-11-10 MED ORDER — SODIUM CHLORIDE 0.9 % IV SOLN: INTRAVENOUS | Status: DC

## 2016-11-10 MED ORDER — SODIUM CHLORIDE 0.9% FLUSH: 3.0000 mL | Freq: Two times a day (BID) | INTRAVENOUS | Status: DC

## 2016-11-10 MED ORDER — LACTATED RINGERS IV SOLN
INTRAVENOUS | Status: DC
  Administered 2016-11-10: 12:00:00 via INTRAVENOUS

## 2016-11-10 MED ORDER — VANCOMYCIN HCL IN DEXTROSE 1-5 GM/200ML-% IV SOLN
1000.0000 mg | INTRAVENOUS | Status: AC
  Administered 2016-11-10: 1000 mg via INTRAVENOUS
  Filled 2016-11-10: qty 200

## 2016-11-10 MED ORDER — MENTHOL 3 MG MT LOZG: 1.0000 | LOZENGE | OROMUCOSAL | Status: DC | PRN

## 2016-11-10 MED ORDER — LOSARTAN POTASSIUM 50 MG PO TABS: 50.0000 mg | ORAL_TABLET | Freq: Every day | ORAL | Status: DC

## 2016-11-10 MED ORDER — ONDANSETRON HCL 4 MG/2ML IJ SOLN
INTRAMUSCULAR | Status: AC
  Filled 2016-11-10: qty 2

## 2016-11-10 MED ORDER — METOPROLOL SUCCINATE ER 25 MG PO TB24: 25.0000 mg | ORAL_TABLET | Freq: Every day | ORAL | Status: DC

## 2016-11-10 MED ORDER — ONDANSETRON HCL 4 MG/2ML IJ SOLN
INTRAMUSCULAR | Status: DC | PRN
  Administered 2016-11-10: 4 mg via INTRAVENOUS

## 2016-11-10 MED ORDER — BUPIVACAINE-EPINEPHRINE (PF) 0.25% -1:200000 IJ SOLN
INTRAMUSCULAR | Status: AC
  Filled 2016-11-10: qty 30

## 2016-11-10 MED ORDER — HEMOSTATIC AGENTS (NO CHARGE) OPTIME
TOPICAL | Status: DC | PRN
  Administered 2016-11-10: 1 via TOPICAL

## 2016-11-10 MED ORDER — BUPIVACAINE HCL (PF) 0.5 % IJ SOLN
INTRAMUSCULAR | Status: AC
  Filled 2016-11-10: qty 30

## 2016-11-10 MED ORDER — 0.9 % SODIUM CHLORIDE (POUR BTL) OPTIME
TOPICAL | Status: DC | PRN
  Administered 2016-11-10: 1000 mL

## 2016-11-10 MED ORDER — DOCUSATE SODIUM 100 MG PO CAPS
100.0000 mg | ORAL_CAPSULE | Freq: Two times a day (BID) | ORAL | Status: DC
  Administered 2016-11-10: 100 mg via ORAL

## 2016-11-10 MED ORDER — DEXAMETHASONE SODIUM PHOSPHATE 10 MG/ML IJ SOLN
INTRAMUSCULAR | Status: AC
  Filled 2016-11-10: qty 1

## 2016-11-10 MED ORDER — LIDOCAINE HCL (CARDIAC) 20 MG/ML IV SOLN
INTRAVENOUS | Status: DC | PRN
  Administered 2016-11-10: 60 mg via INTRAVENOUS

## 2016-11-10 MED ORDER — ONDANSETRON HCL 4 MG/2ML IJ SOLN: 4.0000 mg | Freq: Four times a day (QID) | INTRAMUSCULAR | Status: DC | PRN

## 2016-11-10 MED ORDER — SUGAMMADEX SODIUM 200 MG/2ML IV SOLN
INTRAVENOUS | Status: DC | PRN
  Administered 2016-11-10: 150 mg via INTRAVENOUS

## 2016-11-10 MED ORDER — SODIUM CHLORIDE 0.9 % IV SOLN: 250.0000 mL | INTRAVENOUS | Status: DC

## 2016-11-10 MED ORDER — FENTANYL CITRATE (PF) 250 MCG/5ML IJ SOLN
INTRAMUSCULAR | Status: AC
  Filled 2016-11-10: qty 5

## 2016-11-10 MED ORDER — FENTANYL CITRATE (PF) 100 MCG/2ML IJ SOLN
INTRAMUSCULAR | Status: DC | PRN
  Administered 2016-11-10: 50 ug via INTRAVENOUS
  Administered 2016-11-10: 25 ug via INTRAVENOUS
  Administered 2016-11-10: 50 ug via INTRAVENOUS
  Administered 2016-11-10: 100 ug via INTRAVENOUS
  Administered 2016-11-10: 25 ug via INTRAVENOUS

## 2016-11-10 MED ORDER — AMLODIPINE BESYLATE 10 MG PO TABS
10.0000 mg | ORAL_TABLET | Freq: Every day | ORAL | Status: DC
  Filled 2016-11-10: qty 1

## 2016-11-10 MED ORDER — SODIUM CHLORIDE 0.9% FLUSH: 3.0000 mL | INTRAVENOUS | Status: DC | PRN

## 2016-11-10 MED ORDER — PROMETHAZINE HCL 25 MG/ML IJ SOLN: 6.2500 mg | INTRAMUSCULAR | Status: DC | PRN

## 2016-11-10 MED ORDER — ACETAMINOPHEN 325 MG PO TABS
650.0000 mg | ORAL_TABLET | ORAL | Status: DC | PRN
  Administered 2016-11-11 (×2): 650 mg via ORAL
  Filled 2016-11-10 (×2): qty 2

## 2016-11-10 MED ORDER — ACETAMINOPHEN 650 MG RE SUPP: 650.0000 mg | RECTAL | Status: DC | PRN

## 2016-11-10 MED ORDER — PHENOL 1.4 % MT LIQD: 1.0000 | OROMUCOSAL | Status: DC | PRN

## 2016-11-10 MED ORDER — PROPOFOL 10 MG/ML IV BOLUS
INTRAVENOUS | Status: DC | PRN
  Administered 2016-11-10: 80 mg via INTRAVENOUS
  Administered 2016-11-10: 100 mg via INTRAVENOUS
  Administered 2016-11-10: 20 mg via INTRAVENOUS

## 2016-11-10 MED ORDER — HYDROCODONE-ACETAMINOPHEN 5-325 MG PO TABS: 1.0000 | ORAL_TABLET | Freq: Four times a day (QID) | ORAL | Status: DC | PRN

## 2016-11-10 SURGICAL SUPPLY — 47 items
BENZOIN TINCTURE PRP APPL 2/3 (GAUZE/BANDAGES/DRESSINGS) ×3 IMPLANT
BUR ROUND FLUTED 4 SOFT TCH (BURR) ×2 IMPLANT
BUR ROUND FLUTED 4MM SOFT TCH (BURR) ×1
CANISTER SUCT 3000ML PPV (MISCELLANEOUS) IMPLANT
CLOSURE STERI-STRIP 1/2X4 (GAUZE/BANDAGES/DRESSINGS) ×1
CLSR STERI-STRIP ANTIMIC 1/2X4 (GAUZE/BANDAGES/DRESSINGS) ×2 IMPLANT
COVER SURGICAL LIGHT HANDLE (MISCELLANEOUS) ×3 IMPLANT
DECANTER SPIKE VIAL GLASS SM (MISCELLANEOUS) IMPLANT
DERMABOND ADVANCED (GAUZE/BANDAGES/DRESSINGS)
DERMABOND ADVANCED .7 DNX12 (GAUZE/BANDAGES/DRESSINGS) IMPLANT
DRAPE HALF SHEET 40X57 (DRAPES) IMPLANT
DRAPE MICROSCOPE LEICA (MISCELLANEOUS) ×3 IMPLANT
DRAPE SURG 17X23 STRL (DRAPES) ×3 IMPLANT
DRSG MEPILEX BORDER 4X4 (GAUZE/BANDAGES/DRESSINGS) ×3 IMPLANT
DRSG MEPITEL 3X4 ME34 (GAUZE/BANDAGES/DRESSINGS) ×3 IMPLANT
DURAPREP 26ML APPLICATOR (WOUND CARE) ×3 IMPLANT
ELECT REM PT RETURN 9FT ADLT (ELECTROSURGICAL) ×3
ELECTRODE REM PT RTRN 9FT ADLT (ELECTROSURGICAL) ×1 IMPLANT
GLOVE BIOGEL PI IND STRL 8 (GLOVE) ×2 IMPLANT
GLOVE BIOGEL PI INDICATOR 8 (GLOVE) ×4
GLOVE ORTHO TXT STRL SZ7.5 (GLOVE) ×6 IMPLANT
GOWN STRL REUS W/ TWL LRG LVL3 (GOWN DISPOSABLE) ×2 IMPLANT
GOWN STRL REUS W/ TWL XL LVL3 (GOWN DISPOSABLE) ×1 IMPLANT
GOWN STRL REUS W/TWL 2XL LVL3 (GOWN DISPOSABLE) ×3 IMPLANT
GOWN STRL REUS W/TWL LRG LVL3 (GOWN DISPOSABLE) ×4
GOWN STRL REUS W/TWL XL LVL3 (GOWN DISPOSABLE) ×2
KIT BASIN OR (CUSTOM PROCEDURE TRAY) ×3 IMPLANT
KIT ROOM TURNOVER OR (KITS) ×3 IMPLANT
MANIFOLD NEPTUNE II (INSTRUMENTS) ×3 IMPLANT
NEEDLE HYPO 25GX1X1/2 BEV (NEEDLE) ×3 IMPLANT
NEEDLE SPNL 18GX3.5 QUINCKE PK (NEEDLE) ×3 IMPLANT
NS IRRIG 1000ML POUR BTL (IV SOLUTION) ×3 IMPLANT
PACK LAMINECTOMY ORTHO (CUSTOM PROCEDURE TRAY) ×3 IMPLANT
PAD ARMBOARD 7.5X6 YLW CONV (MISCELLANEOUS) ×6 IMPLANT
PATTIES SURGICAL .5 X.5 (GAUZE/BANDAGES/DRESSINGS) IMPLANT
PATTIES SURGICAL .75X.75 (GAUZE/BANDAGES/DRESSINGS) ×3 IMPLANT
SPONGE LAP 4X18 X RAY DECT (DISPOSABLE) ×3 IMPLANT
SURGIFLO W/THROMBIN 8M KIT (HEMOSTASIS) ×3 IMPLANT
SUT VIC AB 0 CT1 27 (SUTURE)
SUT VIC AB 0 CT1 27XBRD ANBCTR (SUTURE) IMPLANT
SUT VIC AB 1 CT1 27 (SUTURE) ×2
SUT VIC AB 1 CT1 27XBRD ANBCTR (SUTURE) ×1 IMPLANT
SUT VIC AB 2-0 CT1 27 (SUTURE) ×2
SUT VIC AB 2-0 CT1 TAPERPNT 27 (SUTURE) ×1 IMPLANT
SUT VIC AB 3-0 X1 27 (SUTURE) ×3 IMPLANT
TOWEL OR 17X24 6PK STRL BLUE (TOWEL DISPOSABLE) ×3 IMPLANT
TOWEL OR 17X26 10 PK STRL BLUE (TOWEL DISPOSABLE) ×3 IMPLANT

## 2016-11-10 NOTE — Interval H&P Note (Signed)
History and Physical Interval Note:  11/10/2016 12:30 PM  Kristi Alexander  has presented today for surgery, with the diagnosis of L4-5 Stenosis  The various methods of treatment have been discussed with the patient and family. After consideration of risks, benefits and other options for treatment, the patient has consented to  Procedure(s): L4-5 DECOMPRESSION FOR RECURRENT STENOSIS (N/A) as a surgical intervention .  The patient's history has been reviewed, patient examined, no change in status, stable for surgery.  I have reviewed the patient's chart and labs.  Questions were answered to the patient's satisfaction.     Marybelle Killings

## 2016-11-10 NOTE — Anesthesia Postprocedure Evaluation (Signed)
Anesthesia Post Note  Patient: Kristi Alexander  Procedure(s) Performed: Procedure(s) (LRB): L4-5 DECOMPRESSION FOR RECURRENT STENOSIS (N/A)     Patient location during evaluation: PACU Anesthesia Type: General Level of consciousness: awake Pain management: pain level controlled Vital Signs Assessment: post-procedure vital signs reviewed and stable Respiratory status: spontaneous breathing Cardiovascular status: stable Postop Assessment: no apparent nausea or vomiting Anesthetic complications: no    Last Vitals:  Vitals:   11/10/16 1526 11/10/16 1530  BP: (!) 156/71   Pulse: 65 65  Resp:  (!) 22  Temp: 36.7 C   SpO2: 96% 96%    Last Pain:  Vitals:   11/10/16 1526  TempSrc:   PainSc: 0-No pain   Pain Goal: Patients Stated Pain Goal: 0 (11/10/16 1117)               [place]

## 2016-11-10 NOTE — Op Note (Signed)
Preop diagnosis L4-5 stenosis with neurogenic claudication  Postop diagnosis: Same  Procedure: L4-5 central decompression for multifactorial spinal stenosis.  Surgeon: Rodell Perna M.D.  Anesthesia: Gen. plus Marcaine local  Asst.: Benjiman Core PA-C medically necessary and present for the entire procedure.  Drains none  EBL: Minimal see anesthetic record.  Procedure after induction of general anesthesia standard prepping and draping sterile skin marker and the old incision from patient's previous microdiscectomy for disc herniation L3-4 and 2011 Betadine Steri-Drape was applied laminectomy sheets and drapes timeout procedure preoperative vancomycin was given to the patient penicillin allergy.  Timeout procedure was completed midline incision was made so process section on the lamina and Coker clamps are placed at the expected level for plan decompression confirmed the lateral. This concluded the complete laminectomy at 4 and partial laminectomy at the top of L5 taking the upper one fourth of the lamina. Chunks spinous process removed with the Kerrison run sure. Lamina was thinned with a 4 mm bur. There was extremely thick chunks of ligamentum which were adherent to the dura. Operative microscope was used to peel off the ligamentum that was stuck to the dura from previous epidural injections and then removal of the hypertrophic ligamentum. Overhanging facet spurs are turned back. Patient had broad-based disc bulge with spur formation at L4 ventrally. This area was hard there is no soft tissue bulging. Overhanging spurs removed off the level of the pedicle. Dural tube was around and was decompressed at the level of the epidural fat at the health 3-4 interlaminar space gutters were checked which reveals no range remaining chunks of ligament or overhanging spurs copious irrigation. Closure the fascia with #1 Vicryl 2-0 Vicryl subtendinous tissue skin closure postop dressing and transferred recovery  room.

## 2016-11-10 NOTE — Transfer of Care (Signed)
Immediate Anesthesia Transfer of Care Note  Patient: HELGA ASBURY  Procedure(s) Performed: Procedure(s): L4-5 DECOMPRESSION FOR RECURRENT STENOSIS (N/A)  Patient Location: PACU  Anesthesia Type:General  Level of Consciousness: awake, alert , oriented and patient cooperative  Airway & Oxygen Therapy: Patient Spontanous Breathing  Post-op Assessment: Report given to RN and Post -op Vital signs reviewed and stable  Post vital signs: Reviewed and stable  Last Vitals:  Vitals:   11/10/16 1049 11/10/16 1426  BP: (!) 165/67 (!) 139/57  Pulse: 64 68  Resp: 19 19  Temp: 36.8 C (!) 36.2 C  SpO2: 100% 99%    Last Pain:  Vitals:   11/10/16 1117  TempSrc:   PainSc: 3       Patients Stated Pain Goal: 0 (98/72/15 8727)  Complications: No apparent anesthesia complications

## 2016-11-10 NOTE — Anesthesia Procedure Notes (Addendum)
Procedure Name: Intubation Date/Time: 11/10/2016 12:52 PM Performed by: Salli Quarry Ryatt Corsino Pre-anesthesia Checklist: Patient identified, Emergency Drugs available, Suction available, Patient being monitored and Timeout performed Patient Re-evaluated:Patient Re-evaluated prior to induction Oxygen Delivery Method: Circle system utilized Preoxygenation: Pre-oxygenation with 100% oxygen Induction Type: IV induction Ventilation: Mask ventilation without difficulty Laryngoscope Size: Mac and 4 Tube type: Oral Tube size: 7.5 mm Number of attempts: 1 Airway Equipment and Method: Stylet Placement Confirmation: ETT inserted through vocal cords under direct vision,  positive ETCO2,  CO2 detector and breath sounds checked- equal and bilateral Secured at: 21 cm Tube secured with: Tape Comments: Intubation by Serina Cowper, SRNA

## 2016-11-10 NOTE — Progress Notes (Signed)
Physical Therapy Evaluation Patient Details Name: Kristi Alexander MRN: [id number] DOB: December 16, 1925 Today's Date: 11/10/2016   History of Present Illness  83 y.o. female s/p L4-L5 decompression for recurrent stenosis  Clinical Impression  Prior to surgical procedure, patient was modified independent with mobility and self-care activities. Used cane PRN for added stability at home and in the community. Educated patient on surgical back precautions. Patient has adequate caregiver access at home and is mobilizing well at this time, not recommending any further PT services.  Follow Up Recommendations No PT follow up;Supervision for mobility/OOB    Equipment Recommendations  None recommended by PT    Recommendations for Other Services       Precautions / Restrictions Precautions Precautions: Back Precaution Booklet Issued: Yes (comment) Precaution Comments: reviewed precautions with patient and daughter who was present throughout session. Restrictions Weight Bearing Restrictions: No      Mobility  Bed Mobility Overal bed mobility: Needs Assistance Bed Mobility: Rolling;Sidelying to Sit Rolling: Supervision Sidelying to sit: Min assist       General bed mobility comments: log roll with verbal cueing for proper technique. min assist for bringing trunk into upright  Transfers Overall transfer level: Needs assistance Equipment used: None Transfers: Sit to/from Stand Sit to Stand: Supervision         General transfer comment: supervision for safety  Ambulation/Gait Ambulation/Gait assistance: Supervision;Min guard Ambulation Distance (Feet): 200 Feet Assistive device: 1 person hand held assist Gait Pattern/deviations: Step-through pattern;Decreased stride length;Narrow base of support Gait velocity: decreased Gait velocity interpretation: Below normal speed for age/gender General Gait Details: slow, cautious gait with no significant LOB. min guard early on for safety but  able to ambulate with supervision  Stairs            Wheelchair Mobility    Modified Rankin (Stroke Patients Only)       Balance Overall balance assessment: No apparent balance deficits (not formally assessed) (patient danced while ambulating; no LOB)                                           Pertinent Vitals/Pain Pain Assessment: No/denies pain    Home Living Family/patient expects to be discharged to:: Private residence Living Arrangements: Children Available Help at Discharge: Family Type of Home: House Home Access: Level entry     Home Layout: One level Home Equipment: Environmental consultant - 2 wheels;Cane - quad;Shower seat      Prior Function Level of Independence: Independent with assistive device(s)               Hand Dominance   Dominant Hand: Right    Extremity/Trunk Assessment   Upper Extremity Assessment Upper Extremity Assessment: Overall WFL for tasks assessed    Lower Extremity Assessment Lower Extremity Assessment: Overall WFL for tasks assessed (strength BLEs 4+/5; sensation intact)       Communication   Communication: No difficulties  Cognition Arousal/Alertness: Awake/alert Behavior During Therapy: WFL for tasks assessed/performed Overall Cognitive Status: Within Functional Limits for tasks assessed                                 General Comments: cognition not formally asssessed      General Comments      Exercises     Assessment/Plan    PT Assessment Patent does  not need any further PT services  PT Problem List         PT Treatment Interventions      PT Goals (Current goals can be found in the Care Plan section)  Acute Rehab PT Goals Patient Stated Goal: return home  PT Goal Formulation: With patient Time For Goal Achievement: 11/24/16 Potential to Achieve Goals: Good    Frequency     Barriers to discharge        Co-evaluation               AM-PAC PT "6 Clicks" Daily  Activity  Outcome Measure Difficulty turning over in bed (including adjusting bedclothes, sheets and blankets)?: A Lot Difficulty moving from lying on back to sitting on the side of the bed? : A Lot Difficulty sitting down on and standing up from a chair with arms (e.g., wheelchair, bedside commode, etc,.)?: A Little Help needed moving to and from a bed to chair (including a wheelchair)?: A Little Help needed walking in hospital room?: A Little Help needed climbing 3-5 steps with a railing? : A Little 6 Click Score: 16    End of Session Equipment Utilized During Treatment: Gait belt Activity Tolerance: Patient tolerated treatment well Patient left: in chair;with call bell/phone within reach;with family/visitor present Nurse Communication: Mobility status PT Visit Diagnosis: Difficulty in walking, not elsewhere classified (R26.2)    Time: 3382-5053 PT Time Calculation (min) (ACUTE ONLY): 20 min   Charges:   PT Evaluation $PT Eval Low Complexity: 1 Low     PT G CodesSindy Guadeloupe, SPT [phone] office   Margarita Grizzle 11/10/2016, 5:09 PM

## 2016-11-10 NOTE — H&P (Signed)
Kristi Alexander is an 83 y.o. female.   Chief Complaint: back and leg pain HPI: patient with hx of L4-5 stenosis and above complaint presents for surgical intervention.  Progressively worsening symptoms.  Failed conservative treatment.    Past Medical History:  Diagnosis Date  . Abnormality of gait 11/22/2014  . Arthritis   . Cyst of breast, right, benign solitary   . Dyslipidemia   . H/O: hysterectomy   . Heart murmur   . History of appendectomy   . History of lumbosacral spine surgery   . Hypertension   . Inguinal hernia   . Restless leg syndrome     Past Surgical History:  Procedure Laterality Date  . ABDOMINAL HYSTERECTOMY    . APPENDECTOMY    . BOWEL RESECTION N/A 03/23/2013   Procedure: SMALL BOWEL RESECTION;  Surgeon: Gwenyth Ober, MD;  Location: [place];  Service: General;  Laterality: N/A;  . BREAST CYST EXCISION     LEFT  . ESOPHAGOGASTRODUODENOSCOPY N/A 04/01/2013   Procedure: ESOPHAGOGASTRODUODENOSCOPY (EGD);  Surgeon: Gwenyth Ober, MD;  Location: [facility];  Service: General;  Laterality: N/A;  . HERNIA REPAIR    . LAPAROTOMY N/A 03/23/2013   Procedure: EXPLORATORY LAPAROTOMY;  Surgeon: Gwenyth Ober, MD;  Location: [facility];  Service: General;  Laterality: N/A;  . PEG PLACEMENT N/A 04/01/2013   Procedure: PERCUTANEOUS ENDOSCOPIC GASTROSTOMY (PEG) PLACEMENT;  Surgeon: Gwenyth Ober, MD;  Location: [facility];  Service: General;  Laterality: N/A;  . TRACHEOSTOMY     feinstein    Family History  Problem Relation Age of Onset  . Heart attack Father   . Lung cancer Brother   . Healthy Sister    Social History:  reports that she quit smoking about 13 years ago. Her smoking use included Cigarettes. She smoked 0.00 packs per day. She has never used smokeless tobacco. She reports that she does not drink alcohol or use drugs.  Allergies:  Allergies  Allergen Reactions  . Penicillins Swelling    Has patient had a PCN reaction causing immediate rash,  facial/tongue/throat swelling, SOB or lightheadedness with hypotension: Yes Has patient had a PCN reaction causing severe rash involving mucus membranes or skin necrosis: No Has patient had a PCN reaction that required hospitalization: No Has patient had a PCN reaction occurring within the last 10 years: No If all of the above answers are "NO", then may proceed with Cephalosporin use.   . Pravastatin     Cramps     No prescriptions prior to admission.    No results found for this or any previous visit (from the past 48 hour(s)). No results found.  Review of Systems  Constitutional: Negative.   HENT: Negative.   Respiratory: Negative.   Cardiovascular: Negative.   Gastrointestinal: Negative.   Genitourinary: Negative.   Musculoskeletal: Positive for back pain.  Skin: Negative.   Neurological: Positive for tingling.  Psychiatric/Behavioral: Negative.     There were no vitals taken for this visit. Physical Exam  Constitutional: She is oriented to person, place, and time. She appears well-developed. No distress.  HENT:  Head: Normocephalic.  Eyes: Pupils are equal, round, and reactive to light. EOM are normal.  Neck: Normal range of motion.  Respiratory: No respiratory distress.  GI: She exhibits no distension.  Musculoskeletal: She exhibits tenderness.  Neurological: She is alert and oriented to person, place, and time.  Skin: Skin is warm and dry.  Psychiatric: She has a normal mood and affect.  Assessment/Plan L4-5 stenosis/HNP, back pain and leg pain.  Will proceed withL4-5 DECOMPRESSION FOR RECURRENT STENOSIS as scheduled. Surgical procedure along with possible risks and complications discussed.  All questions answered and wishes to proceed.    Benjiman Core, PA-C 11/10/2016, 7:05 AM

## 2016-11-10 NOTE — Anesthesia Preprocedure Evaluation (Signed)
Anesthesia Evaluation  Patient identified by MRN, date of birth, ID band Patient awake    Reviewed: Allergy & Precautions, H&P , NPO status , Patient's Chart, lab work & pertinent test results, reviewed documented beta blocker date and time   Airway Mallampati: II  TM Distance: >3 FB     Dental  (+) Partial Lower, Partial Upper   Pulmonary Current Smoker, former smoker,    Pulmonary exam normal breath sounds clear to auscultation       Cardiovascular hypertension, Pt. on medications and Pt. on home beta blockers Normal cardiovascular exam Rhythm:Regular Rate:Normal     Neuro/Psych negative psych ROS   GI/Hepatic negative GI ROS, Neg liver ROS,   Endo/Other    Renal/GU negative Renal ROS  negative genitourinary   Musculoskeletal   Abdominal Normal abdominal exam  (+)   Peds  Hematology   Anesthesia Other Findings Kristi Alexander  2D Echocardiogram with contrast  Order# [id number]  Ordering physician: Allie Bossier, MD Study date: 06/03/2014 Study Result   Result status: Final result               *[facility]*            [address], [place] [address]              [phone]  ------------------------------------------------------------------- Transthoracic Echocardiography  Patient:  Kristi Alexander, Kristi Alexander MR #:    [id number] Study Date: 06/03/2014 Gender:   F Age:    83 Height:   154 cm Weight:   55 kg BSA:    1.54 m^2 Pt. Status: Room:    [facility] ADMITTING  Melton Alar ATTENDING  Dia Crawford J ORDERING   [place], Curtis J REFERRING  [facility], Curtis J PERFORMING  [facility], Inpatient  cc:  ------------------------------------------------------------------- LV EF: 50% -   55%  ------------------------------------------------------------------- Indications:   Bradycardia 427.81. Presyncope 780.2.  ------------------------------------------------------------------- History:  PMH: Anemia. Restless leg syndrome. Acute on chronic renal failure. Chronic cough. Congestive heart failure.  ------------------------------------------------------------------- Study Conclusions  - Left ventricle: The cavity size was normal. Wall thickness was increased in a pattern of moderate LVH. Systolic function was normal. The estimated ejection fraction was in the range of 50% to 55%. Doppler parameters are consistent with abnormal left ventricular relaxation (grade 1 diastolic dysfunction). - Aortic valve: There was trivial regurgitation. - Mitral valve: There was mild regurgitation. - Atrial septum: No defect or patent foramen ovale was identified. - Pulmonary arteries: PA peak pressure: 47 mm Hg (S). - Pericardium, extracardiac: A trivial pericardial effusion was identified posterior to the heart.  Transthoracic echocardiography. M-mode, complete 2D, spectral    Reproductive/Obstetrics                             Anesthesia Physical  Anesthesia Plan  ASA: III  Anesthesia Plan: General   Post-op Pain Management:    Induction: Intravenous  PONV Risk Score and Plan: 4 or greater and Ondansetron, Dexamethasone and Treatment may vary due to age or medical condition  Airway Management Planned: Oral ETT  Additional Equipment:   Intra-op Plan:   Post-operative Plan: Extubation in OR  Informed Consent: I have reviewed the patients History and Physical, chart, labs and discussed the procedure including the risks, benefits and alternatives for the  proposed anesthesia with the patient or authorized representative who has indicated his/her understanding and acceptance.   Dental advisory given  Plan Discussed with: CRNA  and Surgeon  Anesthesia Plan Comments:         Anesthesia Quick Evaluation

## 2016-11-11 ENCOUNTER — Telehealth (INDEPENDENT_AMBULATORY_CARE_PROVIDER_SITE_OTHER): Payer: Self-pay | Admitting: Orthopaedic Surgery

## 2016-11-11 ENCOUNTER — Encounter (HOSPITAL_COMMUNITY): Payer: Self-pay | Admitting: Orthopaedic Surgery

## 2016-11-11 DIAGNOSIS — M48062 Spinal stenosis, lumbar region with neurogenic claudication: Secondary | ICD-10-CM | POA: Diagnosis not present

## 2016-11-11 MED ORDER — HYDROCODONE-ACETAMINOPHEN 5-325 MG PO TABS: 1.0000 | ORAL_TABLET | Freq: Four times a day (QID) | ORAL | 0 refills | Status: DC | PRN

## 2016-11-11 NOTE — Telephone Encounter (Signed)
ucall , yes . Do so . Thanks . I tried but she did not answer.

## 2016-11-11 NOTE — Progress Notes (Signed)
Patient alert and oriented, mae's well, voiding adequate amount of urine, swallowing without difficulty, no c/o pain at time of discharge. Patient discharged home with family. Script and discharged instructions given to patient. Patient and family stated understanding of instructions given. Patient has an appointment with Dr. Yates  

## 2016-11-11 NOTE — Telephone Encounter (Signed)
Please advise 

## 2016-11-11 NOTE — Telephone Encounter (Signed)
Courtney from Case Management called this morning and wanted to see if Dr. Lorin Mercy is okay with her discharging the patient today since it was an outpatient procedure.  CB#[phone].  Thank you.

## 2016-11-11 NOTE — Care Management Obs Status (Signed)
[place] NOTIFICATION   Patient Details  Name: Kristi Alexander MRN: [id number] Date of Birth: July 27, 1925   Medicare Observation Status Notification Given:  Yes    Ninfa Meeker, RN 11/11/2016, 10:05 AM

## 2016-11-11 NOTE — Progress Notes (Signed)
   Subjective: 1 Day Post-Op Procedure(s) (LRB): L4-5 DECOMPRESSION FOR RECURRENT STENOSIS (N/A) Patient reports pain as mild.    Objective: Vital signs in last 24 hours: Temp:  [97.2 F (36.2 C)-98.3 F (36.8 C)] 97.9 F (36.6 C) (09/18 0401) Pulse Rate:  [57-71] 62 (09/18 0401) Resp:  [8-22] 17 (09/18 0401) BP: (130-175)/(53-71) 155/64 (09/18 0401) SpO2:  [93 %-100 %] 97 % (09/18 0401) Weight:  [138 lb (62.6 kg)] 138 lb (62.6 kg) (09/17 1117)  Intake/Output from previous day: 09/17 0701 - 09/18 0700 In: 1410.8 [P.O.:490; I.V.:770.8; IV Piggyback:150] Out: 100 [Blood:100] Intake/Output this shift: No intake/output data recorded.  No results for input(s): HGB in the last 72 hours. No results for input(s): WBC, RBC, HCT, PLT in the last 72 hours. No results for input(s): NA, K, CL, CO2, BUN, CREATININE, GLUCOSE, CALCIUM in the last 72 hours. No results for input(s): LABPT, INR in the last 72 hours.  Neurologically intact Dg Lumbar Spine 2-3 Views  Result Date: 11/10/2016 CLINICAL DATA:  L4-5 decompression, preop EXAM: LUMBAR SPINE - 2-3 VIEW COMPARISON:  Lumbar spine MRI dated 08/06/2016 FINDINGS: Single lateral view of the lumbar spine demonstrates surgical probes at the L3-4 level and posterior to the L5 vertebral body. IMPRESSION: Lumbar localization, as above. Electronically Signed   By: Julian Hy M.D.   On: 11/10/2016 15:02    Assessment/Plan: 1 Day Post-Op Procedure(s) (LRB): L4-5 DECOMPRESSION FOR RECURRENT STENOSIS (N/A) Plan: discharge home office one week.   Marybelle Killings 11/11/2016, 7:42 AM

## 2016-11-11 NOTE — Care Management CC44 (Signed)
Condition Code 44 Documentation Completed  Patient Details  Name: Kristi Alexander MRN: [id number] Date of Birth: [birth year]   Condition Code 44 given:  Yes Patient signature on Condition Code 44 notice:  Yes Documentation of 2 MD's agreement:  Yes Code 44 added to claim:  Yes    Ninfa Meeker, RN 11/11/2016, 10:05 AM

## 2016-11-11 NOTE — Discharge Instructions (Signed)
OK to shower . Dry off and place dressing or large band aid over incision so it does not rub against your cloths.  Walk daily.  Avoid lifting. See Dr. Lorin Mercy in one week

## 2016-11-17 NOTE — Progress Notes (Signed)
Established Intermittent Claudication   History of Present Illness   ROBIE Alexander is a 83 y.o. ([birth year]) female who presents with chief complaint: back pain.  This patient was previously seen with abnormal ABI with atypical sx for intermittent claudication.  I felt the patient's sx were more consistent with spinal etiology for her sx.  She has subsequently undergone L4-5 decompression of a spinal stenosis (11/10/16) by Dr. Lorin Mercy.    The patient's symptoms have not progressed.  The patient's symptoms are: tightness in her feet.  The patient's treatment regimen currently included: maximal medical management.  Her walking has been limited by recovery from surgery  The patient's PMH, PSH, and SH, and FamHx are unchanged from 08/08/16.  Current Outpatient Prescriptions  Medication Sig Dispense Refill  . amLODipine (NORVASC) 10 MG tablet TAKE 1 TABLET BY MOUTH ONCE DAILY 30 tablet 3  . aspirin 81 MG tablet Take 81 mg by mouth daily.     Marland Kitchen b complex vitamins tablet Take 1 tablet by mouth daily.    . ciprofloxacin (CIPRO) 500 MG tablet Take 1 tablet (500 mg total) by mouth 2 (two) times daily. 10 tablet 0  . clobetasol ointment (TEMOVATE) 4.16 % Apply 1 application topically daily as needed (dermatitis).    Marland Kitchen HYDROcodone-acetaminophen (NORCO/VICODIN) 5-325 MG tablet Take 1 tablet by mouth every 6 (six) hours as needed (breakthrough pain). 30 tablet 0  . hydrOXYzine (ATARAX/VISTARIL) 25 MG tablet TAKE ONE TABLET BY MOUTH ONCE DAILY AS NEEDED (Patient taking differently: TAKE ONE TABLET BY MOUTH ONCE DAILY AS NEEDED ITCHING) 90 tablet 2  . isosorbide mononitrate (IMDUR) 30 MG 24 hr tablet TAKE ONE AND ONE-HALF TABLETS BY MOUTH ONCE DAILY 135 tablet 3  . losartan (COZAAR) 100 MG tablet Take 1 tablet (100 mg total) by mouth daily. (Patient not taking: Reported on 11/03/2016) 90 tablet 11  . losartan (COZAAR) 50 MG tablet Take 50 mg by mouth daily.    . metoprolol succinate (TOPROL-XL) 25 MG 24 hr  tablet TAKE ONE TABLET BY MOUTH ONCE DAILY 90 tablet 3  . pravastatin (PRAVACHOL) 40 MG tablet Take 1 tablet (40 mg total) by mouth every evening. (Patient not taking: Reported on 11/03/2016) 90 tablet 1  . Vitamin D, Ergocalciferol, (DRISDOL) 50000 units CAPS capsule Take 1 capsule (50,000 Units total) by mouth every 7 (seven) days. (Patient not taking: Reported on 11/03/2016) 12 capsule 0   No current facility-administered medications for this visit.     On ROS today: back pain from surgery, no intermittent claudication .   Physical Examination   Vitals:   11/21/16 0950  BP: (!) 171/86  Pulse: 65  SpO2: 100%  Weight: 138 lb (62.6 kg)  Height: 5\' 1"  (1.549 m)   Body mass index is 26.07 kg/m.  General Alert, O x 3, WD, NAD  Pulmonary Sym exp, good B air movt, CTA B  Cardiac RRR, Nl S1, S2, no Murmurs, No rubs, No S3,S4  Vascular Vessel Right Left  Radial Palpable Palpable  Brachial Palpable Palpable  Carotid Palpable, No Bruit Palpable, No Bruit  Aorta Not palpable N/A  Femoral Palpable Palpable  Popliteal Not palpable Not palpable  PT Not palpable Not palpable  DP Not palpable Not palpable    Gastro- intestinal soft, non-distended, non-tender to palpation, No guarding or rebound, no HSM, no masses, no CVAT B, No palpable prominent aortic pulse,    Musculo- skeletal M/S 5/5 throughout  , Extremities without ischemic changes  , No  edema present, No visible varicosities , No Lipodermatosclerosis present  Neurologic Pain and light touch intact in extremities , Motor exam as listed above    Non-Invasive Vascular imaging   ABI (11/21/2016)  R:   ABI: 0.73 (0.59),   PT: mono  DP: mono  TBI:  0.40  L:   ABI: 0.98 (0.93),   PT: mono  DP: mono  TBI: 0.48  Aortoiliac Duplex (11/21/2016)  Ao: 50 c/s  R iliac: 84-550 c/s  L iliac: 47-258 c/s   Medical Decision Making   Kristi Alexander is a 83 y.o. female who presents with:  S/p L4-5 spinal decompression,  RLE PAD in intermittent claudication range, bilateral iliac artery stenosis.   Pt clearly has inflow disease.  Unfortunately, the patient is recovering from back surgery, so I doubt she can tolerate laying on the carbon fiber angiogram.  Will repeat ABI and aortoiliac duplex in 3 months.  The result reproduce and patient has recovered from surgery, will proceed with Ao, bilateral pelvic angiogram and intervention.  I discussed in depth with the patient the nature of atherosclerosis, and emphasized the importance of maximal medical management including strict control of blood pressure, blood glucose, and lipid levels, antiplatelet agents, obtaining regular exercise, and cessation of smoking.    The patient is aware that without maximal medical management the underlying atherosclerotic disease process will progress, limiting the benefit of any interventions. The patient is currently on a statin: Pravachol.  The patient is currently on an anti-platelet: ASA.  Thank you for allowing [place] to participate in this patient's care.   Adele Barthel, MD, FACS [facility] Office: [phone] Pager: [phone]

## 2016-11-18 ENCOUNTER — Ambulatory Visit (INDEPENDENT_AMBULATORY_CARE_PROVIDER_SITE_OTHER): Payer: Medicare Other | Admitting: Orthopaedic Surgery

## 2016-11-18 ENCOUNTER — Encounter (INDEPENDENT_AMBULATORY_CARE_PROVIDER_SITE_OTHER): Payer: Self-pay | Admitting: Orthopaedic Surgery

## 2016-11-18 DIAGNOSIS — M7542 Impingement syndrome of left shoulder: Secondary | ICD-10-CM

## 2016-11-18 DIAGNOSIS — Z9889 Other specified postprocedural states: Secondary | ICD-10-CM

## 2016-11-18 MED ORDER — HYDROCODONE-ACETAMINOPHEN 5-325 MG PO TABS: 1.0000 | ORAL_TABLET | Freq: Four times a day (QID) | ORAL | 0 refills | Status: DC | PRN

## 2016-11-18 MED ORDER — METHYLPREDNISOLONE ACETATE 40 MG/ML IJ SUSP
40.0000 mg | INTRAMUSCULAR | Status: AC | PRN
  Administered 2016-11-18: 40 mg via INTRA_ARTICULAR

## 2016-11-18 MED ORDER — BUPIVACAINE HCL 0.25 % IJ SOLN
4.0000 mL | INTRAMUSCULAR | Status: AC | PRN
  Administered 2016-11-18: 4 mL via INTRA_ARTICULAR

## 2016-11-18 MED ORDER — LIDOCAINE HCL 1 % IJ SOLN
1.0000 mL | INTRAMUSCULAR | Status: AC | PRN
  Administered 2016-11-18: 1 mL

## 2016-11-18 NOTE — Progress Notes (Signed)
Office Visit Note   Patient: Kristi Alexander           Date of Birth: [birth year]           MRN: [id number] Visit Date: 11/18/2016              Requested by: Maryellen Pile, MD [address] [place], [place] [address] PCP: Maryellen Pile, MD   Assessment & Plan: Visit Diagnoses:  1. Status post lumbar spine operative procedure for decompression of spinal cord   2. Impingement syndrome of left shoulder     Plan: Steri-Strips changed lumbar incision was good she continue walking with her 4 pronged cane with that she's been using for over a year. Left shoulder is painful she had previous injection year ago requests repeat injection.  Follow-Up Instructions: Return in about 4 weeks (around 12/16/2016).   Orders:  Orders Placed This Encounter  Procedures  . Large Joint Injection/Arthrocentesis   Meds ordered this encounter  Medications  . HYDROcodone-acetaminophen (NORCO/VICODIN) 5-325 MG tablet    Sig: Take 1 tablet by mouth every 6 (six) hours as needed (breakthrough pain).    Dispense:  30 tablet    Refill:  0  . HYDROcodone-acetaminophen (NORCO/VICODIN) 5-325 MG tablet    Sig: Take 1 tablet by mouth every 6 (six) hours as needed (breakthrough pain).    Dispense:  20 tablet    Refill:  0      Procedures: Large Joint Inj Date/Time: 11/18/2016 11:31 AM Performed by: Marybelle Killings Authorized by: Rodell Perna C   Consent Given by:  Patient Site marked: the procedure site was marked   Indications:  Pain Location:  Shoulder Needle Size:  22 G Needle Length:  1.5 inches Ultrasound Guidance: No   Fluoroscopic Guidance: No   Arthrogram: No   Medications:  1 mL lidocaine 1 %; 40 mg methylPREDNISolone acetate 40 MG/ML; 4 mL bupivacaine 0.25 % Patient tolerance:  Patient tolerated the procedure well with no immediate complications     Clinical Data: No additional findings.   Subjective: Chief Complaint  Patient presents with  . Lower Back - Routine Post Op  .  Left Shoulder - Pain    HPI  Review of Systems   Objective: Vital Signs: There were no vitals taken for this visit.  Physical Exam  Ortho Exam  Specialty Comments:  No specialty comments available.  Imaging: No results found.   PMFS History: Patient Active Problem List   Diagnosis Date Noted  . Lumbar stenosis 11/10/2016  . Atherosclerosis of native artery of both lower extremities with intermittent claudication ([place]) 08/08/2016  . Peripheral arterial disease ([place]) 08/04/2016  . Leg cramps 06/20/2016  . Vitamin D deficiency 11/28/2015  . Left hip pain 11/28/2015  . Osteoporosis 09/16/2015  . Pain in joint, shoulder region 09/16/2015  . Estrogen deficiency 03/14/2015  . Spinal stenosis of lumbar region 01/24/2015  . Myalgia 01/11/2015  . Preventative health care 12/11/2014  . Essential hypertension 12/11/2014  . Pulmonary hypertension ([place])   . Chronic cough   . Chronic diastolic heart failure ([place]) 05/15/2013  . Diabetes ([place]) 05/14/2013  . Other vitamin B12 deficiency anemia 07/13/2012  . Restless legs syndrome (RLS) 07/13/2012   Past Medical History:  Diagnosis Date  . Abnormality of gait 11/22/2014  . Arthritis   . Cyst of breast, right, benign solitary   . Dyslipidemia   . H/O: hysterectomy   . Heart murmur   . History of appendectomy   .  History of lumbosacral spine surgery   . Hypertension   . Inguinal hernia   . Lumbar stenosis   . Restless leg syndrome     Family History  Problem Relation Age of Onset  . Heart attack Father   . Lung cancer Brother   . Healthy Sister     Past Surgical History:  Procedure Laterality Date  . ABDOMINAL HYSTERECTOMY    . APPENDECTOMY    . BOWEL RESECTION N/A 03/23/2013   Procedure: SMALL BOWEL RESECTION;  Surgeon: Gwenyth Ober, MD;  Location: [facility];  Service: General;  Laterality: N/A;  . BREAST CYST EXCISION     LEFT  . ESOPHAGOGASTRODUODENOSCOPY N/A 04/01/2013   Procedure: ESOPHAGOGASTRODUODENOSCOPY (EGD);   Surgeon: Gwenyth Ober, MD;  Location: [facility];  Service: General;  Laterality: N/A;  . HERNIA REPAIR    . LAPAROTOMY N/A 03/23/2013   Procedure: EXPLORATORY LAPAROTOMY;  Surgeon: Gwenyth Ober, MD;  Location: [facility];  Service: General;  Laterality: N/A;  . LUMBAR LAMINECTOMY/DECOMPRESSION MICRODISCECTOMY N/A 11/10/2016   Procedure: L4-5 DECOMPRESSION FOR RECURRENT STENOSIS;  Surgeon: Marybelle Killings, MD;  Location: [facility];  Service: Orthopedics;  Laterality: N/A;  . PEG PLACEMENT N/A 04/01/2013   Procedure: PERCUTANEOUS ENDOSCOPIC GASTROSTOMY (PEG) PLACEMENT;  Surgeon: Gwenyth Ober, MD;  Location: [facility];  Service: General;  Laterality: N/A;  . TRACHEOSTOMY     feinstein   Social History   Occupational History  . retired    Social History Main Topics  . Smoking status: Former Smoker    Packs/day: 0.00    Types: Cigarettes    Quit date: 02/25/2003  . Smokeless tobacco: Never Used  . Alcohol use No     Comment: socailly.  . Drug use: No  . Sexual activity: Not on file

## 2016-11-20 NOTE — Progress Notes (Signed)
PT evaluation addendum Late entry for 12-09-16 G-codes    12/09/16 1600  PT Time Calculation  PT Start Time (ACUTE ONLY) 1613  PT Stop Time (ACUTE ONLY) 1633  PT Time Calculation (min) (ACUTE ONLY) 20 min  PT G-Codes **NOT FOR INPATIENT CLASS**  Functional Assessment Tool Used AM-PAC 6 Clicks Basic Mobility  Functional Limitation Mobility: Walking and moving around  Mobility: Walking and Moving Around Current Status (P7106) CK  Mobility: Walking and Moving Around Goal Status (Y6948) CK  Mobility: Walking and Moving Around Discharge Status (N4627) CK  PT General Charges  $$ ACUTE PT VISIT 1 Visit  PT Evaluation  $PT Eval Low Complexity 1 Low  11/20/2016 [place], [place] [phone]

## 2016-11-21 ENCOUNTER — Ambulatory Visit (INDEPENDENT_AMBULATORY_CARE_PROVIDER_SITE_OTHER)
Admission: RE | Admit: 2016-11-21 | Discharge: 2016-11-21 | Disposition: A | Discharge status: Home / Self Care | Payer: Medicare Other | Source: Ambulatory Visit | Attending: Vascular Surgery | Admitting: Vascular Surgery

## 2016-11-21 ENCOUNTER — Ambulatory Visit (INDEPENDENT_AMBULATORY_CARE_PROVIDER_SITE_OTHER): Payer: Medicare Other | Admitting: Vascular Surgery

## 2016-11-21 ENCOUNTER — Ambulatory Visit (HOSPITAL_COMMUNITY)
Admission: RE | Admit: 2016-11-21 | Discharge: 2016-11-21 | Disposition: A | Discharge status: Home / Self Care | Payer: Medicare Other | Source: Ambulatory Visit | Attending: Vascular Surgery | Admitting: Vascular Surgery

## 2016-11-21 ENCOUNTER — Encounter: Payer: Self-pay | Admitting: Vascular Surgery

## 2016-11-21 VITALS — BP 171/86 | HR 65 | Ht 61.0 in | Wt 138.0 lb

## 2016-11-21 DIAGNOSIS — M48062 Spinal stenosis, lumbar region with neurogenic claudication: Secondary | ICD-10-CM

## 2016-11-21 DIAGNOSIS — I771 Stricture of artery: Secondary | ICD-10-CM | POA: Insufficient documentation

## 2016-11-21 DIAGNOSIS — I70213 Atherosclerosis of native arteries of extremities with intermittent claudication, bilateral legs: Secondary | ICD-10-CM | POA: Insufficient documentation

## 2016-11-25 NOTE — Addendum Note (Signed)
Addended by: Lianne Cure A on: 11/25/2016 03:11 PM   Modules accepted: Orders

## 2016-12-16 ENCOUNTER — Ambulatory Visit (INDEPENDENT_AMBULATORY_CARE_PROVIDER_SITE_OTHER): Payer: Medicare Other | Admitting: Orthopaedic Surgery

## 2016-12-16 ENCOUNTER — Encounter (INDEPENDENT_AMBULATORY_CARE_PROVIDER_SITE_OTHER): Payer: Self-pay | Admitting: Orthopaedic Surgery

## 2016-12-16 VITALS — BP 160/74 | HR 74

## 2016-12-16 DIAGNOSIS — M48061 Spinal stenosis, lumbar region without neurogenic claudication: Secondary | ICD-10-CM

## 2016-12-16 DIAGNOSIS — I70213 Atherosclerosis of native arteries of extremities with intermittent claudication, bilateral legs: Secondary | ICD-10-CM

## 2016-12-16 NOTE — Progress Notes (Signed)
Post-Op Visit Note   Patient: Kristi Alexander           Date of Birth: 1925-08-04           MRN: [id number] Visit Date: 12/16/2016 PCP: Maryellen Pile, MD   Assessment & Plan: Patient returns post decompression first the recurrent stenosis at L4-5 level. She still using her 4 point cane but can walk in the house without it. She still notes that she somewhat unsteady on her feet she has some arterial narrowing with 0.6 on the right and 0.9 on the left.  We'll set patient up for some physical therapy I'll recheck her again in 6 weeks.  Chief Complaint: Patient states she still continues to have problems from the waist down she feels like her legs are stiff tight not really supportive not really strong. She has problems when she changes position sometimes at night it bothers her. She slow getting from sitting to standing she's been ambulatory  with her 4 pronged cane primarily. Chief Complaint  Patient presents with  . Lower Back - Routine Post Op  . Left Shoulder - Follow-up   Visit Diagnoses:  1. Spinal stenosis of lumbar region, unspecified whether neurogenic claudication present   2. Atherosclerosis of native artery of both lower extremities with intermittent claudication [facility])     Plan:  Physical therapy for lower extremity strengthening. She has some PAD and generalized weakness related to her previous spinal stenosis. Recheck 6 weeks to check her therapy progress.  Follow-Up Instructions: Return in about 6 weeks (around 01/27/2017).   Orders:  Orders Placed This Encounter  Procedures  . Ambulatory referral to Physical Therapy   No orders of the defined types were placed in this encounter.   Imaging: No results found.  PMFS History: Patient Active Problem List   Diagnosis Date Noted  . Lumbar stenosis 11/10/2016  . Atherosclerosis of native artery of both lower extremities with intermittent claudication ([place]) 08/08/2016  . Peripheral arterial disease ([place]) 08/04/2016    . Leg cramps 06/20/2016  . Vitamin D deficiency 11/28/2015  . Left hip pain 11/28/2015  . Osteoporosis 09/16/2015  . Pain in joint, shoulder region 09/16/2015  . Estrogen deficiency 03/14/2015  . Spinal stenosis of lumbar region 01/24/2015  . Myalgia 01/11/2015  . Preventative health care 12/11/2014  . Essential hypertension 12/11/2014  . Pulmonary hypertension ([place])   . Chronic cough   . Chronic diastolic heart failure ([place]) 05/15/2013  . Diabetes ([place]) 05/14/2013  . Other vitamin B12 deficiency anemia 07/13/2012  . Restless legs syndrome (RLS) 07/13/2012   Past Medical History:  Diagnosis Date  . Abnormality of gait 11/22/2014  . Arthritis   . Cyst of breast, right, benign solitary   . Dyslipidemia   . H/O: hysterectomy   . Heart murmur   . History of appendectomy   . History of lumbosacral spine surgery   . Hypertension   . Inguinal hernia   . Lumbar stenosis   . Restless leg syndrome     Family History  Problem Relation Age of Onset  . Heart attack Father   . Lung cancer Brother   . Healthy Sister     Past Surgical History:  Procedure Laterality Date  . ABDOMINAL HYSTERECTOMY    . APPENDECTOMY    . BOWEL RESECTION N/A 03/23/2013   Procedure: SMALL BOWEL RESECTION;  Surgeon: Gwenyth Ober, MD;  Location: [facility];  Service: General;  Laterality: N/A;  . BREAST CYST EXCISION  LEFT  . ESOPHAGOGASTRODUODENOSCOPY N/A 04/01/2013   Procedure: ESOPHAGOGASTRODUODENOSCOPY (EGD);  Surgeon: Gwenyth Ober, MD;  Location: [employer] [facility];  Service: General;  Laterality: N/A;  . HERNIA REPAIR    . LAPAROTOMY N/A 03/23/2013   Procedure: EXPLORATORY LAPAROTOMY;  Surgeon: Gwenyth Ober, MD;  Location: [facility];  Service: General;  Laterality: N/A;  . LUMBAR LAMINECTOMY/DECOMPRESSION MICRODISCECTOMY N/A 11/10/2016   Procedure: L4-5 DECOMPRESSION FOR RECURRENT STENOSIS;  Surgeon: Marybelle Killings, MD;  Location: [facility];  Service: Orthopedics;  Laterality: N/A;  . PEG PLACEMENT N/A 04/01/2013    Procedure: PERCUTANEOUS ENDOSCOPIC GASTROSTOMY (PEG) PLACEMENT;  Surgeon: Gwenyth Ober, MD;  Location: [facility];  Service: General;  Laterality: N/A;  . TRACHEOSTOMY     feinstein   Social History   Occupational History  . retired    Social History Main Topics  . Smoking status: Former Smoker    Packs/day: 0.00    Types: Cigarettes    Quit date: 02/25/2003  . Smokeless tobacco: Never Used  . Alcohol use No     Comment: socailly.  . Drug use: No  . Sexual activity: Not on file

## 2016-12-23 ENCOUNTER — Ambulatory Visit: Payer: Medicare Other | Attending: Orthopaedic Surgery | Admitting: Physical Therapy

## 2016-12-23 ENCOUNTER — Encounter: Payer: Self-pay | Admitting: Physical Therapy

## 2016-12-23 DIAGNOSIS — M791 Myalgia, unspecified site: Secondary | ICD-10-CM | POA: Diagnosis present

## 2016-12-23 DIAGNOSIS — G8929 Other chronic pain: Secondary | ICD-10-CM | POA: Insufficient documentation

## 2016-12-23 DIAGNOSIS — M5441 Lumbago with sciatica, right side: Secondary | ICD-10-CM | POA: Diagnosis present

## 2016-12-23 DIAGNOSIS — R2689 Other abnormalities of gait and mobility: Secondary | ICD-10-CM | POA: Diagnosis present

## 2016-12-23 DIAGNOSIS — M6281 Muscle weakness (generalized): Secondary | ICD-10-CM | POA: Diagnosis present

## 2016-12-23 NOTE — Therapy (Signed)
[place], [place], [address] Phone: [phone]   Fax:  [phone]  Physical Therapy Evaluation  Patient Details  Name: Kristi Alexander MRN: [id number] Date of Birth: [birth year] Referring Provider: Lorin Mercy  Encounter Date: 12/23/2016      PT End of Session - 12/23/16 1129    Visit Number 1   Number of Visits 12   Date for PT Re-Evaluation 02/03/17   PT Start Time 1019   PT Stop Time 1110   PT Time Calculation (min) 51 min   Activity Tolerance Patient tolerated treatment well   Behavior During Therapy [facility] for tasks assessed/performed      Past Medical History:  Diagnosis Date  . Abnormality of gait 11/22/2014  . Arthritis   . Cyst of breast, right, benign solitary   . Dyslipidemia   . H/O: hysterectomy   . Heart murmur   . History of appendectomy   . History of lumbosacral spine surgery   . Hypertension   . Inguinal hernia   . Lumbar stenosis   . Restless leg syndrome     Past Surgical History:  Procedure Laterality Date  . ABDOMINAL HYSTERECTOMY    . APPENDECTOMY    . BOWEL RESECTION N/A 03/23/2013   Procedure: SMALL BOWEL RESECTION;  Surgeon: Gwenyth Ober, MD;  Location: [place];  Service: General;  Laterality: N/A;  . BREAST CYST EXCISION     LEFT  . ESOPHAGOGASTRODUODENOSCOPY N/A 04/01/2013   Procedure: ESOPHAGOGASTRODUODENOSCOPY (EGD);  Surgeon: Gwenyth Ober, MD;  Location: [place];  Service: General;  Laterality: N/A;  . HERNIA REPAIR    . LAPAROTOMY N/A 03/23/2013   Procedure: EXPLORATORY LAPAROTOMY;  Surgeon: Gwenyth Ober, MD;  Location: [place];  Service: General;  Laterality: N/A;  . LUMBAR LAMINECTOMY/DECOMPRESSION MICRODISCECTOMY N/A 11/10/2016   Procedure: L4-5 DECOMPRESSION FOR RECURRENT STENOSIS;  Surgeon: Marybelle Killings, MD;  Location: [place];  Service: Orthopedics;  Laterality: N/A;  . PEG PLACEMENT N/A 04/01/2013   Procedure: PERCUTANEOUS ENDOSCOPIC GASTROSTOMY (PEG)  PLACEMENT;  Surgeon: Gwenyth Ober, MD;  Location: [place];  Service: General;  Laterality: N/A;  . TRACHEOSTOMY     feinstein    There were no vitals filed for this visit.       Subjective Assessment - 12/23/16 1023    Subjective Patient presents following lumbar decompression surgery with main complaints of stiffness in toes, legs .  Rt side is more symptomatic than Lt. She denies back pain but has LE pain from hips to feet.  Gets some numbness in foot R.  She reports weakness in both legs can't feel like her legs will support her upper body and she lacks energy.     Patient is accompained by: Family member  daughter    Pertinent History Per pt, 2011 spinal surgery and  most recent 11/10/16 spinal decompression and microdiscectomy   Limitations Standing;Walking;Sitting;House hold activities;Other (comment);Lifting  sleeping   How long can you sit comfortably? 1-2 hours    How long can you stand comfortably? >45-60 min    How long can you walk comfortably? 1 block or so every day   Diagnostic tests Presurgical MRI 07/2016   Patient Stated Goals Pt would like to stay as active as she can , drive, dance    Currently in Pain? Yes   Pain Score 0-No pain   Pain Location Back  low lumbar     Pain Orientation Right;Left   Pain Descriptors /  Indicators Tightness;Discomfort;Aching   Pain Type Surgical pain;Chronic pain   Pain Radiating Towards hips to feet bilateral    Pain Onset More than a month ago   Pain Frequency Intermittent   Aggravating Factors  sitting still and walking too much   Pain Relieving Factors repositioning and exercise    Effect of Pain on Daily Activities limits activity            [facility] PT Assessment - 12/23/16 0001      Assessment   Medical Diagnosis Lumbar decompression    Referring Provider Yates   Onset Date/Surgical Date 11/10/16   Next MD Visit unknown   Prior Therapy Yes post PT previous surgery     Precautions   Precautions Back    Precaution Comments given after surgery     Restrictions   Weight Bearing Restrictions No     Balance Screen   Has the patient fallen in the past 6 months No   Has the patient had a decrease in activity level because of a fear of falling?  Yes   Is the patient reluctant to leave their home because of a fear of falling?  No     Home Environment   Living Environment Private residence   Living Arrangements Children   Type of [place] to enter   Entrance Stairs-Number of Steps 3   Entrance Stairs-Rails Can reach both   [place] One level     Prior Function   Level of Independence Independent with basic ADLs;Independent with household mobility without device;Independent with community mobility with device   Vocation Retired   Leisure active, dancing, family and friends     Cognition   Overall Cognitive Status Within Functional Limits for tasks assessed     Observation/Other Assessments   Focus on Therapeutic Outcomes (FOTO)  54%     Sensation   Light Touch Appears Intact     Functional Tests   Functional tests Squat     Squat   Comments mini squat no pain      Sit to Stand   Comments [facility]     Posture/Postural Control   Posture/Postural Control Postural limitations   Postural Limitations Flexed trunk   Posture Comments genu valgus      AROM   Lumbar Flexion NT due to surgery    Lumbar Extension min discomfort limited   Lumbar - Right Side Bend flexed, 50%   Lumbar - Left Side Bend flexed, 50%   Lumbar - Right Rotation 25%    Lumbar - Left Rotation 25%     Strength   Right Hip Flexion 3+/5   Right Hip ABduction 3+/5   Left Hip Flexion 3+/5   Left Hip ABduction 3+/5   Right Knee Flexion 4+/5   Right Knee Extension 4+/5   Left Knee Flexion 4/5   Left Knee Extension 5/5   Right Ankle Dorsiflexion 4+/5   Left Ankle Dorsiflexion 4/5     Flexibility   Hamstrings tight, 50 deg approx.      Palpation   Palpation comment tender Rt.  lumbar and into gluteals, lateral hip at [place].      Bed Mobility   Bed Mobility --  WFL with some effort      Transfers   Transfers Sit to Stand;Supine to Sit   Sit to Stand 6: Modified independent (Device/Increase time)   Supine to Sit 6: Modified independent (Device/Increase time)  Objective measurements completed on examination: See above findings.          [place] Adult PT Treatment/Exercise - 12/23/16 0001      Lumbar Exercises: Stretches   Single Knee to Chest Stretch 2 reps;30 seconds   Lower Trunk Rotation 5 reps;10 seconds   Pelvic Tilt 5 reps;10 seconds                PT Education - 12/23/16 2133    Education provided Yes   Education Details PT/POC, HEP, Eval findings, back precautions, nerve pain    Person(s) Educated Patient   Methods Explanation;Verbal cues;Handout   Comprehension Verbalized understanding;Verbal cues required;Need further instruction          PT Short Term Goals - 12/23/16 2133      PT SHORT TERM GOAL #1   Title Pt will be I with initial HEP    Time 3   Period Weeks   Status New   Target Date 01/13/17     PT SHORT TERM GOAL #2   Title Pt will report improved flexibility in trunk and hips fo greater ease of ADLs   Time 3   Period Weeks   Status New   Target Date 01/13/17     PT SHORT TERM GOAL #3   Title Pt will complete balance screen and set goal, if appropriate   Time 3   Period Weeks   Status New   Target Date 01/13/17           PT Long Term Goals - 12/23/16 2138      PT LONG TERM GOAL #1   Title Pt will be I with HEP as of last visit for continued strength and flexibiility.    Time 6   Period Weeks   Status New   Target Date 02/03/17     PT LONG TERM GOAL #2   Title Pt will understand posture and body mechanics to avoid injury, flare up.    Time 6   Period Weeks   Status New   Target Date 02/03/17     PT LONG TERM GOAL #3   Title Pt will be able to improve balance , TBA    Time 6   Period Weeks   Status New   Target Date 02/03/17     PT LONG TERM GOAL #4   Title Pt will be able to dorsiflex Rt. ankle fully (+10 deg) for safe driving as allowed by MD.    Baseline to neutral, pt does not feel confident   Time 6   Period Weeks   Status New   Target Date 02/03/17     PT LONG TERM GOAL #5   Title Pt will demo 4+/5 strength in hip abduction for improved gait and stability.    Time 6   Period Weeks   Status New   Target Date 02/03/17                Plan - 12/23/16 2144    Clinical Impression Statement This patient presents for low complexity eval of residual pain and LE symtpoms following spinal decompression and microdiscectomy L4-L5.  She has mostly Rt hip/LE pain and a sensation of tightness in her legs (thighs and calves), lack of ankle ROM.  She has vague sensory deficits in Rt LE.  She has good hip ROM but strength is 3+/5.  She will do well with 4-6 weeks of PT to provide exercise instruction and education for safe mobility,  including balance screen.    Clinical Presentation Stable   Clinical Decision Making Low   Rehab Potential Excellent   PT Frequency 2x / week   PT Duration 6 weeks   PT Treatment/Interventions ADLs/Self Care Home Management;Cryotherapy;Electrical Stimulation;Moist Heat;Ultrasound;Passive range of motion;Neuromuscular re-education;Balance training;Therapeutic exercise;Therapeutic activities;Manual techniques;Functional mobility training;Patient/family education;DME Instruction;Gait training   PT Next Visit Plan balance screen, check HEP, MHP, begin posture and body mech    PT Home Exercise Plan basic back    Consulted and Agree with Plan of Care Patient      Patient will benefit from skilled therapeutic intervention in order to improve the following deficits and impairments:  Decreased balance, Decreased range of motion, Impaired flexibility, Postural dysfunction, Difficulty walking, Decreased strength, Increased  fascial restricitons, Pain, Decreased endurance, Impaired sensation  Visit Diagnosis: Chronic bilateral low back pain with right-sided sciatica      G-Codes - 29-Dec-2016 2154    Functional Assessment Tool Used (Outpatient Only) FOTO   Functional Limitation Mobility: Walking and moving around   Mobility: Walking and Moving Around Current Status [phone]) At least 40 percent but less than 60 percent impaired, limited or restricted   Mobility: Walking and Moving Around Goal Status [phone]) At least 20 percent but less than 40 percent impaired, limited or restricted       Problem List Patient Active Problem List   Diagnosis Date Noted  . Lumbar stenosis 11/10/2016  . Atherosclerosis of native artery of both lower extremities with intermittent claudication ([place]) 08/08/2016  . Peripheral arterial disease ([place]) 08/04/2016  . Leg cramps 06/20/2016  . Vitamin D deficiency 11/28/2015  . Left hip pain 11/28/2015  . Osteoporosis 09/16/2015  . Pain in joint, shoulder region 09/16/2015  . Estrogen deficiency 03/14/2015  . Spinal stenosis of lumbar region 01/24/2015  . Myalgia 01/11/2015  . Preventative health care 12/11/2014  . Essential hypertension 12/11/2014  . Pulmonary hypertension ([place])   . Chronic cough   . Chronic diastolic heart failure ([place]) 05/15/2013  . Diabetes ([place]) 05/14/2013  . Other vitamin B12 deficiency anemia 07/13/2012  . Restless legs syndrome (RLS) 07/13/2012    PAA,JENNIFER 12-29-2016, 9:59 PM  [facility] [address] [place], [place], [address] Phone: [phone]   Fax:  [phone]  Name: Kristi Alexander MRN: [id number] Date of Birth: Aug 08, 1925   Raeford Razor, PT Dec 29, 2016 10:00 PM Phone: [phone] Fax: [phone]

## 2016-12-25 ENCOUNTER — Ambulatory Visit: Payer: Medicare Other | Attending: Orthopaedic Surgery | Admitting: Physical Therapy

## 2016-12-25 ENCOUNTER — Encounter: Payer: Self-pay | Admitting: Physical Therapy

## 2016-12-25 DIAGNOSIS — M6281 Muscle weakness (generalized): Secondary | ICD-10-CM | POA: Diagnosis present

## 2016-12-25 DIAGNOSIS — M5441 Lumbago with sciatica, right side: Secondary | ICD-10-CM | POA: Diagnosis present

## 2016-12-25 DIAGNOSIS — R2689 Other abnormalities of gait and mobility: Secondary | ICD-10-CM | POA: Diagnosis present

## 2016-12-25 DIAGNOSIS — M791 Myalgia, unspecified site: Secondary | ICD-10-CM | POA: Diagnosis present

## 2016-12-25 DIAGNOSIS — G8929 Other chronic pain: Secondary | ICD-10-CM | POA: Diagnosis present

## 2016-12-25 NOTE — Therapy (Signed)
[place], [place], [address] Phone: [phone]   Fax:  [phone]  Physical Therapy Treatment  Patient Details  Name: Kristi Alexander MRN: [id number] Date of Birth: 1925-08-15 Referring Provider: Lorin Mercy  Encounter Date: 12/25/2016      PT End of Session - 12/25/16 1504    Visit Number 2   Number of Visits 12   PT Start Time 1332   PT Stop Time 1416   PT Time Calculation (min) 44 min   Activity Tolerance Patient tolerated treatment well   Behavior During Therapy [facility] for tasks assessed/performed      Past Medical History:  Diagnosis Date  . Abnormality of gait 11/22/2014  . Arthritis   . Cyst of breast, right, benign solitary   . Dyslipidemia   . H/O: hysterectomy   . Heart murmur   . History of appendectomy   . History of lumbosacral spine surgery   . Hypertension   . Inguinal hernia   . Lumbar stenosis   . Restless leg syndrome     Past Surgical History:  Procedure Laterality Date  . ABDOMINAL HYSTERECTOMY    . APPENDECTOMY    . BOWEL RESECTION N/A 03/23/2013   Procedure: SMALL BOWEL RESECTION;  Surgeon: Gwenyth Ober, MD;  Location: [place];  Service: General;  Laterality: N/A;  . BREAST CYST EXCISION     LEFT  . ESOPHAGOGASTRODUODENOSCOPY N/A 04/01/2013   Procedure: ESOPHAGOGASTRODUODENOSCOPY (EGD);  Surgeon: Gwenyth Ober, MD;  Location: [place];  Service: General;  Laterality: N/A;  . HERNIA REPAIR    . LAPAROTOMY N/A 03/23/2013   Procedure: EXPLORATORY LAPAROTOMY;  Surgeon: Gwenyth Ober, MD;  Location: [place];  Service: General;  Laterality: N/A;  . LUMBAR LAMINECTOMY/DECOMPRESSION MICRODISCECTOMY N/A 11/10/2016   Procedure: L4-5 DECOMPRESSION FOR RECURRENT STENOSIS;  Surgeon: Marybelle Killings, MD;  Location: [place];  Service: Orthopedics;  Laterality: N/A;  . PEG PLACEMENT N/A 04/01/2013   Procedure: PERCUTANEOUS ENDOSCOPIC GASTROSTOMY (PEG) PLACEMENT;  Surgeon: Gwenyth Ober, MD;   Location: [place];  Service: General;  Laterality: N/A;  . TRACHEOSTOMY     feinstein    There were no vitals filed for this visit.      Subjective Assessment - 12/25/16 1409    Subjective No pain.  I did my exercises   Patient is accompained by: Family member  Kristi Alexander daughter   Currently in Pain? No/denies            [facility] PT Assessment - 12/25/16 0001      Standardized Balance Assessment   Standardized Balance Assessment Berg Balance Test                     [facility] Adult PT Treatment/Exercise - 12/25/16 0001      Berg Balance Test   Sit to Stand Able to stand without using hands and stabilize independently   Standing Unsupported Able to stand safely 2 minutes   Sitting with Back Unsupported but Feet Supported on Floor or Stool Able to sit safely and securely 2 minutes   Stand to Sit Sits safely with minimal use of hands   Transfers Able to transfer safely, minor use of hands   Standing Unsupported with Eyes Closed Able to stand 10 seconds safely   Standing Ubsupported with Feet Together Able to place feet together independently and stand 1 minute safely   From Standing, Reach Forward with Outstretched Arm Can reach forward >5  cm safely (2")   From Standing Position, Pick up Object from [place] to pick up shoe safely and easily   From Standing Position, Turn to Look Behind Over each Shoulder Looks behind from both sides and weight shifts well   Turn 360 Degrees Needs assistance while turning   Standing Unsupported, Alternately Place Feet on Step/Stool Needs assistance to keep from falling or unable to try   Standing Unsupported, One Foot in [place] help to step but can hold 15 seconds   Standing on One Leg Tries to lift leg/unable to hold 3 seconds but remains standing independently   Total Score 40     Lumbar Exercises: Stretches   Single Knee to Chest Stretch 5 reps   Single Knee to Chest Stretch Limitations cues duration,    Lower Trunk  Rotation --  10 X 10 seconds,  cued for holding longer,  moving slower   Pelvic Tilt 5 reps  30 second holds   Pelvic Tilt Limitations cues   Piriformis Stretch 3 reps;30 seconds  cues                PT Education - 12/25/16 1503    Education provided Yes   Education Details HEP instruction   Person(s) Educated Patient;Child(ren)   Methods Explanation;Tactile cues   Comprehension Verbalized understanding;Returned demonstration          PT Short Term Goals - 12/23/16 2133      PT SHORT TERM GOAL #1   Title Pt will be I with initial HEP    Time 3   Period Weeks   Status New   Target Date 01/13/17     PT SHORT TERM GOAL #2   Title Pt will report improved flexibility in trunk and hips fo greater ease of ADLs   Time 3   Period Weeks   Status New   Target Date 01/13/17     PT SHORT TERM GOAL #3   Title Pt will complete balance screen and set goal, if appropriate   Time 3   Period Weeks   Status New   Target Date 01/13/17           PT Long Term Goals - 12/23/16 2138      PT LONG TERM GOAL #1   Title Pt will be I with HEP as of last visit for continued strength and flexibiility.    Time 6   Period Weeks   Status New   Target Date 02/03/17     PT LONG TERM GOAL #2   Title Pt will understand posture and body mechanics to avoid injury, flare up.    Time 6   Period Weeks   Status New   Target Date 02/03/17     PT LONG TERM GOAL #3   Title Pt will be able to improve balance , TBA   Time 6   Period Weeks   Status New   Target Date 02/03/17     PT LONG TERM GOAL #4   Title Pt will be able to dorsiflex Rt. ankle fully (+10 deg) for safe driving as allowed by MD.    Baseline to neutral, pt does not feel confident   Time 6   Period Weeks   Status New   Target Date 02/03/17     PT LONG TERM GOAL #5   Title Pt will demo 4+/5 strength in hip abduction for improved gait and stability.    Time 6   Period  Weeks   Status New   Target Date 02/03/17                Plan - 12/25/16 1504    Clinical Impression Statement [id number].  She needed a rest  halfway through. .  Moderate cues needed with HEP.  Patient was fatigued the whole session.  I worked too much this morning.  i did not sleep well.  No pain during session.   PT Next Visit Plan balance screen, results to reviewed by PT,  check HEP, MHP, begin posture and body mech    PT Home Exercise Plan basic back    Consulted and Agree with Plan of Care Patient   Family Member Consulted Daughter Kristi Alexander      Patient will benefit from skilled therapeutic intervention in order to improve the following deficits and impairments:     Visit Diagnosis: Chronic bilateral low back pain with right-sided sciatica  Muscle weakness (generalized)  Myalgia  Other abnormalities of gait and mobility     Problem List Patient Active Problem List   Diagnosis Date Noted  . Lumbar stenosis 11/10/2016  . Atherosclerosis of native artery of both lower extremities with intermittent claudication ([place]) 08/08/2016  . Peripheral arterial disease ([place]) 08/04/2016  . Leg cramps 06/20/2016  . Vitamin D deficiency 11/28/2015  . Left hip pain 11/28/2015  . Osteoporosis 09/16/2015  . Pain in joint, shoulder region 09/16/2015  . Estrogen deficiency 03/14/2015  . Spinal stenosis of lumbar region 01/24/2015  . Myalgia 01/11/2015  . Preventative health care 12/11/2014  . Essential hypertension 12/11/2014  . Pulmonary hypertension ([place])   . Chronic cough   . Chronic diastolic heart failure ([place]) 05/15/2013  . Diabetes ([place]) 05/14/2013  . Other vitamin B12 deficiency anemia 07/13/2012  . Restless legs syndrome (RLS) 07/13/2012    HARRIS,KAREN PTA 12/25/2016, 3:07 PM  [facility] [address] [place], [place], [address] Phone: [phone]   Fax:  [phone]  Name: Kristi Alexander MRN: [id number] Date of Birth: May 05, 1925

## 2016-12-30 ENCOUNTER — Ambulatory Visit: Payer: Medicare Other | Admitting: Physical Therapy

## 2016-12-30 ENCOUNTER — Encounter: Payer: Self-pay | Admitting: Physical Therapy

## 2016-12-30 DIAGNOSIS — M791 Myalgia, unspecified site: Secondary | ICD-10-CM

## 2016-12-30 DIAGNOSIS — M5441 Lumbago with sciatica, right side: Principal | ICD-10-CM

## 2016-12-30 DIAGNOSIS — R2689 Other abnormalities of gait and mobility: Secondary | ICD-10-CM

## 2016-12-30 DIAGNOSIS — G8929 Other chronic pain: Secondary | ICD-10-CM

## 2016-12-30 DIAGNOSIS — M6281 Muscle weakness (generalized): Secondary | ICD-10-CM

## 2016-12-30 NOTE — Therapy (Signed)
[place], [place], [address] Phone: [phone]   Fax:  [phone]  Physical Therapy Treatment  Patient Details  Name: Kristi Alexander MRN: [id number] Date of Birth: 06-Jan-1926 Referring Provider: Lorin Mercy   Encounter Date: 12/30/2016  PT End of Session - 12/30/16 1132    Visit Number  3    Number of Visits  12    Date for PT Re-Evaluation  02/03/17    PT Start Time  1108    PT Stop Time  1156    PT Time Calculation (min)  48 min    Activity Tolerance  Patient tolerated treatment well    Behavior During Therapy  [facility] for tasks assessed/performed       Past Medical History:  Diagnosis Date  . Abnormality of gait 11/22/2014  . Arthritis   . Cyst of breast, right, benign solitary   . Dyslipidemia   . H/O: hysterectomy   . Heart murmur   . History of appendectomy   . History of lumbosacral spine surgery   . Hypertension   . Inguinal hernia   . Lumbar stenosis   . Restless leg syndrome     Past Surgical History:  Procedure Laterality Date  . ABDOMINAL HYSTERECTOMY    . APPENDECTOMY    . BREAST CYST EXCISION     LEFT  . HERNIA REPAIR    . TRACHEOSTOMY     feinstein    There were no vitals filed for this visit.  Subjective Assessment - 12/30/16 1111    Subjective  Legs have been swollen more since last visit.  Even the L foot.  No pain.  Was standing alot this past weekend too as it was my home coming weekend.  I didnt get to do my exercises like i wanted to.     Currently in Pain?  No/denies        [facility] Adult PT Treatment/Exercise - 12/30/16 0001      Lumbar Exercises: Stretches   Active Hamstring Stretch  2 reps;30 seconds    Single Knee to Chest Stretch  3 reps;30 seconds    Lower Trunk Rotation  5 reps;10 seconds    Pelvic Tilt  5 reps;10 seconds      Lumbar Exercises: Seated   Sit to Stand  10 reps    Other Seated Lumbar Exercises  march and clam with green band x 10 each     Other Seated Lumbar Exercises  hamstring curl green x 10       Lumbar Exercises: Supine   Clam  10 reps max cues to avoid ant tilt    max cues to avoid ant tilt    Bridge  10 reps      Modalities   Modalities  Moist Heat      Moist Heat Therapy   Number Minutes Moist Heat  10 Minutes    Moist Heat Location  Lumbar Spine               PT Short Term Goals - 12/30/16 1133      PT SHORT TERM GOAL #1   Title  Pt will be I with initial HEP     Status  On-going      PT SHORT TERM GOAL #2   Title  Pt will report improved flexibility in trunk and hips fo greater ease of ADLs    Status  On-going      PT SHORT TERM GOAL #3  Title  Pt will complete balance screen and set goal, if appropriate    Status  Achieved        PT Long Term Goals - 12/30/16 1134      PT LONG TERM GOAL #1   Title  Pt will be I with HEP as of last visit for continued strength and flexibiility.     Status  On-going      PT LONG TERM GOAL #2   Title  Pt will understand posture and body mechanics to avoid injury, flare up.     Status  On-going      PT LONG TERM GOAL #3   Title  Pt will be able to improve balance , Berg Score improved to 46/56    Status  On-going      PT LONG TERM GOAL #4   Title  Pt will be able to dorsiflex Rt. ankle fully (+10 deg) for safe driving as allowed by MD.     Baseline  to neutral, pt does not feel confident    Status  On-going      PT LONG TERM GOAL #5   Title  Pt will demo 4+/5 strength in hip abduction for improved gait and stability.     Status  On-going            Plan - 12/30/16 1135    Clinical Impression Statement  Pt needed cues for HEP but did without increased pain.  She understands Berg score.  Able to sit to stand but needed cues for controlled descent. Mod to max cues for pelvic tilting extra time spent. She uses cane all the time in community and in the home most of the time.     PT Next Visit Plan  begin simple core, check HEP, MHP, begin  posture and body mech     PT Home Exercise Plan  basic back     Consulted and Agree with Plan of Care  Patient    Family Member Consulted  Daughter [name]       Patient will benefit from skilled therapeutic intervention in order to improve the following deficits and impairments:  Decreased balance, Decreased range of motion, Impaired flexibility, Postural dysfunction, Difficulty walking, Decreased strength, Increased fascial restricitons, Pain, Decreased endurance, Impaired sensation  Visit Diagnosis: Chronic bilateral low back pain with right-sided sciatica  Muscle weakness (generalized)  Myalgia  Other abnormalities of gait and mobility     Problem List Patient Active Problem List   Diagnosis Date Noted  . Lumbar stenosis 11/10/2016  . Atherosclerosis of native artery of both lower extremities with intermittent claudication ([place]) 08/08/2016  . Peripheral arterial disease ([place]) 08/04/2016  . Leg cramps 06/20/2016  . Vitamin D deficiency 11/28/2015  . Left hip pain 11/28/2015  . Osteoporosis 09/16/2015  . Pain in joint, shoulder region 09/16/2015  . Estrogen deficiency 03/14/2015  . Spinal stenosis of lumbar region 01/24/2015  . Myalgia 01/11/2015  . Preventative health care 12/11/2014  . Essential hypertension 12/11/2014  . Pulmonary hypertension ([place])   . Chronic cough   . Chronic diastolic heart failure ([place]) 05/15/2013  . Diabetes ([place]) 05/14/2013  . Other vitamin B12 deficiency anemia 07/13/2012  . Restless legs syndrome (RLS) 07/13/2012    Cheryllynn Sarff 12/30/2016, 11:50 AM  [facility] [address] [place], [place], [address] Phone: [phone]   Fax:  [phone]  Name: Kristi Alexander MRN: [id number] Date of Birth: Jul 22, 1925  Raeford Razor, PT 12/30/16 11:50  AM Phone: [phone] Fax: [phone]

## 2017-01-01 ENCOUNTER — Ambulatory Visit: Payer: Medicare Other | Admitting: Physical Therapy

## 2017-01-01 ENCOUNTER — Encounter: Payer: Self-pay | Admitting: Physical Therapy

## 2017-01-01 DIAGNOSIS — G8929 Other chronic pain: Secondary | ICD-10-CM

## 2017-01-01 DIAGNOSIS — M6281 Muscle weakness (generalized): Secondary | ICD-10-CM

## 2017-01-01 DIAGNOSIS — R2689 Other abnormalities of gait and mobility: Secondary | ICD-10-CM

## 2017-01-01 DIAGNOSIS — M791 Myalgia, unspecified site: Secondary | ICD-10-CM

## 2017-01-01 DIAGNOSIS — M5441 Lumbago with sciatica, right side: Secondary | ICD-10-CM | POA: Diagnosis not present

## 2017-01-01 NOTE — Patient Instructions (Signed)
From EX drawer: Lumbar stab 2 ex all issued  Except #3.   Daily  5-10 x each  Tilt, clam,  Bent knee

## 2017-01-01 NOTE — Therapy (Signed)
[place], [place], [address] Phone: [phone]   Fax:  [phone]  Physical Therapy Treatment  Patient Details  Name: Kristi Alexander MRN: [id number] Date of Birth: 1925/06/15 Referring Provider: Lorin Mercy   Encounter Date: 01/01/2017  PT End of Session - 01/01/17 1818    Visit Number  4    Number of Visits  12    Date for PT Re-Evaluation  02/03/17    PT Start Time  4431    PT Stop Time  1430    PT Time Calculation (min)  57 min    Activity Tolerance  Patient tolerated treatment well    Behavior During Therapy  [facility] for tasks assessed/performed       Past Medical History:  Diagnosis Date  . Abnormality of gait 11/22/2014  . Arthritis   . Cyst of breast, right, benign solitary   . Dyslipidemia   . H/O: hysterectomy   . Heart murmur   . History of appendectomy   . History of lumbosacral spine surgery   . Hypertension   . Inguinal hernia   . Lumbar stenosis   . Restless leg syndrome     Past Surgical History:  Procedure Laterality Date  . ABDOMINAL HYSTERECTOMY    . APPENDECTOMY    . BREAST CYST EXCISION     LEFT  . HERNIA REPAIR    . TRACHEOSTOMY     feinstein    There were no vitals filed for this visit.  Subjective Assessment - 01/01/17 1350    Subjective  No pain.  My leg swelling is better.  I was able to do some cooking for homecoming so I didn't get all the exercises done.  I am tired.    Patient is accompained by:  Family member    Currently in Pain?  No/denies    Pain Location  Back                      [facility] Adult PT Treatment/Exercise - 01/01/17 0001      High Level Balance   High Level Balance Comments  purple ball step retro with trunk rotation alternation  arm towars floor rt / lt,  ahd with reashing overhead with retro step,  CGA needed at times.  Less weight shift/ time noted on left hip.        Lumbar Exercises: Stretches   Pelvic Tilt  5 reps HEP       Lumbar Exercises: Standing   Other Standing Lumbar Exercises  standing single leg hip hike from 2 feet on floor.  5 X each side,  using 1/2 inchmat      Lumbar Exercises: Seated   Sit to Stand  10 reps 2 sets mat different heights,10 X lifting green ball overhea      Lumbar Exercises: Supine   Clam  10 reps    Bent Knee Raise  10 reps    Large Ball Abdominal Isometric  10 reps    Large Ball Oblique Isometric  10 reps      Modalities   Modalities  Moist Heat      Moist Heat Therapy   Number Minutes Moist Heat  10 Minutes    Moist Heat Location  Lumbar Spine             PT Education - 01/01/17 1818    Education provided  Yes    Education Details  HEP  Person(s) Educated  Patient;Child(ren)    Methods  Handout    Comprehension  Verbalized understanding;Returned demonstration       PT Short Term Goals - 12/30/16 1133      PT SHORT TERM GOAL #1   Title  Pt will be I with initial HEP     Status  On-going      PT SHORT TERM GOAL #2   Title  Pt will report improved flexibility in trunk and hips fo greater ease of ADLs    Status  On-going      PT SHORT TERM GOAL #3   Title  Pt will complete balance screen and set goal, if appropriate    Status  Achieved        PT Long Term Goals - 12/30/16 1134      PT LONG TERM GOAL #1   Title  Pt will be I with HEP as of last visit for continued strength and flexibiility.     Status  On-going      PT LONG TERM GOAL #2   Title  Pt will understand posture and body mechanics to avoid injury, flare up.     Status  On-going      PT LONG TERM GOAL #3   Title  Pt will be able to improve balance , Berg Score improved to 46/56    Status  On-going      PT LONG TERM GOAL #4   Title  Pt will be able to dorsiflex Rt. ankle fully (+10 deg) for safe driving as allowed by MD.     Baseline  to neutral, pt does not feel confident    Status  On-going      PT LONG TERM GOAL #5   Title  Pt will demo 4+/5 strength in hip abduction  for improved gait and stability.     Status  On-going            Plan - 01/01/17 1819    Clinical Impression Statement  Able to progress her HEP.  No pain increased during session,  She was able to sit from standing with improved control at the end of session.      PT Treatment/Interventions  ADLs/Self Care Home Management;Cryotherapy;Electrical Stimulation;Moist Heat;Ultrasound;Passive range of motion;Neuromuscular re-education;Balance training;Therapeutic exercise;Therapeutic activities;Manual techniques;Functional mobility training;Patient/family education;DME Instruction;Gait training    PT Next Visit Plan  begin simple core, check HEP, MHP, begin posture and body mech     PT Home Exercise Plan  basic back , Lumbar stab 2.     Consulted and Agree with Plan of Care  Patient;Family member/caregiver    Family Member Consulted  Daughter [name]       Patient will benefit from skilled therapeutic intervention in order to improve the following deficits and impairments:     Visit Diagnosis: Chronic bilateral low back pain with right-sided sciatica  Muscle weakness (generalized)  Myalgia  Other abnormalities of gait and mobility     Problem List Patient Active Problem List   Diagnosis Date Noted  . Lumbar stenosis 11/10/2016  . Atherosclerosis of native artery of both lower extremities with intermittent claudication ([place]) 08/08/2016  . Peripheral arterial disease ([place]) 08/04/2016  . Leg cramps 06/20/2016  . Vitamin D deficiency 11/28/2015  . Left hip pain 11/28/2015  . Osteoporosis 09/16/2015  . Pain in joint, shoulder region 09/16/2015  . Estrogen deficiency 03/14/2015  . Spinal stenosis of lumbar region 01/24/2015  . Myalgia 01/11/2015  . Preventative health care  12/11/2014  . Essential hypertension 12/11/2014  . Pulmonary hypertension ([place])   . Chronic cough   . Chronic diastolic heart failure ([place]) 05/15/2013  . Diabetes ([place]) 05/14/2013  . Other vitamin B12  deficiency anemia 07/13/2012  . Restless legs syndrome (RLS) 07/13/2012    Drequan Ironside  PTA 01/01/2017, 6:22 PM  [facility] [address] [place], [place], [address] Phone: [phone]   Fax:  [phone]  Name: Kristi Alexander MRN: [id number] Date of Birth: 11/15/1925

## 2017-01-05 ENCOUNTER — Ambulatory Visit: Payer: Medicare Other | Admitting: Physical Therapy

## 2017-01-05 DIAGNOSIS — M5441 Lumbago with sciatica, right side: Principal | ICD-10-CM

## 2017-01-05 DIAGNOSIS — G8929 Other chronic pain: Secondary | ICD-10-CM

## 2017-01-05 DIAGNOSIS — M791 Myalgia, unspecified site: Secondary | ICD-10-CM

## 2017-01-05 DIAGNOSIS — M6281 Muscle weakness (generalized): Secondary | ICD-10-CM

## 2017-01-05 DIAGNOSIS — R2689 Other abnormalities of gait and mobility: Secondary | ICD-10-CM

## 2017-01-05 NOTE — Therapy (Signed)
[place], [place], [address] Phone: [phone]   Fax:  [phone]  Physical Therapy Treatment  Patient Details  Name: Kristi Alexander MRN: [id number] Date of Birth: Jun 29, 1925 Referring Provider: Lorin Mercy   Encounter Date: 01/05/2017  PT End of Session - 01/05/17 1313    Visit Number  5    Number of Visits  12    Date for PT Re-Evaluation  02/03/17    PT Start Time  1026 arr late     PT Stop Time  1110    PT Time Calculation (min)  44 min    Activity Tolerance  Patient limited by pain    Behavior During Therapy  [facility] for tasks assessed/performed       Past Medical History:  Diagnosis Date  . Abnormality of gait 11/22/2014  . Arthritis   . Cyst of breast, right, benign solitary   . Dyslipidemia   . H/O: hysterectomy   . Heart murmur   . History of appendectomy   . History of lumbosacral spine surgery   . Hypertension   . Inguinal hernia   . Lumbar stenosis   . Restless leg syndrome     Past Surgical History:  Procedure Laterality Date  . ABDOMINAL HYSTERECTOMY    . APPENDECTOMY    . BREAST CYST EXCISION     LEFT  . HERNIA REPAIR    . TRACHEOSTOMY     feinstein    There were no vitals filed for this visit.  Subjective Assessment - 01/05/17 1030    Subjective  Rt. leg is giving me alot of trouble.  I can almost not put any weight on it .  Foot is tight.      Currently in Pain?  Yes    Pain Score  7     Pain Location  Leg    Pain Orientation  Right;Lower;Lateral    Pain Type  Chronic pain    Pain Onset  More than a month ago    Pain Frequency  Intermittent    Aggravating Factors   AM, overdoing it    Pain Relieving Factors  exercises                      [facility] Adult PT Treatment/Exercise - 01/05/17 0001      Lumbar Exercises: Stretches   Active Hamstring Stretch  2 reps;30 seconds    Lower Trunk Rotation  10 seconds x 10     Pelvic Tilt  5 reps;10 seconds x 10  heavy cues       Lumbar Exercises: Aerobic   Stationary Bike  5 min UE and LE level 3 end of session       Lumbar Exercises: Supine   Clam  20 reps    Bent Knee Raise  20 reps heavy cues     Bridge  10 reps;Other (comment) 2 sets     Other Supine Lumbar Exercises  ankle DF, leg lengthener x 10,  5 sec hold       Knee/Hip Exercises: Stretches   Active Hamstring Stretch  2 reps;30 seconds    Active Hamstring Stretch Limitations  seated    Gastroc Stretch  2 reps;30 seconds    Soleus Stretch  2 reps;30 seconds      Moist Heat Therapy   Number Minutes Moist Heat  10 Minutes    Moist Heat Location  Lumbar Spine  PT Short Term Goals - 12/30/16 1133      PT SHORT TERM GOAL #1   Title  Pt will be I with initial HEP     Status  On-going      PT SHORT TERM GOAL #2   Title  Pt will report improved flexibility in trunk and hips fo greater ease of ADLs    Status  On-going      PT SHORT TERM GOAL #3   Title  Pt will complete balance screen and set goal, if appropriate    Status  Achieved        PT Long Term Goals - 12/30/16 1134      PT LONG TERM GOAL #1   Title  Pt will be I with HEP as of last visit for continued strength and flexibiility.     Status  On-going      PT LONG TERM GOAL #2   Title  Pt will understand posture and body mechanics to avoid injury, flare up.     Status  On-going      PT LONG TERM GOAL #3   Title  Pt will be able to improve balance , Berg Score improved to 46/56    Status  On-going      PT LONG TERM GOAL #4   Title  Pt will be able to dorsiflex Rt. ankle fully (+10 deg) for safe driving as allowed by MD.     Baseline  to neutral, pt does not feel confident    Status  On-going      PT LONG TERM GOAL #5   Title  Pt will demo 4+/5 strength in hip abduction for improved gait and stability.     Status  On-going            Plan - 01/05/17 1310    Clinical Impression Statement  Patient moving more slowly today as  compared to previous visits, increased pain and tightness in Rt. LE and back. Exercises improved pain level.  Increased time spent today with abdominal activation and maintaining a neutral spine, pelvis.      PT Next Visit Plan  NuStep, check HEP, MHP, begin posture and body mech , strengthen hips and core     PT Home Exercise Plan  basic back , Lumbar stab 2.     Consulted and Agree with Plan of Care  Patient;Family member/caregiver    Family Member Consulted  Daughter [name]       Patient will benefit from skilled therapeutic intervention in order to improve the following deficits and impairments:  Decreased balance, Decreased range of motion, Impaired flexibility, Postural dysfunction, Difficulty walking, Decreased strength, Increased fascial restricitons, Pain, Decreased endurance, Impaired sensation  Visit Diagnosis: Chronic bilateral low back pain with right-sided sciatica  Muscle weakness (generalized)  Myalgia  Other abnormalities of gait and mobility     Problem List Patient Active Problem List   Diagnosis Date Noted  . Lumbar stenosis 11/10/2016  . Atherosclerosis of native artery of both lower extremities with intermittent claudication ([place]) 08/08/2016  . Peripheral arterial disease ([place]) 08/04/2016  . Leg cramps 06/20/2016  . Vitamin D deficiency 11/28/2015  . Left hip pain 11/28/2015  . Osteoporosis 09/16/2015  . Pain in joint, shoulder region 09/16/2015  . Estrogen deficiency 03/14/2015  . Spinal stenosis of lumbar region 01/24/2015  . Myalgia 01/11/2015  . Preventative health care 12/11/2014  . Essential hypertension 12/11/2014  . Pulmonary hypertension ([place])   . Chronic cough   .  Chronic diastolic heart failure ([place]) 05/15/2013  . Diabetes ([place]) 05/14/2013  . Other vitamin B12 deficiency anemia 07/13/2012  . Restless legs syndrome (RLS) 07/13/2012    PAA,JENNIFER 01/05/2017, 1:14 PM  [facility] [address] [place], [place], [address] Phone: [phone]   Fax:  [phone]  Name: Kristi Alexander MRN: [id number] Date of Birth: June 19, 1925  Raeford Razor, PT 01/05/17 1:14 PM Phone: [phone] Fax: [phone]

## 2017-01-06 ENCOUNTER — Other Ambulatory Visit: Payer: Self-pay

## 2017-01-06 ENCOUNTER — Encounter: Payer: Self-pay | Admitting: Internal Medicine

## 2017-01-06 ENCOUNTER — Ambulatory Visit (INDEPENDENT_AMBULATORY_CARE_PROVIDER_SITE_OTHER): Payer: Medicare Other | Admitting: Internal Medicine

## 2017-01-06 VITALS — BP 165/119 | HR 74 | Temp 98.7°F | Ht 64.5 in | Wt 146.0 lb

## 2017-01-06 DIAGNOSIS — Z23 Encounter for immunization: Secondary | ICD-10-CM | POA: Diagnosis not present

## 2017-01-06 DIAGNOSIS — Z0000 Encounter for general adult medical examination without abnormal findings: Secondary | ICD-10-CM | POA: Diagnosis not present

## 2017-01-06 DIAGNOSIS — E559 Vitamin D deficiency, unspecified: Secondary | ICD-10-CM

## 2017-01-06 DIAGNOSIS — I10 Essential (primary) hypertension: Secondary | ICD-10-CM

## 2017-01-06 DIAGNOSIS — R5383 Other fatigue: Secondary | ICD-10-CM | POA: Diagnosis not present

## 2017-01-06 DIAGNOSIS — B351 Tinea unguium: Secondary | ICD-10-CM

## 2017-01-06 NOTE — Assessment & Plan Note (Signed)
Patient reports feeling tired and run down with little energy. Reports good appetite, no hair thinning, weight gain, dry skin, impaired memory, depression, or sleep disturbances.   Will check TSH today.

## 2017-01-06 NOTE — Assessment & Plan Note (Signed)
BP Readings from Last 3 Encounters:  01/06/17 (!) 165/119  12/16/16 (!) 160/74  11/21/16 (!) 171/86    Lab Results  Component Value Date   NA 137 11/05/2016   K 3.7 11/05/2016   CREATININE 0.88 11/05/2016   BP elevated above goal today. Currently taking Amlodipine 10 mg daily, Imdur 45 mg daily, Losartan (unclear dosing 50 or 100?), and metoprolol 25 mg bid. Does not have her medications with her today.   A/P: Will continue current medications today. Asked patient to bring medications with her to next visit to clarify her dosing. RTC in 1 month for follow up.

## 2017-01-06 NOTE — Patient Instructions (Addendum)
Annual Wellness Visit   Medicare Covered Preventative Screenings and Services  Services & Screenings Men and Women Who How Often Need? Date of Last Service Action  Vaccines Z23  Hepatitis B  Influenza   Pneumonia  Adults   Once  Once every flu season  Two different vaccines separated by one year     Next Annual Wellness Visit People with Medicare Every year       Things That May Be Affecting Your Health:  Alcohol  Hearing loss  Pain    Depression  Home Safety  Sexual Health   Diabetes  Lack of physical activity  Stress   Difficulty with daily activities  Loneliness  Tiredness   Drug use  Medicines  Tobacco use   Falls  Motor Vehicle Safety  Weight   Food choices  Oral Health  Other    YOUR PERSONALIZED HEALTH PLAN : 1. Schedule your next subsequent Medicare Wellness visit in one year 2. Attend all of your regular appointments to address your medical issues 3. Complete the preventative screenings and services 4. Please follow up with me in 1 month for re-check

## 2017-01-06 NOTE — Assessment & Plan Note (Signed)
Previously on Vit D supplementation. Has not taken for several months.   Recheck level today.

## 2017-01-06 NOTE — Progress Notes (Signed)
Subjective:   Kristi Alexander is a 83 y.o. female who presents for a Medicare Annual Wellness Visit.  The following items have been reviewed and updated today in the appropriate area in the EMR.   Health Risk Assessment  Height, weight, BMI, and BP Depression screen Fall risk / safety level Medical and family history were reviewed and updated Updating list of other providers & suppliers Medication reconciliation, including over the counter medicines Cognitive screen Written screening schedule Risk Factor list Personalized health advice, risky behaviors, and treatment advice  Kristi Alexander is doing very well. She reports she recently had back surgery and is still having some difficulties with ambulation but is currently going to PT twice a week. Reports she is doing well with PT and gaining mobility back. She denies any falls. She uses a cane to walk. She does not have any steps at home and generally avoids steps. She lives with her daughter who helps her around the house and with shopping. She does all of the cooking and reports she enjoys cooking. She notes that her feet bother her, specifically her toenails, and would like to see a podiatrist. She says that her feet limit her function and keep her from doing everything she wants to do. She manages all of her own medications and has no difficulties remembering to take them. No sleeping difficulties.     Objective:    Vitals: BP (!) 165/119 (BP Location: Right Arm, Patient Position: Sitting, Cuff Size: Normal)   Pulse 74   Temp 98.7 F (37.1 C) (Oral)   Ht 5' 4.5" (1.638 m)   Wt 146 lb (66.2 kg)   SpO2 100%   BMI 24.67 kg/m   Activities of Daily Living In your present state of health, do you have any difficulty performing the following activities: 01/06/2017 11/10/2016  Hearing? N N  Vision? Y N  Difficulty concentrating or making decisions? N N  Walking or climbing stairs? Y Y  Dressing or bathing? N N  Doing errands, shopping?  N Y  Some recent data might be hidden    Fall Risk Fall Risk  01/06/2017 08/04/2016 06/19/2016 03/05/2016 11/28/2015  Falls in the past year? No Yes Yes Yes Yes  Number falls in past yr: - 2 or more 2 or more 1 1  Injury with Fall? - No - Yes Yes  Risk Factor Category  - High Fall Risk High Fall Risk High Fall Risk High Fall Risk  Risk for fall due to : - Impaired balance/gait Impaired balance/gait Impaired balance/gait;History of fall(s) History of fall(s);Impaired balance/gait  Risk for fall due to: Comment - - - slipped in Ant spray  -  Follow up - - - Falls prevention discussed Falls prevention discussed    Depression Screen PHQ 2/9 Scores 01/06/2017 08/04/2016 06/19/2016 03/05/2016  PHQ - 2 Score 0 0 0 0     Cognitive Testing I assessed the patient for cognitive issues and the patient did not have issues with his / her cognition.  Mini-Cog - 01/06/17 1101    Normal clock drawing test?  yes    How many words correct?  2       Assessment and Plan:    During the course of the visit the patient was educated and counseled about appropriate screening and preventive services as documented in the assessment and plan.  The printed AVS was given to the patient and included an updated screening schedule, a list of risk factors, and personalized health  advice.    Onychomycosis Patient requesting referral to podiatry for her feet. Toenails overgrown with onychomycosis.  Will refer to podiatry today.   Essential hypertension BP Readings from Last 3 Encounters:  01/06/17 (!) 165/119  12/16/16 (!) 160/74  11/21/16 (!) 171/86    Lab Results  Component Value Date   NA 137 11/05/2016   K 3.7 11/05/2016   CREATININE 0.88 11/05/2016   BP elevated above goal today. Currently taking Amlodipine 10 mg daily, Imdur 45 mg daily, Losartan (unclear dosing 50 or 100?), and metoprolol 25 mg bid. Does not have her medications with her today.   A/P: Will continue current medications today. Asked  patient to bring medications with her to next visit to clarify her dosing. RTC in 1 month for follow up.   Fatigue Patient reports feeling tired and run down with little energy. Reports good appetite, no hair thinning, weight gain, dry skin, impaired memory, depression, or sleep disturbances.   Will check TSH today.   Vitamin D deficiency Previously on Vit D supplementation. Has not taken for several months.   Recheck level today.    Maryellen Pile, MD  01/06/2017

## 2017-01-06 NOTE — Assessment & Plan Note (Signed)
Patient requesting referral to podiatry for her feet. Toenails overgrown with onychomycosis.  Will refer to podiatry today.

## 2017-01-07 LAB — VITAMIN D 25 HYDROXY (VIT D DEFICIENCY, FRACTURES): Vit D, 25-Hydroxy: 18.9 ng/mL — ABNORMAL LOW (ref 30.0–100.0)

## 2017-01-07 LAB — BASIC METABOLIC PANEL
BUN/Creatinine Ratio: 24 (ref 12–28)
BUN: 18 mg/dL (ref 8–27)
CO2: 24 mmol/L (ref 20–29)
CREATININE: 0.74 mg/dL (ref 0.57–1.00)
Calcium: 9.6 mg/dL (ref 8.7–10.3)
Chloride: 106 mmol/L (ref 96–106)
GFR, EST AFRICAN AMERICAN: 83 mL/min/{1.73_m2} (ref >59)
GFR, EST NON AFRICAN AMERICAN: 72 mL/min/{1.73_m2} (ref >59)
Glucose: 95 mg/dL (ref 65–99)
POTASSIUM: 4 mmol/L (ref 3.5–5.2)
SODIUM: 144 mmol/L (ref 134–144)

## 2017-01-07 LAB — TSH: TSH: 1.76 u[IU]/mL (ref 0.450–4.500)

## 2017-01-07 NOTE — Progress Notes (Signed)
Internal Medicine Clinic Attending  Case discussed with Dr. Boswell at the time of the visit.  We reviewed the resident's history and exam and pertinent patient test results.  I agree with the assessment, diagnosis, and plan of care documented in the resident's note.  

## 2017-01-08 ENCOUNTER — Encounter: Payer: Self-pay | Admitting: Physical Therapy

## 2017-01-08 ENCOUNTER — Ambulatory Visit: Payer: Medicare Other | Admitting: Physical Therapy

## 2017-01-08 DIAGNOSIS — I272 Pulmonary hypertension, unspecified: Secondary | ICD-10-CM | POA: Diagnosis not present

## 2017-01-08 DIAGNOSIS — K922 Gastrointestinal hemorrhage, unspecified: Secondary | ICD-10-CM | POA: Diagnosis present

## 2017-01-08 DIAGNOSIS — M48061 Spinal stenosis, lumbar region without neurogenic claudication: Secondary | ICD-10-CM | POA: Diagnosis not present

## 2017-01-08 DIAGNOSIS — M6281 Muscle weakness (generalized): Secondary | ICD-10-CM

## 2017-01-08 DIAGNOSIS — I5032 Chronic diastolic (congestive) heart failure: Secondary | ICD-10-CM | POA: Diagnosis not present

## 2017-01-08 DIAGNOSIS — E785 Hyperlipidemia, unspecified: Secondary | ICD-10-CM | POA: Diagnosis not present

## 2017-01-08 DIAGNOSIS — E559 Vitamin D deficiency, unspecified: Secondary | ICD-10-CM | POA: Diagnosis not present

## 2017-01-08 DIAGNOSIS — M5441 Lumbago with sciatica, right side: Principal | ICD-10-CM

## 2017-01-08 DIAGNOSIS — K5731 Diverticulosis of large intestine without perforation or abscess with bleeding: Secondary | ICD-10-CM | POA: Diagnosis not present

## 2017-01-08 DIAGNOSIS — Z7982 Long term (current) use of aspirin: Secondary | ICD-10-CM | POA: Diagnosis not present

## 2017-01-08 DIAGNOSIS — I110 Hypertensive heart disease with heart failure: Secondary | ICD-10-CM | POA: Diagnosis not present

## 2017-01-08 DIAGNOSIS — D62 Acute posthemorrhagic anemia: Secondary | ICD-10-CM | POA: Diagnosis not present

## 2017-01-08 DIAGNOSIS — Z87891 Personal history of nicotine dependence: Secondary | ICD-10-CM | POA: Diagnosis not present

## 2017-01-08 DIAGNOSIS — Z880 Allergy status to penicillin: Secondary | ICD-10-CM | POA: Diagnosis not present

## 2017-01-08 DIAGNOSIS — R2689 Other abnormalities of gait and mobility: Secondary | ICD-10-CM

## 2017-01-08 DIAGNOSIS — G8929 Other chronic pain: Secondary | ICD-10-CM

## 2017-01-08 DIAGNOSIS — M791 Myalgia, unspecified site: Secondary | ICD-10-CM

## 2017-01-08 DIAGNOSIS — N179 Acute kidney failure, unspecified: Secondary | ICD-10-CM | POA: Diagnosis not present

## 2017-01-08 DIAGNOSIS — M810 Age-related osteoporosis without current pathological fracture: Secondary | ICD-10-CM | POA: Diagnosis not present

## 2017-01-08 DIAGNOSIS — Z79899 Other long term (current) drug therapy: Secondary | ICD-10-CM | POA: Diagnosis not present

## 2017-01-08 DIAGNOSIS — G2581 Restless legs syndrome: Secondary | ICD-10-CM | POA: Diagnosis not present

## 2017-01-08 DIAGNOSIS — E1151 Type 2 diabetes mellitus with diabetic peripheral angiopathy without gangrene: Secondary | ICD-10-CM | POA: Diagnosis not present

## 2017-01-08 NOTE — Therapy (Signed)
[facility] [place], [place], [address] Phone: [phone]   Fax:  [phone]  Physical Therapy Treatment  Patient Details  Name: Kristi Alexander MRN: [id number] Date of Birth: 10/28/1925 Referring Provider: Lorin Mercy   Encounter Date: 01/08/2017  PT End of Session - 01/08/17 1211    Visit Number  6    Number of Visits  12    Date for PT Re-Evaluation  02/03/17    PT Start Time  1324    PT Stop Time  1111    PT Time Calculation (min)  52 min    Activity Tolerance  Patient tolerated treatment well    Behavior During Therapy  [facility] for tasks assessed/performed       Past Medical History:  Diagnosis Date  . Abnormality of gait 11/22/2014  . Arthritis   . Cyst of breast, right, benign solitary   . Dyslipidemia   . H/O: hysterectomy   . Heart murmur   . History of appendectomy   . History of lumbosacral spine surgery   . Hypertension   . Inguinal hernia   . Lumbar stenosis   . Restless leg syndrome     Past Surgical History:  Procedure Laterality Date  . ABDOMINAL HYSTERECTOMY    . APPENDECTOMY    . BOWEL RESECTION N/A 03/23/2013   Procedure: SMALL BOWEL RESECTION;  Surgeon: Gwenyth Ober, MD;  Location: [facility];  Service: General;  Laterality: N/A;  . BREAST CYST EXCISION     LEFT  . ESOPHAGOGASTRODUODENOSCOPY N/A 04/01/2013   Procedure: ESOPHAGOGASTRODUODENOSCOPY (EGD);  Surgeon: Gwenyth Ober, MD;  Location: [facility];  Service: General;  Laterality: N/A;  . HERNIA REPAIR    . LAPAROTOMY N/A 03/23/2013   Procedure: EXPLORATORY LAPAROTOMY;  Surgeon: Gwenyth Ober, MD;  Location: [facility];  Service: General;  Laterality: N/A;  . LUMBAR LAMINECTOMY/DECOMPRESSION MICRODISCECTOMY N/A 11/10/2016   Procedure: L4-5 DECOMPRESSION FOR RECURRENT STENOSIS;  Surgeon: Marybelle Killings, MD;  Location: [facility];  Service: Orthopedics;  Laterality: N/A;  . PEG PLACEMENT N/A 04/01/2013   Procedure: PERCUTANEOUS ENDOSCOPIC GASTROSTOMY  (PEG) PLACEMENT;  Surgeon: Gwenyth Ober, MD;  Location: [employer];  Service: General;  Laterality: N/A;  . TRACHEOSTOMY     feinstein    There were no vitals filed for this visit.  Subjective Assessment - 01/08/17 1023    Subjective  Rt. leg is giving me alot of trouble.  I can almost not put any weight on it .  Foot is tight.    Ir is coming from the back    Patient is accompained by:  Family member    Currently in Pain?  Yes    Pain Score  -- No pain when I came in here,  but at home it was so bad I needed cane to walk moderate + pain,  no number given    Pain Type  Chronic pain    Pain Radiating Towards  into right foo ankle,  down leg from back    Aggravating Factors   am walking    Pain Relieving Factors  feels better in PT    Effect of Pain on Daily Activities  limits walking.                        [facility] Adult PT Treatment/Exercise - 01/08/17 0001      High Level Balance   High Level Balance Comments  alternate step  on foot stool , tandem,  static,  weight shifting,  feet together dynamic arms.  Min assiist to avoid falls.  l      Lumbar Exercises: Aerobic   Stationary Bike  8 min UE and LE level 3 end of session       Lumbar Exercises: Seated   Other Seated Lumbar Exercises  on green ball core stabilization, pelvic mobility exercises,  close SBA   patient enjoyed.       Knee/Hip Exercises: Stretches   Passive Hamstring Stretch  3 reps;30 seconds concurrent with ankle stretch into DF with strap,  both    Gastroc Stretch  3 reps;30 seconds    Gastroc Stretch Limitations  strap sitting      Knee/Hip Exercises: Seated   Hamstring Curl  3 sets;10 reps    Hamstring Limitations  red pand.  part movements      Modalities   Modalities  Moist Heat      Moist Heat Therapy   Number Minutes Moist Heat  10 Minutes    Moist Heat Location  Lumbar Spine               PT Short Term Goals - 01/08/17 1213      PT SHORT TERM GOAL #1   Title  Pt will be  I with initial HEP     Baseline  needs cues,  wants someone with her to exercise    Time  3    Period  Weeks    Status  On-going      PT SHORT TERM GOAL #2   Title  Pt will report improved flexibility in trunk and hips fo greater ease of ADLs    Time  3    Period  Weeks        PT Long Term Goals - 12/30/16 1134      PT LONG TERM GOAL #1   Title  Pt will be I with HEP as of last visit for continued strength and flexibiility.     Status  On-going      PT LONG TERM GOAL #2   Title  Pt will understand posture and body mechanics to avoid injury, flare up.     Status  On-going      PT LONG TERM GOAL #3   Title  Pt will be able to improve balance , Berg Score improved to 46/56    Status  On-going      PT LONG TERM GOAL #4   Title  Pt will be able to dorsiflex Rt. ankle fully (+10 deg) for safe driving as allowed by MD.     Baseline  to neutral, pt does not feel confident    Status  On-going      PT LONG TERM GOAL #5   Title  Pt will demo 4+/5 strength in hip abduction for improved gait and stability.     Status  On-going            Plan - 01/08/17 1211    Clinical Impression Statement  patient notes not being to bear weight into right leg,  however she is able to do so here.  Balance continues to be a challange.  Patient had fun while doing core exercises on the large green ball.     PT Next Visit Plan  NuStep, check HEP, MHP, begin posture and body mech , strengthen hips and core     PT Home Exercise Plan  basic back ,  Lumbar stab 2.     Consulted and Agree with Plan of Care  Patient;Family member/caregiver    Family Member Consulted  Daughter [name]       Patient will benefit from skilled therapeutic intervention in order to improve the following deficits and impairments:     Visit Diagnosis: Chronic bilateral low back pain with right-sided sciatica  Muscle weakness (generalized)  Myalgia  Other abnormalities of gait and mobility     Problem List Patient  Active Problem List   Diagnosis Date Noted  . Onychomycosis 01/06/2017  . Fatigue 01/06/2017  . Lumbar stenosis 11/10/2016  . Atherosclerosis of native artery of both lower extremities with intermittent claudication ([place]) 08/08/2016  . Peripheral arterial disease ([place]) 08/04/2016  . Leg cramps 06/20/2016  . Vitamin D deficiency 11/28/2015  . Left hip pain 11/28/2015  . Osteoporosis 09/16/2015  . Pain in joint, shoulder region 09/16/2015  . Estrogen deficiency 03/14/2015  . Spinal stenosis of lumbar region 01/24/2015  . Myalgia 01/11/2015  . Preventative health care 12/11/2014  . Essential hypertension 12/11/2014  . Pulmonary hypertension ([place])   . Chronic cough   . Chronic diastolic heart failure ([place]) 05/15/2013  . Diabetes ([place]) 05/14/2013  . Other vitamin B12 deficiency anemia 07/13/2012  . Restless legs syndrome (RLS) 07/13/2012    HARRIS,KAREN PTA 01/08/2017, 12:15 PM  [facility] [address] [place], [place], [address] Phone: [phone]   Fax:  [phone]  Name: Kristi Alexander MRN: [id number] Date of Birth: 07-14-1925

## 2017-01-10 ENCOUNTER — Other Ambulatory Visit: Payer: Self-pay

## 2017-01-10 ENCOUNTER — Observation Stay (HOSPITAL_COMMUNITY): Payer: Medicare Other

## 2017-01-10 ENCOUNTER — Encounter (HOSPITAL_COMMUNITY): Payer: Self-pay | Admitting: Emergency Medicine

## 2017-01-10 ENCOUNTER — Inpatient Hospital Stay (HOSPITAL_COMMUNITY)
Admission: EM | Admit: 2017-01-10 | Discharge: 2017-01-12 | DRG: 378 | Disposition: A | Discharge status: Home / Self Care | Payer: Medicare Other | Attending: Student in an Organized Health Care Education/Training Program | Admitting: Student in an Organized Health Care Education/Training Program

## 2017-01-10 DIAGNOSIS — E1151 Type 2 diabetes mellitus with diabetic peripheral angiopathy without gangrene: Secondary | ICD-10-CM | POA: Diagnosis present

## 2017-01-10 DIAGNOSIS — Z7982 Long term (current) use of aspirin: Secondary | ICD-10-CM

## 2017-01-10 DIAGNOSIS — I5032 Chronic diastolic (congestive) heart failure: Secondary | ICD-10-CM | POA: Diagnosis present

## 2017-01-10 DIAGNOSIS — K625 Hemorrhage of anus and rectum: Secondary | ICD-10-CM

## 2017-01-10 DIAGNOSIS — E559 Vitamin D deficiency, unspecified: Secondary | ICD-10-CM | POA: Diagnosis present

## 2017-01-10 DIAGNOSIS — Z87891 Personal history of nicotine dependence: Secondary | ICD-10-CM

## 2017-01-10 DIAGNOSIS — N179 Acute kidney failure, unspecified: Secondary | ICD-10-CM | POA: Diagnosis present

## 2017-01-10 DIAGNOSIS — K922 Gastrointestinal hemorrhage, unspecified: Secondary | ICD-10-CM | POA: Diagnosis present

## 2017-01-10 DIAGNOSIS — M810 Age-related osteoporosis without current pathological fracture: Secondary | ICD-10-CM | POA: Diagnosis present

## 2017-01-10 DIAGNOSIS — I110 Hypertensive heart disease with heart failure: Secondary | ICD-10-CM | POA: Diagnosis present

## 2017-01-10 DIAGNOSIS — M48061 Spinal stenosis, lumbar region without neurogenic claudication: Secondary | ICD-10-CM | POA: Diagnosis present

## 2017-01-10 DIAGNOSIS — E785 Hyperlipidemia, unspecified: Secondary | ICD-10-CM | POA: Diagnosis present

## 2017-01-10 DIAGNOSIS — Z79899 Other long term (current) drug therapy: Secondary | ICD-10-CM

## 2017-01-10 DIAGNOSIS — K5731 Diverticulosis of large intestine without perforation or abscess with bleeding: Principal | ICD-10-CM | POA: Diagnosis present

## 2017-01-10 DIAGNOSIS — Z880 Allergy status to penicillin: Secondary | ICD-10-CM

## 2017-01-10 DIAGNOSIS — G2581 Restless legs syndrome: Secondary | ICD-10-CM | POA: Diagnosis present

## 2017-01-10 DIAGNOSIS — I272 Pulmonary hypertension, unspecified: Secondary | ICD-10-CM | POA: Diagnosis present

## 2017-01-10 DIAGNOSIS — D62 Acute posthemorrhagic anemia: Secondary | ICD-10-CM | POA: Diagnosis present

## 2017-01-10 LAB — CBC
HEMATOCRIT: 31.6 % — AB (ref 36.0–46.0)
HEMOGLOBIN: 10 g/dL — AB (ref 12.0–15.0)
MCH: 28.2 pg (ref 26.0–34.0)
MCHC: 31.6 g/dL (ref 30.0–36.0)
MCV: 89 fL (ref 78.0–100.0)
Platelets: 252 10*3/uL (ref 150–400)
RBC: 3.55 MIL/uL — AB (ref 3.87–5.11)
RDW: 17.9 % — ABNORMAL HIGH (ref 11.5–15.5)
WBC: 8.8 10*3/uL (ref 4.0–10.5)

## 2017-01-10 LAB — COMPREHENSIVE METABOLIC PANEL
ALT: 10 U/L — AB (ref 14–54)
ANION GAP: 4 — AB (ref 5–15)
AST: 22 U/L (ref 15–41)
Albumin: 3.1 g/dL — ABNORMAL LOW (ref 3.5–5.0)
Alkaline Phosphatase: 77 U/L (ref 38–126)
BUN: 32 mg/dL — ABNORMAL HIGH (ref 6–20)
CHLORIDE: 114 mmol/L — AB (ref 101–111)
CO2: 23 mmol/L (ref 22–32)
Calcium: 8.7 mg/dL — ABNORMAL LOW (ref 8.9–10.3)
Creatinine, Ser: 1.22 mg/dL — ABNORMAL HIGH (ref 0.44–1.00)
GFR calc non Af Amer: 38 mL/min — ABNORMAL LOW (ref >60)
GFR, EST AFRICAN AMERICAN: 44 mL/min — AB (ref >60)
Glucose, Bld: 128 mg/dL — ABNORMAL HIGH (ref 65–99)
Potassium: 4.3 mmol/L (ref 3.5–5.1)
SODIUM: 141 mmol/L (ref 135–145)
Total Bilirubin: 0.4 mg/dL (ref 0.3–1.2)
Total Protein: 6 g/dL — ABNORMAL LOW (ref 6.5–8.1)

## 2017-01-10 LAB — TYPE AND SCREEN
ABO/RH(D): O POS
Antibody Screen: NEGATIVE

## 2017-01-10 LAB — POC OCCULT BLOOD, ED: FECAL OCCULT BLD: POSITIVE — AB

## 2017-01-10 MED ORDER — ASPIRIN EC 81 MG PO TBEC: 81.0000 mg | DELAYED_RELEASE_TABLET | Freq: Every day | ORAL | Status: DC

## 2017-01-10 MED ORDER — HYDROXYZINE HCL 25 MG PO TABS: 25.0000 mg | ORAL_TABLET | Freq: Every day | ORAL | Status: DC | PRN

## 2017-01-10 MED ORDER — IOPAMIDOL (ISOVUE-300) INJECTION 61%
INTRAVENOUS | Status: AC
  Filled 2017-01-10: qty 100

## 2017-01-10 MED ORDER — SODIUM CHLORIDE 0.9 % IV SOLN
INTRAVENOUS | Status: DC
  Administered 2017-01-11: via INTRAVENOUS

## 2017-01-10 MED ORDER — ACETAMINOPHEN 650 MG RE SUPP: 650.0000 mg | Freq: Four times a day (QID) | RECTAL | Status: DC | PRN

## 2017-01-10 MED ORDER — IOPAMIDOL (ISOVUE-300) INJECTION 61%
INTRAVENOUS | Status: AC
  Administered 2017-01-10: 75 mL
  Filled 2017-01-10: qty 75

## 2017-01-10 MED ORDER — AMLODIPINE BESYLATE 10 MG PO TABS
10.0000 mg | ORAL_TABLET | Freq: Every day | ORAL | Status: DC
Start: 2017-01-11 — End: 2017-01-12
  Administered 2017-01-11 – 2017-01-12 (×2): 10 mg via ORAL
  Filled 2017-01-10 (×2): qty 1

## 2017-01-10 MED ORDER — METOPROLOL SUCCINATE ER 25 MG PO TB24
25.0000 mg | ORAL_TABLET | Freq: Every day | ORAL | Status: DC
Start: 2017-01-11 — End: 2017-01-12
  Administered 2017-01-11 – 2017-01-12 (×2): 25 mg via ORAL
  Filled 2017-01-10 (×2): qty 1

## 2017-01-10 MED ORDER — ISOSORBIDE MONONITRATE ER 30 MG PO TB24
45.0000 mg | ORAL_TABLET | Freq: Every day | ORAL | Status: DC
Start: 2017-01-11 — End: 2017-01-12
  Administered 2017-01-11 – 2017-01-12 (×2): 45 mg via ORAL
  Filled 2017-01-10 (×3): qty 2

## 2017-01-10 MED ORDER — ACETAMINOPHEN 325 MG PO TABS: 650.0000 mg | ORAL_TABLET | Freq: Four times a day (QID) | ORAL | Status: DC | PRN

## 2017-01-10 NOTE — ED Provider Notes (Signed)
[facility] EMERGENCY DEPARTMENT Provider Note   CSN: [id number] Arrival date & time: 01/10/17  1758     History   Chief Complaint Chief Complaint  Patient presents with  . Rectal Bleeding    HPI Kristi Alexander is a 83 y.o. female.  HPI   Patient is a very pleasant 83 year old female with past medical history significant for hypertension dyslipidemi,a presenting today with rectal bleeding.  Patient has had multiple bright red stools.  Patient notes some of it is maroon in color or bright red.  No black.  Patient has no abdominal pain, no rectal pain.  Felt only mildly weak.  Past Medical History:  Diagnosis Date  . Abnormality of gait 11/22/2014  . Arthritis   . Cyst of breast, right, benign solitary   . Dyslipidemia   . H/O: hysterectomy   . Heart murmur   . History of appendectomy   . History of lumbosacral spine surgery   . Hypertension   . Inguinal hernia   . Lumbar stenosis   . Restless leg syndrome     Patient Active Problem List   Diagnosis Date Noted  . Onychomycosis 01/06/2017  . Fatigue 01/06/2017  . Lumbar stenosis 11/10/2016  . Atherosclerosis of native artery of both lower extremities with intermittent claudication ([place]) 08/08/2016  . Peripheral arterial disease ([place]) 08/04/2016  . Leg cramps 06/20/2016  . Vitamin D deficiency 11/28/2015  . Left hip pain 11/28/2015  . Osteoporosis 09/16/2015  . Pain in joint, shoulder region 09/16/2015  . Estrogen deficiency 03/14/2015  . Spinal stenosis of lumbar region 01/24/2015  . Myalgia 01/11/2015  . Preventative health care 12/11/2014  . Essential hypertension 12/11/2014  . Pulmonary hypertension ([place])   . Chronic cough   . Chronic diastolic heart failure ([place]) 05/15/2013  . Diabetes ([place]) 05/14/2013  . Other vitamin B12 deficiency anemia 07/13/2012  . Restless legs syndrome (RLS) 07/13/2012    Past Surgical History:  Procedure Laterality Date  . ABDOMINAL HYSTERECTOMY    .  APPENDECTOMY    . BREAST CYST EXCISION     LEFT  . ESOPHAGOGASTRODUODENOSCOPY (EGD) N/A 04/01/2013   Performed by Doreen Salvage, MD at [place] [facility]  . EXPLORATORY LAPAROTOMY N/A 03/23/2013   Performed by Doreen Salvage, MD at [facility]  . HERNIA REPAIR    . L4-5 DECOMPRESSION FOR RECURRENT STENOSIS N/A 11/10/2016   Performed by Marybelle Killings, MD at [place] [facility]  . PERCUTANEOUS ENDOSCOPIC GASTROSTOMY (PEG) PLACEMENT N/A 04/01/2013   Performed by Doreen Salvage, MD at [facility]  . SMALL BOWEL RESECTION N/A 03/23/2013   Performed by Doreen Salvage, MD at [facility]     feinstein    OB History    No data available       Home Medications    Prior to Admission medications   Medication Sig Start Date End Date Taking? Authorizing Provider  acetaminophen (TYLENOL) 500 MG tablet Take 500 mg every 6 (six) hours as needed by mouth (pain).   Yes [provider]  amLODipine (NORVASC) 10 MG tablet TAKE 1 TABLET BY MOUTH ONCE DAILY Patient taking differently: TAKE 1 TABLET (10 MG)  BY MOUTH ONCE DAILY 09/11/16  Yes Maryellen Pile, MD  aspirin EC 81 MG tablet Take 81 mg daily by mouth.   Yes [provider]  b complex vitamins tablet Take 1 tablet by mouth daily.   Yes [provider]  cetirizine (ZYRTEC) 10 MG tablet Take 10 mg  daily as needed by mouth for allergies.   Yes [provider]  ferrous sulfate 325 (65 FE) MG tablet Take 325 mg daily with breakfast by mouth.   Yes [provider]  fluticasone (FLONASE) 50 MCG/ACT nasal spray Place 1 spray daily as needed into both nostrils for allergies or rhinitis.   Yes [provider]  hydrOXYzine (ATARAX/VISTARIL) 25 MG tablet TAKE ONE TABLET BY MOUTH ONCE DAILY AS NEEDED Patient taking differently: TAKE ONE TABLET (25 MG) BY MOUTH DAILY AS NEEDED FOR ITCHING 01/09/16  Yes Maryellen Pile, MD  isosorbide mononitrate (IMDUR) 30 MG 24 hr tablet TAKE ONE AND ONE-HALF TABLETS BY MOUTH ONCE DAILY Patient taking  differently: TAKE ONE AND ONE-HALF TABLETS (45 MG) BY MOUTH ONCE DAILY 07/22/16  Yes Maryellen Pile, MD  losartan (COZAAR) 100 MG tablet Take 1 tablet (100 mg total) by mouth daily. 06/20/16 06/20/17 Yes Maryellen Pile, MD  metoprolol succinate (TOPROL-XL) 25 MG 24 hr tablet TAKE ONE TABLET BY MOUTH ONCE DAILY Patient taking differently: TAKE ONE TABLET (25 MG) BY MOUTH ONCE DAILY 04/21/16  Yes Maryellen Pile, MD  naproxen sodium (ALEVE) 220 MG tablet Take 440 mg 2 (two) times daily as needed by mouth (pain).   Yes [provider]  Vitamin D, Ergocalciferol, (DRISDOL) 50000 units CAPS capsule Take 50,000 Units every Sunday by mouth.   Yes [provider]    Family History Family History  Problem Relation Age of Onset  . Heart attack Father   . Lung cancer Brother   . Healthy Sister     Social History Social History   Tobacco Use  . Smoking status: Former Smoker    Packs/day: 0.00    Types: Cigarettes    Last attempt to quit: 02/25/2003    Years since quitting: 13.8  . Smokeless tobacco: Never Used  Substance Use Topics  . Alcohol use: No    Alcohol/week: 0.0 oz    Comment: socailly.  . Drug use: No     Allergies   Penicillins and Pravastatin   Review of Systems Review of Systems  Constitutional: Negative for activity change, fatigue and fever.  Respiratory: Negative for shortness of breath.   Cardiovascular: Negative for chest pain.  Gastrointestinal: Positive for anal bleeding and blood in stool. Negative for abdominal pain.  Genitourinary: Negative for dysuria.  Neurological: Positive for light-headedness.  All other systems reviewed and are negative.    Physical Exam Updated Vital Signs BP (!) 120/52 (BP Location: Left Arm)   Pulse 72   Temp 97.9 F (36.6 C) (Oral)   Resp 16   SpO2 97%   Physical Exam  Constitutional: She is oriented to person, place, and time. She appears well-developed and well-nourished.  HENT:  Head: Normocephalic  and atraumatic.  Eyes: Right eye exhibits no discharge.  Cardiovascular: Normal rate, regular rhythm and normal heart sounds.  No murmur heard. Pulmonary/Chest: Effort normal and breath sounds normal. She has no wheezes. She has no rales.  Abdominal: Soft. She exhibits no distension. There is no tenderness.  Genitourinary:  Genitourinary Comments: Bright red red blood per rectum.  Neurological: She is oriented to person, place, and time.  Skin: Skin is warm and dry. She is not diaphoretic.  Psychiatric: She has a normal mood and affect.  Nursing note and vitals reviewed.    ED Treatments / Results  Labs (all labs ordered are listed, but only abnormal results are displayed) Labs Reviewed  COMPREHENSIVE METABOLIC PANEL - Abnormal; Notable for  the following components:      Result Value   Chloride 114 (*)    Glucose, Bld 128 (*)    BUN 32 (*)    Creatinine, Ser 1.22 (*)    Calcium 8.7 (*)    Total Protein 6.0 (*)    Albumin 3.1 (*)    ALT 10 (*)    GFR calc non Af Amer 38 (*)    GFR calc Af Amer 44 (*)    Anion gap 4 (*)    All other components within normal limits  CBC - Abnormal; Notable for the following components:   RBC 3.55 (*)    Hemoglobin 10.0 (*)    HCT 31.6 (*)    RDW 17.9 (*)    All other components within normal limits  POC OCCULT BLOOD, ED  TYPE AND SCREEN    EKG  EKG Interpretation None       Radiology No results found.  Procedures Procedures (including critical care time)  Medications Ordered in ED Medications - No data to display   Initial Impression / Assessment and Plan / ED Course  I have reviewed the triage vital signs and the nursing notes.  Pertinent labs & imaging results that were available during my care of the patient were reviewed by me and considered in my medical decision making (see chart for details).     Patient is a very pleasant 83 year old female with past medical history significant for hypertension dyslipidemi,a  presenting today with rectal bleeding.  Patient has had multiple bright red stools.  Patient notes some of it is maroon in color or bright red.  No black.  Patient has no abdominal pain, no rectal pain.  Felt only mildly weak.   9:35 PM Suspect diverticular bleed.  Patient has no abdominal pain.  No hemorrhoids.  Is not black, bright red/maroon.  Will admit and GI consult in the morning.    Final Clinical Impressions(s) / ED Diagnoses   Final diagnoses:  None    ED Discharge Orders    None       Macarthur Critchley, MD 01/10/17 2158

## 2017-01-10 NOTE — ED Notes (Signed)
Report attempted 

## 2017-01-10 NOTE — ED Triage Notes (Signed)
Pt presents to ED for bloody bowel movements starting today.  Pt states bright red blood, small clots, and liquid stools x 4.  Pt denies abdominal pain, denies CP or SOB, denies light-headedness.   Pt denies a blood thinner.

## 2017-01-10 NOTE — H&P (Signed)
Date: 01/10/2017               Patient Name:  Kristi Alexander MRN: [id number]  DOB: Aug 12, 1925 Age / Sex: 83 y.o., female   PCP: Kristi Pile, MD         Medical Service: Internal Medicine Teaching Service         Attending Physician: Dr. Evette Alexander, Kristi Alexander, *    First Contact: Dr. Isac Alexander Pager: [phone]  Second Contact: Dr. Philipp Alexander Pager: [phone]       After Hours (After 5p/  First Contact Pager: [phone]  weekends / holidays): Second Contact Pager: [phone]   Chief Complaint: Rectal Bleeding  History of Present Illness: Kristi Alexander is and 83 yo F with a history of CHFpEF, HTN, Diabetes, Restless leg syndrome, and PAD who presents with GI bleed. She states she started noticing blood in her stool the day of admission and described it as bright red blood and maroon colored stool (without black stools). She states she has had 4 liquid stools. Pataint has chronic weakness and fatigue and is uncertain if she has been feeling increased fatigue. She denies fevers, chills, chest pain, dyspnea, lightheadedness, or abdonminal pain. She does endorse some bloating, which she states has started while in the ED.  In the ED, patient was hemodynamically stable. CBC showed Hgb 10.0 (down from 11.8 two months prioe); CMP showed BUN 32, Cr 1.22 (baseline 0.8); Fecal occult blood positive; CT Abdomen/Pelvis showed extensive diverticulosis. Patient to be admitted for further workup and treatment.   Meds:  Current Meds  Medication Sig  . acetaminophen (TYLENOL) 500 MG tablet Take 500 mg every 6 (six) hours as needed by mouth (pain).  Marland Kitchen amLODipine (NORVASC) 10 MG tablet TAKE 1 TABLET BY MOUTH ONCE DAILY (Patient taking differently: TAKE 1 TABLET (10 MG)  BY MOUTH ONCE DAILY)  . aspirin EC 81 MG tablet Take 81 mg daily by mouth.  Marland Kitchen b complex vitamins tablet Take 1 tablet by mouth daily.  . cetirizine (ZYRTEC) 10 MG tablet Take 10 mg daily as needed by mouth for allergies.  . ferrous  sulfate 325 (65 FE) MG tablet Take 325 mg daily with breakfast by mouth.  . fluticasone (FLONASE) 50 MCG/ACT nasal spray Place 1 spray daily as needed into both nostrils for allergies or rhinitis.  . hydrOXYzine (ATARAX/VISTARIL) 25 MG tablet TAKE ONE TABLET BY MOUTH ONCE DAILY AS NEEDED (Patient taking differently: TAKE ONE TABLET (25 MG) BY MOUTH DAILY AS NEEDED FOR ITCHING)  . isosorbide mononitrate (IMDUR) 30 MG 24 hr tablet TAKE ONE AND ONE-HALF TABLETS BY MOUTH ONCE DAILY (Patient taking differently: TAKE ONE AND ONE-HALF TABLETS (45 MG) BY MOUTH ONCE DAILY)  . losartan (COZAAR) 100 MG tablet Take 1 tablet (100 mg total) by mouth daily.  . metoprolol succinate (TOPROL-XL) 25 MG 24 hr tablet TAKE ONE TABLET BY MOUTH ONCE DAILY (Patient taking differently: TAKE ONE TABLET (25 MG) BY MOUTH ONCE DAILY)  . naproxen sodium (ALEVE) 220 MG tablet Take 440 mg 2 (two) times daily as needed by mouth (pain).  . Vitamin D, Ergocalciferol, (DRISDOL) 50000 units CAPS capsule Take 50,000 Units every Sunday by mouth.     Allergies: Allergies as of 01/10/2017 - Review Complete 01/10/2017  Allergen Reaction Noted  . Penicillins Swelling 07/13/2012  . Pravastatin Other (See Comments) 11/03/2016   Past Medical History:  Diagnosis Date  . Abnormality of gait 11/22/2014  . Arthritis   . Cyst of breast, right,  benign solitary   . Dyslipidemia   . H/O: hysterectomy   . Heart murmur   . History of appendectomy   . History of lumbosacral spine surgery   . Hypertension   . Inguinal hernia   . Lumbar stenosis   . Restless leg syndrome     Family History:  Family History  Problem Relation Age of Onset  . Heart disease Mother   . Diabetes Mother   . Heart attack Father   . Lung cancer Brother   . Healthy Sister   . Stomach cancer Son   - Reviewed on Admission  Social History:  Social History   Tobacco Use  . Smoking status: Former Smoker    Packs/day: 0.00    Types: Cigarettes    Last  attempt to quit: 02/25/2003    Years since quitting: 13.8  . Smokeless tobacco: Never Used  Substance Use Topics  . Alcohol use: Yes    Comment: About 2 drinks per night  . Drug use: No  - Reviewed on Admission  Review of Systems: A complete ROS was negative except as per HPI.   Physical Exam: Blood pressure 112/62, pulse 72, temperature 98.1 F (36.7 C), temperature source Oral, resp. rate 18, SpO2 99 %. Physical Exam  Constitutional: She is oriented to person, place, and time. She appears well-developed and well-nourished.  HENT:  Head: Normocephalic and atraumatic.  Eyes: EOM are normal. Right eye exhibits no discharge. Left eye exhibits no discharge.  Cardiovascular: Normal rate, regular rhythm, normal heart sounds and intact distal pulses.  Pulmonary/Chest: Effort normal. No respiratory distress.  Bibasilar Rales  Abdominal: Soft. Bowel sounds are normal. She exhibits no distension. There is no tenderness.  Musculoskeletal: She exhibits no deformity.  Trace Bilateral LE Edema  Neurological: She is alert and oriented to person, place, and time.  Skin: Skin is warm and dry.  Psychiatric: She has a normal mood and affect.    CT Abdomen Pelvis W Contrast: IMPRESSION: 1. Potential etiology for the patient's hematochezia may be secondary to the extensive colonic diverticulosis noted. No acute bowel inflammation nor mechanical obstruction is identified. 2. Short segment of gas-filled small bowel dilatation about the small bowel anastomotic sutures may represent a short segment of atonic small bowel or area of focal dysmotility. 3. Uncomplicated cholelithiasis. 4. 8 mm cyst in the right hepatic lobe, stable in appearance. 5. Stable left adrenal nodules, the larger of which measures approximate 1.7 cm. The smaller is 0.5 cm at the apex. 6. Small fat containing left inguinal hernia. 7. Lumbar spondylosis. 8. Interstitial pulmonary fibrosis at the lung bases.  Assessment & Plan by  Problem: Active Problems:   GI bleed  AKI GI Bleed: Patient with 1 day of bloody stools. 4 bloody, liquid bowl movents today. Hgb is 10.0, down from 11.8 2 months ago. Heme positive. Mild AKI with Cr 1.22 from baseline of 0.8 in the setting of this bloody diarrhea. Diverticular bleed suspected given sudden onset of painless bright red blood and maroon stools with diverticulosis on CT. Patient currently asymptomatic will transfuse if patient becomes symptomatic or Hgb <7. - CBC and CMP in AM - IVF 75cc/hr x 12hrs - NPO   CHF: Bibasilar rales on exam, however patient denies dyspnea and has mild AKI. - Continue home losartan 100mg  Daily - Continue home Metoprolol 25mg  Daily - Continue home IMDUR 30mg  Daily   Hypertension: Normotensive thus far.  - Continue home losartan 100mg  Daily - Continue home Metoprolol 25mg  Daily  PAD: Home ASA 81mg  Daily - Holding ASA in the setting of bleed.  FEN: NPO, IVF 75cc/hr x 12hrs DVT Prophylaxis: SCDs Code Status: Full  Dispo: Admit patient to Observation with expected length of stay less than 2 midnights.  Signed: Neva Seat, MD 01/10/2017, 11:43 PM  Pager: [phone]

## 2017-01-10 NOTE — ED Notes (Signed)
Patient transported to CT 

## 2017-01-11 DIAGNOSIS — Z87891 Personal history of nicotine dependence: Secondary | ICD-10-CM

## 2017-01-11 DIAGNOSIS — M810 Age-related osteoporosis without current pathological fracture: Secondary | ICD-10-CM | POA: Diagnosis not present

## 2017-01-11 DIAGNOSIS — I272 Pulmonary hypertension, unspecified: Secondary | ICD-10-CM | POA: Diagnosis not present

## 2017-01-11 DIAGNOSIS — Z833 Family history of diabetes mellitus: Secondary | ICD-10-CM

## 2017-01-11 DIAGNOSIS — G2581 Restless legs syndrome: Secondary | ICD-10-CM

## 2017-01-11 DIAGNOSIS — E559 Vitamin D deficiency, unspecified: Secondary | ICD-10-CM | POA: Diagnosis not present

## 2017-01-11 DIAGNOSIS — G8929 Other chronic pain: Secondary | ICD-10-CM | POA: Diagnosis not present

## 2017-01-11 DIAGNOSIS — Z86010 Personal history of colonic polyps: Secondary | ICD-10-CM | POA: Diagnosis not present

## 2017-01-11 DIAGNOSIS — Z880 Allergy status to penicillin: Secondary | ICD-10-CM

## 2017-01-11 DIAGNOSIS — K625 Hemorrhage of anus and rectum: Secondary | ICD-10-CM | POA: Diagnosis not present

## 2017-01-11 DIAGNOSIS — Z7982 Long term (current) use of aspirin: Secondary | ICD-10-CM

## 2017-01-11 DIAGNOSIS — I110 Hypertensive heart disease with heart failure: Secondary | ICD-10-CM

## 2017-01-11 DIAGNOSIS — M545 Low back pain: Secondary | ICD-10-CM

## 2017-01-11 DIAGNOSIS — Z8719 Personal history of other diseases of the digestive system: Secondary | ICD-10-CM

## 2017-01-11 DIAGNOSIS — Z8249 Family history of ischemic heart disease and other diseases of the circulatory system: Secondary | ICD-10-CM

## 2017-01-11 DIAGNOSIS — K5731 Diverticulosis of large intestine without perforation or abscess with bleeding: Secondary | ICD-10-CM | POA: Diagnosis not present

## 2017-01-11 DIAGNOSIS — Z79899 Other long term (current) drug therapy: Secondary | ICD-10-CM | POA: Diagnosis not present

## 2017-01-11 DIAGNOSIS — I5032 Chronic diastolic (congestive) heart failure: Secondary | ICD-10-CM | POA: Diagnosis not present

## 2017-01-11 DIAGNOSIS — K648 Other hemorrhoids: Secondary | ICD-10-CM | POA: Diagnosis not present

## 2017-01-11 DIAGNOSIS — Z888 Allergy status to other drugs, medicaments and biological substances status: Secondary | ICD-10-CM

## 2017-01-11 DIAGNOSIS — Z801 Family history of malignant neoplasm of trachea, bronchus and lung: Secondary | ICD-10-CM

## 2017-01-11 DIAGNOSIS — R0989 Other specified symptoms and signs involving the circulatory and respiratory systems: Secondary | ICD-10-CM | POA: Diagnosis not present

## 2017-01-11 DIAGNOSIS — E785 Hyperlipidemia, unspecified: Secondary | ICD-10-CM | POA: Diagnosis not present

## 2017-01-11 DIAGNOSIS — N179 Acute kidney failure, unspecified: Secondary | ICD-10-CM

## 2017-01-11 DIAGNOSIS — K922 Gastrointestinal hemorrhage, unspecified: Secondary | ICD-10-CM | POA: Diagnosis present

## 2017-01-11 DIAGNOSIS — Z800 Family history of malignant neoplasm of digestive organs: Secondary | ICD-10-CM

## 2017-01-11 DIAGNOSIS — D62 Acute posthemorrhagic anemia: Secondary | ICD-10-CM | POA: Diagnosis not present

## 2017-01-11 DIAGNOSIS — E1151 Type 2 diabetes mellitus with diabetic peripheral angiopathy without gangrene: Secondary | ICD-10-CM

## 2017-01-11 DIAGNOSIS — M48061 Spinal stenosis, lumbar region without neurogenic claudication: Secondary | ICD-10-CM | POA: Diagnosis not present

## 2017-01-11 LAB — CBC
HCT: 27.8 % — ABNORMAL LOW (ref 36.0–46.0)
HEMATOCRIT: 26 % — AB (ref 36.0–46.0)
HEMOGLOBIN: 8.6 g/dL — AB (ref 12.0–15.0)
Hemoglobin: 8.3 g/dL — ABNORMAL LOW (ref 12.0–15.0)
MCH: 28.2 pg (ref 26.0–34.0)
MCH: 27.7 pg (ref 26.0–34.0)
MCHC: 31.9 g/dL (ref 30.0–36.0)
MCHC: 30.9 g/dL (ref 30.0–36.0)
MCV: 88.4 fL (ref 78.0–100.0)
MCV: 89.4 fL (ref 78.0–100.0)
Platelets: 176 10*3/uL (ref 150–400)
Platelets: 208 10*3/uL (ref 150–400)
RBC: 2.94 MIL/uL — ABNORMAL LOW (ref 3.87–5.11)
RBC: 3.11 MIL/uL — ABNORMAL LOW (ref 3.87–5.11)
RDW: 18.1 % — AB (ref 11.5–15.5)
RDW: 18 % — ABNORMAL HIGH (ref 11.5–15.5)
WBC: 7.3 10*3/uL (ref 4.0–10.5)
WBC: 7.1 10*3/uL (ref 4.0–10.5)

## 2017-01-11 LAB — COMPREHENSIVE METABOLIC PANEL
ALBUMIN: 2.6 g/dL — AB (ref 3.5–5.0)
ALT: 8 U/L — AB (ref 14–54)
AST: 17 U/L (ref 15–41)
Alkaline Phosphatase: 62 U/L (ref 38–126)
Anion gap: 4 — ABNORMAL LOW (ref 5–15)
BUN: 35 mg/dL — AB (ref 6–20)
CHLORIDE: 115 mmol/L — AB (ref 101–111)
CO2: 23 mmol/L (ref 22–32)
CREATININE: 0.99 mg/dL (ref 0.44–1.00)
Calcium: 8.2 mg/dL — ABNORMAL LOW (ref 8.9–10.3)
GFR calc Af Amer: 57 mL/min — ABNORMAL LOW (ref >60)
GFR, EST NON AFRICAN AMERICAN: 49 mL/min — AB (ref >60)
Glucose, Bld: 110 mg/dL — ABNORMAL HIGH (ref 65–99)
POTASSIUM: 3.6 mmol/L (ref 3.5–5.1)
SODIUM: 142 mmol/L (ref 135–145)
Total Bilirubin: 0.5 mg/dL (ref 0.3–1.2)
Total Protein: 4.9 g/dL — ABNORMAL LOW (ref 6.5–8.1)

## 2017-01-11 NOTE — Progress Notes (Signed)
Pt admitted from ED per stretcher accompanied by nurse tech and pt daughter, on arrival to the floor pt was alert and oriented, ID varrified with pt, introduced to pt care equipment, fall assessment done pt has been encouraged to call for assistance when getting OOB, use a cane to walk which was brought from home by pt, vital signs are stable skin checked with another nurse no skin issues, prescribed treatment started and pt kept on bed alarm will continue to monitor

## 2017-01-11 NOTE — Consult Note (Signed)
Referring Provider:  Dr. Isac Sarna Primary Care Physician:  Maryellen Pile, MD Primary Gastroenterologist:  Dr. Michail Sermon  Reason for Consultation:  Painless hematochezia  HPI: Kristi Alexander is a 83 y.o. 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 GI is consulted for further evaluation.   Patient seen and examined and bedside. She started noticing rectal bleeding yesterday.had 3-4 bowel movements yesterday which were loose in consistency. Denied any associated abdominal pain, nausea or vomiting. Denied any chest pain or difficulty breathing. Denied any weight loss Her bowel movements are getting formed now. seeing  has blood in the stool now.  Last colonoscopy in 05/ 2013 by Dr. Michail Sermon  showed adenomatous polyp. Repeat colonoscopy was recommended in 5 years.   Past Medical History:  Diagnosis Date  . Abnormality of gait 11/22/2014  . Arthritis   . Cyst of breast, right, benign solitary   . Dyslipidemia   . H/O: hysterectomy   . Heart murmur   . History of appendectomy   . History of lumbosacral spine surgery   . Hypertension   . Inguinal hernia   . Lumbar stenosis   . Restless leg syndrome     Past Surgical History:  Procedure Laterality Date  . ABDOMINAL HYSTERECTOMY    . APPENDECTOMY    . BREAST CYST EXCISION     LEFT  . ESOPHAGOGASTRODUODENOSCOPY (EGD) N/A 04/01/2013   Performed by Doreen Salvage, MD at [facility]  . EXPLORATORY LAPAROTOMY N/A 03/23/2013   Performed by Doreen Salvage, MD at [facility]  . HERNIA REPAIR    . L4-5 DECOMPRESSION FOR RECURRENT STENOSIS N/A 11/10/2016   Performed by Marybelle Killings, MD at [place]  . PERCUTANEOUS ENDOSCOPIC GASTROSTOMY (PEG) PLACEMENT N/A 04/01/2013   Performed by Doreen Salvage, MD at [facility]  . SMALL BOWEL RESECTION N/A 03/23/2013    Performed by Doreen Salvage, MD at [place]     feinstein    Prior to Admission medications   Medication Sig Start Date End Date Taking? Authorizing Provider  acetaminophen (TYLENOL) 500 MG tablet Take 500 mg every 6 (six) hours as needed by mouth (pain).   Yes [provider]  amLODipine (NORVASC) 10 MG tablet TAKE 1 TABLET BY MOUTH ONCE DAILY Patient taking differently: TAKE 1 TABLET (10 MG)  BY MOUTH ONCE DAILY 09/11/16  Yes Maryellen Pile, MD  aspirin EC 81 MG tablet Take 81 mg daily by mouth.   Yes [provider]  b complex vitamins tablet Take 1 tablet by mouth daily.   Yes [provider]  cetirizine (ZYRTEC) 10 MG tablet Take 10 mg daily as needed by mouth for allergies.   Yes [provider]  ferrous sulfate 325 (65 FE) MG tablet Take 325 mg daily with breakfast by mouth.   Yes [provider]  fluticasone (FLONASE) 50 MCG/ACT nasal spray Place 1 spray daily as needed into both nostrils for allergies or rhinitis.   Yes [provider]  hydrOXYzine (ATARAX/VISTARIL) 25 MG tablet TAKE ONE TABLET BY MOUTH ONCE DAILY AS NEEDED Patient taking differently: TAKE ONE TABLET (25 MG) BY MOUTH DAILY AS NEEDED FOR ITCHING 01/09/16  Yes Maryellen Pile, MD  isosorbide mononitrate (IMDUR) 30 MG 24 hr tablet TAKE ONE AND ONE-HALF TABLETS BY MOUTH ONCE DAILY Patient taking differently: TAKE ONE AND  ONE-HALF TABLETS (45 MG) BY MOUTH ONCE DAILY 07/22/16  Yes Maryellen Pile, MD  losartan (COZAAR) 100 MG tablet Take 1 tablet (100 mg total) by mouth daily. 06/20/16 06/20/17 Yes Maryellen Pile, MD  metoprolol succinate (TOPROL-XL) 25 MG 24 hr tablet TAKE ONE TABLET BY MOUTH ONCE DAILY Patient taking differently: TAKE ONE TABLET (25 MG) BY MOUTH ONCE DAILY 04/21/16  Yes Maryellen Pile, MD  naproxen sodium (ALEVE) 220 MG tablet Take 440 mg 2 (two) times daily as needed by mouth (pain).   Yes [provider]  Vitamin D, Ergocalciferol,  (DRISDOL) 50000 units CAPS capsule Take 50,000 Units every Sunday by mouth.   Yes [provider]    Scheduled Meds: . amLODipine  10 mg Oral Daily  . iopamidol      . isosorbide mononitrate  45 mg Oral Daily  . metoprolol succinate  25 mg Oral Daily   Continuous Infusions: . sodium chloride 75 mL/hr at 01/11/17 0024   PRN Meds:.hydrOXYzine  Allergies as of 01/10/2017 - Review Complete 01/10/2017  Allergen Reaction Noted  . Penicillins Swelling 07/13/2012  . Pravastatin Other (See Comments) 11/03/2016    Family History  Problem Relation Age of Onset  . Heart disease Mother   . Diabetes Mother   . Heart attack Father   . Lung cancer Brother   . Healthy Sister   . Stomach cancer Son     Social History   Socioeconomic History  . Marital status: Married    Spouse name: Kristi Alexander   . Number of children: 2  . Years of education: 12+  . Highest education level: Not on file  Social Needs  . Financial resource strain: Not on file  . Food insecurity - worry: Not on file  . Food insecurity - inability: Not on file  . Transportation needs - medical: Not on file  . Transportation needs - non-medical: Not on file  Occupational History  . Occupation: retired  Tobacco Use  . Smoking status: Former Smoker    Packs/day: 0.00    Types: Cigarettes    Last attempt to quit: 02/25/2003    Years since quitting: 13.8  . Smokeless tobacco: Never Used  Substance and Sexual Activity  . Alcohol use: Yes    Comment: About 2 drinks per night  . Drug use: No  . Sexual activity: Not on file  Other Topics Concern  . Not on file  Social History Narrative   Patient is widowed and lives with her daugther. The patient is retired. Patient has 2 children and has a college education.    Patient is right handed.   Patient drinks very little caffeine.    Review of Systems: Review of Systems  Constitutional: Negative for chills, fever, malaise/fatigue and weight loss.  HENT: Negative for  hearing loss and tinnitus.   Eyes: Negative for blurred vision and double vision.  Respiratory: Negative for cough, hemoptysis and sputum production.   Cardiovascular: Negative for chest pain and palpitations.  Gastrointestinal: Positive for blood in stool. Negative for abdominal pain, constipation, diarrhea, heartburn, melena, nausea and vomiting.  Genitourinary: Negative for dysuria and urgency.  Musculoskeletal: Negative for myalgias and neck pain.  Skin: Negative for itching and rash.  Neurological: Negative for focal weakness and seizures.  Endo/Heme/Allergies: Does not bruise/bleed easily.  Psychiatric/Behavioral: Negative for hallucinations and suicidal ideas.    Physical Exam: Vital signs: Vitals:   01/11/17 0528 01/11/17 0905  BP: (!) 118/50 (!) 157/54  Pulse: 72 66  Resp:  19   Temp: 98.2 F (36.8 C)   SpO2: 99%    Last BM Date: 01/11/17 Physical Exam  Constitutional: She is oriented to person, place, and time. She appears well-developed and well-nourished. No distress.  HENT:  Head: Normocephalic and atraumatic.  Mouth/Throat: Oropharynx is clear and moist. No oropharyngeal exudate.  Eyes: EOM are normal. No scleral icterus.  Neck: Normal range of motion. Neck supple. No thyromegaly present.  Cardiovascular: Normal rate, regular rhythm and normal heart sounds.  Pulmonary/Chest: Effort normal and breath sounds normal. No respiratory distress.  Abdominal: Soft. Bowel sounds are normal. She exhibits no distension. There is no tenderness. There is no rebound and no guarding.  Scar marks from previous surgeries noted  Musculoskeletal: Normal range of motion. She exhibits no edema.  Neurological: She is alert and oriented to person, place, and time.  Skin: Skin is warm. No erythema.  Psychiatric: She has a normal mood and affect. Her behavior is normal.  Vitals reviewed.   GI:  Lab Results: Recent Labs    01/10/17 1821 01/11/17 0254  WBC 8.8 7.3  HGB 10.0* 8.3*   HCT 31.6* 26.0*  PLT 252 176   BMET Recent Labs    01/10/17 1821 01/11/17 0254  NA 141 142  K 4.3 3.6  CL 114* 115*  CO2 23 23  GLUCOSE 128* 110*  BUN 32* 35*  CREATININE 1.22* 0.99  CALCIUM 8.7* 8.2*   LFT Recent Labs    01/11/17 0254  PROT 4.9*  ALBUMIN 2.6*  AST 17  ALT 8*  ALKPHOS 62  BILITOT 0.5   PT/INR No results for input(s): LABPROT, INR in the last 72 hours.   Studies/Results: Ct Abdomen Pelvis W Contrast  Result Date: 01/10/2017 CLINICAL DATA:  Dark blood in her stool this morning. EXAM: CT ABDOMEN AND PELVIS WITH CONTRAST TECHNIQUE: Multidetector CT imaging of the abdomen and pelvis was performed using the standard protocol following bolus administration of intravenous contrast. CONTRAST:  75 cc ISOVUE-300 IOPAMIDOL (ISOVUE-300) INJECTION 61% COMPARISON:  04/07/2013 FINDINGS: Lower chest: Subpleural fibrosis at each lung base with honeycomb appearance of the lungs. No significant effusion or pneumothorax. No pulmonary consolidation. Stable cardiomegaly without pericardial effusion. The thoracic aorta is tortuous in appearance without aneurysm. There is atherosclerosis. Hepatobiliary: 8 mm water attenuating cyst in the right hepatic lobe, stable in retrospect to prior. No enhancing mass or biliary dilatation. Partially calcified 2.7 x 1.6 x 1.1 cm lamellated gallstone without secondary signs of acute cholecystitis. Pancreas: Normal Spleen: Normal Adrenals/Urinary Tract: 1.7 cm left adrenal nodule is slightly smaller in appearance to prior, likely representing a benign finding. Smaller nodular the left adrenal apex measuring 0.5 cm is also stable. The right adrenal gland and both kidneys are nonacute. No obstructive uropathy. The urinary bladder is unremarkable. Stomach/Bowel: The stomach is decompressed in appearance. There is normal small bowel rotation without small-bowel obstruction. There is right-sided colonic diverticulosis in addition to more extensive mid to  distal descending and sigmoid colonic diverticulosis. Status post partial small bowel resection with gas-filled dilatation of a short segment of small bowel deep to the umbilicus. No annular constricting lesions are seen. Appendectomy by report. No annular constricting lesions. Vascular/Lymphatic: Moderate aortoiliac and branch vessel atherosclerosis. No aneurysm or dissection. No lymphadenopathy. Reproductive: Status post hysterectomy. No adnexal masses. Other: Small fat containing left inguinal hernia. Musculoskeletal: Extensive lumbar spondylosis with moderate-to-marked degenerative disc disease L1 through S1. Evidence of prior lumbar spinal decompression surgery L4-5. Multilevel degenerative facet arthropathy. IMPRESSION: 1.  Potential etiology for the patient's hematochezia may be secondary to the extensive colonic diverticulosis noted. No acute bowel inflammation nor mechanical obstruction is identified. 2. Short segment of gas-filled small bowel dilatation about the small bowel anastomotic sutures may represent a short segment of atonic small bowel or area of focal dysmotility. 3. Uncomplicated cholelithiasis. 4. 8 mm cyst in the right hepatic lobe, stable in appearance. 5. Stable left adrenal nodules, the larger of which measures approximate 1.7 cm. The smaller is 0.5 cm at the apex. 6. Small fat containing left inguinal hernia. 7. Lumbar spondylosis. 8. Interstitial pulmonary fibrosis at the lung bases. Electronically Signed   By: Ashley Royalty M.D.   On: 01/10/2017 23:59    Impression/Plan: - painless hematochezia. Most likely diverticular bleed. - acute blood loss anemia - H/O  small bowel infarction status post extremity laparotomy and small bowel resection in 2015.  Recommendations ----------------------- - continue supportive care for now. - Transfuse as needed to keep hemoglobin around 7-8 - patient's bleeding appears to be resolving.if she continues to have evidence of active bleeding, or  drop in hemoglobin recommend bleeding scan for further evaluation. A bleeding scan positive, similar need interventional radiology guided embolization - if bleeding scan is negative, and if patient continues to have a evidence of ongoing bleeding, then we'll consider colonoscopy. [facility] to follow tomorrow    LOS: 0 days   Otis Brace  MD, FACP 01/11/2017, 9:42 AM  Contact #  [phone]

## 2017-01-11 NOTE — Progress Notes (Signed)
Pt pooped twice since she came for admission last night it contains dark red blood

## 2017-01-11 NOTE — Progress Notes (Addendum)
   Subjective:  No acute events overnight.  Patient reports one bowel movement at 6 AM this morning with dark red blood stool. She otherwise feels well.  Agreeable to colonoscopy for further evaluation of GI bleeding.  Objective:  Vital signs in last 24 hours: Vitals:   01/10/17 2342 01/11/17 0017 01/11/17 0528 01/11/17 0905  BP: 112/62 (!) 121/54 (!) 118/50 (!) 157/54  Pulse: 72 65 72 66  Resp: 18 19 19    Temp: 98.1 F (36.7 C) 98.6 F (37 C) 98.2 F (36.8 C)   TempSrc: Oral Oral Oral   SpO2: 99% 100% 99%    General: Very pleasant female, well-nourished, well-developed, sleeping in bed in no acute distress Cardiac: regular rate and rhythm, nl S1/S2, no murmurs, rubs or gallops  Pulm: Appears comfortable on room air, no increased work of breathing  Abd: soft, NTND, normoactive bowel sounds, large well-healed midline scar from small bowel resection 3 years ago  Neuro: A&Ox3, able to move all four extremities Ext: warm and well perfused, no peripheral edema    Assessment/Plan:  Kristi Alexander is and 83 yo F with a history of HFpEF (EF 50%, G1DD), HTN, T2DM, restless leg syndrome, and PAD who presents with BRBPR, anemia, and extensive colonic diverticulosis consistent with lower GI bleed.   # GI Bleed: Likely a diverticular bleed. Last BM this AM with dark red blood. Hemodynamically stable. Hgb 10-->8.3 thought to be dilutional in the setting of IV hydration.  Repeat CBC in the afternoon. Will continue to monitor for active bleeding and transfuse if signs/symptoms of active bleeding or Hgb <7. GI consulted for colonoscopy.  - GI consult, appreciate recommendations  - Clear liquid diet   - F/u CBC @1500   # AKI: Resolved   # HFpEF: EF 50% and G1DD on 2016.  Appears euvolemic on exam.  - Continue home losartan 100mg  Daily + Metoprolol 25mg  Daily +  IMDUR 30mg  Daily   # HTN: Normotensive.  We will continue to monitor her blood pressure in the setting of GI bleed. - Continue home  medications as above   # PAD, RLE: ABIs on 07/2016 with L ABI 1.10 and R ABI PAD.  Per vascular SR[facility] note 6/15, medically managed.  - Holding ASA in the setting of GI bleed  F: NS @ 75 cc/hr  E: monitoring  N: CL diet   VTE ppx: SCDs   Code Status: Full code    Dispo: Anticipated discharge in approximately 1-2  day(s).   Welford Roche, MD 01/11/2017, 9:08 AM Pager: [phone]

## 2017-01-12 ENCOUNTER — Ambulatory Visit: Payer: Medicare Other | Admitting: Physical Therapy

## 2017-01-12 DIAGNOSIS — Z888 Allergy status to other drugs, medicaments and biological substances status: Secondary | ICD-10-CM | POA: Diagnosis not present

## 2017-01-12 DIAGNOSIS — I5032 Chronic diastolic (congestive) heart failure: Secondary | ICD-10-CM | POA: Diagnosis not present

## 2017-01-12 DIAGNOSIS — Z7982 Long term (current) use of aspirin: Secondary | ICD-10-CM | POA: Diagnosis not present

## 2017-01-12 DIAGNOSIS — K922 Gastrointestinal hemorrhage, unspecified: Secondary | ICD-10-CM | POA: Diagnosis not present

## 2017-01-12 DIAGNOSIS — K625 Hemorrhage of anus and rectum: Secondary | ICD-10-CM | POA: Diagnosis not present

## 2017-01-12 DIAGNOSIS — I110 Hypertensive heart disease with heart failure: Secondary | ICD-10-CM | POA: Diagnosis not present

## 2017-01-12 DIAGNOSIS — Z880 Allergy status to penicillin: Secondary | ICD-10-CM | POA: Diagnosis not present

## 2017-01-12 DIAGNOSIS — K5731 Diverticulosis of large intestine without perforation or abscess with bleeding: Secondary | ICD-10-CM | POA: Diagnosis not present

## 2017-01-12 DIAGNOSIS — D649 Anemia, unspecified: Secondary | ICD-10-CM

## 2017-01-12 DIAGNOSIS — G2581 Restless legs syndrome: Secondary | ICD-10-CM | POA: Diagnosis not present

## 2017-01-12 DIAGNOSIS — E1151 Type 2 diabetes mellitus with diabetic peripheral angiopathy without gangrene: Secondary | ICD-10-CM | POA: Diagnosis not present

## 2017-01-12 LAB — CBC
HCT: 29 % — ABNORMAL LOW (ref 36.0–46.0)
HEMOGLOBIN: 9.2 g/dL — AB (ref 12.0–15.0)
MCH: 28.3 pg (ref 26.0–34.0)
MCHC: 31.7 g/dL (ref 30.0–36.0)
MCV: 89.2 fL (ref 78.0–100.0)
Platelets: 199 10*3/uL (ref 150–400)
RBC: 3.25 MIL/uL — AB (ref 3.87–5.11)
RDW: 18 % — ABNORMAL HIGH (ref 11.5–15.5)
WBC: 5.2 10*3/uL (ref 4.0–10.5)

## 2017-01-12 LAB — GLUCOSE, CAPILLARY: GLUCOSE-CAPILLARY: 94 mg/dL (ref 65–99)

## 2017-01-12 MED ORDER — PSYLLIUM 95 % PO PACK
1.0000 | PACK | Freq: Every day | ORAL | Status: DC
  Administered 2017-01-12: 1 via ORAL
  Filled 2017-01-12: qty 1

## 2017-01-12 MED ORDER — ACETAMINOPHEN 500 MG PO TABS
1000.0000 mg | ORAL_TABLET | Freq: Three times a day (TID) | ORAL | Status: DC | PRN
  Administered 2017-01-12: 1000 mg via ORAL
  Filled 2017-01-12: qty 2

## 2017-01-12 MED ORDER — PSYLLIUM 0.52 G PO CAPS: 1.0000 g | ORAL_CAPSULE | Freq: Every day | ORAL | 2 refills | Status: DC

## 2017-01-12 NOTE — Progress Notes (Signed)
Kristi Alexander to be D/C'd Home per MD order.  Discussed with the patient and all questions fully answered.  VSS, Skin clean, dry and intact without evidence of skin break down, no evidence of skin tears noted. IV catheter discontinued intact. Site without signs and symptoms of complications. Dressing and pressure applied.  An After Visit Summary was printed and given to the patient. Patient received prescription.  D/c education completed with patient/family including follow up instructions, medication list, d/c activities limitations if indicated, with other d/c instructions as indicated by MD - patient able to verbalize understanding, all questions fully answered.   Patient instructed to return to ED, call 911, or call MD for any changes in condition.   Patient escorted via [place], and D/C home via private auto.  [name] 01/12/2017 2:32 PM

## 2017-01-12 NOTE — Progress Notes (Signed)
   Subjective:  No acute events overnight.  Feeling well and eager to go home. No BMs since yesterday.   Objective:  Vital signs in last 24 hours: Vitals:   01/11/17 2114 01/12/17 0443 01/12/17 0602 01/12/17 1001  BP: (!) 136/44 (!) 145/51 (!) 156/71 (!) 171/55  Pulse: 68 62 71 86  Resp: 18 14 16    Temp: 98.2 F (36.8 C) 98.2 F (36.8 C) 98.1 F (36.7 C)   TempSrc: Oral Oral Oral   SpO2: 96% 97% 98%    General: Very pleasant female, well-nourished, well-developed, sleeping in bed in no acute distress Cardiac: regular rate and rhythm, nl S1/S2, no murmurs, rubs or gallops  Pulm: Appears comfortable on room air, no increased work of breathing  Abd: soft, NTND, normoactive bowel sounds, large well-healed midline scar from small bowel resection 3 years ago  Neuro: A&Ox3, able to move all four extremities Ext: warm and well perfused, no peripheral edema    Assessment/Plan:  Ms. Metzger is and 83 yo F with a history of HFpEF (EF 50%, G1DD), HTN, T2DM, restless leg syndrome, and PAD who presents with BRBPR, anemia, and extensive colonic diverticulosis consistent with lower GI bleed.   # GI Bleed: Likely a diverticular bleed that has spontaneously resolved. Last BM yesterday. Hemodynamically stable. Hgb 8.3 -->9.2. Medically stable for discharge. Has GI and [facility] outpatient follow up scheduled.  - GI consult, appreciate recommendations  - Regular diet  - D/c home today   # HFpEF: EF 50% and G1DD on 2016.  Appears euvolemic on exam.  - Continue home losartan 100mg  Daily + Metoprolol 25mg  Daily +  IMDUR 30mg  Daily   # HTN: Normotensive.   - Continue home medications as above   # PAD, RLE: ABIs on 07/2016 with L ABI 1.10 and R ABI PAD.  Per vascular SRGY note 6/15, medically managed.  - Holding ASA in the setting of GI bleed.  Patient was started on ASA on 2012 but no documentation for indication. She has no history of heart disease or stroke, therefore not for secondary prevention.  Appears she is on it for primary prevention. Given that she has a history of GI bleeds in the past will not resume ASA as risks outweight benefit of primary prevention.   F: none  E: monitoring  N: CL diet   VTE ppx: SCDs   Code Status: Full code    Dispo: Anticipated discharge in approximately 1-2  day(s).   Welford Roche, MD 01/12/2017, 10:43 AM Pager: [phone]

## 2017-01-12 NOTE — Progress Notes (Signed)
Pt  C/O pain in the lt leg rating 8/10 paged Dr Hazle Coca from internal medicine teaching service put an order for PO tylenol, med given will continue to monitor

## 2017-01-12 NOTE — Discharge Summary (Signed)
Name: Kristi Alexander MRN: [id number] DOB: Dec 28, 1925 83 y.o. PCP: Maryellen Pile, MD  Date of Admission: 01/10/2017  7:48 PM Date of Discharge: 01/12/2017 Attending Physician: Axel Filler, *  Discharge Diagnosis: 1. Lower GI bleed   Active Problems:   GI bleed   Discharge Medications: Allergies as of 01/12/2017      Reactions   Penicillins Swelling   Has patient had a PCN reaction causing immediate rash, facial/tongue/throat swelling, SOB or lightheadedness with hypotension: Yes Has patient had a PCN reaction causing severe rash involving mucus membranes or skin necrosis: No Has patient had a PCN reaction that required hospitalization: No Has patient had a PCN reaction occurring within the last 10 years: No If all of the above answers are "NO", then may proceed with Cephalosporin use.   Pravastatin Other (See Comments)   Cramps       Medication List    STOP taking these medications   aspirin EC 81 MG tablet   naproxen sodium 220 MG tablet Commonly known as:  ALEVE     TAKE these medications   acetaminophen 500 MG tablet Commonly known as:  TYLENOL Take 500 mg every 6 (six) hours as needed by mouth (pain).   amLODipine 10 MG tablet Commonly known as:  NORVASC TAKE 1 TABLET BY MOUTH ONCE DAILY What changed:    how much to take  how to take this  when to take this   b complex vitamins tablet Take 1 tablet by mouth daily.   cetirizine 10 MG tablet Commonly known as:  ZYRTEC Take 10 mg daily as needed by mouth for allergies.   ferrous sulfate 325 (65 FE) MG tablet Take 325 mg daily with breakfast by mouth.   fluticasone 50 MCG/ACT nasal spray Commonly known as:  FLONASE Place 1 spray daily as needed into both nostrils for allergies or rhinitis.   hydrOXYzine 25 MG tablet Commonly known as:  ATARAX/VISTARIL TAKE ONE TABLET BY MOUTH ONCE DAILY AS NEEDED What changed:  See the new instructions.   isosorbide mononitrate 30 MG 24 hr  tablet Commonly known as:  IMDUR TAKE ONE AND ONE-HALF TABLETS BY MOUTH ONCE DAILY What changed:    how much to take  how to take this  when to take this   losartan 100 MG tablet Commonly known as:  COZAAR Take 1 tablet (100 mg total) by mouth daily.   metoprolol succinate 25 MG 24 hr tablet Commonly known as:  TOPROL-XL TAKE ONE TABLET BY MOUTH ONCE DAILY What changed:    how much to take  how to take this  when to take this   psyllium 0.52 g capsule Commonly known as:  METAMUCIL Take 2 capsules (1.04 g total) daily by mouth.   Vitamin D (Ergocalciferol) 50000 units Caps capsule Commonly known as:  DRISDOL Take 50,000 Units every Sunday by mouth.       Disposition and follow-up:   [name] was discharged from [facility] in Stable condition.  At the hospital follow up visit please address:  1.  Please assess for symptoms of anemia and ongoing lower GI bleed. Will need repeat CBC.   2.  Labs / imaging needed at time of follow-up: CBC  3.  Pending labs/ test needing follow-up: None  Follow-up Appointments: Follow-up Information    [place]. Go on 01/19/2017.   Why:  at 1:15 pm for hospital follow up. Please arrive 15 minutes early and  bring your medications with you. Thank you! Contact information: [address] [place] [facility] Course by problem list:  [name] is and 83 yo F with a history of HFpEF (EF 50%, G1DD), HTN, T2DM, restless leg syndrome, and PAD who presents with BRBPR, anemia, and extensive colonic diverticulosis consistent with lower GI bleed.   1. Lower GI bleed: Likely diverticular bleed. Patient presented with BRBPR and found to be anemic with Hgb 10 (nl at baseline). She was managed conservatively per GI recommendations. Did not require blood transfusions. Hgb 9.2 on day of discharge. Will need to follow up with GI for discussion of  elective colonoscopy for follow-up of tubular adenomas. Home ASA stopped as patient on it for primary prevention only and risks of continuing it outweigh the benefits.   Discharge Vitals:   BP (!) 171/55   Pulse 86   Temp 98.1 F (36.7 C) (Oral)   Resp 16   SpO2 98%   Pertinent Labs, Studies, and Procedures:   CT A/P 11/17: IMPRESSION: 1. Potential etiology for the patient's hematochezia may be secondary to the extensive colonic diverticulosis noted. No acute bowel inflammation nor mechanical obstruction is identified. 2. Short segment of gas-filled small bowel dilatation about the small bowel anastomotic sutures may represent a short segment of atonic small bowel or area of focal dysmotility. 3. Uncomplicated cholelithiasis. 4. 8 mm cyst in the right hepatic lobe, stable in appearance. 5. Stable left adrenal nodules, the larger of which measures approximate 1.7 cm. The smaller is 0.5 cm at the apex. 6. Small fat containing left inguinal hernia. 7. Lumbar spondylosis. 8. Interstitial pulmonary fibrosis at the lung bases.   Discharge Instructions: Discharge Instructions    Call MD for:  extreme fatigue   Complete by:  As directed    Call MD for:  persistant dizziness or light-headedness   Complete by:  As directed    Diet general   Complete by:  As directed    Increase activity slowly   Complete by:  As directed       Signed: Welford Roche, MD 01/12/2017, 11:41 AM   Pager: [phone]

## 2017-01-12 NOTE — Discharge Instructions (Addendum)
You were admitted to [facility] for rectal bleeding. The bleeding stopped on its own.   Please start taking metamucil 2 tablets daily.   Do not take NSAIDs (ibuprofen, aleve, advil, motrin, naproxen, BC or Goody powder). All of this medication can cause you to start bleeding again. Take Tylenol for pain.   You have a hospital follow-up appointment scheduled for 11/26 at 1:15pm.   You will need to follow up with your GI doctor as an outpatient to discuss elective colonoscopy.

## 2017-01-12 NOTE — Progress Notes (Signed)
Subjective: The patient was seen and examined at bedside. Has not had a bowel movement today. Last bowel movement yesterday reported as dark, small in amount. She has been able to tolerate clear liquid diet, denies abdominal pain, nausea or vomiting.  Objective: Vital signs in last 24 hours: Temp:  [97.8 F (36.6 C)-98.2 F (36.8 C)] 98.1 F (36.7 C) (11/19 0602) Pulse Rate:  [62-80] 71 (11/19 0602) Resp:  [14-18] 16 (11/19 0602) BP: (129-157)/(44-71) 156/71 (11/19 0602) SpO2:  [96 %-99 %] 98 % (11/19 0602) Weight change:  Last BM Date: 01/11/17  PE: Not in acute distress GENERAL: Mild pallor, no icterus ABDOMEN: Soft, nondistended, nontender, normoactive bowel sounds, no rigidity, no guarding or rebound tenderness EXTREMITIES: No edema  Lab Results: Results for orders placed or performed during the hospital encounter of 01/10/17 (from the past 48 hour(s))  Comprehensive metabolic panel     Status: Abnormal   Collection Time: 01/10/17  6:21 PM  Result Value Ref Range   Sodium 141 135 - 145 mmol/L   Potassium 4.3 3.5 - 5.1 mmol/L   Chloride 114 (H) 101 - 111 mmol/L   CO2 23 22 - 32 mmol/L   Glucose, Bld 128 (H) 65 - 99 mg/dL   BUN 32 (H) 6 - 20 mg/dL   Creatinine, Ser 1.22 (H) 0.44 - 1.00 mg/dL   Calcium 8.7 (L) 8.9 - 10.3 mg/dL   Total Protein 6.0 (L) 6.5 - 8.1 g/dL   Albumin 3.1 (L) 3.5 - 5.0 g/dL   AST 22 15 - 41 U/L   ALT 10 (L) 14 - 54 U/L   Alkaline Phosphatase 77 38 - 126 U/L   Total Bilirubin 0.4 0.3 - 1.2 mg/dL   GFR calc non Af Amer 38 (L) >60 mL/min   GFR calc Af Amer 44 (L) >60 mL/min    Comment: (NOTE) The eGFR has been calculated using the CKD EPI equation. This calculation has not been validated in all clinical situations. eGFR's persistently <60 mL/min signify possible Chronic Kidney Disease.    Anion gap 4 (L) 5 - 15  CBC     Status: Abnormal   Collection Time: 01/10/17  6:21 PM  Result Value Ref Range   WBC 8.8 4.0 - 10.5 K/uL   RBC 3.55 (L)  3.87 - 5.11 MIL/uL   Hemoglobin 10.0 (L) 12.0 - 15.0 g/dL   HCT 31.6 (L) 36.0 - 46.0 %   MCV 89.0 78.0 - 100.0 fL   MCH 28.2 26.0 - 34.0 pg   MCHC 31.6 30.0 - 36.0 g/dL   RDW 17.9 (H) 11.5 - 15.5 %   Platelets 252 150 - 400 K/uL  Type and screen [place]     Status: None   Collection Time: 01/10/17  6:22 PM  Result Value Ref Range   ABO/RH(D) O POS    Antibody Screen NEG    Sample Expiration 01/13/2017   POC occult blood, ED     Status: Abnormal   Collection Time: 01/10/17  9:49 PM  Result Value Ref Range   Fecal Occult Bld POSITIVE (A) NEGATIVE  Comprehensive metabolic panel     Status: Abnormal   Collection Time: 01/11/17  2:54 AM  Result Value Ref Range   Sodium 142 135 - 145 mmol/L   Potassium 3.6 3.5 - 5.1 mmol/L   Chloride 115 (H) 101 - 111 mmol/L   CO2 23 22 - 32 mmol/L   Glucose, Bld 110 (H) 65 - 99 mg/dL  BUN 35 (H) 6 - 20 mg/dL   Creatinine, Ser 0.99 0.44 - 1.00 mg/dL   Calcium 8.2 (L) 8.9 - 10.3 mg/dL   Total Protein 4.9 (L) 6.5 - 8.1 g/dL   Albumin 2.6 (L) 3.5 - 5.0 g/dL   AST 17 15 - 41 U/L   ALT 8 (L) 14 - 54 U/L   Alkaline Phosphatase 62 38 - 126 U/L   Total Bilirubin 0.5 0.3 - 1.2 mg/dL   GFR calc non Af Amer 49 (L) >60 mL/min   GFR calc Af Amer 57 (L) >60 mL/min    Comment: (NOTE) The eGFR has been calculated using the CKD EPI equation. This calculation has not been validated in all clinical situations. eGFR's persistently <60 mL/min signify possible Chronic Kidney Disease.    Anion gap 4 (L) 5 - 15  CBC     Status: Abnormal   Collection Time: 01/11/17  2:54 AM  Result Value Ref Range   WBC 7.3 4.0 - 10.5 K/uL   RBC 2.94 (L) 3.87 - 5.11 MIL/uL   Hemoglobin 8.3 (L) 12.0 - 15.0 g/dL   HCT 26.0 (L) 36.0 - 46.0 %   MCV 88.4 78.0 - 100.0 fL   MCH 28.2 26.0 - 34.0 pg   MCHC 31.9 30.0 - 36.0 g/dL   RDW 18.1 (H) 11.5 - 15.5 %   Platelets 176 150 - 400 K/uL  CBC     Status: Abnormal   Collection Time: 01/11/17 11:27 AM  Result  Value Ref Range   WBC 7.1 4.0 - 10.5 K/uL   RBC 3.11 (L) 3.87 - 5.11 MIL/uL   Hemoglobin 8.6 (L) 12.0 - 15.0 g/dL   HCT 27.8 (L) 36.0 - 46.0 %   MCV 89.4 78.0 - 100.0 fL   MCH 27.7 26.0 - 34.0 pg   MCHC 30.9 30.0 - 36.0 g/dL   RDW 18.0 (H) 11.5 - 15.5 %   Platelets 208 150 - 400 K/uL  CBC     Status: Abnormal   Collection Time: 01/12/17  3:09 AM  Result Value Ref Range   WBC 5.2 4.0 - 10.5 K/uL   RBC 3.25 (L) 3.87 - 5.11 MIL/uL   Hemoglobin 9.2 (L) 12.0 - 15.0 g/dL   HCT 29.0 (L) 36.0 - 46.0 %   MCV 89.2 78.0 - 100.0 fL   MCH 28.3 26.0 - 34.0 pg   MCHC 31.7 30.0 - 36.0 g/dL   RDW 18.0 (H) 11.5 - 15.5 %   Platelets 199 150 - 400 K/uL    Studies/Results: Ct Abdomen Pelvis W Contrast  Result Date: 01/10/2017 CLINICAL DATA:  Dark blood in her stool this morning. EXAM: CT ABDOMEN AND PELVIS WITH CONTRAST TECHNIQUE: Multidetector CT imaging of the abdomen and pelvis was performed using the standard protocol following bolus administration of intravenous contrast. CONTRAST:  75 cc ISOVUE-300 IOPAMIDOL (ISOVUE-300) INJECTION 61% COMPARISON:  04/07/2013 FINDINGS: Lower chest: Subpleural fibrosis at each lung base with honeycomb appearance of the lungs. No significant effusion or pneumothorax. No pulmonary consolidation. Stable cardiomegaly without pericardial effusion. The thoracic aorta is tortuous in appearance without aneurysm. There is atherosclerosis. Hepatobiliary: 8 mm water attenuating cyst in the right hepatic lobe, stable in retrospect to prior. No enhancing mass or biliary dilatation. Partially calcified 2.7 x 1.6 x 1.1 cm lamellated gallstone without secondary signs of acute cholecystitis. Pancreas: Normal Spleen: Normal Adrenals/Urinary Tract: 1.7 cm left adrenal nodule is slightly smaller in appearance to prior, likely representing a benign finding. Smaller  nodular the left adrenal apex measuring 0.5 cm is also stable. The right adrenal gland and both kidneys are nonacute. No  obstructive uropathy. The urinary bladder is unremarkable. Stomach/Bowel: The stomach is decompressed in appearance. There is normal small bowel rotation without small-bowel obstruction. There is right-sided colonic diverticulosis in addition to more extensive mid to distal descending and sigmoid colonic diverticulosis. Status post partial small bowel resection with gas-filled dilatation of a short segment of small bowel deep to the umbilicus. No annular constricting lesions are seen. Appendectomy by report. No annular constricting lesions. Vascular/Lymphatic: Moderate aortoiliac and branch vessel atherosclerosis. No aneurysm or dissection. No lymphadenopathy. Reproductive: Status post hysterectomy. No adnexal masses. Other: Small fat containing left inguinal hernia. Musculoskeletal: Extensive lumbar spondylosis with moderate-to-marked degenerative disc disease L1 through S1. Evidence of prior lumbar spinal decompression surgery L4-5. Multilevel degenerative facet arthropathy. IMPRESSION: 1. Potential etiology for the patient's hematochezia may be secondary to the extensive colonic diverticulosis noted. No acute bowel inflammation nor mechanical obstruction is identified. 2. Short segment of gas-filled small bowel dilatation about the small bowel anastomotic sutures may represent a short segment of atonic small bowel or area of focal dysmotility. 3. Uncomplicated cholelithiasis. 4. 8 mm cyst in the right hepatic lobe, stable in appearance. 5. Stable left adrenal nodules, the larger of which measures approximate 1.7 cm. The smaller is 0.5 cm at the apex. 6. Small fat containing left inguinal hernia. 7. Lumbar spondylosis. 8. Interstitial pulmonary fibrosis at the lung bases. Electronically Signed   By: Ashley Royalty M.D.   On: 01/10/2017 23:59    Medications: I have reviewed the patient's current medications.  Assessment: 1. Painless hematochezia 2. Extensive colonic diverticulosis 3. History of small bowel  infarction status post laparotomy and small bowel resection.  Plan: Hemoglobin has remained stable, 8.3/8.6/9.2 today morning. Patient remains hemodynamically stable, has not required blood transfusion. Recommend advance diet to regular, add Metamucil, to be continued as needed. If no further evidence of bleeding and if hemoglobin remains stable by today afternoon, okay to discharge from GI standpoint. Hematochezia likely secondary to diverticular bleeding, which appears to have resolved. Patient to follow up with Dr. Michail Sermon as an outpatient, had adenomatous polyp in 2013, needs an elective colonoscopy as an outpatient.   Ronnette Juniper 01/12/2017, 7:53 AM   Pager [phone] If no answer or after 5 PM call [phone]

## 2017-01-14 ENCOUNTER — Other Ambulatory Visit: Payer: Self-pay | Admitting: Internal Medicine

## 2017-01-14 ENCOUNTER — Encounter: Payer: Medicare Other | Admitting: Physical Therapy

## 2017-01-19 ENCOUNTER — Ambulatory Visit: Payer: Medicare Other | Admitting: Internal Medicine

## 2017-01-19 ENCOUNTER — Ambulatory Visit: Payer: Medicare Other | Admitting: Physical Therapy

## 2017-01-19 ENCOUNTER — Other Ambulatory Visit: Payer: Self-pay

## 2017-01-19 ENCOUNTER — Encounter: Payer: Self-pay | Admitting: Internal Medicine

## 2017-01-19 ENCOUNTER — Telehealth: Payer: Self-pay | Admitting: *Deleted

## 2017-01-19 VITALS — BP 127/72 | HR 80 | Temp 98.0°F | Ht 64.5 in | Wt 141.7 lb

## 2017-01-19 DIAGNOSIS — Z09 Encounter for follow-up examination after completed treatment for conditions other than malignant neoplasm: Secondary | ICD-10-CM | POA: Diagnosis not present

## 2017-01-19 DIAGNOSIS — K922 Gastrointestinal hemorrhage, unspecified: Secondary | ICD-10-CM

## 2017-01-19 DIAGNOSIS — Z8719 Personal history of other diseases of the digestive system: Secondary | ICD-10-CM

## 2017-01-19 NOTE — Progress Notes (Signed)
Internal Medicine Clinic Attending  Case discussed with Dr. Boswell at the time of the visit.  We reviewed the resident's history and exam and pertinent patient test results.  I agree with the assessment, diagnosis, and plan of care documented in the resident's note.  

## 2017-01-19 NOTE — Progress Notes (Signed)
   CC: HFU for GI bleed  HPI:  [name] is a 83 y.o. female with a past medical history listed below here today for follow up of her recent hospitalization from 11/17-11/19 for lower GI bleed.  [name] presented to the ED with complaints of bright red blood per rectum. She was found to be anemic with Hgb of 10 at that time (no history of anemia) and trended down to 8.3. Her vitals were normal throughout the hospitalization without any tachycardia or hypotension. She was managed conservatively without any blood transfusions or procedures. Her ASA 81 mg daily for primary prevention was discontinued. CT abdomen/pelvis was notable for extensive colonic diverticulosis but otherwise no acute abnormalities. Her bleeding resolved on its own. GI was consulted who recommended outpatient elective colonoscopy. Discharge Hgb was 9.2.  Today, she reports she is doing well. Denies any further bleeding. Denies any melena, hematochezia, or abdominal pain. She denies any weakness, shortness of breath, or chest pain. Does still report feeling fatigued and more worn out that normal. Reports she is not able to do as much at PT due to this. Reports she is taking her iron supplement and has stopped her ASA.   Past Medical History:  Diagnosis Date  . Abnormality of gait 11/22/2014  . Arthritis   . Cyst of breast, right, benign solitary   . Dyslipidemia   . H/O: hysterectomy   . Heart murmur   . History of appendectomy   . History of lumbosacral spine surgery   . Hypertension   . Inguinal hernia   . Lumbar stenosis   . Restless leg syndrome    Review of Systems:   Negative except as noted in HPI  Physical Exam:  Vitals:   01/19/17 1334  BP: 127/72  Pulse: 80  Temp: 98 F (36.7 C)  TempSrc: Oral  SpO2: 100%  Weight: 141 lb 11.2 oz (64.3 kg)  Height: 5' 4.5" (1.638 m)   Physical Exam  Constitutional: She is well-developed, well-nourished, and in no distress.  HENT:  Head: Normocephalic  and atraumatic.  Eyes: Conjunctivae are normal.  Cardiovascular: Normal rate, regular rhythm and normal heart sounds.  Normal cap refill  Pulmonary/Chest: Effort normal and breath sounds normal.  Abdominal: Soft. Bowel sounds are normal. She exhibits no distension. There is no tenderness.  Skin: Skin is warm and dry.  Psychiatric: Mood and affect normal.    Assessment & Plan:   See Encounters Tab for problem based charting.  Patient discussed with Dr. Daryll Drown

## 2017-01-19 NOTE — Telephone Encounter (Signed)
Notified front desk @ rehab services (will refer to MD note in epic- no need for VO).

## 2017-01-19 NOTE — Patient Instructions (Addendum)
FOLLOW-UP INSTRUCTIONS  I am sorry you were in the hospital. I am glad you are doing better. If you have any more bleeding please let us know. We can stop the aspirin like they told you in the hospital. Keep taking the Iron pills.   We will call you to have the appointment arranged with GI for your colonoscopy. Please follow up with me next month at our scheduled appointment.  When: 02/04/17 @ 1:15 PM For: Routine follow up What to bring: Your medications

## 2017-01-19 NOTE — Assessment & Plan Note (Addendum)
Kristi Alexander is doing well without any further episodes of melena or hematochezia since her discharge from the hospital on 11/17. Vital signs wnl today. She denies any chest pain, SOB, or weakness today. Still having some fatigue. No signs of active bleeding.   Will repeat CBC today (Hgb 9.3 at discharge) and put in referral to GI for elective colonoscopy which the patient agrees to today.

## 2017-01-19 NOTE — Telephone Encounter (Signed)
Kristi Alexander (PT) calling requesting clearance to do PT today. Patient was hospitalized & now back home & doing well. Said needs MD note to ok to resume PT. Will call for VO if you agree. Thanks!

## 2017-01-19 NOTE — Telephone Encounter (Signed)
She can do PT without any restrictions.   Thanks

## 2017-01-20 LAB — CBC
Hematocrit: 27.3 % — ABNORMAL LOW (ref 34.0–46.6)
Hemoglobin: 8.9 g/dL — ABNORMAL LOW (ref 11.1–15.9)
MCH: 28.8 pg (ref 26.6–33.0)
MCHC: 32.6 g/dL (ref 31.5–35.7)
MCV: 88 fL (ref 79–97)
PLATELETS: 342 10*3/uL (ref 150–379)
RBC: 3.09 x10E6/uL — AB (ref 3.77–5.28)
RDW: 17 % — AB (ref 12.3–15.4)
WBC: 5.7 10*3/uL (ref 3.4–10.8)

## 2017-01-21 ENCOUNTER — Ambulatory Visit: Payer: Medicare Other | Admitting: Physical Therapy

## 2017-01-21 ENCOUNTER — Encounter: Payer: Self-pay | Admitting: Physical Therapy

## 2017-01-21 DIAGNOSIS — M6281 Muscle weakness (generalized): Secondary | ICD-10-CM | POA: Diagnosis present

## 2017-01-21 DIAGNOSIS — M791 Myalgia, unspecified site: Secondary | ICD-10-CM

## 2017-01-21 DIAGNOSIS — R2689 Other abnormalities of gait and mobility: Secondary | ICD-10-CM | POA: Diagnosis present

## 2017-01-21 DIAGNOSIS — G8929 Other chronic pain: Secondary | ICD-10-CM | POA: Diagnosis present

## 2017-01-21 DIAGNOSIS — M5441 Lumbago with sciatica, right side: Secondary | ICD-10-CM | POA: Diagnosis not present

## 2017-01-21 NOTE — Therapy (Signed)
[place], [place], [address] Phone: [phone]   Fax:  [phone]  Physical Therapy Treatment  Patient Details  Name: Kristi Alexander MRN: [id number] Date of Birth: 23-May-1925 Referring Provider: Lorin Alexander   Encounter Date: 01/21/2017  PT End of Session - 01/21/17 1043    Visit Number  7    Number of Visits  12    Date for PT Re-Evaluation  02/03/17    PT Start Time  1017    PT Stop Time  1109    PT Time Calculation (min)  52 min    Activity Tolerance  Patient limited by fatigue    Behavior During Therapy  [facility] for tasks assessed/performed       Past Medical History:  Diagnosis Date  . Abnormality of gait 11/22/2014  . Arthritis   . Cyst of breast, right, benign solitary   . Dyslipidemia   . H/O: hysterectomy   . Heart murmur   . History of appendectomy   . History of lumbosacral spine surgery   . Hypertension   . Inguinal hernia   . Lumbar stenosis   . Restless leg syndrome     Past Surgical History:  Procedure Laterality Date  . ABDOMINAL HYSTERECTOMY    . APPENDECTOMY    . BOWEL RESECTION N/A 03/23/2013   Procedure: SMALL BOWEL RESECTION;  Surgeon: Kristi Ober, MD;  Location: [place];  Service: General;  Laterality: N/A;  . BREAST CYST EXCISION     LEFT  . ESOPHAGOGASTRODUODENOSCOPY N/A 04/01/2013   Procedure: ESOPHAGOGASTRODUODENOSCOPY (EGD);  Surgeon: Kristi Ober, MD;  Location: [place];  Service: General;  Laterality: N/A;  . HERNIA REPAIR    . LAPAROTOMY N/A 03/23/2013   Procedure: EXPLORATORY LAPAROTOMY;  Surgeon: Kristi Ober, MD;  Location: [place];  Service: General;  Laterality: N/A;  . LUMBAR LAMINECTOMY/DECOMPRESSION MICRODISCECTOMY N/A 11/10/2016   Procedure: L4-5 DECOMPRESSION FOR RECURRENT STENOSIS;  Surgeon: Kristi Killings, MD;  Location: [place];  Service: Orthopedics;  Laterality: N/A;  . PEG PLACEMENT N/A 04/01/2013   Procedure: PERCUTANEOUS ENDOSCOPIC GASTROSTOMY (PEG)  PLACEMENT;  Surgeon: Kristi Ober, MD;  Location: [place];  Service: General;  Laterality: N/A;  . TRACHEOSTOMY     Kristi Alexander    There were no vitals filed for this visit.  Subjective Assessment - 01/21/17 1021    Subjective  Patient was hospitalized for 2 days with bleeding. She is feeling tired, has been slowly getting back to normal activities.  the Dr. has cleared her to resume activity.  "My Rt. leg just feels so tight"    Currently in Pain?  Yes    Pain Score  1     Pain Location  Leg    Pain Orientation  Right    Pain Descriptors / Indicators  Aching;Sore    Pain Type  Chronic pain    Pain Onset  More than a month ago    Pain Frequency  Intermittent    Aggravating Factors   morning, overactivity , not doing enough    Pain Relieving Factors  heat, stretching, tylenol               [facility] Adult PT Treatment/Exercise - 01/21/17 0001      Lumbar Exercises: Stretches   Active Hamstring Stretch  3 reps;30 seconds    Single Knee to Chest Stretch  2 reps;30 seconds    Lower Trunk Rotation  10 seconds x 10  Lumbar Exercises: Aerobic   Stationary Bike  NuStep L5 Ue and LE for 5 min       Lumbar Exercises: Supine   Ab Set  10 reps    Bridge  10 reps      Lumbar Exercises: Sidelying   Clam  10 reps    Hip Abduction  10 reps      Knee/Hip Exercises: Seated   Long Arc Quad  Strengthening;Both;1 set;20 reps      Modalities   Modalities  Moist Heat      Moist Heat Therapy   Number Minutes Moist Heat  10 Minutes    Moist Heat Location  Lumbar Spine             PT Education - 01/21/17 1235    Education provided  Yes    Education Details  importance of doing HEP vs "just moving" her leg around     Person(s) Educated  Patient    Methods  Explanation    Comprehension  Verbalized understanding       PT Short Term Goals - 01/21/17 1044      PT SHORT TERM GOAL #1   Title  Pt will be I with initial HEP     Baseline  needs cues,  wants someone with  her to exercise    Status  On-going      PT SHORT TERM GOAL #2   Title  Pt will report improved flexibility in trunk and hips fo greater ease of ADLs    Status  On-going      PT SHORT TERM GOAL #3   Title  Pt will complete balance screen and set goal, if appropriate    Status  Achieved        PT Long Term Goals - 01/21/17 1044      PT LONG TERM GOAL #1   Title  Pt will be I with HEP as of last visit for continued strength and flexibiility.     Status  On-going      PT LONG TERM GOAL #2   Title  Pt will understand posture and body mechanics to avoid injury, flare up.     Status  On-going      PT LONG TERM GOAL #3   Title  Pt will be able to improve balance , Berg Score improved to 46/56    Status  Unable to assess      PT LONG TERM GOAL #4   Title  Pt will be able to dorsiflex Rt. ankle fully (+10 deg) for safe driving as allowed by MD.     Status  On-going      PT LONG TERM GOAL #5   Title  Pt will demo 4+/5 strength in hip abduction for improved gait and stability.     Status  On-going            Plan - 01/21/17 1044    Clinical Impression Statement  Patient is generally feeling weak due to recent hospitalization and lack of activity.  She has been doing exercises inconsistently (understandably). She will continue to benefit from PT to strengthen and improve overall mobility.     PT Next Visit Plan  NuStep, check HEP, MHP, begin posture and body mech , strengthen hips and core     PT Home Exercise Plan  basic back , Lumbar stab 2. , given supine hamstring and lateral hip/ITB stretch with strap.     Consulted and Agree with  Plan of Care  Patient;Family member/caregiver    Family Member Consulted  Daughter Kristi Alexander       Patient will benefit from skilled therapeutic intervention in order to improve the following deficits and impairments:  Decreased balance, Decreased range of motion, Impaired flexibility, Postural dysfunction, Difficulty walking, Decreased strength,  Increased fascial restricitons, Pain, Decreased endurance, Impaired sensation  Visit Diagnosis: Chronic bilateral low back pain with right-sided sciatica  Muscle weakness (generalized)  Myalgia  Other abnormalities of gait and mobility     Problem List Patient Active Problem List   Diagnosis Date Noted  . GI bleed 01/10/2017  . Onychomycosis 01/06/2017  . Fatigue 01/06/2017  . Lumbar stenosis 11/10/2016  . Atherosclerosis of native artery of both lower extremities with intermittent claudication ([place]) 08/08/2016  . Peripheral arterial disease ([place]) 08/04/2016  . Leg cramps 06/20/2016  . Vitamin D deficiency 11/28/2015  . Left hip pain 11/28/2015  . Osteoporosis 09/16/2015  . Pain in joint, shoulder region 09/16/2015  . Estrogen deficiency 03/14/2015  . Spinal stenosis of lumbar region 01/24/2015  . Myalgia 01/11/2015  . Preventative health care 12/11/2014  . Essential hypertension 12/11/2014  . Pulmonary hypertension ([place])   . Chronic cough   . Chronic diastolic heart failure ([place]) 05/15/2013  . Diabetes ([place]) 05/14/2013  . Other vitamin B12 deficiency anemia 07/13/2012  . Restless legs syndrome (RLS) 07/13/2012    Ryley Bachtel 01/21/2017, 12:38 PM  [facility] [address] [place], [place], [address] Phone: [phone]   Fax:  [phone]  Name: Kristi Alexander MRN: [id number] Date of Birth: [birth year]  Raeford Razor, PT 01/21/17 12:38 PM Phone: [phone] Fax: [phone]

## 2017-01-21 NOTE — Patient Instructions (Signed)
Hamstring Stretch, Reclined (Strap, Doorframe)    Lengthen bottom leg on floor. Extend top leg along edge of doorframe or press foot up into yoga strap. Hold for ___30 sec _each leg x 3 each leg  Copyright  VHI. All rights reserved.    THEN  LOWER YOUR LEG SLIGHTLY AND ACROSS YOUR BODY  You should feel the stretch along the Rt. Side of your thigh and lower leg.

## 2017-01-23 ENCOUNTER — Encounter: Payer: Self-pay | Admitting: Podiatry

## 2017-01-23 ENCOUNTER — Ambulatory Visit: Payer: Medicare Other | Admitting: Podiatry

## 2017-01-23 VITALS — BP 153/65 | HR 84

## 2017-01-23 DIAGNOSIS — M79674 Pain in right toe(s): Secondary | ICD-10-CM | POA: Diagnosis not present

## 2017-01-23 DIAGNOSIS — M79675 Pain in left toe(s): Secondary | ICD-10-CM | POA: Diagnosis not present

## 2017-01-23 DIAGNOSIS — E1159 Type 2 diabetes mellitus with other circulatory complications: Secondary | ICD-10-CM | POA: Diagnosis not present

## 2017-01-23 DIAGNOSIS — B351 Tinea unguium: Secondary | ICD-10-CM | POA: Diagnosis not present

## 2017-01-23 NOTE — Progress Notes (Signed)
   Subjective:    Patient ID: Kristi Alexander, female    DOB: 15-Jul-1925, 83 y.o.   MRN: [id number]  HPI this patient presents to the office with chief complaint of painful long thick nails.  Patient states she is unaware when the nails were done last.  She says these nails are painful walking and wearing her shoes.  She has previous diagnosis of peripheral arterial disease as well as diabetes.  She states she is unable to self treat.  She presents the office today for an evaluation and treatment of her long thick nails.      Review of Systems  All other systems reviewed and are negative.      Objective:   Physical Exam General Appearance  Alert, conversant and in no acute stress.  Vascular  Dorsalis pedis and posterior pulses are not  palpable  bilaterally.  Capillary return is diminished   Bilaterally. Cold feet noted  B/L.    Neurologic  Senn-Weinstein monofilament wire test within normal limits  bilaterally. Muscle power  Within normal limits bilaterally.  Nails Thick disfigured discolored nails with subungual debride bilaterally from hallux to fifth toes bilaterally. No evidence of bacterial infection or drainage bilaterally.  Orthopedic  No limitations of motion of motion feet bilaterally.  No crepitus or effusions noted.  No bony pathology or digital deformities noted.  HAV  B/L.  Skin  normotropic skin with no porokeratosis noted bilaterally.  No signs of infections or ulcers noted.          Assessment & Plan:  Onychomycosis  B/L  Diabetes with vascular disease.    IE  Debridement of long thick nails.   RTC 3 months.  Patient presents the office with an individual who seems to be a guardian.  She recommends that this patient be evaluated for her vague pain in both of her feet.  . She also requests x-rays to be taken of her bunions on both feet.  I told them that they need to make an appointment with one of the doctors for x-ray evaluation and foot evaluation.  She is to make  an appointment with them soon.  This patient does appear to have significant PVD, which causes her to have leg cramps also.     Gardiner Barefoot DPM

## 2017-01-27 ENCOUNTER — Ambulatory Visit (INDEPENDENT_AMBULATORY_CARE_PROVIDER_SITE_OTHER): Payer: Medicare Other | Admitting: Orthopaedic Surgery

## 2017-01-28 ENCOUNTER — Encounter: Payer: Self-pay | Admitting: Physical Therapy

## 2017-01-28 ENCOUNTER — Ambulatory Visit: Payer: Medicare Other | Attending: Orthopaedic Surgery | Admitting: Physical Therapy

## 2017-01-28 DIAGNOSIS — R2689 Other abnormalities of gait and mobility: Secondary | ICD-10-CM

## 2017-01-28 DIAGNOSIS — G8929 Other chronic pain: Secondary | ICD-10-CM | POA: Diagnosis present

## 2017-01-28 DIAGNOSIS — M6281 Muscle weakness (generalized): Secondary | ICD-10-CM

## 2017-01-28 DIAGNOSIS — M5441 Lumbago with sciatica, right side: Secondary | ICD-10-CM | POA: Insufficient documentation

## 2017-01-28 DIAGNOSIS — M791 Myalgia, unspecified site: Secondary | ICD-10-CM

## 2017-01-28 NOTE — Therapy (Addendum)
[place], [place], [address] Phone: [phone]   Fax:  [phone]  Physical Therapy Treatment/Discharge  Patient Details  Name: Kristi Alexander MRN: [id number] Date of Birth: [birth year] Referring Provider: Lorin Mercy   Encounter Date: 01/28/2017  PT End of Session - 01/28/17 1249    Visit Number  8    Number of Visits  12    Date for PT Re-Evaluation  02/03/17    PT Start Time  0934    PT Stop Time  1017    PT Time Calculation (min)  43 min    Activity Tolerance  Patient tolerated treatment well    Behavior During Therapy  [facility] for tasks assessed/performed       Past Medical History:  Diagnosis Date  . Abnormality of gait 11/22/2014  . Arthritis   . Cyst of breast, right, benign solitary   . Dyslipidemia   . H/O: hysterectomy   . Heart murmur   . History of appendectomy   . History of lumbosacral spine surgery   . Hypertension   . Inguinal hernia   . Lumbar stenosis   . Restless leg syndrome     Past Surgical History:  Procedure Laterality Date  . ABDOMINAL HYSTERECTOMY    . APPENDECTOMY    . BOWEL RESECTION N/A 03/23/2013   Procedure: SMALL BOWEL RESECTION;  Surgeon: Gwenyth Ober, MD;  Location: [place];  Service: General;  Laterality: N/A;  . BREAST CYST EXCISION     LEFT  . ESOPHAGOGASTRODUODENOSCOPY N/A 04/01/2013   Procedure: ESOPHAGOGASTRODUODENOSCOPY (EGD);  Surgeon: Gwenyth Ober, MD;  Location: [place];  Service: General;  Laterality: N/A;  . HERNIA REPAIR    . LAPAROTOMY N/A 03/23/2013   Procedure: EXPLORATORY LAPAROTOMY;  Surgeon: Gwenyth Ober, MD;  Location: [place];  Service: General;  Laterality: N/A;  . LUMBAR LAMINECTOMY/DECOMPRESSION MICRODISCECTOMY N/A 11/10/2016   Procedure: L4-5 DECOMPRESSION FOR RECURRENT STENOSIS;  Surgeon: Marybelle Killings, MD;  Location: [place];  Service: Orthopedics;  Laterality: N/A;  . PEG PLACEMENT N/A 04/01/2013   Procedure: PERCUTANEOUS ENDOSCOPIC  GASTROSTOMY (PEG) PLACEMENT;  Surgeon: Gwenyth Ober, MD;  Location: [place];  Service: General;  Laterality: N/A;  . TRACHEOSTOMY     feinstein    There were no vitals filed for this visit.  Subjective Assessment - 01/28/17 0944    Subjective  New med to bring name next session for fluid. Sometimes lgg locks and I have to be careful.  Ankles swell.    Patient is accompained by:  Family member    Pain Score  8     Pain Location  Leg Now 0/10    Pain Orientation  Right    Pain Descriptors / Indicators  Aching;Sore;Tingling crawley    Pain Type  Chronic pain    Pain Radiating Towards  lower leg    Pain Frequency  Intermittent    Aggravating Factors   morning    Pain Relieving Factors  heat stretch tylenol    Effect of Pain on Daily Activities  limits wakling,  balance,,  wakes                       [facility] Adult PT Treatment/Exercise - 01/28/17 0001      Lumbar Exercises: Stretches   Single Knee to Chest Stretch  3 reps;30 seconds    Pelvic Tilt  -- 5 x 1 set,   then hold for  5 breaths.     Piriformis Stretch  1 rep;20 seconds both  HIP  IR/ER too      Lumbar Exercises: Aerobic   Elliptical  Nu step L3 X 5 minutes      Lumbar Exercises: Supine   Ab Set  5 reps with breathing    Clam  10 reps yellow band,  also ball squeeze 10 X cued breathing    Bent Knee Raise  10 reps monitored for abdominal engaged    Bridge  10 reps    Other Supine Lumbar Exercises  decompression,  leg press      Knee/Hip Exercises: Stretches   Passive Hamstring Stretch  3 reps;30 seconds      Knee/Hip Exercises: Seated   Hamstring Curl  2 sets;10 reps    Hamstring Limitations  yellow band               PT Short Term Goals - 01/21/17 1044      PT SHORT TERM GOAL #1   Title  Pt will be I with initial HEP     Baseline  needs cues,  wants someone with her to exercise    Status  On-going      PT SHORT TERM GOAL #2   Title  Pt will report improved flexibility in trunk and  hips fo greater ease of ADLs    Status  On-going      PT SHORT TERM GOAL #3   Title  Pt will complete balance screen and set goal, if appropriate    Status  Achieved        PT Long Term Goals - 01/21/17 1044      PT LONG TERM GOAL #1   Title  Pt will be I with HEP as of last visit for continued strength and flexibiility.     Status  On-going      PT LONG TERM GOAL #2   Title  Pt will understand posture and body mechanics to avoid injury, flare up.     Status  On-going      PT LONG TERM GOAL #3   Title  Pt will be able to improve balance , Berg Score improved to 46/56    Status  Unable to assess      PT LONG TERM GOAL #4   Title  Pt will be able to dorsiflex Rt. ankle fully (+10 deg) for safe driving as allowed by MD.     Status  On-going      PT LONG TERM GOAL #5   Title  Pt will demo 4+/5 strength in hip abduction for improved gait and stability.     Status  On-going            Plan - 01/28/17 1249    Clinical Impression Statement  Patient energy is improving but she is still not back to baseline.  Exercises geared to energy level.  She felt "Energized" post session.  She declined the need for heat.   LE/UE X 5 minutes on Nu step.    PT Next Visit Plan  NuStep, check HEP, MHP, begin posture and body mech , strengthen hips and core     PT Home Exercise Plan  basic back , Lumbar stab 2. , given supine hamstring and lateral hip/ITB stretch with strap.     Family Member Consulted  Daughter [name]       Patient will benefit from skilled therapeutic intervention in order to improve the following  deficits and impairments:     Visit Diagnosis: Chronic bilateral low back pain with right-sided sciatica  Muscle weakness (generalized)  Myalgia  Other abnormalities of gait and mobility     Problem List Patient Active Problem List   Diagnosis Date Noted  . GI bleed 01/10/2017  . Onychomycosis 01/06/2017  . Fatigue 01/06/2017  . Lumbar stenosis 11/10/2016  .  Atherosclerosis of native artery of both lower extremities with intermittent claudication ([place]) 08/08/2016  . Peripheral arterial disease ([place]) 08/04/2016  . Leg cramps 06/20/2016  . Vitamin D deficiency 11/28/2015  . Left hip pain 11/28/2015  . Osteoporosis 09/16/2015  . Pain in joint, shoulder region 09/16/2015  . Estrogen deficiency 03/14/2015  . Spinal stenosis of lumbar region 01/24/2015  . Myalgia 01/11/2015  . Preventative health care 12/11/2014  . Essential hypertension 12/11/2014  . Pulmonary hypertension ([place])   . Chronic cough   . Chronic diastolic heart failure ([place]) 05/15/2013  . Diabetes ([place]) 05/14/2013  . Other vitamin B12 deficiency anemia 07/13/2012  . Restless legs syndrome (RLS) 07/13/2012    HARRIS,KAREN PTA 01/28/2017, 12:52 PM  [facility] [address] [place], [place], [address] Phone: [phone]   Fax:  [phone]  Name: AYLLA HUFFINE MRN: [id number] Date of Birth: 01-31-1926  PHYSICAL THERAPY DISCHARGE SUMMARY  Visits from Start of Care: 8  Current functional level related to goals / functional outcomes: See above for most recent info    Remaining deficits: See above    Education / Equipment: HEP, exercises, stretching, gait  Plan: Patient agrees to discharge.  Patient goals were not met. Patient is being discharged due to not returning since the last visit.  ?????    Patient stopped coming to PT, reason unknown.   Raeford Razor, PT 04/02/17 3:03 PM Phone: [phone] Fax: [phone]

## 2017-01-30 ENCOUNTER — Encounter: Payer: Self-pay | Admitting: Podiatry

## 2017-01-30 ENCOUNTER — Ambulatory Visit (INDEPENDENT_AMBULATORY_CARE_PROVIDER_SITE_OTHER): Payer: Medicare Other

## 2017-01-30 ENCOUNTER — Other Ambulatory Visit: Payer: Self-pay | Admitting: *Deleted

## 2017-01-30 ENCOUNTER — Ambulatory Visit (INDEPENDENT_AMBULATORY_CARE_PROVIDER_SITE_OTHER): Payer: Medicare Other | Admitting: Podiatry

## 2017-01-30 ENCOUNTER — Other Ambulatory Visit: Payer: Self-pay | Admitting: Podiatry

## 2017-01-30 DIAGNOSIS — Z9181 History of falling: Secondary | ICD-10-CM | POA: Diagnosis not present

## 2017-01-30 DIAGNOSIS — M7750 Other enthesopathy of unspecified foot: Secondary | ICD-10-CM

## 2017-01-30 DIAGNOSIS — M779 Enthesopathy, unspecified: Secondary | ICD-10-CM

## 2017-01-30 DIAGNOSIS — M76821 Posterior tibial tendinitis, right leg: Secondary | ICD-10-CM

## 2017-01-30 DIAGNOSIS — M79672 Pain in left foot: Secondary | ICD-10-CM | POA: Diagnosis not present

## 2017-01-30 DIAGNOSIS — M79671 Pain in right foot: Secondary | ICD-10-CM

## 2017-02-02 ENCOUNTER — Ambulatory Visit: Payer: Medicare Other | Admitting: Physical Therapy

## 2017-02-03 ENCOUNTER — Other Ambulatory Visit: Payer: Medicare Other | Admitting: Orthotics

## 2017-02-03 NOTE — Progress Notes (Signed)
Subjective:   Patient ID: Kristi Alexander, female   DOB: 83 y.o.   MRN: [id number]   HPI Patient presents stating that she has fallen several times and that her right foot is not functioning right and is sore along with arthritis in both feet.  Patient states that this is gotten worse and her balance issues have progressed significantly over the last year with what she feels like is a collapse of her right foot with instability.   ROS      Objective:  Physical Exam  Vascular status found to be diminished but intact bilateral with patient found to have neurological loss both sharp dull and vibratory.  Patient is noted to have good digital perfusion and does have loss of the posterior tibial tendon of a moderate degree with collapse of medial longitudinal arch and instability within the arch and instability of the ankle secondary to probable neuropathy with age and increased fall capabilities.       Assessment:  High risk fall patient with posterior tib dysfunction right and neuropathic changes right over left.     Plan:  Reviewed condition at great length and discussed with her and caregiver.  Due to increase in falls the fact she likes to be active but feels uncomfortable with her balance I recommended balance brace for the right with structural support for the posterior tibial tendon.  Patient is scheduled with ped orthotist to have balance bracing done right and was explained what will be necessary.  X-rays indicate there is collapse of medial longitudinal arch bilateral with significant arthritis of the midfoot

## 2017-02-04 ENCOUNTER — Other Ambulatory Visit: Payer: Self-pay

## 2017-02-04 ENCOUNTER — Ambulatory Visit: Payer: Medicare Other | Admitting: Internal Medicine

## 2017-02-04 ENCOUNTER — Encounter: Payer: Self-pay | Admitting: Internal Medicine

## 2017-02-04 VITALS — BP 141/74 | HR 87 | Temp 97.9°F | Ht 64.5 in | Wt 142.2 lb

## 2017-02-04 DIAGNOSIS — I10 Essential (primary) hypertension: Secondary | ICD-10-CM | POA: Diagnosis not present

## 2017-02-04 DIAGNOSIS — Z79899 Other long term (current) drug therapy: Secondary | ICD-10-CM

## 2017-02-04 DIAGNOSIS — Z8719 Personal history of other diseases of the digestive system: Secondary | ICD-10-CM

## 2017-02-04 DIAGNOSIS — K922 Gastrointestinal hemorrhage, unspecified: Secondary | ICD-10-CM

## 2017-02-04 DIAGNOSIS — Z87891 Personal history of nicotine dependence: Secondary | ICD-10-CM | POA: Diagnosis not present

## 2017-02-04 DIAGNOSIS — R7303 Prediabetes: Secondary | ICD-10-CM | POA: Diagnosis not present

## 2017-02-04 LAB — POCT GLYCOSYLATED HEMOGLOBIN (HGB A1C): Hemoglobin A1C: 6

## 2017-02-04 LAB — GLUCOSE, CAPILLARY: Glucose-Capillary: 128 mg/dL — ABNORMAL HIGH (ref 65–99)

## 2017-02-04 MED ORDER — HYDROXYZINE HCL 25 MG PO TABS: ORAL_TABLET | ORAL | 2 refills | Status: DC

## 2017-02-04 NOTE — Progress Notes (Signed)
   CC: HTN follow up  HPI:  [name] is a 83 y.o. female with a past medical history listed below here today for follow up of her HTN.  For details of today's visit and the status of her chronic medical issues please refer to the assessment and plan.   Past Medical History:  Diagnosis Date  . Abnormality of gait 11/22/2014  . Arthritis   . Cyst of breast, right, benign solitary   . Dyslipidemia   . H/O: hysterectomy   . Heart murmur   . History of appendectomy   . History of lumbosacral spine surgery   . Hypertension   . Inguinal hernia   . Lumbar stenosis   . Restless leg syndrome    Review of Systems:   No chest pain or shortness of breath  Physical Exam:  Vitals:   02/04/17 1313  BP: (!) 141/74  Pulse: 87  Temp: 97.9 F (36.6 C)  TempSrc: Oral  SpO2: 99%  Weight: 142 lb 3.2 oz (64.5 kg)  Height: 5' 4.5" (1.638 m)   General: alert, well-developed, and cooperative to examination.  Lungs: normal respiratory effort, no accessory muscle use, normal breath sounds, no crackles, and no wheezes. Heart: normal rate, regular rhythm, no murmur, no gallop, and no rub.  Abdomen: soft, non-tender, normal bowel sounds, no distention, no guarding, no rebound tenderness, no hepatomegaly, and no splenomegaly.  Msk: no joint swelling, no joint warmth, and no redness over joints.  Pulses: 2+ DP/PT pulses bilaterally Extremities: No cyanosis, clubbing, edema Neurologic: alert & oriented X3, Skin: turgor normal and no rashes.    Assessment & Plan:   See Encounters Tab for problem based charting.  Patient discussed with Dr. Eppie Gibson

## 2017-02-04 NOTE — Patient Instructions (Addendum)
Ms. Helder,  I am glad you are doing well. Continue taking your medications as before. Please follow up for your colonoscopy.  Please follow up with me in February.

## 2017-02-05 ENCOUNTER — Ambulatory Visit (INDEPENDENT_AMBULATORY_CARE_PROVIDER_SITE_OTHER): Payer: Medicare Other | Admitting: Orthopaedic Surgery

## 2017-02-05 ENCOUNTER — Encounter (INDEPENDENT_AMBULATORY_CARE_PROVIDER_SITE_OTHER): Payer: Self-pay | Admitting: Orthopaedic Surgery

## 2017-02-05 VITALS — BP 159/75 | HR 80 | Ht 61.5 in | Wt 142.0 lb

## 2017-02-05 DIAGNOSIS — Z9889 Other specified postprocedural states: Secondary | ICD-10-CM

## 2017-02-05 LAB — BMP8+ANION GAP
Anion Gap: 14 mmol/L (ref 10.0–18.0)
BUN / CREAT RATIO: 12 (ref 12–28)
BUN: 14 mg/dL (ref 8–27)
CO2: 23 mmol/L (ref 20–29)
CREATININE: 1.2 mg/dL — AB (ref 0.57–1.00)
Calcium: 9.3 mg/dL (ref 8.7–10.3)
Chloride: 107 mmol/L — ABNORMAL HIGH (ref 96–106)
GFR calc Af Amer: 46 mL/min/{1.73_m2} — ABNORMAL LOW (ref >59)
GFR, EST NON AFRICAN AMERICAN: 40 mL/min/{1.73_m2} — AB (ref >59)
Glucose: 104 mg/dL — ABNORMAL HIGH (ref 65–99)
Potassium: 4.5 mmol/L (ref 3.5–5.2)
SODIUM: 144 mmol/L (ref 134–144)

## 2017-02-05 NOTE — Progress Notes (Signed)
Post-Op Visit Note   Patient: Kristi Alexander           Date of Birth: 07/22/1925           MRN: [id number] Visit Date: 02/05/2017 PCP: Maryellen Pile, MD   Assessment & Plan: Postop L4 5 repeat decompression due to spinal stenosis.  She still has some discomfort in her right leg.  She ambulates with a cane to prevent falling.  Anterior tib gastrocsoleus is strong.  She did have some mild arterial disease which is being monitored but last ABIs looked okay.  She needs to continue to walk to work on leg strengthening.  She had some GI bleeding had a CT scan of the abdomen which showed the decompression satisfactory at the levels planned without pars defect and no listhesis.  She has some lumbar curvature endplate spurring and some moderate narrowing at other levels above but at 89 she is not a good candidate for multilevel decompression and instrumentation.  Should continue to work on walking to strengthen her legs and decrease her falling risk.  She continues her cane.  Recheck 6 months.  She is walking better than she was preop but does not like the fact that she is still using a cane and does not like the fact that her legs are still somewhat unstable.  A1c was 6.0.  Chief Complaint:  Chief Complaint  Patient presents with  . Lower Back - Follow-up   Visit Diagnoses:  1. Status post lumbar spine surgery for decompression of spinal cord     Plan: She needs to  continue her  walking to strengthen her legs.  I will recheck her in 6 months.  Follow-Up Instructions: Return in about 6 months (around 08/06/2017).   Orders:  No orders of the defined types were placed in this encounter.  No orders of the defined types were placed in this encounter.   Imaging: No results found.  PMFS History: Patient Active Problem List   Diagnosis Date Noted  . GI bleed 01/10/2017  . Onychomycosis 01/06/2017  . Fatigue 01/06/2017  . Lumbar stenosis 11/10/2016  . Atherosclerosis of native artery of  both lower extremities with intermittent claudication ([place]) 08/08/2016  . Peripheral arterial disease ([place]) 08/04/2016  . Leg cramps 06/20/2016  . Vitamin D deficiency 11/28/2015  . Left hip pain 11/28/2015  . Osteoporosis 09/16/2015  . Pain in joint, shoulder region 09/16/2015  . Estrogen deficiency 03/14/2015  . Spinal stenosis of lumbar region 01/24/2015  . Myalgia 01/11/2015  . Preventative health care 12/11/2014  . Essential hypertension 12/11/2014  . Pulmonary hypertension ([place])   . Chronic cough   . Chronic diastolic heart failure ([place]) 05/15/2013  . Diabetes ([place]) 05/14/2013  . Other vitamin B12 deficiency anemia 07/13/2012  . Restless legs syndrome (RLS) 07/13/2012   Past Medical History:  Diagnosis Date  . Abnormality of gait 11/22/2014  . Arthritis   . Cyst of breast, right, benign solitary   . Dyslipidemia   . H/O: hysterectomy   . Heart murmur   . History of appendectomy   . History of lumbosacral spine surgery   . Hypertension   . Inguinal hernia   . Lumbar stenosis   . Restless leg syndrome     Family History  Problem Relation Age of Onset  . Heart disease Mother   . Diabetes Mother   . Heart attack Father   . Lung cancer Brother   . Healthy Sister   . Stomach cancer  Son     Past Surgical History:  Procedure Laterality Date  . ABDOMINAL HYSTERECTOMY    . APPENDECTOMY    . BOWEL RESECTION N/A 03/23/2013   Procedure: SMALL BOWEL RESECTION;  Surgeon: Gwenyth Ober, MD;  Location: [place];  Service: General;  Laterality: N/A;  . BREAST CYST EXCISION     LEFT  . ESOPHAGOGASTRODUODENOSCOPY N/A 04/01/2013   Procedure: ESOPHAGOGASTRODUODENOSCOPY (EGD);  Surgeon: Gwenyth Ober, MD;  Location: [place];  Service: General;  Laterality: N/A;  . HERNIA REPAIR    . LAPAROTOMY N/A 03/23/2013   Procedure: EXPLORATORY LAPAROTOMY;  Surgeon: Gwenyth Ober, MD;  Location: [place];  Service: General;  Laterality: N/A;  . LUMBAR LAMINECTOMY/DECOMPRESSION MICRODISCECTOMY  N/A 11/10/2016   Procedure: L4-5 DECOMPRESSION FOR RECURRENT STENOSIS;  Surgeon: Marybelle Killings, MD;  Location: [place];  Service: Orthopedics;  Laterality: N/A;  . PEG PLACEMENT N/A 04/01/2013   Procedure: PERCUTANEOUS ENDOSCOPIC GASTROSTOMY (PEG) PLACEMENT;  Surgeon: Gwenyth Ober, MD;  Location: [place];  Service: General;  Laterality: N/A;  . TRACHEOSTOMY     feinstein   Social History   Occupational History  . Occupation: retired  Tobacco Use  . Smoking status: Former Smoker    Packs/day: 0.00    Types: Cigarettes    Last attempt to quit: 02/25/2003    Years since quitting: 13.9  . Smokeless tobacco: Never Used  Substance and Sexual Activity  . Alcohol use: Yes    Comment: About 2 drinks per night  . Drug use: No  . Sexual activity: Not on file

## 2017-02-06 ENCOUNTER — Ambulatory Visit: Payer: Medicare Other | Admitting: Orthotics

## 2017-02-06 DIAGNOSIS — M79672 Pain in left foot: Principal | ICD-10-CM

## 2017-02-06 DIAGNOSIS — M79671 Pain in right foot: Secondary | ICD-10-CM

## 2017-02-06 DIAGNOSIS — M76821 Posterior tibial tendinitis, right leg: Secondary | ICD-10-CM

## 2017-02-06 DIAGNOSIS — Z9181 History of falling: Secondary | ICD-10-CM

## 2017-02-06 NOTE — Assessment & Plan Note (Signed)
Denies any further melena or hematochezia. Still reports more fatigue than baseline. Denies any chest pain or shortness of breath. Unfortunately had to re-schedule her colonoscopy due to the recent inclement weather but was rescheduled for next week.   Follow up colonoscopy results

## 2017-02-06 NOTE — Assessment & Plan Note (Signed)
A1c remains 6.0 today off ay medications and has been stable for the past year. Denies any polyuria or polydipsia. Will continue to monitor annually.

## 2017-02-06 NOTE — Assessment & Plan Note (Signed)
BP Readings from Last 3 Encounters:  02/05/17 (!) 159/75  02/04/17 (!) 141/74  01/23/17 (!) 153/65    Lab Results  Component Value Date   NA 144 02/04/2017   K 4.5 02/04/2017   CREATININE 1.20 (H) 02/04/2017   BP much improved today at 141/74. Reports being seen by Dr. Terrence Dupont in the past few weeks and was re-started on spironolactone. She is unsure about the dose and did not bring her medications. Will need to get records to clarify her dosing. She is also currently taking amlodipine 10 mg daily, imdur 45 mg daily, losartan 100 mg daily and metoprolol 25 mg bid though some questions about these doses as well.  A/P Will need to get records from Dr. Zenia Resides office Need to clarify the dosing of her medications as she has had several medication changes outside of our records system BMET today without any electrolyte abnormalities. Cr bumped back to level 3 weeks ago. Will need to monitor.

## 2017-02-06 NOTE — Progress Notes (Signed)
Case discussed with Dr. Charlynn Grimes at the time of the visit.  We reviewed the resident's history and exam and pertinent patient test results.  I agree with the assessment, diagnosis and plan of care documented in the resident's note.

## 2017-02-09 NOTE — Progress Notes (Signed)
Patient declined to be cast for balance brace; she would rather do physical therapy first as she doesn't quite believe the MBB could help her.

## 2017-02-20 ENCOUNTER — Encounter (HOSPITAL_COMMUNITY): Payer: Medicare Other

## 2017-02-20 ENCOUNTER — Ambulatory Visit: Payer: Medicare Other | Admitting: Vascular Surgery

## 2017-04-15 ENCOUNTER — Encounter (INDEPENDENT_AMBULATORY_CARE_PROVIDER_SITE_OTHER): Payer: Self-pay

## 2017-04-15 ENCOUNTER — Other Ambulatory Visit: Payer: Self-pay

## 2017-04-15 ENCOUNTER — Ambulatory Visit: Payer: Medicare Other | Admitting: Internal Medicine

## 2017-04-15 ENCOUNTER — Encounter: Payer: Self-pay | Admitting: Internal Medicine

## 2017-04-15 VITALS — BP 125/49 | HR 71 | Temp 97.6°F | Ht 61.5 in | Wt 138.3 lb

## 2017-04-15 DIAGNOSIS — K922 Gastrointestinal hemorrhage, unspecified: Secondary | ICD-10-CM | POA: Diagnosis not present

## 2017-04-15 DIAGNOSIS — I10 Essential (primary) hypertension: Secondary | ICD-10-CM

## 2017-04-15 DIAGNOSIS — D518 Other vitamin B12 deficiency anemias: Secondary | ICD-10-CM | POA: Diagnosis not present

## 2017-04-15 DIAGNOSIS — M8580 Other specified disorders of bone density and structure, unspecified site: Secondary | ICD-10-CM

## 2017-04-15 DIAGNOSIS — Z79899 Other long term (current) drug therapy: Secondary | ICD-10-CM

## 2017-04-15 DIAGNOSIS — M19019 Primary osteoarthritis, unspecified shoulder: Secondary | ICD-10-CM

## 2017-04-15 DIAGNOSIS — E559 Vitamin D deficiency, unspecified: Secondary | ICD-10-CM | POA: Diagnosis not present

## 2017-04-15 DIAGNOSIS — M25512 Pain in left shoulder: Secondary | ICD-10-CM

## 2017-04-15 DIAGNOSIS — Z8719 Personal history of other diseases of the digestive system: Secondary | ICD-10-CM | POA: Diagnosis not present

## 2017-04-15 DIAGNOSIS — D513 Other dietary vitamin B12 deficiency anemia: Secondary | ICD-10-CM | POA: Diagnosis not present

## 2017-04-15 MED ORDER — CALCIUM-VITAMIN D 600-400 MG-UNIT PO TABS: 1.0000 | ORAL_TABLET | Freq: Every day | ORAL | 2 refills | Status: DC

## 2017-04-15 MED ORDER — DICLOFENAC SODIUM 1 % TD GEL: 2.0000 g | Freq: Four times a day (QID) | TRANSDERMAL | 2 refills | Status: DC

## 2017-04-15 NOTE — Assessment & Plan Note (Signed)
She denies had a history of metatarsal fracture from a fall in the past.  She had briefly been on bisphosphonate therapy however he did developed left hip pain after starting the medication and it was discontinued.  Her follow-up DEXA scan showed osteopenia.  Been on high-dose vitamin D supplementation and reports compliance.  Recent falls.  Assessment and plan: We will recheck vitamin D level today.  Will stop her high-dose vitamin D replacement.  We will start calcium vitamin D daily supplement.

## 2017-04-15 NOTE — Assessment & Plan Note (Signed)
We will recheck vitamin B12 level today.

## 2017-04-15 NOTE — Assessment & Plan Note (Signed)
BP Readings from Last 3 Encounters:  04/15/17 (!) 125/49  02/05/17 (!) 159/75  02/04/17 (!) 141/74    Lab Results  Component Value Date   NA 144 02/04/2017   K 4.5 02/04/2017   CREATININE 1.20 (H) 02/04/2017   Blood pressure is improved today at 125/49.  She is currently taking losartan 50 mg daily, metoprolol 25 mg extended release, amlodipine 10 mg daily, 30 mg daily, spironolactone 12.5 mg daily.  She reports she is doing well without any side effects from these medications.  Follows up with Dr. Terrence Dupont her cardiologist.  Plan: She is doing quite well on this regimen will continue today.

## 2017-04-15 NOTE — Assessment & Plan Note (Signed)
Patient requests Voltaren gel for her arthritis pains.  Will give Rx today.

## 2017-04-15 NOTE — Patient Instructions (Signed)
Kristi Alexander,  Happy belated birthday! I am glad you are doing well. I would like to check your blood counts and vitamin levels today.  You can stop taking the high dose vitamin D 50,000 mg weekly. Instead I would like to start you on a calcium and vitamin D combination pill daily.  Your blood pressure is doing much better today. We won't make any changes to this regimen.   Please follow up with [place] in 6 months or sooner if you have problems.

## 2017-04-15 NOTE — Progress Notes (Signed)
   CC: Hypertension follow-up  HPI:  Kristi Alexander is a 84 y.o.  female with a past medical history listed below here today for follow up of her hypertension.  For details of today's visit and the status of her chronic medical issues please refer to the assessment and plan.   Past Medical History:  Diagnosis Date  . Abnormality of gait 11/22/2014  . Arthritis   . Cyst of breast, right, benign solitary   . Dyslipidemia   . H/O: hysterectomy   . Heart murmur   . History of appendectomy   . History of lumbosacral spine surgery   . Hypertension   . Inguinal hernia   . Lumbar stenosis   . Restless leg syndrome    Review of Systems:   No chest pain or shortness of breath  Physical Exam:  Vitals:   04/15/17 1421  BP: (!) 125/49  Pulse: 71  Temp: 97.6 F (36.4 C)  TempSrc: Oral  SpO2: 100%  Weight: 138 lb 4.8 oz (62.7 kg)  Height: 5' 1.5" (1.562 m)   GENERAL- alert, co-operative, appears as stated age, not in any distress. CARDIAC- RRR, no murmurs, rubs or gallops. RESP- Moving equal volumes of air, and clear to auscultation bilaterally, no wheezes or crackles. ABDOMEN- Soft, nontender, bowel sounds present. EXTREMITIES- pulse 2+, symmetric, no pedal edema. SKIN- Warm, dry, No rash or lesion. PSYCH- Normal mood and affect, appropriate thought content and speech.   Assessment & Plan:   See Encounters Tab for problem based charting.  Patient discussed with Dr. Angelia Mould

## 2017-04-15 NOTE — Assessment & Plan Note (Signed)
We will recheck vitamin D level today.  Will transition off the high-dose vitamin D replacement and give daily oral dosing.

## 2017-04-15 NOTE — Assessment & Plan Note (Signed)
She reports she is doing well without any further incidents of hematochezia or melena.  She is currently taking iron supplements.  She states that she did follow-up with gastroenterology, Dr. Michail Sermon, and was told that she would not need colonoscopy for further evaluation.  I do not have access to his notes in our system.  She denies any chest pain, shortness of breath.  Assessment and plan: Given her advanced age I think it is reasonable to not further investigate with colonoscopy.  Given return precautions for rebleeding.  Will check CBC today.

## 2017-04-16 LAB — CBC
Hematocrit: 36.6 % (ref 34.0–46.6)
Hemoglobin: 11.5 g/dL (ref 11.1–15.9)
MCH: 26.9 pg (ref 26.6–33.0)
MCHC: 31.4 g/dL — AB (ref 31.5–35.7)
MCV: 86 fL (ref 79–97)
Platelets: 292 10*3/uL (ref 150–379)
RBC: 4.28 x10E6/uL (ref 3.77–5.28)
RDW: 17.9 % — ABNORMAL HIGH (ref 12.3–15.4)
WBC: 6 10*3/uL (ref 3.4–10.8)

## 2017-04-16 LAB — VITAMIN D 25 HYDROXY (VIT D DEFICIENCY, FRACTURES): Vit D, 25-Hydroxy: 24.1 ng/mL — ABNORMAL LOW (ref 30.0–100.0)

## 2017-04-16 LAB — VITAMIN B12: Vitamin B-12: 1102 pg/mL (ref 232–1245)

## 2017-04-16 NOTE — Progress Notes (Signed)
Internal Medicine Clinic Attending  Case discussed with Dr. Boswell at the time of the visit.  We reviewed the resident's history and exam and pertinent patient test results.  I agree with the assessment, diagnosis, and plan of care documented in the resident's note.  

## 2017-04-17 ENCOUNTER — Telehealth: Payer: Self-pay | Admitting: *Deleted

## 2017-04-17 NOTE — Telephone Encounter (Signed)
Call to [place] for PA Voltaren Gel 1%.  Changed to Diclofenac Gel 1%.  Approved 04/17/2017 thru 02/23/2018.  Reference number [id number].     [facility] called at [phone] and informed of approval.  Sander Nephew, RN 04/17/2017 10:21 AM

## 2017-04-21 ENCOUNTER — Other Ambulatory Visit: Payer: Self-pay | Admitting: *Deleted

## 2017-04-22 MED ORDER — AMLODIPINE BESYLATE 10 MG PO TABS: 10.0000 mg | ORAL_TABLET | Freq: Every day | ORAL | 3 refills | Status: DC

## 2017-04-24 ENCOUNTER — Encounter: Payer: Self-pay | Admitting: Podiatry

## 2017-04-24 ENCOUNTER — Ambulatory Visit (INDEPENDENT_AMBULATORY_CARE_PROVIDER_SITE_OTHER): Payer: Medicare Other | Admitting: Podiatry

## 2017-04-24 DIAGNOSIS — M79672 Pain in left foot: Secondary | ICD-10-CM

## 2017-04-24 DIAGNOSIS — M79671 Pain in right foot: Secondary | ICD-10-CM

## 2017-04-24 DIAGNOSIS — Z9181 History of falling: Secondary | ICD-10-CM

## 2017-04-24 NOTE — Progress Notes (Signed)
Patient was erroneously appointed for bracing evaluation.  Gardiner Barefoot DPM

## 2017-04-28 ENCOUNTER — Telehealth (INDEPENDENT_AMBULATORY_CARE_PROVIDER_SITE_OTHER): Payer: Self-pay | Admitting: Orthopaedic Surgery

## 2017-04-28 NOTE — Telephone Encounter (Signed)
IC s/w patient and she was having terrible time at night due to pain, stiffness and tightness in both legs. Did not have ROV until June. I scheduled sooner appt to see Dr Lorin Mercy.  Tried taking tylenol without relief.

## 2017-04-28 NOTE — Telephone Encounter (Signed)
Patient would like for you call her.  CB#[phone].  Thank you.

## 2017-04-28 NOTE — Telephone Encounter (Signed)
Can you please call patient? Thanks.

## 2017-05-06 ENCOUNTER — Ambulatory Visit (INDEPENDENT_AMBULATORY_CARE_PROVIDER_SITE_OTHER): Payer: Medicare Other

## 2017-05-06 ENCOUNTER — Other Ambulatory Visit: Payer: Self-pay | Admitting: Internal Medicine

## 2017-05-06 ENCOUNTER — Ambulatory Visit (INDEPENDENT_AMBULATORY_CARE_PROVIDER_SITE_OTHER): Payer: Medicare Other | Admitting: Surgery

## 2017-05-06 ENCOUNTER — Other Ambulatory Visit: Payer: Self-pay | Admitting: *Deleted

## 2017-05-06 VITALS — Ht 61.5 in | Wt 138.0 lb

## 2017-05-06 DIAGNOSIS — M25551 Pain in right hip: Secondary | ICD-10-CM

## 2017-05-06 DIAGNOSIS — M5416 Radiculopathy, lumbar region: Secondary | ICD-10-CM

## 2017-05-06 DIAGNOSIS — M7061 Trochanteric bursitis, right hip: Secondary | ICD-10-CM

## 2017-05-06 NOTE — Telephone Encounter (Signed)
Clobetasol on her medication.  Will not refill as I am unsure why she is taking this medicine or who prescribed it.  Please clarify with patient.  Thank you.

## 2017-05-06 NOTE — Progress Notes (Signed)
Office Visit Note   Patient: Kristi Alexander           Date of Birth: 25-Jan-1926           MRN: [id number] Visit Date: 05/06/2017              Requested by: Maryellen Pile, MD No address on file PCP: Mike Craze, DO   Assessment & Plan: Visit Diagnoses:  1. Radiculopathy, lumbar region   2. Pain in right hip   3. Greater trochanteric bursitis, right     Plan: In hopes of giving patient some relief of her lateral right hip pain offered injection.  Patient sent right lateral hip was prepped with Betadine and greater trochanter bursa Marcaine/Depo-Medrol injection was performed.  Follow-up in 4 months for recheck with Dr. Lorin Mercy.  Return sooner if needed.  Follow-Up Instructions: No follow-ups on file.   Orders:  Orders Placed This Encounter  Procedures  . Large Joint Inj: R greater trochanter  . XR Lumbar Spine 2-3 Views  . XR HIP UNILAT W OR W/O PELVIS 2-3 VIEWS RIGHT   No orders of the defined types were placed in this encounter.     Procedures: Large Joint Inj: R greater trochanter on 05/06/2017 10:59 AM Indications: pain Details: 22 G 3.5 in needle, lateral approach Medications: 6 mL bupivacaine 0.25 %; 3 mL lidocaine 1 %; 80 mg methylPREDNISolone acetate 40 MG/ML Outcome: tolerated well, no immediate complications Consent was given by the patient. Patient was prepped and draped in the usual sterile fashion.       Clinical Data: No additional findings.   Subjective: Chief Complaint  Patient presents with  . Lower Back - Pain    S/p L4-5 decompression for recurrent stenosis 11/10/16  . Right Leg - Pain  . Left Leg - Pain    HPI 84 year old female comes in today with complaints of right greater left leg pain.  Patient states that most of her pain is over the right lateral hip.  Aggravated when she is ambulating and laying on her right side.  She status post L4-5 decompression November 10, 2016.  Ambulate with a quad cane.  Has taken Tylenol PRN for  pain that helps some.  No injury. Review of Systems No current cardiopulmonary GI GU issues  Objective: Vital Signs: Ht 5' 1.5" (1.562 m)   Wt 138 lb (62.6 kg)   BMI 25.65 kg/m   Physical Exam  Constitutional: No distress.  HENT:  Head: Normocephalic and atraumatic.  Eyes: Pupils are equal, round, and reactive to light. EOM are normal.  Neck: Normal range of motion.  Musculoskeletal:  Gait is antalgic.  Negative logroll bilateral hips.  Negative straight leg raise.  She has moderate to markedly tender over the right hip greater trochanter bursa.  Calves nontender.  Neurovascular intact.  Skin: Skin is warm and dry.  Psychiatric: She has a normal mood and affect.    Ortho Exam  Specialty Comments:  No specialty comments available.  Imaging: No results found.   PMFS History: Patient Active Problem List   Diagnosis Date Noted  . History of lumbar laminectomy for spinal cord decompression 08/06/2017  . GI bleed 01/10/2017  . Atherosclerosis of native artery of both lower extremities with intermittent claudication ([place]) 08/08/2016  . Peripheral arterial disease ([place]) 08/04/2016  . Vitamin D deficiency 11/28/2015  . Osteopenia 09/16/2015  . Pain in joint, shoulder region 09/16/2015  . Spinal stenosis of lumbar region 01/24/2015  . Preventative  health care 12/11/2014  . Essential hypertension 12/11/2014  . Pulmonary hypertension ([place])   . Chronic cough   . Chronic diastolic heart failure ([place]) 05/15/2013  . Pre-diabetes 05/14/2013  . Other vitamin B12 deficiency anemia 07/13/2012  . Restless legs syndrome (RLS) 07/13/2012   Past Medical History:  Diagnosis Date  . Abnormality of gait 11/22/2014  . Arthritis   . Cyst of breast, right, benign solitary   . Dyslipidemia   . H/O: hysterectomy   . Heart murmur   . History of appendectomy   . History of lumbosacral spine surgery   . Hypertension   . Inguinal hernia   . Lumbar stenosis   . Restless leg syndrome       Family History  Problem Relation Age of Onset  . Heart disease Mother   . Diabetes Mother   . Heart attack Father   . Lung cancer Brother   . Healthy Sister   . Stomach cancer Son     Past Surgical History:  Procedure Laterality Date  . ABDOMINAL HYSTERECTOMY    . APPENDECTOMY    . BOWEL RESECTION N/A 03/23/2013   Procedure: SMALL BOWEL RESECTION;  Surgeon: Gwenyth Ober, MD;  Location: [place];  Service: General;  Laterality: N/A;  . BREAST CYST EXCISION     LEFT  . ESOPHAGOGASTRODUODENOSCOPY N/A 04/01/2013   Procedure: ESOPHAGOGASTRODUODENOSCOPY (EGD);  Surgeon: Gwenyth Ober, MD;  Location: [place];  Service: General;  Laterality: N/A;  . HERNIA REPAIR    . LAPAROTOMY N/A 03/23/2013   Procedure: EXPLORATORY LAPAROTOMY;  Surgeon: Gwenyth Ober, MD;  Location: [place];  Service: General;  Laterality: N/A;  . LUMBAR LAMINECTOMY/DECOMPRESSION MICRODISCECTOMY N/A 11/10/2016   Procedure: L4-5 DECOMPRESSION FOR RECURRENT STENOSIS;  Surgeon: Marybelle Killings, MD;  Location: [place];  Service: Orthopedics;  Laterality: N/A;  . PEG PLACEMENT N/A 04/01/2013   Procedure: PERCUTANEOUS ENDOSCOPIC GASTROSTOMY (PEG) PLACEMENT;  Surgeon: Gwenyth Ober, MD;  Location: [place];  Service: General;  Laterality: N/A;  . TRACHEOSTOMY     feinstein   Social History   Occupational History  . Occupation: retired  Tobacco Use  . Smoking status: Former Smoker    Packs/day: 0.00    Types: Cigarettes    Last attempt to quit: 02/25/2003    Years since quitting: 14.5  . Smokeless tobacco: Never Used  Substance and Sexual Activity  . Alcohol use: Yes    Comment: About 2 drinks per night  . Drug use: No  . Sexual activity: Not on file

## 2017-05-06 NOTE — Telephone Encounter (Signed)
Patient daughter is requesting a refill colbetasol, Paediatric nurse on [employer]

## 2017-05-08 MED ORDER — METOPROLOL SUCCINATE ER 25 MG PO TB24: 25.0000 mg | ORAL_TABLET | Freq: Every day | ORAL | 3 refills | Status: DC

## 2017-05-08 NOTE — Telephone Encounter (Signed)
Left message on daughter's VM requesting return call to learn initial prescriber and why pt needs it. Hubbard Hartshorn, RN, BSN

## 2017-05-08 NOTE — Telephone Encounter (Signed)
Daughter called back. States mom is "allergic to everything." Has had allergies since childhood to foods and environment. Used to see Derm but not at present as it's been "a long time since she had a breakout." Patient noticed itchy "little bumps and blotches" that become red with scratching around ears, neck and nape last weekend. Has taken hydroxyzine but this only helps with itching. Of note patient began wearing a Life Alert necklace only days before rash appeared. Daughter states clobetasol has helped with similar break outs in past. States last needed 2017. Hubbard Hartshorn, RN, BSN

## 2017-05-11 MED ORDER — BETAMETHASONE DIPROPIONATE 0.05 % EX CREA: TOPICAL_CREAM | Freq: Two times a day (BID) | CUTANEOUS | 0 refills | Status: DC

## 2017-05-11 NOTE — Telephone Encounter (Signed)
This is a super high potency topical steroid and I am not comfortable prescribing it as I cannot find derm's notes. I am Rxing a lower potency steroid. She will need to see a physician prior to any refills. Can she get [place] her derm notes? Wash hands after using, do not use on face, neck, hands, skin folds.

## 2017-05-11 NOTE — Addendum Note (Signed)
Addended by: Larey Dresser A on: 05/11/2017 03:52 PM   Modules accepted: Orders

## 2017-07-14 ENCOUNTER — Ambulatory Visit: Payer: Medicare Other | Admitting: Family Medicine

## 2017-07-14 ENCOUNTER — Encounter: Payer: Self-pay | Admitting: Family Medicine

## 2017-07-14 DIAGNOSIS — M25579 Pain in unspecified ankle and joints of unspecified foot: Secondary | ICD-10-CM

## 2017-07-14 NOTE — Progress Notes (Signed)
Chief complaint: Bilateral foot pain and tightness x12 months  History of present illness: Kristi Alexander is a 84 year old female who presents to sports medicine office today, accompanied by her daughter, with chief complaint of bilateral foot pain and tightness.  She points to the medial aspect of both her ankles and plantar aspect of feet. She mainly describes symptoms as a tightness sensation.  She reports that symptoms have been present for approximately 1 year now.  She does not report of any specific inciting incident, trauma, or injury to explain the pain.  She reports that she really notices the pain when she walks, especially for long distances.  She does not report of any swelling, painful popping, locking, or catching of the ankle joint.  She reports that she notices pain when she wakes up in the morning and takes a step, but more notices it on the medial side of her right ankle not over the plantar fascial region.  She does not report of any pain waking her up from sleep at nighttime.  She does not report of any numbness, tingling, or burning paresthesias.  She reports symptoms have been unchanged over the last 12 months.  She does not recent changes in shoewear or recreational endeavors.  She has used occasional Tylenol for pain. She does not report of any previous foot or ankle trauma.  She does have history of spinal stenosis at L4-5 with repeat compression done by Dr. Lorin Mercy.  She does not report of any sharp shooting paresthesias radiating from her back down her legs to her ankle and feet.  Review of systems:  As stated above  Her past medical history, surgical history, family history, and social history obtained and reviewed.  Her past medical history is notable for hypertension, peripheral arterial disease, CAD, diastolic heart failure, history of GI bleed, vitamin B12 deficiency, restless leg syndrome, and history of lumbar spinal stenosis; surgical history notable for lumbar laminectomy, with  decompression microdiscectomy at L4-5, EGD, PEG placement, laparotomy, bowel resection, abdominal hysterectomy, appendectomy, breast cyst excision, hernia repair, and tracheostomy; she does not report of any current tobacco use; family history notable for stomach cancer, lung cancer, CAD, MI, and type 2 diabetes; allergies and medications are reviewed and are reflected in EMR.  Physical exam: Vital signs are reviewed and are documented in the chart Gen.: Alert, oriented, appears stated age, in no apparent distress HEENT: Moist oral mucosa Respiratory: Normal respirations, able to speak in full sentences Cardiac: Regular rate, distal pulses 2+ Integumentary: No rashes on visible skin:  Neurologic: Strength 5/5 with bilateral ankle dorsiflexion, plantarflexion, inversion, eversion, strength also 5/5 with bilateral knee flexion and knee extension, sensation 2+ in bilateral lower extremities, DTRs symmetric and intact in bilateral lower extremities Psych: Normal affect, mood is described as good Musculoskeletal: Inspection of both of her feet and ankle reveal no obvious deformity or muscle atrophy, no warmth, erythema, ecchymosis, or effusion, no tenderness to palpation anywhere in either foot or ankle today, she does have full ankle range of motion and strength as noted above, on gait ambulation and evaluation she does have significant pronation with about 5 degrees of calcaneal valgus on both sides, no evidence of posterior tibialis tendon dysfunction or insufficiency  Assessment and plan: 1. Bilateral medial and plantar foot tightness and pain, suspect secondary to gait abnormality with severe dynamic pronation and calcaneal valgus 2. History of lumbar spinal stenosis status post lumbar laminectomy and decompression  Plan: Discussed with [name] and her daughter that it seems  though that biomechanical issues are at play in regards to the tightness that she is having in her feet.  Discussed having  her placed in temporary green insoles with scaphoid padding.  This was fitted to her satisfaction, it actually did significantly relieve the tightness that she was having.  It also diminished the amount of pronation that she had with ambulation.  Discussed to do a trial of these over the next 3 weeks.  If this is the solution for her, discussed that next step would be to make custom orthotics for her.  She will return in 3 weeks for reevaluation and making of custom orthotics.  Otherwise, she will follow-up sooner as needed.   Mort Sawyers, M.D. [facility]

## 2017-07-28 ENCOUNTER — Other Ambulatory Visit: Payer: Self-pay | Admitting: Internal Medicine

## 2017-07-29 ENCOUNTER — Encounter: Payer: Medicare Other | Admitting: Family Medicine

## 2017-07-29 ENCOUNTER — Telehealth: Payer: Self-pay | Admitting: Family Medicine

## 2017-07-29 NOTE — Telephone Encounter (Signed)
This note is to reflect that Kristi Alexander did not appear to office today for scheduled appoint to have orthotics made.  She is welcome to reschedule appointment at a future date if she would like to.    Mort Sawyers, MD Primary Care Sports Medicine Fellow [facility]

## 2017-08-05 ENCOUNTER — Ambulatory Visit (INDEPENDENT_AMBULATORY_CARE_PROVIDER_SITE_OTHER): Payer: Medicare Other | Admitting: Orthopaedic Surgery

## 2017-08-05 ENCOUNTER — Encounter (INDEPENDENT_AMBULATORY_CARE_PROVIDER_SITE_OTHER): Payer: Self-pay | Admitting: Orthopaedic Surgery

## 2017-08-05 VITALS — BP 141/76 | HR 74 | Ht 62.0 in | Wt 136.0 lb

## 2017-08-05 DIAGNOSIS — M7061 Trochanteric bursitis, right hip: Secondary | ICD-10-CM

## 2017-08-05 DIAGNOSIS — Z9889 Other specified postprocedural states: Secondary | ICD-10-CM

## 2017-08-05 NOTE — Progress Notes (Signed)
Office Visit Note   Patient: Kristi Alexander           Date of Birth: 1925/08/08           MRN: [id number] Visit Date: 08/05/2017              Requested by: Maryellen Pile, MD [address] [place], [place] [address] PCP: Maryellen Pile, MD   Assessment & Plan: Visit Diagnoses:  1. Trochanteric bursitis, right hip   2. History of lumbar laminectomy for spinal cord decompression     Plan: The right greater trochanteric injection performed for trochanteric bursitis post L4-5 decompression.  She does to have some mild to moderate narrowing at other levels that was not surgically addressed due to her age, medical history with heart failure, pulmonary hypertension.  Is walking better since his surgery.  She got relief after the trochanteric injection and will check her back again as needed.  I encouraged her to continue to work on walking and  use her cane for fall prevention.  Follow-Up Instructions: No follow-ups on file.   Orders:  Orders Placed This Encounter  Procedures  . Large Joint Inj: R greater trochanter   No orders of the defined types were placed in this encounter.     Procedures: Large Joint Inj: R greater trochanter on 08/05/2017 11:04 AM Details: 22 G needle, lateral approach Medications: 0.5 mL lidocaine 1 %; 2 mL bupivacaine 0.5 %; 40 mg methylPREDNISolone acetate 40 MG/ML      Clinical Data: No additional findings.   Subjective: Chief Complaint  Patient presents with  . Lower Back - Pain    HPI 84 year old female returns post L4-5 decompression 11/10/2016 at right lateral trochanteric pain.  Previous injection in March and states that helped for period of time and her symptoms have gradually recurred.  States her legs and feet more on the right than left feel tight when she gets up in the morning with aching primarily around the trochanter.  Continues to have some balance problems elation.  Narrowing at other levels besides the L4-5 which was more  severe than had the compression.  Diabetic control is been good.  No bowel or bladder symptoms.  Chills.  No drainage from her incision.  Review of Systems reviewed updated unchanged from September 2018 surgery.  Of note is problems with GI bleeding CT scan have been performed for evaluation of this which showed satisfactory decompression.   Objective: Vital Signs: BP (!) 141/76   Pulse 74   Ht 5\' 2"  (1.575 m)   Wt 136 lb (61.7 kg)   BMI 24.87 kg/m   Physical Exam  Constitutional: She is oriented to person, place, and time. She appears well-developed.  HENT:  Head: Normocephalic.  Right Ear: External ear normal.  Left Ear: External ear normal.  Eyes: Pupils are equal, round, and reactive to light.  Neck: No tracheal deviation present. No thyromegaly present.  Cardiovascular: Normal rate.  Pulmonary/Chest: Effort normal.  Abdominal: Soft.  Neurological: She is alert and oriented to person, place, and time.  Skin: Skin is warm and dry.  Psychiatric: She has a normal mood and affect. Her behavior is normal.    Ortho Exam lumbar incisions well-healed minimal sciatic notch tenderness.'s over the right greater trochanter.  Knee range of motion is full.  Distal pulses are intact negative Homans test no venous engorgement.  No tenderness over the left greater trochanter.  Negative logroll to both hips.  Specialty Comments:  No  specialty comments available.  Imaging: No results found.   PMFS History: Patient Active Problem List   Diagnosis Date Noted  . GI bleed 01/10/2017  . Atherosclerosis of native artery of both lower extremities with intermittent claudication ([place]) 08/08/2016  . Peripheral arterial disease ([place]) 08/04/2016  . Vitamin D deficiency 11/28/2015  . Osteopenia 09/16/2015  . Pain in joint, shoulder region 09/16/2015  . Spinal stenosis of lumbar region 01/24/2015  . Preventative health care 12/11/2014  . Essential hypertension 12/11/2014  . Pulmonary  hypertension ([place])   . Chronic cough   . Chronic diastolic heart failure ([place]) 05/15/2013  . Pre-diabetes 05/14/2013  . Other vitamin B12 deficiency anemia 07/13/2012  . Restless legs syndrome (RLS) 07/13/2012   Past Medical History:  Diagnosis Date  . Abnormality of gait 11/22/2014  . Arthritis   . Cyst of breast, right, benign solitary   . Dyslipidemia   . H/O: hysterectomy   . Heart murmur   . History of appendectomy   . History of lumbosacral spine surgery   . Hypertension   . Inguinal hernia   . Lumbar stenosis   . Restless leg syndrome     Family History  Problem Relation Age of Onset  . Heart disease Mother   . Diabetes Mother   . Heart attack Father   . Lung cancer Brother   . Healthy Sister   . Stomach cancer Son     Past Surgical History:  Procedure Laterality Date  . ABDOMINAL HYSTERECTOMY    . APPENDECTOMY    . BOWEL RESECTION N/A 03/23/2013   Procedure: SMALL BOWEL RESECTION;  Surgeon: Gwenyth Ober, MD;  Location: [place] [facility];  Service: General;  Laterality: N/A;  . BREAST CYST EXCISION     LEFT  . ESOPHAGOGASTRODUODENOSCOPY N/A 04/01/2013   Procedure: ESOPHAGOGASTRODUODENOSCOPY (EGD);  Surgeon: Gwenyth Ober, MD;  Location: [facility];  Service: General;  Laterality: N/A;  . HERNIA REPAIR    . LAPAROTOMY N/A 03/23/2013   Procedure: EXPLORATORY LAPAROTOMY;  Surgeon: Gwenyth Ober, MD;  Location: [facility];  Service: General;  Laterality: N/A;  . LUMBAR LAMINECTOMY/DECOMPRESSION MICRODISCECTOMY N/A 11/10/2016   Procedure: L4-5 DECOMPRESSION FOR RECURRENT STENOSIS;  Surgeon: Marybelle Killings, MD;  Location: [facility];  Service: Orthopedics;  Laterality: N/A;  . PEG PLACEMENT N/A 04/01/2013   Procedure: PERCUTANEOUS ENDOSCOPIC GASTROSTOMY (PEG) PLACEMENT;  Surgeon: Gwenyth Ober, MD;  Location: [facility];  Service: General;  Laterality: N/A;  . TRACHEOSTOMY     feinstein   Social History   Occupational History  . Occupation: retired  Tobacco Use  . Smoking status:  Former Smoker    Packs/day: 0.00    Types: Cigarettes    Last attempt to quit: 02/25/2003    Years since quitting: 14.4  . Smokeless tobacco: Never Used  Substance and Sexual Activity  . Alcohol use: Yes    Comment: About 2 drinks per night  . Drug use: No  . Sexual activity: Not on file

## 2017-08-06 ENCOUNTER — Encounter (INDEPENDENT_AMBULATORY_CARE_PROVIDER_SITE_OTHER): Payer: Self-pay | Admitting: Orthopaedic Surgery

## 2017-08-06 DIAGNOSIS — Z9889 Other specified postprocedural states: Secondary | ICD-10-CM | POA: Insufficient documentation

## 2017-08-06 MED ORDER — LIDOCAINE HCL 1 % IJ SOLN
0.5000 mL | INTRAMUSCULAR | Status: AC | PRN
  Administered 2017-08-05: .5 mL

## 2017-08-06 MED ORDER — BUPIVACAINE HCL 0.5 % IJ SOLN
2.0000 mL | INTRAMUSCULAR | Status: AC | PRN
  Administered 2017-08-05: 2 mL via INTRA_ARTICULAR

## 2017-08-06 MED ORDER — METHYLPREDNISOLONE ACETATE 40 MG/ML IJ SUSP
40.0000 mg | INTRAMUSCULAR | Status: AC | PRN
  Administered 2017-08-05: 40 mg via INTRA_ARTICULAR

## 2017-08-23 ENCOUNTER — Encounter: Payer: Self-pay | Admitting: *Deleted

## 2017-08-24 ENCOUNTER — Other Ambulatory Visit: Payer: Self-pay | Admitting: Internal Medicine

## 2017-09-07 ENCOUNTER — Encounter (INDEPENDENT_AMBULATORY_CARE_PROVIDER_SITE_OTHER): Payer: Self-pay | Admitting: Surgery

## 2017-09-07 MED ORDER — LIDOCAINE HCL 1 % IJ SOLN
3.0000 mL | INTRAMUSCULAR | Status: AC | PRN
  Administered 2017-05-06: 3 mL

## 2017-09-07 MED ORDER — BUPIVACAINE HCL 0.25 % IJ SOLN
6.0000 mL | INTRAMUSCULAR | Status: AC | PRN
  Administered 2017-05-06: 6 mL via INTRA_ARTICULAR

## 2017-09-07 MED ORDER — METHYLPREDNISOLONE ACETATE 40 MG/ML IJ SUSP
80.0000 mg | INTRAMUSCULAR | Status: AC | PRN
  Administered 2017-05-06: 80 mg

## 2017-09-18 ENCOUNTER — Other Ambulatory Visit: Payer: Self-pay | Admitting: Internal Medicine

## 2017-11-05 ENCOUNTER — Other Ambulatory Visit: Payer: Self-pay | Admitting: Internal Medicine

## 2017-11-10 ENCOUNTER — Encounter (INDEPENDENT_AMBULATORY_CARE_PROVIDER_SITE_OTHER): Payer: Self-pay | Admitting: Orthopaedic Surgery

## 2017-11-10 ENCOUNTER — Ambulatory Visit (INDEPENDENT_AMBULATORY_CARE_PROVIDER_SITE_OTHER): Payer: Medicare Other | Admitting: Orthopaedic Surgery

## 2017-11-10 VITALS — BP 160/73 | HR 57 | Ht 62.0 in | Wt 135.0 lb

## 2017-11-10 DIAGNOSIS — M7542 Impingement syndrome of left shoulder: Secondary | ICD-10-CM | POA: Diagnosis not present

## 2017-11-10 MED ORDER — BUPIVACAINE HCL 0.25 % IJ SOLN
4.0000 mL | INTRAMUSCULAR | Status: AC | PRN
  Administered 2017-11-10: 4 mL via INTRA_ARTICULAR

## 2017-11-10 MED ORDER — METHYLPREDNISOLONE ACETATE 40 MG/ML IJ SUSP
40.0000 mg | INTRAMUSCULAR | Status: AC | PRN
  Administered 2017-11-10: 40 mg via INTRA_ARTICULAR

## 2017-11-10 MED ORDER — LIDOCAINE HCL 1 % IJ SOLN
0.5000 mL | INTRAMUSCULAR | Status: AC | PRN
  Administered 2017-11-10: .5 mL

## 2017-11-10 NOTE — Progress Notes (Signed)
Office Visit Note   Patient: Kristi Alexander           Date of Birth: 1925/02/28           MRN: [id number] Visit Date: 11/10/2017              Requested by: Maryellen Pile, MD No address on file PCP: Mike Craze, DO   Assessment & Plan: Visit Diagnoses:  1. Impingement syndrome of left shoulder     Plan: Subacromial injection performed left shoulder with improvement in pain.  We can follow-up with her on a as needed basis.  Follow-Up Instructions: Return if symptoms worsen or fail to improve.   Orders:  Orders Placed This Encounter  Procedures  . Large Joint Inj: L subacromial bursa   No orders of the defined types were placed in this encounter.     Procedures: Large Joint Inj: L subacromial bursa on 11/10/2017 10:37 AM Indications: pain Details: 22 G 1.5 in needle  Arthrogram: No  Medications: 4 mL bupivacaine 0.25 %; 40 mg methylPREDNISolone acetate 40 MG/ML; 0.5 mL lidocaine 1 % Outcome: tolerated well, no immediate complications Procedure, treatment alternatives, risks and benefits explained, specific risks discussed. Consent was given by the patient. Immediately prior to procedure a time out was called to verify the correct patient, procedure, equipment, support staff and site/side marked as required. Patient was prepped and draped in the usual sterile fashion.       Clinical Data: No additional findings.   Subjective: Chief Complaint  Patient presents with  . Lower Back - Follow-up    11/10/16 L4-5 decompression for recurrent stenosis  . Left Shoulder - Pain    HPI 84 year old female returns with recurrent problems with right greater trochanteric pain.  Injection in June on 08/05/2017 gave her good relief for several months.  She states it still doing well but she is having increased problems with her left shoulder with outstretched reaching and overhead activity.  Review of Systems 14 point review of systems updated unchanged from 08/05/2017 office  visit.   Objective: Vital Signs: BP (!) 160/73   Pulse (!) 57   Ht 5\' 2"  (1.575 m)   Wt 135 lb (61.2 kg)   BMI 24.69 kg/m   Physical Exam  Constitutional: She is oriented to person, place, and time. She appears well-developed.  HENT:  Head: Normocephalic.  Right Ear: External ear normal.  Left Ear: External ear normal.  Eyes: Pupils are equal, round, and reactive to light.  Neck: No tracheal deviation present. No thyromegaly present.  Cardiovascular: Normal rate.  Pulmonary/Chest: Effort normal.  Abdominal: Soft.  Neurological: She is alert and oriented to person, place, and time.  Skin: Skin is warm and dry.  Psychiatric: She has a normal mood and affect. Her behavior is normal.    Ortho Exam patient has well-healed lumbar incision L4-5.  Minimal tenderness right greater trochanter she is amatory with a cane that she uses on her right hand.  Positive impingement left shoulder some pain with resisted supraspinatus testing.  No subluxation of the shoulder.  No brachial plexus tenderness no lymphadenopathy supraclavicular.  Upper extremity reflexes are 2+.  Opposite right shoulder shows negative impingement.  Mild tenderness long head of the biceps on the left.  Negative Yergason.  Specialty Comments:  No specialty comments available.  Imaging: No results found.   PMFS History: Patient Active Problem List   Diagnosis Date Noted  . Impingement syndrome of left shoulder 11/10/2017  .  History of lumbar laminectomy for spinal cord decompression 08/06/2017  . GI bleed 01/10/2017  . Atherosclerosis of native artery of both lower extremities with intermittent claudication ([place]) 08/08/2016  . Peripheral arterial disease ([place]) 08/04/2016  . Vitamin D deficiency 11/28/2015  . Osteopenia 09/16/2015  . Pain in joint, shoulder region 09/16/2015  . Spinal stenosis of lumbar region 01/24/2015  . Preventative health care 12/11/2014  . Essential hypertension 12/11/2014  . Pulmonary  hypertension ([place])   . Chronic cough   . Chronic diastolic heart failure ([place]) 05/15/2013  . Pre-diabetes 05/14/2013  . Other vitamin B12 deficiency anemia 07/13/2012  . Restless legs syndrome (RLS) 07/13/2012   Past Medical History:  Diagnosis Date  . Abnormality of gait 11/22/2014  . Arthritis   . Cyst of breast, right, benign solitary   . Dyslipidemia   . H/O: hysterectomy   . Heart murmur   . History of appendectomy   . History of lumbosacral spine surgery   . Hypertension   . Inguinal hernia   . Lumbar stenosis   . Restless leg syndrome     Family History  Problem Relation Age of Onset  . Heart disease Mother   . Diabetes Mother   . Heart attack Father   . Lung cancer Brother   . Healthy Sister   . Stomach cancer Son     Past Surgical History:  Procedure Laterality Date  . ABDOMINAL HYSTERECTOMY    . APPENDECTOMY    . BOWEL RESECTION N/A 03/23/2013   Procedure: SMALL BOWEL RESECTION;  Surgeon: Gwenyth Ober, MD;  Location: [facility];  Service: General;  Laterality: N/A;  . BREAST CYST EXCISION     LEFT  . ESOPHAGOGASTRODUODENOSCOPY N/A 04/01/2013   Procedure: ESOPHAGOGASTRODUODENOSCOPY (EGD);  Surgeon: Gwenyth Ober, MD;  Location: [facility];  Service: General;  Laterality: N/A;  . HERNIA REPAIR    . LAPAROTOMY N/A 03/23/2013   Procedure: EXPLORATORY LAPAROTOMY;  Surgeon: Gwenyth Ober, MD;  Location: [facility];  Service: General;  Laterality: N/A;  . LUMBAR LAMINECTOMY/DECOMPRESSION MICRODISCECTOMY N/A 11/10/2016   Procedure: L4-5 DECOMPRESSION FOR RECURRENT STENOSIS;  Surgeon: Marybelle Killings, MD;  Location: [facility];  Service: Orthopedics;  Laterality: N/A;  . PEG PLACEMENT N/A 04/01/2013   Procedure: PERCUTANEOUS ENDOSCOPIC GASTROSTOMY (PEG) PLACEMENT;  Surgeon: Gwenyth Ober, MD;  Location: [facility];  Service: General;  Laterality: N/A;  . TRACHEOSTOMY     feinstein   Social History   Occupational History  . Occupation: retired  Tobacco Use  . Smoking status:  Former Smoker    Packs/day: 0.00    Types: Cigarettes    Last attempt to quit: 02/25/2003    Years since quitting: 14.7  . Smokeless tobacco: Never Used  Substance and Sexual Activity  . Alcohol use: Yes    Comment: About 2 drinks per night  . Drug use: No  . Sexual activity: Not on file

## 2017-11-11 ENCOUNTER — Ambulatory Visit: Payer: Medicare Other | Admitting: Internal Medicine

## 2017-11-11 ENCOUNTER — Other Ambulatory Visit: Payer: Self-pay

## 2017-11-11 ENCOUNTER — Encounter: Payer: Self-pay | Admitting: Internal Medicine

## 2017-11-11 VITALS — BP 154/69 | HR 58 | Temp 98.2°F | Ht 62.0 in | Wt 134.2 lb

## 2017-11-11 DIAGNOSIS — Z23 Encounter for immunization: Secondary | ICD-10-CM | POA: Diagnosis not present

## 2017-11-11 DIAGNOSIS — I5032 Chronic diastolic (congestive) heart failure: Secondary | ICD-10-CM | POA: Diagnosis not present

## 2017-11-11 DIAGNOSIS — Z79899 Other long term (current) drug therapy: Secondary | ICD-10-CM

## 2017-11-11 DIAGNOSIS — I110 Hypertensive heart disease with heart failure: Secondary | ICD-10-CM | POA: Diagnosis not present

## 2017-11-11 DIAGNOSIS — R7303 Prediabetes: Secondary | ICD-10-CM

## 2017-11-11 DIAGNOSIS — I739 Peripheral vascular disease, unspecified: Secondary | ICD-10-CM | POA: Diagnosis not present

## 2017-11-11 DIAGNOSIS — M8580 Other specified disorders of bone density and structure, unspecified site: Secondary | ICD-10-CM

## 2017-11-11 DIAGNOSIS — I10 Essential (primary) hypertension: Secondary | ICD-10-CM

## 2017-11-11 LAB — POCT GLYCOSYLATED HEMOGLOBIN (HGB A1C): Hemoglobin A1C: 5.8 % — AB (ref 4.0–5.6)

## 2017-11-11 LAB — GLUCOSE, CAPILLARY: Glucose-Capillary: 146 mg/dL — ABNORMAL HIGH (ref 70–99)

## 2017-11-11 NOTE — Progress Notes (Signed)
   CC: Hypertension   HPI:  Kristi Alexander is a 84 y.o. with a PMHx of chronic diastolic HF, HTN, PAD, osteopenia, and pre-diabetes presenting for a routine follow up for her hypertension. She reported doing well and denied any complaints. She is following a podiatrist closely for a foot fungal infection for which she had a toenail removed and is scheduled to remove another. She recently saw an orthopedist for a shoulder injection for a bursitis and reports feeling well. She reported her last eye exam was in spring of this year at [facility].   For details of today's visit and the status of her chronic medical issues please refer to the assessment and plan.   Past Medical History:  Diagnosis Date  . Abnormality of gait 11/22/2014  . Arthritis   . Cyst of breast, right, benign solitary   . Dyslipidemia   . H/O: hysterectomy   . Heart murmur   . History of appendectomy   . History of lumbosacral spine surgery   . Hypertension   . Inguinal hernia   . Lumbar stenosis   . Restless leg syndrome    Review of Systems:    Review of Systems  Constitutional: Negative for fever and weight loss.  Respiratory: Negative for cough.   Cardiovascular: Negative for chest pain and leg swelling.  Gastrointestinal: Negative for abdominal pain, constipation, nausea and vomiting.  Musculoskeletal: Positive for joint pain.  Neurological: Negative for weakness and headaches.    Physical Exam:  Vitals:   11/11/17 1344 11/11/17 1412  BP: (!) 183/58 (!) 154/69  Pulse: (!) 57 (!) 58  Temp: 98.2 F (36.8 C)   TempSrc: Oral   SpO2: 100% 97%  Weight: 134 lb 3.2 oz (60.9 kg)   Height: 5\' 2"  (1.575 m)     Physical Exam  Constitutional: She is oriented to person, place, and time and well-developed, well-nourished, and in no distress.  Cardiovascular: Normal rate, regular rhythm and normal heart sounds.  No murmur heard. Pulmonary/Chest: Effort normal and breath sounds normal. No respiratory  distress. She has no wheezes.  Abdominal: Soft. Bowel sounds are normal. She exhibits no distension.  Musculoskeletal: She exhibits no edema.  Neurological: She is alert and oriented to person, place, and time.  Skin: Skin is warm and dry.    Assessment & Plan:   See Encounters Tab for problem based charting.  Patient seen with Dr. Daryll Drown

## 2017-11-11 NOTE — Assessment & Plan Note (Addendum)
Last Hgb A1c 6.0. Repeated POCT Hgb A1c today, 5.8. Patient reported she does not eat much sugar and has a good diet. Recommended to continue her current lifestyle. Will recheck in ~6 months.

## 2017-11-11 NOTE — Assessment & Plan Note (Signed)
Patient initially had a BP of 183/58; repeat BP was 154/69. Patient and her daughter reported they check BP at home and it is usually in the 130s-140s range and that the patient has a history of elevated BP readings when visiting the doctor. Advised to continue her current BP regimen and follow up in 3 months.  - continue Losartan 50 mg daily, metoprolol 25 mg ER, amlodipine 10 mg, spironolactone 12.5 mg, Imdur 30 mg  - follow up in 3 months

## 2017-11-11 NOTE — Patient Instructions (Addendum)
Kristi Alexander,  It was a pleasure meeting you today! You're doing great! Continue taking your medications as you are. I will see you again in 3 months!  Thanks for allowing [place] to be a part of your care!

## 2017-11-11 NOTE — Assessment & Plan Note (Signed)
Patient was found to have osteopenia on her last DEXA scan. During her last office visit she was switched from high dose vitamin D to calcium-vitamin D supplements which she is tolerating well. Her pharmacy has been out of these supplements for the past week but she should be getting them soon.  - continue calcium carb-cholecalciferol 600-400 mg unit tabs

## 2017-11-19 NOTE — Progress Notes (Signed)
Internal Medicine Clinic Attending  I saw and evaluated the patient.  I personally confirmed the key portions of the history and exam documented by Dr.  Rehman  and I reviewed pertinent patient test results.  The assessment, diagnosis, and plan were formulated together and I agree with the documentation in the resident's note.  

## 2017-11-25 ENCOUNTER — Telehealth: Payer: Self-pay | Admitting: *Deleted

## 2017-11-25 NOTE — Telephone Encounter (Signed)
Received fax from [facility] requesting refill on Vit D2 50,000 units. No loger on current med list. Please advise. Hubbard Hartshorn, RN, BSN

## 2017-12-01 MED ORDER — VITAMIN D (ERGOCALCIFEROL) 1.25 MG (50000 UNIT) PO CAPS: 50000.0000 [IU] | ORAL_CAPSULE | ORAL | 3 refills | Status: DC

## 2017-12-01 NOTE — Telephone Encounter (Signed)
Per chart review patient has a chronic history of vit D deficiency. We can go ahead and refill this prescription. Do I need to e-prescribe? Thanks!

## 2017-12-01 NOTE — Telephone Encounter (Signed)
Done. Thanks.

## 2018-03-10 ENCOUNTER — Encounter: Payer: Self-pay | Admitting: Internal Medicine

## 2018-03-10 ENCOUNTER — Other Ambulatory Visit: Payer: Self-pay

## 2018-03-10 ENCOUNTER — Ambulatory Visit: Payer: Medicare Other | Admitting: Internal Medicine

## 2018-03-10 VITALS — BP 135/64 | HR 100 | Temp 99.1°F | Wt 133.7 lb

## 2018-03-10 DIAGNOSIS — Z789 Other specified health status: Secondary | ICD-10-CM | POA: Diagnosis not present

## 2018-03-10 DIAGNOSIS — I10 Essential (primary) hypertension: Secondary | ICD-10-CM | POA: Diagnosis not present

## 2018-03-10 DIAGNOSIS — Z79899 Other long term (current) drug therapy: Secondary | ICD-10-CM

## 2018-03-10 DIAGNOSIS — L299 Pruritus, unspecified: Secondary | ICD-10-CM

## 2018-03-10 NOTE — Patient Instructions (Signed)
Thank you for allowing [place] to provide your care today.  You came in for blood pressure follow-up. Your blood pressure today is  .135/64 which is great. Please continue good work and taking your medication as instructed before.  I order some lab work to be done today. I will call you if results come back abnormal.  Today I did not make any changes to your medications.    Please follow-up in clinic in 6 months or earlier as needed. Should you have any questions or concerns please call the internal medicine clinic at [phone].

## 2018-03-10 NOTE — Progress Notes (Signed)
   CC: Hypertension follow-up  HPI:  [name] is a 85 y.o. female with past medical history as listed below, came into the clinic today for hypertension follow-up.  Please refer to problem based charting for further details and assessment and plan.  Past Medical History:  Diagnosis Date  . Abnormality of gait 11/22/2014  . Arthritis   . Cyst of breast, right, benign solitary   . Dyslipidemia   . H/O: hysterectomy   . Heart murmur   . History of appendectomy   . History of lumbosacral spine surgery   . Hypertension   . Inguinal hernia   . Lumbar stenosis   . Restless leg syndrome    Review of Systems:  Review of Systems  Constitutional: Negative for chills and fever.  Respiratory: Negative for shortness of breath and wheezing.   Cardiovascular: Negative for chest pain.  Skin: Positive for itching.  Neurological: Negative for dizziness and headaches.   Physical Exam:  Vitals:   03/10/18 1607  BP: 135/64  Pulse: 100  Temp: 99.1 F (37.3 C)  TempSrc: Oral  SpO2: 100%  Weight: 133 lb 11.2 oz (60.6 kg)   Physical Exam Vitals signs and nursing note reviewed.  Constitutional:      Appearance: Normal appearance.  HENT:     Head: Normocephalic and atraumatic.  Eyes:     Extraocular Movements: Extraocular movements intact.  Cardiovascular:     Rate and Rhythm: Normal rate and regular rhythm.     Pulses: Normal pulses.     Heart sounds: Normal heart sounds.  Pulmonary:     Effort: No respiratory distress.     Breath sounds: No wheezing or rales.  Abdominal:     General: There is no distension.     Palpations: Abdomen is soft.     Tenderness: There is no abdominal tenderness.  Musculoskeletal:     Right lower leg: No edema.     Left lower leg: No edema.  Neurological:     General: No focal deficit present.     Mental Status: She is alert and oriented to person, place, and time.     Motor: No weakness.  Psychiatric:        Mood and Affect: Mood normal.         Behavior: Behavior normal.        Thought Content: Thought content normal.        Judgment: Judgment normal.     Assessment & Plan:   See Encounters Tab for problem based charting.  Patient discussed with Dr. Lynnae January

## 2018-03-10 NOTE — Assessment & Plan Note (Signed)
Patient has been on iron supplement.  She mentions that she takes it on and off due to constipation.  Sometimes feel having low energy. Last CBC 10 months ago hemoglobin stable at 11.5 and elevated RDW.  Ferritin last time checked 2.5 years ago he was normal. I will check ferritin today and may decide about stopping or continuing ferritin since patient complaining of constipation that.  (No evidence of sever inflammation or infection today to interfere with result) -Ferritin

## 2018-03-10 NOTE — Assessment & Plan Note (Signed)
Ms. Uliano is doing well.  Denies any dizziness, headache. She reports that her blood pressure at home has been around 135-140/70s. Today blood pressure is 135/64.  I will keep her current antihypertensive regimen.    -BMP today -Continue amlodipine 10 mg daily, losartan 50 mg daily, metoprolol 25 mg ER daily, spironolactone 12.5 mg daily, Imdur 30 mg daily -Follow-up in clinic in 6 months or sooner as needed

## 2018-03-11 LAB — BMP8+ANION GAP
Anion Gap: 15 mmol/L (ref 10.0–18.0)
BUN/Creatinine Ratio: 21 (ref 12–28)
BUN: 19 mg/dL (ref 10–36)
CO2: 21 mmol/L (ref 20–29)
CREATININE: 0.89 mg/dL (ref 0.57–1.00)
Calcium: 9.9 mg/dL (ref 8.7–10.3)
Chloride: 105 mmol/L (ref 96–106)
GFR calc Af Amer: 66 mL/min/{1.73_m2} (ref >59)
GFR calc non Af Amer: 57 mL/min/{1.73_m2} — ABNORMAL LOW (ref >59)
Glucose: 86 mg/dL (ref 65–99)
Potassium: 4.7 mmol/L (ref 3.5–5.2)
SODIUM: 141 mmol/L (ref 134–144)

## 2018-03-11 LAB — FERRITIN: Ferritin: 33 ng/mL (ref 15–150)

## 2018-03-12 NOTE — Progress Notes (Signed)
Internal Medicine Clinic Attending  Case discussed with Dr. Masoudi  at the time of the visit.  We reviewed the resident's history and exam and pertinent patient test results.  I agree with the assessment, diagnosis, and plan of care documented in the resident's note.  

## 2018-04-23 ENCOUNTER — Encounter: Payer: Self-pay | Admitting: Internal Medicine

## 2018-04-23 ENCOUNTER — Ambulatory Visit (INDEPENDENT_AMBULATORY_CARE_PROVIDER_SITE_OTHER): Payer: Medicare Other | Admitting: Internal Medicine

## 2018-04-23 ENCOUNTER — Other Ambulatory Visit: Payer: Self-pay

## 2018-04-23 VITALS — BP 177/65 | HR 60 | Temp 98.0°F | Ht 62.0 in | Wt 136.3 lb

## 2018-04-23 DIAGNOSIS — Z79899 Other long term (current) drug therapy: Secondary | ICD-10-CM | POA: Diagnosis not present

## 2018-04-23 DIAGNOSIS — I10 Essential (primary) hypertension: Secondary | ICD-10-CM

## 2018-04-23 MED ORDER — HYDROCHLOROTHIAZIDE 25 MG PO TABS: 25.0000 mg | ORAL_TABLET | Freq: Every day | ORAL | 2 refills | Status: DC

## 2018-04-23 MED ORDER — SPIRONOLACTONE 50 MG PO TABS: 50.0000 mg | ORAL_TABLET | Freq: Every day | ORAL | 2 refills | Status: DC

## 2018-04-23 MED ORDER — SPIRONOLACTONE 50 MG PO TABS: 25.0000 mg | ORAL_TABLET | Freq: Every day | ORAL | 2 refills | Status: DC

## 2018-04-23 NOTE — Assessment & Plan Note (Signed)
Patient is here with complaint of generalized malaise, fatigue, and elevated blood pressures.  Her blood pressures have been running in the 544B systolic at home.  Extensive review of systems is negative aside from some transient diarrhea, now resolved.  Unclear reason for fatigue, but suspect barely elevated blood pressures may be contributing.  Blood pressure is 164/80 here in clinic and 177/65 on recheck.  Compliant with her amlodipine 10 mg daily, losartan 50 mg daily, metoprolol 25 mg daily, spironolactone 12.5 mg daily, and Imdur 30 mg daily.  Plan: --Increase spironolactone to 25 mg daily - Start hydrochlorothiazide 25 mg daily -Continue other medications -Follow-up 1 week; repeat BMET at follow-up for potassium   ** Spironolactone prescription sent for 50 mg daily. Patient's med rec stated she was taking 25 mg daily - but patient later informed me that she has only been taking 1/2 tablet. Instructed patient to take only 1/2 tablet of her new prescription. Given written instructions.

## 2018-04-23 NOTE — Patient Instructions (Addendum)
Kristi Alexander,  It was a pleasure to meet you. I am sorry to hear you aren't feeling well. I have increased the dose of your spironolactone to 25 mg daily and added another medication called hydrochlorothiazide. Please take both of these once a day. Continue your other medications as previously prescribed.  Follow up with [place] again in 1 week for your blood pressure and lab work. If you have any questions or concerns, call our clinic at [phone] or after hours call [phone] and ask for the internal medicine resident on call. Thank you!  Dr. Philipp Ovens

## 2018-04-23 NOTE — Progress Notes (Signed)
   CC: HTN  HPI:  Kristi Alexander is a 85 y.o. female with past medical history outlined below here for HTN. For the details of today's visit, please refer to the assessment and plan.  Past Medical History:  Diagnosis Date  . Abnormality of gait 11/22/2014  . Arthritis   . Cyst of breast, right, benign solitary   . Dyslipidemia   . H/O: hysterectomy   . Heart murmur   . History of appendectomy   . History of lumbosacral spine surgery   . Hypertension   . Inguinal hernia   . Lumbar stenosis   . Restless leg syndrome     Review of Systems  Constitutional: Positive for malaise/fatigue.  Respiratory: Negative for shortness of breath.   Cardiovascular: Negative for chest pain.    Physical Exam:  Vitals:   04/23/18 1039  BP: (!) 164/80  Pulse: 68  Temp: 98 F (36.7 C)  TempSrc: Oral  SpO2: 98%  Weight: 136 lb 4.8 oz (61.8 kg)  Height: 5\' 2"  (1.575 m)    Constitutional: NAD, appears comfortable Cardiovascular: RRR, no m/r/g Pulmonary/Chest: CTAB, no wheezes, rales, or rhonchi.  Extremities: Warm and well perfused. No edema.  Neurological: A&Ox3, CN II - XII grossly intact.  Skin: No rashes or erythema  Psychiatric: Normal mood and affect  Assessment & Plan:   See Encounters Tab for problem based charting.  Patient discussed with Dr. Daryll Drown

## 2018-04-26 NOTE — Progress Notes (Signed)
Internal Medicine Clinic Attending  Case discussed with Dr. Guilloud at the time of the visit.  We reviewed the resident's history and exam and pertinent patient test results.  I agree with the assessment, diagnosis, and plan of care documented in the resident's note.  

## 2018-04-27 ENCOUNTER — Ambulatory Visit (INDEPENDENT_AMBULATORY_CARE_PROVIDER_SITE_OTHER): Payer: Medicare Other

## 2018-04-27 ENCOUNTER — Ambulatory Visit (HOSPITAL_COMMUNITY)
Admission: EM | Admit: 2018-04-27 | Discharge: 2018-04-27 | Disposition: A | Discharge status: Home / Self Care | Payer: Medicare Other | Attending: Family Medicine | Admitting: Family Medicine

## 2018-04-27 ENCOUNTER — Encounter (HOSPITAL_COMMUNITY): Payer: Self-pay | Admitting: Emergency Medicine

## 2018-04-27 DIAGNOSIS — I5042 Chronic combined systolic (congestive) and diastolic (congestive) heart failure: Secondary | ICD-10-CM | POA: Diagnosis present

## 2018-04-27 DIAGNOSIS — R0602 Shortness of breath: Secondary | ICD-10-CM

## 2018-04-27 DIAGNOSIS — R05 Cough: Secondary | ICD-10-CM | POA: Diagnosis not present

## 2018-04-27 DIAGNOSIS — Z87891 Personal history of nicotine dependence: Secondary | ICD-10-CM | POA: Diagnosis not present

## 2018-04-27 LAB — POCT URINALYSIS DIP (DEVICE)
Bilirubin Urine: NEGATIVE
Glucose, UA: NEGATIVE mg/dL
Hgb urine dipstick: NEGATIVE
Ketones, ur: NEGATIVE mg/dL
Nitrite: NEGATIVE
Protein, ur: 100 mg/dL — AB
Specific Gravity, Urine: 1.015 (ref 1.005–1.030)
Urobilinogen, UA: 0.2 mg/dL (ref 0.0–1.0)
pH: 7.5 (ref 5.0–8.0)

## 2018-04-27 LAB — BASIC METABOLIC PANEL
ANION GAP: 9 (ref 5–15)
BUN: 18 mg/dL (ref 8–23)
CO2: 26 mmol/L (ref 22–32)
Calcium: 9.3 mg/dL (ref 8.9–10.3)
Chloride: 105 mmol/L (ref 98–111)
Creatinine, Ser: 1.15 mg/dL — ABNORMAL HIGH (ref 0.44–1.00)
GFR calc non Af Amer: 42 mL/min — ABNORMAL LOW (ref >60)
GFR, EST AFRICAN AMERICAN: 48 mL/min — AB (ref >60)
Glucose, Bld: 113 mg/dL — ABNORMAL HIGH (ref 70–99)
Potassium: 4.3 mmol/L (ref 3.5–5.1)
Sodium: 140 mmol/L (ref 135–145)

## 2018-04-27 LAB — CBC
HEMATOCRIT: 38.9 % (ref 36.0–46.0)
Hemoglobin: 12.1 g/dL (ref 12.0–15.0)
MCH: 28.5 pg (ref 26.0–34.0)
MCHC: 31.1 g/dL (ref 30.0–36.0)
MCV: 91.5 fL (ref 80.0–100.0)
NRBC: 0 % (ref 0.0–0.2)
Platelets: 256 10*3/uL (ref 150–400)
RBC: 4.25 MIL/uL (ref 3.87–5.11)
RDW: 16.8 % — ABNORMAL HIGH (ref 11.5–15.5)
WBC: 6.6 10*3/uL (ref 4.0–10.5)

## 2018-04-27 NOTE — ED Provider Notes (Signed)
[place]    CSN: [id number] Arrival date & time: 04/27/18  2409     History   Chief Complaint Chief Complaint  Patient presents with  . Shortness of Breath    HPI LING FLESCH is a 85 y.o. female.   HPI  Patient is here for increased shortness of breath.  She states that she is having coughing.  Some clear mucus.  She just "feels bad".  She states that this is been worsening over about a 1 week period of time.  She feels more tired.  Less energy to leave the house or get things done.  She is here with her daughter.  No fever or chills.  No runny or stuffy nose.  No sore throat.  No nausea vomiting diarrhea.  Appetite is poor.  She went to her family doctor on 04/23/2018.  Reported not feeling well.  Also reported that her blood pressure had been elevated.  Her blood pressure medication was adjusted, and she was asked to come back in 1 week.  She does have an appointment tomorrow.  She is supposed to get a metabolic panel tomorrow to recheck her electrolytes and renal function.  We will get that today She has no chest pain or pressure.  She is short of breath in general, not just with exertion.  No swelling in her ankles.  She does avoid salt in her diet. No known underlying lung disease such as COPD. She does have a history of chronic CHF  Past Medical History:  Diagnosis Date  . Abnormality of gait 11/22/2014  . Arthritis   . Cyst of breast, right, benign solitary   . Dyslipidemia   . H/O: hysterectomy   . Heart murmur   . History of appendectomy   . History of lumbosacral spine surgery   . Hypertension   . Inguinal hernia   . Lumbar stenosis   . Restless leg syndrome     Patient Active Problem List   Diagnosis Date Noted  . Takes iron supplements 03/10/2018  . Impingement syndrome of left shoulder 11/10/2017  . History of lumbar laminectomy for spinal cord decompression 08/06/2017  . GI bleed 01/10/2017  . Atherosclerosis of native artery of both lower  extremities with intermittent claudication ([place]) 08/08/2016  . Peripheral arterial disease ([place]) 08/04/2016  . Vitamin D deficiency 11/28/2015  . Osteopenia 09/16/2015  . Pain in joint, shoulder region 09/16/2015  . Spinal stenosis of lumbar region 01/24/2015  . Preventative health care 12/11/2014  . Essential hypertension 12/11/2014  . Pulmonary hypertension ([place])   . Chronic cough   . Chronic diastolic heart failure ([place]) 05/15/2013  . Pre-diabetes 05/14/2013  . Other vitamin B12 deficiency anemia 07/13/2012  . Restless legs syndrome (RLS) 07/13/2012    Past Surgical History:  Procedure Laterality Date  . ABDOMINAL HYSTERECTOMY    . APPENDECTOMY    . BOWEL RESECTION N/A 03/23/2013   Procedure: SMALL BOWEL RESECTION;  Surgeon: Gwenyth Ober, MD;  Location: [facility];  Service: General;  Laterality: N/A;  . BREAST CYST EXCISION     LEFT  . ESOPHAGOGASTRODUODENOSCOPY N/A 04/01/2013   Procedure: ESOPHAGOGASTRODUODENOSCOPY (EGD);  Surgeon: Gwenyth Ober, MD;  Location: [facility];  Service: General;  Laterality: N/A;  . HERNIA REPAIR    . LAPAROTOMY N/A 03/23/2013   Procedure: EXPLORATORY LAPAROTOMY;  Surgeon: Gwenyth Ober, MD;  Location: [facility];  Service: General;  Laterality: N/A;  . LUMBAR LAMINECTOMY/DECOMPRESSION MICRODISCECTOMY N/A 11/10/2016   Procedure:  L4-5 DECOMPRESSION FOR RECURRENT STENOSIS;  Surgeon: Marybelle Killings, MD;  Location: [facility];  Service: Orthopedics;  Laterality: N/A;  . PEG PLACEMENT N/A 04/01/2013   Procedure: PERCUTANEOUS ENDOSCOPIC GASTROSTOMY (PEG) PLACEMENT;  Surgeon: Gwenyth Ober, MD;  [facility];  Service: General;  Laterality: N/A;  . TRACHEOSTOMY     feinstein    OB History   No obstetric history on file.      Home Medications    Prior to Admission medications   Medication Sig Start Date End Date Taking? Authorizing Provider  acetaminophen (TYLENOL) 500 MG tablet Take 500 mg every 6 (six) hours as needed by mouth (pain).    [provider]  amLODipine (NORVASC) 10 MG tablet TAKE 1 TABLET BY MOUTH ONCE DAILY 08/24/17   Annia Belt, MD  b complex vitamins tablet Take 1 tablet by mouth daily.    [provider]  Calcium Carb-Cholecalciferol (CALCIUM-VITAMIN D) 600-400 MG-UNIT TABS TAKE 1 TABLET BY MOUTH ONCE DAILY 11/05/17   Rehman, Areeg N, DO  cetirizine (ZYRTEC) 10 MG tablet Take 10 mg daily as needed by mouth for allergies.    [provider]  diclofenac sodium (VOLTAREN) 1 % GEL Apply 2 g topically 4 (four) times daily. 04/15/17   Maryellen Pile, MD  ferrous sulfate 325 (65 FE) MG tablet Take 325 mg daily with breakfast by mouth.    [provider]  fluticasone (FLONASE) 50 MCG/ACT nasal spray Place 1 spray daily as needed into both nostrils for allergies or rhinitis.    [provider]  hydrochlorothiazide (HYDRODIURIL) 25 MG tablet Take 1 tablet (25 mg total) by mouth daily. 04/23/18   Velna Ochs, MD  hydrOXYzine (ATARAX/VISTARIL) 25 MG tablet TAKE 1 TABLET BY MOUTH ONCE DAILY AS NEEDED 09/18/17   Oda Kilts, MD  isosorbide mononitrate (IMDUR) 30 MG 24 hr tablet TAKE 1 & 1/2 (ONE & ONE-HALF) TABLETS BY MOUTH ONCE DAILY 07/29/17   Maryellen Pile, MD  losartan (COZAAR) 50 MG tablet  03/14/17   [provider]  metoprolol succinate (TOPROL-XL) 25 MG 24 hr tablet Take 1 tablet (25 mg total) by mouth daily. 05/08/17   Maryellen Pile, MD  psyllium (METAMUCIL) 0.52 g capsule Take 2 capsules (1.04 g total) daily by mouth. 01/12/17   Santos-Sanchez, Merlene Morse, MD  spironolactone (ALDACTONE) 50 MG tablet Take 0.5 tablets (25 mg total) by mouth daily. 04/23/18   Velna Ochs, MD  terbinafine (LAMISIL) 250 MG tablet Take 250 mg by mouth daily. 10/09/17   [provider]  Vitamin D, Ergocalciferol, (DRISDOL) 50000 units CAPS capsule Take 1 capsule (50,000 Units total) by mouth every 7 (seven) days. 12/01/17   Rehman, Areeg N, DO    Family History Family  History  Problem Relation Age of Onset  . Heart disease Mother   . Diabetes Mother   . Heart attack Father   . Lung cancer Brother   . Healthy Sister   . Stomach cancer Son     Social History Social History   Tobacco Use  . Smoking status: Former Smoker    Packs/day: 0.00    Types: Cigarettes    Last attempt to quit: 02/25/2003    Years since quitting: 15.1  . Smokeless tobacco: Never Used  Substance Use Topics  . Alcohol use: Yes    Comment: About 2 drinks per night  . Drug use: No     Allergies   Penicillins and Pravastatin   Review of Systems  Review of Systems  Constitutional: Positive for activity change, appetite change and fatigue. Negative for chills and fever.  HENT: Negative for dental problem, ear pain, rhinorrhea and sore throat.   Eyes: Negative for pain and visual disturbance.  Respiratory: Positive for cough and shortness of breath.   Cardiovascular: Negative for chest pain, palpitations and leg swelling.  Gastrointestinal: Negative for abdominal pain, nausea and vomiting.  Genitourinary: Negative for dysuria, frequency and hematuria.  Musculoskeletal: Negative for arthralgias and back pain.  Skin: Negative for color change and rash.  Neurological: Negative for seizures and syncope.  Psychiatric/Behavioral: Negative for sleep disturbance. The patient is not nervous/anxious.   All other systems reviewed and are negative.    Physical Exam Triage Vital Signs ED Triage Vitals  Enc Vitals Group     BP 04/27/18 1042 (!) 159/87     Pulse Rate 04/27/18 1042 77     Resp 04/27/18 1047 (!) 22     Temp 04/27/18 1042 97.7 F (36.5 C)     Temp src --      SpO2 04/27/18 1042 99 %   No data found.  Updated Vital Signs BP (!) 159/87   Pulse 77   Temp 97.7 F (36.5 C)   Resp (!) 22   SpO2 99%      Physical Exam Constitutional:      General: She is not in acute distress.    Appearance: She is well-developed and normal weight.     Comments: Pleasant.   No acute distress.  Does appear tired  HENT:     Head: Normocephalic and atraumatic.     Mouth/Throat:     Mouth: Mucous membranes are moist.  Eyes:     Conjunctiva/sclera: Conjunctivae normal.     Pupils: Pupils are equal, round, and reactive to light.  Neck:     Musculoskeletal: Normal range of motion.     Vascular: No hepatojugular reflux.  Cardiovascular:     Rate and Rhythm: Normal rate and regular rhythm.     Heart sounds: Normal heart sounds. No murmur.  Pulmonary:     Effort: Pulmonary effort is normal. No respiratory distress.     Comments: Crackles in both bases Abdominal:     General: Bowel sounds are normal. There is no distension.     Palpations: Abdomen is soft.  Musculoskeletal: Normal range of motion.     Right lower leg: No edema.     Left lower leg: No edema.  Skin:    General: Skin is warm and dry.  Neurological:     General: No focal deficit present.     Mental Status: She is alert.  Psychiatric:        Mood and Affect: Mood normal.        Behavior: Behavior normal.      UC Treatments / Results  Labs (all labs ordered are listed, but only abnormal results are displayed) Labs Reviewed  CBC - Abnormal; Notable for the following components:      Result Value   RDW 16.8 (*)    All other components within normal limits  BASIC METABOLIC PANEL - Abnormal; Notable for the following components:   Glucose, Bld 113 (*)    Creatinine, Ser 1.15 (*)    GFR calc non Af Amer 42 (*)    GFR calc Af Amer 48 (*)    All other components within normal limits  POCT URINALYSIS DIP (DEVICE) - Abnormal; Notable for the following components:  Protein, ur 100 (*)    Leukocytes,Ua TRACE (*)    All other components within normal limits    EKG None  Radiology Dg Chest 2 View  Result Date: 04/27/2018 CLINICAL DATA:  Patient states that she has cough, sob and weakness x few days. Hx of bronchitis and htn. Former smoker EXAM: CHEST - 2 VIEW COMPARISON:  11/05/2016 and  older studies. FINDINGS: Cardiac silhouette is mildly enlarged. No mediastinal or hilar masses. No convincing adenopathy. There are irregularly thickened interstitial markings bilaterally which are increased when compared to the prior chest radiograph. This has a lower lung zone predominance. No lung consolidation. No pleural effusion or pneumothorax. Skeletal structures are demineralized but intact. IMPRESSION: 1. Mild cardiomegaly with bilateral irregular interstitial thickening, increased when compared to the prior CT chest radiographs. Suspect congestive heart failure with interstitial edema superimposed on chronic changes. Alternatively, findings may reflect progressive interstitial lung disease, which could further assessed with high-resolution chest CT. Diffuse interstitial infection is also a possibility. Electronically Signed   By: Lajean Manes M.D.   On: 04/27/2018 11:17    Procedures Procedures (including critical care time)  Medications Ordered in UC Medications - No data to display  Initial Impression / Assessment and Plan / UC Course  I have reviewed the triage vital signs and the nursing notes.  Pertinent labs & imaging results that were available during my care of the patient were reviewed by me and considered in my medical decision making (see chart for details).     Discussed with patient that her tiredness appears to be fluid overload from congestive heart failure.  This will be managed by her primary care doctor tomorrow.  No evidence of pneumonia or infection.  Her CBC and UA are normal.  BMP is pending.  I will call her when I have this test result available.  Is reminded not to eat salt.  Activity as tolerated.  BMP is reviewed .  There is a slight bump in Cr.  This is likely the increased diuretic.  Managing her CHF with the delicate fluid balance will be a challenge. She was felt stable for discharge Greater than 50% of this visit was spent in counseling and coordinating  care.  Total face to face time: 79min reviewing records, testing. Exam and re exam patient , educating family and pat on CHF  Final Clinical Impressions(s) / UC Diagnoses   Final diagnoses:  SOB (shortness of breath)  Chronic combined systolic and diastolic congestive heart failure [facility])     Discharge Instructions     Continue same medicines No salt In diet See your doctor tomorrow I will call you with test results    ED Prescriptions    None     Controlled Substance Prescriptions [place] Controlled Substance Registry consulted? Not Applicable   Raylene Everts, MD 04/27/18 2148

## 2018-04-27 NOTE — ED Triage Notes (Signed)
Pt c/o cough, sob, states she has bad allergies. States shes been feeling bad for the last week.

## 2018-04-27 NOTE — Discharge Instructions (Addendum)
Continue same medicines No salt In diet See your doctor tomorrow I will call you with test results

## 2018-04-28 ENCOUNTER — Encounter: Payer: Self-pay | Admitting: Internal Medicine

## 2018-04-28 ENCOUNTER — Other Ambulatory Visit: Payer: Self-pay

## 2018-04-28 ENCOUNTER — Ambulatory Visit: Payer: Medicare Other | Admitting: Internal Medicine

## 2018-04-28 ENCOUNTER — Other Ambulatory Visit: Payer: Self-pay | Admitting: Internal Medicine

## 2018-04-28 DIAGNOSIS — R197 Diarrhea, unspecified: Secondary | ICD-10-CM | POA: Diagnosis not present

## 2018-04-28 DIAGNOSIS — R21 Rash and other nonspecific skin eruption: Secondary | ICD-10-CM

## 2018-04-28 DIAGNOSIS — J849 Interstitial pulmonary disease, unspecified: Secondary | ICD-10-CM | POA: Insufficient documentation

## 2018-04-28 DIAGNOSIS — R0602 Shortness of breath: Secondary | ICD-10-CM

## 2018-04-28 DIAGNOSIS — Z79899 Other long term (current) drug therapy: Secondary | ICD-10-CM

## 2018-04-28 DIAGNOSIS — R531 Weakness: Secondary | ICD-10-CM | POA: Diagnosis not present

## 2018-04-28 DIAGNOSIS — R42 Dizziness and giddiness: Secondary | ICD-10-CM

## 2018-04-28 DIAGNOSIS — M1990 Unspecified osteoarthritis, unspecified site: Secondary | ICD-10-CM

## 2018-04-28 NOTE — Patient Instructions (Signed)
Thank you for allowing [place] to provide your care today. Today we discussed your shortness of breath.   I have a scheduled an Echocardiogram and CT Scan of your lungs.   Today we made the following changes to your medications:   Please START taking  Please STOP taking   Please follow-up in one week after completing your imaging.    Should you have any questions or concerns please call the internal medicine clinic at [phone].

## 2018-04-28 NOTE — Assessment & Plan Note (Addendum)
Patient presents with generalized weakness and some SOB over the past week. She denies cough, fever, chills. Her symptoms initially presented with high blood pressure as well. She takes norvasc 10mg  qd, toprol-XL 25mg  qd, losartan 50mg  qd, Imdur 30mg  qd and spironolactone, which was increased one week ago to 50mg  qd in addition to starting HCTZ 25mg  due to elevated blood pressures. She states she has also felt off balance while walking. Yesterday her SOB was acutely worse and she went to urgent care. Chest xray showed interstitial markings significant for possible edema vs. Interstitial lung disease. Previous abdominal CTs have shown some fibrosis. She additionally has a clinical diagnosis of heart failure and last ECHO was 2016. She does not show clinical signs of heart failure today but this was noted on previous exams. She is healthy for her 89 years, and I believe would benefit from CT. She and her daughter are in agreement.   - orthostatics ordered and were normal  - CT chest without contrast  - complete ECHO  - f/u one week for repeat BMP  ADDENDUM: Discussed CT findings of ILD with patient and her daughter with unclear etiology although previous CTs do show slow increase in ILD and scarring over time. 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 She has noticed a small rash on her shoulder recently and has osteoarthritis. She is agreeable to follow-up next week for lab work.

## 2018-04-28 NOTE — Progress Notes (Signed)
   CC: weakness  HPI:  [name] is a 85 y.o. with PMH as below.   Please see A&P for assessment of the patient's acute and chronic medical conditions.     Past Medical History:  Diagnosis Date  . Abnormality of gait 11/22/2014  . Arthritis   . Cyst of breast, right, benign solitary   . Dyslipidemia   . H/O: hysterectomy   . Heart murmur   . History of appendectomy   . History of lumbosacral spine surgery   . Hypertension   . Inguinal hernia   . Lumbar stenosis   . Restless leg syndrome    Review of Systems:   Review of Systems  Constitutional: Positive for malaise/fatigue. Negative for chills, fever and weight loss.  HENT: Negative for congestion, ear pain, sinus pain and sore throat.   Respiratory: Positive for shortness of breath. Negative for sputum production.   Cardiovascular: Negative for chest pain, orthopnea and leg swelling.  Gastrointestinal: Positive for diarrhea. Negative for abdominal pain, nausea and vomiting.  Neurological: Positive for dizziness. Negative for tingling and headaches.     Physical Exam:  Constitution: NAD, appears stated age HENT: [place]/AT  Cardio: S3 present, RRR, no JVD Respiratory: decreased breath sounds, CTA Abdominal: +BS, NTTP  MSK: moving all extremities, no pitting edema  Neuro: a&o, cooperative  Skin: c/d/i    Vitals:   04/28/18 1005  BP: 140/70  Pulse: 84  Temp: (!) 97.5 F (36.4 C)  TempSrc: Oral  Weight: 139 lb 1.6 oz (63.1 kg)    Assessment & Plan:   See Encounters Tab for problem based charting.  Patient discussed with Dr. Angelia Mould

## 2018-05-03 NOTE — Progress Notes (Signed)
Internal Medicine Clinic Attending  Case discussed with Dr. Seawell at the time of the visit.  We reviewed the resident's history and exam and pertinent patient test results.  I agree with the assessment, diagnosis, and plan of care documented in the resident's note.    

## 2018-05-05 ENCOUNTER — Other Ambulatory Visit: Payer: Self-pay

## 2018-05-05 ENCOUNTER — Encounter: Payer: Medicare Other | Admitting: Internal Medicine

## 2018-05-05 ENCOUNTER — Ambulatory Visit (HOSPITAL_COMMUNITY)
Admission: RE | Admit: 2018-05-05 | Discharge: 2018-05-05 | Disposition: A | Discharge status: Home / Self Care | Payer: Medicare Other | Source: Ambulatory Visit | Attending: Internal Medicine | Admitting: Internal Medicine

## 2018-05-05 DIAGNOSIS — R0602 Shortness of breath: Secondary | ICD-10-CM | POA: Insufficient documentation

## 2018-05-05 DIAGNOSIS — I351 Nonrheumatic aortic (valve) insufficiency: Secondary | ICD-10-CM | POA: Diagnosis not present

## 2018-05-05 DIAGNOSIS — E785 Hyperlipidemia, unspecified: Secondary | ICD-10-CM | POA: Diagnosis not present

## 2018-05-05 DIAGNOSIS — I509 Heart failure, unspecified: Secondary | ICD-10-CM | POA: Diagnosis not present

## 2018-05-05 DIAGNOSIS — I700 Atherosclerosis of aorta: Secondary | ICD-10-CM | POA: Insufficient documentation

## 2018-05-05 DIAGNOSIS — I110 Hypertensive heart disease with heart failure: Secondary | ICD-10-CM | POA: Insufficient documentation

## 2018-05-05 NOTE — Progress Notes (Signed)
  Echocardiogram 2D Echocardiogram has been performed.  Darlina Sicilian M 05/05/2018, 3:34 PM

## 2018-05-05 NOTE — Progress Notes (Signed)
This encounter was created in error - please disregard.

## 2018-05-06 NOTE — Progress Notes (Signed)
Called and left voicemail on patient's phone to discuss results. If she returns call, would like her to be scheduled for follow-up serology and to speak with her.

## 2018-05-07 ENCOUNTER — Other Ambulatory Visit: Payer: Self-pay | Admitting: Internal Medicine

## 2018-05-07 DIAGNOSIS — J849 Interstitial pulmonary disease, unspecified: Secondary | ICD-10-CM

## 2018-05-10 ENCOUNTER — Other Ambulatory Visit: Payer: Self-pay | Admitting: Internal Medicine

## 2018-05-13 ENCOUNTER — Other Ambulatory Visit (HOSPITAL_COMMUNITY): Payer: Medicare Other

## 2018-06-08 ENCOUNTER — Other Ambulatory Visit: Payer: Self-pay | Admitting: Internal Medicine

## 2018-06-09 ENCOUNTER — Encounter: Payer: Medicare Other | Admitting: Internal Medicine

## 2018-06-10 ENCOUNTER — Other Ambulatory Visit: Payer: Self-pay

## 2018-06-10 ENCOUNTER — Ambulatory Visit (INDEPENDENT_AMBULATORY_CARE_PROVIDER_SITE_OTHER): Payer: Medicare Other | Admitting: Internal Medicine

## 2018-06-10 DIAGNOSIS — Z9109 Other allergy status, other than to drugs and biological substances: Secondary | ICD-10-CM | POA: Insufficient documentation

## 2018-06-10 DIAGNOSIS — I10 Essential (primary) hypertension: Secondary | ICD-10-CM

## 2018-06-10 NOTE — Assessment & Plan Note (Signed)
Patient states that she has been logging her BP's at home and they are usually in the 060-156 systolic. She saw her cardiologist yesterday who increased her HCTZ to 50 mg daily. She states during that visit her systolic BP was in the 153P. She continues to feel fatigued but relates this to her allergies. She has been compliant with amlodipine 10 mg, losartan 50 mg, metoprolol 25 mg, spironolactone 25 mg and Imdur 30 mg.  Plan: - Continue amlodipine 10 mg, losartan 50 mg, metoprolol 25 mg, spironolactone 25 mg and Imdur 30 mg and HCTZ 50 mg  - Patient will keep a log of BP's at home  - Will need to check BMP and K at some point; at this point risks outweigh the benefit of coming for lab draws

## 2018-06-10 NOTE — Progress Notes (Signed)
  [facility] Internal Medicine Residency Telephone Encounter Continuity Care Appointment  HPI:   This telephone encounter was created for [name] on 06/10/2018 for the following purpose/cc allergies and HTN.   Past Medical History:  Past Medical History:  Diagnosis Date  . Abnormality of gait 11/22/2014  . Arthritis   . Cyst of breast, right, benign solitary   . Dyslipidemia   . H/O: hysterectomy   . Heart murmur   . History of appendectomy   . History of lumbosacral spine surgery   . Hypertension   . Inguinal hernia   . Lumbar stenosis   . Restless leg syndrome       ROS:  Review of Systems  Constitutional: Positive for malaise/fatigue. Negative for chills, fever and weight loss.  HENT: Negative for congestion, sinus pain and sore throat.   Eyes: Negative for redness.  Respiratory: Positive for cough. Negative for sputum production, shortness of breath and wheezing.   Cardiovascular: Negative for chest pain.  Endo/Heme/Allergies: Positive for environmental allergies.     Assessment / Plan / Recommendations:   Please see A&P under problem oriented charting for assessment of the patient's acute and chronic medical conditions.   As always, pt is advised that if symptoms worsen or new symptoms arise, they should go to an urgent care facility or to to ER for further evaluation.   Consent and Medical Decision Making:   Patient discussed with Dr. Beryle Beams  This is a telephone encounter between Kristi Alexander and Mike Craze on 06/10/2018 for HTN and allergies. The visit was conducted with the patient located at home and [place] at [facility]. The patient's identity was confirmed using their DOB and current address. The patient has consented to being evaluated through a telephone encounter and understands the associated risks (an examination cannot be done and the patient may need to come in for an appointment) / benefits (allows the patient to remain at  home, decreasing exposure to coronavirus). I personally spent 13 minutes on medical discussion.

## 2018-06-10 NOTE — Progress Notes (Signed)
Medicine attending: Medical history, presenting problems, , and medications, reviewed with resident physician Dr Harlow Ohms on the day of the patient telephone consultation and I concur with her evaluation and management plan. Flare of seasonal allergies. No infectious contacts. I agree w flonase.

## 2018-06-10 NOTE — Assessment & Plan Note (Signed)
Patient states for the past few weeks she has been having a dry cough and feeling more fatigued secondary to environmental allergies. She has a known allergy to pollen. She attributes her tiredness to her dry cough which is mostly at night and keeps her awake. She denies fever, chills, shortness of breath, congestion or sore throat. She just bought Flonase but has not used it. Discussed using this nasal spray twice daily to see if her symptoms improve. Zyrtec was listed on her home medications but she has not been taking this. Recommended she can resume this medication as well if Flonase does not help her symptoms.   Plan: - Flonase and zyrtec

## 2018-06-23 ENCOUNTER — Encounter (HOSPITAL_COMMUNITY): Payer: Self-pay | Admitting: Internal Medicine

## 2018-06-23 ENCOUNTER — Inpatient Hospital Stay (HOSPITAL_COMMUNITY)
Admission: EM | Admit: 2018-06-23 | Discharge: 2018-06-26 | DRG: 291 | Disposition: A | Discharge status: Home / Self Care | Payer: Medicare Other | Attending: Internal Medicine | Admitting: Internal Medicine

## 2018-06-23 ENCOUNTER — Emergency Department (HOSPITAL_COMMUNITY): Payer: Medicare Other

## 2018-06-23 ENCOUNTER — Observation Stay (HOSPITAL_COMMUNITY): Payer: Medicare Other

## 2018-06-23 ENCOUNTER — Other Ambulatory Visit: Payer: Self-pay

## 2018-06-23 DIAGNOSIS — G2581 Restless legs syndrome: Secondary | ICD-10-CM | POA: Diagnosis present

## 2018-06-23 DIAGNOSIS — Z8249 Family history of ischemic heart disease and other diseases of the circulatory system: Secondary | ICD-10-CM

## 2018-06-23 DIAGNOSIS — J849 Interstitial pulmonary disease, unspecified: Secondary | ICD-10-CM | POA: Diagnosis present

## 2018-06-23 DIAGNOSIS — J302 Other seasonal allergic rhinitis: Secondary | ICD-10-CM | POA: Diagnosis present

## 2018-06-23 DIAGNOSIS — I2510 Atherosclerotic heart disease of native coronary artery without angina pectoris: Secondary | ICD-10-CM | POA: Diagnosis present

## 2018-06-23 DIAGNOSIS — I130 Hypertensive heart and chronic kidney disease with heart failure and stage 1 through stage 4 chronic kidney disease, or unspecified chronic kidney disease: Secondary | ICD-10-CM | POA: Diagnosis not present

## 2018-06-23 DIAGNOSIS — D519 Vitamin B12 deficiency anemia, unspecified: Secondary | ICD-10-CM | POA: Diagnosis present

## 2018-06-23 DIAGNOSIS — I255 Ischemic cardiomyopathy: Secondary | ICD-10-CM | POA: Diagnosis present

## 2018-06-23 DIAGNOSIS — Z9049 Acquired absence of other specified parts of digestive tract: Secondary | ICD-10-CM

## 2018-06-23 DIAGNOSIS — Z800 Family history of malignant neoplasm of digestive organs: Secondary | ICD-10-CM

## 2018-06-23 DIAGNOSIS — Z801 Family history of malignant neoplasm of trachea, bronchus and lung: Secondary | ICD-10-CM

## 2018-06-23 DIAGNOSIS — M1990 Unspecified osteoarthritis, unspecified site: Secondary | ICD-10-CM | POA: Diagnosis present

## 2018-06-23 DIAGNOSIS — I10 Essential (primary) hypertension: Secondary | ICD-10-CM | POA: Diagnosis present

## 2018-06-23 DIAGNOSIS — I509 Heart failure, unspecified: Secondary | ICD-10-CM | POA: Diagnosis not present

## 2018-06-23 DIAGNOSIS — Z79899 Other long term (current) drug therapy: Secondary | ICD-10-CM

## 2018-06-23 DIAGNOSIS — Z7951 Long term (current) use of inhaled steroids: Secondary | ICD-10-CM

## 2018-06-23 DIAGNOSIS — E785 Hyperlipidemia, unspecified: Secondary | ICD-10-CM | POA: Diagnosis present

## 2018-06-23 DIAGNOSIS — Z90710 Acquired absence of both cervix and uterus: Secondary | ICD-10-CM

## 2018-06-23 DIAGNOSIS — I5023 Acute on chronic systolic (congestive) heart failure: Secondary | ICD-10-CM | POA: Diagnosis present

## 2018-06-23 DIAGNOSIS — R7303 Prediabetes: Secondary | ICD-10-CM | POA: Diagnosis present

## 2018-06-23 DIAGNOSIS — M48061 Spinal stenosis, lumbar region without neurogenic claudication: Secondary | ICD-10-CM | POA: Diagnosis present

## 2018-06-23 DIAGNOSIS — N182 Chronic kidney disease, stage 2 (mild): Secondary | ICD-10-CM | POA: Diagnosis present

## 2018-06-23 DIAGNOSIS — D638 Anemia in other chronic diseases classified elsewhere: Secondary | ICD-10-CM | POA: Diagnosis present

## 2018-06-23 DIAGNOSIS — Z833 Family history of diabetes mellitus: Secondary | ICD-10-CM

## 2018-06-23 DIAGNOSIS — N179 Acute kidney failure, unspecified: Secondary | ICD-10-CM | POA: Diagnosis present

## 2018-06-23 DIAGNOSIS — Z888 Allergy status to other drugs, medicaments and biological substances status: Secondary | ICD-10-CM

## 2018-06-23 DIAGNOSIS — E559 Vitamin D deficiency, unspecified: Secondary | ICD-10-CM | POA: Diagnosis present

## 2018-06-23 DIAGNOSIS — I5082 Biventricular heart failure: Secondary | ICD-10-CM | POA: Diagnosis present

## 2018-06-23 DIAGNOSIS — I739 Peripheral vascular disease, unspecified: Secondary | ICD-10-CM | POA: Diagnosis present

## 2018-06-23 DIAGNOSIS — Z87891 Personal history of nicotine dependence: Secondary | ICD-10-CM

## 2018-06-23 DIAGNOSIS — Z880 Allergy status to penicillin: Secondary | ICD-10-CM

## 2018-06-23 DIAGNOSIS — I272 Pulmonary hypertension, unspecified: Secondary | ICD-10-CM | POA: Diagnosis present

## 2018-06-23 HISTORY — DX: Heart failure, unspecified: I50.9

## 2018-06-23 LAB — CBC WITH DIFFERENTIAL/PLATELET
Abs Immature Granulocytes: 0.01 10*3/uL (ref 0.00–0.07)
Basophils Absolute: 0 10*3/uL (ref 0.0–0.1)
Basophils Relative: 1 %
Eosinophils Absolute: 0.2 10*3/uL (ref 0.0–0.5)
Eosinophils Relative: 3 %
HCT: 38.8 % (ref 36.0–46.0)
Hemoglobin: 12.1 g/dL (ref 12.0–15.0)
Immature Granulocytes: 0 %
Lymphocytes Relative: 17 %
Lymphs Abs: 1 10*3/uL (ref 0.7–4.0)
MCH: 28.9 pg (ref 26.0–34.0)
MCHC: 31.2 g/dL (ref 30.0–36.0)
MCV: 92.6 fL (ref 80.0–100.0)
Monocytes Absolute: 0.7 10*3/uL (ref 0.1–1.0)
Monocytes Relative: 11 %
Neutro Abs: 4 10*3/uL (ref 1.7–7.7)
Neutrophils Relative %: 68 %
Platelets: 232 10*3/uL (ref 150–400)
RBC: 4.19 MIL/uL (ref 3.87–5.11)
RDW: 17.9 % — ABNORMAL HIGH (ref 11.5–15.5)
WBC: 5.9 10*3/uL (ref 4.0–10.5)
nRBC: 0 % (ref 0.0–0.2)

## 2018-06-23 LAB — BRAIN NATRIURETIC PEPTIDE: B Natriuretic Peptide: >4500 pg/mL — ABNORMAL HIGH (ref 0.0–100.0)

## 2018-06-23 LAB — CREATININE, SERUM
Creatinine, Ser: 1.24 mg/dL — ABNORMAL HIGH (ref 0.44–1.00)
GFR calc Af Amer: 44 mL/min — ABNORMAL LOW (ref >60)
GFR calc non Af Amer: 38 mL/min — ABNORMAL LOW (ref >60)

## 2018-06-23 LAB — CBC
HCT: 38.8 % (ref 36.0–46.0)
Hemoglobin: 12.3 g/dL (ref 12.0–15.0)
MCH: 28.7 pg (ref 26.0–34.0)
MCHC: 31.7 g/dL (ref 30.0–36.0)
MCV: 90.7 fL (ref 80.0–100.0)
Platelets: 220 10*3/uL (ref 150–400)
RBC: 4.28 MIL/uL (ref 3.87–5.11)
RDW: 17.6 % — ABNORMAL HIGH (ref 11.5–15.5)
WBC: 5.8 10*3/uL (ref 4.0–10.5)
nRBC: 0 % (ref 0.0–0.2)

## 2018-06-23 LAB — BASIC METABOLIC PANEL
Anion gap: 12 (ref 5–15)
BUN: 24 mg/dL — ABNORMAL HIGH (ref 8–23)
CO2: 22 mmol/L (ref 22–32)
Calcium: 9.8 mg/dL (ref 8.9–10.3)
Chloride: 110 mmol/L (ref 98–111)
Creatinine, Ser: 1.24 mg/dL — ABNORMAL HIGH (ref 0.44–1.00)
GFR calc Af Amer: 44 mL/min — ABNORMAL LOW (ref >60)
GFR calc non Af Amer: 38 mL/min — ABNORMAL LOW (ref >60)
Glucose, Bld: 107 mg/dL — ABNORMAL HIGH (ref 70–99)
Potassium: 4.3 mmol/L (ref 3.5–5.1)
Sodium: 144 mmol/L (ref 135–145)

## 2018-06-23 LAB — TROPONIN I
Troponin I: <0.03 ng/mL (ref <0.03)
Troponin I: <0.03 ng/mL (ref <0.03)

## 2018-06-23 MED ORDER — ACETAMINOPHEN 325 MG PO TABS: 650.0000 mg | ORAL_TABLET | ORAL | Status: DC | PRN

## 2018-06-23 MED ORDER — LORATADINE 10 MG PO TABS
10.0000 mg | ORAL_TABLET | Freq: Every day | ORAL | Status: DC
  Administered 2018-06-23 – 2018-06-25 (×3): 10 mg via ORAL
  Filled 2018-06-23 (×3): qty 1

## 2018-06-23 MED ORDER — SODIUM CHLORIDE 0.9% FLUSH
3.0000 mL | Freq: Two times a day (BID) | INTRAVENOUS | Status: DC
  Administered 2018-06-23 – 2018-06-26 (×6): 3 mL via INTRAVENOUS

## 2018-06-23 MED ORDER — ACETAMINOPHEN 500 MG PO TABS: 500.0000 mg | ORAL_TABLET | Freq: Four times a day (QID) | ORAL | Status: DC | PRN

## 2018-06-23 MED ORDER — ONDANSETRON HCL 4 MG/2ML IJ SOLN
4.0000 mg | Freq: Four times a day (QID) | INTRAMUSCULAR | Status: DC | PRN
  Filled 2018-06-23: qty 2

## 2018-06-23 MED ORDER — METOPROLOL SUCCINATE ER 25 MG PO TB24
25.0000 mg | ORAL_TABLET | Freq: Every day | ORAL | Status: DC
  Administered 2018-06-24: 25 mg via ORAL
  Filled 2018-06-23 (×2): qty 1

## 2018-06-23 MED ORDER — SODIUM CHLORIDE 0.9 % IV SOLN: 250.0000 mL | INTRAVENOUS | Status: DC | PRN

## 2018-06-23 MED ORDER — ATORVASTATIN CALCIUM 10 MG PO TABS
20.0000 mg | ORAL_TABLET | Freq: Every day | ORAL | Status: DC
  Administered 2018-06-23 – 2018-06-25 (×3): 20 mg via ORAL
  Filled 2018-06-23 (×3): qty 2

## 2018-06-23 MED ORDER — SODIUM CHLORIDE 0.9% FLUSH: 3.0000 mL | INTRAVENOUS | Status: DC | PRN

## 2018-06-23 MED ORDER — FLUTICASONE PROPIONATE 50 MCG/ACT NA SUSP
1.0000 | Freq: Every day | NASAL | Status: DC | PRN
  Filled 2018-06-23: qty 16

## 2018-06-23 MED ORDER — ENOXAPARIN SODIUM 40 MG/0.4ML ~~LOC~~ SOLN
40.0000 mg | SUBCUTANEOUS | Status: DC
  Administered 2018-06-23: 40 mg via SUBCUTANEOUS
  Filled 2018-06-23: qty 0.4

## 2018-06-23 MED ORDER — ISOSORBIDE MONONITRATE ER 30 MG PO TB24
15.0000 mg | ORAL_TABLET | Freq: Every day | ORAL | Status: DC
  Administered 2018-06-24: 15 mg via ORAL
  Filled 2018-06-23 (×2): qty 1

## 2018-06-23 MED ORDER — FUROSEMIDE 10 MG/ML IJ SOLN
40.0000 mg | Freq: Every day | INTRAMUSCULAR | Status: DC
  Administered 2018-06-23: 15:00:00 40 mg via INTRAVENOUS
  Filled 2018-06-23: qty 4

## 2018-06-23 MED ORDER — AMLODIPINE BESYLATE 5 MG PO TABS
10.0000 mg | ORAL_TABLET | Freq: Every day | ORAL | Status: DC
  Administered 2018-06-24: 10 mg via ORAL
  Filled 2018-06-23 (×2): qty 4

## 2018-06-23 MED ORDER — SPIRONOLACTONE 25 MG PO TABS
25.0000 mg | ORAL_TABLET | Freq: Every day | ORAL | Status: DC
  Administered 2018-06-23 – 2018-06-26 (×4): 25 mg via ORAL
  Filled 2018-06-23 (×4): qty 1

## 2018-06-23 NOTE — H&P (Addendum)
Date: 06/23/2018               Patient Name:  Kristi Alexander MRN: [id number]  DOB: 1925-06-26 Age / Sex: 85 y.o., female   PCP: Mike Craze, DO         Medical Service: Internal Medicine Teaching Service         Attending Physician: Dr. Oda Kilts, MD    First Contact: Dr. Laural Golden Pager: [phone]  Second Contact: Dr. Reesa Chew Pager: [phone]       After Hours (After 5p/  First Contact Pager: [phone]  weekends / holidays): Second Contact Pager: [phone]   Chief Complaint: Cough  History of Present Illness: Kristi Alexander is a 85 year old female with HTN, HLD, interstitial lung disease, chronic diastolic heart failure and seasonal allergies presenting to the ED with worsening cough and fatigue. She recently had a tele-visit with her PCP she complained of runny nose, watery eyes, congestion and productive cough.  She was instructed to continue allergy medications and Flonase.  She states that her allergy symptoms have mostly improved however she still continued to have a productive cough with clear phlegm.  Denies any fever, chills, chest pain, palpitations, headache.  She states she is just continued to feel more fatigued and weak.  She states at night she does not get much sleep and feels restless and agitated.  She states she has been taking her medications as prescribed.  States Dr. Terrence Dupont a recently increased spironolactone from 25 to 50 mg and started on hydrochlorothiazide 25 mg.  He states since these medication changes she has been urinating more frequently.  Denies any changes to her diet and has in fact had a decreased appetite for the past few months.  States she feels abdominal fullness and on and off notices swelling in her lower extremities.  ED course: On arrival, patient was hypertensive. Cr 1.24. BNP >4500. Chest xray showed stable cardiomegaly with pulmonary vascular congestion, generalized fibrosis, and no evident consolidation.   Meds:  No current  facility-administered medications on file prior to encounter.    Current Outpatient Medications on File Prior to Encounter  Medication Sig Dispense Refill  . acetaminophen (TYLENOL) 500 MG tablet Take 500 mg every 6 (six) hours as needed by mouth (pain).    Marland Kitchen amLODipine (NORVASC) 10 MG tablet TAKE 1 TABLET BY MOUTH ONCE DAILY 90 tablet 1  . atorvastatin (LIPITOR) 20 MG tablet     . b complex vitamins tablet Take 1 tablet by mouth daily.    . Calcium Carb-Cholecalciferol (CALCIUM-VITAMIN D) 600-400 MG-UNIT TABS Take 1 tablet by mouth once daily 60 tablet 0  . Calcium Carbonate-Vitamin D 600-400 MG-UNIT tablet Take 1 tablet by mouth daily.    . cetirizine (ZYRTEC) 10 MG tablet Take 10 mg daily as needed by mouth for allergies.    Marland Kitchen diclofenac sodium (VOLTAREN) 1 % GEL Apply 2 g topically 4 (four) times daily. 1 Tube 2  . ferrous sulfate 325 (65 FE) MG tablet Take 325 mg daily with breakfast by mouth.    . fluticasone (FLONASE) 50 MCG/ACT nasal spray Place 1 spray daily as needed into both nostrils for allergies or rhinitis.    . hydrochlorothiazide (HYDRODIURIL) 25 MG tablet Take 1 tablet (25 mg total) by mouth daily. 30 tablet 2  . hydrOXYzine (ATARAX/VISTARIL) 25 MG tablet TAKE 1 TABLET BY MOUTH ONCE DAILY AS NEEDED 90 tablet 2  . isosorbide mononitrate (IMDUR) 30 MG 24  hr tablet TAKE 1 & 1/2 (ONE & ONE-HALF) TABLETS BY MOUTH ONCE DAILY 135 tablet 3  . losartan (COZAAR) 50 MG tablet     . metoprolol succinate (TOPROL-XL) 25 MG 24 hr tablet Take 1 tablet by mouth once daily 90 tablet 0  . psyllium (METAMUCIL) 0.52 g capsule Take 2 capsules (1.04 g total) daily by mouth. 60 capsule 2  . spironolactone (ALDACTONE) 50 MG tablet Take 0.5 tablets (25 mg total) by mouth daily. 30 tablet 2  . terbinafine (LAMISIL) 250 MG tablet Take 250 mg by mouth daily.  0  . Vitamin D, Ergocalciferol, (DRISDOL) 50000 units CAPS capsule Take 1 capsule (50,000 Units total) by mouth every 7 (seven) days. 30 capsule 3    No outpatient medications have been marked as taking for the 06/23/18 encounter [facility] Encounter).     Allergies: Allergies as of 06/23/2018 - Review Complete 06/23/2018  Allergen Reaction Noted  . Penicillins Swelling 07/13/2012  . Pravastatin Other (See Comments) 11/03/2016   Past Medical History:  Diagnosis Date  . Abnormality of gait 11/22/2014  . Acute exacerbation of CHF (congestive heart failure) ([place]) 06/23/2018  . Arthritis   . Cyst of breast, right, benign solitary   . Dyslipidemia   . H/O: hysterectomy   . Heart murmur   . History of appendectomy   . History of lumbosacral spine surgery   . Hypertension   . Inguinal hernia   . Lumbar stenosis   . Restless leg syndrome     Family History:  Family History  Problem Relation Age of Onset  . Heart disease Mother   . Diabetes Mother   . Heart attack Father   . Lung cancer Brother   . Healthy Sister   . Stomach cancer Son     Social History:  Social History   Socioeconomic History  . Marital status: Married    Spouse name: [name]   . Number of children: 2  . Years of education: 12+  . Highest education level: Not on file  Occupational History  . Occupation: retired  Scientific laboratory technician  . Financial resource strain: Not on file  . Food insecurity:    Worry: Not on file    Inability: Not on file  . Transportation needs:    Medical: Not on file    Non-medical: Not on file  Tobacco Use  . Smoking status: Former Smoker    Packs/day: 0.00    Types: Cigarettes    Last attempt to quit: 02/25/2003    Years since quitting: 15.3  . Smokeless tobacco: Never Used  Substance and Sexual Activity  . Alcohol use: Yes    Comment: About 2 drinks per night  . Drug use: No  . Sexual activity: Not on file  Lifestyle  . Physical activity:    Days per week: Not on file    Minutes per session: Not on file  . Stress: Not on file  Relationships  . Social connections:    Talks on phone: Not on file    Gets together:  Not on file    Attends religious service: Not on file    Active member of club or organization: Not on file    Attends meetings of clubs or organizations: Not on file    Relationship status: Not on file  . Intimate partner violence:    Fear of current or ex partner: Not on file    Emotionally abused: Not on file    Physically abused: Not  on file    Forced sexual activity: Not on file  Other Topics Concern  . Not on file  Social History Narrative   Patient is widowed and lives with her daugther. The patient is retired. Patient has 2 children and has a college education.    Patient is right handed.   Patient drinks very little caffeine.   Review of Systems: A complete ROS was negative except as per HPI.   Physical Exam: Blood pressure (!) 159/67, pulse 78, temperature (!) 97.5 F (36.4 C), temperature source Oral, resp. rate 16, SpO2 97 %.  Physical Exam  Constitutional: She is oriented to person, place, and time and well-developed, well-nourished, and in no distress. No distress.  Neck: JVD present.  Cardiovascular: Normal rate, regular rhythm and intact distal pulses.  Murmur heard. Pulmonary/Chest: Effort normal and breath sounds normal. No respiratory distress. She has no wheezes. She has no rales.  Abdominal: Soft. Bowel sounds are normal. She exhibits no distension. There is no abdominal tenderness.  Musculoskeletal:        General: No tenderness or edema.  Neurological: She is alert and oriented to person, place, and time.  Skin: Skin is warm and dry. She is not diaphoretic.  Psychiatric: Mood, memory, affect and judgment normal.    EKG: personally reviewed my interpretation is anterolateral T wave inversions  CXR: personally reviewed my interpretation is stable cardiomegaly with pulmonary vascular congestion  Assessment & Plan by Problem: Active Problems:   Acute exacerbation of CHF (congestive heart failure) [facility])  [name] is a 85 year old female with  HTN, HLD, interstitial lung disease, chronic diastolic heart failure and seasonal allergies presenting to the ED with worsening cough and fatigue.  Chest x-ray showed pulmonary vascular congestion and BNP was elevated greater than 4500.  Acute on Chronic Diastolic Heart Failure: Patient presented with worsening cough and fatigue. BNP >4500 and chest xray showed pulmonary vascular congestion. On exam, no LE edema, + JVD. Last echo was 05/05/2018 which showed a LV EF of 50-55%, moderate hypertrophy of basal septum, LV impaired relaxation, elevated LV end diastolic pressure. Unclear trigger to exacerbation. Due to EKG changes that show inferolateral T wave inversions will trend troponin and evaluate for possible ischemic event. Patient denies any chest pain, palpitations, fevers or chills. - C/w imdur 30 mg, metoprolol 25 mg and spironolactone 50 mg, hold HCTZ - IV furosemide 40 mg  - Cardiac monitoring  - Trend troponins - Repeat EKG  - Echo - Daily weights  - Stricts I&Os  AKI: Cr 1.24 on admission, baseline below 1.0. Likely 2/2 to CHF exacerbation.  - Avoid nephrotoxic agents - Trend BMP  HTN: Home medications include amlodipine 10mg , HCTZ 25 mg, imdur 30 mg, losartan 50 mg, metoprolol 25 mg and spironolactone 50 mg. HCTZ was recently started and spironolactone was recently increased from 25 mg to 50 mg.  - C/w home medications - Hold losartan due to AKI - Hold HCTZ and will give IV furosemide for now   HLD: C/w atorvastatin 20 mg   Diet: Heart healthy  VTE ppx: Lovenox Full Code Dispo: Admit patient to Observation with expected length of stay less than 2 midnights.  Signed: Mike Craze, DO 06/23/2018, 2:21 PM

## 2018-06-23 NOTE — ED Notes (Signed)
Admitting provider at bedside for evaluation.

## 2018-06-23 NOTE — ED Triage Notes (Addendum)
Patient came in to see her internal medicine doctor d/t worsening "allergy symptoms," including cough that is now productive (clear mucus) as well as generalized weakness, watery eyes, congestion, runny nose. Resp e/u, skin w/d. Denies fevers/chills, chest pain, shortness of breath. Appointment not until 5/4, but was told to return if symptoms became worse.

## 2018-06-23 NOTE — ED Notes (Signed)
Pt ambulated with a heart rate of 108 and O2 at 100%

## 2018-06-23 NOTE — ED Provider Notes (Signed)
[place] EMERGENCY DEPARTMENT Provider Note   CSN: [id number] Arrival date & time: 06/23/18  6599    History   Chief Complaint Chief Complaint  Patient presents with  . Cough    HPI Kristi Alexander is a 85 y.o. female with past medical history of hypertension, CHF, interstitial lung disease, presenting to the emergency department with complaint of worsening cough and fatigue.  Patient states couple of weeks ago she was having trouble with her seasonal allergies.  She had a tele-visit with her PCP, who instructed her to take allergy medications and use Flonase.  She states her runny nose and itchy watery eyes have significantly improved, however her cough is persistent and somewhat worse.  She states she has a clear sputum production.  She feels fatigued and generally weak.  She feels winded with ambulation and simple tasks like getting dressed.  Patient recently had imaging of her chest in March for symptoms that were concerning for heart failure.  She had CT of her chest and an echocardiogram done.  The echo revealed EF of 50 to 35% with diastolic dysfunction.  CT chest was consistent with moderate interstitial lung disease.  She states her cardiologist is Dr. Terrence Dupont, recently increased her spironolactone dose from 25 to 50 mg, and added on HCTZ for better control of her blood pressure.  She states she has been urinating more frequently and sometimes leaking urine now.  Denies fever, chills, sick contacts.  She states she has been self isolating in the setting of the current COVID-19 pandemic.  She states she only went out to attend her cardiologist appointment last week.     The history is provided by the patient and medical records.    Past Medical History:  Diagnosis Date  . Abnormality of gait 11/22/2014  . Acute exacerbation of CHF (congestive heart failure) ([place]) 06/23/2018  . Arthritis   . Cyst of breast, right, benign solitary   . Dyslipidemia   . H/O:  hysterectomy   . Heart murmur   . History of appendectomy   . History of lumbosacral spine surgery   . Hypertension   . Inguinal hernia   . Lumbar stenosis   . Restless leg syndrome     Patient Active Problem List   Diagnosis Date Noted  . Acute exacerbation of CHF (congestive heart failure) ([place]) 06/23/2018  . Environmental allergies 06/10/2018  . Interstitial lung disease ([place]) 04/28/2018  . Takes iron supplements 03/10/2018  . Impingement syndrome of left shoulder 11/10/2017  . History of lumbar laminectomy for spinal cord decompression 08/06/2017  . GI bleed 01/10/2017  . Atherosclerosis of native artery of both lower extremities with intermittent claudication ([place]) 08/08/2016  . Peripheral arterial disease ([place]) 08/04/2016  . Vitamin D deficiency 11/28/2015  . Osteopenia 09/16/2015  . Pain in joint, shoulder region 09/16/2015  . Spinal stenosis of lumbar region 01/24/2015  . Preventative health care 12/11/2014  . Essential hypertension 12/11/2014  . Pulmonary hypertension ([place])   . Chronic cough   . Chronic diastolic heart failure ([place]) 05/15/2013  . Pre-diabetes 05/14/2013  . Other vitamin B12 deficiency anemia 07/13/2012  . Restless legs syndrome (RLS) 07/13/2012    Past Surgical History:  Procedure Laterality Date  . ABDOMINAL HYSTERECTOMY    . APPENDECTOMY    . BOWEL RESECTION N/A 03/23/2013   Procedure: SMALL BOWEL RESECTION;  Surgeon: Gwenyth Ober, MD;  Location: [place];  Service: General;  Laterality: N/A;  . BREAST CYST EXCISION  LEFT  . ESOPHAGOGASTRODUODENOSCOPY N/A 04/01/2013   Procedure: ESOPHAGOGASTRODUODENOSCOPY (EGD);  Surgeon: Gwenyth Ober, MD;  Location: [place];  Service: General;  Laterality: N/A;  . HERNIA REPAIR    . LAPAROTOMY N/A 03/23/2013   Procedure: EXPLORATORY LAPAROTOMY;  Surgeon: Gwenyth Ober, MD;  Location: [place];  Service: General;  Laterality: N/A;  . LUMBAR LAMINECTOMY/DECOMPRESSION MICRODISCECTOMY N/A 11/10/2016    Procedure: L4-5 DECOMPRESSION FOR RECURRENT STENOSIS;  Surgeon: Marybelle Killings, MD;  Location: [place];  Service: Orthopedics;  Laterality: N/A;  . PEG PLACEMENT N/A 04/01/2013   Procedure: PERCUTANEOUS ENDOSCOPIC GASTROSTOMY (PEG) PLACEMENT;  Surgeon: Gwenyth Ober, MD;  Location: [facility];  Service: General;  Laterality: N/A;  . TRACHEOSTOMY     fe[facility]     OB History   No obstetric history on file.      Home Medications    Prior to Admission medications   Medication Sig Start Date End Date Taking? Authorizing Provider  acetaminophen (TYLENOL) 500 MG tablet Take 500 mg every 6 (six) hours as needed by mouth (pain).    [provider]  amLODipine (NORVASC) 10 MG tablet TAKE 1 TABLET BY MOUTH ONCE DAILY 08/24/17   Annia Belt, MD  atorvastatin (LIPITOR) 20 MG tablet  04/08/18   [provider]  b complex vitamins tablet Take 1 tablet by mouth daily.    [provider]  Calcium Carb-Cholecalciferol (CALCIUM-VITAMIN D) 600-400 MG-UNIT TABS Take 1 tablet by mouth once daily 06/08/18   Rehman, Areeg N, DO  Calcium Carbonate-Vitamin D 600-400 MG-UNIT tablet Take 1 tablet by mouth daily. 03/31/18   [provider]  cetirizine (ZYRTEC) 10 MG tablet Take 10 mg daily as needed by mouth for allergies.    [provider]  diclofenac sodium (VOLTAREN) 1 % GEL Apply 2 g topically 4 (four) times daily. 04/15/17   Maryellen Pile, MD  ferrous sulfate 325 (65 FE) MG tablet Take 325 mg daily with breakfast by mouth.    [provider]  fluticasone (FLONASE) 50 MCG/ACT nasal spray Place 1 spray daily as needed into both nostrils for allergies or rhinitis.    [provider]  hydrochlorothiazide (HYDRODIURIL) 25 MG tablet Take 1 tablet (25 mg total) by mouth daily. 04/23/18   Velna Ochs, MD  hydrOXYzine (ATARAX/VISTARIL) 25 MG tablet TAKE 1 TABLET BY MOUTH ONCE DAILY AS NEEDED 09/18/17   Oda Kilts, MD  isosorbide mononitrate  (IMDUR) 30 MG 24 hr tablet TAKE 1 & 1/2 (ONE & ONE-HALF) TABLETS BY MOUTH ONCE DAILY 07/29/17   Maryellen Pile, MD  losartan (COZAAR) 50 MG tablet  03/14/17   [provider]  metoprolol succinate (TOPROL-XL) 25 MG 24 hr tablet Take 1 tablet by mouth once daily 05/13/18   Rehman, Areeg N, DO  psyllium (METAMUCIL) 0.52 g capsule Take 2 capsules (1.04 g total) daily by mouth. 01/12/17   Welford Roche, MD  spironolactone (ALDACTONE) 50 MG tablet Take 0.5 tablets (25 mg total) by mouth daily. 04/23/18   Velna Ochs, MD  terbinafine (LAMISIL) 250 MG tablet Take 250 mg by mouth daily. 10/09/17   [provider]  Vitamin D, Ergocalciferol, (DRISDOL) 50000 units CAPS capsule Take 1 capsule (50,000 Units total) by mouth every 7 (seven) days. 12/01/17   Rehman, Areeg N, DO    Family History Family History  Problem Relation Age of Onset  . Heart disease Mother   . Diabetes Mother   . Heart attack Father   .  Lung cancer Brother   . Healthy Sister   . Stomach cancer Son     Social History Social History   Tobacco Use  . Smoking status: Former Smoker    Packs/day: 0.00    Types: Cigarettes    Last attempt to quit: 02/25/2003    Years since quitting: 15.3  . Smokeless tobacco: Never Used  Substance Use Topics  . Alcohol use: Yes    Comment: About 2 drinks per night  . Drug use: No     Allergies   Penicillins and Pravastatin   Review of Systems Review of Systems  Constitutional: Positive for appetite change (Decreased) and fatigue. Negative for fever.  HENT: Negative for congestion and sore throat.   Respiratory: Positive for cough and shortness of breath.   Cardiovascular: Negative for chest pain, palpitations and leg swelling.  Gastrointestinal: Negative for abdominal pain and nausea.  All other systems reviewed and are negative.    Physical Exam Updated Vital Signs BP (!) 159/67   Pulse 78   Temp (!) 97.5 F (36.4 C) (Oral)   Resp 16   SpO2 97%    Physical Exam Vitals signs and nursing note reviewed.  Constitutional:      General: She is not in acute distress.    Appearance: She is well-developed. She is not ill-appearing.  HENT:     Head: Normocephalic and atraumatic.     Nose: Nose normal.     Mouth/Throat:     Mouth: Mucous membranes are moist.     Pharynx: Oropharynx is clear. No oropharyngeal exudate or posterior oropharyngeal erythema.  Eyes:     Conjunctiva/sclera: Conjunctivae normal.  Cardiovascular:     Rate and Rhythm: Normal rate and regular rhythm.  Pulmonary:     Effort: Pulmonary effort is normal. No respiratory distress.     Comments: Breath sounds diminished throughout.  Speaking in full sentences with normal effort. Abdominal:     General: Bowel sounds are normal. There is no distension.     Palpations: Abdomen is soft.     Tenderness: There is no abdominal tenderness. There is no guarding or rebound.  Musculoskeletal:     Comments: Trace pretibial edema bilaterally.  Skin:    General: Skin is warm.  Neurological:     Mental Status: She is alert.  Psychiatric:        Behavior: Behavior normal.      ED Treatments / Results  Labs (all labs ordered are listed, but only abnormal results are displayed) Labs Reviewed  BASIC METABOLIC PANEL - Abnormal; Notable for the following components:      Result Value   Glucose, Bld 107 (*)    BUN 24 (*)    Creatinine, Ser 1.24 (*)    GFR calc non Af Amer 38 (*)    GFR calc Af Amer 44 (*)    All other components within normal limits  CBC WITH DIFFERENTIAL/PLATELET - Abnormal; Notable for the following components:   RDW 17.9 (*)    All other components within normal limits  BRAIN NATRIURETIC PEPTIDE - Abnormal; Notable for the following components:   B Natriuretic Peptide >4,500.0 (*)    All other components within normal limits  TROPONIN I  TROPONIN I  TROPONIN I  CBC  CREATININE, SERUM    EKG EKG Interpretation  Date/Time:  Wednesday June 23 2018 10:32:56 EDT Ventricular Rate:  90 PR Interval:    QRS Duration: 88 QT Interval:  364 QTC Calculation: 446  R Axis:   64 Text Interpretation:  Sinus rhythm Probable left atrial enlargement Anteroseptal infarct, old T wave abnormality similar to 2011 study Abnormal ekg Reconfirmed by Carmin Muskrat [phone]) on 06/23/2018 11:30:57 AM   Radiology Dg Chest 2 View  Result Date: 06/23/2018 CLINICAL DATA:  Cough and congestion EXAM: CHEST - 2 VIEW COMPARISON:  Chest radiograph April 27, 2018 and chest CT May 05, 2018 FINDINGS: There is extensive fibrosis throughout the lungs bilaterally, essentially stable. Interstitium is diffusely prominent. No airspace consolidation. There is generalized cardiomegaly with slight pulmonary venous hypertension. No adenopathy. There is aortic atherosclerosis. There is arthropathy in each shoulder. IMPRESSION: Stable cardiomegaly with a degree of pulmonary vascular congestion. Generalized fibrosis. There may be mild chronic interstitial edema superimposed. No consolidation. There may well be a degree of chronic congestive heart failure superimposed on fibrosis. No new opacity evident. No consolidation. Aortic Atherosclerosis (ICD10-I70.0). Electronically Signed   By: Lowella Grip III M.D.   On: 06/23/2018 09:43    Procedures Procedures (including critical care time)  Medications Ordered in ED Medications  sodium chloride flush (NS) 0.9 % injection 3 mL (has no administration in time range)  sodium chloride flush (NS) 0.9 % injection 3 mL (has no administration in time range)  0.9 %  sodium chloride infusion (has no administration in time range)  acetaminophen (TYLENOL) tablet 650 mg (has no administration in time range)  ondansetron (ZOFRAN) injection 4 mg (has no administration in time range)  enoxaparin (LOVENOX) injection 40 mg (has no administration in time range)  furosemide (LASIX) injection 40 mg (has no administration in time range)  amLODipine  (NORVASC) tablet 10 mg (has no administration in time range)  atorvastatin (LIPITOR) tablet 20 mg (has no administration in time range)  loratadine (CLARITIN) tablet 10 mg (has no administration in time range)  fluticasone (FLONASE) 50 MCG/ACT nasal spray 1 spray (has no administration in time range)  isosorbide mononitrate (IMDUR) 24 hr tablet 15 mg (has no administration in time range)  metoprolol succinate (TOPROL-XL) 24 hr tablet 25 mg (has no administration in time range)  spironolactone (ALDACTONE) tablet 25 mg (has no administration in time range)     Initial Impression / Assessment and Plan / ED Course  I have reviewed the triage vital signs and the nursing notes.  Pertinent labs & imaging results that were available during my care of the patient were reviewed by me and considered in my medical decision making (see chart for details).  Clinical Course as of Jun 22 1398  Wed Jun 23, 2018  1258 IM team to come evaluate patient in the ED for plan.   [JR]    Clinical Course User Index [JR] Robinson, [place] N, PA-C      Past medical history of CHF and interstitial lung disease, presenting to the emergency department with worsening generalized weakness, cough, and shortness of breath on exertion.  On arrival, patient is afebrile slightly hypertensive, O2 saturation 99% on room air.  She has normal work of breathing and is a overall well-appearing.  Lung sounds slightly diminished bilaterally, however no wheezes or rales.  ENT exam is unremarkable.  Chest x-ray revealing some increased [place] vascular congestion.  Labs obtained revealing BNP greater than 4500, creatinine appears to be around patient's baseline.  Electrolytes and blood counts are otherwise unremarkable.  Patient ambulated in the ED and did well without significant shortness of breath, maintained O2 saturation 99% on room air.  Consult placed to internal medicine team, for  evaluation, I believe pt will benefit from medication  adjustment with possible discharge versus admission for IV management.  Internal medicine evaluated patient in the ED and plans for admission for observation. Appreciate consult.  Patient agreeable to plan.  Discussed with and evaluated by Dr. Vanita Panda.  Final Clinical Impressions(s) / ED Diagnoses   Final diagnoses:  Acute on chronic congestive heart failure, unspecified heart failure type [facility])    ED Discharge Orders    None       Robinson, [place] N, PA-C 06/23/18 1400    Carmin Muskrat, MD 06/23/18 1750

## 2018-06-23 NOTE — ED Notes (Signed)
Lynnett Langlinais daughter want to be contacted with update [phone]

## 2018-06-23 NOTE — ED Notes (Signed)
ED TO INPATIENT HANDOFF REPORT  ED Nurse Name and Phone #: Hulan Amato [phone]  S Name/Age/Gender Kristi Alexander 85 y.o. female Room/Bed: 010C/010C  Code Status   Code Status: Full Code  Home/SNF/Other Home Patient oriented to: self Is this baseline? Yes   Triage Complete: Triage complete  Chief Complaint coughing,weakness  Triage Note Patient came in to see her internal medicine doctor d/t worsening "allergy symptoms," including cough that is now productive (clear mucus) as well as generalized weakness, watery eyes, congestion, runny nose. Resp e/u, skin w/d. Denies fevers/chills, chest pain, shortness of breath. Appointment not until 5/4, but was told to return if symptoms became worse.   Allergies Allergies  Allergen Reactions  . Penicillins Swelling    Has patient had a PCN reaction causing immediate rash, facial/tongue/throat swelling, SOB or lightheadedness with hypotension: Yes Has patient had a PCN reaction causing severe rash involving mucus membranes or skin necrosis: No Has patient had a PCN reaction that required hospitalization: No Has patient had a PCN reaction occurring within the last 10 years: No If all of the above answers are "NO", then may proceed with Cephalosporin use.   . Pravastatin Other (See Comments)    Cramps     Level of Care/Admitting Diagnosis ED Disposition    ED Disposition Condition Comment   Admit  Hospital Area: [facility] [[id number]]  Level of Care: Telemetry Cardiac [103]  Covid Evaluation: N/A  Diagnosis: Acute exacerbation of CHF (congestive heart failure) [facility]) [[id number]]  Admitting Physician: Oda Kilts [[id number]]  Attending Physician: Oda Kilts [phone]  PT Class (Do Not Modify): Observation [104]  PT Acc Code (Do Not Modify): Observation [10022]       B Medical/Surgery History Past Medical History:  Diagnosis Date  . Abnormality of gait 11/22/2014  . Acute exacerbation of CHF  (congestive heart failure) ([place]) 06/23/2018  . Arthritis   . Cyst of breast, right, benign solitary   . Dyslipidemia   . H/O: hysterectomy   . Heart murmur   . History of appendectomy   . History of lumbosacral spine surgery   . Hypertension   . Inguinal hernia   . Lumbar stenosis   . Restless leg syndrome    Past Surgical History:  Procedure Laterality Date  . ABDOMINAL HYSTERECTOMY    . APPENDECTOMY    . BOWEL RESECTION N/A 03/23/2013   Procedure: SMALL BOWEL RESECTION;  Surgeon: Gwenyth Ober, MD;  Location: [facility];  Service: General;  Laterality: N/A;  . BREAST CYST EXCISION     LEFT  . ESOPHAGOGASTRODUODENOSCOPY N/A 04/01/2013   Procedure: ESOPHAGOGASTRODUODENOSCOPY (EGD);  Surgeon: Gwenyth Ober, MD;  Location: [facility] [facility];  Service: General;  Laterality: N/A;  . HERNIA REPAIR    . LAPAROTOMY N/A 03/23/2013   Procedure: EXPLORATORY LAPAROTOMY;  Surgeon: Gwenyth Ober, MD;  Location: [facility];  Service: General;  Laterality: N/A;  . LUMBAR LAMINECTOMY/DECOMPRESSION MICRODISCECTOMY N/A 11/10/2016   Procedure: L4-5 DECOMPRESSION FOR RECURRENT STENOSIS;  Surgeon: Marybelle Killings, MD;  Location: [facility];  Service: Orthopedics;  Laterality: N/A;  . PEG PLACEMENT N/A 04/01/2013   Procedure: PERCUTANEOUS ENDOSCOPIC GASTROSTOMY (PEG) PLACEMENT;  Surgeon: Gwenyth Ober, MD;  [facility];  Service: General;  Laterality: N/A;  . TRACHEOSTOMY     feinstein     A IV Location/Drains/Wounds Patient Lines/Drains/Airways Status   Active Line/Drains/Airways    Name:   Placement date:   Placement time:   Site:  Days:   Peripheral IV 06/23/18 Right Forearm   06/23/18    1422    Forearm   less than 1   Incision (Closed) 11/10/16 Back   11/10/16    1313     590          Intake/Output Last 24 hours No intake or output data in the 24 hours ending 06/23/18 1439  Labs/Imaging Results for orders placed or performed during the hospital encounter of 06/23/18 (from the past 48 hour(s))  Basic  metabolic panel     Status: Abnormal   Collection Time: 06/23/18 10:40 AM  Result Value Ref Range   Sodium 144 135 - 145 mmol/L   Potassium 4.3 3.5 - 5.1 mmol/L   Chloride 110 98 - 111 mmol/L   CO2 22 22 - 32 mmol/L   Glucose, Bld 107 (H) 70 - 99 mg/dL   BUN 24 (H) 8 - 23 mg/dL   Creatinine, Ser 1.24 (H) 0.44 - 1.00 mg/dL   Calcium 9.8 8.9 - 10.3 mg/dL   GFR calc non Af Amer 38 (L) >60 mL/min   GFR calc Af Amer 44 (L) >60 mL/min   Anion gap 12 5 - 15    Comment: Performed at [facility], [address], [place], [place] [address]  CBC with Differential     Status: Abnormal   Collection Time: 06/23/18 10:40 AM  Result Value Ref Range   WBC 5.9 4.0 - 10.5 K/uL   RBC 4.19 3.87 - 5.11 MIL/uL   Hemoglobin 12.1 12.0 - 15.0 g/dL   HCT 38.8 36.0 - 46.0 %   MCV 92.6 80.0 - 100.0 fL   MCH 28.9 26.0 - 34.0 pg   MCHC 31.2 30.0 - 36.0 g/dL   RDW 17.9 (H) 11.5 - 15.5 %   Platelets 232 150 - 400 K/uL   nRBC 0.0 0.0 - 0.2 %   Neutrophils Relative % 68 %   Neutro Abs 4.0 1.7 - 7.7 K/uL   Lymphocytes Relative 17 %   Lymphs Abs 1.0 0.7 - 4.0 K/uL   Monocytes Relative 11 %   Monocytes Absolute 0.7 0.1 - 1.0 K/uL   Eosinophils Relative 3 %   Eosinophils Absolute 0.2 0.0 - 0.5 K/uL   Basophils Relative 1 %   Basophils Absolute 0.0 0.0 - 0.1 K/uL   Immature Granulocytes 0 %   Abs Immature Granulocytes 0.01 0.00 - 0.07 K/uL    Comment: Performed at [facility] [address], [place], [place] [address]  Brain natriuretic peptide     Status: Abnormal   Collection Time: 06/23/18 10:40 AM  Result Value Ref Range   B Natriuretic Peptide >4,500.0 (H) 0.0 - 100.0 pg/mL    Comment: Performed at [facility] [address], [place], [place] [address]   Dg Chest 2 View  Result Date: 06/23/2018 CLINICAL DATA:  Cough and congestion EXAM: CHEST - 2 VIEW COMPARISON:  Chest radiograph April 27, 2018 and chest CT May 05, 2018 FINDINGS: There is extensive fibrosis throughout the lungs  bilaterally, essentially stable. Interstitium is diffusely prominent. No airspace consolidation. There is generalized cardiomegaly with slight pulmonary venous hypertension. No adenopathy. There is aortic atherosclerosis. There is arthropathy in each shoulder. IMPRESSION: Stable cardiomegaly with a degree of pulmonary vascular congestion. Generalized fibrosis. There may be mild chronic interstitial edema superimposed. No consolidation. There may well be a degree of chronic congestive heart failure superimposed on fibrosis. No new opacity evident.  No consolidation. Aortic Atherosclerosis (ICD10-I70.0). Electronically Signed   By: Lowella Grip III M.D.   On: 06/23/2018 09:43    Pending Labs Unresulted Labs (From admission, onward)    Start     Ordered   06/30/18 0500  Creatinine, serum  (enoxaparin (LOVENOX)    CrCl >/= 30 ml/min)  Weekly,   R    Comments:  while on enoxaparin therapy    06/23/18 1340   06/24/18 5400  Basic metabolic panel  Daily,   R     06/23/18 1340   06/23/18 1335  CBC  (enoxaparin (LOVENOX)    CrCl >/= 30 ml/min)  Once,   R    Comments:  Baseline for enoxaparin therapy IF NOT ALREADY DRAWN.  Notify MD if PLT < 100 K.    06/23/18 1340   06/23/18 1335  Creatinine, serum  (enoxaparin (LOVENOX)    CrCl >/= 30 ml/min)  Once,   R    Comments:  Baseline for enoxaparin therapy IF NOT ALREADY DRAWN.    06/23/18 1340   06/23/18 1248  Troponin I - Now Then Q6H  Now then every 6 hours,   R     06/23/18 1247          Vitals/Pain Today's Vitals   06/23/18 1045 06/23/18 1315 06/23/18 1330 06/23/18 1400  BP: (!) 159/67 (!) 164/90 (!) 167/108 (!) 169/83  Pulse: 78 85 82   Resp: 16 (!) 22 13 (!) 25  Temp:      TempSrc:      SpO2: 97% 90% 100%   PainSc:        Isolation Precautions No active isolations  Medications Medications  sodium chloride flush (NS) 0.9 % injection 3 mL (has no administration in time range)  sodium chloride flush (NS) 0.9 % injection 3 mL (has  no administration in time range)  0.9 %  sodium chloride infusion (has no administration in time range)  acetaminophen (TYLENOL) tablet 650 mg (has no administration in time range)  ondansetron (ZOFRAN) injection 4 mg (has no administration in time range)  enoxaparin (LOVENOX) injection 40 mg (has no administration in time range)  furosemide (LASIX) injection 40 mg (has no administration in time range)  amLODipine (NORVASC) tablet 10 mg (has no administration in time range)  atorvastatin (LIPITOR) tablet 20 mg (has no administration in time range)  loratadine (CLARITIN) tablet 10 mg (has no administration in time range)  fluticasone (FLONASE) 50 MCG/ACT nasal spray 1 spray (has no administration in time range)  isosorbide mononitrate (IMDUR) 24 hr tablet 15 mg (has no administration in time range)  metoprolol succinate (TOPROL-XL) 24 hr tablet 25 mg (has no administration in time range)  spironolactone (ALDACTONE) tablet 25 mg (has no administration in time range)    Mobility walks with device Low fall risk   Focused Assessments Cardiac Assessment Handoff:    Lab Results  Component Value Date   CKTOTAL 93 11/22/2014   TROPONINI <0.03 06/01/2014   No results found for: DDIMER Does the Patient currently have chest pain? Yes     R Recommendations: See Admitting Provider Note  Report given to:   Additional Notes: .

## 2018-06-24 ENCOUNTER — Observation Stay (HOSPITAL_BASED_OUTPATIENT_CLINIC_OR_DEPARTMENT_OTHER): Payer: Medicare Other

## 2018-06-24 DIAGNOSIS — J849 Interstitial pulmonary disease, unspecified: Secondary | ICD-10-CM | POA: Diagnosis present

## 2018-06-24 DIAGNOSIS — E785 Hyperlipidemia, unspecified: Secondary | ICD-10-CM | POA: Diagnosis present

## 2018-06-24 DIAGNOSIS — I361 Nonrheumatic tricuspid (valve) insufficiency: Secondary | ICD-10-CM

## 2018-06-24 DIAGNOSIS — I272 Pulmonary hypertension, unspecified: Secondary | ICD-10-CM | POA: Diagnosis present

## 2018-06-24 DIAGNOSIS — I440 Atrioventricular block, first degree: Secondary | ICD-10-CM

## 2018-06-24 DIAGNOSIS — Z79899 Other long term (current) drug therapy: Secondary | ICD-10-CM | POA: Diagnosis not present

## 2018-06-24 DIAGNOSIS — I5033 Acute on chronic diastolic (congestive) heart failure: Secondary | ICD-10-CM

## 2018-06-24 DIAGNOSIS — I739 Peripheral vascular disease, unspecified: Secondary | ICD-10-CM | POA: Diagnosis present

## 2018-06-24 DIAGNOSIS — G2581 Restless legs syndrome: Secondary | ICD-10-CM | POA: Diagnosis present

## 2018-06-24 DIAGNOSIS — I110 Hypertensive heart disease with heart failure: Secondary | ICD-10-CM | POA: Diagnosis not present

## 2018-06-24 DIAGNOSIS — M48061 Spinal stenosis, lumbar region without neurogenic claudication: Secondary | ICD-10-CM | POA: Diagnosis present

## 2018-06-24 DIAGNOSIS — I5082 Biventricular heart failure: Secondary | ICD-10-CM

## 2018-06-24 DIAGNOSIS — N182 Chronic kidney disease, stage 2 (mild): Secondary | ICD-10-CM | POA: Diagnosis present

## 2018-06-24 DIAGNOSIS — N179 Acute kidney failure, unspecified: Secondary | ICD-10-CM | POA: Diagnosis present

## 2018-06-24 DIAGNOSIS — I5021 Acute systolic (congestive) heart failure: Secondary | ICD-10-CM | POA: Diagnosis not present

## 2018-06-24 DIAGNOSIS — Z7951 Long term (current) use of inhaled steroids: Secondary | ICD-10-CM | POA: Diagnosis not present

## 2018-06-24 DIAGNOSIS — I340 Nonrheumatic mitral (valve) insufficiency: Secondary | ICD-10-CM

## 2018-06-24 DIAGNOSIS — I255 Ischemic cardiomyopathy: Secondary | ICD-10-CM | POA: Diagnosis present

## 2018-06-24 DIAGNOSIS — I071 Rheumatic tricuspid insufficiency: Secondary | ICD-10-CM

## 2018-06-24 DIAGNOSIS — Z888 Allergy status to other drugs, medicaments and biological substances status: Secondary | ICD-10-CM | POA: Diagnosis not present

## 2018-06-24 DIAGNOSIS — R011 Cardiac murmur, unspecified: Secondary | ICD-10-CM | POA: Diagnosis not present

## 2018-06-24 DIAGNOSIS — Z87891 Personal history of nicotine dependence: Secondary | ICD-10-CM | POA: Diagnosis not present

## 2018-06-24 DIAGNOSIS — I509 Heart failure, unspecified: Secondary | ICD-10-CM | POA: Diagnosis present

## 2018-06-24 DIAGNOSIS — Z880 Allergy status to penicillin: Secondary | ICD-10-CM | POA: Diagnosis not present

## 2018-06-24 DIAGNOSIS — M1990 Unspecified osteoarthritis, unspecified site: Secondary | ICD-10-CM | POA: Diagnosis present

## 2018-06-24 DIAGNOSIS — I130 Hypertensive heart and chronic kidney disease with heart failure and stage 1 through stage 4 chronic kidney disease, or unspecified chronic kidney disease: Secondary | ICD-10-CM | POA: Diagnosis present

## 2018-06-24 DIAGNOSIS — R7303 Prediabetes: Secondary | ICD-10-CM | POA: Diagnosis present

## 2018-06-24 DIAGNOSIS — D519 Vitamin B12 deficiency anemia, unspecified: Secondary | ICD-10-CM | POA: Diagnosis present

## 2018-06-24 DIAGNOSIS — D638 Anemia in other chronic diseases classified elsewhere: Secondary | ICD-10-CM | POA: Diagnosis present

## 2018-06-24 DIAGNOSIS — J302 Other seasonal allergic rhinitis: Secondary | ICD-10-CM | POA: Diagnosis present

## 2018-06-24 DIAGNOSIS — E559 Vitamin D deficiency, unspecified: Secondary | ICD-10-CM | POA: Diagnosis present

## 2018-06-24 DIAGNOSIS — I5023 Acute on chronic systolic (congestive) heart failure: Secondary | ICD-10-CM | POA: Diagnosis present

## 2018-06-24 DIAGNOSIS — I2510 Atherosclerotic heart disease of native coronary artery without angina pectoris: Secondary | ICD-10-CM | POA: Diagnosis present

## 2018-06-24 LAB — BASIC METABOLIC PANEL
Anion gap: 13 (ref 5–15)
BUN: 24 mg/dL — ABNORMAL HIGH (ref 8–23)
CO2: 22 mmol/L (ref 22–32)
Calcium: 9.7 mg/dL (ref 8.9–10.3)
Chloride: 107 mmol/L (ref 98–111)
Creatinine, Ser: 1.25 mg/dL — ABNORMAL HIGH (ref 0.44–1.00)
GFR calc Af Amer: 44 mL/min — ABNORMAL LOW (ref >60)
GFR calc non Af Amer: 38 mL/min — ABNORMAL LOW (ref >60)
Glucose, Bld: 116 mg/dL — ABNORMAL HIGH (ref 70–99)
Potassium: 4 mmol/L (ref 3.5–5.1)
Sodium: 142 mmol/L (ref 135–145)

## 2018-06-24 LAB — ECHOCARDIOGRAM COMPLETE
Height: 61 in
Weight: 2110.4 oz

## 2018-06-24 LAB — TROPONIN I: Troponin I: <0.03 ng/mL

## 2018-06-24 MED ORDER — ISOSORBIDE MONONITRATE ER 30 MG PO TB24
30.0000 mg | ORAL_TABLET | Freq: Every day | ORAL | Status: DC
  Administered 2018-06-25 – 2018-06-26 (×2): 30 mg via ORAL
  Filled 2018-06-24 (×2): qty 1

## 2018-06-24 MED ORDER — FUROSEMIDE 10 MG/ML IJ SOLN
40.0000 mg | Freq: Two times a day (BID) | INTRAMUSCULAR | Status: DC
  Administered 2018-06-24 – 2018-06-25 (×3): 40 mg via INTRAVENOUS
  Filled 2018-06-24 (×3): qty 4

## 2018-06-24 MED ORDER — CARVEDILOL 6.25 MG PO TABS
6.2500 mg | ORAL_TABLET | Freq: Two times a day (BID) | ORAL | Status: DC
  Administered 2018-06-24 – 2018-06-26 (×4): 6.25 mg via ORAL
  Filled 2018-06-24 (×4): qty 1

## 2018-06-24 MED ORDER — LOSARTAN POTASSIUM 50 MG PO TABS
50.0000 mg | ORAL_TABLET | Freq: Every day | ORAL | Status: DC
  Administered 2018-06-24: 10:00:00 50 mg via ORAL
  Filled 2018-06-24 (×3): qty 1

## 2018-06-24 MED ORDER — FUROSEMIDE 10 MG/ML IJ SOLN: 40.0000 mg | Freq: Two times a day (BID) | INTRAMUSCULAR | Status: DC

## 2018-06-24 MED ORDER — ENSURE ENLIVE PO LIQD
237.0000 mL | Freq: Two times a day (BID) | ORAL | Status: DC
  Administered 2018-06-24 – 2018-06-26 (×5): 237 mL via ORAL

## 2018-06-24 MED ORDER — ENOXAPARIN SODIUM 30 MG/0.3ML ~~LOC~~ SOLN
30.0000 mg | SUBCUTANEOUS | Status: DC
  Administered 2018-06-25 (×2): 30 mg via SUBCUTANEOUS
  Filled 2018-06-24 (×2): qty 0.3

## 2018-06-24 NOTE — Progress Notes (Signed)
SATURATION QUALIFICATIONS: (This note is used to comply with regulatory documentation for home oxygen)  Patient Saturations on Room Air at Rest = 98%  Patient Saturations on Room Air while Ambulating = 100%   Please briefly explain why patient needs home oxygen: Patient does not qualify for home O2.

## 2018-06-24 NOTE — Evaluation (Signed)
Physical Therapy Evaluation/ Discharge  Patient Details Name: Kristi Alexander MRN: [id number] DOB: Jun 29, 1925 Today's Date: 06/24/2018   History of Present Illness   85 year old female with HTN, HLD, interstitial lung disease, chronic diastolic heart failure and seasonal allergies presenting to the ED with worsening cough and fatigue with CHF  Clinical Impression  Pt very pleasant, joking and telling about how she met her boyfriend 1.5 years ago. Pt walks with a quad cane, cooks, cares for herself at home and is still driving. Daughter lives with her and assists with managing finances, homemaking and shopping. Pt has assist as needed at home, is currently moving at baseline functional level and does not require further therapy intervention at this time. Pt encouraged to continue walking daily acutely and at home. Pt able to don shoes, underwear and perform pericare all without assist.  HR 102-107 with gait    Follow Up Recommendations No PT follow up    Equipment Recommendations  None recommended by PT    Recommendations for Other Services       Precautions / Restrictions Precautions Precautions: Fall      Mobility  Bed Mobility               General bed mobility comments: sitting on BSC on arrival  Transfers Overall transfer level: Modified independent               General transfer comment: pt able to stand from [facility] and to recliner without assist  Ambulation/Gait Ambulation/Gait assistance: Modified independent (Device/Increase time) Gait Distance (Feet): 275 Feet Assistive device: Quad cane Gait Pattern/deviations: Step-through pattern;Decreased stride length;Trunk flexed   Gait velocity interpretation: >2.62 ft/sec, indicative of community ambulatory General Gait Details: pt with good use of quad cane throughout with one standing rest halfway through gait, one LOB on return to room with pt able to recover without assist with use of cane  Stairs             Wheelchair Mobility    Modified Rankin (Stroke Patients Only)       Balance Overall balance assessment: Mild deficits observed, not formally tested                                           Pertinent Vitals/Pain Pain Assessment: No/denies pain    Home Living Family/patient expects to be discharged to:: Private residence Living Arrangements: Children Available Help at Discharge: Family;Available 24 hours/day Type of Home: House Home Access: Level entry     Home Layout: One level Home Equipment: Walker - 2 wheels;Cane - single point;Cane - quad;Shower seat      Prior Function Level of Independence: Independent with assistive device(s)         Comments: walks with quad cane as needed     Hand Dominance        Extremity/Trunk Assessment   Upper Extremity Assessment Upper Extremity Assessment: Overall WFL for tasks assessed    Lower Extremity Assessment Lower Extremity Assessment: Overall WFL for tasks assessed    Cervical / Trunk Assessment Cervical / Trunk Assessment: Kyphotic  Communication   Communication: No difficulties  Cognition Arousal/Alertness: Awake/alert Behavior During Therapy: WFL for tasks assessed/performed Overall Cognitive Status: Within Functional Limits for tasks assessed  General Comments      Exercises     Assessment/Plan    PT Assessment Patent does not need any further PT services  PT Problem List         PT Treatment Interventions      PT Goals (Current goals can be found in the Care Plan section)  Acute Rehab PT Goals Patient Stated Goal: return home PT Goal Formulation: All assessment and education complete, DC therapy    Frequency     Barriers to discharge        Co-evaluation               AM-PAC PT "6 Clicks" Mobility  Outcome Measure Help needed turning from your back to your side while in a flat bed without using  bedrails?: None Help needed moving from lying on your back to sitting on the side of a flat bed without using bedrails?: None Help needed moving to and from a bed to a chair (including a wheelchair)?: A Little Help needed standing up from a chair using your arms (e.g., wheelchair or bedside chair)?: A Little Help needed to walk in hospital room?: A Little Help needed climbing 3-5 steps with a railing? : A Little 6 Click Score: 20    End of Session Equipment Utilized During Treatment: Gait belt Activity Tolerance: Patient tolerated treatment well Patient left: in chair;with call bell/phone within reach Nurse Communication: Mobility status PT Visit Diagnosis: Other abnormalities of gait and mobility (R26.89)    Time: 1255-1318 PT Time Calculation (min) (ACUTE ONLY): 23 min   Charges:   PT Evaluation $PT Eval Moderate Complexity: [place] Pager: [phone] Office: [place] 06/24/2018, 1:23 PM

## 2018-06-24 NOTE — Progress Notes (Signed)
PHARMACY NOTE -  ANTIBIOTIC RENAL DOSE ADJUSTMENT    Patient has been initiated on Lovenox 40 mg for DVT prophylaxis. SCr 1.25, estimated CrCl ~ 24 ml/min  Adjusted lovenox to 30 mg q 24 hrs   Kristi Alexander, [facility] Clinical Pharmacist Phone [phone]  06/24/2018 3:55 PM

## 2018-06-24 NOTE — TOC Initial Note (Signed)
Transition of Care [facility]) - Initial/Assessment Note    Patient Details  Name: Kristi Alexander MRN: [id number] Date of Birth: 09/08/1925  Transition of Care [facility]) CM/SW Contact:    Sherrilyn Rist Phone Number: [phone] 06/24/2018, 1:45 PM  Clinical Narrative:                 Patient lives at home with daughter; Mike Craze, DO; has private insurance with [facility] with prescription drug coverage; DME - cane at home; is active with [facility] [place] / PT as prior to admission. CM will continue to follow for progression of care.  Expected Discharge Plan: Home/Self Care Barriers to Discharge: No Barriers Identified   Patient Goals and CMS Choice Patient states their goals for this hospitalization and ongoing recovery are:: to stay at home CMS Medicare.gov Compare Post Acute Care list provided to:: Patient Choice offered to / list presented to : NA  Expected Discharge Plan and Services Expected Discharge Plan: Home/Self Care In-house Referral: NA Discharge Planning Services: CM Consult Post Acute Care Choice: NA Living arrangements for the past 2 months: Single Family Home                 DME Arranged: N/A DME Agency: NA         HH Agency: NA        Prior Living Arrangements/Services Living arrangements for the past 2 months: [place] Lives with:: Adult Children          Need for Family Participation in Patient Care: Yes (Comment) Care giver support system in place?: Yes (comment) Current home services: Home PT, Home RN Criminal Activity/Legal Involvement Pertinent to Current Situation/Hospitalization: No - Comment as needed  Activities of Daily Living Home Assistive Devices/Equipment: Cane (specify quad or straight) ADL Screening (condition at time of admission) Patient's cognitive ability adequate to safely complete daily activities?: Yes Is the patient deaf or have difficulty hearing?: No Does the patient  have difficulty seeing, even when wearing glasses/contacts?: No Does the patient have difficulty concentrating, remembering, or making decisions?: No Patient able to express need for assistance with ADLs?: Yes Does the patient have difficulty dressing or bathing?: No Independently performs ADLs?: Yes (appropriate for developmental age) Does the patient have difficulty walking or climbing stairs?: Yes Weakness of Legs: Both Weakness of Arms/Hands: None  Permission Sought/Granted Permission sought to share information with : Case Manager Permission granted to share information with : Yes, Verbal Permission Granted              Emotional Assessment         Alcohol / Substance Use: Not Applicable Psych Involvement: No (comment)  Admission diagnosis:  Acute on chronic congestive heart failure, unspecified heart failure type [facility]) [I50.9] Patient Active Problem List   Diagnosis Date Noted  . Acute exacerbation of CHF (congestive heart failure) ([place]) 06/23/2018  . Environmental allergies 06/10/2018  . Interstitial lung disease ([place]) 04/28/2018  . Takes iron supplements 03/10/2018  . Impingement syndrome of left shoulder 11/10/2017  . History of lumbar laminectomy for spinal cord decompression 08/06/2017  . GI bleed 01/10/2017  . Atherosclerosis of native artery of both lower extremities with intermittent claudication ([place]) 08/08/2016  . Peripheral arterial disease ([place]) 08/04/2016  . Vitamin D deficiency 11/28/2015  . Osteopenia 09/16/2015  . Pain in joint, shoulder region 09/16/2015  . Spinal stenosis of lumbar region 01/24/2015  . Preventative health care 12/11/2014  . Essential hypertension  12/11/2014  . Pulmonary hypertension ([place])   . Chronic cough   . Chronic diastolic heart failure ([place]) 05/15/2013  . Pre-diabetes 05/14/2013  . Other vitamin B12 deficiency anemia 07/13/2012  . Restless legs syndrome (RLS) 07/13/2012   PCP:  Mike Craze, DO Pharmacy:   [place], [place] [facility] [address] [place] [address] Phone: [phone] Fax: [phone]     Social Determinants of Health (SDOH) Interventions    Readmission Risk Interventions No flowsheet data found.

## 2018-06-24 NOTE — Plan of Care (Signed)

## 2018-06-24 NOTE — Progress Notes (Signed)
  Echocardiogram 2D Echocardiogram has been performed.  Kristi Alexander 06/24/2018, 10:38 AM

## 2018-06-24 NOTE — Discharge Summary (Addendum)
Name: Kristi Alexander MRN: [id number] DOB: Feb 25, 1926 85 y.o. PCP: Mike Craze, DO  Date of Admission: 06/23/2018  9:19 AM Date of Discharge: 06/26/2018 Attending Physician: Gilles Chiquito  Discharge Diagnosis: 1. HFrEF 2. HTN 3. AKI  Discharge Medications: Allergies as of 06/26/2018      Reactions   Penicillins Swelling   Has patient had a PCN reaction causing immediate rash, facial/tongue/throat swelling, SOB or lightheadedness with hypotension: Yes Has patient had a PCN reaction causing severe rash involving mucus membranes or skin necrosis: No Has patient had a PCN reaction that required hospitalization: No Has patient had a PCN reaction occurring within the last 10 years: No If all of the above answers are "NO", then may proceed with Cephalosporin use.   Pravastatin Other (See Comments)   Cramps       Medication List    STOP taking these medications   amLODipine 10 MG tablet Commonly known as:  NORVASC   hydrochlorothiazide 25 MG tablet Commonly known as:  HYDRODIURIL   metoprolol succinate 25 MG 24 hr tablet Commonly known as:  TOPROL-XL   psyllium 0.52 g capsule Commonly known as:  Metamucil     TAKE these medications   acetaminophen 500 MG tablet Commonly known as:  TYLENOL Take 500 mg every 6 (six) hours as needed by mouth (pain).   atorvastatin 20 MG tablet Commonly known as:  LIPITOR Take 20 mg by mouth daily at 6 PM.   Calcium-Vitamin D 600-400 MG-UNIT Tabs Take 1 tablet by mouth once daily   carvedilol 6.25 MG tablet Commonly known as:  COREG Take 1 tablet (6.25 mg total) by mouth 2 (two) times daily with a meal.   cetirizine 10 MG tablet Commonly known as:  ZYRTEC Take 10 mg daily as needed by mouth for allergies.   diclofenac sodium 1 % Gel Commonly known as:  VOLTAREN Apply 2 g topically 4 (four) times daily. What changed:    when to take this  reasons to take this   ferrous sulfate 325 (65 FE) MG tablet Take 325 mg daily with  breakfast by mouth.   fluticasone 50 MCG/ACT nasal spray Commonly known as:  FLONASE Place 1 spray daily as needed into both nostrils for allergies or rhinitis.   furosemide 20 MG tablet Commonly known as:  Lasix Take one tablet daily.  If you gain 3lbs in one day or 5lbs in a week take an extra tablet and call your doctor   hydrOXYzine 25 MG tablet Commonly known as:  ATARAX/VISTARIL TAKE 1 TABLET BY MOUTH ONCE DAILY AS NEEDED What changed:    how much to take  how to take this  when to take this  reasons to take this  additional instructions   isosorbide mononitrate 30 MG 24 hr tablet Commonly known as:  IMDUR Take 1 tablet (30 mg total) by mouth daily. What changed:  See the new instructions.   multivitamin with minerals Tabs tablet Take 1 tablet by mouth daily.   sacubitril-valsartan 49-51 MG Commonly known as:  ENTRESTO Take 1 tablet by mouth 2 (two) times daily.   spironolactone 50 MG tablet Commonly known as:  ALDACTONE Take 0.5 tablets (25 mg total) by mouth daily. What changed:  how much to take   Vitamin D (Ergocalciferol) 1.25 MG (50000 UT) Caps capsule Commonly known as:  DRISDOL Take 1 capsule (50,000 Units total) by mouth every 7 (seven) days.       Disposition and follow-up:  [name] was discharged from [facility] in Good condition.  At the hospital follow up visit please address:  HFrEF/HTN- discharge weight was 57.289kg,  go up on entresto if bp allows.  Keep spiro at 50mg .  Check renal function and assess volume status adjust lasix as needed  AKI- Cr on admission 1.24, baseline below 1 3 months ago, improved some with diuresis new baseline may be closer to 1.2.     2.  Labs / imaging needed at time of follow-up: bmp  3.  Pending labs/ test needing follow-up:   Follow-up Appointments: Follow-up Information    [facility] Follow [facility].   Specialty:  [place] Why:  A home health  care nurse and physical therapy will continue to go to your home       [place], Areeg N, DO Follow up on 07/01/2018.   Specialty:  Internal Medicine Why:  follow up in our clinic on 06/30/2008 Contact information: [address] [place] [address] [phone]        Charolette Forward, MD Follow up in 2 week(s).   Specialty:  Cardiology Why:  Please follow up with your cardiologist at his next available appointment  Contact information: [address] [place] [address] [phone]           Hospital Course by problem list:  HFrEF/HTN- presented to the ED with worsening coughand fatigue.Chest x-ray showed pulmonary vascular congestion and BNP was elevated greater than 4500. No ischemic changes on ecg. ECHO showed new reduced EF with EF 25% but no RWMA.  Pt evaluated by her cardiologist in the hospital.  Felt this was worsening hypertensive cardiomyopathy no plans for ischemic eval.  Medication regimen altered to favor HFrEF therapies see list above.  She was diuresed to euvolemia and discharged at 57.3kg.    AKI- Cr on admission 1.24, baseline below 1 3 months ago, improved some with diuresis new baseline may be closer to 1.2.     Discharge Vitals:   BP (!) 143/87 (BP Location: Left Arm)   Pulse 75   Temp 98 F (36.7 C) (Oral)   Resp 16   Ht 5\' 1"  (1.549 m)   Wt 57.3 kg   SpO2 96%   BMI 23.86 kg/m   Pertinent Labs, Studies, and Procedures:  CBC Latest Ref Rng & Units 06/23/2018 06/23/2018 04/27/2018  WBC 4.0 - 10.5 K/uL 5.8 5.9 6.6  Hemoglobin 12.0 - 15.0 g/dL 12.3 12.1 12.1  Hematocrit 36.0 - 46.0 % 38.8 38.8 38.9  Platelets 150 - 400 K/uL 220 232 256   BMP Latest Ref Rng & Units 06/26/2018 06/25/2018 06/24/2018  Glucose 70 - 99 mg/dL 124(H) 118(H) 116(H)  BUN 8 - 23 mg/dL 22 23 24(H)  Creatinine 0.44 - 1.00 mg/dL 1.21(H) 1.21(H) 1.25(H)  BUN/Creat Ratio 12 - 28 - - -  Sodium 135 - 145 mmol/L 142 143 142  Potassium 3.5 - 5.1 mmol/L 3.6 3.5 4.0   Chloride 98 - 111 mmol/L 104 103 107  CO2 22 - 32 mmol/L 28 27 22   Calcium 8.9 - 10.3 mg/dL 9.0 9.1 9.7   IMPRESSIONS    1. The left ventricle has severely reduced systolic function, with an ejection fraction of 20-25%. The cavity size was normal. There is moderately increased left ventricular wall thickness. Left ventricular diastolic Doppler parameters are consistent  with impaired relaxation.  2. The right ventricle has moderately reduced systolic function. The cavity was  normal. There is moderately increased right ventricular wall thickness. Right ventricular systolic pressure is moderately elevated with an estimated pressure of 54.7 mmHg.  3. Right atrial size was mildly dilated.  4. Trivial pericardial effusion is present.  5. Mildly thickened tricuspid valve leaflets.  6. The mitral valve is grossly normal. Mild thickening of the mitral valve leaflet.  7. The tricuspid valve is grossly normal. Tricuspid valve regurgitation is moderate.  8. The aortic valve is tricuspid. Mild thickening of the aortic valve. Mild calcification of the aortic valve. No stenosis of the aortic valve.  9. The aortic root is normal in size and structure. 10. The inferior vena cava was dilated in size with <50% respiratory variability. 11. The interatrial septum was not assessed.  FINDINGS  Left Ventricle: The left ventricle has severely reduced systolic function, with an ejection fraction of 20-25%. The cavity size was normal. There is moderately increased left ventricular wall thickness. Left ventricular diastolic Doppler parameters are  consistent with impaired relaxation.  Right Ventricle: The right ventricle has moderately reduced systolic function. The cavity was normal. There is moderately increased right ventricular wall thickness. Right ventricular systolic pressure is moderately elevated with an estimated pressure of  54.7 mmHg.  Left Atrium: Left atrial size was normal in size.  Right  Atrium: Right atrial size was mildly dilated. Right atrial pressure is estimated at 15 mmHg.  Interatrial Septum: The interatrial septum was not assessed.  Pericardium: Trivial pericardial effusion is present.  Mitral Valve: The mitral valve is grossly normal. Mild thickening of the mitral valve leaflet. Mitral valve regurgitation is mild by color flow Doppler.  Tricuspid Valve: The tricuspid valve is grossly normal. Tricuspid valve regurgitation is moderate by color flow Doppler. The tricuspid valve is mildly thickened.  Aortic Valve: The aortic valve is tricuspid Mild thickening of the aortic valve. Mild calcification of the aortic valve. Aortic valve regurgitation was not visualized by color flow Doppler. There is No stenosis of the aortic valve.  Pulmonic Valve: The pulmonic valve was grossly normal. Pulmonic valve regurgitation is trivial by color flow Doppler.  Aorta: The aortic root is normal in size and structure.  Venous: The inferior vena cava is dilated in size with less than 50% respiratory variability.    CLINICAL DATA:  Cough and congestion  EXAM: CHEST - 2 VIEW  COMPARISON:  Chest radiograph April 27, 2018 and chest CT May 05, 2018  FINDINGS: There is extensive fibrosis throughout the lungs bilaterally, essentially stable. Interstitium is diffusely prominent. No airspace consolidation. There is generalized cardiomegaly with slight pulmonary venous hypertension. No adenopathy. There is aortic atherosclerosis. There is arthropathy in each shoulder.  IMPRESSION: Stable cardiomegaly with a degree of pulmonary vascular congestion. Generalized fibrosis. There may be mild chronic interstitial edema superimposed. No consolidation. There may well be a degree of chronic congestive heart failure superimposed on fibrosis. No new opacity evident. No consolidation. Aortic Atherosclerosis (ICD10-I70.0).   Electronically Signed   By: Lowella Grip III  M.D.   On: 06/23/2018 09:43    Discharge Instructions: Discharge Instructions    Diet - low sodium heart healthy   Complete by:  As directed    Discharge instructions   Complete by:  As directed    Ms. Buskey you are doing well. Please keep track of your weight daily and follow the instructions below.  We will need you to follow up in our clinic Thursday of next week to see how you're doing.    One of  your heart tests showed weakness of the heart muscle this admission. This may make you more susceptible to weight gain from fluid retention, which can lead to symptoms that we call heart failure. Please follow these special instructions:  1. Follow a low-salt diet - you are allowed no more than 2,000mg  of sodium per day. Watch your fluid intake. In general, you should not be taking in more than 2 liters of fluid per day (no more than 8 glasses per day). This includes sources of water in foods like soup, coffee, tea, milk, etc. 2. Weigh yourself on the same scale at same time of day and keep a log. 3. Call your doctor: (Anytime you feel any of the following symptoms)  - 3lb weight gain overnight or 5lb within a few days - Shortness of breath, with or without a dry hacking cough  - Swelling in the hands, feet or stomach  - If you have to sleep on extra pillows at night in order to breathe   Please let us know early on if you're having symptoms.   Increase activity slowly   Complete by:  As directed       Signed: Katherine Roan, MD 06/27/2018, 11:53 AM

## 2018-06-24 NOTE — Consult Note (Signed)
Reason for Consult:CHF/new LV systolic dysfunction  Referring Physician: [facility], hospitalist  Kristi Alexander is an 85 y.o. female.  Kristi Alexander is a 85 year old female with past medical history is significant for nonobstructive CAD in the past, hypertension, chronic diastolic congestive heart failure, hyperlipidemia, interstitial lung disease, degenerative joint disease, history of strangulated small intestine requiring partial small bowel resection in the past,was admitted yesterday because of progressive worsening shortness of breath associated with congestion and productive cough.  Patient denies any fever or chills.  Denies sore throat.  Denies any chest pain.  Denies palpitations, lightheadedness or syncope.  Patient was noted to have elevated BNP.  Chest x-ray showed worsening CHF on top of chronic interstitial disease.  Patient was diuresed with IV Lasix with improvement in her symptoms.  Patient denies any excessive salty food intake. Denies any fever, chills or urinary complaints. Denies noncompliance to medications.  Patient was seen in my office approximately 2 weeks ago and was noted to have elevated blood pressure.  Her isosorbide mononitrate dose was increased to 60 mg daily.2-D echo done this admission showed global hypokinesia with EF between 25-35% with moderate tricuspid regurgitation with global hypokinesia, but no segmental wall motion abnormalities.  Prior echo showed EF approximately 50-55%. EKG showed normal sinus rhythm with lateral T-wave inversion which are old as compared to prior EKGs  Past Medical History:  Diagnosis Date  . Abnormality of gait 11/22/2014  . Acute exacerbation of CHF (congestive heart failure) ([place]) 06/23/2018  . Arthritis   . Cyst of breast, right, benign solitary   . Dyslipidemia   . H/O: hysterectomy   . Heart murmur   . History of appendectomy   . History of lumbosacral spine surgery   . Hypertension   . Inguinal hernia   . Lumbar stenosis   .  Restless leg syndrome     Past Surgical History:  Procedure Laterality Date  . ABDOMINAL HYSTERECTOMY    . APPENDECTOMY    . BOWEL RESECTION N/A 03/23/2013   Procedure: SMALL BOWEL RESECTION;  Surgeon: Gwenyth Ober, MD;  Location: [facility];  Service: General;  Laterality: N/A;  . BREAST CYST EXCISION     LEFT  . ESOPHAGOGASTRODUODENOSCOPY N/A 04/01/2013   Procedure: ESOPHAGOGASTRODUODENOSCOPY (EGD);  Surgeon: Gwenyth Ober, MD;  Location: [facility];  Service: General;  Laterality: N/A;  . HERNIA REPAIR    . LAPAROTOMY N/A 03/23/2013   Procedure: EXPLORATORY LAPAROTOMY;  Surgeon: Gwenyth Ober, MD;  Location: [facility];  Service: General;  Laterality: N/A;  . LUMBAR LAMINECTOMY/DECOMPRESSION MICRODISCECTOMY N/A 11/10/2016   Procedure: L4-5 DECOMPRESSION FOR RECURRENT STENOSIS;  Surgeon: Marybelle Killings, MD;  Location: [facility];  Service: Orthopedics;  Laterality: N/A;  . PEG PLACEMENT N/A 04/01/2013   Procedure: PERCUTANEOUS ENDOSCOPIC GASTROSTOMY (PEG) PLACEMENT;  Surgeon: Gwenyth Ober, MD;  Location: [facility];  Service: General;  Laterality: N/A;  . PEG TUBE REMOVAL    . TRACHEOSTOMY     feinstein  . TRACHEOSTOMY CLOSURE      Family History  Problem Relation Age of Onset  . Heart disease Mother   . Diabetes Mother   . Heart attack Father   . Lung cancer Brother   . Healthy Sister   . Stomach cancer Son     Social History:  reports that she quit smoking about 15 years ago. Her smoking use included cigarettes. She smoked 0.00 packs per day. She has never used smokeless tobacco. She reports current alcohol use. She reports that  she does not use drugs.  Allergies:  Allergies  Allergen Reactions  . Penicillins Swelling    Has patient had a PCN reaction causing immediate rash, facial/tongue/throat swelling, SOB or lightheadedness with hypotension: Yes Has patient had a PCN reaction causing severe rash involving mucus membranes or skin necrosis: No Has patient had a PCN reaction that  required hospitalization: No Has patient had a PCN reaction occurring within the last 10 years: No If all of the above answers are "NO", then may proceed with Cephalosporin use.   . Pravastatin Other (See Comments)    Cramps     Medications: I have reviewed the patient's current medications.  Results for orders placed or performed during the hospital encounter of 06/23/18 (from the past 48 hour(s))  Basic metabolic panel     Status: Abnormal   Collection Time: 06/23/18 10:40 AM  Result Value Ref Range   Sodium 144 135 - 145 mmol/L   Potassium 4.3 3.5 - 5.1 mmol/L   Chloride 110 98 - 111 mmol/L   CO2 22 22 - 32 mmol/L   Glucose, Bld 107 (H) 70 - 99 mg/dL   BUN 24 (H) 8 - 23 mg/dL   Creatinine, Ser 1.24 (H) 0.44 - 1.00 mg/dL   Calcium 9.8 8.9 - 10.3 mg/dL   GFR calc non Af Amer 38 (L) >60 mL/min   GFR calc Af Amer 44 (L) >60 mL/min   Anion gap 12 5 - 15    Comment: Performed at [facility], [address], [place], [place] [address]  CBC with Differential     Status: Abnormal   Collection Time: 06/23/18 10:40 AM  Result Value Ref Range   WBC 5.9 4.0 - 10.5 K/uL   RBC 4.19 3.87 - 5.11 MIL/uL   Hemoglobin 12.1 12.0 - 15.0 g/dL   HCT 38.8 36.0 - 46.0 %   MCV 92.6 80.0 - 100.0 fL   MCH 28.9 26.0 - 34.0 pg   MCHC 31.2 30.0 - 36.0 g/dL   RDW 17.9 (H) 11.5 - 15.5 %   Platelets 232 150 - 400 K/uL   nRBC 0.0 0.0 - 0.2 %   Neutrophils Relative % 68 %   Neutro Abs 4.0 1.7 - 7.7 K/uL   Lymphocytes Relative 17 %   Lymphs Abs 1.0 0.7 - 4.0 K/uL   Monocytes Relative 11 %   Monocytes Absolute 0.7 0.1 - 1.0 K/uL   Eosinophils Relative 3 %   Eosinophils Absolute 0.2 0.0 - 0.5 K/uL   Basophils Relative 1 %   Basophils Absolute 0.0 0.0 - 0.1 K/uL   Immature Granulocytes 0 %   Abs Immature Granulocytes 0.01 0.00 - 0.07 K/uL    Comment: Performed at [facility] [address], [place], [place] [address]  Brain natriuretic peptide     Status: Abnormal   Collection Time:  06/23/18 10:40 AM  Result Value Ref Range   B Natriuretic Peptide >4,500.0 (H) 0.0 - 100.0 pg/mL    Comment: Performed at [facility] [address] [place], [place] [address]  Troponin I - Now Then Q6H     Status: None   Collection Time: 06/23/18 12:48 PM  Result Value Ref Range   Troponin I <0.03 <0.03 ng/mL    Comment: Performed at [facility] [address], [place], [place] [address]  CBC     Status: Abnormal   Collection Time: 06/23/18  1:35 PM  Result Value Ref  Range   WBC 5.8 4.0 - 10.5 K/uL   RBC 4.28 3.87 - 5.11 MIL/uL   Hemoglobin 12.3 12.0 - 15.0 g/dL   HCT 38.8 36.0 - 46.0 %   MCV 90.7 80.0 - 100.0 fL   MCH 28.7 26.0 - 34.0 pg   MCHC 31.7 30.0 - 36.0 g/dL   RDW 17.6 (H) 11.5 - 15.5 %   Platelets 220 150 - 400 K/uL   nRBC 0.0 0.0 - 0.2 %    Comment: Performed at [facility] [address] [place], [place] [address]  Creatinine, serum     Status: Abnormal   Collection Time: 06/23/18  1:35 PM  Result Value Ref Range   Creatinine, Ser 1.24 (H) 0.44 - 1.00 mg/dL   GFR calc non Af Amer 38 (L) >60 mL/min   GFR calc Af Amer 44 (L) >60 mL/min    Comment: Performed at [facility] [address] [place], [place] [address]  Troponin I - Now Then Q6H     Status: None   Collection Time: 06/23/18  6:35 PM  Result Value Ref Range   Troponin I <0.03 <0.03 ng/mL    Comment: Performed at [facility] [address] [place], [place] [address]  Troponin I - Now Then Q6H     Status: None   Collection Time: 06/24/18  2:56 AM  Result Value Ref Range   Troponin I <0.03 <0.03 ng/mL    Comment: Performed at [facility] [address], [place], [place] [address]  Basic metabolic panel     Status: Abnormal   Collection Time: 06/24/18  2:56 AM  Result Value Ref Range   Sodium 142 135 - 145 mmol/L   Potassium 4.0 3.5 - 5.1 mmol/L   Chloride 107 98 - 111 mmol/L   CO2 22 22 - 32 mmol/L   Glucose, Bld 116 (H) 70 - 99 mg/dL   BUN 24 (H) 8 -  23 mg/dL   Creatinine, Ser 1.25 (H) 0.44 - 1.00 mg/dL   Calcium 9.7 8.9 - 10.3 mg/dL   GFR calc non Af Amer 38 (L) >60 mL/min   GFR calc Af Amer 44 (L) >60 mL/min   Anion gap 13 5 - 15    Comment: Performed at [facility] [address] [place], [place] [address]    Dg Chest 2 View  Result Date: 06/23/2018 CLINICAL DATA:  Cough and congestion EXAM: CHEST - 2 VIEW COMPARISON:  Chest radiograph April 27, 2018 and chest CT May 05, 2018 FINDINGS: There is extensive fibrosis throughout the lungs bilaterally, essentially stable. Interstitium is diffusely prominent. No airspace consolidation. There is generalized cardiomegaly with slight pulmonary venous hypertension. No adenopathy. There is aortic atherosclerosis. There is arthropathy in each shoulder. IMPRESSION: Stable cardiomegaly with a degree of pulmonary vascular congestion. Generalized fibrosis. There may be mild chronic interstitial edema superimposed. No consolidation. There may well be a degree of chronic congestive heart failure superimposed on fibrosis. No new opacity evident. No consolidation. Aortic Atherosclerosis (ICD10-I70.0). Electronically Signed   By: Lowella Grip III M.D.   On: 06/23/2018 09:43    Review of Systems  Constitutional: Negative for chills and fever.  HENT: Negative for hearing loss.   Eyes: Negative for blurred vision.  Respiratory: Positive for shortness of breath.   Cardiovascular: Negative for chest pain, palpitations and orthopnea.  Gastrointestinal: Negative for nausea and vomiting.  Genitourinary: Negative for dysuria.  Neurological: Negative  for dizziness.   Blood pressure (!) 150/83, pulse (!) 106, temperature 97.6 F (36.4 C), temperature source Oral, resp. rate 18, height 5\' 1"  (1.549 m), weight 59.8 kg, SpO2 98 %. Physical Exam  Constitutional: She is oriented to person, place, and time.  HENT:  Head: Normocephalic and atraumatic.  Eyes: Pupils are equal, round, and reactive to  light. Conjunctivae are normal. Left eye exhibits no discharge.  Neck: Normal range of motion. No tracheal deviation present. No thyromegaly present.  Cardiovascular: Normal rate and regular rhythm.  Murmur (2/6 systolic murmur and S3 gallop noted) heard. Respiratory:  Decreased breath sounds at bases with basilar rales noted  GI: Soft. Bowel sounds are normal. She exhibits no distension. There is no abdominal tenderness. There is no rebound.  Musculoskeletal:        General: No tenderness, deformity or edema.  Neurological: She is alert and oriented to person, place, and time.    Assessment/Plan: Resolving acute on chronic systolic/diastolic congestive heart failure Hypertensive heart disease with known progressive worsening systolic function on top of diastolic dysfunction. Nonobstructive CAD in the past. Hyperlipidemia. Degenerative joint disease. Chronic kidney disease stage II. Anemia of chronic disease. Allergic rhinitis. History of strangulated small bowel partial obstruction, status post resection in the past. Plan Will DC amlodipine.  In view of non-depressed LV systolic function. Switch metoprolol to carvedilol as per orders. Agree with IV Lasix. Will increase ARB once fully compensated and if renal function remained stable Increase isosorbide mononitrate to 30 mg daily Charolette Forward 06/24/2018, 3:09 PM

## 2018-06-24 NOTE — Progress Notes (Signed)
   Subjective: No overnight events. Patient states she feels tired today and restless. She continues to not hav an appetite. She drinks vanilla ensures at home to help supplement, at least one a day. She states she did not sleep much. She has been urinating a lot. Reports her abdominal fullness feels somewhat less today. Discussed plan to continue diuretics and echo. She expressed understanding. All questions and concerns addressed.   Objective:  Vital signs in last 24 hours: Vitals:   06/23/18 1515 06/23/18 1931 06/24/18 0038 06/24/18 0427  BP:  (!) 151/77 (!) 167/89 (!) 160/87  Pulse:  77 85 73  Resp:  18 18 18   Temp:  97.6 F (36.4 C) 98.1 F (36.7 C) (!) 97.5 F (36.4 C)  TempSrc:  Oral Oral Oral  SpO2:  98% 97% 95%  Weight: 61.2 kg   59.8 kg  Height: 5\' 1"  (1.549 m)      Physical Exam Gen: seen sitting at the bedside eating breakfast, no distress CV: RRR, murmur appreciated Pulm: right sided basilar crackles greater than left, on RA saturating well, normal effort HENT: JVD  Ext: no edema, warm and well perfused   Assessment/Plan:  Active Problems:   Acute exacerbation of CHF (congestive heart failure) ([place])  [name] is a 85 year old female with HTN, HLD, interstitial lung disease, chronic diastolic heart failure and seasonal allergies presenting to the ED with worsening cough and fatigue.  Chest x-ray showed pulmonary vascular congestion and BNP was elevated greater than 4500.  Acute on Chronic Diastolic Heart Failure: Patient continues to have cough and fatigue. She was started on IV furosemide 40 mg yesterday and has had a good urinary response, ~1.5 L output. She is down 4 lbs today. Last clinic visit weight was 63.1 kg and she is 59.8 kg today. On exam, JVD and basilar crackles greater on the right. Plan is to continue diuresis and repeat echocardiogram today. It is possible her HF was exacerbated due to her uncontrolled HTN. Troponins negative, but will repeat  EKG due to new anterolateral T wave inversions on admission EKG.  - C/w imdur 30 mg, metoprolol 25 mg and spironolactone 50 mg, hold HCTZ - Increase IV furosemide 40 mg BID  - Cardiac monitoring  - Repeat EKG  - Echo - Daily weights  - Stricts I&Os  AKI: Cr 1.25, baseline below 1.0. Likely 2/2 to CHF exacerbation.  - Avoid nephrotoxic agents - Trend BMP  HTN: Home medications include amlodipine 10mg , HCTZ 25 mg, imdur 30 mg, losartan 50 mg, metoprolol 25 mg and spironolactone 50 mg. - C/w home medications - Restart losartan 50 mg today  - Hold HCTZ and will give IV furosemide for now    Dispo: Anticipated discharge pending clinical improvement.   Cashtyn Pouliot N, DO 06/24/2018, 7:05 AM

## 2018-06-24 NOTE — Progress Notes (Signed)
  Date: 06/24/2018  Patient name: Kristi Alexander  Medical record number: [id number]  Date of birth: 1925-03-16   I have seen and evaluated this patient and I have discussed the plan of care with the house staff. Please see their note for complete details. I concur with their findings with the following additions/corrections:   85 year old woman with a history of ILD, HFpEF, HTN, and HLD admitted for progressive cough and fatigue.  She notes a few months of progressive fatigue and weakness, abdominal fullness, and decreased appetite.  She also has had seasonal allergy symptoms and had a tele-visit with our clinic, but seems stable at the time.  Dr. Terrence Dupont recently increased her spironolactone from 25 to 50 mg and started hydrochlorothiazide 25 mg daily.  Vitals are significant for normal temperature and heart rate, intermittent tachypnea up to 29, and hypertension up to 169/108.  O2 sat normal on room air. On my exam, she was sitting up comfortably on the edge of the bed eating breakfast.  Cardiac exam was significant for normal rate, possible S3, and JVP to the angle of the mandible.  Lungs had bibasilar crackles but normal respiratory effort abdomen was distended but soft and nontender.  Minimal lower extremity edema, extremities warm and well-perfused.  Significant test results include: BNP >4500 Troponin negative x3 Creatinine 1.2 CBC normal  EKG with sinus rhythm, borderline first-degree AV block, LAE, inferior T wave flattening and lateral T wave inversions CXR with cardiomegaly and pulmonary congestion superimposed on chronic fibrosis TTE shows LVEF 20 to 25%, moderately decreased RV function with elevated RV pressures and moderate TR, all of which has progressed since her echo 05/05/2018  In summary, this is a 85 year old woman with a history of ILD and HFpEF admitted for acute on chronic CHF.  Unfortunately, echo shows HFrEF with biventricular failure.  EKG changes are concerning  for ischemia, but she has no chest pain and troponins are negative.  Progression of her heart failure may be related to ongoing hypertension - Continue diuresis with IV furosemide, good response so far - We have contacted her cardiologist Dr. Terrence Dupont - Already on losartan, metoprolol, and spironolactone, will likely need furosemide or torsemide at home after discharge  Lenice Pressman, M.D., Ph.D. 06/24/2018, 2:35 PM

## 2018-06-25 DIAGNOSIS — R011 Cardiac murmur, unspecified: Secondary | ICD-10-CM

## 2018-06-25 DIAGNOSIS — I2510 Atherosclerotic heart disease of native coronary artery without angina pectoris: Secondary | ICD-10-CM

## 2018-06-25 DIAGNOSIS — R0989 Other specified symptoms and signs involving the circulatory and respiratory systems: Secondary | ICD-10-CM

## 2018-06-25 LAB — BASIC METABOLIC PANEL
Anion gap: 13 (ref 5–15)
BUN: 23 mg/dL (ref 8–23)
CO2: 27 mmol/L (ref 22–32)
Calcium: 9.1 mg/dL (ref 8.9–10.3)
Chloride: 103 mmol/L (ref 98–111)
Creatinine, Ser: 1.21 mg/dL — ABNORMAL HIGH (ref 0.44–1.00)
GFR calc Af Amer: 45 mL/min — ABNORMAL LOW (ref >60)
GFR calc non Af Amer: 39 mL/min — ABNORMAL LOW (ref >60)
Glucose, Bld: 118 mg/dL — ABNORMAL HIGH (ref 70–99)
Potassium: 3.5 mmol/L (ref 3.5–5.1)
Sodium: 143 mmol/L (ref 135–145)

## 2018-06-25 MED ORDER — POTASSIUM CHLORIDE CRYS ER 20 MEQ PO TBCR
40.0000 meq | EXTENDED_RELEASE_TABLET | Freq: Once | ORAL | Status: AC
  Administered 2018-06-25: 40 meq via ORAL
  Filled 2018-06-25: qty 2

## 2018-06-25 MED ORDER — SACUBITRIL-VALSARTAN 49-51 MG PO TABS
1.0000 | ORAL_TABLET | Freq: Two times a day (BID) | ORAL | Status: DC
  Administered 2018-06-25 – 2018-06-26 (×3): 1 via ORAL
  Filled 2018-06-25 (×4): qty 1

## 2018-06-25 NOTE — TOC Benefit Eligibility Note (Signed)
Transition of Care [facility]) Benefit Eligibility Note    Patient Details  Name: Kristi Alexander MRN: [id number] Date of Birth: 02-03-1926   Medication/Dose: Delene Loll  49-51 MG BID  Covered?: Yes  Tier: 2 Drug  Prescription Coverage Preferred Pharmacy: YES([facility] AND [facility] M/[facility]  90 DAYS UPPLY FOR M/O   $80.00)  Spoke with Person/Company/Phone Number:: [name]([facility] # [phone])  Co-Pay: $ 35.70(Q/L TWO PILL PER DAY)  Prior Approval: No  Deductible: Met  Additional Notes: SACUBITRIL-VALSARTAN 160 MG  BID, COVER- YES, CO-PAY- $ 10.00, TIER- 1 DRUG, P/A- NO  90 DAY SUPPLY FOR M/O $24.00    Memory Argue Phone Number: 06/25/2018, 1:22 PM

## 2018-06-25 NOTE — Progress Notes (Addendum)
Subjective:  ALYZABETH PONTILLO is a 85 y.o. with PMH of HTN, HLD, CAD, diastolic heart failure admit for dyspnea on hospital day 1  Mrs.Esteban was examined and evaluated at bedside this AM. She was observed walking in her hospital room w/o fatigue or dyspnea. She states she has had significant symptomatic improvement since admission and has been urinating well. Denies any chest pain, palpitations, dyspnea. She states she spoke with her cardiologist yesterday and has clear understanding of her hypervolemia and need for IV diuresis. All other questions and concerns answered.  Objective:  Vital signs in last 24 hours: Vitals:   06/24/18 1320 06/24/18 1626 06/24/18 1940 06/25/18 0420  BP:  139/64 135/66 125/89  Pulse: (!) 106 76 64 70  Resp:  18 18 18   Temp:  (!) 97.5 F (36.4 C) 98.6 F (37 C) 98.4 F (36.9 C)  TempSrc:  Oral Oral Oral  SpO2:  94% 99% 97%  Weight:    58.2 kg  Height:       Physical Exam  Constitutional: She is oriented to person, place, and time and well-developed, well-nourished, and in no distress. No distress.  HENT:  Mouth/Throat: Oropharynx is clear and moist.  Eyes: Conjunctivae are normal.  Neck: Normal range of motion. Neck supple. JVD (up to mandible) present.  Cardiovascular: Normal rate, regular rhythm and intact distal pulses.  Murmur (systolic murmur loudest at LSB) heard. Pulmonary/Chest: Effort normal. She has rales (up to mid thorax).  Abdominal: Soft. Bowel sounds are normal. She exhibits no distension. There is no abdominal tenderness.  Musculoskeletal: Normal range of motion.        General: No edema.  Neurological: She is alert and oriented to person, place, and time.  Skin: She is not diaphoretic.   Assessment/Plan:  Principal Problem:   Acute exacerbation of CHF (congestive heart failure) ([place]) Active Problems:   Pulmonary hypertension (HCC)   Essential hypertension   Interstitial lung disease (HCC)  MISTEY HOFFERT is a 85 y.o.  with PMH of HTN, HLD, CAD, diastolic heart failure admit for dyspnea. Her TTE yesterday showed newly reduced systolic EF of 76-22%. Unclear if due to ischemic event or non-ischemic cardiomyopathy. Acuity of reduction in systolic function is concerning. Cardiology following and recommend d/c amlodipine and increase Imdur. She is continuing to diuresis well w/ improvement in renal function. Still appear hypervolemic on exam. Will require further inpatient admission for IV diuresis.  Acute systolic heart failure 2/2 ischemic event vs non-ischemic cardiomyopathy TTE w/ new 20-25% EF. Increased left ventricular wall thickness. Diastolic dysfunction. Reduced R systolic fx. Dilated IVC. Output 1.6L overnight. 59.8kg->58.2kg. O2 sat 99 on RA. - Appreciate cardiology recs: d/c amlodipine, metoprolol switch to carvedilol, imdur dose increased - C/w IV Furosemide 40mg  BID, carvedilol 6.25mg  BID - Trend BMP - Strict I&Os - Daily Weights - Fluid restriction - Keep O2 sat >88 - Replenish K as needed >4.0 - 5mEq K-dur today - C/w home meds: IMDUR 30mg  daily  Acute Kidney Injury 2/2 CHF exacerbation Improving w/ diuresis. Cr trend 1.25->1.2, baseline below 1.0 - Avoid nephrotoxic agents - Trend BMP  HTN AM bp 125/89, on losartan, metoprolol, HCTZ, and spironolactone at home - C/w IV lasix as above - Holding home HCTZ - C/w home med: losartan 50mg  daily, spironolactone 25mg  daily  DVT prophx: Lovenox Diet: Cardiac Code: Full  Dispo: Anticipated discharge in approximately 2-3 day(s) after reaching new dry weight  Mosetta Anis, MD 06/25/2018, 6:14 AM Pager: [phone]

## 2018-06-25 NOTE — TOC Transition Note (Signed)
Transition of Care [facility]) - CM/SW Discharge Note   Patient Details  Name: Kristi Alexander MRN: [id number] Date of Birth: 19-Nov-1925  [facility]) CM/SW Contact:  Sherrilyn Rist Phone Number: [phone] 06/25/2018, 12:28 PM   Clinical Narrative:    [place] arranged with [facility]) HHRN/ PT; benefit check in progress for Entresto.   Final next level of care: Home/Self Care Barriers to Discharge: No Barriers Identified   Patient Goals and CMS Choice Patient states their goals for this hospitalization and ongoing recovery are:: to stay at home CMS Medicare.gov Compare Post Acute Care list provided to:: Patient Choice offered to / list presented to : NA  Discharge Placement                       Discharge Plan and Services In-house Referral: NA Discharge Planning Services: CM Consult Post Acute Care Choice: NA          DME Arranged: N/A DME Agency: NA         HH Agency: NA        Social Determinants of Health (SDOH) Interventions     Readmission Risk Interventions No flowsheet data found.

## 2018-06-25 NOTE — Progress Notes (Signed)
Subjective:  Doing well.  Denies any chest pain or shortness of breath.  Objective:  Vital Signs in the last 24 hours: Temp:  [97.5 F (36.4 C)-98.7 F (37.1 C)] 98.7 F (37.1 C) (05/01 0840) Pulse Rate:  [64-106] 67 (05/01 0840) Resp:  [18-20] 18 (05/01 0840) BP: (125-152)/(64-89) 152/79 (05/01 0840) SpO2:  [94 %-100 %] 96 % (05/01 0840) Weight:  [58.2 kg] 58.2 kg (05/01 0420)  Intake/Output from previous day: 04/30 0701 - 05/01 0700 In: 0  Out: 1625 [Urine:1625] Intake/Output from this shift: No intake/output data recorded.  Physical Exam: Neck: no adenopathy, no carotid bruit, no JVD and supple, symmetrical, trachea midline Lungs: decreased breath sounds at right base.  Overall air entry has improved.  No Rales appreciated Heart: regular rate and rhythm, S1, S2 normal and 2/6 systolic murmur noted Abdomen: soft, non-tender; bowel sounds normal; no masses,  no organomegaly Extremities: extremities normal, atraumatic, no cyanosis or edema  Lab Results: Recent Labs    06/23/18 1040 06/23/18 1335  WBC 5.9 5.8  HGB 12.1 12.3  PLT 232 220   Recent Labs    06/24/18 0256 06/25/18 0354  NA 142 143  K 4.0 3.5  CL 107 103  CO2 22 27  GLUCOSE 116* 118*  BUN 24* 23  CREATININE 1.25* 1.21*   Recent Labs    06/23/18 1835 06/24/18 0256  TROPONINI <0.03 <0.03   Hepatic Function Panel No results for input(s): PROT, ALBUMIN, AST, ALT, ALKPHOS, BILITOT, BILIDIR, IBILI in the last 72 hours. No results for input(s): CHOL in the last 72 hours. No results for input(s): PROTIME in the last 72 hours.  Imaging: Imaging results have been reviewed and Dg Chest 2 View  Result Date: 06/23/2018 CLINICAL DATA:  Cough and congestion EXAM: CHEST - 2 VIEW COMPARISON:  Chest radiograph April 27, 2018 and chest CT May 05, 2018 FINDINGS: There is extensive fibrosis throughout the lungs bilaterally, essentially stable. Interstitium is diffusely prominent. No airspace consolidation. There  is generalized cardiomegaly with slight pulmonary venous hypertension. No adenopathy. There is aortic atherosclerosis. There is arthropathy in each shoulder. IMPRESSION: Stable cardiomegaly with a degree of pulmonary vascular congestion. Generalized fibrosis. There may be mild chronic interstitial edema superimposed. No consolidation. There may well be a degree of chronic congestive heart failure superimposed on fibrosis. No new opacity evident. No consolidation. Aortic Atherosclerosis (ICD10-I70.0). Electronically Signed   By: Lowella Grip III M.D.   On: 06/23/2018 09:43    Cardiac Studies:  Assessment/Plan:  Resolving acute on chronic systolic/diastolic congestive heart failure Hypertensive heart disease with known progressive worsening systolic function on top of diastolic dysfunction. Nonobstructive CAD in the past. Interstitial lung disease Hyperlipidemia. Degenerative joint disease. Chronic kidney disease stage II. Anemia of chronic disease. Allergic rhinitis. History of strangulated small bowel partial obstruction, status post resection in the past. Plan Change losartan to Entresto  to as per orders Okay to discharge from cardiac point of view. Follow-up with me in one to 2 weeks. Heart failure instructions. I will sign off.  Please call if needed  LOS: 1 day    Charolette Forward 06/25/2018, 9:18 AM

## 2018-06-26 DIAGNOSIS — Z888 Allergy status to other drugs, medicaments and biological substances status: Secondary | ICD-10-CM

## 2018-06-26 DIAGNOSIS — I5021 Acute systolic (congestive) heart failure: Secondary | ICD-10-CM

## 2018-06-26 DIAGNOSIS — Z880 Allergy status to penicillin: Secondary | ICD-10-CM

## 2018-06-26 LAB — BASIC METABOLIC PANEL
Anion gap: 10 (ref 5–15)
BUN: 22 mg/dL (ref 8–23)
CO2: 28 mmol/L (ref 22–32)
Calcium: 9 mg/dL (ref 8.9–10.3)
Chloride: 104 mmol/L (ref 98–111)
Creatinine, Ser: 1.21 mg/dL — ABNORMAL HIGH (ref 0.44–1.00)
GFR calc Af Amer: 45 mL/min — ABNORMAL LOW (ref >60)
GFR calc non Af Amer: 39 mL/min — ABNORMAL LOW (ref >60)
Glucose, Bld: 124 mg/dL — ABNORMAL HIGH (ref 70–99)
Potassium: 3.6 mmol/L (ref 3.5–5.1)
Sodium: 142 mmol/L (ref 135–145)

## 2018-06-26 LAB — MAGNESIUM: Magnesium: 1.5 mg/dL — ABNORMAL LOW (ref 1.7–2.4)

## 2018-06-26 MED ORDER — ISOSORBIDE MONONITRATE ER 30 MG PO TB24: 30.0000 mg | ORAL_TABLET | Freq: Every day | ORAL | 3 refills | Status: DC

## 2018-06-26 MED ORDER — MAGNESIUM SULFATE 2 GM/50ML IV SOLN: 2.0000 g | Freq: Once | INTRAVENOUS | Status: DC

## 2018-06-26 MED ORDER — CARVEDILOL 6.25 MG PO TABS: 6.2500 mg | ORAL_TABLET | Freq: Two times a day (BID) | ORAL | 0 refills | Status: DC

## 2018-06-26 MED ORDER — MAGNESIUM OXIDE 400 (241.3 MG) MG PO TABS
800.0000 mg | ORAL_TABLET | Freq: Once | ORAL | Status: AC
  Administered 2018-06-26: 800 mg via ORAL
  Filled 2018-06-26: qty 2

## 2018-06-26 MED ORDER — FUROSEMIDE 20 MG PO TABS: ORAL_TABLET | ORAL | 0 refills | Status: DC

## 2018-06-26 MED ORDER — MAGNESIUM SULFATE IN D5W 1-5 GM/100ML-% IV SOLN
1.0000 g | Freq: Once | INTRAVENOUS | Status: AC
  Administered 2018-06-26: 1 g via INTRAVENOUS
  Filled 2018-06-26: qty 100

## 2018-06-26 MED ORDER — SACUBITRIL-VALSARTAN 49-51 MG PO TABS: 1.0000 | ORAL_TABLET | Freq: Two times a day (BID) | ORAL | 0 refills | Status: DC

## 2018-06-26 NOTE — Progress Notes (Signed)
   Subjective: Breathing much better, no cp, orthopnea or PND, slept well last night.    Objective:  Vital signs in last 24 hours: Vitals:   06/25/18 2152 06/26/18 0518 06/26/18 0518 06/26/18 0849  BP: (!) 126/55 135/63 135/63 (!) 143/87  Pulse: 63 66 67 75  Resp: 16   16  Temp: 97.7 F (36.5 C) 98.2 F (36.8 C) 98.2 F (36.8 C) 98 F (36.7 C)  TempSrc: Oral Oral Oral Oral  SpO2: 93% 96% 91% 96%  Weight:   57.3 kg   Height:       Cardiac: JVP flat,  normal rate and rhythm, clear s1 and s2, 2/6 systolic murmur, no rubs or gallops Pulmonary: CTAB, not in distress Abdominal: non distended abdomen, soft and nontender Extremities: no LE edema Psych: Alert, conversant, in good spirits   Assessment/Plan:  Principal Problem:   Acute exacerbation of CHF (congestive heart failure) ([facility]) Active Problems:   Pulmonary hypertension (H[facility])   Essential hypertension   Interstitial lung disease (H[facility])  Acute systolic heart failure likely an ischemic cardiomyopathy - euvolemic, transition to oral lasix 20mg  daily on d/c with close follow up - C/w carvedilol 6.25mg  BID, entresto 49-51 BID, spiro 25, IMDUR 30   Acute Kidney Injury 2/2 CHF exacerbation Improving w/ diuresis. Crtrend 1.25->1.2,baseline below 1.0 - Avoid nephrotoxic agents - Trend BMP  Dispo: Anticipated discharge home today   Katherine Roan, MD 06/26/2018, 9:22 AM

## 2018-06-26 NOTE — Progress Notes (Signed)
  Date: 06/26/2018  Patient name: Kristi Alexander  Medical record number: [id number]  Date of birth: 1926/02/22   I have seen and evaluated this patient and I have discussed the plan of care with the house staff. Please see Dr. Maudie Flakes note for complete details. I concur with his findings and plan.    Plan for discharge today.  Patient is amenable.   Sid Falcon, MD 06/26/2018, 7:28 PM

## 2018-06-26 NOTE — Care Management (Signed)
Pt given Entresto card.  Information reviewed regarding home health.

## 2018-06-28 ENCOUNTER — Telehealth: Payer: Self-pay

## 2018-06-28 NOTE — Telephone Encounter (Signed)
Hospital TOC per Dr. Shan Levans, in person appt 07/01/2018 @ 10:00.

## 2018-06-29 NOTE — Telephone Encounter (Signed)
Transition Care Management Follow-up Telephone Call  Date of discharge and from where: 06/28/2018  How have you been since you were released from the hospital? Pt has no complaints at this time. Pt tracking her weight daily, performing all ADLs independently, lives with dtr-who helps with medications.    Any questions or concerns? No   Items Reviewed:  Did the pt receive and understand the discharge instructions provided? Yes   Medications obtained and verified? No States her medications and knows what to do   Any new allergies since your discharge? No   Dietary orders reviewed? Yes Do you have support at home? Yes  lives at home with dtr, but states she does everything for herself Functional Questionnaire: (I = Independent and D = Dependent) ADLs: I  Bathing/Dressing- I  Meal Prep- I  Eating- I  Maintaining continence- I  Transferring/Ambulation- Cane at times  Managing Meds- dtr helps with meds/pill box  Follow up appointments reviewed:   PCP Hospital f/u appt confirmed?  HFU appt scheduled for 07/01/18 at [facility] f/u appt confirmed? N/A.  Are transportation arrangements needed? NO  If their condition worsens, is the pt aware to call PCP or go to the Emergency Dept.? Yes  Was the patient provided with contact information for the PCP's office or ED? Yes  Was to pt encouraged to call back with questions or concerns? Yes

## 2018-07-01 ENCOUNTER — Other Ambulatory Visit: Payer: Self-pay

## 2018-07-01 ENCOUNTER — Ambulatory Visit (INDEPENDENT_AMBULATORY_CARE_PROVIDER_SITE_OTHER): Payer: Medicare Other | Admitting: Internal Medicine

## 2018-07-01 VITALS — BP 146/65 | HR 73 | Temp 98.1°F | Ht 62.0 in | Wt 131.0 lb

## 2018-07-01 DIAGNOSIS — I5043 Acute on chronic combined systolic (congestive) and diastolic (congestive) heart failure: Secondary | ICD-10-CM

## 2018-07-01 DIAGNOSIS — N179 Acute kidney failure, unspecified: Secondary | ICD-10-CM

## 2018-07-01 NOTE — Progress Notes (Signed)
   CC: follow up after hospitalization for HFrEF   HPI:  [name] is a 85 y.o. with PMH as listed below who presents for follow up after hospitalization for HFrEF. Please see the assessment and plans for the status of the patient chronic medical problems.   Past Medical History:  Diagnosis Date  . Abnormality of gait 11/22/2014  . Acute exacerbation of CHF (congestive heart failure) ([place]) 06/23/2018  . Arthritis   . Cyst of breast, right, benign solitary   . Dyslipidemia   . H/O: hysterectomy   . Heart murmur   . History of appendectomy   . History of lumbosacral spine surgery   . Hypertension   . Inguinal hernia   . Lumbar stenosis   . Restless leg syndrome    Review of Systems:  Refer to history of present illness and assessment and plans for pertinent review of systems, all others reviewed and negative  Physical Exam:  Vitals:   07/01/18 1029  BP: (!) 146/65  Pulse: 73  Temp: 98.1 F (36.7 C)  TempSrc: Oral  SpO2: 100%  Weight: 131 lb (59.4 kg)  Height: 5\' 2"  (1.575 m)   General: well appearing, not in acute distress  Cardiac: regular rate and rhythm, no murmurs rubs or gallops Pulm: normal work of breathing, no rhonchi  Extremities: no peripheral edema   Assessment & Plan:   HFrEF  [name] was hospitalized 4/29 - 5/2 for an acute exacerbation of HFrEF secondary to worsening hypertensive cardiomyopathy. During the hospitalization her EKG showed no acute ischemic changes however the ECHO showed a reduced EF of 25% down from 50-55% two months prior. Cardiology evaluated and did not plan for an ischemic evaluation. Her weight at discharge after diuresis with IV lasix was 57.3 kg. At discharge she was asked to stop taking amlodipine, HCTZ, and metoprolol succinate. She was started on lasix 20 mg daily with PRN dosing for weight gain and imdur was reduced to 30 mg daily ( down from 45 mg on admission). Spironolactone and losartan were continued.  [name]  has been feeling well since the time of discharge, her symptoms have resolved and she believes that her daughter who takes care of her was able to obtain all of her new medications. She does not have her medication bottles with her today but brings her AVS from the time of discharge. I called and spoke with daughter Kristi Alexander who confirmed they have everything they need and had no questions.   AKI  Creatinine on admission was 1.24 up from baseline 0.89 when last checked three months prior to admission.  - follow up BMP shows renal function is stable with the time of discharge five days ago   See Encounters Tab for problem based charting.  Patient discussed with Dr. Beryle Beams

## 2018-07-02 ENCOUNTER — Encounter: Payer: Self-pay | Admitting: Internal Medicine

## 2018-07-02 DIAGNOSIS — N179 Acute kidney failure, unspecified: Secondary | ICD-10-CM | POA: Insufficient documentation

## 2018-07-02 LAB — BMP8+ANION GAP
Anion Gap: 10 mmol/L (ref 10.0–18.0)
BUN/Creatinine Ratio: 20 (ref 12–28)
BUN: 25 mg/dL (ref 10–36)
CO2: 26 mmol/L (ref 20–29)
Calcium: 9.4 mg/dL (ref 8.7–10.3)
Chloride: 102 mmol/L (ref 96–106)
Creatinine, Ser: 1.23 mg/dL — ABNORMAL HIGH (ref 0.57–1.00)
GFR calc Af Amer: 44 mL/min/{1.73_m2} — ABNORMAL LOW (ref >59)
GFR calc non Af Amer: 38 mL/min/{1.73_m2} — ABNORMAL LOW (ref >59)
Glucose: 183 mg/dL — ABNORMAL HIGH (ref 65–99)
Potassium: 4.4 mmol/L (ref 3.5–5.2)
Sodium: 138 mmol/L (ref 134–144)

## 2018-07-02 NOTE — Assessment & Plan Note (Signed)
Kristi Alexander was hospitalized 4/29 - 5/2 for an acute exacerbation of HFrEF secondary to worsening hypertensive cardiomyopathy. During the hospitalization her EKG showed no acute ischemic changes however the ECHO showed a reduced EF of 25% down from 50-55% two months prior. Cardiology evaluated and did not plan for an ischemic evaluation. Her weight at discharge after diuresis with IV lasix was 57.3 kg. At discharge she was asked to stop taking amlodipine, HCTZ, and metoprolol succinate. She was started on lasix 20 mg daily with PRN dosing for weight gain and imdur was reduced to 30 mg daily ( down from 45 mg on admission). Spironolactone and losartan were continued.  Kristi Alexander has been feeling well since the time of discharge, her symptoms have resolved and she believes that her daughter who takes care of her was able to obtain all of her new medications. She does not have her medication bottles with her today but brings her AVS from the time of discharge. I called and spoke with daughter Kristi Alexander who confirmed they have everything they need and had no questions.

## 2018-07-02 NOTE — Assessment & Plan Note (Signed)
Creatinine on admission was 1.24 up from baseline 0.89 when last checked three months prior to admission.  - follow up BMP shows renal function is stable with the time of discharge five days ago

## 2018-07-05 NOTE — Progress Notes (Signed)
Medicine attending: Medical history, presenting problems, physical findings, and medications, reviewed with resident physician Dr Ledell Noss on the day of the patient visit and I concur with her evaluation and management plan. Follow up recent admission for acute on chronic systolic heart failure. Doing well at this time. Continue current meds.

## 2018-07-06 ENCOUNTER — Telehealth: Payer: Self-pay | Admitting: Pharmacy Technician

## 2018-07-06 NOTE — Telephone Encounter (Signed)
  Called patient to review medications (discharged from hospitalization 06/28/2018 with heart failure exacerbation with worsening EF). Was able to confirm medications and doses with patient's daughter, who helps with medication management for her mother. No refills needed and confirmed history of refills based on pharmacy dispense report in Epic. No issues with adherence noted by patient. Advised her to contact clinic pharmacist if she has any medication related concerns.  Brendolyn Patty, PharmD PGY1 Pharmacy Resident Phone [phone]  07/06/2018   11:41 AM

## 2018-07-06 NOTE — Telephone Encounter (Signed)
Opened in error

## 2018-07-17 ENCOUNTER — Other Ambulatory Visit: Payer: Self-pay | Admitting: Internal Medicine

## 2018-07-22 ENCOUNTER — Telehealth: Payer: Self-pay | Admitting: *Deleted

## 2018-07-22 ENCOUNTER — Other Ambulatory Visit (HOSPITAL_COMMUNITY): Payer: Self-pay | Admitting: Internal Medicine

## 2018-07-22 DIAGNOSIS — R053 Chronic cough: Secondary | ICD-10-CM

## 2018-07-22 DIAGNOSIS — R05 Cough: Secondary | ICD-10-CM

## 2018-07-22 MED ORDER — BENZONATATE 100 MG PO CAPS: 100.0000 mg | ORAL_CAPSULE | Freq: Three times a day (TID) | ORAL | 0 refills | Status: DC

## 2018-07-22 NOTE — Telephone Encounter (Signed)
Pt calls and states she cannot get rid of a cough she has had since her stay in hosp. She has no short of breath, no chest pain, no N&V, no fevers. "in fact I feel good, I just cough all the time" she ask if there is something she can take for this and not come in. Please advise

## 2018-07-22 NOTE — Telephone Encounter (Signed)
Informed pt, she cancelled mon appt and if not better by mon will call and reschedule

## 2018-07-22 NOTE — Telephone Encounter (Signed)
I can send in some tessalon perls to see if this helps. Will put in a prescription now. Thanks!

## 2018-07-26 ENCOUNTER — Ambulatory Visit: Payer: Medicare Other

## 2018-07-30 ENCOUNTER — Other Ambulatory Visit: Payer: Self-pay | Admitting: Internal Medicine

## 2018-08-06 ENCOUNTER — Other Ambulatory Visit: Payer: Self-pay | Admitting: Internal Medicine

## 2018-08-06 DIAGNOSIS — R05 Cough: Secondary | ICD-10-CM

## 2018-08-06 DIAGNOSIS — R053 Chronic cough: Secondary | ICD-10-CM

## 2018-08-13 NOTE — Addendum Note (Signed)
Addended by: Molli Hazard A on: 08/13/2018 06:23 AM   Modules accepted: Orders

## 2018-08-19 ENCOUNTER — Other Ambulatory Visit: Payer: Self-pay | Admitting: *Deleted

## 2018-08-19 MED ORDER — ENTRESTO 49-51 MG PO TABS: 1.0000 | ORAL_TABLET | Freq: Two times a day (BID) | ORAL | 5 refills | Status: DC

## 2018-08-19 MED ORDER — CARVEDILOL 6.25 MG PO TABS: 6.2500 mg | ORAL_TABLET | Freq: Two times a day (BID) | ORAL | 5 refills | Status: DC

## 2018-08-20 ENCOUNTER — Other Ambulatory Visit: Payer: Self-pay

## 2018-08-20 ENCOUNTER — Other Ambulatory Visit (INDEPENDENT_AMBULATORY_CARE_PROVIDER_SITE_OTHER): Payer: Medicare Other

## 2018-08-20 DIAGNOSIS — J849 Interstitial pulmonary disease, unspecified: Secondary | ICD-10-CM | POA: Diagnosis not present

## 2018-08-20 LAB — BRAIN NATRIURETIC PEPTIDE: B Natriuretic Peptide: 291.3 pg/mL — ABNORMAL HIGH (ref 0.0–100.0)

## 2018-08-21 LAB — ALKALINE PHOSPHATASE: Alkaline Phosphatase: 83 IU/L (ref 39–117)

## 2018-08-22 ENCOUNTER — Encounter: Payer: Self-pay | Admitting: *Deleted

## 2018-08-22 LAB — CYCLIC CITRUL PEPTIDE ANTIBODY, IGG/IGA: Cyclic Citrullin Peptide Ab: 11 units (ref 0–19)

## 2018-08-22 LAB — ANTINUCLEAR ANTIBODIES, IFA: ANA Titer 1: NEGATIVE

## 2018-08-22 LAB — ANTI-SCLERODERMA ANTIBODY: Scleroderma (Scl-70) (ENA) Antibody, IgG: <0.2 AI (ref 0.0–0.9)

## 2018-08-22 LAB — ANTI-JO 1 ANTIBODY, IGG: Anti JO-1: <0.2 AI (ref 0.0–0.9)

## 2018-08-22 LAB — HIV ANTIBODY (ROUTINE TESTING W REFLEX): HIV Screen 4th Generation wRfx: NONREACTIVE

## 2018-08-22 LAB — CENTROMERE ANTIBODIES: Centromere Ab Screen: <0.2 AI (ref 0.0–0.9)

## 2018-08-22 LAB — RHEUMATOID FACTOR: Rheumatoid fact SerPl-aCnc: 22.5 IU/mL — ABNORMAL HIGH (ref 0.0–13.9)

## 2018-08-30 ENCOUNTER — Other Ambulatory Visit: Payer: Self-pay | Admitting: Internal Medicine

## 2018-08-30 DIAGNOSIS — J849 Interstitial pulmonary disease, unspecified: Secondary | ICD-10-CM

## 2018-08-30 NOTE — Progress Notes (Signed)
Discussed findings with patient and will refer to pulmonology with recent CT showing reticulation and new onset HF.

## 2018-09-18 ENCOUNTER — Other Ambulatory Visit: Payer: Self-pay | Admitting: Internal Medicine

## 2018-09-18 DIAGNOSIS — I10 Essential (primary) hypertension: Secondary | ICD-10-CM

## 2018-09-20 NOTE — Telephone Encounter (Signed)
Call made to patient to confirm dose... she states that she takes spironolactone 50mg  1/2 tab daily.  Will send request to pcp for review.Regenia Skeeter, Aminah Zabawa Cassady7/27/20209:05 AM

## 2018-09-23 ENCOUNTER — Ambulatory Visit (INDEPENDENT_AMBULATORY_CARE_PROVIDER_SITE_OTHER): Payer: Medicare Other

## 2018-09-23 ENCOUNTER — Encounter: Payer: Self-pay | Admitting: Podiatry

## 2018-09-23 ENCOUNTER — Ambulatory Visit: Payer: Medicare Other | Admitting: Podiatry

## 2018-09-23 ENCOUNTER — Other Ambulatory Visit: Payer: Self-pay

## 2018-09-23 VITALS — Temp 97.8°F

## 2018-09-23 DIAGNOSIS — M76821 Posterior tibial tendinitis, right leg: Secondary | ICD-10-CM

## 2018-09-23 DIAGNOSIS — M19072 Primary osteoarthritis, left ankle and foot: Secondary | ICD-10-CM

## 2018-09-23 DIAGNOSIS — M76822 Posterior tibial tendinitis, left leg: Secondary | ICD-10-CM

## 2018-09-23 DIAGNOSIS — M19071 Primary osteoarthritis, right ankle and foot: Secondary | ICD-10-CM

## 2018-09-23 DIAGNOSIS — M19079 Primary osteoarthritis, unspecified ankle and foot: Secondary | ICD-10-CM

## 2018-09-23 MED ORDER — MELOXICAM 15 MG PO TABS: 15.0000 mg | ORAL_TABLET | Freq: Every day | ORAL | 3 refills | Status: DC

## 2018-09-23 NOTE — Progress Notes (Signed)
Subjective:  Patient ID: Kristi Alexander, female    DOB: 08-22-1925,  MRN: [id number] HPI Chief Complaint  Patient presents with  . Foot Pain    Patient states her feet hurt all over when she walks    85 y.o. female presents with the above complaint.   ROS: Denies fever chills nausea vomiting muscle aches pains calf pain back pain chest pain shortness of breath.  Presents with her daughter today.  Past Medical History:  Diagnosis Date  . Abnormality of gait 11/22/2014  . Acute exacerbation of CHF (congestive heart failure) ([facility]) 06/23/2018  . Arthritis   . Cyst of breast, right, benign solitary   . Dyslipidemia   . H/O: hysterectomy   . Heart murmur   . History of appendectomy   . History of lumbosacral spine surgery   . Hypertension   . Inguinal hernia   . Lumbar stenosis   . Restless leg syndrome    Past Surgical History:  Procedure Laterality Date  . ABDOMINAL HYSTERECTOMY    . APPENDECTOMY    . BOWEL RESECTION N/A 03/23/2013   Procedure: SMALL BOWEL RESECTION;  Surgeon: Gwenyth Ober, MD;  Location: [facility];  Service: General;  Laterality: N/A;  . BREAST CYST EXCISION     LEFT  . ESOPHAGOGASTRODUODENOSCOPY N/A 04/01/2013   Procedure: ESOPHAGOGASTRODUODENOSCOPY (EGD);  Surgeon: Gwenyth Ober, MD;  Location: [facility];  Service: General;  Laterality: N/A;  . HERNIA REPAIR    . LAPAROTOMY N/A 03/23/2013   Procedure: EXPLORATORY LAPAROTOMY;  Surgeon: Gwenyth Ober, MD;  Location: [facility];  Service: General;  Laterality: N/A;  . LUMBAR LAMINECTOMY/DECOMPRESSION MICRODISCECTOMY N/A 11/10/2016   Procedure: L4-5 DECOMPRESSION FOR RECURRENT STENOSIS;  Surgeon: Marybelle Killings, MD;  Location: [facility];  Service: Orthopedics;  Laterality: N/A;  . PEG PLACEMENT N/A 04/01/2013   Procedure: PERCUTANEOUS ENDOSCOPIC GASTROSTOMY (PEG) PLACEMENT;  Surgeon: Gwenyth Ober, MD;  Location: [facility];  Service: General;  Laterality: N/A;  . PEG TUBE REMOVAL    . TRACHEOSTOMY     feinstein  .  TRACHEOSTOMY CLOSURE      Current Outpatient Medications:  .  acetaminophen (TYLENOL) 500 MG tablet, Take 500 mg every 6 (six) hours as needed by mouth (pain)., Disp: , Rfl:  .  atorvastatin (LIPITOR) 20 MG tablet, Take 20 mg by mouth daily at 6 PM. , Disp: , Rfl:  .  benzonatate (TESSALON) 100 MG capsule, TAKE 1 CAPSULE BY MOUTH EVERY 8 HOURS, Disp: 21 capsule, Rfl: 0 .  Calcium Carb-Cholecalciferol (CALCIUM-VITAMIN D) 600-400 MG-UNIT TABS, Take 1 tablet by mouth once daily, Disp: 60 tablet, Rfl: 0 .  carvedilol (COREG) 6.25 MG tablet, Take 1 tablet (6.25 mg total) by mouth 2 (two) times daily with a meal., Disp: 60 tablet, Rfl: 5 .  cetirizine (ZYRTEC) 10 MG tablet, Take 10 mg daily as needed by mouth for allergies., Disp: , Rfl:  .  diclofenac sodium (VOLTAREN) 1 % GEL, Apply 2 g topically 4 (four) times daily. (Patient taking differently: Apply 2 g topically 4 (four) times daily as needed (PAIN). ), Disp: 1 Tube, Rfl: 2 .  ferrous sulfate 325 (65 FE) MG tablet, Take 325 mg daily with breakfast by mouth., Disp: , Rfl:  .  fluticasone (FLONASE) 50 MCG/ACT nasal spray, Place 1 spray daily as needed into both nostrils for allergies or rhinitis., Disp: , Rfl:  .  furosemide (LASIX) 20 MG tablet, Take one tablet daily.  If you gain 3lbs in  one day or 5lbs in a week take an extra tablet and call your doctor, Disp: 30 tablet, Rfl: 0 .  hydrOXYzine (ATARAX/VISTARIL) 25 MG tablet, TAKE 1 TABLET BY MOUTH ONCE DAILY AS NEEDED (Patient taking differently: Take 25 mg by mouth daily as needed for itching. ), Disp: 90 tablet, Rfl: 2 .  isosorbide mononitrate (IMDUR) 30 MG 24 hr tablet, Take 1 tablet (30 mg total) by mouth daily., Disp: 135 tablet, Rfl: 3 .  meloxicam (MOBIC) 15 MG tablet, Take 1 tablet (15 mg total) by mouth daily., Disp: 30 tablet, Rfl: 3 .  Multiple Vitamin (MULTIVITAMIN WITH MINERALS) TABS tablet, Take 1 tablet by mouth daily., Disp: , Rfl:  .  sacubitril-valsartan (ENTRESTO) 49-51 MG, Take  1 tablet by mouth 2 (two) times daily., Disp: 60 tablet, Rfl: 5 .  spironolactone (ALDACTONE) 50 MG tablet, Take 0.5 tablets (25 mg total) by mouth daily., Disp: 90 tablet, Rfl: 0 .  Vitamin D, Ergocalciferol, (DRISDOL) 50000 units CAPS capsule, Take 1 capsule (50,000 Units total) by mouth every 7 (seven) days., Disp: 30 capsule, Rfl: 3  Allergies  Allergen Reactions  . Penicillins Swelling    Has patient had a PCN reaction causing immediate rash, facial/tongue/throat swelling, SOB or lightheadedness with hypotension: Yes Has patient had a PCN reaction causing severe rash involving mucus membranes or skin necrosis: No Has patient had a PCN reaction that required hospitalization: No Has patient had a PCN reaction occurring within the last 10 years: No If all of the above answers are "NO", then may proceed with Cephalosporin use.   . Pravastatin Other (See Comments)    Cramps    Review of Systems Objective:   Vitals:   09/23/18 1008  Temp: 97.8 F (36.6 C)    General: Well developed, nourished, in no acute distress, alert and oriented x3   Dermatological: Skin is warm, dry and supple bilateral. Nails x 10 are well maintained; remaining integument appears unremarkable at this time. There are no open sores, no preulcerative lesions, no rash or signs of infection present.  Vascular: Dorsalis Pedis artery and Posterior Tibial artery pedal pulses are 2/4 bilateral with immedate capillary fill time. Pedal hair growth present. No varicosities and no lower extremity edema present bilateral.   Neruologic: Grossly intact via light touch bilateral. Vibratory intact via tuning fork bilateral. Protective threshold with Semmes Wienstein monofilament intact to all pedal sites bilateral. Patellar and Achilles deep tendon reflexes 2+ bilateral. No Babinski or clonus noted bilateral.   Musculoskeletal: No gross boney pedal deformities bilateral. No pain, crepitus, or limitation noted with foot and ankle  range of motion bilateral. Muscular strength 5/5 in all groups tested bilateral.  Pes planus bilateral frontal plane flexibility is present sagittal plane flexibility is limited particularly in the midfoot.  Osteoarthritic spurring is noted dorsally and plantarly.  Gait: Unassisted, Nonantalgic.    Radiographs:  Radiographs taken today demonstrate pes planus an osseously mature individual with significant osteoarthritic changes of the midfoot.  Mild osteopenia is noted.  No fractures identified.  Assessment & Plan:   Assessment: Osteoarthritis of the midfoot pain is more plantar than dorsal.  Plan: After sterile Betadine skin prep I injected 20 mg Kenalog 5 mg of Marcaine to the plantar aspect of each foot entering the foot medially and depositing the medication around the plantar aspect of the TMT joints.  Started her on meloxicam 15 mg 1 p.o. daily follow-up with her as needed.  Recommended that she wear tennis shoes in the  house.      T. [place], [place]

## 2018-09-24 ENCOUNTER — Encounter: Payer: Self-pay | Admitting: Pulmonary Disease

## 2018-09-24 ENCOUNTER — Ambulatory Visit: Payer: Medicare Other | Admitting: Pulmonary Disease

## 2018-09-24 ENCOUNTER — Other Ambulatory Visit: Payer: Self-pay

## 2018-09-24 VITALS — BP 120/70 | HR 70 | Temp 98.3°F | Ht 60.0 in | Wt 128.8 lb

## 2018-09-24 DIAGNOSIS — R768 Other specified abnormal immunological findings in serum: Secondary | ICD-10-CM

## 2018-09-24 DIAGNOSIS — J849 Interstitial pulmonary disease, unspecified: Secondary | ICD-10-CM | POA: Diagnosis not present

## 2018-09-24 NOTE — Addendum Note (Signed)
Addended by: Hildred Alamin I on: 09/24/2018 12:24 PM   Modules accepted: Orders

## 2018-09-24 NOTE — Progress Notes (Addendum)
Kristi Alexander    [id number]    17-Aug-1925  Primary Care Physician:[place], Nicholes Calamity, DO  Referring Physician: Aldine Contes, MD [address] [place],  [place] [id number]  Chief complaint: Consult for interstitial lung disease  HPI: 85 year old with cardiomyopathy, heart failure, arthritis Hospitalized in April for heart failure with reduced ejection fraction.  Evaluated by cardiology and she was not a candidate for heart catheterization.  She is being managed medically.   Referred for evaluation of CT scan which showed pulmonary fibrosis.  She had some serologies sent for connective tissue disease recently which showed elevated rheumatoid factor  States that she has chronic dyspnea on exertion which is unchanged over several years.  Denies any cough, sputum production, fevers, chills.  She has diffuse arthritis which is attributed to osteoarthritis.  + morning stiffness.  No dry mouth, dry eyes, difficulty swallowing.   Pets: No pets Occupation: Used to work as a Network engineer in the past Exposures: No known exposures, no mold, hot tub, Jacuzzi, asbestos ILD questionnaire 09/24/2018-negative Smoking history: 10-pack-year smoker.  Quit in 1975 Travel history: Previously lived in [place] Relevant family history: Daughter was told that she has lung disease with honeycombing  Outpatient Encounter Medications as of 09/24/2018  Medication Sig  . acetaminophen (TYLENOL) 500 MG tablet Take 500 mg every 6 (six) hours as needed by mouth (pain).  Marland Kitchen atorvastatin (LIPITOR) 20 MG tablet Take 20 mg by mouth daily at 6 PM.   . benzonatate (TESSALON) 100 MG capsule TAKE 1 CAPSULE BY MOUTH EVERY 8 HOURS  . Calcium Carb-Cholecalciferol (CALCIUM-VITAMIN D) 600-400 MG-UNIT TABS Take 1 tablet by mouth once daily  . carvedilol (COREG) 6.25 MG tablet Take 1 tablet (6.25 mg total) by mouth 2 (two) times daily with a meal.  . cetirizine (ZYRTEC) 10 MG tablet Take 10 mg  daily as needed by mouth for allergies.  Marland Kitchen diclofenac sodium (VOLTAREN) 1 % GEL Apply 2 g topically 4 (four) times daily. (Patient taking differently: Apply 2 g topically 4 (four) times daily as needed (PAIN). )  . ferrous sulfate 325 (65 FE) MG tablet Take 325 mg daily with breakfast by mouth.  . fluticasone (FLONASE) 50 MCG/ACT nasal spray Place 1 spray daily as needed into both nostrils for allergies or rhinitis.  . furosemide (LASIX) 20 MG tablet Take one tablet daily.  If you gain 3lbs in one day or 5lbs in a week take an extra tablet and call your doctor  . hydrOXYzine (ATARAX/VISTARIL) 25 MG tablet TAKE 1 TABLET BY MOUTH ONCE DAILY AS NEEDED (Patient taking differently: Take 25 mg by mouth daily as needed for itching. )  . isosorbide mononitrate (IMDUR) 30 MG 24 hr tablet Take 1 tablet (30 mg total) by mouth daily.  . meloxicam (MOBIC) 15 MG tablet Take 1 tablet (15 mg total) by mouth daily.  . Multiple Vitamin (MULTIVITAMIN WITH MINERALS) TABS tablet Take 1 tablet by mouth daily.  . sacubitril-valsartan (ENTRESTO) 49-51 MG Take 1 tablet by mouth 2 (two) times daily.  Marland Kitchen spironolactone (ALDACTONE) 50 MG tablet Take 0.5 tablets (25 mg total) by mouth daily.  . Vitamin D, Ergocalciferol, (DRISDOL) 50000 units CAPS capsule Take 1 capsule (50,000 Units total) by mouth every 7 (seven) days.   No facility-administered encounter medications on file as of 09/24/2018.     Allergies as of 09/24/2018 - Review Complete 09/24/2018  Allergen Reaction Noted  . Penicillins Swelling 07/13/2012  .  Pravastatin Other (See Comments) 11/03/2016    Past Medical History:  Diagnosis Date  . Abnormality of gait 11/22/2014  . Acute exacerbation of CHF (congestive heart failure) ([facility]) 06/23/2018  . Arthritis   . Cyst of breast, right, benign solitary   . Dyslipidemia   . H/O: hysterectomy   . Heart murmur   . History of appendectomy   . History of lumbosacral spine surgery   . Hypertension   . Inguinal  hernia   . Lumbar stenosis   . Restless leg syndrome     Past Surgical History:  Procedure Laterality Date  . ABDOMINAL HYSTERECTOMY    . APPENDECTOMY    . BOWEL RESECTION N/A 03/23/2013   Procedure: SMALL BOWEL RESECTION;  Surgeon: Gwenyth Ober, MD;  Location: [facility];  Service: General;  Laterality: N/A;  . BREAST CYST EXCISION     LEFT  . ESOPHAGOGASTRODUODENOSCOPY N/A 04/01/2013   Procedure: ESOPHAGOGASTRODUODENOSCOPY (EGD);  Surgeon: Gwenyth Ober, MD;  Location: [facility];  Service: General;  Laterality: N/A;  . HERNIA REPAIR    . LAPAROTOMY N/A 03/23/2013   Procedure: EXPLORATORY LAPAROTOMY;  Surgeon: Gwenyth Ober, MD;  Location: [facility];  Service: General;  Laterality: N/A;  . LUMBAR LAMINECTOMY/DECOMPRESSION MICRODISCECTOMY N/A 11/10/2016   Procedure: L4-5 DECOMPRESSION FOR RECURRENT STENOSIS;  Surgeon: Marybelle Killings, MD;  Location: [facility];  Service: Orthopedics;  Laterality: N/A;  . PEG PLACEMENT N/A 04/01/2013   Procedure: PERCUTANEOUS ENDOSCOPIC GASTROSTOMY (PEG) PLACEMENT;  Surgeon: Gwenyth Ober, MD;  Location: [facility];  Service: General;  Laterality: N/A;  . PEG TUBE REMOVAL    . TRACHEOSTOMY     feinstein  . TRACHEOSTOMY CLOSURE      Family History  Problem Relation Age of Onset  . Heart disease Mother   . Diabetes Mother   . Heart attack Father   . Lung cancer Brother   . Healthy Sister   . Stomach cancer Son     Social History   Socioeconomic History  . Marital status: Married    Spouse name: [name]   . Number of children: 2  . Years of education: 12+  . Highest education level: Not on file  Occupational History  . Occupation: retired  Scientific laboratory technician  . Financial resource strain: Not on file  . Food insecurity    Worry: Not on file    Inability: Not on file  . Transportation needs    Medical: Not on file    Non-medical: Not on file  Tobacco Use  . Smoking status: Former Smoker    Packs/day: 0.25    Years: 5.00    Pack years: 1.25    Types:  Cigarettes    Quit date: 02/25/2003    Years since quitting: 15.5  . Smokeless tobacco: Never Used  Substance and Sexual Activity  . Alcohol use: Yes    Comment: About 2 drinks per night  . Drug use: No  . Sexual activity: Not on file  Lifestyle  . Physical activity    Days per week: Not on file    Minutes per session: Not on file  . Stress: Not on file  Relationships  . Social Herbalist on phone: Not on file    Gets together: Not on file    Attends religious service: Not on file    Active member of club or organization: Not on file    Attends meetings of clubs or organizations: Not on file  Relationship status: Not on file  . Intimate partner violence    Fear of current or ex partner: Not on file    Emotionally abused: Not on file    Physically abused: Not on file    Forced sexual activity: Not on file  Other Topics Concern  . Not on file  Social History Narrative   Patient is widowed and lives with her daugther. The patient is retired. Patient has 2 children and has a college education.    Patient is right handed.   Patient drinks very little caffeine.    Review of systems: Review of Systems  Constitutional: Negative for fever and chills.  HENT: Negative.   Eyes: Negative for blurred vision.  Respiratory: as per HPI  Cardiovascular: Negative for chest pain and palpitations.  Gastrointestinal: Negative for vomiting, diarrhea, blood per rectum. Genitourinary: Negative for dysuria, urgency, frequency and hematuria.  Musculoskeletal: Negative for myalgias, back pain and joint pain.  Skin: Negative for itching and rash.  Neurological: Negative for dizziness, tremors, focal weakness, seizures and loss of consciousness.  Endo/Heme/Allergies: Negative for environmental allergies.  Psychiatric/Behavioral: Negative for depression, suicidal ideas and hallucinations.  All other systems reviewed and are negative.  Physical Exam: Blood pressure 120/70, pulse 70,  temperature 98.3 F (36.8 C), temperature source Oral, height 5' (1.524 m), weight 128 lb 12.8 oz (58.4 kg), SpO2 98 %. Gen:      No acute distress HEENT:  EOMI, sclera anicteric Neck:     No masses; no thyromegaly Lungs:    Clear to auscultation bilaterally; normal respiratory effort CV:         Regular rate and rhythm; no murmurs Abd:      + bowel sounds; soft, non-tender; no palpable masses, no distension Ext:    No edema; adequate peripheral perfusion Skin:      Warm and dry; no rash Neuro: alert and oriented x 3 Psych: normal mood and affect  Data Reviewed: Imaging: CT abdomen 01/10/2017- visualized lung bases show fibrosis, reticulation with mild honeycombing  CT chest 05/05/2018- centrilobular emphysema.  Subpleural reticulation, subpleural cystic changes.  Honeycombing in the left and right upper lobe I have reviewed images personally.  PFTs: 02/01/2015 FVC 2.88 [192%], FEV1 1.81 [155%], F/F 63, TLC 3.89 [81%], DLCO 12.58 [58%] Minimal obstruction.  Moderate diffusion defect.  No restriction  Labs: CTD 6/26- ANA, Jo 1, centromere, CCP, SCL 70-negative Rheumatoid factor 22.5  Assessment:  Interstitial lung disease CT reviewed with fibrosis, honeycombing.  There is no basal gradient and appears to be more prominent in the upper lobes.  This is indeterminate for UIP.  Findings appear to have progressed compared to CT abdomen in 2018 in 2015  Could be IPF especially given history of fibrosis in the family We will repeat CTD serologies including complete myositis panel.  Given history of arthritis and elevated rheumatoid factor we will get a rheumatology evaluation Repeat pulmonary function testing high-resolution CT  If there is progression then we can consider treatment with anti-fibrotics empirically and would avoid lung biopsy. Discussed this possibility with patient and her daughter in office today.  Given her age and frailty we will need to carefully consider risk benefits  before initiating treatment.  More then 1/2 the time of the 40 min visit was spent in counseling and/or coordination of care with the patient and family.  Plan/Recommendations: - Extended serologies for connective tissue disease - Rheumatology evaluation - Repeat PFTs, high-resolution CT  Marshell Garfinkel MD [facility] 09/24/2018, 10:38 AM  CC: Aldine Contes, MD  Addendum: Received note from Dr. Kathlene November, Rheumatology,[facility] dated 09/30/2018 Evaluated for positive rheumatoid factor, ANA. Clinically she does not have any synovitis to suggest inflammatory arthritis.  Low suspicion for active rheumatoid arthritis or active lupus Checking Smith ab, double-stranded DNA, complement levels Advised joint exercises, Tylenol, topical creams.  Marshell Garfinkel MD [facility] 10/06/2018, 3:04 PM

## 2018-09-24 NOTE — Patient Instructions (Signed)
We will check some labs today to evaluate the reason for pulmonary fibrosis We will schedule you for pulmonary function test Refer you to rheumatology for evaluation of elevated rheumatoid factor High-resolution CT in 2 months Follow-up in clinic after high-resolution CT

## 2018-09-27 LAB — ANA,IFA RA DIAG PNL W/RFLX TIT/PATN
Anti Nuclear Antibody (ANA): POSITIVE — AB
Cyclic Citrullin Peptide Ab: <16 UNITS
Rhuematoid fact SerPl-aCnc: 26 IU/mL — ABNORMAL HIGH (ref <14)

## 2018-09-27 LAB — ANTI-NUCLEAR AB-TITER (ANA TITER): ANA Titer 1: 1:80 {titer} — ABNORMAL HIGH

## 2018-09-27 LAB — SJOGREN'S SYNDROME ANTIBODS(SSA + SSB)
SSA (Ro) (ENA) Antibody, IgG: <1 AI
SSB (La) (ENA) Antibody, IgG: <1 AI

## 2018-09-27 LAB — ANCA SCREEN W REFLEX TITER: ANCA Screen: NEGATIVE

## 2018-09-27 LAB — ANTI-SCLERODERMA ANTIBODY: Scleroderma (Scl-70) (ENA) Antibody, IgG: <1 AI

## 2018-09-29 ENCOUNTER — Other Ambulatory Visit: Payer: Self-pay | Admitting: *Deleted

## 2018-09-29 MED ORDER — CALCIUM-VITAMIN D 600-400 MG-UNIT PO TABS: 1.0000 | ORAL_TABLET | Freq: Every day | ORAL | 0 refills | Status: DC

## 2018-10-15 LAB — MYOSITIS PANEL III: RNP: 2.9 EU/ml

## 2018-10-15 LAB — HYPERSENSITIVITY PNEUMONITIS
A. Pullulans Abs: NEGATIVE
A.Fumigatus #1 Abs: NEGATIVE
Micropolyspora faeni, IgG: NEGATIVE
Pigeon Serum Abs: NEGATIVE
Thermoact. Saccharii: NEGATIVE
Thermoactinomyces vulgaris, IgG: NEGATIVE

## 2018-11-08 ENCOUNTER — Other Ambulatory Visit: Payer: Self-pay | Admitting: Internal Medicine

## 2018-11-08 ENCOUNTER — Ambulatory Visit: Payer: Self-pay | Admitting: Surgery

## 2018-11-25 ENCOUNTER — Other Ambulatory Visit: Payer: Self-pay

## 2018-11-25 ENCOUNTER — Ambulatory Visit (INDEPENDENT_AMBULATORY_CARE_PROVIDER_SITE_OTHER)
Admission: RE | Admit: 2018-11-25 | Discharge: 2018-11-25 | Disposition: A | Discharge status: Home / Self Care | Payer: Medicare Other | Source: Ambulatory Visit | Attending: Pulmonary Disease | Admitting: Pulmonary Disease

## 2018-11-25 DIAGNOSIS — J849 Interstitial pulmonary disease, unspecified: Secondary | ICD-10-CM

## 2018-12-01 ENCOUNTER — Telehealth: Payer: Self-pay

## 2018-12-01 DIAGNOSIS — J849 Interstitial pulmonary disease, unspecified: Secondary | ICD-10-CM

## 2018-12-01 NOTE — Telephone Encounter (Signed)
Patient has been scheduled for a PFT for 12/30/18 at 9:30. She will need to be set up for Covid testing.

## 2018-12-01 NOTE — Telephone Encounter (Signed)
-----   Message from Marshell Garfinkel, MD sent at 12/01/2018 11:23 AM EDT ----- CT scan reconfirm pulmonary fibrosis Make follow-up return visit to discuss and plan for next steps  Ensure that PFTs are scheduled.

## 2018-12-02 ENCOUNTER — Other Ambulatory Visit: Payer: Self-pay | Admitting: Pulmonary Disease

## 2018-12-02 NOTE — Telephone Encounter (Signed)
Called and spoke to the patient. Scheduled patient for pre-procedure covid testing. Nothing further needed.

## 2018-12-07 ENCOUNTER — Other Ambulatory Visit: Payer: Self-pay | Admitting: Internal Medicine

## 2018-12-27 ENCOUNTER — Other Ambulatory Visit (HOSPITAL_COMMUNITY)
Admission: RE | Admit: 2018-12-27 | Discharge: 2018-12-27 | Disposition: A | Discharge status: Home / Self Care | Payer: Medicare Other | Source: Ambulatory Visit | Attending: Pulmonary Disease | Admitting: Pulmonary Disease

## 2018-12-27 DIAGNOSIS — Z01812 Encounter for preprocedural laboratory examination: Secondary | ICD-10-CM | POA: Diagnosis present

## 2018-12-27 DIAGNOSIS — Z20828 Contact with and (suspected) exposure to other viral communicable diseases: Secondary | ICD-10-CM | POA: Diagnosis not present

## 2018-12-28 LAB — NOVEL CORONAVIRUS, NAA (HOSP ORDER, SEND-OUT TO REF LAB; TAT 18-24 HRS): SARS-CoV-2, NAA: NOT DETECTED

## 2018-12-30 ENCOUNTER — Ambulatory Visit (INDEPENDENT_AMBULATORY_CARE_PROVIDER_SITE_OTHER): Payer: Medicare Other | Admitting: Pulmonary Disease

## 2018-12-30 ENCOUNTER — Ambulatory Visit: Payer: Medicare Other | Admitting: Pulmonary Disease

## 2018-12-30 ENCOUNTER — Encounter: Payer: Self-pay | Admitting: Pulmonary Disease

## 2018-12-30 ENCOUNTER — Other Ambulatory Visit: Payer: Self-pay

## 2018-12-30 VITALS — BP 136/66 | HR 61 | Ht 61.0 in | Wt 126.0 lb

## 2018-12-30 DIAGNOSIS — J849 Interstitial pulmonary disease, unspecified: Secondary | ICD-10-CM | POA: Diagnosis not present

## 2018-12-30 DIAGNOSIS — Z23 Encounter for immunization: Secondary | ICD-10-CM | POA: Diagnosis not present

## 2018-12-30 LAB — PULMONARY FUNCTION TEST
DL/VA: 3.38 ml/min/mmHg/L
DLCO unc: 10.1 ml/min/mmHg
FEF 25-75 Pre: 1.63 L/sec
FEF2575-%Pred-Pre: 236 %
FEV1-%Pred-Pre: 182 %
FEV1-Pre: 1.78 L
FEV1FVC-%Pred-Pre: 114 %
FEV6-%Pred-Pre: 179 %
FEV6-Pre: 2.12 L
FEV6FVC-%Pred-Pre: 106 %
FVC-%Pred-Pre: 169 %
FVC-Pre: 2.12 L
Pre FEV1/FVC ratio: 84 %
Pre FEV6/FVC Ratio: 100 %

## 2018-12-30 MED ORDER — BENZONATATE 100 MG PO CAPS: 100.0000 mg | ORAL_CAPSULE | Freq: Two times a day (BID) | ORAL | 0 refills | Status: DC | PRN

## 2018-12-30 NOTE — Progress Notes (Signed)
Spiro/DLCO performed today. 

## 2018-12-30 NOTE — Patient Instructions (Signed)
Your CT scan show you have something called pulmonary fibrosis We have discussed possible use of medication to slow down.  However because of side effects we decided to hold off We will start you on Tessalon 100 mg twice daily as needed for cough Give you a flu shot today  Follow-up in 3 months.

## 2018-12-30 NOTE — Progress Notes (Signed)
Kristi Alexander    [id number]    10/12/1925  Primary Care Physician:Patient, No Pcp Per  Referring Physician: Modena Nunnery D, DO [address],  [place] [address]  Chief complaint: Follow-up for idiopathic pulmonary fibrosis  HPI: 85 year old with cardiomyopathy, heart failure, arthritis Hospitalized in April for heart failure with reduced ejection fraction.  Evaluated by cardiology and she was not a candidate for heart catheterization.  She is being managed medically.   Referred for evaluation of CT scan which showed pulmonary fibrosis.  She had some serologies sent for connective tissue disease recently which showed elevated rheumatoid factor  States that she has chronic dyspnea on exertion which is unchanged over several years.  Denies any cough, sputum production, fevers, chills.  She has diffuse arthritis which is attributed to osteoarthritis.  + morning stiffness.  No dry mouth, dry eyes, difficulty swallowing.   Pets: No pets Occupation: Used to work as a Network engineer in the past Exposures: No known exposures, no mold, hot tub, Jacuzzi, asbestos ILD questionnaire 09/24/2018-negative Smoking history: 10-pack-year smoker.  Quit in 1975 Travel history: Previously lived in [place] Relevant family history: Daughter was told that she has lung disease with honeycombing  Interim history: Received note from Dr. Kathlene November, [place] dated 09/30/2018 Evaluated for positive rheumatoid factor, ANA. Clinically she does not have any synovitis to suggest inflammatory arthritis.  Low suspicion for active rheumatoid arthritis or active lupus Checking [place] ab, double-stranded DNA, complement levels Advised joint exercises, Tylenol, topical creams.  States that her breathing is doing well with no issues.  Denies any dyspnea Here for review of CT scan and plan for next steps.  Outpatient Encounter Medications as of 12/30/2018   Medication Sig  . acetaminophen (TYLENOL) 500 MG tablet Take 500 mg every 6 (six) hours as needed by mouth (pain).  Marland Kitchen atorvastatin (LIPITOR) 20 MG tablet Take 20 mg by mouth daily at 6 PM.   . benzonatate (TESSALON) 100 MG capsule TAKE 1 CAPSULE BY MOUTH EVERY 8 HOURS  . Calcium Carb-Cholecalciferol (CALCIUM-VITAMIN D) 600-400 MG-UNIT TABS Take 1 tablet by mouth once daily  . carvedilol (COREG) 6.25 MG tablet Take 1 tablet (6.25 mg total) by mouth 2 (two) times daily with a meal.  . cetirizine (ZYRTEC) 10 MG tablet Take 10 mg daily as needed by mouth for allergies.  Marland Kitchen diclofenac sodium (VOLTAREN) 1 % GEL Apply 2 g topically 4 (four) times daily. (Patient taking differently: Apply 2 g topically 4 (four) times daily as needed (PAIN). )  . ferrous sulfate 325 (65 FE) MG tablet Take 325 mg daily with breakfast by mouth.  . fluticasone (FLONASE) 50 MCG/ACT nasal spray Place 1 spray daily as needed into both nostrils for allergies or rhinitis.  . furosemide (LASIX) 20 MG tablet Take one tablet daily.  If you gain 3lbs in one day or 5lbs in a week take an extra tablet and call your doctor  . hydrOXYzine (ATARAX/VISTARIL) 25 MG tablet Take 1 tablet (25 mg total) by mouth daily as needed for itching.  . isosorbide mononitrate (IMDUR) 30 MG 24 hr tablet Take 1 tablet (30 mg total) by mouth daily.  . meloxicam (MOBIC) 15 MG tablet Take 1 tablet (15 mg total) by mouth daily.  . Multiple Vitamin (MULTIVITAMIN WITH MINERALS) TABS tablet Take 1 tablet by mouth daily.  . sacubitril-valsartan (ENTRESTO) 49-51 MG Take 1 tablet by mouth 2 (two) times daily.  Marland Kitchen spironolactone (  ALDACTONE) 50 MG tablet Take 0.5 tablets (25 mg total) by mouth daily.  . Vitamin D, Ergocalciferol, (DRISDOL) 50000 units CAPS capsule Take 1 capsule (50,000 Units total) by mouth every 7 (seven) days.   No facility-administered encounter medications on file as of 12/30/2018.    Physical Exam: Blood pressure 120/70, pulse 70, temperature  98.3 F (36.8 C), temperature source Oral, height 5' (1.524 m), weight 128 lb 12.8 oz (58.4 kg), SpO2 98 %. Gen:      No acute distress HEENT:  EOMI, sclera anicteric Neck:     No masses; no thyromegaly Lungs:    Bibasal crackles CV:         Regular rate and rhythm; no murmurs Abd:      + bowel sounds; soft, non-tender; no palpable masses, no distension Ext:    Mild clubbing; adequate peripheral perfusion Skin:      Warm and dry; no rash Neuro: alert and oriented x 3 Psych: normal mood and affect  Data Reviewed: Imaging: CT abdomen 01/10/2017- visualized lung bases show fibrosis, reticulation with mild honeycombing  CT chest 05/05/2018- centrilobular emphysema.  Subpleural reticulation, subpleural cystic changes.  Honeycombing in the left and right upper lobe  High-res CT 11/25/2018-respiratory status of subpleural reticulation, traction bronchiectasis and honeycombing with basal gradient.  Definite UIP pattern. I have reviewed the images personally.  PFTs: 02/01/2015 FVC 2.88 [192%], FEV1 1.81 [155%], F/F 63, TLC 3.89 [81%], DLCO 12.58 [58%] Minimal obstruction.  Moderate diffusion defect.  No restriction  11/29/2018 FVC 2.12 (69%), FEV1 1.78 [182%],/F 84 Normal spirometry  Labs: CTD 6/26- ANA, Jo 1, centromere, CCP, SCL 70-negative Rheumatoid factor 22.5  Assessment:  IPF Had a CT reviewed with UIP pattern pulmonary fibrosis.  She has a family history of pulmonary fibrosis.  This is diagnostic of IPF. Evaluated by rheumatology for elevated rheumatoid factor and ANA and not felt to have any connective tissue disease  I discussed options in detail with patient and her daughter in office today.  We reviewed scenarios including supportive care or antifibrotic treatment.  Given her advanced age and frailty her family is concerned about side effects and have elected not to undergo any antifibrotic treatment.  I think this is a reasonable choice as she is not symptomatic and has normal  FVC. We will start Tessalon for cough Give flu vaccine.  More then 1/2 the time of the 40 min visit was spent in counseling and/or coordination of care with the patient and family.  Plan/Recommendations: - Tessalon for cough - Flu vaccination  Follow-up in 3 months.  Marshell Garfinkel MD [facility] 12/30/2018, 11:04 AM  CC: Modena Nunnery D, DO

## 2019-01-26 ENCOUNTER — Other Ambulatory Visit: Payer: Self-pay | Admitting: *Deleted

## 2019-01-26 MED ORDER — VITAMIN D (ERGOCALCIFEROL) 1.25 MG (50000 UNIT) PO CAPS: 50000.0000 [IU] | ORAL_CAPSULE | ORAL | 3 refills | Status: DC

## 2019-02-02 ENCOUNTER — Other Ambulatory Visit: Payer: Self-pay | Admitting: Pulmonary Disease

## 2019-02-15 ENCOUNTER — Encounter: Payer: Self-pay | Admitting: Podiatry

## 2019-02-15 ENCOUNTER — Other Ambulatory Visit: Payer: Self-pay | Admitting: *Deleted

## 2019-02-15 ENCOUNTER — Other Ambulatory Visit: Payer: Self-pay

## 2019-02-15 ENCOUNTER — Ambulatory Visit: Payer: Medicare Other | Admitting: Podiatry

## 2019-02-15 DIAGNOSIS — M19072 Primary osteoarthritis, left ankle and foot: Secondary | ICD-10-CM

## 2019-02-15 DIAGNOSIS — M19071 Primary osteoarthritis, right ankle and foot: Secondary | ICD-10-CM

## 2019-02-15 DIAGNOSIS — M19079 Primary osteoarthritis, unspecified ankle and foot: Secondary | ICD-10-CM

## 2019-02-15 MED ORDER — CARVEDILOL 6.25 MG PO TABS: 6.2500 mg | ORAL_TABLET | Freq: Two times a day (BID) | ORAL | 5 refills | Status: DC

## 2019-02-15 MED ORDER — ENTRESTO 49-51 MG PO TABS: 1.0000 | ORAL_TABLET | Freq: Two times a day (BID) | ORAL | 5 refills | Status: DC

## 2019-02-15 NOTE — Progress Notes (Signed)
She presents today chief complaint of pain to the plantar and dorsal midfoot bilaterally.  Objective: Vital signs are stable alert oriented x3 still has significant osteoarthritic change around the dorsal and plantar aspect of the TMT joints.  No erythema edema cellulitis drainage or odor no trauma.  Assessment: Osteoarthritis of the midfoot.  Plan: The majority of her symptoms are plantarly so we went ahead and injected just distal to the navicular across the plantar aspect of the midfoot 20 mg of Kenalog 5 mg Marcaine after sterile Betadine skin prep bilaterally.  She tolerated procedure well without complications.

## 2019-03-08 DIAGNOSIS — H2511 Age-related nuclear cataract, right eye: Secondary | ICD-10-CM | POA: Diagnosis not present

## 2019-03-08 DIAGNOSIS — H268 Other specified cataract: Secondary | ICD-10-CM | POA: Diagnosis not present

## 2019-03-08 DIAGNOSIS — H25811 Combined forms of age-related cataract, right eye: Secondary | ICD-10-CM | POA: Diagnosis not present

## 2019-03-09 ENCOUNTER — Other Ambulatory Visit: Payer: Self-pay | Admitting: Internal Medicine

## 2019-03-09 DIAGNOSIS — I10 Essential (primary) hypertension: Secondary | ICD-10-CM | POA: Diagnosis not present

## 2019-03-09 DIAGNOSIS — E559 Vitamin D deficiency, unspecified: Secondary | ICD-10-CM | POA: Diagnosis not present

## 2019-03-09 DIAGNOSIS — E785 Hyperlipidemia, unspecified: Secondary | ICD-10-CM | POA: Diagnosis not present

## 2019-03-10 ENCOUNTER — Other Ambulatory Visit: Payer: Self-pay | Admitting: Pulmonary Disease

## 2019-03-11 DIAGNOSIS — J849 Interstitial pulmonary disease, unspecified: Secondary | ICD-10-CM | POA: Diagnosis not present

## 2019-03-11 DIAGNOSIS — M1990 Unspecified osteoarthritis, unspecified site: Secondary | ICD-10-CM | POA: Diagnosis not present

## 2019-03-11 DIAGNOSIS — E785 Hyperlipidemia, unspecified: Secondary | ICD-10-CM | POA: Diagnosis not present

## 2019-03-11 DIAGNOSIS — I2510 Atherosclerotic heart disease of native coronary artery without angina pectoris: Secondary | ICD-10-CM | POA: Diagnosis not present

## 2019-03-11 DIAGNOSIS — D649 Anemia, unspecified: Secondary | ICD-10-CM | POA: Diagnosis not present

## 2019-03-11 DIAGNOSIS — I10 Essential (primary) hypertension: Secondary | ICD-10-CM | POA: Diagnosis not present

## 2019-03-11 DIAGNOSIS — I5042 Chronic combined systolic (congestive) and diastolic (congestive) heart failure: Secondary | ICD-10-CM | POA: Diagnosis not present

## 2019-03-15 NOTE — Telephone Encounter (Signed)
Patient requested to be seen sooner and is Sch with [facility] on 03/18/2019 to be seen.

## 2019-03-16 DIAGNOSIS — H2512 Age-related nuclear cataract, left eye: Secondary | ICD-10-CM | POA: Diagnosis not present

## 2019-03-18 ENCOUNTER — Ambulatory Visit: Payer: Medicare PPO | Admitting: Internal Medicine

## 2019-03-18 ENCOUNTER — Other Ambulatory Visit: Payer: Self-pay

## 2019-03-18 ENCOUNTER — Encounter: Payer: Self-pay | Admitting: Internal Medicine

## 2019-03-18 DIAGNOSIS — M7542 Impingement syndrome of left shoulder: Secondary | ICD-10-CM

## 2019-03-18 DIAGNOSIS — R1032 Left lower quadrant pain: Secondary | ICD-10-CM

## 2019-03-18 DIAGNOSIS — R109 Unspecified abdominal pain: Secondary | ICD-10-CM

## 2019-03-18 DIAGNOSIS — M25512 Pain in left shoulder: Secondary | ICD-10-CM

## 2019-03-18 DIAGNOSIS — G8929 Other chronic pain: Secondary | ICD-10-CM | POA: Diagnosis not present

## 2019-03-18 NOTE — Assessment & Plan Note (Addendum)
Notes 2w hx of  LLQ abdominal pain that is worse with twisting. She believes it started while laying in bed and turning over to the side. When not moving, there is no pain. She is unable to describe the pain. Denies n/v, diarrhea, constipation, hematochezia, melena, fever, chills or dysuria. No change in symptoms since they started. PE is unremarkable. She has no pain on palpation and I'm unable to appreciate a hernia. Assessment: most likely MSK in nature as only certain movements aggrevate it and it is otherwise not present. Does not really fit the picture of nephrolithiasis or UTI. Also considered diverticulitis or other enteropathy however no changes in bowel habits, lack of PE findings or fever decrease liklihood of this.  Plan: conservative management for now. Encouraged her to return to the clinic if symptoms worsen or do not improve over the next 2w at which time we can pursue further imaging.

## 2019-03-18 NOTE — Progress Notes (Signed)
   CC: abdominal pain, shoulder pain-chronic  HPI:  [name] is a 86 y.o. female  who presents for LLQ abd pain and chronic shoulder pain. Please see problem based assessment and plan for additional details.     Past Medical History:  Diagnosis Date  . Abnormality of gait 11/22/2014  . Acute exacerbation of CHF (congestive heart failure) ([employer]) 06/23/2018  . Arthritis   . Cyst of breast, right, benign solitary   . Dyslipidemia   . H/O: hysterectomy   . Heart murmur   . History of appendectomy   . History of lumbosacral spine surgery   . Hypertension   . Inguinal hernia   . Lumbar stenosis   . Restless leg syndrome     Review of Systems:  Review of Systems - General ROS: negative for - chills or fever Gastrointestinal ROS: positive for - abdominal pain negative for - appetite loss, blood in stools, change in bowel habits, change in stools, constipation, diarrhea, melena or nausea/vomiting Genito-Urinary ROS: negative for - dysuria Musculoskeletal ROS: negative for - muscular weakness   Physical Exam:  Vitals:   03/18/19 1012  BP: (!) 159/61  Pulse: 67  Temp: 98.5 F (36.9 C)  TempSrc: Oral  SpO2: 99%  Weight: 125 lb 4.8 oz (56.8 kg)  Height: 5\' 1"  (1.549 m)    GENERAL: well appearing, in no apparent distress HEENT: no conjunctival injection. Nares patent.  CARDIAC: heart regular rate and rhythm, no peripheral edema appreciated PULMONARY: lung sounds clear to auscultation ABDOMEN: bowel sounds active.  SKIN: no rash or lesion on limited exam NEURO: CN II-XII grossly intact   Assessment & Plan:   See Encounters Tab for problem based charting.  Pertinent labs & imaging results that were available during my care of the patient were reviewed by me and considered in my medical decision making  Patient is in agreement with the plan and endorses no further questions at this time.  Patient discussed with Dr. Venetia Maxon, MD Internal  Medicine Resident-PGY1 03/18/19

## 2019-03-18 NOTE — Assessment & Plan Note (Signed)
Patient also notes left shoulder pain and inquires about a steroid injection into it. Unfortunately, no preceptor is available to supervise that with me today so assisted her in setting up an appt on 2/1 to get that done.

## 2019-03-18 NOTE — Patient Instructions (Signed)
It was a pleasure to meet you today. Your abdominal discomfort is most likely muscular. I would encourage you to try to take it easy and use tylenol for it. If it does not get better or gets worse in the next 2 weeks, please come see [place]. For your shoulder injection, I will help you get scheduled for next week.

## 2019-03-22 NOTE — Progress Notes (Signed)
Internal Medicine Clinic Attending  I saw and evaluated the patient.  I personally confirmed the key portions of the history and exam documented by Dr. Christian   and I reviewed pertinent patient test results.  The assessment, diagnosis, and plan were formulated together and I agree with the documentation in the resident's note.  

## 2019-03-28 ENCOUNTER — Other Ambulatory Visit: Payer: Self-pay

## 2019-03-28 ENCOUNTER — Encounter: Payer: Self-pay | Admitting: Internal Medicine

## 2019-03-28 ENCOUNTER — Encounter: Payer: Self-pay | Admitting: *Deleted

## 2019-03-28 ENCOUNTER — Ambulatory Visit: Payer: Medicare PPO | Admitting: Internal Medicine

## 2019-03-28 DIAGNOSIS — M7542 Impingement syndrome of left shoulder: Secondary | ICD-10-CM

## 2019-03-28 DIAGNOSIS — M7552 Bursitis of left shoulder: Secondary | ICD-10-CM | POA: Diagnosis not present

## 2019-03-28 DIAGNOSIS — Z79899 Other long term (current) drug therapy: Secondary | ICD-10-CM

## 2019-03-28 DIAGNOSIS — I5022 Chronic systolic (congestive) heart failure: Secondary | ICD-10-CM

## 2019-03-28 DIAGNOSIS — I10 Essential (primary) hypertension: Secondary | ICD-10-CM

## 2019-03-28 DIAGNOSIS — I110 Hypertensive heart disease with heart failure: Secondary | ICD-10-CM

## 2019-03-28 NOTE — Progress Notes (Unsigned)

## 2019-03-28 NOTE — Patient Instructions (Signed)
Kristi Alexander, It was a pleasure meeting you! Today we did a steroid injection for your shoulder which I hope gives you some relief. Remember, it may be more sore the first night, but should feel relief after that. Let us know if you have any concerns with this.   For your blood pressure, we will not make any medication changes today since your numbers at home are running well. Next visit I'll have you bring in your  Home blood pressure cuff to compare the two readings to ensure we are getting accurate readings here and at home.   Take care, and I'll see you in about 4 months.   Dr. Koleen Distance

## 2019-03-29 ENCOUNTER — Encounter: Payer: Self-pay | Admitting: Internal Medicine

## 2019-03-29 DIAGNOSIS — H268 Other specified cataract: Secondary | ICD-10-CM | POA: Diagnosis not present

## 2019-03-29 DIAGNOSIS — H25812 Combined forms of age-related cataract, left eye: Secondary | ICD-10-CM | POA: Diagnosis not present

## 2019-03-29 DIAGNOSIS — H2512 Age-related nuclear cataract, left eye: Secondary | ICD-10-CM | POA: Diagnosis not present

## 2019-03-29 NOTE — Assessment & Plan Note (Signed)
Patient states she is doing well on current GDMT for heart failure. Her weights have been stable. She denies any leg swelling, shortness of breath with exertion, orthopnea, PND, or chest pain. Will continue current management. Follows with Dr. Terrence Dupont.

## 2019-03-29 NOTE — Progress Notes (Signed)
CC: shoulder pain, HTN   HPI:  [name] is a 86 y.o. with PMHx HFrEF, HTN who presents for left shoulder pain and follow-up on chronic medical conditions. Please see problem based charting for further details.   Past Medical History:  Diagnosis Date  . Abnormality of gait 11/22/2014  . Acute exacerbation of CHF (congestive heart failure) ([facility]) 06/23/2018  . Arthritis   . Cyst of breast, right, benign solitary   . Dyslipidemia   . H/O: hysterectomy   . Heart murmur   . History of appendectomy   . History of lumbosacral spine surgery   . Hypertension   . Inguinal hernia   . Lumbar stenosis   . Restless leg syndrome    Review of Systems:   Review of Systems  Constitutional: Negative for chills and fever.  HENT: Negative for hearing loss.   Eyes: Negative for blurred vision.  Respiratory: Negative for cough and shortness of breath.   Cardiovascular: Negative for chest pain, palpitations, orthopnea, leg swelling and PND.  Gastrointestinal: Negative for abdominal pain, nausea and vomiting.  Genitourinary: Negative for dysuria.  Musculoskeletal: Positive for joint pain. Negative for falls.  Neurological: Negative for dizziness, sensory change, focal weakness, loss of consciousness and headaches.  Psychiatric/Behavioral: Negative for depression.     Physical Exam:  Vitals:   03/28/19 1409  BP: (!) 188/82  Pulse: 65  Temp: 98.6 F (37 C)  TempSrc: Oral  SpO2: 99%  Weight: 127 lb 1.6 oz (57.7 kg)   Physical Exam Constitutional:      General: She is not in acute distress.    Appearance: Normal appearance.  Eyes:     Conjunctiva/sclera: Conjunctivae normal.  Cardiovascular:     Rate and Rhythm: Normal rate and regular rhythm.     Pulses: Normal pulses.  Pulmonary:     Effort: Pulmonary effort is normal.     Breath sounds: Normal breath sounds.  Abdominal:     General: There is no distension.     Palpations: Abdomen is soft.     Tenderness: There is no  abdominal tenderness.  Musculoskeletal:     Left shoulder: Tenderness and crepitus present. No swelling or deformity. Decreased range of motion.     Cervical back: Neck supple.     Comments: + painful arc   Neurological:     Mental Status: She is alert.      Assessment & Plan:   See Encounters Tab for problem based charting.  Patient seen with Dr. Heber Ketchum   PROCEDURE NOTE  PROCEDURE: left shoulder joint steroid injection.  PREOPERATIVE DIAGNOSIS: Bursitis of the left shoulder.  POSTOPERATIVE DIAGNOSIS: Bursitis of the left shoulder.  PROCEDURE: The patient was apprised of the risks and the benefits of the procedure and informed consent was obtained, as witnessed by Adline Peals. Time-out procedure was performed, with confirmation of the patient's name, date of birth, and correct identification of the left shoulder to be injected. The patient's shoulder was then marked at the appropriate site for injection placement. The shoulder was sterilely prepped with Betadine. A 40 mg (1 milliliter) solution of Kenalog was drawn up into a 3 mL syringe with a 2 mL of 1% lidocaine. The patient was injected with a 27 gauge needle at the lateral  aspect of her  left shoulder. There were no complications. The patient tolerated the procedure well. There was minimal bleeding. The patient was instructed to ice her shoulder upon leaving clinic and refrain from overuse over  the next 3 days. The patient was instructed to go to the emergency room with any usual pain, swelling, or redness occurred in the injected area. The patient was given a followup appointment to evaluate response to the injection to his increased range of motion and reduction of pain.  The procedure was supervised by attending physician, Dr. Angelia Mould.

## 2019-03-29 NOTE — Assessment & Plan Note (Signed)
Patient's current regimen includes Coreg 6.25 BID, Lasix 20 mg, Imdur 30 mg, and Entresto 49-51 mg daily. Her blood pressure is elevated in the office today. Patient is asymptomatic. She states she keeps a daily blood pressure log at home with readings ranging from 599-357 systolic. She states her blood pressure is always more elevated at doctor visits. I will hold off on medication titration for now, but instructed her to call if she has readings at home as consistently elevated as her reading today. Also instructed her to bring her blood pressure cuff in to the next visit to compare readings. Would like her to be as well controlled as possible given her recent echo finding last year of newly reduced EF due to worsening hypertensive cardiomyopathy.

## 2019-03-30 ENCOUNTER — Encounter: Payer: Self-pay | Admitting: Internal Medicine

## 2019-03-30 NOTE — Progress Notes (Signed)
Things That May Be Affecting Your Health:  Alcohol  Hearing loss  Pain   x Depression  Home Safety  Sexual Health   Diabetes x Lack of physical activity  Stress  x Difficulty with daily activities  Loneliness  Tiredness   Drug use x Medicines  Tobacco use   Falls  Motor Vehicle Safety  Weight  x Food choices  Oral Health  Other    YOUR PERSONALIZED HEALTH PLAN : 1. Schedule your next subsequent Medicare Wellness visit in one year 2. Attend all of your regular appointments to address your medical issues 3. Complete the preventative screenings and services   Annual Wellness Visit   Medicare Covered Preventative Screenings and [place] Men and Women Who How Often Need? Date of Last Service Action  Abdominal Aortic Aneurysm Adults with AAA risk factors Once     Alcohol Misuse and Counseling All Adults Screening once a year if no alcohol misuse. Counseling up to 4 face to face sessions.     Bone Density Measurement  Adults at risk for osteoporosis Once every 2 yrs x 2017   Lipid Panel Z13.6 All adults without CV disease Once every 5 yrs     Colorectal Cancer   Stool sample or  Colonoscopy All adults 73 and older   Once every year  Every 10 years     Depression All Adults Once a year  Today   Diabetes Screening Blood glucose, post glucose load, or GTT Z13.1  All adults at risk  Pre-diabetics  Once per year  Twice per year     Diabetes  Self-Management Training All adults Diabetics 10 hrs first year; 2 hours subsequent years. Requires Copay     Glaucoma  Diabetics  Family history of glaucoma  African Americans 54 yrs +  Hispanic Americans 50 yrs + Annually - requires coppay     Hepatitis C Z72.89 or F19.20  High Risk for HCV  Born between 1945 and 1965  Annually  Once     HIV Z11.4 All adults based on risk  Annually btw ages 53 & 17 regardless of risk  Annually > 65 yrs if at increased risk     Lung Cancer Screening Asymptomatic  adults aged 47-77 with 30 pack yr history and current smoker OR quit within the last 15 yrs Annually Must have counseling and shared decision making documentation before first screen     Medical Nutrition Therapy Adults with   Diabetes  Renal disease  Kidney transplant within past 3 yrs 3 hours first year; 2 hours subsequent years     Obesity and Counseling All adults Screening once a year Counseling if BMI 30 or higher  Today   Tobacco Use Counseling Adults who use tobacco  Up to 8 visits in one year     Vaccines Z23  Hepatitis B  Influenza   Pneumonia  Adults   Once  Once every flu season  Two different vaccines separated by one year     Next Annual Wellness Visit People with Medicare Every year  Today     Services & Screenings Women Who How Often Need  Date of Last Service Action  Mammogram  Z12.31 Women over 28 One baseline ages 28-39. Annually ager 40 yrs+     Pap tests All women Annually if high risk. Every 2 yrs for normal risk women     Screening for cervical cancer with   Pap (Z01.419 nl or Z01.411abnl) &  HPV Z11.51 Women aged 51 to 43 Once every 5 yrs     Screening pelvic and breast exams All women Annually if high risk. Every 2 yrs for normal risk women     Sexually Transmitted Diseases  Chlamydia  Gonorrhea  Syphilis All at risk adults Annually for non pregnant females at increased risk         [place] Men Who How Ofter Need  Date of Last Service Action  Prostate Cancer - DRE & PSA Men over 50 Annually.  DRE might require a copay.     Sexually Transmitted Diseases  Syphilis All at risk adults Annually for men at increased risk

## 2019-04-03 NOTE — Progress Notes (Signed)
Internal Medicine Clinic Attending  I saw and evaluated the patient.  I personally confirmed the key portions of the history and exam documented by Dr. Koleen Distance and I reviewed pertinent patient test results.  The assessment, diagnosis, and plan were formulated together and I agree with the documentation in the resident's note.    I was present for the entire procedure, there were no complications.

## 2019-04-04 ENCOUNTER — Ambulatory Visit: Payer: Medicare Other | Admitting: Pulmonary Disease

## 2019-04-04 ENCOUNTER — Other Ambulatory Visit: Payer: Self-pay

## 2019-04-04 ENCOUNTER — Encounter: Payer: Self-pay | Admitting: Primary Care

## 2019-04-04 ENCOUNTER — Ambulatory Visit: Payer: Medicare PPO | Admitting: Primary Care

## 2019-04-04 VITALS — BP 150/68 | HR 62 | Temp 97.3°F | Ht 62.0 in | Wt 128.4 lb

## 2019-04-04 DIAGNOSIS — J84112 Idiopathic pulmonary fibrosis: Secondary | ICD-10-CM

## 2019-04-04 DIAGNOSIS — J849 Interstitial pulmonary disease, unspecified: Secondary | ICD-10-CM

## 2019-04-04 MED ORDER — HYDROXYZINE HCL 25 MG PO TABS: 25.0000 mg | ORAL_TABLET | Freq: Every day | ORAL | 0 refills | Status: DC | PRN

## 2019-04-04 MED ORDER — BENZONATATE 100 MG PO CAPS: 100.0000 mg | ORAL_CAPSULE | Freq: Two times a day (BID) | ORAL | 2 refills | Status: AC | PRN

## 2019-04-04 MED ORDER — FLUTICASONE PROPIONATE 50 MCG/ACT NA SUSP: 1.0000 | Freq: Every day | NASAL | 2 refills | Status: AC | PRN

## 2019-04-04 NOTE — Assessment & Plan Note (Addendum)
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 Patient/family not interested in antifibrotic treatment d.t age and medication SE's - Patient has no shortness of breath and continues to do well. Treating chronic cough with nasal corticosteriods, tessalon perles and prn Hydroxyzine   - Plan monitor symptoms and recheck PFTs in 6 months to monitor FEV1 and DLCO  - Up to date with influenza and pneumococcal vaccine. Given contact information to schedule Covid vaccine

## 2019-04-04 NOTE — Patient Instructions (Addendum)
Recommendations: Continue flonase nasal spray daily Continue Hydroxyzine 25mg  as needed daily for allergy symptoms Continue tessalon perles 1-2 times a day as needed for cough   Orders: PFTs with DLCO in 6 months  Follow-up: 6 months with Dr. Vaughan Browner or sooner if you develop worsening shortness of breath    COVID-19 Vaccine Information can be found at: [email or web address] For questions related to vaccine distribution or appointments, please email [email or web address] or call [phone].     Pulmonary Fibrosis  Pulmonary fibrosis is a type of lung disease that causes scarring. Over time, the scar tissue builds up in the air sacs of your lungs (alveoli). This makes it hard for you to breathe. Less oxygen can get into your blood. Scarring from pulmonary fibrosis gets worse over time. This damage is permanent and may lead to other serious health problems. What are the causes? There are many different causes of pulmonary fibrosis. Sometimes the cause is not known. This is called idiopathic pulmonary fibrosis. Other causes include:  Exposure to chemicals and substances found in agricultural, farm, Architect, or factory work. These include mold, asbestos, silica, metal dusts, and toxic fumes.  Sarcoidosis. In this disease, areas of inflammatory cells (granulomas) form and most often affect the lungs.  Autoimmune diseases. These include diseases such as rheumatoid arthritis, systemic sclerosis, or connective tissue disease.  Taking certain medicines. These include drugs used in radiation therapy or used to treat seizures, heart problems, and some infections. What increases the risk? You are more likely to develop this condition if:  You have a family history of the disease.  You are older. The condition is more common in older adults.  You have a history of smoking.  You have a job that exposes you to certain  chemicals.  You have gastroesophageal reflux disease (GERD). What are the signs or symptoms? Symptoms of this condition include:  Difficulty breathing that gets worse with activity.  Shortness of breath (dyspnea).  Dry, hacking cough.  Rapid, shallow breathing during exercise or while at rest.  Bluish skin and lips.  Loss of appetite.  Weakness.  Weight loss and fatigue.  Rounded and enlarged fingertips (clubbing). How is this diagnosed? This condition may be diagnosed based on:  Your symptoms and medical history.  A physical exam. You may also have tests, including:  A test that involves looking inside your lungs with an instrument (bronchoscopy).  Imaging studies of your lungs and heart.  Tests to measure how well you are breathing (pulmonary function tests).  Blood tests.  Tests to see how well your lungs work while you are walking (pulmonary stress test).  A procedure to remove a lung tissue sample to look at it under a microscope (biopsy). How is this treated? There is no cure for pulmonary fibrosis. Treatment focuses on managing symptoms and preventing scarring from getting worse. This may include:  Medicines, such as: ? Steroids to prevent permanent lung changes. ? Medicines to suppress your body's defense system (immune system). ? Medicines to help with lung function by reducing inflammation or scarring.  Ongoing monitoring with X-rays and lab work.  Oxygen therapy.  Pulmonary rehabilitation.  Surgery. In some cases, a lung transplant is possible. Follow these instructions at home:     Medicines  Take over-the-counter and prescription medicines only as told by your health care provider.  Keep your vaccinations up to date as recommended by your health care provider. General instructions  Do not use any products that contain nicotine or tobacco, such as  cigarettes and e-cigarettes. If you need help quitting, ask your health care  provider.  Get regular exercise, but do not overexert yourself. Ask your health care provider to suggest some activities that are safe for you to do. ? If you have physical limitations, you may get exercise by walking, using a stationary bike, or doing chair exercises. ? Ask your health care provider about using oxygen while exercising.  If you are exposed to chemicals and substances at work, make sure that you wear a mask or respirator at all times.  Join a pulmonary rehabilitation program or a support group for people with pulmonary fibrosis.  Eat small meals often so you do not get too full. Overeating can make breathing trouble worse.  Maintain a healthy weight. Lose weight if you need to.  Do breathing exercises as directed by your health care provider.  Keep all follow-up visits as told by your health care provider. This is important. Contact a health care provider if you:  Have symptoms that do not get better with medicines.  Are not able to be as active as usual.  Have trouble taking a deep breath.  Have a fever or chills.  Have blue lips or skin.  Have clubbing of your fingers. Get help right away if you:  Have a sudden worsening of your symptoms.  Have chest pain.  Cough up mucus that is dark in color.  Have a lot of headaches.  Get very confused or sleepy. Summary  Pulmonary fibrosis is a type of lung disease that causes scar tissue to build up in the air sacs of your lungs (alveoli) over time. Less oxygen can get into your blood. This makes it hard for you to breathe.  Scarring from pulmonary fibrosis gets worse over time. This damage is permanent and may lead to other serious health problems.  You are more likely to develop this condition if you have a family history of the condition or a job that exposes you to certain chemicals.  There is no cure for pulmonary fibrosis. Treatment focuses on managing symptoms and preventing scarring from getting  worse. This information is not intended to replace advice given to you by your health care provider. Make sure you discuss any questions you have with your health care provider. Document Revised: 03/18/2017 Document Reviewed: 03/18/2017 Elsevier Patient Education  2020 [employer].

## 2019-04-04 NOTE — Progress Notes (Signed)
@Patient  ID: Kristi Alexander, female    DOB: 1925-03-16, 86 y.o.   MRN: [id number]  Chief Complaint  Patient presents with  . Follow-up    Referring provider: Delice Bison, DO  HPI: 86 year old female, former smoker quit in 2005 (10-pack-year history).  Past medical history significant for interstitial lung disease, hypertension, chronic heart failure with reduced ejection fraction, pulmonary hypertension, GI bleed.  Patient of Dr. Vaughan Browner, last seen December 30, 2018.  Originally referred in July 2020 for abnormal chest CT.  Serology showed elevated rheumatoid factor. Evaluated by rheumatology for elevated RF and ANA and not felt to have any connective tissue disease. CT consistent with UIP pattern of pulmonary fibrosis which is diagnostic for IPF.  There is a family history of pulmonary fibrosis.  Patient and family elected not to undergo any antifibrotic treatment due to age and possible side effects.  Started on Tessalon for cough and received flu vaccine at last visit.  04/04/2019 Patient presents today for 62-month follow-up. Accompanied by her daughter. Breathing has been stable. She is experiencing no shortness of breath symptoms. Continues to be able to do all normal ADLs. States that she has allergies and her nose runs which causes her to cough a lot. Tesslon perles have helped her cough. Needs refill of flonase and hydroxyzine prn. Denies shortness of breath, chest tightness or wheezing.   Allergies  Allergen Reactions  . Penicillins Swelling    Has patient had a PCN reaction causing immediate rash, facial/tongue/throat swelling, SOB or lightheadedness with hypotension: Yes Has patient had a PCN reaction causing severe rash involving mucus membranes or skin necrosis: No Has patient had a PCN reaction that required hospitalization: No Has patient had a PCN reaction occurring within the last 10 years: No If all of the above answers are "NO", then may proceed with Cephalosporin  use.   . Pravastatin Other (See Comments)    Cramps     Immunization History  Administered Date(s) Administered  . Influenza, High Dose Seasonal PF 12/23/2016, 12/30/2018  . Influenza,inj,Quad PF,6+ Mos 12/05/2014, 11/11/2017  . Influenza-Unspecified 12/24/2017  . Pneumococcal Conjugate-13 11/28/2015  . Pneumococcal Polysaccharide-23 01/06/2017  . Tdap 01/06/2017    Past Medical History:  Diagnosis Date  . Abnormality of gait 11/22/2014  . Acute exacerbation of CHF (congestive heart failure) ([place]) 06/23/2018  . Arthritis   . Cyst of breast, right, benign solitary   . Dyslipidemia   . H/O: hysterectomy   . Heart murmur   . History of appendectomy   . History of lumbosacral spine surgery   . Hypertension   . Inguinal hernia   . Lumbar stenosis   . Restless leg syndrome     Tobacco History: Social History   Tobacco Use  Smoking Status Former Smoker  . Packs/day: 0.25  . Years: 40.00  . Pack years: 10.00  . Types: Cigarettes  . Quit date: 02/25/2003  . Years since quitting: 16.1  Smokeless Tobacco Never Used   Counseling given: Not Answered   Outpatient Medications Prior to Visit  Medication Sig Dispense Refill  . acetaminophen (TYLENOL) 500 MG tablet Take 500 mg every 6 (six) hours as needed by mouth (pain).    . Calcium Carb-Cholecalciferol (CALCIUM-VITAMIN D) 600-400 MG-UNIT TABS Take 1 tablet by mouth once daily 90 tablet 1  . carvedilol (COREG) 6.25 MG tablet Take 1 tablet (6.25 mg total) by mouth 2 (two) times daily with a meal. 60 tablet 5  . diclofenac sodium (VOLTAREN) 1 %  GEL Apply 2 g topically 4 (four) times daily. (Patient taking differently: Apply 2 g topically 4 (four) times daily as needed (PAIN). ) 1 Tube 2  . furosemide (LASIX) 20 MG tablet Take one tablet daily.  If you gain 3lbs in one day or 5lbs in a week take an extra tablet and call your doctor 30 tablet 0  . isosorbide mononitrate (IMDUR) 30 MG 24 hr tablet Take 1 tablet (30 mg total) by mouth  daily. 135 tablet 3  . meloxicam (MOBIC) 15 MG tablet Take 1 tablet (15 mg total) by mouth daily. 30 tablet 3  . Multiple Vitamin (MULTIVITAMIN WITH MINERALS) TABS tablet Take 1 tablet by mouth daily.    . sacubitril-valsartan (ENTRESTO) 49-51 MG Take 1 tablet by mouth 2 (two) times daily. 60 tablet 5  . spironolactone (ALDACTONE) 50 MG tablet Take 1/2 (one-half) tablet by mouth once daily 90 tablet 0  . Vitamin D, Ergocalciferol, (DRISDOL) 1.25 MG (50000 UT) CAPS capsule Take 1 capsule (50,000 Units total) by mouth every 7 (seven) days. 30 capsule 3  . benzonatate (TESSALON) 100 MG capsule TAKE 1 CAPSULE BY MOUTH TWICE DAILY AS NEEDED FOR COUGH 30 capsule 0  . fluticasone (FLONASE) 50 MCG/ACT nasal spray Place 1 spray daily as needed into both nostrils for allergies or rhinitis.    . hydrOXYzine (ATARAX/VISTARIL) 25 MG tablet Take 1 tablet (25 mg total) by mouth daily as needed for itching. 90 tablet 0   No facility-administered medications prior to visit.   Review of Systems  Review of Systems  Constitutional: Negative.   HENT: Positive for postnasal drip.   Respiratory: Positive for cough. Negative for chest tightness, shortness of breath and wheezing.    Physical Exam  BP (!) 150/68 (BP Location: Right Arm, Cuff Size: Normal)   Pulse 62   Temp (!) 97.3 F (36.3 C) (Temporal)   Ht 5\' 2"  (1.575 m)   Wt 128 lb 6.4 oz (58.2 kg)   SpO2 97% Comment: RA  BMI 23.48 kg/m  Physical Exam Constitutional:      General: She is not in acute distress.    Appearance: Normal appearance. She is normal weight. She is not ill-appearing.  HENT:     Head: Normocephalic and atraumatic.     Mouth/Throat:     Mouth: Mucous membranes are moist.     Pharynx: Oropharynx is clear.  Cardiovascular:     Rate and Rhythm: Normal rate and regular rhythm.  Pulmonary:     Comments: Fine crackles at bases; no shortness of breath  Musculoskeletal:        General: Normal range of motion.  Skin:    General:  Skin is warm and dry.  Neurological:     General: No focal deficit present.     Mental Status: She is alert and oriented to person, place, and time. Mental status is at baseline.  Psychiatric:        Mood and Affect: Mood normal.        Behavior: Behavior normal.        Thought Content: Thought content normal.        Judgment: Judgment normal.      Lab Results:  CBC    Component Value Date/Time   WBC 5.8 06/23/2018 1335   RBC 4.28 06/23/2018 1335   HGB 12.3 06/23/2018 1335   HGB 11.5 04/15/2017 1512   HCT 38.8 06/23/2018 1335   HCT 36.6 04/15/2017 1512   PLT 220 06/23/2018 1335  PLT 292 04/15/2017 1512   MCV 90.7 06/23/2018 1335   MCV 86 04/15/2017 1512   MCH 28.7 06/23/2018 1335   MCHC 31.7 06/23/2018 1335   RDW 17.6 (H) 06/23/2018 1335   RDW 17.9 (H) 04/15/2017 1512   LYMPHSABS 1.0 06/23/2018 1040   LYMPHSABS 1.8 10/24/2014 1218   MONOABS 0.7 06/23/2018 1040   EOSABS 0.2 06/23/2018 1040   EOSABS 0.3 10/24/2014 1218   BASOSABS 0.0 06/23/2018 1040   BASOSABS 0.0 10/24/2014 1218    BMET    Component Value Date/Time   NA 138 07/01/2018 1052   K 4.4 07/01/2018 1052   CL 102 07/01/2018 1052   CO2 26 07/01/2018 1052   GLUCOSE 183 (H) 07/01/2018 1052   GLUCOSE 124 (H) 06/26/2018 0256   BUN 25 07/01/2018 1052   CREATININE 1.23 (H) 07/01/2018 1052   CALCIUM 9.4 07/01/2018 1052   GFRNONAA 38 (L) 07/01/2018 1052   GFRAA 44 (L) 07/01/2018 1052    BNP    Component Value Date/Time   BNP 291.3 (H) 08/20/2018 0941    ProBNP    Component Value Date/Time   PROBNP 1,083.0 (H) 05/18/2013 0520    Imaging: No results found.   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 Patient/family not interested in antifibrotic treatment d.t age and medication SE's - Patient has no shortness of breath and continues to do well. Treating chronic cough with nasal corticosteriods, tessalon perles  and prn Hydroxyzine   - Plan monitor symptoms and recheck PFTs in 6 months to monitor FEV1 and DLCO  - Up to date with influenza and pneumococcal vaccine. Given contact information to schedule Covid vaccine    Martyn Ehrich, NP 04/04/2019

## 2019-04-21 ENCOUNTER — Encounter: Payer: Self-pay | Admitting: *Deleted

## 2019-04-21 NOTE — Progress Notes (Signed)

## 2019-04-26 NOTE — Progress Notes (Signed)
Things That May Be Affecting Your Health:  Alcohol  Hearing loss x Pain    Depression x Home Safety  Sexual Health   Diabetes  Lack of physical activity  Stress  x Difficulty with daily activities  Loneliness  Tiredness   Drug use  Medicines  Tobacco use  x Falls  Motor Vehicle Safety  Weight  x Food choices  Oral Health  Other    YOUR PERSONALIZED HEALTH PLAN : 1. Schedule your next subsequent Medicare Wellness visit in one year 2. Attend all of your regular appointments to address your medical issues 3. Complete the preventative screenings and services   Annual Wellness Visit   Medicare Covered Preventative Screenings and [place] Men and Women Who How Often Need? Date of Last Service Action  Abdominal Aortic Aneurysm Adults with AAA risk factors Once     Alcohol Misuse and Counseling All Adults Screening once a year if no alcohol misuse. Counseling up to 4 face to face sessions.     Bone Density Measurement  Adults at risk for osteoporosis Once every 2 yrs     Lipid Panel Z13.6 All adults without CV disease Once every 5 yrs     Colorectal Cancer   Stool sample or  Colonoscopy All adults 25 and older   Once every year  Every 10 years     Depression All Adults Once a year x Today   Diabetes Screening Blood glucose, post glucose load, or GTT Z13.1  All adults at risk  Pre-diabetics  Once per year  Twice per year     Diabetes  Self-Management Training All adults Diabetics 10 hrs first year; 2 hours subsequent years. Requires Copay     Glaucoma  Diabetics  Family history of glaucoma  African Americans 57 yrs +  Hispanic Americans 40 yrs + Annually - requires coppay     Hepatitis C Z72.89 or F19.20  High Risk for HCV  Born between 1945 and 1965  Annually  Once     HIV Z11.4 All adults based on risk  Annually btw ages 102 & 64 regardless of risk  Annually > 65 yrs if at increased risk     Lung Cancer Screening Asymptomatic adults  aged 32-77 with 30 pack yr history and current smoker OR quit within the last 15 yrs Annually Must have counseling and shared decision making documentation before first screen     Medical Nutrition Therapy Adults with   Diabetes  Renal disease  Kidney transplant within past 3 yrs 3 hours first year; 2 hours subsequent years     Obesity and Counseling All adults Screening once a year Counseling if BMI 30 or higher  Today   Tobacco Use Counseling Adults who use tobacco  Up to 8 visits in one year     Vaccines Z23  Hepatitis B  Influenza   Pneumonia  Adults   Once  Once every flu season  Two different vaccines separated by one year     Next Annual Wellness Visit People with Medicare Every year  Today     Services & Screenings Women Who How Often Need  Date of Last Service Action  Mammogram  Z12.31 Women over 67 One baseline ages 62-39. Annually ager 40 yrs+     Pap tests All women Annually if high risk. Every 2 yrs for normal risk women     Screening for cervical cancer with   Pap (Z01.419 nl or Z01.411abnl) &  HPV Z11.51 Women aged 76 to 46 Once every 5 yrs     Screening pelvic and breast exams All women Annually if high risk. Every 2 yrs for normal risk women     Sexually Transmitted Diseases  Chlamydia  Gonorrhea  Syphilis All at risk adults Annually for non pregnant females at increased risk         [place] Men Who How Ofter Need  Date of Last Service Action  Prostate Cancer - DRE & PSA Men over 50 Annually.  DRE might require a copay.     Sexually Transmitted Diseases  Syphilis All at risk adults Annually for men at increased risk

## 2019-04-28 ENCOUNTER — Ambulatory Visit: Payer: Medicare PPO | Attending: Internal Medicine

## 2019-04-28 DIAGNOSIS — Z23 Encounter for immunization: Secondary | ICD-10-CM

## 2019-04-28 NOTE — Progress Notes (Signed)
   Covid-19 Vaccination Clinic  Name:  Kristi Alexander    MRN: [id number] DOB: Mar 27, 1925  04/28/2019  [name] was observed post Covid-19 immunization for 15 minutes without incident. She was provided with Vaccine Information Sheet and instruction to access the V-Safe system.   [name] was instructed to call 911 with any severe reactions post vaccine: Marland Kitchen Difficulty breathing  . Swelling of face and throat  . A fast heartbeat  . A bad rash all over body  . Dizziness and weakness   Immunizations Administered    Name Date Dose VIS Date Route   Pfizer COVID-19 Vaccine 04/28/2019  2:54 PM 0.3 mL 02/04/2019 Intramuscular   Manufacturer: [place]   Lot: [id number]   [place]: [id number]

## 2019-05-04 ENCOUNTER — Ambulatory Visit: Payer: Medicare PPO | Admitting: Internal Medicine

## 2019-05-04 ENCOUNTER — Telehealth: Payer: Medicare PPO | Admitting: Internal Medicine

## 2019-05-25 ENCOUNTER — Ambulatory Visit: Payer: Medicare PPO | Attending: Internal Medicine

## 2019-05-25 DIAGNOSIS — Z23 Encounter for immunization: Secondary | ICD-10-CM

## 2019-05-25 NOTE — Progress Notes (Signed)
   Covid-19 Vaccination Clinic  Name:  AYAME RENA    MRN: [id number] DOB: May 10, 1925  05/25/2019  Ms. Kemnitz was observed post Covid-19 immunization for 15 minutes without incident. She was provided with Vaccine Information Sheet and instruction to access the V-Safe system.   Ms. Hustead was instructed to call 911 with any severe reactions post vaccine: Marland Kitchen Difficulty breathing  . Swelling of face and throat  . A fast heartbeat  . A bad rash all over body  . Dizziness and weakness   Immunizations Administered    Name Date Dose VIS Date Route   Pfizer COVID-19 Vaccine 05/25/2019  9:59 AM 0.3 mL 02/04/2019 Intramuscular   Manufacturer: [place]   Lot: [id number]   [place]: [id number]

## 2019-05-26 ENCOUNTER — Other Ambulatory Visit: Payer: Self-pay | Admitting: Internal Medicine

## 2019-06-01 ENCOUNTER — Encounter: Payer: Self-pay | Admitting: Internal Medicine

## 2019-06-01 ENCOUNTER — Other Ambulatory Visit: Payer: Self-pay

## 2019-06-01 ENCOUNTER — Ambulatory Visit: Payer: Medicare PPO | Admitting: Internal Medicine

## 2019-06-01 VITALS — BP 135/71 | HR 107 | Wt 126.3 lb

## 2019-06-01 DIAGNOSIS — Z79899 Other long term (current) drug therapy: Secondary | ICD-10-CM | POA: Diagnosis not present

## 2019-06-01 DIAGNOSIS — M549 Dorsalgia, unspecified: Secondary | ICD-10-CM | POA: Diagnosis not present

## 2019-06-01 DIAGNOSIS — G8929 Other chronic pain: Secondary | ICD-10-CM | POA: Diagnosis not present

## 2019-06-01 DIAGNOSIS — Z87891 Personal history of nicotine dependence: Secondary | ICD-10-CM | POA: Diagnosis not present

## 2019-06-01 MED ORDER — DICLOFENAC SODIUM 1 % EX GEL: 2.0000 g | Freq: Four times a day (QID) | CUTANEOUS | 2 refills | Status: AC | PRN

## 2019-06-01 NOTE — Progress Notes (Signed)
CC: Left sided back pain  HPI:  Kristi Alexander is a 86 y.o. female with PMH below.  Today we will address left sided back pain.    Please see A&P for status of the patient's chronic medical conditions  Past Medical History:  Diagnosis Date  . Abnormality of gait 11/22/2014  . Acute exacerbation of CHF (congestive heart failure) ([place]) 06/23/2018  . Arthritis   . Cyst of breast, right, benign solitary   . Dyslipidemia   . H/O: hysterectomy   . Heart murmur   . History of appendectomy   . History of lumbosacral spine surgery   . Hypertension   . Inguinal hernia   . Lumbar stenosis   . Restless leg syndrome    Review of Systems:  ROS: Pulmonary: pt denies increased work of breathing, shortness of breath,  Cardiac: pt denies palpitations, chest pain,  Abdominal: pt denies abdominal pain, nausea, vomiting, or diarrhea   Physical Exam:  Vitals:   06/01/19 0926  BP: 135/71  Pulse: (!) 107  SpO2: 99%  Weight: 126 lb 4.8 oz (57.3 kg)   Cardiac:  normal rate and rhythm, clear s1 and s2, no rubs or gallops Pulmonary: Dry crackles, no wheezing, not in distress Abdominal: non distended abdomen, soft and nontender Psych: Alert, conversant, in good spirits MSK: no tenderness to palpation along the latissimus dorsi at the level of T6 just posterior to the mid axillary line.  Pt performed various movements including truncal rotation, had patient pull me against resistance with left arm engaging latissimus dorsi with no reproduction of pain.     Social History   Socioeconomic History  . Marital status: Married    Spouse name: [name]   . Number of children: 2  . Years of education: 12+  . Highest education level: Not on file  Occupational History  . Occupation: retired  Tobacco Use  . Smoking status: Former Smoker    Packs/day: 0.25    Years: 40.00    Pack years: 10.00    Types: Cigarettes    Quit date: 02/25/2003    Years since quitting: 16.2  . Smokeless tobacco:  Never Used  Substance and Sexual Activity  . Alcohol use: Yes    Comment: About 2 drinks per night  . Drug use: No  . Sexual activity: Not on file  Other Topics Concern  . Not on file  Social History Narrative   Patient is widowed and lives with her daugther. The patient is retired. Patient has 2 children and has a college education.    Patient is right handed.   Patient drinks very little caffeine.   Social Determinants of Health   Financial Resource Strain:   . Difficulty of Paying Living Expenses:   Food Insecurity:   . Worried About Charity fundraiser in the Last Year:   . Arboriculturist in the Last Year:   Transportation Needs:   . Film/video editor (Medical):   Marland Kitchen Lack of Transportation (Non-Medical):   Physical Activity:   . Days of Exercise per Week:   . Minutes of Exercise per Session:   Stress:   . Feeling of Stress :   Social Connections:   . Frequency of Communication with Friends and Family:   . Frequency of Social Gatherings with Friends and Family:   . Attends Religious Services:   . Active Member of Clubs or Organizations:   . Attends Archivist Meetings:   Marland Kitchen Marital  Status:   Intimate Partner Violence:   . Fear of Current or Ex-Partner:   . Emotionally Abused:   Marland Kitchen Physically Abused:   . Sexually Abused:     Family History  Problem Relation Age of Onset  . Heart disease Mother   . Diabetes Mother   . Heart attack Father   . Lung cancer Brother   . Healthy Sister   . Stomach cancer Son     Assessment & Plan:   See Encounters Tab for problem based charting.  Patient discussed with Dr. Angelia Mould

## 2019-06-01 NOTE — Progress Notes (Signed)
Internal Medicine Clinic Attending  Case discussed with Dr. Winfrey  at the time of the visit.  We reviewed the resident's history and exam and pertinent patient test results.  I agree with the assessment, diagnosis, and plan of care documented in the resident's note.  

## 2019-06-01 NOTE — Assessment & Plan Note (Signed)
She reports no trauma to the area.  Only bothers her when she moves around and does things, especially with truncal rotation and pulling herself up.  It seems to be concentrated along the latissimus dorsi at the level of T6 just posterior to the mid axillary line based on her description.  On examination today I cannot reproduce the pain and the patient jokes "when I come to the doctor I'm in perfect health".  Given her minimal symptoms, advanced age and comorbid conditions I will favor a conservative approach.    -prescribe voltaren gel, continue tylenol, she has a few meloxicam left over from her old prescription which I told her she can take for a few days on her bad days -return precautions given

## 2019-06-01 NOTE — Patient Instructions (Addendum)
Ms. Kerce I have prescribed an antiinflammatory gel to rub on your side where you are having the pain.  Additionally you can take some tylenol and meloxicam and purchase a heating pad if you do not already have one.  If your symptoms progress or worsen let us know and we can reevaluate you.

## 2019-06-15 DIAGNOSIS — I10 Essential (primary) hypertension: Secondary | ICD-10-CM | POA: Diagnosis not present

## 2019-06-15 DIAGNOSIS — E559 Vitamin D deficiency, unspecified: Secondary | ICD-10-CM | POA: Diagnosis not present

## 2019-06-15 DIAGNOSIS — J849 Interstitial pulmonary disease, unspecified: Secondary | ICD-10-CM | POA: Diagnosis not present

## 2019-06-15 DIAGNOSIS — E785 Hyperlipidemia, unspecified: Secondary | ICD-10-CM | POA: Diagnosis not present

## 2019-06-15 DIAGNOSIS — I5042 Chronic combined systolic (congestive) and diastolic (congestive) heart failure: Secondary | ICD-10-CM | POA: Diagnosis not present

## 2019-06-15 DIAGNOSIS — M1990 Unspecified osteoarthritis, unspecified site: Secondary | ICD-10-CM | POA: Diagnosis not present

## 2019-06-15 DIAGNOSIS — I2510 Atherosclerotic heart disease of native coronary artery without angina pectoris: Secondary | ICD-10-CM | POA: Diagnosis not present

## 2019-06-28 ENCOUNTER — Encounter: Payer: Self-pay | Admitting: Internal Medicine

## 2019-06-28 ENCOUNTER — Other Ambulatory Visit: Payer: Self-pay

## 2019-06-28 ENCOUNTER — Ambulatory Visit (INDEPENDENT_AMBULATORY_CARE_PROVIDER_SITE_OTHER): Payer: Medicare PPO | Admitting: Internal Medicine

## 2019-06-28 VITALS — BP 143/71 | HR 98 | Ht 61.0 in | Wt 126.8 lb

## 2019-06-28 DIAGNOSIS — Z0000 Encounter for general adult medical examination without abnormal findings: Secondary | ICD-10-CM

## 2019-06-28 NOTE — Progress Notes (Signed)
I discussed the AWV findings with the RN who conducted the visit. I was present in the office suite and immediately available to provide assistance and direction throughout the time the service was provided.   

## 2019-06-28 NOTE — Progress Notes (Signed)
This AWV is being conducted by [place] only. The patient was located at home and I was located in [facility]. The patient's identity was confirmed using their DOB and current address. The patient or his/her legal guardian has consented to being evaluated through a telephone encounter and understands the associated risks (an examination cannot be done and the patient may need to come in for an appointment) / benefits (allows the patient to remain at home, decreasing exposure to coronavirus). I personally spent 37 minutes conducting the AWV.  Subjective:   Kristi Alexander is a 86 y.o. female who presents for a Medicare Annual Wellness Visit.  The following items have been reviewed and updated today in the appropriate area in the EMR.   Health Risk Assessment  Height, weight, BMI, and BP Visual acuity if needed Depression screen Fall risk / safety level Advance directive discussion Medical and family history were reviewed and updated Updating list of other providers & suppliers Medication reconciliation, including over the counter medicines Cognitive screen Written screening schedule Risk Factor list Personalized health advice, risky behaviors, and treatment advice  Social History   Social History Narrative   Current Social History 06/28/2019        Patient lives with family daughter, [name] in a one level home. There are not steps up to the entrance the patient uses.       Patient's method of transportation is via daughter.      The highest level of education was Dietitian in [place].      The patient currently retired.      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 Jahi Roza, BSN, RN-BC            Objective:    Vitals: BP (!) 143/71 (BP Location: Left Arm, Patient Position: Sitting, Cuff Size: Normal)   Pulse 98   Ht 5\' 1"  (1.549 m)   Wt 126 lb 12.8 oz (57.5 kg)   BMI 23.96 kg/m  Vitals are patient reported  Activities of Daily Living In your present state of health, do you have any difficulty performing the following activities: 06/28/2019 06/01/2019  Hearing? N N  Vision? N N  Comment recent cataract removal -  Difficulty concentrating or making decisions? Y N  Walking or climbing stairs? N Y  Comment Doesn't do stairs -  Dressing or bathing? N Y  Doing errands, shopping? Y Y  Some recent data might be hidden    Goals Goals    . Maintain current level of physical activity     30 minutes daily of floor bike, stationary bike, and pole  dancing        Fall Risk Fall Risk  06/28/2019 06/01/2019 03/28/2019 03/18/2019 07/01/2018  Falls in the past year? 0 0 0 0 0  Number falls in past yr: - - 0 0 -  Injury with Fall? - -  0 - -  Risk Factor Category  - - - - -  Risk for fall due to : Impaired balance/gait;Impaired mobility Impaired balance/gait Impaired balance/gait - Impaired balance/gait;Impaired mobility  Risk for fall due to: Comment - - - - -  Follow up Falls prevention discussed;Education provided Falls evaluation completed Falls evaluation completed - Falls prevention discussed   CDC Handout on Fall Prevention and Handout on Home Exercise Program, Access codes [email or web address] and [email or web address] mailed to patient with yellow exercise band.   Depression Screen PHQ 2/9 Scores 06/28/2019 06/01/2019 03/18/2019 07/01/2018  PHQ - 2 Score 0 0 0 0  PHQ- 9 Score 3 3 0 2     Cognitive Testing Six-Item Cognitive Screener   "I would like to ask you some questions that ask you to use your memory. I am  going to name three objects. Please wait until I say all three words, then repeat them. Remember what they are  because I am going to ask you to name them again in a few minutes. Please repeat these words for me: APPLE--TABLE--PENNY." (Interviewer may repeat names 3 times if necessary but repetition not scored.)  Did patient correctly repeat all three words? Yes - may proceed with screen  What year is this? Correct What month is this? Correct What day of the week is this? Correct  What were the three objects I asked you to remember? . Apple Correct . Table Correct . Penny Correct  Score one point for each incorrect answer.  A score of 2 or more points warrants additional investigation.  Patient's score 0    Assessment and Plan:     Patient exercises 30 minutes daily but will also begin seated and standing exercises with exercise band.  During the course of the visit the patient was educated and counseled about appropriate screening and preventive services as documented in the assessment and plan.  The printed AVS was given to the patient and included an updated screening schedule, a list of risk factors, and personalized health advice.        Velora Heckler, RN  06/28/2019

## 2019-06-28 NOTE — Patient Instructions (Addendum)
Annual Wellness Visit   Medicare Covered Preventative Screenings and Services  Services & Screenings Men and Women Who How Often Need? Date of Last Service Action  Abdominal Aortic Aneurysm Adults with AAA risk factors Once     Alcohol Misuse and Counseling All Adults Screening once a year if no alcohol misuse. Counseling up to 4 face to face sessions.     Bone Density Measurement  Adults at risk for osteoporosis Once every 2 yrs     Lipid Panel Z13.6 All adults without CV disease Once every 5 yrs     Colorectal Cancer   Stool sample or  Colonoscopy All adults 35 and older   Once every year  Every 10 years     Depression All Adults Once a year  Today   Diabetes Screening Blood glucose, post glucose load, or GTT Z13.1  All adults at risk  Pre-diabetics  Once per year  Twice per year     Diabetes  Self-Management Training All adults Diabetics 10 hrs first year; 2 hours subsequent years. Requires Copay     Glaucoma  Diabetics  Family history of glaucoma  African Americans 84 yrs +  Hispanic Americans 84 yrs + Annually - requires coppay     Hepatitis C Z72.89 or F19.20  High Risk for HCV  Born between 1945 and 1965  Annually  Once     HIV Z11.4 All adults based on risk  Annually btw ages 84 & 87 regardless of risk  Annually > 65 yrs if at increased risk     Lung Cancer Screening Asymptomatic adults aged 54-77 with 30 pack yr history and current smoker OR quit within the last 15 yrs Annually Must have counseling and shared decision making documentation before first screen     Medical Nutrition Therapy Adults with   Diabetes  Renal disease  Kidney transplant within past 3 yrs 3 hours first year; 2 hours subsequent years     Obesity and Counseling All adults Screening once a year Counseling if BMI 30 or higher  Today   Tobacco Use Counseling Adults who use tobacco  Up to 8 visits in one year     Vaccines Z23  Hepatitis B  Influenza   Pneumonia  Adults    Once  Once every flu season  Two different vaccines separated by one year     Next Annual Wellness Visit People with Medicare Every year  Today     Services & Screenings Women Who How Often Need  Date of Last Service Action  Mammogram  Z12.31 Women over 15 One baseline ages 44-39. Annually ager 40 yrs+     Pap tests All women Annually if high risk. Every 2 yrs for normal risk women     Screening for cervical cancer with   Pap (Z01.419 nl or Z01.411abnl) &  HPV Z11.51 Women aged 44 to 54 Once every 5 yrs     Screening pelvic and breast exams All women Annually if high risk. Every 2 yrs for normal risk women     Sexually Transmitted Diseases  Chlamydia  Gonorrhea  Syphilis All at risk adults Annually for non pregnant females at increased risk         [place] Men Who How Ofter Need  Date of Last Service Action  Prostate Cancer - DRE & PSA Men over 50 Annually.  DRE might require a copay.     Sexually Transmitted Diseases  Syphilis All at risk adults Annually  for men at increased risk         Things That May Be Affecting Your Health:  Alcohol  Hearing loss X Pain    Depression  Home Safety  Sexual Health   Diabetes  Lack of physical activity  Stress   Difficulty with daily activities  Loneliness  Tiredness   Drug use  Medicines  Tobacco use   Falls  Motor Vehicle Safety  Weight   Food choices  Oral Health  Other    YOUR PERSONALIZED HEALTH PLAN : 1. Schedule your next subsequent Medicare Wellness visit in one year 2. Attend all of your regular appointments to address your medical issues 3. Complete the preventative screenings and services 4. Keep up the great work of exercising 30 minutes daily!! 5. Begin seated and standing exercises with exercise band.   Fall Prevention in the Home, Adult Falls can cause injuries. They can happen to people of all ages. There are many things you can do to make your home safe and to help prevent falls. Ask  for help when making these changes, if needed. What actions can I take to prevent falls? General Instructions  Use good lighting in all rooms. Replace any light bulbs that burn out.  Turn on the lights when you go into a dark area. Use night-lights.  Keep items that you use often in easy-to-reach places. Lower the shelves around your home if necessary.  Set up your furniture so you have a clear path. Avoid moving your furniture around.  Do not have throw rugs and other things on the floor that can make you trip.  Avoid walking on wet floors.  If any of your floors are uneven, fix them.  Add color or contrast paint or tape to clearly mark and help you see: ? Any grab bars or handrails. ? First and last steps of stairways. ? Where the edge of each step is.  If you use a stepladder: ? Make sure that it is fully opened. Do not climb a closed stepladder. ? Make sure that both sides of the stepladder are locked into place. ? Ask someone to hold the stepladder for you while you use it.  If there are any pets around you, be aware of where they are. What can I do in the bathroom?      Keep the floor dry. Clean up any water that spills onto the floor as soon as it happens.  Remove soap buildup in the tub or shower regularly.  Use non-skid mats or decals on the floor of the tub or shower.  Attach bath mats securely with double-sided, non-slip rug tape.  If you need to sit down in the shower, use a plastic, non-slip stool.  Install grab bars by the toilet and in the tub and shower. Do not use towel bars as grab bars. What can I do in the bedroom?  Make sure that you have a light by your bed that is easy to reach.  Do not use any sheets or blankets that are too big for your bed. They should not hang down onto the floor.  Have a firm chair that has side arms. You can use this for support while you get dressed. What can I do in the kitchen?  Clean up any spills right  away.  If you need to reach something above you, use a strong step stool that has a grab bar.  Keep electrical cords out of the way.  Do not  use floor polish or wax that makes floors slippery. If you must use wax, use non-skid floor wax. What can I do with my stairs?  Do not leave any items on the stairs.  Make sure that you have a light switch at the top of the stairs and the bottom of the stairs. If you do not have them, ask someone to add them for you.  Make sure that there are handrails on both sides of the stairs, and use them. Fix handrails that are broken or loose. Make sure that handrails are as long as the stairways.  Install non-slip stair treads on all stairs in your home.  Avoid having throw rugs at the top or bottom of the stairs. If you do have throw rugs, attach them to the floor with carpet tape.  Choose a carpet that does not hide the edge of the steps on the stairway.  Check any carpeting to make sure that it is firmly attached to the stairs. Fix any carpet that is loose or worn. What can I do on the outside of my home?  Use bright outdoor lighting.  Regularly fix the edges of walkways and driveways and fix any cracks.  Remove anything that might make you trip as you walk through a door, such as a raised step or threshold.  Trim any bushes or trees on the path to your home.  Regularly check to see if handrails are loose or broken. Make sure that both sides of any steps have handrails.  Install guardrails along the edges of any raised decks and porches.  Clear walking paths of anything that might make someone trip, such as tools or rocks.  Have any leaves, snow, or ice cleared regularly.  Use sand or salt on walking paths during winter.  Clean up any spills in your garage right away. This includes grease or oil spills. What other actions can I take?  Wear shoes that: ? Have a low heel. Do not wear high heels. ? Have rubber bottoms. ? Are comfortable  and fit you well. ? Are closed at the toe. Do not wear open-toe sandals.  Use tools that help you move around (mobility aids) if they are needed. These include: ? Canes. ? Walkers. ? Scooters. ? Crutches.  Review your medicines with your doctor. Some medicines can make you feel dizzy. This can increase your chance of falling. Ask your doctor what other things you can do to help prevent falls. Where to find more information  Centers for Disease Control and Prevention, STEADI: https://garcia.biz/  [employer] on Aging: BrainJudge.co.uk Contact a doctor if:  You are afraid of falling at home.  You feel weak, drowsy, or dizzy at home.  You fall at home. Summary  There are many simple things that you can do to make your home safe and to help prevent falls.  Ways to make your home safe include removing tripping hazards and installing grab bars in the bathroom.  Ask for help when making these changes in your home. This information is not intended to replace advice given to you by your health care provider. Make sure you discuss any questions you have with your health care provider. Document Revised: 06/03/2018 Document Reviewed: 09/25/2016 Elsevier Patient Education  2020 [place] Maintenance, Female Adopting a healthy lifestyle and getting preventive care are important in promoting health and wellness. Ask your health care provider about:  The right schedule for you to have regular tests and exams.  Things you can do on your own to prevent diseases and keep yourself healthy. What should I know about diet, weight, and exercise? Eat a healthy diet   Eat a diet that includes plenty of vegetables, fruits, low-fat dairy products, and lean protein.  Do not eat a lot of foods that are high in solid fats, added sugars, or sodium. Maintain a healthy weight Body mass index (BMI) is used to identify weight problems. It estimates body fat based on height  and weight. Your health care provider can help determine your BMI and help you achieve or maintain a healthy weight. Get regular exercise Get regular exercise. This is one of the most important things you can do for your health. Most adults should:  Exercise for at least 150 minutes each week. The exercise should increase your heart rate and make you sweat (moderate-intensity exercise).  Do strengthening exercises at least twice a week. This is in addition to the moderate-intensity exercise.  Spend less time sitting. Even light physical activity can be beneficial. Watch cholesterol and blood lipids Have your blood tested for lipids and cholesterol at 84 years of age, then have this test every 5 years. Have your cholesterol levels checked more often if:  Your lipid or cholesterol levels are high.  You are older than 84 years of age.  You are at high risk for heart disease. What should I know about cancer screening? Depending on your health history and family history, you may need to have cancer screening at various ages. This may include screening for:  Breast cancer.  Cervical cancer.  Colorectal cancer.  Skin cancer.  Lung cancer. What should I know about heart disease, diabetes, and high blood pressure? Blood pressure and heart disease  High blood pressure causes heart disease and increases the risk of stroke. This is more likely to develop in people who have high blood pressure readings, are of African descent, or are overweight.  Have your blood pressure checked: ? Every 3-5 years if you are 82-85 years of age. ? Every year if you are 93 years old or older. Diabetes Have regular diabetes screenings. This checks your fasting blood sugar level. Have the screening done:  Once every three years after age 46 if you are at a normal weight and have a low risk for diabetes.  More often and at a younger age if you are overweight or have a high risk for diabetes. What should I  know about preventing infection? Hepatitis B If you have a higher risk for hepatitis B, you should be screened for this virus. Talk with your health care provider to find out if you are at risk for hepatitis B infection. Hepatitis C Testing is recommended for:  Everyone born from [birth year] through 1965.  Anyone with known risk factors for hepatitis C. Sexually transmitted infections (STIs)  Get screened for STIs, including gonorrhea and chlamydia, if: ? You are sexually active and are younger than 84 years of age. ? You are older than 84 years of age and your health care provider tells you that you are at risk for this type of infection. ? Your sexual activity has changed since you were last screened, and you are at increased risk for chlamydia or gonorrhea. Ask your health care provider if you are at risk.  Ask your health care provider about whether you are at high risk for HIV. Your health care provider may recommend a prescription medicine to help prevent HIV infection. If you choose  to take medicine to prevent HIV, you should first get tested for HIV. You should then be tested every 3 months for as long as you are taking the medicine. Pregnancy  If you are about to stop having your period (premenopausal) and you may become pregnant, seek counseling before you get pregnant.  Take 400 to 800 micrograms (mcg) of folic acid every day if you become pregnant.  Ask for birth control (contraception) if you want to prevent pregnancy. Osteoporosis and menopause Osteoporosis is a disease in which the bones lose minerals and strength with aging. This can result in bone fractures. If you are 13 years old or older, or if you are at risk for osteoporosis and fractures, ask your health care provider if you should:  Be screened for bone loss.  Take a calcium or vitamin D supplement to lower your risk of fractures.  Be given hormone replacement therapy (HRT) to treat symptoms of menopause. Follow these  instructions at home: Lifestyle  Do not use any products that contain nicotine or tobacco, such as cigarettes, e-cigarettes, and chewing tobacco. If you need help quitting, ask your health care provider.  Do not use street drugs.  Do not share needles.  Ask your health care provider for help if you need support or information about quitting drugs. Alcohol use  Do not drink alcohol if: ? Your health care provider tells you not to drink. ? You are pregnant, may be pregnant, or are planning to become pregnant.  If you drink alcohol: ? Limit how much you use to 0-1 drink a day. ? Limit intake if you are breastfeeding.  Be aware of how much alcohol is in your drink. In the [place] one drink equals one 12 oz bottle of beer (355 mL), one 5 oz glass of wine (148 mL), or one 1 oz glass of hard liquor (44 mL). General instructions  Schedule regular health, dental, and eye exams.  Stay current with your vaccines.  Tell your health care provider if: ? You often feel depressed. ? You have ever been abused or do not feel safe at home. Summary  Adopting a healthy lifestyle and getting preventive care are important in promoting health and wellness.  Follow your health care provider's instructions about healthy diet, exercising, and getting tested or screened for diseases.  Follow your health care provider's instructions on monitoring your cholesterol and blood pressure. This information is not intended to replace advice given to you by your health care provider. Make sure you discuss any questions you have with your health care provider. Document Revised: 02/03/2018 Document Reviewed: 02/03/2018 Elsevier Patient Education  2020 [employer].

## 2019-07-01 NOTE — Progress Notes (Signed)
Internal Medicine Clinic Attending  Case discussed with Dr.  Marva Panda . We reviewed the AWV findings.  I agree with the assessment, diagnosis, and plan of care documented in the AWV note.

## 2019-08-20 ENCOUNTER — Other Ambulatory Visit: Payer: Self-pay | Admitting: Internal Medicine

## 2019-08-20 DIAGNOSIS — I10 Essential (primary) hypertension: Secondary | ICD-10-CM

## 2019-08-22 NOTE — Telephone Encounter (Signed)
Please have pt come in for blood pressure recheck. Thanks

## 2019-08-24 NOTE — Telephone Encounter (Signed)
Spoke with the patient.  She has sch an appt to come in tomorrow 08/25/2019 @ 1:15 pm to f/u with her BP Check.

## 2019-08-25 ENCOUNTER — Ambulatory Visit: Payer: Medicare PPO | Admitting: Internal Medicine

## 2019-08-25 ENCOUNTER — Other Ambulatory Visit: Payer: Self-pay

## 2019-08-25 ENCOUNTER — Encounter: Payer: Self-pay | Admitting: Internal Medicine

## 2019-08-25 VITALS — BP 152/65 | HR 65 | Temp 98.9°F | Ht 61.0 in | Wt 124.3 lb

## 2019-08-25 DIAGNOSIS — R053 Chronic cough: Secondary | ICD-10-CM

## 2019-08-25 DIAGNOSIS — R7303 Prediabetes: Secondary | ICD-10-CM

## 2019-08-25 DIAGNOSIS — G2581 Restless legs syndrome: Secondary | ICD-10-CM

## 2019-08-25 DIAGNOSIS — I10 Essential (primary) hypertension: Secondary | ICD-10-CM

## 2019-08-25 DIAGNOSIS — R05 Cough: Secondary | ICD-10-CM | POA: Diagnosis not present

## 2019-08-25 DIAGNOSIS — D509 Iron deficiency anemia, unspecified: Secondary | ICD-10-CM | POA: Diagnosis not present

## 2019-08-25 LAB — POCT GLYCOSYLATED HEMOGLOBIN (HGB A1C): Hemoglobin A1C: 5.5 % (ref 4.0–5.6)

## 2019-08-25 LAB — GLUCOSE, CAPILLARY: Glucose-Capillary: 105 mg/dL — ABNORMAL HIGH (ref 70–99)

## 2019-08-25 MED ORDER — LORATADINE 10 MG PO TABS: 10.0000 mg | ORAL_TABLET | Freq: Every day | ORAL | 0 refills | Status: AC | PRN

## 2019-08-25 NOTE — Patient Instructions (Addendum)
Kristi Alexander,  It was a pleasure seeing you in clinic. Today we discussed:   Hypertension: Please continue to take your medications as prescribed.  Please bring your blood pressure cuff and readings to next visit.  Cough: We will try Claritin for your cough at this time.  If persists please let us know and we can try a different medication.  Leg pain: I am checking lab work today to assess for any electrolyte abnormalities that may be causing this.  I will let you know the results.  Until then, please continue with symptomatic treatment.   If you have any questions or concerns, please call our clinic at [phone] between 9am-5pm and after hours call [phone] and ask for the internal medicine resident on call. If you feel you are having a medical emergency please call 911.   Thank you, we look forward to helping you remain healthy!

## 2019-08-25 NOTE — Progress Notes (Signed)
   CC: hypertension f/u, leg pain  HPI:  [name] is a 86 y.o. female with past medical history as listed below presenting for hypertension follow-up. She also endorses a nonproductive cough that is worse with recent weather change. Patient and daughter also concerned about bilateral leg pain that is worse at night and semi-relieved with movement and symptomatic treatment with heating pads. Please see problem based charting for complete assessment and plan.  Past Medical History:  Diagnosis Date  . Abnormality of gait 11/22/2014  . Acute exacerbation of CHF (congestive heart failure) ([place]) 06/23/2018  . Arthritis   . Cyst of breast, right, benign solitary   . Dyslipidemia   . H/O: hysterectomy   . Heart murmur   . History of appendectomy   . History of lumbosacral spine surgery   . Hypertension   . Inguinal hernia   . Lumbar stenosis   . Restless leg syndrome    Review of Systems: Negative except as stated in HPI  Physical Exam:  Vitals:   08/25/19 1339  Weight: 124 lb 4.8 oz (56.4 kg)   Physical Exam  Constitutional: Appears well-developed and well-nourished. No distress.  HENT: Normocephalic and atraumatic, EOMI, conjunctiva normal, moist mucous membranes Cardiovascular: Normal rate, regular rhythm, S1 and S2 present, no murmurs, rubs, gallops.  Distal pulses intact Respiratory: No respiratory distress, no accessory muscle use.  Effort is normal.  Lungs are clear to auscultation bilaterally. GI: Nondistended, soft, nontender to palpation, normal active bowel sounds Musculoskeletal: Normal bulk and tone.  No peripheral edema noted. Neurological: Is alert and oriented x4, no apparent focal deficits noted. Skin: Warm and dry.  No rash, erythema, lesions noted. Psychiatric: Normal mood and affect. Behavior is normal. Judgment and thought content normal.    Assessment & Plan:   See Encounters Tab for problem based charting.  Patient discussed with Dr. Heber [place]

## 2019-08-26 LAB — BMP8+ANION GAP
Anion Gap: 14 mmol/L (ref 10.0–18.0)
BUN/Creatinine Ratio: 22 (ref 12–28)
BUN: 29 mg/dL (ref 10–36)
CO2: 24 mmol/L (ref 20–29)
Calcium: 10.1 mg/dL (ref 8.7–10.3)
Chloride: 99 mmol/L (ref 96–106)
Creatinine, Ser: 1.32 mg/dL — ABNORMAL HIGH (ref 0.57–1.00)
GFR calc Af Amer: 40 mL/min/{1.73_m2} — ABNORMAL LOW (ref >59)
GFR calc non Af Amer: 35 mL/min/{1.73_m2} — ABNORMAL LOW (ref >59)
Glucose: 107 mg/dL — ABNORMAL HIGH (ref 65–99)
Potassium: 4.8 mmol/L (ref 3.5–5.2)
Sodium: 137 mmol/L (ref 134–144)

## 2019-08-26 LAB — CBC
Hematocrit: 34.4 % (ref 34.0–46.6)
Hemoglobin: 11.3 g/dL (ref 11.1–15.9)
MCH: 30.7 pg (ref 26.6–33.0)
MCHC: 32.8 g/dL (ref 31.5–35.7)
MCV: 94 fL (ref 79–97)
Platelets: 319 10*3/uL (ref 150–450)
RBC: 3.68 x10E6/uL — ABNORMAL LOW (ref 3.77–5.28)
RDW: 13.6 % (ref 11.7–15.4)
WBC: 7.5 10*3/uL (ref 3.4–10.8)

## 2019-08-26 LAB — IRON,TIBC AND FERRITIN PANEL
Ferritin: 69 ng/mL (ref 15–150)
Iron Saturation: 21 % (ref 15–55)
Iron: 55 ug/dL (ref 27–139)
Total Iron Binding Capacity: 267 ug/dL (ref 250–450)
UIBC: 212 ug/dL (ref 118–369)

## 2019-08-26 LAB — MAGNESIUM: Magnesium: 2.2 mg/dL (ref 1.6–2.3)

## 2019-08-27 MED ORDER — FERROUS SULFATE 325 (65 FE) MG PO TBEC: 325.0000 mg | DELAYED_RELEASE_TABLET | ORAL | 3 refills | Status: DC

## 2019-08-27 NOTE — Assessment & Plan Note (Signed)
Patient has a history of restless leg syndrome.  She notes that overnight she has leg pain that feels like a cramping sensation that is relieved with activity and symptomatic treatment with heating pads.  On examination, no significant findings noted on bilateral lower extremities.  Significant tenderness to palpation of bilateral calves.  Lab work without any electrolyte abnormalities.  However, does have mild iron deficiency with ferritin less than 75.  This could be contributing to worsening of her restless leg syndrome.  Plan Start ferrous sulfate 325 mg every other day Continue to monitor

## 2019-08-27 NOTE — Assessment & Plan Note (Signed)
Patient endorses nonproductive cough that has been present for the past few months. She denies any recent sick contacts. Notes that it is better in the winter and worse in spring and summer. Suspect possible allergies for which will try claritin.  However, on chart review, appears that she has a chronic cough in setting of GERD. If no significant improvement noted with Claritin, will do trial of PPI.

## 2019-08-27 NOTE — Assessment & Plan Note (Signed)
Blood pressure 152/65 at this visit patient patient is on Coreg 6.25 twice daily, Lasix 20 mg daily, Imdur 30 mg daily, Entresto 49-51 mg daily.  Patient endorses medication compliance and notes that her pressure at home is 1 48-8 40 systolic.  She denies any headaches, chest pain, vision changes, shortness of breath, focal deficits.  Patient was advised at previous visit to bring her blood pressure cuff into this visit.  However, she notes that she forgot this at home.  Plan Continue current regimen Advised to bring blood pressure cuff and reading log at next visit

## 2019-08-30 NOTE — Progress Notes (Signed)
Internal Medicine Clinic Attending ° °Case discussed with Dr. Aslam  At the time of the visit.  We reviewed the resident’s history and exam and pertinent patient test results.  I agree with the assessment, diagnosis, and plan of care documented in the resident’s note.  °

## 2019-09-09 ENCOUNTER — Other Ambulatory Visit: Payer: Self-pay | Admitting: Internal Medicine

## 2019-09-20 ENCOUNTER — Other Ambulatory Visit: Payer: Self-pay | Admitting: Internal Medicine

## 2019-09-21 DIAGNOSIS — I10 Essential (primary) hypertension: Secondary | ICD-10-CM | POA: Diagnosis not present

## 2019-09-21 DIAGNOSIS — E785 Hyperlipidemia, unspecified: Secondary | ICD-10-CM | POA: Diagnosis not present

## 2019-09-21 DIAGNOSIS — I2510 Atherosclerotic heart disease of native coronary artery without angina pectoris: Secondary | ICD-10-CM | POA: Diagnosis not present

## 2019-09-21 DIAGNOSIS — J849 Interstitial pulmonary disease, unspecified: Secondary | ICD-10-CM | POA: Diagnosis not present

## 2019-09-21 DIAGNOSIS — D649 Anemia, unspecified: Secondary | ICD-10-CM | POA: Diagnosis not present

## 2019-09-21 DIAGNOSIS — I5040 Unspecified combined systolic (congestive) and diastolic (congestive) heart failure: Secondary | ICD-10-CM | POA: Diagnosis not present

## 2019-09-21 NOTE — Progress Notes (Signed)
LVM to inform the pt of the Covid testing site change of location to [place] starting 09/26/19. Please show up at the scheduled time, and this new site is still a drive- thru. Please contact your ordering provider with any other questions/concerns.

## 2019-09-28 ENCOUNTER — Encounter: Payer: Self-pay | Admitting: Internal Medicine

## 2019-09-28 DIAGNOSIS — D509 Iron deficiency anemia, unspecified: Secondary | ICD-10-CM | POA: Insufficient documentation

## 2019-09-30 ENCOUNTER — Other Ambulatory Visit (HOSPITAL_COMMUNITY): Payer: Medicare PPO

## 2019-09-30 ENCOUNTER — Other Ambulatory Visit (HOSPITAL_COMMUNITY)
Admission: RE | Admit: 2019-09-30 | Discharge: 2019-09-30 | Disposition: A | Discharge status: Home / Self Care | Payer: Medicare PPO | Source: Ambulatory Visit | Attending: Primary Care | Admitting: Primary Care

## 2019-10-03 ENCOUNTER — Ambulatory Visit (INDEPENDENT_AMBULATORY_CARE_PROVIDER_SITE_OTHER): Payer: Medicare PPO | Admitting: Pulmonary Disease

## 2019-10-03 ENCOUNTER — Other Ambulatory Visit: Payer: Self-pay

## 2019-10-03 DIAGNOSIS — J84112 Idiopathic pulmonary fibrosis: Secondary | ICD-10-CM | POA: Diagnosis not present

## 2019-10-03 LAB — PULMONARY FUNCTION TEST
DL/VA: 2.93 ml/min/mmHg/L
DLCO cor: 9.43 ml/min/mmHg
DLCO unc: 8.76 ml/min/mmHg
FEF 25-75 Post: 1.19 L/sec
FEF 25-75 Pre: 1.23 L/sec
FEF2575-%Change-Post: -2 %
FEF2575-%Pred-Post: 183 %
FEF2575-%Pred-Pre: 188 %
FEV1-%Change-Post: 0 %
FEV1-%Pred-Post: 153 %
FEV1-%Pred-Pre: 154 %
FEV1-Post: 1.45 L
FEV1-Pre: 1.46 L
FEV1FVC-%Change-Post: 12 %
FEV1FVC-%Pred-Pre: 104 %
FEV6-%Change-Post: -11 %
FEV6-%Pred-Post: 147 %
FEV6-%Pred-Pre: 167 %
FEV6-Post: 1.68 L
FEV6-Pre: 1.91 L
FEV6FVC-%Change-Post: 0 %
FEV6FVC-%Pred-Post: 106 %
FEV6FVC-%Pred-Pre: 105 %
FVC-%Change-Post: -12 %
FVC-%Pred-Post: 138 %
FVC-%Pred-Pre: 158 %
FVC-Post: 1.68 L
FVC-Pre: 1.92 L
Post FEV1/FVC ratio: 86 %
Post FEV6/FVC ratio: 100 %
Pre FEV1/FVC ratio: 76 %
Pre FEV6/FVC Ratio: 99 %
RV % pred: 82 %
RV: 2.05 L
TLC % pred: 85 %
TLC: 3.94 L

## 2019-10-03 NOTE — Progress Notes (Signed)
PFT done today. 

## 2019-10-13 ENCOUNTER — Other Ambulatory Visit: Payer: Self-pay | Admitting: Internal Medicine

## 2019-10-18 ENCOUNTER — Other Ambulatory Visit: Payer: Self-pay | Admitting: Internal Medicine

## 2019-10-20 ENCOUNTER — Other Ambulatory Visit: Payer: Self-pay

## 2019-10-20 ENCOUNTER — Encounter: Payer: Self-pay | Admitting: Podiatry

## 2019-10-20 ENCOUNTER — Ambulatory Visit: Payer: Medicare PPO | Admitting: Podiatry

## 2019-10-20 DIAGNOSIS — M19071 Primary osteoarthritis, right ankle and foot: Secondary | ICD-10-CM | POA: Diagnosis not present

## 2019-10-20 DIAGNOSIS — M19079 Primary osteoarthritis, unspecified ankle and foot: Secondary | ICD-10-CM

## 2019-10-20 DIAGNOSIS — M19072 Primary osteoarthritis, left ankle and foot: Secondary | ICD-10-CM

## 2019-10-20 NOTE — Progress Notes (Signed)
She presents today for follow-up of her osteoarthritis of the midfoot bilaterally.  States that the shot really helped she says they just feel tight again and I am looking for another injection if I can get it.  Objective: Vital signs are stable she is alert and oriented x3 presents with her daughter today 86 years old pulses remain palpable no open lesions or wounds.  Severe fat atrophy plantar aspect of the bilateral foot arthritis easily palpable dorsally and plantarly.  Assessment: Osteoarthritis pes planus digital deformities capsulitis.  Plan: Discussed etiology pathology and surgical therapies at this point in time went ahead and injected the bilateral foot today plantar medial aspect along the tarsometatarsal joints.  20 mg of Kenalog was injected to the bilateral foot I will follow-up with her as needed.

## 2019-11-01 NOTE — Progress Notes (Signed)
Please let patient know PFTs were normal last month.

## 2019-11-02 DIAGNOSIS — H04123 Dry eye syndrome of bilateral lacrimal glands: Secondary | ICD-10-CM | POA: Diagnosis not present

## 2019-11-02 DIAGNOSIS — Z961 Presence of intraocular lens: Secondary | ICD-10-CM | POA: Diagnosis not present

## 2019-11-02 DIAGNOSIS — H43823 Vitreomacular adhesion, bilateral: Secondary | ICD-10-CM | POA: Diagnosis not present

## 2019-11-03 ENCOUNTER — Telehealth: Payer: Self-pay | Admitting: Pulmonary Disease

## 2019-11-03 NOTE — Assessment & Plan Note (Addendum)
Appears to be euvolemic on exam today. Continue current management.

## 2019-11-03 NOTE — Telephone Encounter (Signed)
Martyn Ehrich, NP  11/01/2019 1:41 PM EDT     Please let patient know PFTs were normal last month.    Called and spoke [name] ok per DPR and notified of results  Nothing further needed

## 2019-11-03 NOTE — Progress Notes (Signed)
Acute Office Visit   Patient ID: Kristi Alexander, female    DOB: 1926/02/18, 86 y.o.   MRN: [id number]  Subjective:  CC: right flank pain, appetite loss, weight loss, constipation, restlessness  HPI 86 y.o. presents today for evaluation of the above concerns.   Right flank pain has been going on for the past week. It is colicky in nature. Pain radiates around her side to her right upper quadrant. She denies fevers, chills, n/v, dysuria, change in urine frequency. No prior history of nephrolithiasis or similar type symptoms.  The appetite loss, weight loss, and constipation have been going on for over a year. We reviewed her weight graph which shows about a 20# loss over the past 53mo. She never feels hungry. She inquired about marijuana use for her appetite. She was started on an iron supplementation by Dr. Marva Panda at a prior visit for RLS. Pt notes that the constipation worsened after starting this and she has since discontinued taking it which has improved her constipation. She denies abdominal pain/bloating. Denies melena or hematochezia.   She also notes problems falling asleep and notes that she usually only sleeps from [place]. She is unable to nap during the day despite feeling fatigued. She says she feels restless and doesn't feel tired until 0300.       ACTIVE MEDICATIONS   Current Outpatient Medications on File Prior to Visit  Medication Sig Dispense Refill  . acetaminophen (TYLENOL) 500 MG tablet Take 500 mg every 6 (six) hours as needed by mouth (pain).    . benzonatate (TESSALON) 100 MG capsule Take 1 capsule (100 mg total) by mouth 2 (two) times daily as needed for cough. 60 capsule 2  . Calcium Carb-Cholecalciferol (CALCIUM-VITAMIN D) 600-400 MG-UNIT TABS Take 1 tablet by mouth once daily 90 tablet 1  . carvedilol (COREG) 6.25 MG tablet TAKE 1 TABLET BY MOUTH TWICE DAILY WITH A MEAL 60 tablet 2  . fluticasone (FLONASE) 50 MCG/ACT nasal spray Place 1 spray into both nostrils  daily as needed for allergies or rhinitis. 16 g 2  . hydrOXYzine (ATARAX/VISTARIL) 25 MG tablet Take 1 tablet (25 mg total) by mouth daily as needed for itching. 90 tablet 0  . isosorbide mononitrate (IMDUR) 30 MG 24 hr tablet Take 1 tablet by mouth once daily 90 tablet 1  . loratadine (CLARITIN) 10 MG tablet Take 1 tablet (10 mg total) by mouth daily as needed for allergies. 30 tablet 0  . Multiple Vitamin (MULTIVITAMIN WITH MINERALS) TABS tablet Take 1 tablet by mouth daily.    . sacubitril-valsartan (ENTRESTO) 49-51 MG Take 1 tablet by mouth 2 (two) times daily. 60 tablet 3  . spironolactone (ALDACTONE) 50 MG tablet Take 1/2 (one-half) tablet by mouth once daily 90 tablet 0   No current facility-administered medications on file prior to visit.    ROS  Review of Systems  Constitutional: Positive for appetite change and fatigue. Negative for activity change, chills and fever.  Respiratory: Positive for cough. Negative for shortness of breath.   Cardiovascular: Negative for chest pain.  Gastrointestinal: Positive for constipation. Negative for abdominal distention, abdominal pain, nausea and vomiting.  Genitourinary: Positive for flank pain. Negative for decreased urine volume, difficulty urinating, dysuria, frequency, hematuria and urgency.  Musculoskeletal: Positive for arthralgias.    Objective:   BP 138/63 (BP Location: Left Arm, Patient Position: Sitting, Cuff Size: Normal)   Pulse 75   Temp 98.2 F (36.8 C) (Oral)   Ht 5\' 1"  (1.549 m)  Wt 120 lb 11.2 oz (54.7 kg)   SpO2 100% Comment: room air  BMI 22.81 kg/m  Wt Readings from Last 3 Encounters:  11/07/19 120 lb 11.2 oz (54.7 kg)  08/25/19 124 lb 4.8 oz (56.4 kg)  06/28/19 126 lb 12.8 oz (57.5 kg)   BP Readings from Last 3 Encounters:  11/07/19 138/63  08/25/19 (!) 152/65  06/28/19 (!) 143/71   Physical Exam Constitutional:      Appearance: Normal appearance. She is not ill-appearing or toxic-appearing.    Cardiovascular:     Rate and Rhythm: Normal rate and regular rhythm.  Pulmonary:     Effort: Pulmonary effort is normal.     Breath sounds: Normal breath sounds.  Abdominal:     General: There is no distension.     Palpations: Abdomen is soft.     Tenderness: There is right CVA tenderness.  Musculoskeletal:        General: No swelling.  Skin:    General: Skin is warm and dry.     Assessment & Plan:   Problem List Items Addressed This Visit      Cardiovascular and Mediastinum   Combined congestive systolic and diastolic heart failure ([place])    Appears to be euvolemic on exam today. Continue current management.        Other   Right flank pain - Primary    1w history of colicky right flank pain with radiation to RUQ. Denies fever chills or UTI symptoms. My main thought is nephrolithiasis. She does have some CVA tenderness on the right. Pyelonephritis considered but less likely given her overall well appearance and lack of systemic symptoms. Malignancy is possible in the setting of significant smoking history but would consider this less likely given the acute onset. Other consideration is MSK however she did not have tenderness to palpation of her paraspinal muscles and pain characteristics are not really consistent with this.  Plan: CT abd/pelvis without contrast to r/o nephrolithiasis. Will hold off on labs Return precautions discussed with her.      Constipation    Associated with iron supplementation. Has improved since stopping them.       Generalized symptoms    Appetite loss, 20# non-intentional weight loss over 18 months, fatigue. Malignancy is of concern, especially in the setting of constipation. Last colonoscopy was in 2013 which did show some sessile and partial sessile polyps. She is denying any blood or dark stools. No history of colon cancer.  Iron panel from July 2021 reviewed and did not show iron deficiency. Suspect her fatigue may be related to poor appetite  vs insomnia vs heart failure. Insomnia possibly related to age. Her PHQ9 is 0 so don't suspect depression. Plan: will have her monitor for a couple weeks to see if her appetite improves with less constipation since stopping the iron supplementation. Will have her return in 2w to further discuss this.  Further workup would likely include a colonoscopy however, given her age and other comorbidities, I will hold off on this for now.  We could consider obtaining a TSH and repeat labs at next visit however would not expect labs to change significantly as symptoms have not. Regarding her inquiry into marijuana, we discussed that [place] has not passed medical marijuana law and that it remains illegal at this time. With that being said, I can not condone her to use this and recommend refraining at this time.  Pt discussed with Dr. Elwanda Brooklyn, MD Internal Medicine Resident PGY-2 [facility] Pager: [phone] 11/08/2019 9:07 AM

## 2019-11-07 ENCOUNTER — Ambulatory Visit (INDEPENDENT_AMBULATORY_CARE_PROVIDER_SITE_OTHER): Payer: Medicare PPO | Admitting: Internal Medicine

## 2019-11-07 ENCOUNTER — Other Ambulatory Visit: Payer: Self-pay

## 2019-11-07 ENCOUNTER — Encounter: Payer: Self-pay | Admitting: Internal Medicine

## 2019-11-07 VITALS — BP 138/63 | HR 75 | Temp 98.2°F | Ht 61.0 in | Wt 120.7 lb

## 2019-11-07 DIAGNOSIS — R630 Anorexia: Secondary | ICD-10-CM | POA: Diagnosis not present

## 2019-11-07 DIAGNOSIS — I5042 Chronic combined systolic (congestive) and diastolic (congestive) heart failure: Secondary | ICD-10-CM | POA: Diagnosis not present

## 2019-11-07 DIAGNOSIS — R109 Unspecified abdominal pain: Secondary | ICD-10-CM

## 2019-11-07 DIAGNOSIS — K5900 Constipation, unspecified: Secondary | ICD-10-CM | POA: Diagnosis not present

## 2019-11-07 NOTE — Progress Notes (Signed)
CT abdomen pelvis without contrast to evaluate for right flank pain x1w and evaluate for nephrolithiasis.

## 2019-11-07 NOTE — Assessment & Plan Note (Signed)
Associated with iron supplementation. Has improved since stopping them.

## 2019-11-07 NOTE — Patient Instructions (Addendum)
For your sleep, I would like you to try some melatonin. You can purchase this over the counter. I have ordered a CT of your abdomen to look for a kidney stone which may be causing your pain.  If you develop fever, chills, nausea, vomiting or other concerning symptoms, please be evaluated sooner.

## 2019-11-07 NOTE — Assessment & Plan Note (Addendum)
Appetite loss, 20# non-intentional weight loss over 18 months, fatigue. Malignancy is of concern, especially in the setting of constipation. Last colonoscopy was in 2013 which did show some sessile and partial sessile polyps. She is denying any blood or dark stools. No history of colon cancer.  Iron panel from July 2021 reviewed and did not show iron deficiency. Suspect her fatigue may be related to poor appetite vs insomnia vs heart failure. Insomnia possibly related to age. Her PHQ9 is 0 so don't suspect depression. Plan: will have her monitor for a couple weeks to see if her appetite improves with less constipation since stopping the iron supplementation. Will have her return in 2w to further discuss this.  Further workup would likely include a colonoscopy however, given her age and other comorbidities, I will hold off on this for now.  We could consider obtaining a TSH and repeat labs at next visit however would not expect labs to change significantly as symptoms have not. Regarding her inquiry into marijuana, we discussed that [place] has not passed medical marijuana law and that it remains illegal at this time. With that being said, I can not condone her to use this and recommend refraining at this time.

## 2019-11-07 NOTE — Assessment & Plan Note (Addendum)
1w history of colicky right flank pain with radiation to RUQ. Denies fever chills or UTI symptoms. My main thought is nephrolithiasis. She does have some CVA tenderness on the right. Pyelonephritis considered but less likely given her overall well appearance and lack of systemic symptoms. Malignancy is possible in the setting of significant smoking history but would consider this less likely given the acute onset. Other consideration is MSK however she did not have tenderness to palpation of her paraspinal muscles and pain characteristics are not really consistent with this.  Plan: CT abd/pelvis without contrast to r/o nephrolithiasis. Will hold off on labs Return precautions discussed with her.

## 2019-11-09 ENCOUNTER — Ambulatory Visit (INDEPENDENT_AMBULATORY_CARE_PROVIDER_SITE_OTHER): Payer: Medicare PPO

## 2019-11-09 ENCOUNTER — Other Ambulatory Visit: Payer: Self-pay

## 2019-11-09 ENCOUNTER — Ambulatory Visit (HOSPITAL_COMMUNITY)
Admission: EM | Admit: 2019-11-09 | Discharge: 2019-11-09 | Disposition: A | Discharge status: Home / Self Care | Payer: Medicare PPO | Attending: Family Medicine | Admitting: Family Medicine

## 2019-11-09 ENCOUNTER — Encounter (HOSPITAL_COMMUNITY): Payer: Self-pay

## 2019-11-09 DIAGNOSIS — I272 Pulmonary hypertension, unspecified: Secondary | ICD-10-CM | POA: Diagnosis not present

## 2019-11-09 DIAGNOSIS — Z20822 Contact with and (suspected) exposure to covid-19: Secondary | ICD-10-CM | POA: Diagnosis not present

## 2019-11-09 DIAGNOSIS — M546 Pain in thoracic spine: Secondary | ICD-10-CM | POA: Diagnosis not present

## 2019-11-09 DIAGNOSIS — R7303 Prediabetes: Secondary | ICD-10-CM | POA: Diagnosis not present

## 2019-11-09 DIAGNOSIS — Z87891 Personal history of nicotine dependence: Secondary | ICD-10-CM | POA: Diagnosis not present

## 2019-11-09 DIAGNOSIS — I509 Heart failure, unspecified: Secondary | ICD-10-CM | POA: Insufficient documentation

## 2019-11-09 DIAGNOSIS — M549 Dorsalgia, unspecified: Secondary | ICD-10-CM | POA: Diagnosis not present

## 2019-11-09 DIAGNOSIS — J8410 Pulmonary fibrosis, unspecified: Secondary | ICD-10-CM | POA: Diagnosis not present

## 2019-11-09 DIAGNOSIS — M1990 Unspecified osteoarthritis, unspecified site: Secondary | ICD-10-CM | POA: Diagnosis not present

## 2019-11-09 DIAGNOSIS — M48061 Spinal stenosis, lumbar region without neurogenic claudication: Secondary | ICD-10-CM | POA: Diagnosis not present

## 2019-11-09 DIAGNOSIS — E785 Hyperlipidemia, unspecified: Secondary | ICD-10-CM | POA: Insufficient documentation

## 2019-11-09 DIAGNOSIS — M4186 Other forms of scoliosis, lumbar region: Secondary | ICD-10-CM | POA: Diagnosis not present

## 2019-11-09 DIAGNOSIS — R52 Pain, unspecified: Secondary | ICD-10-CM | POA: Diagnosis not present

## 2019-11-09 DIAGNOSIS — Z79899 Other long term (current) drug therapy: Secondary | ICD-10-CM | POA: Diagnosis not present

## 2019-11-09 DIAGNOSIS — G8929 Other chronic pain: Secondary | ICD-10-CM | POA: Insufficient documentation

## 2019-11-09 DIAGNOSIS — G2581 Restless legs syndrome: Secondary | ICD-10-CM | POA: Insufficient documentation

## 2019-11-09 DIAGNOSIS — R109 Unspecified abdominal pain: Secondary | ICD-10-CM

## 2019-11-09 DIAGNOSIS — I110 Hypertensive heart disease with heart failure: Secondary | ICD-10-CM | POA: Diagnosis not present

## 2019-11-09 DIAGNOSIS — E559 Vitamin D deficiency, unspecified: Secondary | ICD-10-CM | POA: Insufficient documentation

## 2019-11-09 DIAGNOSIS — M8580 Other specified disorders of bone density and structure, unspecified site: Secondary | ICD-10-CM | POA: Insufficient documentation

## 2019-11-09 DIAGNOSIS — I517 Cardiomegaly: Secondary | ICD-10-CM | POA: Diagnosis not present

## 2019-11-09 DIAGNOSIS — Z8249 Family history of ischemic heart disease and other diseases of the circulatory system: Secondary | ICD-10-CM | POA: Diagnosis not present

## 2019-11-09 DIAGNOSIS — Z791 Long term (current) use of non-steroidal anti-inflammatories (NSAID): Secondary | ICD-10-CM | POA: Diagnosis not present

## 2019-11-09 DIAGNOSIS — M5136 Other intervertebral disc degeneration, lumbar region: Secondary | ICD-10-CM | POA: Diagnosis not present

## 2019-11-09 LAB — POCT URINALYSIS DIPSTICK, ED / UC
Bilirubin Urine: NEGATIVE
Glucose, UA: NEGATIVE mg/dL
Hgb urine dipstick: NEGATIVE
Ketones, ur: NEGATIVE mg/dL
Leukocytes,Ua: NEGATIVE
Nitrite: NEGATIVE
Protein, ur: NEGATIVE mg/dL
Specific Gravity, Urine: 1.015 (ref 1.005–1.030)
Urobilinogen, UA: 0.2 mg/dL (ref 0.0–1.0)
pH: 5.5 (ref 5.0–8.0)

## 2019-11-09 MED ORDER — TRAMADOL HCL 50 MG PO TABS: 50.0000 mg | ORAL_TABLET | Freq: Three times a day (TID) | ORAL | 0 refills | Status: DC | PRN

## 2019-11-09 NOTE — ED Triage Notes (Addendum)
Pt c/o RUQ, right rib and right thoracic pain for approx 1 week, sore and achy in nature along with general weakness. States pain to right side/back increases with movements. Also reports new swelling to bilateral feet/ankles. Reports she has chronic allergy symptoms, but that her runny nose, mild productive cough have increased slightly the past several weeks. Reports clear secretions and sputum.  Denies CP, SOB, n/v/d, change in urine odor/appearance, fever, chills, sore throat, congestion, exposure to known illnesses.  +2 edema noted to bilateral ankles.

## 2019-11-09 NOTE — Progress Notes (Signed)
Internal Medicine Clinic Attending  Case discussed with Dr. Christian  At the time of the visit.  We reviewed the resident's history and exam and pertinent patient test results.  I agree with the assessment, diagnosis, and plan of care documented in the resident's note.  

## 2019-11-09 NOTE — ED Provider Notes (Signed)
[place]    CSN: [id number] Arrival date & time: 11/09/19  1403      History   Chief Complaint Chief Complaint  Patient presents with   Abdominal Pain   Back Pain    HPI Kristi Alexander is a 86 y.o. female.   Patient presenting today with almost a week of right flank pain that is intermittent but severe when pain comes on. Sometimes worse with certain movements but other times seems random. Denies fever, chills, N/V, bowel changes, hematuria, dysuria. Taking tylenol with minimal relief. Saw PCP for this 2 days ago and was told it was likely a kidney stone, CT scan of abdomen was ordered but has yet to be scheduled. This morning had a severe flare so decided to not wait until she could get the CT scan scheduled before being seen. Also notes she has had post nasal drip and a chronic cough, this is not new and is unchanged for months. States she suffers from seasonal allergies. Denies SOB, wheezes, recent sick contacts.      Past Medical History:  Diagnosis Date   Abnormality of gait 11/22/2014   Acute exacerbation of CHF (congestive heart failure) ([place]) 06/23/2018   Arthritis    Cyst of breast, right, benign solitary    Dyslipidemia    H/O: hysterectomy    Heart murmur    History of appendectomy    History of lumbosacral spine surgery    Hypertension    Inguinal hernia    Lumbar stenosis    Restless leg syndrome     Patient Active Problem List   Diagnosis Date Noted   Constipation 11/07/2019   Generalized symptoms 11/07/2019   Iron deficiency anemia 09/28/2019   Chronic left-sided back pain 06/01/2019   Right flank pain 03/18/2019   Acute kidney injury ([place]) 07/02/2018   Environmental allergies 06/10/2018   Interstitial lung disease ([place]) 04/28/2018   Impingement syndrome of left shoulder 11/10/2017   Atherosclerosis of native artery of both lower extremities with intermittent claudication ([place]) 08/08/2016   Peripheral  arterial disease ([place]) 08/04/2016   Vitamin D deficiency 11/28/2015   Osteopenia 09/16/2015   Pain in joint, shoulder region 09/16/2015   Spinal stenosis of lumbar region 01/24/2015   Essential hypertension 12/11/2014   Pulmonary hypertension (HCC)    Chronic cough    Combined congestive systolic and diastolic heart failure ([place]) 05/15/2013   Pre-diabetes 05/14/2013   Other vitamin B12 deficiency anemia 07/13/2012   Restless legs syndrome (RLS) 07/13/2012    Past Surgical History:  Procedure Laterality Date   ABDOMINAL HYSTERECTOMY     APPENDECTOMY     BOWEL RESECTION N/A 03/23/2013   Procedure: SMALL BOWEL RESECTION;  Surgeon: Gwenyth Ober, MD;  Location: [facility];  Service: General;  Laterality: N/A;   BREAST CYST EXCISION     LEFT   ESOPHAGOGASTRODUODENOSCOPY N/A 04/01/2013   Procedure: ESOPHAGOGASTRODUODENOSCOPY (EGD);  Surgeon: Gwenyth Ober, MD;  Location: [facility] ENDOSCOPY;  Service: General;  Laterality: N/A;   HERNIA REPAIR     LAPAROTOMY N/A 03/23/2013   Procedure: EXPLORATORY LAPAROTOMY;  Surgeon: Gwenyth Ober, MD;  Location: [facility];  Service: General;  Laterality: N/A;   LUMBAR LAMINECTOMY/DECOMPRESSION MICRODISCECTOMY N/A 11/10/2016   Procedure: L4-5 DECOMPRESSION FOR RECURRENT STENOSIS;  Surgeon: Marybelle Killings, MD;  Location: [facility];  Service: Orthopedics;  Laterality: N/A;   PEG PLACEMENT N/A 04/01/2013   Procedure: PERCUTANEOUS ENDOSCOPIC GASTROSTOMY (PEG) PLACEMENT;  Surgeon: Gwenyth Ober, MD;  Location:  [facility] ENDOSCOPY;  Service: General;  Laterality: N/A;   PEG TUBE REMOVAL     TRACHEOSTOMY     feinstein   TRACHEOSTOMY CLOSURE      OB History   No obstetric history on file.      Home Medications    Prior to Admission medications   Medication Sig Start Date End Date Taking? Authorizing Provider  carvedilol (COREG) 6.25 MG tablet TAKE 1 TABLET BY MOUTH TWICE DAILY WITH A MEAL 10/13/19  Yes Andrew Au, MD  isosorbide mononitrate (IMDUR) 30 MG  24 hr tablet Take 1 tablet by mouth once daily 09/09/19  Yes Iona Beard, MD  sacubitril-valsartan (ENTRESTO) 49-51 MG Take 1 tablet by mouth 2 (two) times daily. 10/18/19  Yes Madalyn Rob, MD  spironolactone (ALDACTONE) 50 MG tablet Take 1/2 (one-half) tablet by mouth once daily 08/22/19  Yes Christian, Rylee, MD  acetaminophen (TYLENOL) 500 MG tablet Take 500 mg every 6 (six) hours as needed by mouth (pain).    [provider]  atorvastatin (LIPITOR) 20 MG tablet Take 20 mg by mouth daily. 09/17/19   [provider]  benzonatate (TESSALON) 100 MG capsule Take 1 capsule (100 mg total) by mouth 2 (two) times daily as needed for cough. 04/04/19   Martyn Ehrich, NP  Calcium Carb-Cholecalciferol (CALCIUM-VITAMIN D) 600-400 MG-UNIT TABS Take 1 tablet by mouth once daily 09/09/19   Iona Beard, MD  diclofenac Sodium (VOLTAREN) 1 % GEL SMARTSIG:2 Gram(s) Topical 4 Times Daily PRN 10/12/19   [provider]  fluticasone (FLONASE) 50 MCG/ACT nasal spray Place 1 spray into both nostrils daily as needed for allergies or rhinitis. 04/04/19   Martyn Ehrich, NP  furosemide (LASIX) 40 MG tablet Take 40 mg by mouth daily. 10/21/19   [provider]  hydrOXYzine (ATARAX/VISTARIL) 25 MG tablet Take 1 tablet (25 mg total) by mouth daily as needed for itching. 04/04/19   Martyn Ehrich, NP  loratadine (CLARITIN) 10 MG tablet Take 1 tablet (10 mg total) by mouth daily as needed for allergies. 08/25/19 08/24/20  Harvie Heck, MD  Multiple Vitamin (MULTIVITAMIN WITH MINERALS) TABS tablet Take 1 tablet by mouth daily.    [provider]  traMADol (ULTRAM) 50 MG tablet Take 1 tablet (50 mg total) by mouth 3 (three) times daily as needed. 11/09/19   Volney American, PA-C    Family History Family History  Problem Relation Age of Onset   Heart disease Mother    Diabetes Mother    Heart attack Father    Cancer Brother        lung CA   Lung cancer Brother     Arthritis Sister    Osteoporosis Sister    Stomach cancer Son    Cancer Son        stomach CA   Migraines Daughter    Arthritis Daughter     Social History Social History   Tobacco Use   Smoking status: Former Smoker    Packs/day: 0.25    Years: 40.00    Pack years: 10.00    Types: Cigarettes    Quit date: 02/25/2003    Years since quitting: 16.7   Smokeless tobacco: Never Used  Scientific laboratory technician Use: Never used  Substance Use Topics   Alcohol use: Yes    Comment: About 2 drinks per night   Drug use: No     Allergies   Penicillins and Pravastatin   Review of Systems  Review of Systems PER HPI   Physical Exam Triage Vital Signs ED Triage Vitals  Enc Vitals Group     BP 11/09/19 1622 (!) 193/75     Pulse Rate 11/09/19 1622 68     Resp 11/09/19 1622 20     Temp 11/09/19 1622 98.5 F (36.9 C)     Temp Source 11/09/19 1622 Oral     SpO2 11/09/19 1622 99 %     Weight --      Height --      Head Circumference --      Peak Flow --      Pain Score 11/09/19 1624 7     Pain Loc --      Pain Edu? --      Excl. in [place]? --    No data found.  Updated Vital Signs BP (!) 193/75 (BP Location: Right Arm)    Pulse 68    Temp 98.5 F (36.9 C) (Oral)    Resp 20    SpO2 99%   Visual Acuity Right Eye Distance:   Left Eye Distance:   Bilateral Distance:    Right Eye Near:   Left Eye Near:    Bilateral Near:     Physical Exam Vitals and nursing note reviewed.  Constitutional:      Appearance: Normal appearance. She is not ill-appearing.  HENT:     Head: Atraumatic.  Eyes:     Extraocular Movements: Extraocular movements intact.     Conjunctiva/sclera: Conjunctivae normal.  Cardiovascular:     Rate and Rhythm: Normal rate and regular rhythm.     Heart sounds: Normal heart sounds.  Pulmonary:     Effort: Pulmonary effort is normal.     Breath sounds: Normal breath sounds.  Abdominal:     General: Bowel sounds are normal. There is no distension.      Palpations: Abdomen is soft.     Tenderness: There is no abdominal tenderness. There is right CVA tenderness. There is no left CVA tenderness, guarding or rebound.  Musculoskeletal:        General: Normal range of motion.     Cervical back: Normal range of motion and neck supple.  Skin:    General: Skin is warm and dry.  Neurological:     Mental Status: She is alert and oriented to person, place, and time.  Psychiatric:        Mood and Affect: Mood normal.        Thought Content: Thought content normal.        Judgment: Judgment normal.      UC Treatments / Results  Labs (all labs ordered are listed, but only abnormal results are displayed) Labs Reviewed  SARS CORONAVIRUS 2 (TAT 6-24 HRS)  POCT URINALYSIS DIPSTICK, ED / UC    EKG   Radiology DG Chest 2 View  Result Date: 11/09/2019 CLINICAL DATA:  Right flank pain X 1 week, right thoracic pain X 1 week, sore and achy. EXAM: CHEST - 2 VIEW COMPARISON:  Chest x-ray 06/23/2018. FINDINGS: Mild cardiomegaly.  Stable cardiomediastinal silhouette. Flattened hemidiaphragms and coarsened interstitial markings consistent with known emphysema as well as pulmonary fibrosis. Biapical pleural/pulmonary scarring. No focal consolidation. No over pulmonary edema. No pleural effusion. No pneumothorax. No acute osseous abnormality. Multilevel degenerative changes spine. Severe degenerative changes of the left shoulder. IMPRESSION: No active cardiopulmonary disease in a patient with chronic mild cardiomegaly, emphysema, and pulmonary fibrosis. Electronically Signed   By: Thomasena Edis  Mckinley Jewel M.D.   On: 11/09/2019 17:45   DG Abd 2 Views  Result Date: 11/09/2019 CLINICAL DATA:  Flank pain for 1 week.  Concern for kidney stones. EXAM: X-RAY ABDOMEN 2 VIEWS COMPARISON:  CT AP 01/10/2017 FINDINGS: The bowel gas pattern is normal. There is no evidence of free air. No radio-opaque calculi or other significant radiographic abnormality is seen. Scoliosis and  degenerative disc disease noted within the lumbar spine. Signs of chronic interstitial lung disease scratch set signs of chronic fibrotic interstitial lung disease noted within the imaged portions of the lower lung zones. IMPRESSION: 1. No renal calculi identified. 2. Chronic interstitial lung disease. Electronically Signed   By: Kerby Moors M.D.   On: 11/09/2019 18:18    Procedures Procedures (including critical care time)  Medications Ordered in UC Medications - No data to display  Initial Impression / Assessment and Plan / UC Course  I have reviewed the triage vital signs and the nursing notes.  Pertinent labs & imaging results that were available during my care of the patient were reviewed by me and considered in my medical decision making (see chart for details).     CXR of chest and abdomen both WNL, exam and vitals (other than BP which is quite elevated today and was discussed to make sure to take medications regularly and f/u with PCP) reassuring today. Discussed with patient prior to her abdominal x-ray that only certain types of stones would appear on the film and that a CT scan would be more comprehensive but she declines desire to go to the ER for this at this time. Wished to proceed with x-ray for now. She is well appearing and stable at this time, discussed still unclear cause of her pain though I do suspect stone. She desires to be discharged with tramadol for prn pain relief, drink fluids, and await scheduling outpatient CT abdomen pelvis ordered by PCP. Knows to report to the hospital if worsening prior to this.   Final Clinical Impressions(s) / UC Diagnoses   Final diagnoses:  Right flank pain   Discharge Instructions   None    ED Prescriptions    Medication Sig Dispense Auth. Provider   traMADol (ULTRAM) 50 MG tablet Take 1 tablet (50 mg total) by mouth 3 (three) times daily as needed. 10 tablet Volney American, [place]     I have reviewed the PDMP during  this encounter.   Merrie Roof [place], [place] 11/10/19 [phone]

## 2019-11-10 LAB — SARS CORONAVIRUS 2 (TAT 6-24 HRS): SARS Coronavirus 2: NEGATIVE

## 2019-11-15 ENCOUNTER — Other Ambulatory Visit: Payer: Self-pay | Admitting: Family Medicine

## 2019-11-15 ENCOUNTER — Other Ambulatory Visit: Payer: Self-pay

## 2019-11-15 ENCOUNTER — Ambulatory Visit (HOSPITAL_COMMUNITY)
Admission: RE | Admit: 2019-11-15 | Discharge: 2019-11-15 | Disposition: A | Discharge status: Home / Self Care | Payer: Medicare PPO | Source: Ambulatory Visit | Attending: Student in an Organized Health Care Education/Training Program | Admitting: Student in an Organized Health Care Education/Training Program

## 2019-11-15 DIAGNOSIS — R109 Unspecified abdominal pain: Secondary | ICD-10-CM | POA: Diagnosis not present

## 2019-11-15 DIAGNOSIS — I700 Atherosclerosis of aorta: Secondary | ICD-10-CM | POA: Diagnosis not present

## 2019-11-15 DIAGNOSIS — K5730 Diverticulosis of large intestine without perforation or abscess without bleeding: Secondary | ICD-10-CM | POA: Diagnosis not present

## 2019-11-15 DIAGNOSIS — D3500 Benign neoplasm of unspecified adrenal gland: Secondary | ICD-10-CM | POA: Diagnosis not present

## 2019-11-15 DIAGNOSIS — K8020 Calculus of gallbladder without cholecystitis without obstruction: Secondary | ICD-10-CM | POA: Diagnosis not present

## 2019-11-16 ENCOUNTER — Ambulatory Visit (INDEPENDENT_AMBULATORY_CARE_PROVIDER_SITE_OTHER): Payer: Medicare PPO | Admitting: Internal Medicine

## 2019-11-16 DIAGNOSIS — K5900 Constipation, unspecified: Secondary | ICD-10-CM | POA: Diagnosis not present

## 2019-11-16 DIAGNOSIS — K769 Liver disease, unspecified: Secondary | ICD-10-CM | POA: Insufficient documentation

## 2019-11-16 NOTE — Addendum Note (Signed)
Addended by: Mitzi Hansen on: 11/16/2019 11:13 AM   Modules accepted: Level of Service

## 2019-11-16 NOTE — Assessment & Plan Note (Addendum)
This telehealth visit today was for follow up of abdominal pain.  She has a long standing history of constipation.  Abdominal CT was finally performed yesterday (ordered on 9/13 visit) and showed possible fecal impaction. I discussed this with the patient who continues to have significant abdominal pain. She has been trying OTC remedies without relief.  Plan: we discussed that the next step is to attempt a suppository or enema. Will defer further oral agents at this time as she may be impacted. Patient is agreeable to trying the enema and notes that she has used these in the past. She is agreeable to calling back later today or tomorrow morning to let us know how things are progressing. If she has not had significant results, we discussed that we will have her come into the clinic for disimpaction. I also encouraged her to avoid taking the tramadol as this may further worsen constipation.

## 2019-11-16 NOTE — Assessment & Plan Note (Signed)
Abdominal CT ordered during her last OV with me on 9/13 because she was having right flank pain and I wanted to r/o nephrolithiasis. CT was negative for nephrolithiasis but revealed this 4mm low density lesion in her left lower lobe of her liver.  I discussed these results with Kristi Alexander today as well as recommendation for further evaluation which she is agreeable to Plan --MRI abdomen pelvis ordered.

## 2019-11-16 NOTE — Progress Notes (Signed)
[facility] Internal Medicine Residency Telephone Encounter Continuity Care Appointment  CC: abdominal pain f/u, constipation/inpaction, CT review   HPI:  This telephone encounter was created for [name] on 11/16/2019 for the above issues.  I recently saw Jearlean in the [facility] on 11/07/19 for evaluation of 2w history of right flank pain as well as general constitutional symptoms. I ordered an abd/pelvis CT at that visit to rule out nephrolithiasis.  She presented to the urgent care on 9/15 for similar symptoms. Workup, including chest Xray, Abd xray, and UA were unremarkable and pt was given tramadol for pain. She was finally able to get the a/p CT yesterday, 9/21, which revealed a 17mm liver lesion as well as significant stool burden with possible fecal impaction.  Pt notes that she has continued to have abdominal pain since that visit and that she almost went to the ER yesterday. She notes she is feeling constipated. The abdominal pain is mostly lower abd now. She has been trying OTC medications for the constipation without significant improvement. She notes that she has had small bowel movement but continues to have pain. We discussed the results of the CT in regards to the possible fecal impaction. We also discussed necessary workup for the liver lesion.   Past Medical History:  Past Medical History:  Diagnosis Date  . Abnormality of gait 11/22/2014  . Acute exacerbation of CHF (congestive heart failure) ([place]) 06/23/2018  . Arthritis   . Cyst of breast, right, benign solitary   . Dyslipidemia   . H/O: hysterectomy   . Heart murmur   . History of appendectomy   . History of lumbosacral spine surgery   . Hypertension   . Inguinal hernia   . Lumbar stenosis   . Restless leg syndrome       ROS:  Review of Systems  Constitutional: Positive for malaise/fatigue. Negative for fever.  Gastrointestinal: Positive for abdominal pain and constipation. Negative for nausea and  vomiting.  Genitourinary: Positive for flank pain. Negative for dysuria.     Assessment / Plan / Recommendations:  Problem List Items Addressed This Visit      Other   Constipation    This telehealth visit today was for follow up of abdominal pain.  She has a long standing history of constipation.  Abdominal CT was finally performed yesterday (ordered on 9/13 visit) and showed possible fecal impaction. I discussed this with the patient who continues to have significant abdominal pain. She has been trying OTC remedies without relief.  Plan: we discussed that the next step is to attempt a suppository or enema. Will defer further oral agents at this time as she may be impacted. Patient is agreeable to trying the enema and notes that she has used these in the past. She is agreeable to calling back later today or tomorrow morning to let us know how things are progressing. If she has not had significant results, we discussed that we will have her come into the clinic for disimpaction. I also encouraged her to avoid taking the tramadol as this may further worsen constipation.      Lesion of left lobe of liver    Abdominal CT ordered during her last OV with me on 9/13 because she was having right flank pain and I wanted to r/o nephrolithiasis. CT was negative for nephrolithiasis but revealed this 71mm low density lesion in her left lower lobe of her liver.  I discussed these results with Devin today as well  as recommendation for further evaluation which she is agreeable to Plan --MRI abdomen pelvis ordered.        As always, pt is advised that if symptoms worsen or new symptoms arise, they should go to an urgent care facility or to to ER for further evaluation.   Consent and Medical Decision Making:   Patient discussed with Dr. Everett Graff  This is a telephone encounter between Kristi Alexander and [employer] on 11/16/2019 for abdominal pain. The visit was conducted with the patient located  at home and [employer] at [facility]. The patient's identity was confirmed using their DOB and current address. The patient has consented to being evaluated through a telephone encounter and understands the associated risks (an examination cannot be done and the patient may need to come in for an appointment) / benefits (allows the patient to remain at home, decreasing exposure to coronavirus). I personally spent 20 minutes on medical discussion.    Mitzi Hansen, MD Internal Medicine Resident PGY-2 [facility] Res[facility] Pager: [phone] 11/16/2019 10:23 AM

## 2019-11-17 ENCOUNTER — Telehealth: Payer: Self-pay

## 2019-11-17 NOTE — Telephone Encounter (Signed)
Thank you for trying.

## 2019-11-17 NOTE — Telephone Encounter (Signed)
Mitzi Hansen, MD  P Imp Triage Nurse Pool Hey! Would you mind following up with this patient for me? I had a televisit with her yesterday for abdominal pain and possible fecal impaction. I told her to do an enema and to let us know how she is feeling but I havn't seen anything in my inbox yet.   Thanks!    TC to patient on home number, no answer, no VM. TC to patient on mobile number, VM was not self-identifying and RN unable to leave message. SChaplin, RN,BSN

## 2019-11-18 NOTE — Telephone Encounter (Signed)
Great to hear! Thanks ladies!

## 2019-11-18 NOTE — Telephone Encounter (Signed)
Received TC from patient, she is asking for an update on the MRI.  States she is still having the back and right sided pain.  Per chart review, looks like MRI was approved, informed patient that typically someone from imaging department will call to schedule.  Patient states she is having some phone problems.  Will forward to [place] and ask if she can check on MRI and assist patient with scheduling.  Also, patient states she had good results with enema and no longer feels constipated.  Will forward to MD. Thank you, SChaplin, RN,BSN

## 2019-11-18 NOTE — Telephone Encounter (Signed)
Spoke with the pt's daughter(Denise). [employer] Authorization was approved yesterday 11/17/2019 for the following:   imaging was responsible for scheduling this patient:   [facility] imaging has contacted the pt and sch an appt for 12/12/2019 @ 4 pm.   [facility] Confirmation Number for Exam Scheduling Attention: Pierz Confirmation Date: Nov 17 2019 - Dec 17 2019 Member ID Number: Patient Name: Patient Phone Number: Patient date of Birth: [id number] Kristi Alexander [id number] June 14, 1925 Ordering Physician: Physician Phone: Aldine Contes [phone] Facility: Facility Phone: [facility] [phone] [place] Number: [id number] Appointment Date: 11/17/2019 Procedure: 03128 MRI ABDOMEN WO & WDYE, 74181 MRI ABDOMEN WO DYE, 11886 MRI ABDOMEN WDYE Diagnosis: K76.9 Liver disease, unspecified

## 2019-11-19 ENCOUNTER — Other Ambulatory Visit: Payer: Self-pay | Admitting: Primary Care

## 2019-11-21 ENCOUNTER — Other Ambulatory Visit: Payer: Self-pay

## 2019-11-21 ENCOUNTER — Ambulatory Visit (INDEPENDENT_AMBULATORY_CARE_PROVIDER_SITE_OTHER): Payer: Medicare PPO | Admitting: Internal Medicine

## 2019-11-21 ENCOUNTER — Telehealth: Payer: Self-pay

## 2019-11-21 DIAGNOSIS — R1011 Right upper quadrant pain: Secondary | ICD-10-CM

## 2019-11-21 MED ORDER — TRAMADOL HCL 50 MG PO TABS
50.0000 mg | ORAL_TABLET | Freq: Three times a day (TID) | ORAL | 0 refills | Status: AC | PRN
Start: 2019-11-21 — End: 2019-11-26

## 2019-11-21 NOTE — Telephone Encounter (Signed)
Can you place her on the schedule for a tele-health visit for me today?

## 2019-11-21 NOTE — Telephone Encounter (Signed)
Requesting to speak with a nurse about meds.

## 2019-11-21 NOTE — Telephone Encounter (Signed)
Returned call to patient's daughter, [name]. States patient still having a lot of right flank discomfort. Patient rating it 8-10/10 at present. Denies fever, pain, burning, or itching with urination. Denies urinary frequency or urgency. States she has an MRI scheduled for 12/12/2019 but wants to know what she should do in meantime. Was given 10 tabs of tramadol by UC on 11/09/2019 which did provide relief. Please advise. Hubbard Hartshorn, BSN, RN-BC

## 2019-11-21 NOTE — Assessment & Plan Note (Addendum)
This telehealth visit was conducted on 9/27 after patient called the [facility] seeking further management of her right flank pain. She said that it was about an 8 out of 10 today.  Patient notes that the right flank pain is colicky in nature.  She also notes that she is unable to lift her right arm because of shoulder pain.  She still denies any nausea, vomiting, fevers or chills.  She notes that she has been having regular bowel movements since her last discussion.  Last bowel movement was this morning.  She notes that she is been unable to eat anything because of poor appetite.  She is not able to delineate whether this is from pain with eating versus just not being hungry. Unfortunately, she is not able to get the MRI that I ordered last week, for evaluation of the liver lesion noted on CT, until 10/18 Assessment: Nephrolithiasis less likely given the absence of stones present on CT.  My primary differential diagnosis at this time would be symptomatic cholelithiasis Plan --discussed with patient and her daughter that I would recommend an in person evaluation tomorrow afternoon.  We will plan to obtain a CBC, CMP at that visit to evaluate for any signs of infection as well as transaminitis and renal function since she endorses poor oral intake. appointment scheduled for 1545 tomorrow afternoon --Right upper quadrant ultrasound ordered for evaluation of inflammatory changes around the gallbladder --Referral placed to general surgery for symptomatic cholelithiasis --Continue plans for MRI for follow-up evaluation of the liver lesion noted on CT

## 2019-11-21 NOTE — Progress Notes (Signed)
[facility] Internal Medicine Residency Telephone Encounter Continuity Care Appointment   CC: right flank pain    HPI:  This telephone encounter was created for [name] on 11/21/2019 for persistent right flank pain.  9/13: Initially evaluated the patient in the office on 9/13.  At that time, the right flank pain had been going on for about a week.  She denied exhibit any signs or symptoms of infection at that time so I obtained an abdominal CT to rule out nephrolithiasis as a source for pain.  The abdominal CT did not show nephrolithiasis however did reveal a 15 mm left hepatic low-density lesion that was concerning for a possible neoplasm.  The CT had also revealed a large gallstone within the fundus of the bladder.  She had also been noted to have large amount of stool in her rectum that was concerning for an impaction.  9/15: She presented to the emergency department with right flank pain.  Work-up in the ED was essentially unremarkable and she was discharged with tramadol.  9/22: Patient called the [facility] seeking further management of her right flank pain.  Patient had still been unable to have a bowel movement so we discussed management of that and I was thinking that that might be the source of the right flank pain.  9/27: Patient called the [facility] seeking further management of the persistent right flank pain.  She said that it was about an 8 out of 10 today.  Patient notes that the right flank pain is colicky in nature.  She also notes that she is unable to lift her right arm because of shoulder pain.  She still denies any nausea, vomiting, fevers or chills.  She notes that she has been having regular bowel movements since her last discussion.  Last bowel movement was this morning.  She notes that she is been unable to eat anything because of poor appetite.  She is not able to delineate whether this is from pain with eating versus just not being hungry.     Past Medical History:    Past Medical History:  Diagnosis Date  . Abnormality of gait 11/22/2014  . Acute exacerbation of CHF (congestive heart failure) ([place]) 06/23/2018  . Arthritis   . Cyst of breast, right, benign solitary   . Dyslipidemia   . H/O: hysterectomy   . Heart murmur   . History of appendectomy   . History of lumbosacral spine surgery   . Hypertension   . Inguinal hernia   . Lumbar stenosis   . Restless leg syndrome      ROS:  Review of Systems  Constitutional: Positive for malaise/fatigue and weight loss. Negative for chills and fever.  Cardiovascular: Negative.   Gastrointestinal: Positive for abdominal pain. Negative for constipation, diarrhea, nausea and vomiting.      Assessment / Plan / Recommendations:  Problem List Items Addressed This Visit      Other   Abdominal pain - Primary    This telehealth visit was conducted on 9/27 after patient called the [facility] seeking further management of her right flank pain. She said that it was about an 8 out of 10 today.  Patient notes that the right flank pain is colicky in nature.  She also notes that she is unable to lift her right arm because of shoulder pain.  She still denies any nausea, vomiting, fevers or chills.  She notes that she has been having regular bowel movements since her last discussion.  Last bowel movement was this morning.  She notes that she is been unable to eat anything because of poor appetite.  She is not able to delineate whether this is from pain with eating versus just not being hungry. Unfortunately, she is not able to get the MRI that I ordered last week, for evaluation of the liver lesion noted on CT, until 10/18 Assessment: Nephrolithiasis less likely given the absence of stones present on CT.  My primary differential diagnosis at this time would be symptomatic cholelithiasis Plan --discussed with patient and her daughter that I would recommend an in person evaluation tomorrow afternoon.  We will plan to obtain a CBC,  CMP at that visit to evaluate for any signs of infection as well as transaminitis and renal function since she endorses poor oral intake --Right upper quadrant ultrasound ordered for evaluation of inflammatory changes around the gallbladder --Referral placed to general surgery for symptomatic cholelithiasis --Continue plans for MRI for follow-up evaluation of the liver lesion noted on CT        Relevant Orders   Ambulatory referral to General Surgery   US Abdomen Limited RUQ      As always, pt is advised that if symptoms worsen or new symptoms arise, they should go to an urgent care facility or to to ER for further evaluation.   Consent and Medical Decision Making:   Patient discussed with Dr. Jimmye Norman  This is a telephone encounter between Kristi Alexander and Kristi Alexander on 11/21/2019 for right flank pain. The visit was conducted with the patient located at home and [employer] at [facility]. The patient's identity was confirmed using their DOB and current address. The patient has consented to being evaluated through a telephone encounter and understands the associated risks (an examination cannot be done and the patient may need to come in for an appointment) / benefits (allows the patient to remain at home, decreasing exposure to coronavirus). I personally spent 20 minutes on medical discussion.     Mitzi Hansen, MD Internal Medicine Resident PGY-2 [facility] Pager: [phone] 11/21/2019 5:28 PM

## 2019-11-22 ENCOUNTER — Ambulatory Visit (HOSPITAL_COMMUNITY): Payer: Medicare PPO

## 2019-11-22 ENCOUNTER — Ambulatory Visit (INDEPENDENT_AMBULATORY_CARE_PROVIDER_SITE_OTHER): Payer: Medicare PPO | Admitting: Internal Medicine

## 2019-11-22 VITALS — BP 131/65 | HR 73 | Temp 98.7°F | Ht 61.0 in | Wt 115.8 lb

## 2019-11-22 DIAGNOSIS — K769 Liver disease, unspecified: Secondary | ICD-10-CM | POA: Diagnosis not present

## 2019-11-22 DIAGNOSIS — R1011 Right upper quadrant pain: Secondary | ICD-10-CM | POA: Diagnosis not present

## 2019-11-22 LAB — COMPREHENSIVE METABOLIC PANEL
ALT: 13 U/L (ref 0–44)
AST: 25 U/L (ref 15–41)
Albumin: 3.3 g/dL — ABNORMAL LOW (ref 3.5–5.0)
Alkaline Phosphatase: 92 U/L (ref 38–126)
Anion gap: 9 (ref 5–15)
BUN: 16 mg/dL (ref 8–23)
CO2: 26 mmol/L (ref 22–32)
Calcium: 9.9 mg/dL (ref 8.9–10.3)
Chloride: 103 mmol/L (ref 98–111)
Creatinine, Ser: 1.12 mg/dL — ABNORMAL HIGH (ref 0.44–1.00)
GFR calc Af Amer: 49 mL/min — ABNORMAL LOW (ref >60)
GFR calc non Af Amer: 43 mL/min — ABNORMAL LOW (ref >60)
Glucose, Bld: 107 mg/dL — ABNORMAL HIGH (ref 70–99)
Potassium: 4 mmol/L (ref 3.5–5.1)
Sodium: 138 mmol/L (ref 135–145)
Total Bilirubin: 1 mg/dL (ref 0.3–1.2)
Total Protein: 6.5 g/dL (ref 6.5–8.1)

## 2019-11-22 LAB — CBC WITH DIFFERENTIAL/PLATELET
Abs Immature Granulocytes: 0.02 10*3/uL (ref 0.00–0.07)
Basophils Absolute: 0 10*3/uL (ref 0.0–0.1)
Basophils Relative: 1 %
Eosinophils Absolute: 0.2 10*3/uL (ref 0.0–0.5)
Eosinophils Relative: 3 %
HCT: 35.2 % — ABNORMAL LOW (ref 36.0–46.0)
Hemoglobin: 11.2 g/dL — ABNORMAL LOW (ref 12.0–15.0)
Immature Granulocytes: 0 %
Lymphocytes Relative: 9 %
Lymphs Abs: 0.4 10*3/uL — ABNORMAL LOW (ref 0.7–4.0)
MCH: 29.9 pg (ref 26.0–34.0)
MCHC: 31.8 g/dL (ref 30.0–36.0)
MCV: 93.9 fL (ref 80.0–100.0)
Monocytes Absolute: 0.5 10*3/uL (ref 0.1–1.0)
Monocytes Relative: 10 %
Neutro Abs: 3.9 10*3/uL (ref 1.7–7.7)
Neutrophils Relative %: 77 %
Platelets: 278 10*3/uL (ref 150–400)
RBC: 3.75 MIL/uL — ABNORMAL LOW (ref 3.87–5.11)
RDW: 15.5 % (ref 11.5–15.5)
WBC: 5 10*3/uL (ref 4.0–10.5)
nRBC: 0 % (ref 0.0–0.2)

## 2019-11-22 LAB — LIPASE, BLOOD: Lipase: 32 U/L (ref 11–51)

## 2019-11-22 NOTE — Progress Notes (Signed)
DOS 11/21/19:  This telephone visit was discussed in detail with Dr. Darrick Meigs and I agree with her documentation.  Kristi Alexander now appears to have had two problems; the severe constipation with impaction has been relieved, though the RUQ pain is worsening and is suspicious for GB etiology.  She will come in for a visit tomorrow and RUQ [place] will be repeated.  She is losing weight, has poor appetite, and a general surgeon referral is appropriate.

## 2019-11-22 NOTE — Progress Notes (Signed)
Office Visit   Patient ID: Kristi Alexander, female    DOB: 06/12/1925, 86 y.o.   MRN: [id number]  Subjective:  CC: Abdominal pain  HPI 86 y.o. presents today for follow-up of her right upper quadrant/flank pain.  9/13: Initial office visit.    Presented for 1 week history of right flank pain.   Abdominal CT ordered.   9/15: She presented to the emergency department with right flank pain.  Work-up in the ED was essentially unremarkable and she was discharged with tramadol.  9/21: Abdominal CT performed.  Large gallstone noted in the fundus of the gallbladder.  15 mm left hepatic low-density lesion noted stable left adrenal adenoma.  Large amount of stool noted in the rectum concerning for impaction  9/22: Telehealth visit to discuss CT results.  Patient had still been unable to have a bowel movement so we discussed management of that. Abdominal MRI ordered for further evaluation of liver lesions.  9/27: Telehealth visit to discuss persistent pain.  8 out of 10 today. Pain described as RUQ with radiation to her shoulder.  now having daily BMs. Poor appetite persists.   9/28: Office visit.  Due to the persistence and degree of pain, I felt it appropriate to have her come into the clinic so I can perform exam. In the office today, she notes that her abdominal pain is a little better today.  She is taking 2 tramadol since I sent in the prescription on 927.    She continues to have a very poor appetite saying that she just does not feel like eating but denies pain with eating.  She notes that she has been having adequate oral fluid intake.  9/29: Received a stat result call regarding the abdominal ultrasound performed today.  It is noted that she has a 2.3 cm stone in the gallbladder without any evidence of obstruction.  Her CBD is 5.7.  They also note 2 hepatic lesions.  The first 1 is a central left hepatic mixed echogenicity lesion measuring 3.5 x 2.4 x 3.9 cm that is noted to not have the  typical appearance of a hemangioma.  The second lesion is adjacent to the first lesion and measures 1.1 x 0.9 x 1.1 cm.  She is still awaiting an MRI that I ordered on 9/22 and is scheduled for 10/18.     ACTIVE MEDICATIONS   Current Outpatient Medications on File Prior to Visit  Medication Sig Dispense Refill  . acetaminophen (TYLENOL) 500 MG tablet Take 500 mg every 6 (six) hours as needed by mouth (pain).    Marland Kitchen atorvastatin (LIPITOR) 20 MG tablet Take 20 mg by mouth daily.    . benzonatate (TESSALON) 100 MG capsule Take 1 capsule (100 mg total) by mouth 2 (two) times daily as needed for cough. 60 capsule 2  . Calcium Carb-Cholecalciferol (CALCIUM-VITAMIN D) 600-400 MG-UNIT TABS Take 1 tablet by mouth once daily 90 tablet 1  . carvedilol (COREG) 6.25 MG tablet TAKE 1 TABLET BY MOUTH TWICE DAILY WITH A MEAL 60 tablet 2  . diclofenac Sodium (VOLTAREN) 1 % GEL SMARTSIG:2 Gram(s) Topical 4 Times Daily PRN    . fluticasone (FLONASE) 50 MCG/ACT nasal spray Place 1 spray into both nostrils daily as needed for allergies or rhinitis. 16 g 2  . furosemide (LASIX) 40 MG tablet Take 40 mg by mouth daily.    . hydrOXYzine (ATARAX/VISTARIL) 25 MG tablet TAKE 1 TABLET BY MOUTH ONCE DAILY AS NEEDED FOR  ITCHING 90  tablet 0  . isosorbide mononitrate (IMDUR) 30 MG 24 hr tablet Take 1 tablet by mouth once daily 90 tablet 1  . loratadine (CLARITIN) 10 MG tablet Take 1 tablet (10 mg total) by mouth daily as needed for allergies. 30 tablet 0  . Multiple Vitamin (MULTIVITAMIN WITH MINERALS) TABS tablet Take 1 tablet by mouth daily.    . sacubitril-valsartan (ENTRESTO) 49-51 MG Take 1 tablet by mouth 2 (two) times daily. 60 tablet 3  . spironolactone (ALDACTONE) 50 MG tablet Take 1/2 (one-half) tablet by mouth once daily 90 tablet 0  . traMADol (ULTRAM) 50 MG tablet Take 1 tablet (50 mg total) by mouth 3 (three) times daily as needed for up to 5 days. 15 tablet 0   No current facility-administered medications on  file prior to visit.    ROS  Review of Systems  Constitutional: Positive for appetite change, fatigue and unexpected weight change. Negative for chills and fever.  Cardiovascular: Negative for chest pain and leg swelling.  Gastrointestinal: Positive for abdominal pain. Negative for abdominal distention, blood in stool, constipation, diarrhea, nausea and vomiting.  Genitourinary: Positive for flank pain. Negative for difficulty urinating, dysuria and frequency.  Musculoskeletal: Positive for arthralgias.  Neurological: Negative for dizziness, syncope, light-headedness and headaches.    Objective:   BP 131/65 (BP Location: Right Arm, Patient Position: Sitting, Cuff Size: Small)   Pulse 73   Temp 98.7 F (37.1 C) (Oral)   Ht 5\' 1"  (1.549 m)   Wt 115 lb 12.8 oz (52.5 kg)   SpO2 100%   BMI 21.88 kg/m  Wt Readings from Last 3 Encounters:  11/22/19 115 lb 12.8 oz (52.5 kg)  11/07/19 120 lb 11.2 oz (54.7 kg)  08/25/19 124 lb 4.8 oz (56.4 kg)   BP Readings from Last 3 Encounters:  11/22/19 131/65  11/09/19 (!) 193/75  11/07/19 138/63   Physical Exam Constitutional:      General: She is not in acute distress.    Appearance: Normal appearance. She is not toxic-appearing.  Cardiovascular:     Rate and Rhythm: Normal rate and regular rhythm.  Pulmonary:     Effort: Pulmonary effort is normal.     Breath sounds: Normal breath sounds.  Abdominal:     Comments: Nondistended. Bowel sounds normoactive. abd soft. No appreciable masses. Mild tenderness of the RUQ and epigastric regions. Negative murphy's sign.   Musculoskeletal:     Right lower leg: No edema.     Left lower leg: No edema.  Skin:    Coloration: Skin is not jaundiced or pale.     Findings: No bruising or rash.  Neurological:     Mental Status: She is oriented to person, place, and time.     Health Maintenance:   Health Maintenance  Topic Date Due  . URINE MICROALBUMIN  Never done  . INFLUENZA VACCINE   09/25/2019  . TETANUS/TDAP  01/07/2027  . DEXA SCAN  Completed  . COVID-19 Vaccine  Completed  . PNA vac Low Risk Adult  Completed     Assessment & Plan:   Problem List Items Addressed This Visit      Other   Abdominal pain - Primary    This office visit was for further evaluation of pt's abdominal pain so that I could perform a physical exam again. Abdominal pain was less today. Pt notes having taken 2 tramadol since picking up the prescription yesterday. We revisited the discussion of making sure that she continues to have  bowel movements as opioids can slow this down. Her physical exam remains unrevealing. She remains afebrile and hemodynamically stable. She only has mild tenderness in her RUQ with negative murphy's sign. She still does not appear jaundiced or pale. Of note, she is down about 10# from July. My primary differentials are symptomatic cholelithiasis vs malignancy. The RUQ pain with radiation to her shoulder would be consistent with a biliary source however I have asked her several times if eating makes the pain worse, and she continues to deny this. As noted on her CT, she does have a large stone present in the fundus of the gallbladder however I would expect some pain to correlate with eating. The other concern is the hepatic lesion that is noted on CT. If this involves gleason's capsule, then this could be a source of pain.  I have also considered other sources of abdominal pain including pancreatitis, gastric ulcers, nephrolithiasis, and GI. Her history and exam do not really correlate with any of these either. Plan --will obtain a CBC, CMP and lipase to evaluate for acute drop in hgb, leukocytosis, liver function, renal function and pancreatitis respectively. --plan to proceed with RUQ [place] tomorrow for further evaluation --plan to proceed with general surgery referral  Addendum #1: no significant changes or abnormalities in labs. She has a normocytic anemia that is new from  April of 2020 but that is stable from July 2021. Hgb today is 11.3. White count is wnl at 5. LFTs are within normal limits. Renal function is at baseline. lipase is normal.   Addendum #2: 11/23/19. Received a call from radiology earlier today regarding the results of her RUQ [place]. They note a stone in the fundus of the gallbladder measuring 2.3cm that is non-obstructive. CBD is 5.5mm. This ultrasound does, unfortunately, note 2 liver lesions, rather than one.  -Central left hepatic mixed echogenicity lesion noted measuring 3.5 x 2.4 x 3.9 cm. This remains indeterminate by ultrasound but does not have the typical appearance of hemangioma. -Additional adjacent left hepatic hypoechoic lesion measures 1.1 x 0.9 x 1.1 cm.  Plan --office staff discussed need to move up MRI date with radiology. MRI previously scheduled for 10/18, however I do feel that, given her significant pain, this needs to be done sooner. MRI now scheduled for tomorrow, 9/30.       Relevant Orders   CBC with Diff (Completed)   CMP w Anion Gap (STAT/Sunquest-performed on-site) (Completed)   Lipase, blood (STAT) (Completed)   Lesion of left lobe of liver    9/29 RUQ [place] results> -Central left hepatic mixed echogenicity lesion noted measuring 3.5 x 2.4 x 3.9 cm. This remains indeterminate by ultrasound but does not have the typical appearance of hemangioma. -Additional adjacent left hepatic hypoechoic lesion measures 1.1 x 0.9 x 1.1 cm.  Plan --MRI moved up to tomorrow           Pt discussed with Dr. Marty Heck, MD Internal Medicine Resident PGY-2 [facility] Pager: [phone] 11/23/2019 8:21 PM

## 2019-11-23 ENCOUNTER — Ambulatory Visit (HOSPITAL_COMMUNITY)
Admission: RE | Admit: 2019-11-23 | Discharge: 2019-11-23 | Disposition: A | Discharge status: Home / Self Care | Payer: Medicare PPO | Source: Ambulatory Visit | Attending: Internal Medicine | Admitting: Internal Medicine

## 2019-11-23 ENCOUNTER — Other Ambulatory Visit: Payer: Self-pay

## 2019-11-23 DIAGNOSIS — R1011 Right upper quadrant pain: Secondary | ICD-10-CM | POA: Diagnosis not present

## 2019-11-23 DIAGNOSIS — K8020 Calculus of gallbladder without cholecystitis without obstruction: Secondary | ICD-10-CM | POA: Diagnosis not present

## 2019-11-23 NOTE — Assessment & Plan Note (Signed)
9/29 RUQ [place] results> -Central left hepatic mixed echogenicity lesion noted measuring 3.5 x 2.4 x 3.9 cm. This remains indeterminate by ultrasound but does not have the typical appearance of hemangioma. -Additional adjacent left hepatic hypoechoic lesion measures 1.1 x 0.9 x 1.1 cm.  Plan --MRI moved up to tomorrow

## 2019-11-23 NOTE — Assessment & Plan Note (Signed)
This office visit was for further evaluation of pt's abdominal pain so that I could perform a physical exam again. Abdominal pain was less today. Pt notes having taken 2 tramadol since picking up the prescription yesterday. We revisited the discussion of making sure that she continues to have bowel movements as opioids can slow this down. Her physical exam remains unrevealing. She remains afebrile and hemodynamically stable. She only has mild tenderness in her RUQ with negative murphy's sign. She still does not appear jaundiced or pale. Of note, she is down about 10# from July. My primary differentials are symptomatic cholelithiasis vs malignancy. The RUQ pain with radiation to her shoulder would be consistent with a biliary source however I have asked her several times if eating makes the pain worse, and she continues to deny this. As noted on her CT, she does have a large stone present in the fundus of the gallbladder however I would expect some pain to correlate with eating. The other concern is the hepatic lesion that is noted on CT. If this involves gleason's capsule, then this could be a source of pain.  I have also considered other sources of abdominal pain including pancreatitis, gastric ulcers, nephrolithiasis, and GI. Her history and exam do not really correlate with any of these either. Plan --will obtain a CBC, CMP and lipase to evaluate for acute drop in hgb, leukocytosis, liver function, renal function and pancreatitis respectively. --plan to proceed with RUQ [place] tomorrow for further evaluation --plan to proceed with general surgery referral  Addendum #1: no significant changes or abnormalities in labs. She has a normocytic anemia that is new from April of 2020 but that is stable from July 2021. Hgb today is 11.3. White count is wnl at 5. LFTs are within normal limits. Renal function is at baseline. lipase is normal.   Addendum #2: 11/23/19. Received a call from radiology earlier today  regarding the results of her RUQ [place]. They note a stone in the fundus of the gallbladder measuring 2.3cm that is non-obstructive. CBD is 5.69mm. This ultrasound does, unfortunately, note 2 liver lesions, rather than one.  -Central left hepatic mixed echogenicity lesion noted measuring 3.5 x 2.4 x 3.9 cm. This remains indeterminate by ultrasound but does not have the typical appearance of hemangioma. -Additional adjacent left hepatic hypoechoic lesion measures 1.1 x 0.9 x 1.1 cm.  Plan --office staff discussed need to move up MRI date with radiology. MRI previously scheduled for 10/18, however I do feel that, given her significant pain, this needs to be done sooner. MRI now scheduled for tomorrow, 9/30.

## 2019-11-24 ENCOUNTER — Other Ambulatory Visit: Payer: Self-pay | Admitting: Internal Medicine

## 2019-11-24 ENCOUNTER — Ambulatory Visit
Admission: RE | Admit: 2019-11-24 | Discharge: 2019-11-24 | Disposition: A | Discharge status: Home / Self Care | Payer: Medicare PPO | Source: Ambulatory Visit | Attending: Internal Medicine | Admitting: Internal Medicine

## 2019-11-24 DIAGNOSIS — K769 Liver disease, unspecified: Secondary | ICD-10-CM

## 2019-11-24 DIAGNOSIS — D3502 Benign neoplasm of left adrenal gland: Secondary | ICD-10-CM | POA: Diagnosis not present

## 2019-11-24 MED ORDER — GADOBENATE DIMEGLUMINE 529 MG/ML IV SOLN
10.0000 mL | Freq: Once | INTRAVENOUS | Status: AC | PRN
  Administered 2019-11-24: 10 mL via INTRAVENOUS

## 2019-11-24 NOTE — Progress Notes (Addendum)
MRI abdomen results:  Hepatobiliary: 2 tiny cysts are seen in the inferior right hepatic lobe. Three adjacent masses are seen in the left hepatic lobe, which measure 3.0 cm, 2.2 cm, and 1.7 cm in maximum diameter. These masses all have similar characteristics including mild T2 hyperintensity and mild central heterogeneous contrast enhancement seen on subtraction imaging. These have nonspecific characteristics, but are new since 2018. Hepatic metastases cannot be excluded. 2 cm gallstone is noted, however there is no evidence of cholecystitis or biliary ductal dilatation. Recommend correlation with tumor markers, and consider tissue sampling and/or PET-CT.  Plan: Discussed results with Tonia and her daughter, [name]. Jackelin would like to move forward with further workup. Will order CEA, AFP, and CA19-9. Will place referral to oncology as well. All questions were answered and pt will present to the clinic tomorrow for a lab draw. Plan discussed with Dr. Jimmye Norman and coordinated with office RN, Zigmund Daniel, and office RN Maime.  Mitzi Hansen, MD Internal Medicine Resident PGY-2 [facility] Pager: [phone] 11/24/2019 4:57 PM

## 2019-11-24 NOTE — Progress Notes (Signed)
DOS 11/23/19:  Internal Medicine Clinic Attending  Case discussed with Dr. Darrick Meigs  At the time of the visit.  We reviewed the resident's history and exam and pertinent patient test results.  I agree with the assessment, diagnosis, and plan of care documented in the resident's note.

## 2019-11-24 NOTE — Progress Notes (Signed)
DOS 11/16/19:  Internal Medicine Clinic Attending  Case discussed with Dr. Darrick Meigs  At the time of the visit.  We reviewed the resident's history and exam and pertinent patient test results.  I agree with the assessment, diagnosis, and plan of care documented in the resident's note.

## 2019-11-25 ENCOUNTER — Other Ambulatory Visit: Payer: Medicare PPO

## 2019-11-25 DIAGNOSIS — K769 Liver disease, unspecified: Secondary | ICD-10-CM

## 2019-11-25 NOTE — Addendum Note (Signed)
Addended by: Mitzi Hansen on: 11/25/2019 10:31 AM   Modules accepted: Orders

## 2019-11-26 LAB — AFP TUMOR MARKER: AFP, Serum, Tumor Marker: 4.3 ng/mL (ref 0.0–8.3)

## 2019-11-26 LAB — CEA: CEA: 17 ng/mL — ABNORMAL HIGH (ref 0.0–4.7)

## 2019-11-26 LAB — CANCER ANTIGEN 19-9: CA 19-9: <2 U/mL (ref 0–35)

## 2019-11-29 ENCOUNTER — Telehealth: Payer: Self-pay | Admitting: Internal Medicine

## 2019-11-29 NOTE — Telephone Encounter (Signed)
Called and relayed results of AFP, Ca19-9, and CEA.  Oncology appt arranged with Dr. Burr Medico and Gloriajean Dell, NP at the cancer center.   Pt notes that she has still not received a call from the surgery clinic. She notes that right abdominal and shoulder pain have significantly improved and described as "minimal".  She notes feeling tired and weak today and relates this to her poor appetite. She reports adequate fluid intake. BP this morning 150/80s. Denies any focal weakness and just feels that she needs to rest. Encouraged her to reach out to our clinic if she starts to feel worse, to see if she could be seen today.  I relayed that I will continue to try to see what is going on with the surgery referral. Will continue to follow along after oncology appt tomorrow. She is encouraged to reach out for any further questions/concerns. All questions were answered.  Mitzi Hansen, MD Internal Medicine Resident PGY-2 [facility] Pager: [phone] 11/29/2019 11:53 AM

## 2019-11-29 NOTE — Progress Notes (Signed)
Let voice message for patient's daughter to return my call regarding referral we received from Dr. Dareen Piano for liver lesions.  I asked her to call me back on direct line so we may coordinate a consult appointment.

## 2019-11-29 NOTE — Progress Notes (Signed)
Patient's daughter Denies calls back agreed to new consult appointment tomorrow 10/6 at 1:45 to arrive no later than 1:30 for registration.  I explained she will be seen by [name] NP and Dr. Burr Medico.  She is aware of our location and I explained my role as nurse navigator.

## 2019-11-30 ENCOUNTER — Encounter: Payer: Self-pay | Admitting: Nurse Practitioner

## 2019-11-30 ENCOUNTER — Inpatient Hospital Stay: Payer: Medicare PPO | Attending: Nurse Practitioner | Admitting: Nurse Practitioner

## 2019-11-30 ENCOUNTER — Other Ambulatory Visit: Payer: Self-pay

## 2019-11-30 VITALS — BP 197/68 | HR 77 | Temp 97.0°F | Resp 17 | Ht 61.0 in | Wt 115.2 lb

## 2019-11-30 DIAGNOSIS — I110 Hypertensive heart disease with heart failure: Secondary | ICD-10-CM | POA: Diagnosis not present

## 2019-11-30 DIAGNOSIS — R634 Abnormal weight loss: Secondary | ICD-10-CM | POA: Diagnosis not present

## 2019-11-30 DIAGNOSIS — D649 Anemia, unspecified: Secondary | ICD-10-CM | POA: Diagnosis not present

## 2019-11-30 DIAGNOSIS — R970 Elevated carcinoembryonic antigen [CEA]: Secondary | ICD-10-CM | POA: Diagnosis not present

## 2019-11-30 DIAGNOSIS — K769 Liver disease, unspecified: Secondary | ICD-10-CM

## 2019-11-30 DIAGNOSIS — I509 Heart failure, unspecified: Secondary | ICD-10-CM | POA: Diagnosis not present

## 2019-11-30 DIAGNOSIS — E538 Deficiency of other specified B group vitamins: Secondary | ICD-10-CM | POA: Diagnosis not present

## 2019-11-30 DIAGNOSIS — E611 Iron deficiency: Secondary | ICD-10-CM

## 2019-11-30 DIAGNOSIS — N289 Disorder of kidney and ureter, unspecified: Secondary | ICD-10-CM

## 2019-11-30 DIAGNOSIS — R160 Hepatomegaly, not elsewhere classified: Secondary | ICD-10-CM

## 2019-11-30 DIAGNOSIS — I2510 Atherosclerotic heart disease of native coronary artery without angina pectoris: Secondary | ICD-10-CM | POA: Diagnosis not present

## 2019-11-30 NOTE — Progress Notes (Addendum)
[place]  [phone] Fax:([phone] [place] Note   Patient Care Team: Mitzi Hansen, MD as PCP - General (Internal Medicine) Marshell Garfinkel, MD as Consulting Physician (Pulmonary Disease) Garrel Ridgel, DPM as Consulting Physician (Podiatry) Marybelle Killings, MD as Consulting Physician (Orthopedic Surgery) Jonnie Finner, RN as Oncology Nurse Navigator [name], NP as Nurse Practitioner (Nurse Practitioner) Truitt Merle, MD as Consulting Physician (Oncology) Date of Service: 11/30/2019   CHIEF COMPLAINTS/PURPOSE OF CONSULTATION:  Liver lesions, suspicious for malignancy, referred by PCP Dr. Darrick Meigs   HISTORY OF PRESENTING ILLNESS:  Kristi Alexander 86 y.o. female with history of HTN, CHF, PAD, interstitial lung disease, B12 and iron deficiency is here because of liver lesions and concern for malignancy.  She was initially seen on 11/07/2019 by PCP Dr. Darrick Meigs for right flank pain, constipation, anorexia, and 15 lbs weight loss in <60 months.  Labs on 9/28 showed a mild anemia with Hgb 11.2, normal lipase.  X-rays on 9/15 showed chronic interstitial lung disease, no acute renal calculi or cardiopulmonary disease.  CT AP without contrast on 9/21 showed large gallstone in the gallbladder fundus, a large stool burden in the rectum concerning for impaction, and a 15 mm left hepatic density.  Follow-up abdominal ultrasound on 9/29 showed no biliary biliary dilatation, left hepatic lesions measuring 3.5 cm and a second measuring 1.1 cm present on the CT.  Abdominal MRI on 9/30 showed 3 adjacent masses in the left hepatic lobe measuring 3 cm, 2.2 cm, and 1.7 cm with nonspecific features, hepatic metastases were not excluded.  Tumor markers on 10/1 showed a normal CA 19-9 and AFP, but an elevated CEA to 17.  She was referred to medical oncology for further evaluation and management. Last colonoscopy 06/2011 by Dr. Maurie Boettcher showed 4 polyps in the  transverse, sigmoid, and rectosigmoid colon, path showed tubular adenoma.  She notes in 19-May-2013 she had a "blockage" which required removal of part of her intestine.  She has had no recurrent episodes.  Socially, she is widowed, lives with her daughter.  She has 2 children, her son died in 05-20-11 from stomach cancer.  She functions well at home, independent of ADLs except she does not drive, she walks and is active.  She denies alcohol use or drug use, she is a former smoker less than 1 pack/day for 40 years but quit 40 years ago.  Last mammogram in the 80s, s/p hysterectomy in the 80s for menorrhagia.  Family history of cancer includes 2 maternal aunts with cancer 1 lung 1 uterine, and her son who died of stomach cancer.  She does not know of any genetic testing.  Today, she is here with her daughter. She denies fatigue. She continues to note 8 out of 10 intermittent right upper quadrant/flank pain that worsens with movement.  Tramadol helps.  Manages chronic constipation with medication as needed, no nausea or vomiting.  She has a cough only with allergies, no dyspnea since CHF exacerbation, no fever, chills, chest pain.  She has generalized pain from arthritis.   MEDICAL HISTORY:  Past Medical History:  Diagnosis Date   Abnormality of gait 11/22/2014   Acute exacerbation of CHF (congestive heart failure) ([place]) 06/23/2018   Arthritis    Cyst of breast, right, benign solitary    Dyslipidemia    H/O: hysterectomy    Heart murmur    History of appendectomy    History of lumbosacral spine surgery  Hypertension    Inguinal hernia    Lumbar stenosis    Restless leg syndrome     SURGICAL HISTORY: Past Surgical History:  Procedure Laterality Date   ABDOMINAL HYSTERECTOMY     APPENDECTOMY     BOWEL RESECTION N/A 03/23/2013   Procedure: SMALL BOWEL RESECTION;  Surgeon: Gwenyth Ober, MD;  Location: [place];  Service: General;  Laterality: N/A;   BREAST CYST EXCISION     LEFT    ESOPHAGOGASTRODUODENOSCOPY N/A 04/01/2013   Procedure: ESOPHAGOGASTRODUODENOSCOPY (EGD);  Surgeon: Gwenyth Ober, MD;  Location: [facility] ENDOSCOPY;  Service: General;  Laterality: N/A;   HERNIA REPAIR     LAPAROTOMY N/A 03/23/2013   Procedure: EXPLORATORY LAPAROTOMY;  Surgeon: Gwenyth Ober, MD;  Location: [place];  Service: General;  Laterality: N/A;   LUMBAR LAMINECTOMY/DECOMPRESSION MICRODISCECTOMY N/A 11/10/2016   Procedure: L4-5 DECOMPRESSION FOR RECURRENT STENOSIS;  Surgeon: Marybelle Killings, MD;  Location: [place];  Service: Orthopedics;  Laterality: N/A;   PEG PLACEMENT N/A 04/01/2013   Procedure: PERCUTANEOUS ENDOSCOPIC GASTROSTOMY (PEG) PLACEMENT;  Surgeon: Gwenyth Ober, MD;  Location: [place];  Service: General;  Laterality: N/A;   PEG TUBE REMOVAL     TRACHEOSTOMY     feinstein   TRACHEOSTOMY CLOSURE      SOCIAL HISTORY: Social History   Socioeconomic History   Marital status: Widowed    Spouse name: [name]    Number of children: 2   Years of education: Not on file   Highest education level: Bachelor's degree (e.g., BA, AB, BS)  Occupational History   Occupation: Retired  Tobacco Use   Smoking status: Former Smoker    Packs/day: 0.25    Years: 40.00    Pack years: 10.00    Types: Cigarettes    Quit date: 02/25/2003    Years since quitting: 16.7   Smokeless tobacco: Never Used  Scientific laboratory technician Use: Never used  Substance and Sexual Activity   Alcohol use: Yes    Comment: About 2 drinks per night   Drug use: No   Sexual activity: Not on file  Other Topics Concern   Not on file  Social History Narrative   Current Social History 06/28/2019        Patient lives with family daughter, Kristi Alexander in a one level home. There are not steps up to the entrance the patient uses.       Patient's method of transportation is via daughter.      The highest level of education was Dietitian in [place].      The patient currently retired.       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 Ducatte, BSN, RN-BC       Social Determinants of Health   Financial Resource Strain:    Difficulty of Paying Living Expenses: Not on file  Food Insecurity:    Worried  About Running Out of Food in the Last Year: Not on file   Ran Out of Food in the Last Year: Not on file  Transportation Needs:    Lack of Transportation (Medical): Not on file   Lack of Transportation (Non-Medical): Not on file  Physical Activity:    Days of Exercise per Week: Not on file   Minutes of Exercise per Session: Not on file  Stress:    Feeling of Stress : Not on file  Social Connections:    Frequency of Communication with Friends and Family: Not on file   Frequency of Social Gatherings with Friends and Family: Not on file   Attends Religious Services: Not on file   Active Member of Clubs or Organizations: Not on file   Attends Archivist Meetings: Not on file   Marital Status: Not on file  Intimate Partner Violence:    Fear of Current or Ex-Partner: Not on file   Emotionally Abused: Not on file   Physically Abused: Not on file   Sexually Abused: Not on file    FAMILY HISTORY: Family History  Problem Relation Age of Onset   Heart disease Mother    Diabetes Mother    Heart attack Father    Cancer Brother        lung CA   Lung cancer Brother    Arthritis Sister    Osteoporosis Sister    Stomach cancer Son    Cancer Son        stomach CA   Migraines Daughter     Arthritis Daughter    Cancer Maternal Uncle        lung   Cancer Maternal Aunt        uterine    ALLERGIES:  is allergic to penicillins and pravastatin.  MEDICATIONS:  Current Outpatient Medications  Medication Sig Dispense Refill   acetaminophen (TYLENOL) 500 MG tablet Take 500 mg every 6 (six) hours as needed by mouth (pain).     atorvastatin (LIPITOR) 20 MG tablet Take 20 mg by mouth daily.     benzonatate (TESSALON) 100 MG capsule Take 1 capsule (100 mg total) by mouth 2 (two) times daily as needed for cough. 60 capsule 2   Calcium Carb-Cholecalciferol (CALCIUM-VITAMIN D) 600-400 MG-UNIT TABS Take 1 tablet by mouth once daily 90 tablet 1   carvedilol (COREG) 6.25 MG tablet TAKE 1 TABLET BY MOUTH TWICE DAILY WITH A MEAL 60 tablet 2   diclofenac Sodium (VOLTAREN) 1 % GEL SMARTSIG:2 Gram(s) Topical 4 Times Daily PRN     fluticasone (FLONASE) 50 MCG/ACT nasal spray Place 1 spray into both nostrils daily as needed for allergies or rhinitis. 16 g 2   furosemide (LASIX) 40 MG tablet Take 40 mg by mouth daily.     hydrOXYzine (ATARAX/VISTARIL) 25 MG tablet TAKE 1 TABLET BY MOUTH ONCE DAILY AS NEEDED FOR  ITCHING 90 tablet 0   isosorbide mononitrate (IMDUR) 30 MG 24 hr tablet Take 1 tablet by mouth once daily 90 tablet 1   loratadine (CLARITIN) 10 MG tablet Take 1 tablet (10 mg total) by mouth daily as needed for allergies. 30 tablet 0   Multiple Vitamin (MULTIVITAMIN WITH MINERALS) TABS tablet Take 1 tablet by mouth daily.     sacubitril-valsartan (ENTRESTO) 49-51 MG Take 1 tablet by mouth 2 (two) times daily. 60 tablet 3   spironolactone (ALDACTONE) 50 MG tablet Take 1/2 (one-half) tablet by mouth once daily 90 tablet 0   No current  facility-administered medications for this visit.    REVIEW OF SYSTEMS:   Constitutional: Denies fevers, chills or abnormal night sweats (+) 15 pounds unintentional weight loss in less than 6 months Eyes: Denies blurriness of vision, double  vision or watery eyes Ears, nose, mouth, throat, and face: Denies mucositis or sore throat Respiratory: Denies cough, dyspnea or wheezes (+) cough with allergies Cardiovascular: Denies palpitation, chest discomfort or lower extremity swelling Gastrointestinal:  Denies nausea, vomiting, diarrhea, heartburn or change in bowel habits (+) chronic constipation (+) RUQ/flank pain Skin: Denies abnormal skin rashes Lymphatics: Denies new lymphadenopathy or easy bruising Neurological:Denies numbness, tingling or new weaknesses Behavioral/Psych: Mood is stable, no new changes  All other systems were reviewed with the patient and are negative.  PHYSICAL EXAMINATION: ECOG PERFORMANCE STATUS: 0 - Asymptomatic  Vitals:   11/30/19 1347  BP: (!) 197/68  Pulse: 77  Resp: 17  Temp: (!) 97 F (36.1 C)  SpO2: 100%   Filed Weights   11/30/19 1347  Weight: 115 lb 3.2 oz (52.3 kg)    GENERAL:alert, no distress and comfortable SKIN: No rash to exposed skin EYES: sclera clear NECK: without mass LYMPH:  no palpable cervical or supraclavicular lymphadenopathy  LUNGS: clear with normal breathing effort HEART: regular rate & rhythm, no lower extremity edema ABDOMEN:abdomen soft, non-tender and normal bowel sounds.  No hepatomegaly or palpable mass Musculoskeletal:no cyanosis of digits and no clubbing  PSYCH: alert & oriented x 3 with fluent speech NEURO: no focal motor/sensory deficits  LABORATORY DATA:  I have reviewed the data as listed CBC Latest Ref Rng & Units 11/22/2019 08/25/2019 06/23/2018  WBC 4.0 - 10.5 K/uL 5.0 7.5 5.8  Hemoglobin 12.0 - 15.0 g/dL 11.2(L) 11.3 12.3  Hematocrit 36 - 46 % 35.2(L) 34.4 38.8  Platelets 150 - 400 K/uL 278 319 220   CMP Latest Ref Rng & Units 11/22/2019 08/25/2019 08/20/2018  Glucose 70 - 99 mg/dL 107(H) 107(H) -  BUN 8 - 23 mg/dL 16 29 -  Creatinine 0.44 - 1.00 mg/dL 1.12(H) 1.32(H) -  Sodium 135 - 145 mmol/L 138 137 -  Potassium 3.5 - 5.1 mmol/L 4.0 4.8 -    Chloride 98 - 111 mmol/L 103 99 -  CO2 22 - 32 mmol/L 26 24 -  Calcium 8.9 - 10.3 mg/dL 9.9 10.1 -  Total Protein 6.5 - 8.1 g/dL 6.5 - -  Total Bilirubin 0.3 - 1.2 mg/dL 1.0 - -  Alkaline Phos 38 - 126 U/L 92 - 83  AST 15 - 41 U/L 25 - -  ALT 0 - 44 U/L 13 - -     RADIOGRAPHIC STUDIES: I have personally reviewed the radiological images as listed and agreed with the findings in the report. CT Abdomen Pelvis Wo Contrast  Result Date: 11/15/2019 CLINICAL DATA:  Acute right flank pain. EXAM: CT ABDOMEN AND PELVIS WITHOUT CONTRAST TECHNIQUE: Multidetector CT imaging of the abdomen and pelvis was performed following the standard protocol without IV contrast. COMPARISON:  January 10, 2017. FINDINGS: Lower chest: No acute abnormality. Hepatobiliary: Large gallstone is noted in the fundus of the gallbladder. No biliary dilatation is noted. Stable small right hepatic cyst is noted. New 15 mm left hepatic low density is noted of uncertain etiology. Pancreas: Unremarkable. No pancreatic ductal dilatation or surrounding inflammatory changes. Spleen: Normal in size without focal abnormality. Adrenals/Urinary Tract: Stable left adrenal adenoma. Right adrenal gland appears normal. No hydronephrosis or renal obstruction is noted. No renal or ureteral calculi are noted. Urinary  bladder is unremarkable. Stomach/Bowel: The stomach appears normal. There is no evidence of bowel obstruction or inflammation. Diverticulosis is noted throughout the colon. Large amount of stool is noted in the rectum concerning for impaction. Vascular/Lymphatic: Aortic atherosclerosis. No enlarged abdominal or pelvic lymph nodes. Reproductive: Status post hysterectomy. No adnexal masses. Other: No abdominal wall hernia or abnormality. No abdominopelvic ascites. Musculoskeletal: No fracture is seen. IMPRESSION: 1. 15 mm left hepatic low density is noted of uncertain etiology. MRI with and without gadolinium is recommended to evaluate for  possible neoplasm. 2. Large gallstone is noted in the fundus of the gallbladder. 3. Stable left adrenal adenoma. 4. Diverticulosis is noted throughout the colon. 5. Large amount of stool is noted in the rectum concerning for impaction. 6. Aortic atherosclerosis. Aortic Atherosclerosis (ICD10-I70.0). Electronically Signed   By: Marijo Conception M.D.   On: 11/15/2019 15:33   DG Chest 2 View  Result Date: 11/09/2019 CLINICAL DATA:  Right flank pain X 1 week, right thoracic pain X 1 week, sore and achy. EXAM: CHEST - 2 VIEW COMPARISON:  Chest x-ray 06/23/2018. FINDINGS: Mild cardiomegaly.  Stable cardiomediastinal silhouette. Flattened hemidiaphragms and coarsened interstitial markings consistent with known emphysema as well as pulmonary fibrosis. Biapical pleural/pulmonary scarring. No focal consolidation. No over pulmonary edema. No pleural effusion. No pneumothorax. No acute osseous abnormality. Multilevel degenerative changes spine. Severe degenerative changes of the left shoulder. IMPRESSION: No active cardiopulmonary disease in a patient with chronic mild cardiomegaly, emphysema, and pulmonary fibrosis. Electronically Signed   By: Iven Finn M.D.   On: 11/09/2019 17:45   MR Abdomen W Wo Contrast  Result Date: 11/24/2019 CLINICAL DATA:  Right-sided abdominal pain for 2 weeks. Indeterminate liver lesion on recent CT. EXAM: MRI ABDOMEN WITHOUT AND WITH CONTRAST TECHNIQUE: Multiplanar multisequence MR imaging of the abdomen was performed both before and after the administration of intravenous contrast. CONTRAST:  81m MULTIHANCE GADOBENATE DIMEGLUMINE 529 MG/ML IV SOLN COMPARISON:  Noncontrast CT on 11/15/2019, and contrast enhanced CT on 01/10/2017 FINDINGS: Lower chest: No acute findings. Hepatobiliary: 2 tiny cysts are seen in the inferior right hepatic lobe. Three adjacent masses are seen in the left hepatic lobe, which measure 3.0 cm, 2.2 cm, and 1.7 cm in maximum diameter. These masses all have  similar characteristics including mild T2 hyperintensity and mild central heterogeneous contrast enhancement seen on subtraction imaging. These have nonspecific characteristics, but are new since 2018. Hepatic metastases cannot be excluded. 2 cm gallstone is noted, however there is no evidence of cholecystitis or biliary ductal dilatation. Pancreas:  No mass or inflammatory changes. Spleen:  Within normal limits in size and appearance. Adrenals/Urinary Tract: Stable 1.6 cm left adrenal mass, consistent with benign adenoma. No renal masses identified. No evidence of hydronephrosis. Stomach/Bowel: Visualized portion unremarkable. Vascular/Lymphatic: No pathologically enlarged lymph nodes identified. No abdominal aortic aneurysm. Other:  None. Musculoskeletal:  No suspicious bone lesions identified. IMPRESSION: Three hypovascular masses in the left hepatic lobe, which have nonspecific characteristics, but are new since 2018. Recommend correlation with tumor markers, and consider tissue sampling and/or PET-CT. Stable benign left adrenal adenoma. Cholelithiasis. No radiographic evidence of cholecystitis or biliary ductal dilatation. Electronically Signed   By: JMarlaine HindM.D.   On: 11/24/2019 16:17   DG Abd 2 Views  Result Date: 11/09/2019 CLINICAL DATA:  Flank pain for 1 week.  Concern for kidney stones. EXAM: X-RAY ABDOMEN 2 VIEWS COMPARISON:  CT AP 01/10/2017 FINDINGS: The bowel gas pattern is normal. There is no evidence of free  air. No radio-opaque calculi or other significant radiographic abnormality is seen. Scoliosis and degenerative disc disease noted within the lumbar spine. Signs of chronic interstitial lung disease scratch set signs of chronic fibrotic interstitial lung disease noted within the imaged portions of the lower lung zones. IMPRESSION: 1. No renal calculi identified. 2. Chronic interstitial lung disease. Electronically Signed   By: Kerby Moors M.D.   On: 11/09/2019 18:18   US Abdomen  Limited RUQ  Result Date: 11/23/2019 CLINICAL DATA:  Right upper quadrant pain, concern for cholecystitis EXAM: ULTRASOUND ABDOMEN LIMITED RIGHT UPPER QUADRANT COMPARISON:  11/15/2019 CT without contrast FINDINGS: Gallbladder: Large echogenic shadowing gallstone measures up to 2.3 cm. Normal wall thickness measuring 1.1 mm. No Murphy's sign or pericholecystic fluid. No signs of cholecystitis. Common bile duct: Diameter: 5.7 mm Liver: Central left hepatic mixed echogenicity lesion noted measuring 3.5 x 2.4 x 3.9 cm. This remains indeterminate by ultrasound but does not have the typical appearance of hemangioma. Additional adjacent left hepatic hypoechoic lesion measures 1.1 x 0.9 x 1.1 cm. No definite right hepatic abnormality by ultrasound. No biliary dilatation. Portal vein is patent on color Doppler imaging with normal direction of blood flow towards the liver. Other: No free fluid or ascites IMPRESSION: Cholelithiasis with a large gallstone measures 2.3 cm. No signs of cholecystitis by ultrasound No biliary dilatation Indeterminate adjacent left hepatic lesions, 1 measuring 3.5 cm and a second measuring 1.1 cm. These appear new since 2018 but present on the noncontrast CT comparison of 11/15/2019. Recommend further evaluation with MRI without and with contrast. These results will be called to the ordering clinician or representative by the Radiologist Assistant, and communication documented in the PACS or [employer]. Electronically Signed   By: [name].  Shick M.D.   On: 11/23/2019 12:25    ASSESSMENT & PLAN: 86 year old female  1.  Left hepatic lobe lesions, suspicious for malignancy -We reviewed her medical record including imaging and labs in detail.  She presented with abdominal pain and weight loss, imaging it showed 3 liver lesions up to 3 cm in the left hepatic lobe, no obvious primary site on CT or MRI. -Work-up shows elevated CEA supporting a GI primary, normal CA 19-9.  She has no history of  liver disease or significant alcohol use, AFP is normal so this is likely not primary [place]. -She is elderly but active and independent, comorbidities are controlled, she has a very good performance status.  We feel she would likely tolerate biopsy and work-up -She is being referred for liver biopsy to rule out malignancy and determine the primary site.  If this does show cancer we will ask for MMR and molecular studies.  I spoke to Dr. [name] in IR who approved the case. -We discussed if liver biopsy is not sufficient to determine the primary site she may require additional work-up such as endoscopy or colonoscopy, she is agreeable -I also referring her for CT chest to complete staging -Recent labs reviewed, she has mild stable anemia Hg 11.2, mild renal dysfunction Scr 1.12, normal liver function -We will see her back after biopsy to review results and discuss the treatment plan  2.  Co-morbidities: HTN, pulmonary HTN, CHF, CAD, B12 and iron deficiency -Continue follow-up with PCP Dr. Darrick Meigs, cardio Dr. Terrence Dupont, pulmonary Dr. Vaughan Browner, and Ortho Dr. Lorin Mercy -Co-morbidities appear to be well controlled at this time  Plan: -Work-up reviewed, proceed with IR liver biopsy and staging CT chest -Follow-up after work-up to review results   Orders  Placed This Encounter  Procedures   US BIOPSY (LIVER)    Standing Status:   Future    Standing Expiration Date:   11/29/2020    Order Specific Question:   Lab orders requested (DO NOT place separate lab orders, these will be automatically ordered during procedure specimen collection):    Answer:   Surgical Pathology    Order Specific Question:   Lab orders requested (DO NOT place separate lab orders, these will be automatically ordered during procedure specimen collection):    Answer:   None    Order Specific Question:   Reason for Exam (SYMPTOM  OR DIAGNOSIS REQUIRED)    Answer:   liver lesions, r/o malignancy. obtain enough tissue for MMR, foundation  one    Order Specific Question:   Preferred location?    Answer:   [facility]   CT Chest Wo Contrast    Standing Status:   Future    Standing Expiration Date:   11/29/2020    Order Specific Question:   Preferred imaging location?    Answer:   [facility]    All questions were answered. The patient knows to call the clinic with any problems, questions or concerns.     [name], NP 12/01/2019   Addendum   I have seen the patient, examined her. I agree with the assessment and and plan and have edited the notes.   Mr Gonzalo is a 86 yo female with past medical history of hypertension, CAD, CHF, presented with abdominal pain, weight loss and fatigue.  I have reviewed her recent CT and MRI of abdomen images personally, and discussed with patient.  Her clinical presentation and image findings are highly suspicious for metastatic malignancy.  She has no history of liver disease, AFP was normal, unlikely hepatocellular carcinoma.  Tumor marker CEA was elevated.  I recommend CT chest for staging and liver biopsy for definitive diagnosis. Depends on the tissue diagnosis, we will decide if she needs further work up such as endoscopy.  Patient voiced good understanding, and agrees to proceed.  Although she is elderly and has medical comorbidities, she lives independently, probably still a candidate for treatment.  Risks of liver biopsy discussed with patient, she agrees to proceed.  Went to see her back after scan and biopsy. All questions were answered.   Truitt Merle  11/30/2019

## 2019-11-30 NOTE — Progress Notes (Signed)
Met with patient and her daughter Kristi Alexander today at her initial medical oncology consult with [name] NP and Dr. Burr Medico.  I explained my role as nurse navigator. They were both given my card with my direct contact information and encouraged to call with any questions or concerns.

## 2019-12-01 ENCOUNTER — Encounter: Payer: Self-pay | Admitting: Nurse Practitioner

## 2019-12-05 ENCOUNTER — Encounter (HOSPITAL_COMMUNITY): Payer: Self-pay | Admitting: Radiology

## 2019-12-05 NOTE — Progress Notes (Signed)
Kristi Alexander Female, 86 y.o., 1925-03-07 MRN:  [id number] Phone:  [phone] [name]) PCP:  Mitzi Hansen, MD Coverage:  [facility] Medicare/[employer] Medicare Choice Ppo Next Appt With Radiology ([facility] 1) 12/13/2019 at 10:30 AM  FW: Biopsy Received: Today [name], [name]     Previous Messages   ----- Message -----  From: [name], DO  Sent: 11/30/2019  3:56 PM EDT  To: [name]  Subject: RE: Biopsy                    OK for US guided liver mass biopsy.   Discussed with referring. Patient is independent and desires treatment for suspected colon mets.   [name]  ----- Message -----  From: [name]  Sent: 11/30/2019  3:43 PM EDT  To: Ir Procedure Requests  Subject: Biopsy                      Procedure Requested: US Biopsy (Liver)    Reason for Procedure: liver lesions     Provider Requesting: [name]  Provider Telephone: [phone]    Other Info: r/o malignancy. obtain enough tissue for MMR, foundation one

## 2019-12-07 ENCOUNTER — Other Ambulatory Visit: Payer: Self-pay | Admitting: Physician Assistant

## 2019-12-08 ENCOUNTER — Telehealth: Payer: Self-pay

## 2019-12-08 NOTE — Telephone Encounter (Signed)
Per Dr. Burr Medico appt for 12/15/2019 at 1440 was made.  I reviewed this with her daughter, [name].  She verbalized understanding.

## 2019-12-10 ENCOUNTER — Emergency Department (HOSPITAL_COMMUNITY)
Admission: EM | Admit: 2019-12-10 | Discharge: 2019-12-10 | Disposition: A | Discharge status: Home / Self Care | Payer: Medicare PPO | Attending: Emergency Medicine | Admitting: Emergency Medicine

## 2019-12-10 ENCOUNTER — Other Ambulatory Visit: Payer: Self-pay

## 2019-12-10 ENCOUNTER — Encounter (HOSPITAL_COMMUNITY): Payer: Self-pay | Admitting: Emergency Medicine

## 2019-12-10 DIAGNOSIS — I5042 Chronic combined systolic (congestive) and diastolic (congestive) heart failure: Secondary | ICD-10-CM | POA: Diagnosis not present

## 2019-12-10 DIAGNOSIS — K8050 Calculus of bile duct without cholangitis or cholecystitis without obstruction: Secondary | ICD-10-CM

## 2019-12-10 DIAGNOSIS — Z87891 Personal history of nicotine dependence: Secondary | ICD-10-CM | POA: Diagnosis not present

## 2019-12-10 DIAGNOSIS — R109 Unspecified abdominal pain: Secondary | ICD-10-CM | POA: Diagnosis present

## 2019-12-10 DIAGNOSIS — I110 Hypertensive heart disease with heart failure: Secondary | ICD-10-CM | POA: Insufficient documentation

## 2019-12-10 DIAGNOSIS — Z79899 Other long term (current) drug therapy: Secondary | ICD-10-CM | POA: Insufficient documentation

## 2019-12-10 DIAGNOSIS — I10 Essential (primary) hypertension: Secondary | ICD-10-CM | POA: Diagnosis not present

## 2019-12-10 LAB — COMPREHENSIVE METABOLIC PANEL
ALT: 13 U/L (ref 0–44)
AST: 28 U/L (ref 15–41)
Albumin: 3.1 g/dL — ABNORMAL LOW (ref 3.5–5.0)
Alkaline Phosphatase: 108 U/L (ref 38–126)
Anion gap: 9 (ref 5–15)
BUN: 23 mg/dL (ref 8–23)
CO2: 28 mmol/L (ref 22–32)
Calcium: 10.5 mg/dL — ABNORMAL HIGH (ref 8.9–10.3)
Chloride: 103 mmol/L (ref 98–111)
Creatinine, Ser: 1.31 mg/dL — ABNORMAL HIGH (ref 0.44–1.00)
GFR, Estimated: 35 mL/min — ABNORMAL LOW (ref >60)
Glucose, Bld: 114 mg/dL — ABNORMAL HIGH (ref 70–99)
Potassium: 4.2 mmol/L (ref 3.5–5.1)
Sodium: 140 mmol/L (ref 135–145)
Total Bilirubin: 1.2 mg/dL (ref 0.3–1.2)
Total Protein: 6.8 g/dL (ref 6.5–8.1)

## 2019-12-10 LAB — CBC
HCT: 34.9 % — ABNORMAL LOW (ref 36.0–46.0)
Hemoglobin: 10.9 g/dL — ABNORMAL LOW (ref 12.0–15.0)
MCH: 29.3 pg (ref 26.0–34.0)
MCHC: 31.2 g/dL (ref 30.0–36.0)
MCV: 93.8 fL (ref 80.0–100.0)
Platelets: 308 10*3/uL (ref 150–400)
RBC: 3.72 MIL/uL — ABNORMAL LOW (ref 3.87–5.11)
RDW: 15.1 % (ref 11.5–15.5)
WBC: 6.6 10*3/uL (ref 4.0–10.5)
nRBC: 0 % (ref 0.0–0.2)

## 2019-12-10 LAB — LIPASE, BLOOD: Lipase: 30 U/L (ref 11–51)

## 2019-12-10 MED ORDER — CARVEDILOL 3.125 MG PO TABS: 6.2500 mg | ORAL_TABLET | Freq: Once | ORAL | Status: DC

## 2019-12-10 MED ORDER — SODIUM CHLORIDE 0.9 % IV BOLUS: 250.0000 mL | Freq: Once | INTRAVENOUS | Status: DC

## 2019-12-10 MED ORDER — SODIUM CHLORIDE 0.9 % IV BOLUS: 1000.0000 mL | Freq: Once | INTRAVENOUS | Status: DC

## 2019-12-10 NOTE — Discharge Instructions (Addendum)
Continue your home medications including her pain medications as previously prescribed. Follow-up with your primary care provider. Make sure you are drinking plenty of fluids to prevent dehydration. Return to the ED if you start to experience worsening abdominal pain, chest pain, vomiting, shortness of breath.

## 2019-12-10 NOTE — ED Notes (Signed)
Patient did not take bp meds

## 2019-12-10 NOTE — ED Provider Notes (Signed)
[facility] EMERGENCY DEPARTMENT Provider Note   CSN: [id number] Arrival date & time: 12/10/19  1206     History Chief Complaint  Patient presents with  . Abdominal Pain    Kristi Alexander is a 86 y.o. female  with a past medical history of gallstones, CHF with an EF of 20 to 25% seen on echo, hypertension presenting to the ED with a chief complaint of body aches and abdominal pain.  For the past 3 weeks has had pain in bilateral shoulders, feet.  She was told about 3 weeks ago that she had a gallstone on imaging.  She has had intermittent pain associated with this.  Pain is unchanged, no pain currently.  She takes tramadol and Tylenol with some improvement in her pain.  No injuries or falls.  She is concerned that the joint pain could be due to her arthritis.  Denies any fever, shortness of breath, chest pain, vomiting, changes to bowel movements or urination, leg swelling.  States that she has had a decreased appetite for the past several months.  She remains ambulatory at baseline with a cane.   HPI     Past Medical History:  Diagnosis Date  . Abnormality of gait 11/22/2014  . Acute exacerbation of CHF (congestive heart failure) ([place]) 06/23/2018  . Arthritis   . Cyst of breast, right, benign solitary   . Dyslipidemia   . H/O: hysterectomy   . Heart murmur   . History of appendectomy   . History of lumbosacral spine surgery   . Hypertension   . Inguinal hernia   . Lumbar stenosis   . Restless leg syndrome     Patient Active Problem List   Diagnosis Date Noted  . Lesion of left lobe of liver 11/16/2019  . Constipation 11/07/2019  . Generalized symptoms 11/07/2019  . Iron deficiency anemia 09/28/2019  . Chronic left-sided back pain 06/01/2019  . Abdominal pain 03/18/2019  . Acute kidney injury ([place]) 07/02/2018  . Environmental allergies 06/10/2018  . Interstitial lung disease ([place]) 04/28/2018  . Impingement syndrome of left shoulder 11/10/2017  .  Atherosclerosis of native artery of both lower extremities with intermittent claudication ([place]) 08/08/2016  . Peripheral arterial disease ([place]) 08/04/2016  . Vitamin D deficiency 11/28/2015  . Osteopenia 09/16/2015  . Pain in joint, shoulder region 09/16/2015  . Spinal stenosis of lumbar region 01/24/2015  . Essential hypertension 12/11/2014  . Pulmonary hypertension ([place])   . Chronic cough   . Combined congestive systolic and diastolic heart failure ([place]) 05/15/2013  . Pre-diabetes 05/14/2013  . Other vitamin B12 deficiency anemia 07/13/2012  . Restless legs syndrome (RLS) 07/13/2012    Past Surgical History:  Procedure Laterality Date  . ABDOMINAL HYSTERECTOMY    . APPENDECTOMY    . BOWEL RESECTION N/A 03/23/2013   Procedure: SMALL BOWEL RESECTION;  Surgeon: Gwenyth Ober, MD;  Location: [facility];  Service: General;  Laterality: N/A;  . BREAST CYST EXCISION     LEFT  . ESOPHAGOGASTRODUODENOSCOPY N/A 04/01/2013   Procedure: ESOPHAGOGASTRODUODENOSCOPY (EGD);  Surgeon: Gwenyth Ober, MD;  Location: [facility];  Service: General;  Laterality: N/A;  . HERNIA REPAIR    . LAPAROTOMY N/A 03/23/2013   Procedure: EXPLORATORY LAPAROTOMY;  Surgeon: Gwenyth Ober, MD;  Location: [facility];  Service: General;  Laterality: N/A;  . LUMBAR LAMINECTOMY/DECOMPRESSION MICRODISCECTOMY N/A 11/10/2016   Procedure: L4-5 DECOMPRESSION FOR RECURRENT STENOSIS;  Surgeon: Marybelle Killings, MD;  Location: [facility];  Service:  Orthopedics;  Laterality: N/A;  . PEG PLACEMENT N/A 04/01/2013   Procedure: PERCUTANEOUS ENDOSCOPIC GASTROSTOMY (PEG) PLACEMENT;  Surgeon: Gwenyth Ober, MD;  Location: [facility];  Service: General;  Laterality: N/A;  . PEG TUBE REMOVAL    . TRACHEOSTOMY     feinstein  . TRACHEOSTOMY CLOSURE       OB History   No obstetric history on file.     Family History  Problem Relation Age of Onset  . Heart disease Mother   . Diabetes Mother   . Heart attack Father   . Cancer Brother        lung CA    . Lung cancer Brother   . Arthritis Sister   . Osteoporosis Sister   . Stomach cancer Son   . Cancer Son        stomach CA  . Migraines Daughter   . Arthritis Daughter   . Cancer Maternal Uncle        lung  . Cancer Maternal Aunt        uterine    Social History   Tobacco Use  . Smoking status: Former Smoker    Packs/day: 0.25    Years: 40.00    Pack years: 10.00    Types: Cigarettes    Quit date: 02/25/2003    Years since quitting: 16.8  . Smokeless tobacco: Never Used  Vaping Use  . Vaping Use: Never used  Substance Use Topics  . Alcohol use: Yes    Comment: About 2 drinks per night  . Drug use: No    Home Medications Prior to Admission medications   Medication Sig Start Date End Date Taking? Authorizing Provider  acetaminophen (TYLENOL) 500 MG tablet Take 500 mg every 6 (six) hours as needed by mouth (pain).    [provider]  atorvastatin (LIPITOR) 20 MG tablet Take 20 mg by mouth daily. 09/17/19   [provider]  benzonatate (TESSALON) 100 MG capsule Take 1 capsule (100 mg total) by mouth 2 (two) times daily as needed for cough. 04/04/19   Martyn Ehrich, NP  Calcium Carb-Cholecalciferol (CALCIUM-VITAMIN D) 600-400 MG-UNIT TABS Take 1 tablet by mouth once daily 09/09/19   Iona Beard, MD  carvedilol (COREG) 6.25 MG tablet TAKE 1 TABLET BY MOUTH TWICE DAILY WITH A MEAL 10/13/19   Andrew Au, MD  diclofenac Sodium (VOLTAREN) 1 % GEL SMARTSIG:2 Gram(s) Topical 4 Times Daily PRN 10/12/19   [provider]  fluticasone (FLONASE) 50 MCG/ACT nasal spray Place 1 spray into both nostrils daily as needed for allergies or rhinitis. 04/04/19   Martyn Ehrich, NP  furosemide (LASIX) 40 MG tablet Take 40 mg by mouth daily. 10/21/19   [provider]  hydrOXYzine (ATARAX/VISTARIL) 25 MG tablet TAKE 1 TABLET BY MOUTH ONCE DAILY AS NEEDED FOR  ITCHING 11/19/19   Mannam, Hart Robinsons, MD  isosorbide mononitrate (IMDUR) 30 MG 24 hr tablet Take 1  tablet by mouth once daily 09/09/19   Iona Beard, MD  loratadine (CLARITIN) 10 MG tablet Take 1 tablet (10 mg total) by mouth daily as needed for allergies. 08/25/19 08/24/20  Harvie Heck, MD  Multiple Vitamin (MULTIVITAMIN WITH MINERALS) TABS tablet Take 1 tablet by mouth daily.    [provider]  sacubitril-valsartan (ENTRESTO) 49-51 MG Take 1 tablet by mouth 2 (two) times daily. 10/18/19   Madalyn Rob, MD  spironolactone (ALDACTONE) 50 MG tablet Take 1/2 (one-half) tablet by mouth once daily 08/22/19   Christian,  Rylee, MD    Allergies    Penicillins and Pravastatin  Review of Systems   Review of Systems  Constitutional: Positive for appetite change. Negative for chills and fever.  HENT: Negative for ear pain, rhinorrhea, sneezing and sore throat.   Eyes: Negative for photophobia and visual disturbance.  Respiratory: Negative for cough, chest tightness, shortness of breath and wheezing.   Cardiovascular: Negative for chest pain and palpitations.  Gastrointestinal: Positive for abdominal pain. Negative for blood in stool, constipation, diarrhea, nausea and vomiting.  Genitourinary: Negative for dysuria, hematuria and urgency.  Musculoskeletal: Positive for arthralgias and myalgias.  Skin: Negative for rash.  Neurological: Negative for dizziness, weakness and light-headedness.    Physical Exam Updated Vital Signs BP (!) 185/86   Pulse 70   Temp 98.3 F (36.8 C) (Oral)   Resp (!) 23   Ht 5\' 1"  (1.549 m)   Wt 50.8 kg   SpO2 97%   BMI 21.16 kg/m   Physical Exam Vitals and nursing note reviewed.  Constitutional:      General: She is not in acute distress.    Appearance: She is well-developed.  HENT:     Head: Normocephalic and atraumatic.     Nose: Nose normal.  Eyes:     General: No scleral icterus.       Left eye: No discharge.     Conjunctiva/sclera: Conjunctivae normal.  Cardiovascular:     Rate and Rhythm: Normal rate and regular rhythm.     Heart sounds:  Normal heart sounds. No murmur heard.  No friction rub. No gallop.   Pulmonary:     Effort: Pulmonary effort is normal. No respiratory distress.     Breath sounds: Normal breath sounds.  Abdominal:     General: Bowel sounds are normal. There is no distension.     Palpations: Abdomen is soft.     Tenderness: There is no abdominal tenderness. There is no guarding.     Comments: Soft, nontender nondistended.  Musculoskeletal:        General: Normal range of motion.     Cervical back: Normal range of motion and neck supple.     Comments: No significant tenderness of bilateral shoulders or ankles.  No overlying skin changes.  No erythema, edema or warmth of joint.   Skin:    General: Skin is warm and dry.     Findings: No rash.  Neurological:     Mental Status: She is alert.     Motor: No abnormal muscle tone.     Coordination: Coordination normal.     ED Results / Procedures / Treatments   Labs (all labs ordered are listed, but only abnormal results are displayed) Labs Reviewed  COMPREHENSIVE METABOLIC PANEL - Abnormal; Notable for the following components:      Result Value   Glucose, Bld 114 (*)    Creatinine, Ser 1.31 (*)    Calcium 10.5 (*)    Albumin 3.1 (*)    GFR, Estimated 35 (*)    All other components within normal limits  CBC - Abnormal; Notable for the following components:   RBC 3.72 (*)    Hemoglobin 10.9 (*)    HCT 34.9 (*)    All other components within normal limits  LIPASE, BLOOD    EKG EKG Interpretation  Date/Time:  Saturday December 10 2019 12:19:06 EDT Ventricular Rate:  69 PR Interval:  160 QRS Duration: 80 QT Interval:  380 QTC Calculation: 407 R Axis:   -  15 Text Interpretation: Sinus rhythm with Premature atrial complexes T wave abnormalities no longer present compared to April 2020 Confirmed by Sherwood Gambler [phone]) on 12/10/2019 10:07:34 PM   Radiology No results found.  Procedures Procedures (including critical care  time)  Medications Ordered in ED Medications  sodium chloride 0.9 % bolus 250 mL (has no administration in time range)  carvedilol (COREG) tablet 6.25 mg (has no administration in time range)    ED Course  I have reviewed the triage vital signs and the nursing notes.  Pertinent labs & imaging results that were available during my care of the patient were reviewed by me and considered in my medical decision making (see chart for details).    MDM Rules/Calculators/A&P                          86 year old female with a past medical history of gallstones, CHF with an EF of 20 to 25% seen on echo, hypertension presenting to the ED with a chief complaint of body aches and abdominal pain.  3-week history of pain bilateral shoulders, feet that she relates to her arthritis.  She also found out about 3 weeks ago that she had a gallstone on several images of her belly.  She has had intermittent pain associated with this.  Pain has been unchanged for the past 3 weeks but she does not report decreased appetite that has also been present for the past several weeks.  Denies any chest pain, shortness of breath, vomiting, changes to bowel movements or urination or leg swelling.  No injuries or falls.  She remains ambulatory with a cane at baseline.  On exam she is overall well-appearing.  No tenderness to palpation of bilateral shoulders, feet or ankles.  No signs of respiratory distress.  Lungs are clear to auscultation bilaterally.  Abdomen is soft, nontender and nondistended.  She was given Tylenol and tramadol to help with her pain.  She is hypertensive here.  Other vital signs are within normal limits.  CMP with creatinine somewhat similar to baseline.  CBC unremarkable.  Lipase is unremarkable.  I reviewed patient's ultrasound, MRI and CT scan that she had at the end of last month. Doubt infectious cause of her joint pain. She states that she feels dehydrated.  We will give 250 cc bolus and a dose of her home  blood pressure medication as she is hypertensive here and did not take any of her home medications today.  EKG shows sinus rhythm, improvement in T wave changes since last year.  No STEMI.  11:06 PM On recheck patient declining IV fluids or medications.  Told her that she should go home and take her home medications as prescribed.  She is declining further work-up.  Suspect that her symptoms are chronic due to her gallstone and arthritis.  Return precautions given.    Patient is hemodynamically stable, in NAD, and able to ambulate in the ED. Evaluation does not show pathology that would require ongoing emergent intervention or inpatient treatment. I explained the diagnosis to the patient. Pain has been managed and has no complaints prior to discharge. Patient is comfortable with above plan and is stable for discharge at this time. All questions were answered prior to disposition. Strict return precautions for returning to the ED were discussed. Encouraged follow up with PCP.   An After Visit Summary was printed and given to the patient.   Portions of this note were generated with  [employer]. Dictation errors may occur despite best attempts at proofreading.  Final Clinical Impression(s) / ED Diagnoses Final diagnoses:  Biliary colic    Rx / DC Orders ED Discharge Orders    None       Delia Heady, PA-C 12/10/19 2307    Sherwood Gambler, MD 12/12/19 [phone]

## 2019-12-10 NOTE — ED Notes (Signed)
Patient stated she just does not feel right. She stated she just feels like she has low energy for the past couple of days.

## 2019-12-10 NOTE — ED Triage Notes (Signed)
Pt with multiple complaints.  Reports generalized pain all over to bilateral legs, hips, shoulders, back, and RUQ pain x 3 weeks.  Also reports constipation.  Last BM last night.  Denies nausea and vomiting.

## 2019-12-12 ENCOUNTER — Other Ambulatory Visit: Payer: Medicare PPO

## 2019-12-12 ENCOUNTER — Other Ambulatory Visit: Payer: Self-pay | Admitting: Radiology

## 2019-12-13 ENCOUNTER — Ambulatory Visit (HOSPITAL_COMMUNITY)
Admission: RE | Admit: 2019-12-13 | Discharge: 2019-12-13 | Disposition: A | Discharge status: Home / Self Care | Payer: Medicare PPO | Source: Ambulatory Visit | Attending: Nurse Practitioner | Admitting: Nurse Practitioner

## 2019-12-13 ENCOUNTER — Other Ambulatory Visit: Payer: Self-pay

## 2019-12-13 ENCOUNTER — Encounter (HOSPITAL_COMMUNITY): Payer: Self-pay

## 2019-12-13 DIAGNOSIS — E538 Deficiency of other specified B group vitamins: Secondary | ICD-10-CM | POA: Diagnosis not present

## 2019-12-13 DIAGNOSIS — I110 Hypertensive heart disease with heart failure: Secondary | ICD-10-CM | POA: Diagnosis not present

## 2019-12-13 DIAGNOSIS — R970 Elevated carcinoembryonic antigen [CEA]: Secondary | ICD-10-CM | POA: Diagnosis present

## 2019-12-13 DIAGNOSIS — Z801 Family history of malignant neoplasm of trachea, bronchus and lung: Secondary | ICD-10-CM | POA: Diagnosis not present

## 2019-12-13 DIAGNOSIS — M48061 Spinal stenosis, lumbar region without neurogenic claudication: Secondary | ICD-10-CM | POA: Diagnosis not present

## 2019-12-13 DIAGNOSIS — Z800 Family history of malignant neoplasm of digestive organs: Secondary | ICD-10-CM | POA: Insufficient documentation

## 2019-12-13 DIAGNOSIS — I700 Atherosclerosis of aorta: Secondary | ICD-10-CM | POA: Diagnosis not present

## 2019-12-13 DIAGNOSIS — R160 Hepatomegaly, not elsewhere classified: Secondary | ICD-10-CM

## 2019-12-13 DIAGNOSIS — G2581 Restless legs syndrome: Secondary | ICD-10-CM | POA: Diagnosis not present

## 2019-12-13 DIAGNOSIS — C787 Secondary malignant neoplasm of liver and intrahepatic bile duct: Secondary | ICD-10-CM | POA: Diagnosis not present

## 2019-12-13 DIAGNOSIS — Z87891 Personal history of nicotine dependence: Secondary | ICD-10-CM | POA: Insufficient documentation

## 2019-12-13 DIAGNOSIS — C7951 Secondary malignant neoplasm of bone: Secondary | ICD-10-CM | POA: Diagnosis not present

## 2019-12-13 DIAGNOSIS — Z8049 Family history of malignant neoplasm of other genital organs: Secondary | ICD-10-CM | POA: Insufficient documentation

## 2019-12-13 DIAGNOSIS — J439 Emphysema, unspecified: Secondary | ICD-10-CM | POA: Diagnosis not present

## 2019-12-13 DIAGNOSIS — I272 Pulmonary hypertension, unspecified: Secondary | ICD-10-CM | POA: Insufficient documentation

## 2019-12-13 DIAGNOSIS — I739 Peripheral vascular disease, unspecified: Secondary | ICD-10-CM | POA: Insufficient documentation

## 2019-12-13 DIAGNOSIS — E785 Hyperlipidemia, unspecified: Secondary | ICD-10-CM | POA: Diagnosis not present

## 2019-12-13 DIAGNOSIS — Z79899 Other long term (current) drug therapy: Secondary | ICD-10-CM | POA: Insufficient documentation

## 2019-12-13 DIAGNOSIS — I2510 Atherosclerotic heart disease of native coronary artery without angina pectoris: Secondary | ICD-10-CM | POA: Insufficient documentation

## 2019-12-13 DIAGNOSIS — E611 Iron deficiency: Secondary | ICD-10-CM | POA: Diagnosis not present

## 2019-12-13 DIAGNOSIS — C801 Malignant (primary) neoplasm, unspecified: Secondary | ICD-10-CM | POA: Insufficient documentation

## 2019-12-13 DIAGNOSIS — K7689 Other specified diseases of liver: Secondary | ICD-10-CM | POA: Diagnosis not present

## 2019-12-13 DIAGNOSIS — I509 Heart failure, unspecified: Secondary | ICD-10-CM | POA: Insufficient documentation

## 2019-12-13 DIAGNOSIS — N289 Disorder of kidney and ureter, unspecified: Secondary | ICD-10-CM | POA: Diagnosis not present

## 2019-12-13 DIAGNOSIS — J849 Interstitial pulmonary disease, unspecified: Secondary | ICD-10-CM | POA: Insufficient documentation

## 2019-12-13 LAB — CBC WITH DIFFERENTIAL/PLATELET
Abs Immature Granulocytes: 0.01 10*3/uL (ref 0.00–0.07)
Basophils Absolute: 0 10*3/uL (ref 0.0–0.1)
Basophils Relative: 1 %
Eosinophils Absolute: 0.2 10*3/uL (ref 0.0–0.5)
Eosinophils Relative: 3 %
HCT: 32.5 % — ABNORMAL LOW (ref 36.0–46.0)
Hemoglobin: 10.5 g/dL — ABNORMAL LOW (ref 12.0–15.0)
Immature Granulocytes: 0 %
Lymphocytes Relative: 10 %
Lymphs Abs: 0.7 10*3/uL (ref 0.7–4.0)
MCH: 30.7 pg (ref 26.0–34.0)
MCHC: 32.3 g/dL (ref 30.0–36.0)
MCV: 95 fL (ref 80.0–100.0)
Monocytes Absolute: 0.8 10*3/uL (ref 0.1–1.0)
Monocytes Relative: 11 %
Neutro Abs: 5.5 10*3/uL (ref 1.7–7.7)
Neutrophils Relative %: 75 %
Platelets: 277 10*3/uL (ref 150–400)
RBC: 3.42 MIL/uL — ABNORMAL LOW (ref 3.87–5.11)
RDW: 15.2 % (ref 11.5–15.5)
WBC: 7.2 10*3/uL (ref 4.0–10.5)
nRBC: 0 % (ref 0.0–0.2)

## 2019-12-13 LAB — PROTIME-INR
INR: 1.1 (ref 0.8–1.2)
Prothrombin Time: 13.3 seconds (ref 11.4–15.2)

## 2019-12-13 LAB — COMPREHENSIVE METABOLIC PANEL
ALT: 11 U/L (ref 0–44)
AST: 31 U/L (ref 15–41)
Albumin: 3 g/dL — ABNORMAL LOW (ref 3.5–5.0)
Alkaline Phosphatase: 102 U/L (ref 38–126)
Anion gap: 10 (ref 5–15)
BUN: 39 mg/dL — ABNORMAL HIGH (ref 8–23)
CO2: 28 mmol/L (ref 22–32)
Calcium: 9.8 mg/dL (ref 8.9–10.3)
Chloride: 101 mmol/L (ref 98–111)
Creatinine, Ser: 1.49 mg/dL — ABNORMAL HIGH (ref 0.44–1.00)
GFR, Estimated: 30 mL/min — ABNORMAL LOW (ref >60)
Glucose, Bld: 110 mg/dL — ABNORMAL HIGH (ref 70–99)
Potassium: 4.6 mmol/L (ref 3.5–5.1)
Sodium: 139 mmol/L (ref 135–145)
Total Bilirubin: 0.6 mg/dL (ref 0.3–1.2)
Total Protein: 6.7 g/dL (ref 6.5–8.1)

## 2019-12-13 MED ORDER — MIDAZOLAM HCL 2 MG/2ML IJ SOLN
INTRAMUSCULAR | Status: AC
  Filled 2019-12-13: qty 2

## 2019-12-13 MED ORDER — FENTANYL CITRATE (PF) 100 MCG/2ML IJ SOLN
INTRAMUSCULAR | Status: AC | PRN
Start: 2019-12-13 — End: 2019-12-13
  Administered 2019-12-13 (×2): 25 ug via INTRAVENOUS

## 2019-12-13 MED ORDER — FENTANYL CITRATE (PF) 100 MCG/2ML IJ SOLN
INTRAMUSCULAR | Status: DC
Start: 2019-12-13 — End: 2019-12-14
  Filled 2019-12-13: qty 2

## 2019-12-13 MED ORDER — MIDAZOLAM HCL 2 MG/2ML IJ SOLN
INTRAMUSCULAR | Status: AC | PRN
  Administered 2019-12-13 (×2): 0.5 mg via INTRAVENOUS

## 2019-12-13 MED ORDER — GELATIN ABSORBABLE 12-7 MM EX MISC
CUTANEOUS | Status: AC
  Filled 2019-12-13: qty 1

## 2019-12-13 MED ORDER — LIDOCAINE HCL 1 % IJ SOLN
INTRAMUSCULAR | Status: AC
  Filled 2019-12-13: qty 20

## 2019-12-13 MED ORDER — LIDOCAINE HCL (PF) 1 % IJ SOLN
INTRAMUSCULAR | Status: AC | PRN
  Administered 2019-12-13: 5 mL

## 2019-12-13 MED ORDER — SODIUM CHLORIDE 0.9 % IV SOLN: INTRAVENOUS | Status: DC

## 2019-12-13 NOTE — Consult Note (Signed)
Chief Complaint: Patient was seen in consultation today for image guided liver lesion biopsy  Referring Physician(s): Burton,Lacie K/Feng,Y  Supervising Physician: [name]  Patient Status: [facility] - Out-pt  History of Present Illness: Kristi Alexander is a 86 y.o. female with past medical history significant for CHF, hyperlipidemia, hypertension, lumbar stenosis, restless leg syndrome, peripheral arterial disease, interstitial lung disease, pulmonary hypertension, coronary artery disease, B12 and iron deficiency who presents now with anorexia, constipation, weight loss, recent abdominal pain, flank pain and imaging findings of left hepatic lobe masses.  She has normal AFP, elevated CEA.  She has no prior history of malignancy.  She presents today for image guided liver lesion/mass biopsy for further evaluation.  Past Medical History:  Diagnosis Date  . Abnormality of gait 11/22/2014  . Acute exacerbation of CHF (congestive heart failure) ([place]) 06/23/2018  . Arthritis   . Cyst of breast, right, benign solitary   . Dyslipidemia   . H/O: hysterectomy   . Heart murmur   . History of appendectomy   . History of lumbosacral spine surgery   . Hypertension   . Inguinal hernia   . Lumbar stenosis   . Restless leg syndrome     Past Surgical History:  Procedure Laterality Date  . ABDOMINAL HYSTERECTOMY    . APPENDECTOMY    . BOWEL RESECTION N/A 03/23/2013   Procedure: SMALL BOWEL RESECTION;  Surgeon: Gwenyth Ober, MD;  Location: [place];  Service: General;  Laterality: N/A;  . BREAST CYST EXCISION     LEFT  . ESOPHAGOGASTRODUODENOSCOPY N/A 04/01/2013   Procedure: ESOPHAGOGASTRODUODENOSCOPY (EGD);  Surgeon: Gwenyth Ober, MD;  Location: [place];  Service: General;  Laterality: N/A;  . HERNIA REPAIR    . LAPAROTOMY N/A 03/23/2013   Procedure: EXPLORATORY LAPAROTOMY;  Surgeon: Gwenyth Ober, MD;  Location: [place];  Service: General;  Laterality: N/A;  . LUMBAR  LAMINECTOMY/DECOMPRESSION MICRODISCECTOMY N/A 11/10/2016   Procedure: L4-5 DECOMPRESSION FOR RECURRENT STENOSIS;  Surgeon: Marybelle Killings, MD;  Location: [place];  Service: Orthopedics;  Laterality: N/A;  . PEG PLACEMENT N/A 04/01/2013   Procedure: PERCUTANEOUS ENDOSCOPIC GASTROSTOMY (PEG) PLACEMENT;  Surgeon: Gwenyth Ober, MD;  Location: [place];  Service: General;  Laterality: N/A;  . PEG TUBE REMOVAL    . TRACHEOSTOMY     feinstein  . TRACHEOSTOMY CLOSURE      Allergies: Penicillins and Pravastatin  Medications: Prior to Admission medications   Medication Sig Start Date End Date Taking? Authorizing Provider  acetaminophen (TYLENOL) 500 MG tablet Take 500 mg every 6 (six) hours as needed by mouth (pain).   Yes [provider]  atorvastatin (LIPITOR) 20 MG tablet Take 20 mg by mouth daily. 09/17/19  Yes [provider]  benzonatate (TESSALON) 100 MG capsule Take 1 capsule (100 mg total) by mouth 2 (two) times daily as needed for cough. 04/04/19  Yes Martyn Ehrich, NP  Calcium Carb-Cholecalciferol (CALCIUM-VITAMIN D) 600-400 MG-UNIT TABS Take 1 tablet by mouth once daily 09/09/19  Yes Iona Beard, MD  carvedilol (COREG) 6.25 MG tablet TAKE 1 TABLET BY MOUTH TWICE DAILY WITH A MEAL 10/13/19  Yes Andrew Au, MD  diclofenac Sodium (VOLTAREN) 1 % GEL SMARTSIG:2 Gram(s) Topical 4 Times Daily PRN 10/12/19  Yes [provider]  furosemide (LASIX) 40 MG tablet Take 40 mg by mouth daily. 10/21/19  Yes [provider]  hydrOXYzine (ATARAX/VISTARIL) 25 MG tablet TAKE 1 TABLET BY MOUTH ONCE DAILY AS NEEDED FOR  ITCHING 11/19/19  Yes Mannam, Praveen, MD  isosorbide mononitrate (IMDUR) 30 MG 24 hr tablet Take 1 tablet by mouth once daily 09/09/19  Yes Iona Beard, MD  Multiple Vitamin (MULTIVITAMIN WITH MINERALS) TABS tablet Take 1 tablet by mouth daily.   Yes [provider]  sacubitril-valsartan (ENTRESTO) 49-51 MG Take 1 tablet by mouth 2 (two) times  daily. 10/18/19  Yes Madalyn Rob, MD  spironolactone (ALDACTONE) 50 MG tablet Take 1/2 (one-half) tablet by mouth once daily 08/22/19  Yes Christian, Rylee, MD  fluticasone (FLONASE) 50 MCG/ACT nasal spray Place 1 spray into both nostrils daily as needed for allergies or rhinitis. 04/04/19   Martyn Ehrich, NP  loratadine (CLARITIN) 10 MG tablet Take 1 tablet (10 mg total) by mouth daily as needed for allergies. 08/25/19 08/24/20  Harvie Heck, MD     Family History  Problem Relation Age of Onset  . Heart disease Mother   . Diabetes Mother   . Heart attack Father   . Cancer Brother        lung CA  . Lung cancer Brother   . Arthritis Sister   . Osteoporosis Sister   . Stomach cancer Son   . Cancer Son        stomach CA  . Migraines Daughter   . Arthritis Daughter   . Cancer Maternal Uncle        lung  . Cancer Maternal Aunt        uterine    Social History   Socioeconomic History  . Marital status: Widowed    Spouse name: [name]   . Number of children: 2  . Years of education: Not on file  . Highest education level: Bachelor's degree (e.g., BA, AB, BS)  Occupational History  . Occupation: Retired  Tobacco Use  . Smoking status: Former Smoker    Packs/day: 0.25    Years: 40.00    Pack years: 10.00    Types: Cigarettes    Quit date: 02/25/2003    Years since quitting: 16.8  . Smokeless tobacco: Never Used  Vaping Use  . Vaping Use: Never used  Substance and Sexual Activity  . Alcohol use: Yes    Comment: About 2 drinks per night  . Drug use: No  . Sexual activity: Not on file  Other Topics Concern  . Not on file  Social History Narrative   Current Social History 06/28/2019        Patient lives with family daughter, [name] in a one level home. There are not steps up to the entrance the patient uses.       Patient's method of transportation is via daughter.      The highest level of education was Dietitian in [place].      The patient  currently retired.      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 [place], BSN, RN-BC  Social Determinants of Health   Financial Resource Strain:   . Difficulty of Paying Living Expenses: Not on file  Food Insecurity:   . Worried About Charity fundraiser in the Last Year: Not on file  . Ran Out of Food in the Last Year: Not on file  Transportation Needs:   . Lack of Transportation (Medical): Not on file  . Lack of Transportation (Non-Medical): Not on file  Physical Activity:   . Days of Exercise per Week: Not on file  . Minutes of Exercise per Session: Not on file  Stress:   . Feeling of Stress : Not on file  Social Connections:   . Frequency of Communication with Friends and Family: Not on file  . Frequency of Social Gatherings with Friends and Family: Not on file  . Attends Religious Services: Not on file  . Active Member of Clubs or Organizations: Not on file  . Attends Archivist Meetings: Not on file  . Marital Status: Not on file      Review of Systems see above; currently denies fever, headache, chest pain, worsening dyspnea, cough, nausea, vomiting or bleeding.  Vital Signs: BP 106/71   Pulse 63   Temp 98.5 F (36.9 C) (Oral)   Resp 16   SpO2 100%   Physical Exam awake, alert.  Chest clear to auscultation bilaterally.  Heart with regular rate and rhythm.  Abdomen soft, positive bowel sounds, currently nontender.  No lower extremity edema.  Imaging: CT Abdomen Pelvis Wo Contrast  Result Date:  11/15/2019 CLINICAL DATA:  Acute right flank pain. EXAM: CT ABDOMEN AND PELVIS WITHOUT CONTRAST TECHNIQUE: Multidetector CT imaging of the abdomen and pelvis was performed following the standard protocol without IV contrast. COMPARISON:  January 10, 2017. FINDINGS: Lower chest: No acute abnormality. Hepatobiliary: Large gallstone is noted in the fundus of the gallbladder. No biliary dilatation is noted. Stable small right hepatic cyst is noted. New 15 mm left hepatic low density is noted of uncertain etiology. Pancreas: Unremarkable. No pancreatic ductal dilatation or surrounding inflammatory changes. Spleen: Normal in size without focal abnormality. Adrenals/Urinary Tract: Stable left adrenal adenoma. Right adrenal gland appears normal. No hydronephrosis or renal obstruction is noted. No renal or ureteral calculi are noted. Urinary bladder is unremarkable. Stomach/Bowel: The stomach appears normal. There is no evidence of bowel obstruction or inflammation. Diverticulosis is noted throughout the colon. Large amount of stool is noted in the rectum concerning for impaction. Vascular/Lymphatic: Aortic atherosclerosis. No enlarged abdominal or pelvic lymph nodes. Reproductive: Status post hysterectomy. No adnexal masses. Other: No abdominal wall hernia or abnormality. No abdominopelvic ascites. Musculoskeletal: No fracture is seen. IMPRESSION: 1. 15 mm left hepatic low density is noted of uncertain etiology. MRI with and without gadolinium is recommended to evaluate for possible neoplasm. 2. Large gallstone is noted in the fundus of the gallbladder. 3. Stable left adrenal adenoma. 4. Diverticulosis is noted throughout the colon. 5. Large amount of stool is noted in the rectum concerning for impaction. 6. Aortic atherosclerosis. Aortic Atherosclerosis (ICD10-I70.0). Electronically Signed   By: Marijo Conception M.D.   On: 11/15/2019 15:33   MR Abdomen W Wo Contrast  Result Date: 11/24/2019 CLINICAL DATA:  Right-sided  abdominal pain for 2 weeks. Indeterminate liver lesion on recent CT. EXAM: MRI ABDOMEN WITHOUT AND WITH CONTRAST TECHNIQUE: Multiplanar multisequence MR imaging of the abdomen was performed both before and after the administration of intravenous contrast. CONTRAST:  82mL MULTIHANCE GADOBENATE DIMEGLUMINE 529 MG/ML IV SOLN  COMPARISON:  Noncontrast CT on 11/15/2019, and contrast enhanced CT on 01/10/2017 FINDINGS: Lower chest: No acute findings. Hepatobiliary: 2 tiny cysts are seen in the inferior right hepatic lobe. Three adjacent masses are seen in the left hepatic lobe, which measure 3.0 cm, 2.2 cm, and 1.7 cm in maximum diameter. These masses all have similar characteristics including mild T2 hyperintensity and mild central heterogeneous contrast enhancement seen on subtraction imaging. These have nonspecific characteristics, but are new since 2018. Hepatic metastases cannot be excluded. 2 cm gallstone is noted, however there is no evidence of cholecystitis or biliary ductal dilatation. Pancreas:  No mass or inflammatory changes. Spleen:  Within normal limits in size and appearance. Adrenals/Urinary Tract: Stable 1.6 cm left adrenal mass, consistent with benign adenoma. No renal masses identified. No evidence of hydronephrosis. Stomach/Bowel: Visualized portion unremarkable. Vascular/Lymphatic: No pathologically enlarged lymph nodes identified. No abdominal aortic aneurysm. Other:  None. Musculoskeletal:  No suspicious bone lesions identified. IMPRESSION: Three hypovascular masses in the left hepatic lobe, which have nonspecific characteristics, but are new since 2018. Recommend correlation with tumor markers, and consider tissue sampling and/or PET-CT. Stable benign left adrenal adenoma. Cholelithiasis. No radiographic evidence of cholecystitis or biliary ductal dilatation. Electronically Signed   By: Marlaine Hind M.D.   On: 11/24/2019 16:17   US Abdomen Limited RUQ  Result Date: 11/23/2019 CLINICAL DATA:   Right upper quadrant pain, concern for cholecystitis EXAM: ULTRASOUND ABDOMEN LIMITED RIGHT UPPER QUADRANT COMPARISON:  11/15/2019 CT without contrast FINDINGS: Gallbladder: Large echogenic shadowing gallstone measures up to 2.3 cm. Normal wall thickness measuring 1.1 mm. No Murphy's sign or pericholecystic fluid. No signs of cholecystitis. Common bile duct: Diameter: 5.7 mm Liver: Central left hepatic mixed echogenicity lesion noted measuring 3.5 x 2.4 x 3.9 cm. This remains indeterminate by ultrasound but does not have the typical appearance of hemangioma. Additional adjacent left hepatic hypoechoic lesion measures 1.1 x 0.9 x 1.1 cm. No definite right hepatic abnormality by ultrasound. No biliary dilatation. Portal vein is patent on color Doppler imaging with normal direction of blood flow towards the liver. Other: No free fluid or ascites IMPRESSION: Cholelithiasis with a large gallstone measures 2.3 cm. No signs of cholecystitis by ultrasound No biliary dilatation Indeterminate adjacent left hepatic lesions, 1 measuring 3.5 cm and a second measuring 1.1 cm. These appear new since 2018 but present on the noncontrast CT comparison of 11/15/2019. Recommend further evaluation with MRI without and with contrast. These results will be called to the ordering clinician or representative by the Radiologist Assistant, and communication documented in the PACS or [employer]. Electronically Signed   By: [name].  Shick M.D.   On: 11/23/2019 12:25    Labs:  CBC: Recent Labs    08/25/19 1408 11/22/19 1642 12/10/19 1229 12/13/19 1130  WBC 7.5 5.0 6.6 7.2  HGB 11.3 11.2* 10.9* 10.5*  HCT 34.4 35.2* 34.9* 32.5*  PLT 319 278 308 277    COAGS: Recent Labs    12/13/19 1130  INR 1.1    BMP: Recent Labs    08/25/19 1408 11/22/19 1642 12/10/19 1229 12/13/19 1130  NA 137 138 140 139  K 4.8 4.0 4.2 4.6  CL 99 103 103 101  CO2 24 26 28 28   GLUCOSE 107* 107* 114* 110*  BUN 29 16 23  39*  CALCIUM 10.1  9.9 10.5* 9.8  CREATININE 1.32* 1.12* 1.31* 1.49*  GFRNONAA 35* 43* 35* 30*  GFRAA 40* 49*  --   --     LIVER  FUNCTION TESTS: Recent Labs    11/22/19 1642 12/10/19 1229 12/13/19 1130  BILITOT 1.0 1.2 0.6  AST 25 28 31   ALT 13 13 11   ALKPHOS 92 108 102  PROT 6.5 6.8 6.7  ALBUMIN 3.3* 3.1* 3.0*    TUMOR MARKERS: No results for input(s): AFPTM, CEA, CA199, CHROMGRNA in the last 8760 hours.  Assessment and Plan: 86 y.o. female with past medical history significant for CHF, hyperlipidemia, hypertension, lumbar stenosis, restless leg syndrome, peripheral arterial disease, interstitial lung disease, pulmonary hypertension, mild renal insufficiency, coronary artery disease, B12 and iron deficiency who presents now with anorexia, constipation, weight loss, recent abdominal pain, flank pain and imaging findings of left hepatic lobe masses.  She has normal AFP, elevated CEA.  She has no prior history of malignancy.  She presents today for image guided liver lesion/mass biopsy for further evaluation.Risks and benefits of procedure was discussed with the patient  including, but not limited to bleeding, infection, damage to adjacent structures or low yield requiring additional tests.  All of the questions were answered and there is agreement to proceed.  Consent signed and in chart.     Thank you for this interesting consult.  I greatly enjoyed meeting Kristi Alexander and look forward to participating in their care.  A copy of this report was sent to the requesting provider on this date.  Electronically Signed: D. Rowe Robert, PA-C 12/13/2019, 12:15 PM   I spent a total of    25 minutes in face to face in clinical consultation, greater than 50% of which was counseling/coordinating care for image guided liver mass biopsy

## 2019-12-13 NOTE — Discharge Instructions (Signed)
Please call Interventional Radiology clinic [phone] with any questions or concerns.  You may remove your dressing and shower tomorrow.  Liver Biopsy, Care After These instructions give you information on caring for yourself after your procedure. Your doctor may also give you more specific instructions. Call your doctor if you have any problems or questions after your procedure. What can I expect after the procedure? After the procedure, it is common to have:  Pain and soreness where the biopsy was done.  Bruising around the area where the biopsy was done.  Sleepiness and be tired for a few days. Follow these instructions at home: Medicines  Take over-the-counter and prescription medicines only as told by your doctor.  If you were prescribed an antibiotic medicine, take it as told by your doctor. Do not stop taking the antibiotic even if you start to feel better.  Do not take medicines such as aspirin and ibuprofen. These medicines can thin your blood. Do not take these medicines unless your doctor tells you to take them.  If you are taking prescription pain medicine, take actions to prevent or treat constipation. Your doctor may recommend that you: ? Drink enough fluid to keep your pee (urine) clear or pale yellow. ? Take over-the-counter or prescription medicines. ? Eat foods that are high in fiber, such as fresh fruits and vegetables, whole grains, and beans. ? Limit foods that are high in fat and processed sugars, such as fried and sweet foods. Caring for your cut  Follow instructions from your doctor about how to take care of your cuts from surgery (incisions). Make sure you: ? Wash your hands with soap and water before you change your bandage (dressing). If you cannot use soap and water, use hand sanitizer. ? Change your bandage as told by your doctor. ? Leave stitches (sutures), skin glue, or skin tape (adhesive) strips in place. They may need to stay in place for 2 weeks  or longer. If tape strips get loose and curl up, you may trim the loose edges. Do not remove tape strips completely unless your doctor says it is okay.  Check your cuts every day for signs of infection. Check for: ? Redness, swelling, or more pain. ? Fluid or blood. ? Pus or a bad smell. ? Warmth.  Do not take baths, swim, or use a hot tub until your doctor says it is okay to do so. Activity   Rest at home for 1-2 days or as told by your doctor. ? Avoid sitting for a long time without moving. Get up to take short walks every 1-2 hours.  Return to your normal activities as told by your doctor. Ask what activities are safe for you.  Do not do these things in the first 24 hours: ? Drive. ? Use machinery. ? Take a bath or shower.  Do not lift more than 10 pounds (4.5 kg) or play contact sports for the first 2 weeks. General instructions   Do not drink alcohol in the first week after the procedure.  Have someone stay with you for at least 24 hours after the procedure.  Get your test results. Ask your doctor or the department that is doing the test: ? When will my results be ready? ? How will I get my results? ? What are my treatment options? ? What other tests do I need? ? What are my next steps?  Keep all follow-up visits as told by your doctor. This is important. Contact a doctor if:    A cut bleeds and leaves more than just a small spot of blood.  A cut is red, puffs up (swells), or hurts more than before.  Fluid or something else comes from a cut.  A cut smells bad.  You have a fever or chills. Get help right away if:  You have swelling, bloating, or pain in your belly (abdomen).  You get dizzy or faint.  You have a rash.  You feel sick to your stomach (nauseous) or throw up (vomit).  You have trouble breathing, feel short of breath, or feel faint.  Your chest hurts.  You have problems talking or seeing.  You have trouble with your balance or moving your  arms or legs. Summary  After the procedure, it is common to have pain, soreness, bruising, and tiredness.  Your doctor will tell you how to take care of yourself at home. Change your bandage, take your medicines, and limit your activities as told by your doctor.  Call your doctor if you have symptoms of infection. Get help right away if your belly swells, your cut bleeds a lot, or you have trouble talking or breathing. This information is not intended to replace advice given to you by your health care provider. Make sure you discuss any questions you have with your health care provider. Document Revised: 02/20/2017 Document Reviewed: 02/20/2017 Elsevier Patient Education  2020 Elsevier Inc.   Moderate Conscious Sedation, Adult, Care After These instructions provide you with information about caring for yourself after your procedure. Your health care provider may also give you more specific instructions. Your treatment has been planned according to current medical practices, but problems sometimes occur. Call your health care provider if you have any problems or questions after your procedure. What can I expect after the procedure? After your procedure, it is common:  To feel sleepy for several hours.  To feel clumsy and have poor balance for several hours.  To have poor judgment for several hours.  To vomit if you eat too soon. Follow these instructions at home: For at least 24 hours after the procedure:   Do not: ? Participate in activities where you could fall or become injured. ? Drive. ? Use heavy machinery. ? Drink alcohol. ? Take sleeping pills or medicines that cause drowsiness. ? Make important decisions or sign legal documents. ? Take care of children on your own.  Rest. Eating and drinking  Follow the diet recommended by your health care provider.  If you vomit: ? Drink water, juice, or soup when you can drink without vomiting. ? Make sure you have little or no  nausea before eating solid foods. General instructions  Have a responsible adult stay with you until you are awake and alert.  Take over-the-counter and prescription medicines only as told by your health care provider.  If you smoke, do not smoke without supervision.  Keep all follow-up visits as told by your health care provider. This is important. Contact a health care provider if:  You keep feeling nauseous or you keep vomiting.  You feel light-headed.  You develop a rash.  You have a fever. Get help right away if:  You have trouble breathing. This information is not intended to replace advice given to you by your health care provider. Make sure you discuss any questions you have with your health care provider. Document Revised: 01/23/2017 Document Reviewed: 06/02/2015 Elsevier Patient Education  2020 Elsevier Inc.  

## 2019-12-13 NOTE — Procedures (Signed)
Interventional Radiology Procedure Note  Procedure: US guided biopsy of liver mass, right liver Complications: None EBL: None Recommendations: - Bedrest 2 hours.   - Routine wound care - Follow up pathology - Advance diet   Signed,  [name], DO

## 2019-12-15 ENCOUNTER — Inpatient Hospital Stay: Payer: Medicare PPO | Admitting: Hematology

## 2019-12-16 ENCOUNTER — Inpatient Hospital Stay: Payer: Medicare PPO

## 2019-12-16 ENCOUNTER — Inpatient Hospital Stay: Payer: Medicare PPO | Admitting: Hematology

## 2019-12-16 ENCOUNTER — Other Ambulatory Visit: Payer: Self-pay

## 2019-12-16 ENCOUNTER — Encounter: Payer: Self-pay | Admitting: Hematology

## 2019-12-16 DIAGNOSIS — D649 Anemia, unspecified: Secondary | ICD-10-CM | POA: Diagnosis not present

## 2019-12-16 DIAGNOSIS — C787 Secondary malignant neoplasm of liver and intrahepatic bile duct: Secondary | ICD-10-CM | POA: Diagnosis not present

## 2019-12-16 DIAGNOSIS — E611 Iron deficiency: Secondary | ICD-10-CM | POA: Diagnosis not present

## 2019-12-16 DIAGNOSIS — C3490 Malignant neoplasm of unspecified part of unspecified bronchus or lung: Secondary | ICD-10-CM | POA: Diagnosis not present

## 2019-12-16 DIAGNOSIS — I509 Heart failure, unspecified: Secondary | ICD-10-CM | POA: Diagnosis not present

## 2019-12-16 DIAGNOSIS — K769 Liver disease, unspecified: Secondary | ICD-10-CM | POA: Diagnosis not present

## 2019-12-16 DIAGNOSIS — I2510 Atherosclerotic heart disease of native coronary artery without angina pectoris: Secondary | ICD-10-CM | POA: Diagnosis not present

## 2019-12-16 DIAGNOSIS — R634 Abnormal weight loss: Secondary | ICD-10-CM | POA: Diagnosis not present

## 2019-12-16 DIAGNOSIS — I110 Hypertensive heart disease with heart failure: Secondary | ICD-10-CM | POA: Diagnosis not present

## 2019-12-16 DIAGNOSIS — R970 Elevated carcinoembryonic antigen [CEA]: Secondary | ICD-10-CM | POA: Diagnosis not present

## 2019-12-16 DIAGNOSIS — N289 Disorder of kidney and ureter, unspecified: Secondary | ICD-10-CM | POA: Diagnosis not present

## 2019-12-16 LAB — SURGICAL PATHOLOGY

## 2019-12-16 MED ORDER — MIRTAZAPINE 7.5 MG PO TABS: 7.5000 mg | ORAL_TABLET | Freq: Every day | ORAL | 0 refills | Status: AC

## 2019-12-16 MED ORDER — TRAMADOL HCL 50 MG PO TABS
50.0000 mg | ORAL_TABLET | Freq: Four times a day (QID) | ORAL | 0 refills | Status: AC | PRN
Start: 2019-12-16 — End: ?

## 2019-12-16 NOTE — Progress Notes (Signed)
[place]   [phone] [phone]   Clinic Follow up Note   Patient Care Team: Mitzi Hansen, MD as PCP - General (Internal Medicine) Marshell Garfinkel, MD as Consulting Physician (Pulmonary Disease) Garrel Ridgel, DPM as Consulting Physician (Podiatry) Marybelle Killings, MD as Consulting Physician (Orthopedic Surgery) Jonnie Finner, RN as Oncology Nurse Navigator [name], NP as Nurse Practitioner (Nurse Practitioner) Truitt Merle, MD as Consulting Physician (Oncology)  Date of Service:  12/16/2019  CHIEF COMPLAINT: Squamous Cell Carcinoma metastatic to liver  SUMMARY OF ONCOLOGIC HISTORY: Oncology History Overview Note  Cancer Staging No matching staging information was found for the patient.    Non-small cell lung cancer metastatic to liver ([place])  11/15/2019 Imaging   CT AP 11/15/19  IMPRESSION: 1. 15 mm left hepatic low density is noted of uncertain etiology. MRI with and without gadolinium is recommended to evaluate for possible neoplasm. 2. Large gallstone is noted in the fundus of the gallbladder. 3. Stable left adrenal adenoma. 4. Diverticulosis is noted throughout the colon. 5. Large amount of stool is noted in the rectum concerning for impaction. 6. Aortic atherosclerosis. Aortic Atherosclerosis (ICD10-I70.0).   11/24/2019 Imaging   MRI Abdomen 11/24/19  IMPRESSION: Three hypovascular masses in the left hepatic lobe, which have nonspecific characteristics, but are new since 2018. Recommend correlation with tumor markers, and consider tissue sampling and/or PET-CT.   Stable benign left adrenal adenoma.   Cholelithiasis. No radiographic evidence of cholecystitis or biliary ductal dilatation.   12/13/2019 Imaging   CT Chest 12/13/19 IMPRESSION: 1. Findings suspicious for primary lung cancer with new LEFT upper lobe pulmonary nodule and bulky mediastinal adenopathy associated with bony and hepatic metastatic  disease. 2. Stable LEFT adrenal lesion compatible with adenoma on prior imaging. 3. Signs of pulmonary emphysema and interstitial lung disease as before. 4. Three-vessel coronary artery disease. 5. Aortic atherosclerosis.   These results will be called to the ordering clinician or representative by the Radiologist Assistant, and communication documented in the PACS or [employer].   Aortic Atherosclerosis (ICD10-I70.0) and Emphysema (ICD10-J43.9).   12/13/2019 Initial Biopsy   FINAL MICROSCOPIC DIAGNOSIS: 12/13/19  A. LIVER, BIOPSY:  - Metastatic squamous cell carcinoma.  See comment   COMMENT:   Immunohistochemical stains show that the tumor cells are positive for  p40 and p63 with focal labeling for CK 5/6.  Immunohistochemical stains  for TTF-1, Napsin-A, synaptophysin, CD56, CDX2, GATA3, ER, HepPar,  arginase, Glypican-3, CK7 and CK20 are negative.  This immunoprofile is  consistent with above interpretation.  Dr. Tresa Moore reviewed the case and  concurs with the diagnosis.  Dr. Burr Medico was notified on 12/16/2019.     12/16/2019 Initial Diagnosis   Non-small cell lung cancer metastatic to liver [facility])      INTERVAL HISTORY:  Kristi Alexander is here for a follow up. She presents to the clinic with family member. She notes she is more tired since her last visit. She is able to cook and do dishes mostly. She has to do tasks slowly. She lives with her daughter who can help her. She notes coughing some and denies SOB and abdominal pain. She notes she has chronic constipation which she takes medication for. She has low appetite and her weight has been trending down. She denies dysphagia.  She notes her history of smoking was less than a quarter pack a day, mostly socially and for 40 years. She notes she has arthritis mainly in her left shoulder  currently. Tylenol and Tramadol for pain.    REVIEW OF SYSTEMS:   Constitutional: Denies fevers, chills or abnormal weight loss Eyes:  Denies blurriness of vision Ears, nose, mouth, throat, and face: Denies mucositis or sore throat Respiratory: Denies dyspnea or wheezes (+) Occasional cough  Cardiovascular: Denies palpitation, chest discomfort or lower extremity swelling Gastrointestinal:  Denies nausea, heartburn  (+) Chronic constipation Skin: Denies abnormal skin rashes MSK: (+) arthritis, left shoulder pain  Lymphatics: Denies new lymphadenopathy or easy bruising Neurological:Denies numbness, tingling or new weaknesses Behavioral/Psych: Mood is stable, no new changes  All other systems were reviewed with the patient and are negative.  MEDICAL HISTORY:  Past Medical History:  Diagnosis Date  . Abnormality of gait 11/22/2014  . Acute exacerbation of CHF (congestive heart failure) ([place]) 06/23/2018  . Arthritis   . Cyst of breast, right, benign solitary   . Dyslipidemia   . H/O: hysterectomy   . Heart murmur   . History of appendectomy   . History of lumbosacral spine surgery   . Hypertension   . Inguinal hernia   . Lumbar stenosis   . Restless leg syndrome     SURGICAL HISTORY: Past Surgical History:  Procedure Laterality Date  . ABDOMINAL HYSTERECTOMY    . APPENDECTOMY    . BOWEL RESECTION N/A 03/23/2013   Procedure: SMALL BOWEL RESECTION;  Surgeon: Gwenyth Ober, MD;  Location: [place];  Service: General;  Laterality: N/A;  . BREAST CYST EXCISION     LEFT  . ESOPHAGOGASTRODUODENOSCOPY N/A 04/01/2013   Procedure: ESOPHAGOGASTRODUODENOSCOPY (EGD);  Surgeon: Gwenyth Ober, MD;  Location: [place];  Service: General;  Laterality: N/A;  . HERNIA REPAIR    . LAPAROTOMY N/A 03/23/2013   Procedure: EXPLORATORY LAPAROTOMY;  Surgeon: Gwenyth Ober, MD;  Location: [place];  Service: General;  Laterality: N/A;  . LUMBAR LAMINECTOMY/DECOMPRESSION MICRODISCECTOMY N/A 11/10/2016   Procedure: L4-5 DECOMPRESSION FOR RECURRENT STENOSIS;  Surgeon: Marybelle Killings, MD;  Location: [place];  Service: Orthopedics;  Laterality: N/A;    . PEG PLACEMENT N/A 04/01/2013   Procedure: PERCUTANEOUS ENDOSCOPIC GASTROSTOMY (PEG) PLACEMENT;  Surgeon: Gwenyth Ober, MD;  Location: [place];  Service: General;  Laterality: N/A;  . PEG TUBE REMOVAL    . TRACHEOSTOMY     feinstein  . TRACHEOSTOMY CLOSURE      I have reviewed the social history and family history with the patient and they are unchanged from previous note.  ALLERGIES:  is allergic to penicillins and pravastatin.  MEDICATIONS:  Current Outpatient Medications  Medication Sig Dispense Refill  . acetaminophen (TYLENOL) 500 MG tablet Take 500 mg every 6 (six) hours as needed by mouth (pain).    Marland Kitchen atorvastatin (LIPITOR) 20 MG tablet Take 20 mg by mouth daily.    . benzonatate (TESSALON) 100 MG capsule Take 1 capsule (100 mg total) by mouth 2 (two) times daily as needed for cough. 60 capsule 2  . Calcium Carb-Cholecalciferol (CALCIUM-VITAMIN D) 600-400 MG-UNIT TABS Take 1 tablet by mouth once daily 90 tablet 1  . carvedilol (COREG) 6.25 MG tablet TAKE 1 TABLET BY MOUTH TWICE DAILY WITH A MEAL 60 tablet 2  . diclofenac Sodium (VOLTAREN) 1 % GEL SMARTSIG:2 Gram(s) Topical 4 Times Daily PRN    . fluticasone (FLONASE) 50 MCG/ACT nasal spray Place 1 spray into both nostrils daily as needed for allergies or rhinitis. 16 g 2  . furosemide (LASIX) 40 MG tablet Take 40 mg by mouth daily.    Marland Kitchen  hydrOXYzine (ATARAX/VISTARIL) 25 MG tablet TAKE 1 TABLET BY MOUTH ONCE DAILY AS NEEDED FOR  ITCHING 90 tablet 0  . isosorbide mononitrate (IMDUR) 30 MG 24 hr tablet Take 1 tablet by mouth once daily 90 tablet 1  . loratadine (CLARITIN) 10 MG tablet Take 1 tablet (10 mg total) by mouth daily as needed for allergies. 30 tablet 0  . mirtazapine (REMERON) 7.5 MG tablet Take 1 tablet (7.5 mg total) by mouth at bedtime. 30 tablet 0  . Multiple Vitamin (MULTIVITAMIN WITH MINERALS) TABS tablet Take 1 tablet by mouth daily.    . sacubitril-valsartan (ENTRESTO) 49-51 MG Take 1 tablet by mouth 2 (two)  times daily. 60 tablet 3  . spironolactone (ALDACTONE) 50 MG tablet Take 1/2 (one-half) tablet by mouth once daily 90 tablet 0  . traMADol (ULTRAM) 50 MG tablet Take 1 tablet (50 mg total) by mouth every 6 (six) hours as needed. 30 tablet 0   No current facility-administered medications for this visit.    PHYSICAL EXAMINATION: ECOG PERFORMANCE STATUS: 3 - Symptomatic, >50% confined to bed  Vitals:   12/16/19 1413  BP: (!) 140/54  Pulse: 75  Resp: 17  Temp: (!) 97.5 F (36.4 C)  SpO2: 100%   Filed Weights   12/16/19 1413  Weight: 110 lb 11.2 oz (50.2 kg)    Due to COVID19 we will limit examination to appearance. Patient had no complaints.  GENERAL:alert, no distress and comfortable SKIN: skin color normal, no rashes or significant lesions EYES: normal, Conjunctiva are pink and non-injected, sclera clear  NEURO: alert & oriented x 3 with fluent speech   LABORATORY DATA:  I have reviewed the data as listed CBC Latest Ref Rng & Units 12/13/2019 12/10/2019 11/22/2019  WBC 4.0 - 10.5 K/uL 7.2 6.6 5.0  Hemoglobin 12.0 - 15.0 g/dL 10.5(L) 10.9(L) 11.2(L)  Hematocrit 36 - 46 % 32.5(L) 34.9(L) 35.2(L)  Platelets 150 - 400 K/uL 277 308 278     CMP Latest Ref Rng & Units 12/13/2019 12/10/2019 11/22/2019  Glucose 70 - 99 mg/dL 110(H) 114(H) 107(H)  BUN 8 - 23 mg/dL 39(H) 23 16  Creatinine 0.44 - 1.00 mg/dL 1.49(H) 1.31(H) 1.12(H)  Sodium 135 - 145 mmol/L 139 140 138  Potassium 3.5 - 5.1 mmol/L 4.6 4.2 4.0  Chloride 98 - 111 mmol/L 101 103 103  CO2 22 - 32 mmol/L _0 Calcium 8.9 - 10.3 mg/dL 9.8 10.5(H) 9.9  Total Protein 6.5 - 8.1 g/dL 6.7 6.8 6.5  Total Bilirubin 0.3 - 1.2 mg/dL 0.6 1.2 1.0  Alkaline Phos 38 - 126 U/L 102 108 92  AST 15 - 41 U/L _1 ALT 0 - 44 U/L _2 RADIOGRAPHIC STUDIES: I have personally reviewed the radiological images as listed and agreed with the findings in the report. No results found.   ASSESSMENT & PLAN:  Kristi Alexander is a 86 y.o. female with   1.metastatic squamous Cell Carcinoma of left lung to LNs, bone and liver -She initially presented with abdominal pain and weight loss, imaging showed 3 liver lesions up to 3 cm in the left hepatic lobe,no obvious primary site on 11/15/19 CT AP or 11/24/19 MRI abdomen. -We discussed her further workup in great detail. I personally reviewed her CT chest from 12/13/19 with pt and her daughter which showed known emphysema and a 2.9cm mass in her left lung suspicious for lung cancer primary along with bulky mediastinal  lymphadenopathy and bone met in right 6th rib in the chest.  -Her Liver biopsy from 12/13/19 showed squamous Cell carcinoma. I reviewed with her in great detail.  -This is consistent with metastatic squamous cell lung cancer -Given lung cancer can metastasize to the brain, I recommend Brain MRI. She is agreeable.  -She has limited history of smoking although squamous cell carcinoma is linked to smoking history. I discussed there may be gene mutations in her tumor, so I recommend [facility] genomic and molecular testing to determine if she is eligible for target therapy. She is interested.  -will send her biopsy tissue for PD-L1 expression also, to see if she is a candidate for immunotherapy -Her cancer is metastatic stage IV and overall her cancer is no longer curable at this stage.  Cytotoxic chemotherapy will be difficult for her given her advanced age and poor performance status. -She is not interested in chemo at this time, unless she has to. She rather do target or immunotherapy but is overall open to treatment.  -I will do a phone call with her after her workup.    2. Low appetite, weight loss, Fatigue  -Secondary to #1 -She has low appetite and has lead to increased weight loss. She has also had increased fatigue.  -I called in Mirtazapine 7.57m today (12/16/19) to help stimulate her appetite. She is interested.   -I also encouraged her to  continue Ensure TID  -I will refer her to dietician today    3.Co-morbidities:HTN, pulmonary HTN, CHF, CAD, B12 and iron deficiency, Arthritis, Elevated Cr  -Continue follow-up with PCP Dr. CDarrick Meigs cardio Dr. HTerrence Dupont pulmonary Dr. MVaughan Browner and Ortho Dr. YLorin Mercy-Co-morbidities appear to be well controlled at this time -She has diffuse arthritis, currently mainly in her left shoulder. I will fill her Tramadol today (12/16/19). I encouraged her to monitor her chronic constipation on pain medication.    Plan: -I called in Mirtazapine, Tramadol today  -Send dietician referral  -Brain MRI in 1-2 weeks  -Proceed with [facility] 360 liquid biopsy today  -will request PD-L1 on her biopsy sample  -Phone call with results, and finalize her treatment plan     No problem-specific Assessment & Plan notes found for this encounter.   Orders Placed This Encounter  Procedures  . MR Brain Wo Contrast    Standing Status:   Future    Standing Expiration Date:   12/15/2020    Order Specific Question:   What is the patient's sedation requirement?    Answer:   No Sedation    Order Specific Question:   Does the patient have a pacemaker or implanted devices?    Answer:   No    Order Specific Question:   Use SRS Protocol?    Answer:   No    Order Specific Question:   [facility]    [facility]  . [facility]   All questions were answered. The patient knows to call the clinic with any problems, questions or concerns. No barriers to learning was detected. The total time spent in the appointment was 40 minutes.     YTruitt Merle MD 12/16/2019   I, AJoslyn Devon am acting as scribe for YTruitt Merle MD.   I have reviewed the above documentation for accuracy and completeness, and I agree with the above.

## 2019-12-19 DIAGNOSIS — M25512 Pain in left shoulder: Secondary | ICD-10-CM | POA: Diagnosis not present

## 2019-12-19 DIAGNOSIS — M25511 Pain in right shoulder: Secondary | ICD-10-CM | POA: Diagnosis not present

## 2019-12-20 ENCOUNTER — Other Ambulatory Visit: Payer: Self-pay | Admitting: Hematology

## 2019-12-20 ENCOUNTER — Encounter: Payer: Medicare PPO | Admitting: Student

## 2019-12-21 ENCOUNTER — Other Ambulatory Visit: Payer: Self-pay

## 2019-12-25 DIAGNOSIS — C3412 Malignant neoplasm of upper lobe, left bronchus or lung: Secondary | ICD-10-CM | POA: Diagnosis not present

## 2019-12-26 ENCOUNTER — Other Ambulatory Visit: Payer: Self-pay

## 2019-12-26 ENCOUNTER — Ambulatory Visit (HOSPITAL_COMMUNITY)
Admission: RE | Admit: 2019-12-26 | Discharge: 2019-12-26 | Disposition: A | Discharge status: Home / Self Care | Payer: Medicare PPO | Source: Ambulatory Visit | Attending: Hematology | Admitting: Hematology

## 2019-12-26 DIAGNOSIS — C3490 Malignant neoplasm of unspecified part of unspecified bronchus or lung: Secondary | ICD-10-CM | POA: Insufficient documentation

## 2019-12-26 DIAGNOSIS — I6782 Cerebral ischemia: Secondary | ICD-10-CM | POA: Diagnosis not present

## 2019-12-27 ENCOUNTER — Encounter: Payer: Self-pay | Admitting: Hematology

## 2019-12-27 LAB — GUARDANT 360

## 2019-12-28 ENCOUNTER — Encounter (HOSPITAL_COMMUNITY): Payer: Self-pay | Admitting: Hematology

## 2019-12-29 ENCOUNTER — Ambulatory Visit: Payer: Medicare PPO | Admitting: Hematology

## 2019-12-30 ENCOUNTER — Other Ambulatory Visit: Payer: Self-pay

## 2019-12-30 ENCOUNTER — Encounter: Payer: Self-pay | Admitting: Hematology

## 2019-12-30 ENCOUNTER — Inpatient Hospital Stay: Payer: Medicare PPO | Attending: Nurse Practitioner | Admitting: Hematology

## 2019-12-30 VITALS — BP 106/55 | HR 75 | Temp 97.9°F | Resp 18 | Ht 61.0 in | Wt 105.0 lb

## 2019-12-30 DIAGNOSIS — C3412 Malignant neoplasm of upper lobe, left bronchus or lung: Secondary | ICD-10-CM | POA: Diagnosis not present

## 2019-12-30 DIAGNOSIS — I110 Hypertensive heart disease with heart failure: Secondary | ICD-10-CM | POA: Insufficient documentation

## 2019-12-30 DIAGNOSIS — I272 Pulmonary hypertension, unspecified: Secondary | ICD-10-CM | POA: Diagnosis not present

## 2019-12-30 DIAGNOSIS — I2510 Atherosclerotic heart disease of native coronary artery without angina pectoris: Secondary | ICD-10-CM | POA: Insufficient documentation

## 2019-12-30 DIAGNOSIS — I10 Essential (primary) hypertension: Secondary | ICD-10-CM | POA: Diagnosis not present

## 2019-12-30 DIAGNOSIS — C787 Secondary malignant neoplasm of liver and intrahepatic bile duct: Secondary | ICD-10-CM

## 2019-12-30 DIAGNOSIS — C3490 Malignant neoplasm of unspecified part of unspecified bronchus or lung: Secondary | ICD-10-CM | POA: Diagnosis not present

## 2019-12-30 DIAGNOSIS — R634 Abnormal weight loss: Secondary | ICD-10-CM | POA: Diagnosis not present

## 2019-12-30 DIAGNOSIS — I509 Heart failure, unspecified: Secondary | ICD-10-CM | POA: Diagnosis not present

## 2019-12-30 DIAGNOSIS — D518 Other vitamin B12 deficiency anemias: Secondary | ICD-10-CM

## 2019-12-30 MED ORDER — MEGESTROL ACETATE 625 MG/5ML PO SUSP: 625.0000 mg | Freq: Every day | ORAL | 0 refills | Status: AC

## 2019-12-30 MED ORDER — HYDROCODONE-ACETAMINOPHEN 10-300 MG/15ML PO SOLN: 7.5000 mL | Freq: Three times a day (TID) | ORAL | 0 refills | Status: AC | PRN

## 2019-12-30 NOTE — Progress Notes (Signed)
[place]   Telephone:([phone]) [phone] Fax:(336) [phone]   Clinic Follow up Note   Patient Care Team: Mitzi Hansen, MD as PCP - General (Internal Medicine) Marshell Garfinkel, MD as Consulting Physician (Pulmonary Disease) Garrel Ridgel, DPM as Consulting Physician (Podiatry) Marybelle Killings, MD as Consulting Physician (Orthopedic Surgery) Jonnie Finner, RN as Oncology Nurse Navigator [name], NP as Nurse Practitioner (Nurse Practitioner) Truitt Merle, MD as Consulting Physician (Oncology)  Date of Service:  12/30/2019  CHIEF COMPLAINT: Squamous Cell Carcinoma metastatic to liver  SUMMARY OF ONCOLOGIC HISTORY: Oncology History Overview Note  Cancer Staging No matching staging information was found for the patient.    Non-small cell lung cancer metastatic to liver ([place])  11/15/2019 Imaging   CT AP 11/15/19  IMPRESSION: 1. 15 mm left hepatic low density is noted of uncertain etiology. MRI with and without gadolinium is recommended to evaluate for possible neoplasm. 2. Large gallstone is noted in the fundus of the gallbladder. 3. Stable left adrenal adenoma. 4. Diverticulosis is noted throughout the colon. 5. Large amount of stool is noted in the rectum concerning for impaction. 6. Aortic atherosclerosis. Aortic Atherosclerosis (ICD10-I70.0).   11/24/2019 Imaging   MRI Abdomen 11/24/19  IMPRESSION: Three hypovascular masses in the left hepatic lobe, which have nonspecific characteristics, but are new since 2018. Recommend correlation with tumor markers, and consider tissue sampling and/or PET-CT.   Stable benign left adrenal adenoma.   Cholelithiasis. No radiographic evidence of cholecystitis or biliary ductal dilatation.   12/13/2019 Imaging   CT Chest 12/13/19 IMPRESSION: 1. Findings suspicious for primary lung cancer with new LEFT upper lobe pulmonary nodule and bulky mediastinal adenopathy associated with bony and hepatic metastatic  disease. 2. Stable LEFT adrenal lesion compatible with adenoma on prior imaging. 3. Signs of pulmonary emphysema and interstitial lung disease as before. 4. Three-vessel coronary artery disease. 5. Aortic atherosclerosis.   These results will be called to the ordering clinician or representative by the Radiologist Assistant, and communication documented in the PACS or [employer].   Aortic Atherosclerosis (ICD10-I70.0) and Emphysema (ICD10-J43.9).   12/13/2019 Initial Biopsy   FINAL MICROSCOPIC DIAGNOSIS: 12/13/19  A. LIVER, BIOPSY:  - Metastatic squamous cell carcinoma.  See comment   COMMENT:   Immunohistochemical stains show that the tumor cells are positive for  p40 and p63 with focal labeling for CK 5/6.  Immunohistochemical stains  for TTF-1, Napsin-A, synaptophysin, CD56, CDX2, GATA3, ER, HepPar,  arginase, Glypican-3, CK7 and CK20 are negative.  This immunoprofile is  consistent with above interpretation.  Dr. Tresa Moore reviewed the case and  concurs with the diagnosis.  Dr. Burr Medico was notified on 12/16/2019.     12/16/2019 Initial Diagnosis   Non-small cell lung cancer metastatic to liver [facility])   12/26/2019 Imaging   MRI Brain  IMPRESSION: Study ordered as a noncontrast exam. No evidence of intracranial metastatic disease. Tiny metastases can be inapparent without contrast administration, but there is no finding to suggest their presence.   Small-vessel ischemic changes throughout the brain as outlined above.   Question 5 mm meningioma of the anterior falx without mass-effect upon the brain.      CURRENT THERAPY:  Supportive Palliative Care  INTERVAL HISTORY:  [name] is here for a follow up. She presents to the clinic with her daughter. She feels weak and uses heating treatment on her LE. She also stretches her LE even when sitting or laying down. She can do her ADLs independently. She  lives with her daughter. She notes fatigue and pain in her  right shoulder. She uses tramadol for her pain.     REVIEW OF SYSTEMS:   Constitutional: Denies fevers, chills or abnormal weight loss (+) Fatigue  Eyes: Denies blurriness of vision Ears, nose, mouth, throat, and face: Denies mucositis or sore throat Respiratory: Denies cough, dyspnea or wheezes Cardiovascular: Denies palpitation, chest discomfort or lower extremity swelling Gastrointestinal:  Denies nausea, heartburn or change in bowel habits Skin: Denies abnormal skin rashes MSK: (+) Pain in left shoulder  Lymphatics: Denies new lymphadenopathy or easy bruising Neurological:Denies numbness, tingling or new weaknesses Behavioral/Psych: Mood is stable, no new changes  All other systems were reviewed with the patient and are negative.  MEDICAL HISTORY:  Past Medical History:  Diagnosis Date  . Abnormality of gait 11/22/2014  . Acute exacerbation of CHF (congestive heart failure) ([place]) 06/23/2018  . Arthritis   . Cyst of breast, right, benign solitary   . Dyslipidemia   . H/O: hysterectomy   . Heart murmur   . History of appendectomy   . History of lumbosacral spine surgery   . Hypertension   . Inguinal hernia   . Lumbar stenosis   . Restless leg syndrome     SURGICAL HISTORY: Past Surgical History:  Procedure Laterality Date  . ABDOMINAL HYSTERECTOMY    . APPENDECTOMY    . BOWEL RESECTION N/A 03/23/2013   Procedure: SMALL BOWEL RESECTION;  Surgeon: Gwenyth Ober, MD;  Location: [place];  Service: General;  Laterality: N/A;  . BREAST CYST EXCISION     LEFT  . ESOPHAGOGASTRODUODENOSCOPY N/A 04/01/2013   Procedure: ESOPHAGOGASTRODUODENOSCOPY (EGD);  Surgeon: Gwenyth Ober, MD;  Location: [place];  Service: General;  Laterality: N/A;  . HERNIA REPAIR    . LAPAROTOMY N/A 03/23/2013   Procedure: EXPLORATORY LAPAROTOMY;  Surgeon: Gwenyth Ober, MD;  Location: [place];  Service: General;  Laterality: N/A;  . LUMBAR LAMINECTOMY/DECOMPRESSION MICRODISCECTOMY N/A 11/10/2016    Procedure: L4-5 DECOMPRESSION FOR RECURRENT STENOSIS;  Surgeon: Marybelle Killings, MD;  Location: [place];  Service: Orthopedics;  Laterality: N/A;  . PEG PLACEMENT N/A 04/01/2013   Procedure: PERCUTANEOUS ENDOSCOPIC GASTROSTOMY (PEG) PLACEMENT;  Surgeon: Gwenyth Ober, MD;  Location: [place];  Service: General;  Laterality: N/A;  . PEG TUBE REMOVAL    . TRACHEOSTOMY     feinstein  . TRACHEOSTOMY CLOSURE      I have reviewed the social history and family history with the patient and they are unchanged from previous note.  ALLERGIES:  is allergic to penicillins and pravastatin.  MEDICATIONS:  Current Outpatient Medications  Medication Sig Dispense Refill  . acetaminophen (TYLENOL) 500 MG tablet Take 500 mg every 6 (six) hours as needed by mouth (pain).    Marland Kitchen atorvastatin (LIPITOR) 20 MG tablet Take 20 mg by mouth daily.    . benzonatate (TESSALON) 100 MG capsule Take 1 capsule (100 mg total) by mouth 2 (two) times daily as needed for cough. 60 capsule 2  . Calcium Carb-Cholecalciferol (CALCIUM-VITAMIN D) 600-400 MG-UNIT TABS Take 1 tablet by mouth once daily 90 tablet 1  . carvedilol (COREG) 6.25 MG tablet TAKE 1 TABLET BY MOUTH TWICE DAILY WITH A MEAL 60 tablet 2  . diclofenac Sodium (VOLTAREN) 1 % GEL SMARTSIG:2 Gram(s) Topical 4 Times Daily PRN    . fluticasone (FLONASE) 50 MCG/ACT nasal spray Place 1 spray into both nostrils daily as needed for allergies or rhinitis. 16 g 2  .  furosemide (LASIX) 40 MG tablet Take 40 mg by mouth daily.    Marland Kitchen HYDROcodone-Acetaminophen 10-300 MG/15ML SOLN Take 7.5 mLs by mouth every 8 (eight) hours as needed. 473 mL 0  . hydrOXYzine (ATARAX/VISTARIL) 25 MG tablet TAKE 1 TABLET BY MOUTH ONCE DAILY AS NEEDED FOR  ITCHING 90 tablet 0  . isosorbide mononitrate (IMDUR) 30 MG 24 hr tablet Take 1 tablet by mouth once daily 90 tablet 1  . loratadine (CLARITIN) 10 MG tablet Take 1 tablet (10 mg total) by mouth daily as needed for allergies. 30 tablet 0  . megestrol  (MEGACE ES) 625 MG/5ML suspension Take 5 mLs (625 mg total) by mouth daily. 150 mL 0  . mirtazapine (REMERON) 7.5 MG tablet Take 1 tablet (7.5 mg total) by mouth at bedtime. 30 tablet 0  . Multiple Vitamin (MULTIVITAMIN WITH MINERALS) TABS tablet Take 1 tablet by mouth daily.    . sacubitril-valsartan (ENTRESTO) 49-51 MG Take 1 tablet by mouth 2 (two) times daily. 60 tablet 3  . spironolactone (ALDACTONE) 50 MG tablet Take 1/2 (one-half) tablet by mouth once daily 90 tablet 0  . traMADol (ULTRAM) 50 MG tablet Take 1 tablet (50 mg total) by mouth every 6 (six) hours as needed. 30 tablet 0   No current facility-administered medications for this visit.    PHYSICAL EXAMINATION: ECOG PERFORMANCE STATUS: 3 - Symptomatic, >50% confined to bed  Vitals:   12/30/19 1148 12/30/19 1227  BP: (!) 70/44 (!) 106/55  Pulse: 75   Resp: 18   Temp: 97.9 F (36.6 C)   SpO2: 100%    Filed Weights   12/30/19 1148  Weight: 105 lb (47.6 kg)    Due to COVID19 we will limit examination to appearance. Patient had no complaints.  GENERAL:alert, no distress and comfortable SKIN: skin color normal, no rashes or significant lesions EYES: normal, Conjunctiva are pink and non-injected, sclera clear  NEURO: alert & oriented x 3 with fluent speech   LABORATORY DATA:  I have reviewed the data as listed CBC Latest Ref Rng & Units 12/13/2019 12/10/2019 11/22/2019  WBC 4.0 - 10.5 K/uL 7.2 6.6 5.0  Hemoglobin 12.0 - 15.0 g/dL 10.5(L) 10.9(L) 11.2(L)  Hematocrit 36 - 46 % 32.5(L) 34.9(L) 35.2(L)  Platelets 150 - 400 K/uL 277 308 278     CMP Latest Ref Rng & Units 12/13/2019 12/10/2019 11/22/2019  Glucose 70 - 99 mg/dL 110(H) 114(H) 107(H)  BUN 8 - 23 mg/dL 39(H) 23 16  Creatinine 0.44 - 1.00 mg/dL 1.49(H) 1.31(H) 1.12(H)  Sodium 135 - 145 mmol/L 139 140 138  Potassium 3.5 - 5.1 mmol/L 4.6 4.2 4.0  Chloride 98 - 111 mmol/L 101 103 103  CO2 22 - 32 mmol/L _0 Calcium 8.9 - 10.3 mg/dL 9.8 10.5(H) 9.9    Total Protein 6.5 - 8.1 g/dL 6.7 6.8 6.5  Total Bilirubin 0.3 - 1.2 mg/dL 0.6 1.2 1.0  Alkaline Phos 38 - 126 U/L 102 108 92  AST 15 - 41 U/L _1 ALT 0 - 44 U/L _2 RADIOGRAPHIC STUDIES: I have personally reviewed the radiological images as listed and agreed with the findings in the report. No results found.   ASSESSMENT & PLAN:  Kristi Alexander is a 86 y.o. female with    1.Metastatic squamous Cell Carcinoma of left lung to LNs, bone and liver -She initially presented with abdominal pain and weight loss, imaging showed 3 liver lesions  up to 3 cm in the left hepatic lobe,no obvious primary site on 11/15/19 CT AP or 11/24/19 MRI abdomen. -Her CT chest from 12/13/19 showed known emphysema and a 2.9cm mass in her left lung suspicious for lung cancer primary along with bulky mediastinal lymphadenopathy and bone met in right 6th rib in the chest.  -Her Liver biopsy from 12/13/19 showed squamous Cell carcinoma. This is consistent with metastatic squamous cell lung cancer. Her 12/26/19 MRI brain was negative for malignancy. I reviewed with pt today  -I reviewed her Guardian 360 which did not have any targetable mutations.  -her tumor PD-L1 was only 1% (+) which predicts low response to immunotherapy  -Given her metastatic disease, advanced age, decreased performance status and comorbidities, I do not recommend chemotherapy as she would not tolerable.   -I discussed the option of Palliative and Hospice care and reviewed the benefit and limits with pt and her daughter in detail today. After a lengthy discussion, she is interested in consulting with them and we made referral today. I will continue to be her MD when she is under hospice care    2. Low appetite, weight loss, Fatigue  -Secondary to #1 -She has low appetite and has lead to increased weight loss. She has also had increased fatigue.  -She tried Mirtazapine with limited benefit. I will call in Megace (12/30/19).   -Continue to f/u with dietician.  -She tries to move her legs in bed or when sitting. I recommend she ambulate as well. I also encouraged her to eat and drink more. Continue to f/u with Dietician.    3.Co-morbidities:HTN, pulmonary HTN, CHF, CAD, B12 and iron deficiency, Arthritis, Elevated Cr  -Continue follow-up with PCP Dr. Darrick Meigs, cardio Dr. Terrence Dupont, pulmonary Dr. Vaughan Browner, and Ortho Dr. Lorin Mercy -Co-morbidities appear to be well controlled at this time -She has diffuse arthritis, currently mainly in her left shoulder. She is currently on Tramadol which has limited help. I will call in Hydrocodone today (12/30/19). I advised her not to take it at the same time as Tramadol and she should take stool softener to combat constipation.  -Her BP is 70/44 today, repeated BP was normal    4. Goal of care discussion, DNR -We again discussed the incurable nature of her cancer, and the overall poor prognosis, especially since she is not on treatment. -The patient understands the goal of care is palliative and to manage her quality of life.  -I recommend DNR/DNI, she agreed after detailed discussion    Plan: -I called in Megace and hydrocodone  -[place] referral today -I will remain to be her MD when she is under hospice care    No problem-specific Assessment & Plan notes found for this encounter.   Orders Placed This Encounter  Procedures  . Ambulatory referral to Hospice    Referral Priority:   Routine    Referral Type:   Consultation    Referral Reason:   Symptom Managment    Requested Specialty:   Hospice Services    Number of Visits Requested:   1   All questions were answered. The patient knows to call the clinic with any problems, questions or concerns. No barriers to learning was detected. The total time spent in the appointment was 40 minutes.     Truitt Merle, MD 12/30/2019   I, Joslyn Devon, am acting as scribe for Truitt Merle, MD.   I have reviewed the above  documentation for accuracy and completeness, and I agree with the  above.

## 2020-01-23 ENCOUNTER — Telehealth: Payer: Self-pay

## 2020-01-23 NOTE — Telephone Encounter (Signed)
We received fax notification from [place] of Ms Mccranie's passing on Feb 19, 2020

## 2020-01-25 DEATH — deceased
